# Patient Record
Sex: Male | Born: 1950 | Race: Black or African American | Hispanic: No | Marital: Married | Smoking: Former smoker
Health system: Southern US, Community
[De-identification: ages and names within clinical notes are randomized; demographics above are authoritative.]

## PROBLEM LIST (undated history)

## (undated) DIAGNOSIS — I10 Essential (primary) hypertension: Secondary | ICD-10-CM

## (undated) DIAGNOSIS — E119 Type 2 diabetes mellitus without complications: Secondary | ICD-10-CM

## (undated) DIAGNOSIS — I2510 Atherosclerotic heart disease of native coronary artery without angina pectoris: Secondary | ICD-10-CM

## (undated) DIAGNOSIS — E7800 Pure hypercholesterolemia, unspecified: Secondary | ICD-10-CM

## (undated) DIAGNOSIS — I219 Acute myocardial infarction, unspecified: Secondary | ICD-10-CM

## (undated) DIAGNOSIS — N289 Disorder of kidney and ureter, unspecified: Secondary | ICD-10-CM

## (undated) HISTORY — DX: Acute myocardial infarction, unspecified: I21.9

## (undated) HISTORY — PX: CORONARY ANGIOPLASTY WITH STENT PLACEMENT: SHX49

---

## 2003-08-01 HISTORY — PX: CORONARY ANGIOPLASTY WITH STENT PLACEMENT: SHX49

## 2017-04-21 ENCOUNTER — Emergency Department (HOSPITAL_COMMUNITY): Payer: Medicare Other

## 2017-04-21 ENCOUNTER — Other Ambulatory Visit: Payer: Self-pay

## 2017-04-21 ENCOUNTER — Emergency Department (HOSPITAL_COMMUNITY)
Admission: EM | Admit: 2017-04-21 | Discharge: 2017-04-21 | Disposition: A | Discharge status: Home / Self Care | Payer: Medicare Other | Attending: Emergency Medicine | Admitting: Emergency Medicine

## 2017-04-21 ENCOUNTER — Encounter (HOSPITAL_COMMUNITY): Payer: Self-pay | Admitting: Emergency Medicine

## 2017-04-21 DIAGNOSIS — R51 Headache: Secondary | ICD-10-CM | POA: Insufficient documentation

## 2017-04-21 DIAGNOSIS — R109 Unspecified abdominal pain: Secondary | ICD-10-CM | POA: Diagnosis not present

## 2017-04-21 DIAGNOSIS — E119 Type 2 diabetes mellitus without complications: Secondary | ICD-10-CM | POA: Diagnosis not present

## 2017-04-21 DIAGNOSIS — M549 Dorsalgia, unspecified: Secondary | ICD-10-CM | POA: Diagnosis present

## 2017-04-21 DIAGNOSIS — Z79899 Other long term (current) drug therapy: Secondary | ICD-10-CM | POA: Insufficient documentation

## 2017-04-21 DIAGNOSIS — I10 Essential (primary) hypertension: Secondary | ICD-10-CM | POA: Insufficient documentation

## 2017-04-21 DIAGNOSIS — Z7984 Long term (current) use of oral hypoglycemic drugs: Secondary | ICD-10-CM | POA: Diagnosis not present

## 2017-04-21 DIAGNOSIS — M545 Low back pain: Secondary | ICD-10-CM | POA: Insufficient documentation

## 2017-04-21 HISTORY — DX: Pure hypercholesterolemia, unspecified: E78.00

## 2017-04-21 HISTORY — DX: Essential (primary) hypertension: I10

## 2017-04-21 HISTORY — DX: Type 2 diabetes mellitus without complications: E11.9

## 2017-04-21 LAB — CBC
HCT: 45 % (ref 39.0–52.0)
Hemoglobin: 14.6 g/dL (ref 13.0–17.0)
MCH: 28.7 pg (ref 26.0–34.0)
MCHC: 32.4 g/dL (ref 30.0–36.0)
MCV: 88.6 fL (ref 78.0–100.0)
PLATELETS: 216 10*3/uL (ref 150–400)
RBC: 5.08 MIL/uL (ref 4.22–5.81)
RDW: 12.7 % (ref 11.5–15.5)
WBC: 13 10*3/uL — AB (ref 4.0–10.5)

## 2017-04-21 LAB — COMPREHENSIVE METABOLIC PANEL
ALT: 18 U/L (ref 17–63)
AST: 17 U/L (ref 15–41)
Albumin: 3.8 g/dL (ref 3.5–5.0)
Alkaline Phosphatase: 61 U/L (ref 38–126)
Anion gap: 13 (ref 5–15)
BUN: 17 mg/dL (ref 6–20)
CHLORIDE: 99 mmol/L — AB (ref 101–111)
CO2: 23 mmol/L (ref 22–32)
CREATININE: 0.94 mg/dL (ref 0.61–1.24)
Calcium: 9.6 mg/dL (ref 8.9–10.3)
Glucose, Bld: 388 mg/dL — ABNORMAL HIGH (ref 65–99)
Potassium: 4 mmol/L (ref 3.5–5.1)
Sodium: 135 mmol/L (ref 135–145)
Total Bilirubin: 0.9 mg/dL (ref 0.3–1.2)
Total Protein: 7.3 g/dL (ref 6.5–8.1)

## 2017-04-21 LAB — URINALYSIS, ROUTINE W REFLEX MICROSCOPIC
BILIRUBIN URINE: NEGATIVE
Bacteria, UA: NONE SEEN
KETONES UR: NEGATIVE mg/dL
LEUKOCYTES UA: NEGATIVE
Nitrite: NEGATIVE
PH: 5 (ref 5.0–8.0)
Protein, ur: 100 mg/dL — AB
RBC / HPF: NONE SEEN RBC/hpf (ref 0–5)
SQUAMOUS EPITHELIAL / LPF: NONE SEEN
Specific Gravity, Urine: 1.029 (ref 1.005–1.030)
WBC, UA: NONE SEEN WBC/hpf (ref 0–5)

## 2017-04-21 LAB — LIPASE, BLOOD: LIPASE: 40 U/L (ref 11–51)

## 2017-04-21 MED ORDER — AMOXICILLIN-POT CLAVULANATE 875-125 MG PO TABS: 1.0000 | ORAL_TABLET | Freq: Two times a day (BID) | ORAL | 0 refills | Status: DC

## 2017-04-21 MED ORDER — SODIUM CHLORIDE 0.9 % IV BOLUS (SEPSIS)
1000.0000 mL | Freq: Once | INTRAVENOUS | Status: AC
  Administered 2017-04-21: 1000 mL via INTRAVENOUS

## 2017-04-21 MED ORDER — ACETAMINOPHEN 325 MG PO TABS
650.0000 mg | ORAL_TABLET | Freq: Once | ORAL | Status: AC
  Administered 2017-04-21: 650 mg via ORAL
  Filled 2017-04-21: qty 2

## 2017-04-21 MED ORDER — HYDROCODONE-ACETAMINOPHEN 5-325 MG PO TABS: 1.0000 | ORAL_TABLET | Freq: Four times a day (QID) | ORAL | 0 refills | Status: DC | PRN

## 2017-04-21 MED ORDER — METRONIDAZOLE IN NACL 5-0.79 MG/ML-% IV SOLN
500.0000 mg | Freq: Once | INTRAVENOUS | Status: AC
  Administered 2017-04-21: 500 mg via INTRAVENOUS
  Filled 2017-04-21: qty 100

## 2017-04-21 MED ORDER — CIPROFLOXACIN IN D5W 400 MG/200ML IV SOLN
400.0000 mg | Freq: Once | INTRAVENOUS | Status: AC
  Administered 2017-04-21: 400 mg via INTRAVENOUS
  Filled 2017-04-21: qty 200

## 2017-04-21 MED ORDER — IOPAMIDOL (ISOVUE-300) INJECTION 61%
INTRAVENOUS | Status: AC
  Administered 2017-04-21: 100 mL
  Filled 2017-04-21: qty 100

## 2017-04-21 NOTE — ED Provider Notes (Signed)
[facility] Provider Note   CSN: [id number] Arrival date & time: 04/21/17  0754     History   Chief Complaint Chief Complaint  Patient presents with  . Back Pain  . Abdominal Pain  . Headache    HPI [name] is a 66 y.o. male.  The history is provided by the patient.  Back Pain   This is a new problem. The current episode started more than 1 week ago. The problem occurs constantly. The problem has been gradually worsening. The pain is associated with no known injury. The pain is mild. Associated symptoms include headaches and abdominal pain. He has tried nothing for the symptoms. The treatment provided no relief.  Abdominal Pain   Associated symptoms include headaches.  Headache      Past Medical History:  Diagnosis Date  . Diabetes mellitus without complication ([place])   . Hypercholesterolemia   . Hypertension     There are no active problems to display for this patient.   History reviewed. No pertinent surgical history.     Home Medications    Prior to Admission medications   Medication Sig Start Date End Date Taking? Authorizing Provider  glipiZIDE (GLUCOTROL XL) 5 MG 24 hr tablet Take 5 mg by mouth daily. 04/07/17  Yes [provider]  metFORMIN (GLUCOPHAGE) 1000 MG tablet Take 1,000 mg by mouth 2 (two) times daily. 02/17/17  Yes [provider]  omeprazole (PRILOSEC) 20 MG capsule Take 20 mg by mouth daily as needed (acid reflux).   Yes [provider]  quinapril (ACCUPRIL) 40 MG tablet Take 40 mg by mouth 2 (two) times daily. 04/07/17  Yes [provider]  rosuvastatin (CRESTOR) 20 MG tablet Take 20 mg by mouth daily. 04/07/17  Yes [provider]  amoxicillin-clavulanate (AUGMENTIN) 875-125 MG tablet Take 1 tablet by mouth 2 (two) times daily. One po bid x 7 days 04/21/17   Zakaiya Lares, Corene Cornea, MD  HYDROcodone-acetaminophen (NORCO/VICODIN) 5-325 MG tablet Take 1-2 tablets by  mouth every 6 (six) hours as needed for severe pain. 04/21/17   Lev Cervone, Corene Cornea, MD    Family History No family history on file.  Social History Social History   Tobacco Use  . Smoking status: Never Smoker  . Smokeless tobacco: Never Used  Substance Use Topics  . Alcohol use: No    Frequency: Never  . Drug use: No     Allergies   Patient has no allergy information on record.   Review of Systems Review of Systems  Gastrointestinal: Positive for abdominal pain.  Musculoskeletal: Positive for back pain.  Neurological: Positive for headaches.  All other systems reviewed and are negative.    Physical Exam Updated Vital Signs BP (!) 159/83   Pulse 87   Temp (!) 100.4 F (38 C) (Rectal)   Resp 16   Ht 5\' 4"  (1.626 m)   Wt 86.2 kg (190 lb)   SpO2 98%   BMI 32.61 kg/m   Physical Exam  Constitutional: He appears well-developed and well-nourished.  HENT:  Head: Normocephalic and atraumatic.  Eyes: EOM are normal. Pupils are equal, round, and reactive to light.  Neck: Normal range of motion.  Cardiovascular: Normal rate.  Pulmonary/Chest: Effort normal. No respiratory distress.  Abdominal: Normal appearance. He exhibits no distension. There is tenderness in the right lower quadrant, suprapubic area and left lower quadrant. There is CVA tenderness. There is no rigidity, no rebound and no guarding.  Musculoskeletal: Normal range of  motion.  Neurological: He is alert.  Skin: Skin is warm and dry.  Nursing note and vitals reviewed.    ED Treatments / Results  Labs (all labs ordered are listed, but only abnormal results are displayed) Labs Reviewed  COMPREHENSIVE METABOLIC PANEL - Abnormal; Notable for the following components:      Result Value   Chloride 99 (*)    Glucose, Bld 388 (*)    All other components within normal limits  CBC - Abnormal; Notable for the following components:   WBC 13.0 (*)    All other components within normal limits  URINALYSIS,  ROUTINE W REFLEX MICROSCOPIC - Abnormal; Notable for the following components:   Color, Urine STRAW (*)    Glucose, UA >=500 (*)    Hgb urine dipstick SMALL (*)    Protein, ur 100 (*)    All other components within normal limits  LIPASE, BLOOD    EKG  EKG Interpretation None       Radiology Ct Abdomen Pelvis W Contrast  Result Date: 04/21/2017 CLINICAL DATA:  Back pain, abdominal pain and headache EXAM: CT ABDOMEN AND PELVIS WITH CONTRAST TECHNIQUE: Multidetector CT imaging of the abdomen and pelvis was performed using the standard protocol following bolus administration of intravenous contrast. CONTRAST:  128mL ISOVUE-300 IOPAMIDOL (ISOVUE-300) INJECTION 61% COMPARISON:  None. FINDINGS: Lower chest: No pulmonary nodules or pleural effusion. No visible pericardial effusion. Hepatobiliary: Normal hepatic contours and density. No visible biliary dilatation. Normal gallbladder. Pancreas: Normal contours without ductal dilatation. No peripancreatic fluid collection. Spleen: Normal. Adrenals/Urinary Tract: --Adrenal glands: Normal. --Right kidney/ureter: No hydronephrosis or perinephric stranding. No nephrolithiasis. No obstructing ureteral stones. --Left kidney/ureter: No hydronephrosis or perinephric stranding. No nephrolithiasis. No obstructing ureteral stones. --Urinary bladder: Unremarkable. Stomach/Bowel: --Stomach/Duodenum: No hiatal hernia or other gastric abnormality. Normal duodenal course and caliber. --Small bowel: No dilatation or inflammation. --Colon: No focal abnormality. --Appendix: Normal. Vascular/Lymphatic: Atherosclerotic calcification is present within the non-aneurysmal abdominal aorta, without hemodynamically significant stenosis. No abdominal or pelvic lymphadenopathy. Reproductive: Normal prostate and seminal vesicles. Musculoskeletal. No bony spinal canal stenosis or focal osseous abnormality. Other: Small fat-containing right inguinal hernia. IMPRESSION: 1. No acute  abnormality of the abdomen or pelvis. 2.  Aortic Atherosclerosis (ICD10-I70.0). Electronically Signed   By: Ulyses Jarred M.D.   On: 04/21/2017 13:54    Procedures Procedures (including critical care time)  Medications Ordered in ED Medications  sodium chloride 0.9 % bolus 1,000 mL (0 mLs Intravenous Stopped 04/21/17 1223)  acetaminophen (TYLENOL) tablet 650 mg (650 mg Oral Given 04/21/17 1125)  ciprofloxacin (CIPRO) IVPB 400 mg (0 mg Intravenous Stopped 04/21/17 1225)  metroNIDAZOLE (FLAGYL) IVPB 500 mg (0 mg Intravenous Stopped 04/21/17 1224)  iopamidol (ISOVUE-300) 61 % injection (100 mLs  Contrast Given 04/21/17 1334)     Initial Impression / Assessment and Plan / ED Course  I have reviewed the triage vital signs and the nursing notes.  Pertinent labs & imaging results that were available during my care of the patient were reviewed by me and considered in my medical decision making (see chart for details).    66 year old male with 2-3 weeks of progressively worsening back pain abdominal pain.  Intermittent headaches which does not seem to be his main complaint here today.  He states he has right flank pain that radiates around to his abdomen.  Also notes that he has a protrusion in his abdomen when he walks or when he sits up.  On exam appears that he actually has  diastases recti no evidence of hernia however CT scan was done to evaluate since he also had a low-grade fever of 100.4.  Antibiotics given and will continue those at home as well if no no acute cause of his fever was identified.  He will need to follow-up with primary doctor.  He will also return here if any worsening symptoms or not improving.  Consider possible osteomyelitis, discitis, epidural abscess, or transverse myelitis but patient does not have any neurologic changes does not really have midline back pain however if his symptoms are improving he may need a MRI of his back to evaluate for these further but no indication  for that at this time.  Final Clinical Impressions(s) / ED Diagnoses   Final diagnoses:  Low back pain, unspecified back pain laterality, unspecified chronicity, with sciatica presence unspecified    ED Discharge Orders        Ordered    amoxicillin-clavulanate (AUGMENTIN) 875-125 MG tablet  2 times daily     04/21/17 1514    HYDROcodone-acetaminophen (NORCO/VICODIN) 5-325 MG tablet  Every 6 hours PRN     04/21/17 1515       Kaisen Ackers, Corene Cornea, MD 04/21/17 1652

## 2017-04-21 NOTE — ED Triage Notes (Signed)
Pt. Stated, ive had back pain, stomach pain like a knot in there and a headache for 2 weeks.

## 2017-04-21 NOTE — ED Notes (Signed)
Pt called out c/o "rash and itching" to RFA above his IV while receiving both antibiotics. RN went to assess and saw small bumps but no discoloration present. Antibiotics already completed at this point. Disconnected from pump and line flushed with saline. Denies any sob or throat itching, sats 99% on RA. Will notify MD.

## 2017-06-07 ENCOUNTER — Encounter: Payer: Self-pay | Admitting: Family Medicine

## 2017-06-07 ENCOUNTER — Ambulatory Visit (INDEPENDENT_AMBULATORY_CARE_PROVIDER_SITE_OTHER): Payer: Medicare Other | Admitting: Family Medicine

## 2017-06-07 ENCOUNTER — Other Ambulatory Visit: Payer: Self-pay

## 2017-06-07 VITALS — BP 158/90 | HR 100 | Temp 98.6°F | Ht 64.0 in | Wt 190.0 lb

## 2017-06-07 DIAGNOSIS — E7800 Pure hypercholesterolemia, unspecified: Secondary | ICD-10-CM | POA: Diagnosis not present

## 2017-06-07 DIAGNOSIS — Z955 Presence of coronary angioplasty implant and graft: Secondary | ICD-10-CM | POA: Diagnosis not present

## 2017-06-07 DIAGNOSIS — E119 Type 2 diabetes mellitus without complications: Secondary | ICD-10-CM | POA: Diagnosis not present

## 2017-06-07 DIAGNOSIS — I10 Essential (primary) hypertension: Secondary | ICD-10-CM | POA: Diagnosis not present

## 2017-06-07 NOTE — Progress Notes (Signed)
Subjective:    Patient ID: [name], male    DOB: 12-08-1950, 67 y.o.   MRN: [id number]   CC: new patient encounter  HPI:  HTN: Quinapril 40 mg BID- does not believe this is working as well because he has had higher pressures once switched to generic medications.  - no HA, change in vision  HLD: Rosuvastatin 20mg  daily Reports cholesterol as ok at last office visit but was high previously  Diabetes:  Last A1c unknown- does not think he has ever had one- says he has had diabetes for maybe 20 years, he is unsure  Taking medications: metformin 1000 mg BID, glipizide 5mg  daily On Aspirin, and on statin Last eye exam: recently- placed referral Last foot exam: will need to perform at upcoming visit ROS: denies fever, chills, dizziness, diaphoresis, LOC, polyuria, polydipsia Colonoscopy 6 months ago- normal, 10 years from now  Recent ER visit for Pancreatitis: - patient unable to initially remember name of illness diagnosed in ED. Recorded as low back pain and sciatica on note, but per patient he has had no back pain and it was stomach pain - he was given flagyl and augmentin and norco for pain and prilosec as well - He describes it as epigastric pain that radiated to his back - Improved now and has no discomfort, finished his medications to treat pancreatitis  Review of Systems  Constitutional: Negative for chills and fever.  HENT: Negative for congestion.   Eyes: Negative for blurred vision and double vision.  Respiratory: Negative for shortness of breath.   Cardiovascular: Negative for chest pain and leg swelling.  Gastrointestinal: Negative for abdominal pain, constipation, diarrhea and vomiting.  Genitourinary: Negative for dysuria.  Musculoskeletal: Negative for back pain.  Neurological: Negative for dizziness, weakness and headaches.  Psychiatric/Behavioral: Negative for depression.   NKDA.  Social Hx: Smoking status reviewed: quit smoking 15 years ago. Denies  alcohol, drug use. Retired Retail buyer. Recently moved from [place]. Walks when weather is good and tries to be active.  Medical Hx: Diabetes Type II, Hypertension, Hyperlipidemia, h/o MI with stent placement in 2004/2005  Surgical Hx: h/o MI with stent placement in 2004/2005  Family History  Problem Relation Age of Onset  . Heart disease Mother   . Diabetes Mother   . Hypertension Mother   . Alcoholism Father   . Heart disease Father   . Hypertension Sister   . Diabetes Brother   . Hypercholesterolemia Brother   . Osteoporosis Brother   . Hypertension Brother   . Kidney disease Brother    Objective:  BP (!) 158/90   Pulse 100   Temp 98.6 F (37 C) (Oral)   Ht 5\' 4"  (1.626 m)   Wt 190 lb (86.2 kg)   SpO2 96%   BMI 32.61 kg/m  Vitals and nursing note reviewed  General: NAD, pleasant Head: atraumatic Neck: supple, FROM Cardiac: RRR, normal heart sounds, no murmurs Respiratory: CTAB, normal effort Abdomen: soft, nontender, nondistended. Bowel sounds present Extremities: no edema or cyanosis. WWP. 2+ pulses dorsalis pedis MSK: normal gait, no low back tenderness and FROM of lumbar spine Skin: warm and dry, no rashes noted Neuro: alert and oriented, no focal deficits Psych: normal affect   Lipid Panel     Component Value Date/Time   CHOL 246 (H) 06/07/2017 1603   TRIG 124 06/07/2017 1603   HDL 77 06/07/2017 1603   CHOLHDL 3.2 06/07/2017 1603   LDLCALC 144 (H) 06/07/2017 1603   Hemoglobin A1c 14.3  Assessment & Plan:   H/O heart artery stent Patient previously followed closely by cardiology in [place]. Reports that he would like to follow one here. He reports he needs a test per his last visit but is unsure what this is.  Will refer to Cardiology and attempt to obtain previous records.  Pure hypercholesterolemia Patient to continue his current statin therapy. Lab results as follows:  Lipid Panel     Component Value Date/Time   CHOL 246 (H) 06/07/2017 1603   TRIG  124 06/07/2017 1603   HDL 77 06/07/2017 1603   CHOLHDL 3.2 06/07/2017 1603   LDLCALC 144 (H) 06/07/2017 1603   Attempted to call patient to schedule appointment ina  Week to discuss this.   Type 2 diabetes mellitus without complication, without long-term current use of insulin ([facility]) Obtained A1c which returned on 2/14 as 14.3. Attempted to call patient to see him next week to discuss medication adjustments. Patient does not recall ever having A1c although he has had diabetes for many years. Will also schedule an appointment with Dr. Valentina Lucks for further diabetes management.   Continue taking Metformin and glipizide for now. Will adjust at next appointment.   Essential hypertension, benign Patient with longstanding history of hypertension and reports his medication was recently changed to an off-brand medication. Elevated today to 158/90. Will follow closely and recheck this at next appointment.   Recent ED visit with Likely Pancreatitis Patient recently seen for what appears to be pancreatitis, although documentation does not state this, but per patient he went for abdominal pain and was given flagyl and augmentin with norco and prilosec. Documented he had sciatic, but patient denies any back pain or leg pain or hip pain. Reports his stomach pain has resolved and he is improved.   [place] Skylar Flynt, DO Family Medicine Resident PGY-1

## 2017-06-07 NOTE — Patient Instructions (Signed)
Thank you for coming to see me today. It was a pleasure meeting you! Today we talked about:   Your diabetes. I will collect lab work today to check to see how this is doing.   Your high blood pressure. We will re-check this at your next visit before changing medications.   Your cholesterol. We will check that today as well and I will call you with results.   Please follow-up with me in less than 1 month.  I have referred you to an eye doctor and a cardiologist.  If you have any questions or concerns, please do not hesitate to call the office at [phone].  Take Care,   [place] Dale Strausser, DO

## 2017-06-08 LAB — LIPID PANEL
Chol/HDL Ratio: 3.2 ratio (ref 0.0–5.0)
Cholesterol, Total: 246 mg/dL — ABNORMAL HIGH (ref 100–199)
HDL: 77 mg/dL (ref >39)
LDL CALC: 144 mg/dL — AB (ref 0–99)
Triglycerides: 124 mg/dL (ref 0–149)
VLDL CHOLESTEROL CAL: 25 mg/dL (ref 5–40)

## 2017-06-08 LAB — HEMOGLOBIN A1C
ESTIMATED AVERAGE GLUCOSE: 364 mg/dL
HEMOGLOBIN A1C: 14.3 % — AB (ref 4.8–5.6)

## 2017-06-09 ENCOUNTER — Telehealth: Payer: Self-pay | Admitting: Family Medicine

## 2017-06-09 ENCOUNTER — Encounter: Payer: Self-pay | Admitting: Family Medicine

## 2017-06-09 DIAGNOSIS — E119 Type 2 diabetes mellitus without complications: Secondary | ICD-10-CM | POA: Insufficient documentation

## 2017-06-09 DIAGNOSIS — E7800 Pure hypercholesterolemia, unspecified: Secondary | ICD-10-CM | POA: Insufficient documentation

## 2017-06-09 DIAGNOSIS — I10 Essential (primary) hypertension: Secondary | ICD-10-CM | POA: Insufficient documentation

## 2017-06-09 DIAGNOSIS — Z955 Presence of coronary angioplasty implant and graft: Secondary | ICD-10-CM | POA: Insufficient documentation

## 2017-06-09 NOTE — Assessment & Plan Note (Signed)
Patient to continue his current statin therapy. Lab results as follows:  Lipid Panel     Component Value Date/Time   CHOL 246 (H) 06/07/2017 1603   TRIG 124 06/07/2017 1603   HDL 77 06/07/2017 1603   CHOLHDL 3.2 06/07/2017 1603   LDLCALC 144 (H) 06/07/2017 1603   Attempted to call patient to schedule appointment ina  Week to discuss this.

## 2017-06-09 NOTE — Telephone Encounter (Signed)
Attempted to call patient and inform him of his A1c of 14.3 and high level of cholesterol. Left message on wife's voicemail (that was only phone number provided) informing him to schedule an appointment next week. We will need to further discuss his hypertension, high cholesterol, and diabetes and change his current medication therapy.

## 2017-06-09 NOTE — Assessment & Plan Note (Signed)
Patient previously followed closely by cardiology in [place]. Reports that he would like to follow one here. He reports he needs a test per his last visit but is unsure what this is.  Will refer to Cardiology and attempt to obtain previous records.

## 2017-06-09 NOTE — Assessment & Plan Note (Signed)
Patient with longstanding history of hypertension and reports his medication was recently changed to an off-brand medication. Elevated today to 158/90. Will follow closely and recheck this at next appointment.

## 2017-06-09 NOTE — Assessment & Plan Note (Signed)
Obtained A1c which returned on 2/14 as 14.3. Attempted to call patient to see him next week to discuss medication adjustments. Patient does not recall ever having A1c although he has had diabetes for many years. Will also schedule an appointment with Dr. Valentina Lucks for further diabetes management.   Continue taking Metformin and glipizide for now. Will adjust at next appointment.

## 2017-06-13 ENCOUNTER — Other Ambulatory Visit: Payer: Self-pay | Admitting: Family Medicine

## 2017-06-13 MED ORDER — QUINAPRIL HCL 40 MG PO TABS: 40.0000 mg | ORAL_TABLET | Freq: Two times a day (BID) | ORAL | 0 refills | Status: DC

## 2017-06-13 MED ORDER — METFORMIN HCL 1000 MG PO TABS: 1000.0000 mg | ORAL_TABLET | Freq: Two times a day (BID) | ORAL | 0 refills | Status: DC

## 2017-06-13 MED ORDER — GLIPIZIDE ER 5 MG PO TB24: 5.0000 mg | ORAL_TABLET | Freq: Every day | ORAL | 0 refills | Status: DC

## 2017-06-13 MED ORDER — ROSUVASTATIN CALCIUM 20 MG PO TABS: 20.0000 mg | ORAL_TABLET | Freq: Every day | ORAL | 0 refills | Status: DC

## 2017-06-13 NOTE — Telephone Encounter (Signed)
This patient was seen for his new patient appointment last Wednesday. He brought in his bag of medications and hasn't been able to find them since and needs his medications. He called to see if anyone turned them in at the office but we do not have them. He would like all his meds sent to the [facility] on [address] so he can start back on his meds

## 2017-06-20 ENCOUNTER — Encounter: Payer: Self-pay | Admitting: Family Medicine

## 2017-06-20 ENCOUNTER — Other Ambulatory Visit: Payer: Self-pay

## 2017-06-20 ENCOUNTER — Ambulatory Visit (INDEPENDENT_AMBULATORY_CARE_PROVIDER_SITE_OTHER): Payer: Medicare Other | Admitting: Family Medicine

## 2017-06-20 DIAGNOSIS — I10 Essential (primary) hypertension: Secondary | ICD-10-CM

## 2017-06-20 DIAGNOSIS — E119 Type 2 diabetes mellitus without complications: Secondary | ICD-10-CM

## 2017-06-20 MED ORDER — HYDROCHLOROTHIAZIDE 12.5 MG PO TABS: 12.5000 mg | ORAL_TABLET | Freq: Every day | ORAL | 3 refills | Status: DC

## 2017-06-20 MED ORDER — AMLODIPINE BESYLATE 5 MG PO TABS
5.0000 mg | ORAL_TABLET | Freq: Every day | ORAL | 3 refills | Status: DC
Start: 2017-06-20 — End: 2018-03-02

## 2017-06-20 MED ORDER — CANAGLIFLOZIN 100 MG PO TABS: 100.0000 mg | ORAL_TABLET | Freq: Every day | ORAL | 3 refills | Status: DC

## 2017-06-20 NOTE — Patient Instructions (Signed)
Thank you for coming to see me today. It was a pleasure! I'm glad you are enjoying [place]! Today we talked about:   Your diabetes. I have started you on a medication canagliflozin (invokana) please let me know if this is too expensive. Please start checking your blood sugars daily.   Your high blood pressure. I have started you on Norvasc 5 mg daily. Please return in 1 month to check your blood pressure again.   Please follow up with your cardiologist.  Please follow-up with me in 1 month or as needed.  If you have any questions or concerns, please do not hesitate to call the office at [phone].  Take Care,   [place] Hester Forget, DO  Diet Recommendations for Diabetes: Carbohydrate includes starch, sugar, and fiber.  Of these, only sugar and starch raise blood glucose.  (Fiber is found in fruits, vegetables [especially skin, seeds, and stalks] and whole grains.)   Starchy (carb) foods: Bread, rice, pasta, potatoes, corn, cereal, grits, crackers, bagels, muffins, all baked goods.  (Fruit, milk, and yogurt also have carbohydrate, but most of these foods will not spike your blood sugar as most starchy foods will.)  A few fruits do cause high blood sugars; use small portions of bananas (limit to 1/2 at a time), grapes, watermelon, oranges, and most tropical fruits.   Protein foods: Meat, fish, poultry, eggs, dairy foods, and beans such as pinto and kidney beans (beans also provide carbohydrate).   1. Eat at least REAL 3 meals and 1-2 snacks per day. Never go more than 4-5 hours while awake without eating. Eat breakfast within the first hour of getting up.   2. Limit starchy foods to TWO per meal and ONE per snack. ONE portion of a starchy  food is equal to the following:   - ONE slice of bread (or its equivalent, such as half of a hamburger bun).   - 1/2 cup of a "scoopable" starchy food such as potatoes or rice.   - 15 grams of Total Carbohydrate as shown on food label.  3. Include at  every meal: a protein food, a carb food, and vegetables and/or fruit.   - Obtain twice the volume of veg's as protein or carbohydrate foods for both lunch and dinner.   - Fresh or frozen veg's are best.   - Keep frozen veg's on hand for a quick vegetable serving.     Canagliflozin oral tablets What is this medicine? CANAGLIFLOZIN (KAN a gli FLOE zin) helps to treat type 2 diabetes. It helps to control blood sugar. Treatment is combined with diet and exercise. This medicine may be used for other purposes; ask your health care provider or pharmacist if you have questions. COMMON BRAND NAME(S): Invokana What should I tell my health care provider before I take this medicine? They need to know if you have any of these conditions: -artery disease -dehydration -diabetic ketoacidosis -diet low in salt -eating less due to illness, surgery, dieting, or any other reason -foot sores -having surgery -high cholesterol -high levels of potassium in the blood -history of amputation -history of pancreatitis or pancreas problems -history of yeast infection of the penis or vagina -if you often drink alcohol -infections in the bladder, kidneys, or urinary tract -kidney disease -liver disease -low blood pressure -nerve damage -on hemodialysis -problems urinating -type 1 diabetes -uncircumcised male -an unusual or allergic reaction to canagliflozin, other medicines, foods, dyes, or preservatives -pregnant or trying to get pregnant -breast-feeding How should I  use this medicine? Take this medicine by mouth with a glass of water. Follow the directions on the prescription label. Take it before the first meal of the day. Take your dose at the same time each day. Do not take more often than directed. Do not stop taking except on your doctor's advice. A special MedGuide will be given to you by the pharmacist with each prescription and refill. Be sure to read this information carefully each time. Talk to  your pediatrician regarding the use of this medicine in children. Special care may be needed. Overdosage: If you think you have taken too much of this medicine contact a poison control center or emergency room at once. NOTE: This medicine is only for you. Do not share this medicine with others. What if I miss a dose? If you miss a dose, take it as soon as you can. If it is almost time for your next dose, take only that dose. Do not take double or extra doses. What may interact with this medicine? Do not take this medicine with any of the following medications: -gatifloxacin This medicine may also interact with the following medications: -alcohol -certain medicines for blood pressure, heart disease -digoxin -diuretics -insulin -nateglinide -phenobarbital -phenytoin -repaglinide -rifampin -ritonavir -sulfonylureas like glimepiride, glipizide, glyburide This list may not describe all possible interactions. Give your health care provider a list of all the medicines, herbs, non-prescription drugs, or dietary supplements you use. Also tell them if you smoke, drink alcohol, or use illegal drugs. Some items may interact with your medicine. What should I watch for while using this medicine? Visit your doctor or health care professional for regular checks on your progress. This medicine can cause a serious condition in which there is too much acid in the blood. If you develop nausea, vomiting, stomach pain, unusual tiredness, or breathing problems, stop taking this medicine and call your doctor right away. If possible, use a ketone dipstick to check for ketones in your urine. A test called the HbA1C (A1C) will be monitored. This is a simple blood test. It measures your blood sugar control over the last 2 to 3 months. You will receive this test every 3 to 6 months. Learn how to check your blood sugar. Learn the symptoms of low and high blood sugar and how to manage them. Always carry a quick-source  of sugar with you in case you have symptoms of low blood sugar. Examples include hard sugar candy or glucose tablets. Make sure others know that you can choke if you eat or drink when you develop serious symptoms of low blood sugar, such as seizures or unconsciousness. They must get medical help at once. Tell your doctor or health care professional if you have high blood sugar. You might need to change the dose of your medicine. If you are sick or exercising more than usual, you might need to change the dose of your medicine. Do not skip meals. Ask your doctor or health care professional if you should avoid alcohol. Many nonprescription cough and cold products contain sugar or alcohol. These can affect blood sugar. Wear a medical ID bracelet or chain, and carry a card that describes your disease and details of your medicine and dosage times. What side effects may I notice from receiving this medicine? Side effects that you should report to your doctor or health care professional as soon as possible: -allergic reactions like skin rash, itching or hives, swelling of the face, lips, or tongue -breathing problems -chest  pain -dizziness -fast or irregular heartbeat -feeling faint or lightheaded, falls -muscle weakness -nausea, vomiting, unusual stomach upset or pain -new pain or tenderness, change in skin color, sores or ulcers, or infection in legs or feet -signs and symptoms of low blood sugar such as feeling anxious, confusion, dizziness, increased hunger, unusually weak or tired, sweating, shakiness, cold, irritable, headache, blurred vision, fast heartbeat, loss of consciousness -signs and symptoms of a urinary tract infection, such as fever, chills, a burning feeling when urinating, blood in the urine, back pain -trouble passing urine or change in the amount of urine, including an urgent need to urinate more often, in larger amounts, or at night -penile discharge, itching, or pain in men -unusual  tiredness -vaginal discharge, itching, or odor in women Side effects that usually do not require medical attention (report to your doctor or health care professional if they continue or are bothersome): -constipation -mild increase in urination -thirsty This list may not describe all possible side effects. Call your doctor for medical advice about side effects. You may report side effects to FDA at 1-800-FDA-1088. Where should I keep my medicine? Keep out of the reach of children. Store at room temperature between 20 and 25 degrees C (68 and 77 degrees F). Throw away any unused medicine after the expiration date. NOTE: This sheet is a summary. It may not cover all possible information. If you have questions about this medicine, talk to your doctor, pharmacist, or health care provider.  2018 Elsevier/Gold Standard (2015-09-08 14:17:20)

## 2017-06-20 NOTE — Progress Notes (Signed)
   Subjective:    Patient ID: [name], male    DOB: [birth year], 67 y.o.   MRN: [id number]   CC:  HPI:  Diabetes:  Last A1c 14.3 on 06/07/17 Taking medications: metformin 1000mg  BID, glipizide 5mg  dialy Not taking Aspirin, but on statin Last eye exam: patient has appointment scheduled ROS: denies fever, chills, dizziness, diaphoresis, LOC, polyuria, polydipsia Going to [place] tomorrow to get the rest of his things, including his glucose monitor  Hypertension: - Medications: quinapril 40mg  BID - Compliance: yes - Checking BP at home: no - Denies any SOB, CP, vision changes, LE edema, medication SEs, or symptoms of hypotension - Diet: tries to limit salt  - Exercise: walks daily and tries to stay active - to see cardiologist here in April  Smoking status reviewed  Review of Systems Per HPI, else denies recent illness, fever, headache, changes in vision, chest pain, shortness of breath, abdominal pain, N/V/D, weakness  Patient Active Problem List   Diagnosis Date Noted  . Type 2 diabetes mellitus without complication, without long-term current use of insulin ([place]) 06/09/2017  . Pure hypercholesterolemia 06/09/2017  . H/O heart artery stent 06/09/2017  . Essential hypertension, benign 06/09/2017    Objective:  BP (!) 156/82   Pulse 97   Temp 98.3 F (36.8 C) (Oral)   Wt 194 lb (88 kg)   SpO2 97%   BMI 33.30 kg/m  Vitals and nursing note reviewed  General: NAD, pleasant Cardiac: RRR, normal heart sounds, no murmurs Respiratory: CTAB, normal effort Abdomen: soft, nontender, nondistended. Bowel sounds present Extremities: no edema or cyanosis. WWP. Skin: warm and dry, no rashes noted Neuro: alert and oriented, no focal deficits Psych: normal affect   Assessment & Plan:    Type 2 diabetes mellitus without complication, without long-term current use of insulin (H[facility]) Patient's A1c of 14.8 at last visit. Will start patient on Jardiance. Previously on Trulicity but  patient developed pancreatitis. Given handout on some diabetic diet choices and counseled on healthier options. Patient to start checking his blood glucose at home and will begin to record and is to return in 1 month after starting Jardiance.  Essential hypertension, benign Patient on quinapril 40mg  BID at highest dose, will start patient on Norvasc 5mg  daily.   Previously on HCTZ but this was stopped when patient had pancreatitis as a possible cause. Doubt this medication was cause of pancreatitis due to him being on HCTZ for years prior to pancreatitis and only a couple of months of Trulicity, but will continue to hold HCTZ for this reason.   [place] Danaka Llera, DO Family Medicine Resident PGY-1

## 2017-06-21 MED ORDER — EMPAGLIFLOZIN 10 MG PO TABS: 10.0000 mg | ORAL_TABLET | Freq: Every day | ORAL | 3 refills | Status: DC

## 2017-06-22 NOTE — Assessment & Plan Note (Signed)
Patient's A1c of 14.8 at last visit. Will start patient on Jardiance. Previously on Trulicity but patient developed pancreatitis. Given handout on some diabetic diet choices and counseled on healthier options. Patient to start checking his blood glucose at home and will begin to record and is to return in 1 month after starting Jardiance.

## 2017-06-22 NOTE — Assessment & Plan Note (Signed)
Patient on quinapril 40mg  BID at highest dose, will start patient on Norvasc 5mg  daily.   Previously on HCTZ but this was stopped when patient had pancreatitis as a possible cause. Doubt this medication was cause of pancreatitis due to him being on HCTZ for years prior to pancreatitis and only a couple of months of Trulicity, but will continue to hold HCTZ for this reason.

## 2017-07-18 ENCOUNTER — Encounter: Payer: Self-pay | Admitting: Cardiovascular Disease

## 2017-07-20 ENCOUNTER — Other Ambulatory Visit: Payer: Self-pay

## 2017-07-20 MED ORDER — EMPAGLIFLOZIN 10 MG PO TABS
10.0000 mg | ORAL_TABLET | Freq: Every day | ORAL | 2 refills | Status: DC
Start: 2017-07-20 — End: 2018-03-02

## 2017-07-28 ENCOUNTER — Ambulatory Visit: Payer: Medicare Other | Admitting: Cardiovascular Disease

## 2017-07-31 ENCOUNTER — Other Ambulatory Visit: Payer: Self-pay

## 2017-08-01 MED ORDER — METFORMIN HCL 1000 MG PO TABS: 1000.0000 mg | ORAL_TABLET | Freq: Two times a day (BID) | ORAL | 0 refills | Status: DC

## 2017-08-01 NOTE — Progress Notes (Deleted)
   Subjective:    Patient ID: [name], male    DOB: 1950-09-04, 67 y.o.   MRN: [id number]   CC:  HPI:  Diabetes:  Last A1c *** on *** Taking medications: metformin *** On Aspirin, and on statin Last eye exam: recently *** Last foot exam: up to date ROS: denies fever, chills, dizziness, diaphoresis, LOC, polyuria, polydipsia  Hypertension: - Medications: *** - Compliance: *** - Checking BP at home: *** - Denies any SOB, CP, vision changes, LE edema, medication SEs, or symptoms of hypotension - Diet: *** - Exercise: ***  Smoking status reviewed  Review of Systems Per HPI, else denies recent illness, fever, headache, changes in vision, chest pain, shortness of breath, abdominal pain, N/V/D, weakness   Patient Active Problem List   Diagnosis Date Noted  . Type 2 diabetes mellitus without complication, without long-term current use of insulin ([place]) 06/09/2017  . Pure hypercholesterolemia 06/09/2017  . H/O heart artery stent 06/09/2017  . Essential hypertension, benign 06/09/2017     Objective:  There were no vitals taken for this visit. Vitals and nursing note reviewed  General: NAD, pleasant Cardiac: RRR, normal heart sounds, no murmurs Respiratory: CTAB, normal effort Abdomen: soft, nontender, nondistended. Bowel sounds present Extremities: no edema or cyanosis. WWP. Skin: warm and dry, no rashes noted Neuro: alert and oriented, no focal deficits Psych: normal affect   Assessment & Plan:    No problem-specific Assessment & Plan notes found for this encounter.    [place] Rich Paprocki, DO Family Medicine Resident PGY-1

## 2017-08-02 ENCOUNTER — Ambulatory Visit: Payer: Medicare Other | Admitting: Family Medicine

## 2017-08-17 ENCOUNTER — Ambulatory Visit: Payer: Medicare Other | Admitting: Family Medicine

## 2017-09-11 ENCOUNTER — Ambulatory Visit: Payer: Medicare Other | Admitting: Cardiovascular Disease

## 2017-09-13 ENCOUNTER — Ambulatory Visit: Payer: Medicare Other | Admitting: Cardiovascular Disease

## 2017-12-18 ENCOUNTER — Ambulatory Visit: Payer: Medicare Other | Admitting: Cardiovascular Disease

## 2018-02-23 ENCOUNTER — Emergency Department (HOSPITAL_COMMUNITY)
Admission: EM | Admit: 2018-02-23 | Discharge: 2018-02-23 | Disposition: A | Discharge status: Home / Self Care | Payer: Medicare Other | Attending: Emergency Medicine | Admitting: Emergency Medicine

## 2018-02-23 ENCOUNTER — Encounter (HOSPITAL_COMMUNITY): Payer: Self-pay | Admitting: Emergency Medicine

## 2018-02-23 DIAGNOSIS — I252 Old myocardial infarction: Secondary | ICD-10-CM | POA: Insufficient documentation

## 2018-02-23 DIAGNOSIS — Z87891 Personal history of nicotine dependence: Secondary | ICD-10-CM | POA: Diagnosis not present

## 2018-02-23 DIAGNOSIS — Z7984 Long term (current) use of oral hypoglycemic drugs: Secondary | ICD-10-CM | POA: Diagnosis not present

## 2018-02-23 DIAGNOSIS — I10 Essential (primary) hypertension: Secondary | ICD-10-CM

## 2018-02-23 DIAGNOSIS — Z79899 Other long term (current) drug therapy: Secondary | ICD-10-CM | POA: Insufficient documentation

## 2018-02-23 DIAGNOSIS — E1165 Type 2 diabetes mellitus with hyperglycemia: Secondary | ICD-10-CM | POA: Diagnosis not present

## 2018-02-23 DIAGNOSIS — R350 Frequency of micturition: Secondary | ICD-10-CM | POA: Insufficient documentation

## 2018-02-23 DIAGNOSIS — R519 Headache, unspecified: Secondary | ICD-10-CM

## 2018-02-23 DIAGNOSIS — R739 Hyperglycemia, unspecified: Secondary | ICD-10-CM

## 2018-02-23 DIAGNOSIS — R51 Headache: Secondary | ICD-10-CM | POA: Diagnosis not present

## 2018-02-23 DIAGNOSIS — Z955 Presence of coronary angioplasty implant and graft: Secondary | ICD-10-CM | POA: Diagnosis not present

## 2018-02-23 LAB — CBC
HCT: 43.3 % (ref 39.0–52.0)
Hemoglobin: 13.7 g/dL (ref 13.0–17.0)
MCH: 28.2 pg (ref 26.0–34.0)
MCHC: 31.6 g/dL (ref 30.0–36.0)
MCV: 89.3 fL (ref 80.0–100.0)
Platelets: 256 10*3/uL (ref 150–400)
RBC: 4.85 MIL/uL (ref 4.22–5.81)
RDW: 11.9 % (ref 11.5–15.5)
WBC: 8 10*3/uL (ref 4.0–10.5)
nRBC: 0 % (ref 0.0–0.2)

## 2018-02-23 LAB — URINALYSIS, ROUTINE W REFLEX MICROSCOPIC
BACTERIA UA: NONE SEEN
BILIRUBIN URINE: NEGATIVE
KETONES UR: NEGATIVE mg/dL
Leukocytes, UA: NEGATIVE
NITRITE: NEGATIVE
PH: 5 (ref 5.0–8.0)
Protein, ur: 100 mg/dL — AB
Specific Gravity, Urine: 1.026 (ref 1.005–1.030)

## 2018-02-23 LAB — BASIC METABOLIC PANEL
Anion gap: 7 (ref 5–15)
BUN: 17 mg/dL (ref 8–23)
CO2: 26 mmol/L (ref 22–32)
CREATININE: 0.96 mg/dL (ref 0.61–1.24)
Calcium: 9.6 mg/dL (ref 8.9–10.3)
Chloride: 103 mmol/L (ref 98–111)
Glucose, Bld: 363 mg/dL — ABNORMAL HIGH (ref 70–99)
Potassium: 4 mmol/L (ref 3.5–5.1)
SODIUM: 136 mmol/L (ref 135–145)

## 2018-02-23 LAB — CBG MONITORING, ED: Glucose-Capillary: 350 mg/dL — ABNORMAL HIGH (ref 70–99)

## 2018-02-23 MED ORDER — NAPROXEN 250 MG PO TABS
375.0000 mg | ORAL_TABLET | Freq: Once | ORAL | Status: AC
  Administered 2018-02-23: 375 mg via ORAL
  Filled 2018-02-23: qty 2

## 2018-02-23 MED ORDER — MELOXICAM 7.5 MG PO TABS
15.0000 mg | ORAL_TABLET | Freq: Once | ORAL | Status: DC
  Filled 2018-02-23: qty 2

## 2018-02-23 MED ORDER — MELOXICAM 15 MG PO TABS: 15.0000 mg | ORAL_TABLET | Freq: Every day | ORAL | 0 refills | Status: DC

## 2018-02-23 MED ORDER — VALACYCLOVIR HCL 1 G PO TABS: 1000.0000 mg | ORAL_TABLET | Freq: Three times a day (TID) | ORAL | 0 refills | Status: AC

## 2018-02-23 NOTE — ED Triage Notes (Signed)
Pt reports he felt "funny" so he checked his blood pressure to find it "was over 214" and when he checked his CBG it was "real high"  Pt A/O, ambulatory in triage. When asked if he has been med compliant he stated "off and on."

## 2018-02-23 NOTE — Discharge Instructions (Addendum)
Follow up with your doctor about your leg pain and throat swelling. Your lab work and blood pressure do not appear to be emergently elevated today.  It is very important that you take your medications as directed daily to control your blood pressure and your blood sugar.  If you are having side effects with your medication you need to call your primary care doctor and schedule an appointment to discuss medication changes. It is possible that the burning pain in your scalp is a warning sign that you are about to develop shingles.  If the burning pain continues or you develop a rash along her scalp face or near your eye it is important that you immediately begin taking the medication that I have prescribed called Valtrex.  You need to take this medication as prescribed for the entire course in order to prevent a long-term painful condition called postherpetic neuralgia.

## 2018-02-23 NOTE — ED Provider Notes (Signed)
[facility] EMERGENCY DEPARTMENT Provider Note   CSN: [id number] Arrival date & time: 02/23/18  0557     History   Chief Complaint Chief Complaint  Patient presents with  . Hypertension  . Hyperglycemia    HPI [name] is a 67 y.o. male who presents to the ER with a cc of "feeling funny."  The patient states that this morning he awoke burning pain on the R side of his scalp. He denies other neurologic complaints including HA, visual complaints, weakness, paresthesia, aphasia, ataxia. He states that he checked his blood pressure and it was over 200. He also took his blood sugar and it registered "High."  He has not been compliant with his medications. He has noticed frequent urination but denies other urinary sxs. He denies cp, sob, n/v/d/c, constitutional sxs.   HPI  Past Medical History:  Diagnosis Date  . Diabetes mellitus without complication ([place])   . Hypercholesterolemia   . Hypertension   . MI (myocardial infarction) [facility])     Patient Active Problem List   Diagnosis Date Noted  . Type 2 diabetes mellitus without complication, without long-term current use of insulin ([place]) 06/09/2017  . Pure hypercholesterolemia 06/09/2017  . H/O heart artery stent 06/09/2017  . Essential hypertension, benign 06/09/2017    Past Surgical History:  Procedure Laterality Date  . CORONARY ANGIOPLASTY WITH STENT PLACEMENT     2004/2005 per patient        Home Medications    Prior to Admission medications   Medication Sig Start Date End Date Taking? Authorizing Provider  glipiZIDE (GLUCOTROL XL) 5 MG 24 hr tablet Take 1 tablet (5 mg total) by mouth daily. 06/13/17  Yes Enid Derry, [place], DO  metFORMIN (GLUCOPHAGE) 1000 MG tablet Take 1 tablet (1,000 mg total) by mouth 2 (two) times daily. 08/01/17  Yes Enid Derry, [place], DO  quinapril (ACCUPRIL) 40 MG tablet Take 1 tablet (40 mg total) by mouth 2 (two) times daily. 06/13/17  Yes Enid Derry, [place], DO  rosuvastatin  (CRESTOR) 20 MG tablet Take 1 tablet (20 mg total) by mouth daily. 06/13/17  Yes Enid Derry, [place], DO  amLODipine (NORVASC) 5 MG tablet Take 1 tablet (5 mg total) by mouth daily. Patient not taking: Reported on 02/23/2018 06/20/17   Shirley, [place], DO  empagliflozin (JARDIANCE) 10 MG TABS tablet Take 10 mg by mouth daily. Patient not taking: Reported on 02/23/2018 07/20/17   Shirley, [place], DO    Family History Family History  Problem Relation Age of Onset  . Heart disease Mother   . Diabetes Mother   . Hypertension Mother   . Alcoholism Father   . Heart disease Father   . Hypertension Sister   . Diabetes Brother   . Hypercholesterolemia Brother   . Osteoporosis Brother   . Hypertension Brother   . Kidney disease Brother     Social History Social History   Tobacco Use  . Smoking status: Former Smoker    Last attempt to quit: 04/25/1993    Years since quitting: 24.8  . Smokeless tobacco: Never Used  Substance Use Topics  . Alcohol use: No    Frequency: Never  . Drug use: No     Allergies   Patient has no known allergies.   Review of Systems Review of Systems  Ten systems reviewed and are negative for acute change, except as noted in the HPI.   Physical Exam Updated Vital Signs BP (!) 177/96 (BP Location: Right Arm)   Pulse 78  Temp 98.2 F (36.8 C) (Oral)   Resp 18   Ht 5\' 4"  (1.626 m)   Wt 86.2 kg   SpO2 98%   BMI 32.61 kg/m   Physical Exam  Constitutional: He is oriented to person, place, and time. He appears well-developed and well-nourished. No distress.  HENT:  Head: Normocephalic and atraumatic.  Eyes: Conjunctivae are normal. No scleral icterus.  Neck: Normal range of motion. Neck supple.  Cardiovascular: Normal rate, regular rhythm and normal heart sounds.  Pulmonary/Chest: Effort normal and breath sounds normal. No respiratory distress.  Abdominal: Soft. There is no tenderness.  Musculoskeletal: He exhibits no edema.  Neurological: He is alert  and oriented to person, place, and time.  Skin: Skin is warm and dry. He is not diaphoretic.  Psychiatric: His behavior is normal.  Nursing note and vitals reviewed.    ED Treatments / Results  Labs (all labs ordered are listed, but only abnormal results are displayed) Labs Reviewed  BASIC METABOLIC PANEL - Abnormal; Notable for the following components:      Result Value   Glucose, Bld 363 (*)    All other components within normal limits  CBG MONITORING, ED - Abnormal; Notable for the following components:   Glucose-Capillary 350 (*)    All other components within normal limits  CBC  URINALYSIS, ROUTINE W REFLEX MICROSCOPIC    EKG None  Radiology No results found.  Procedures Procedures (including critical care time)  Medications Ordered in ED Medications - No data to display   Initial Impression / Assessment and Plan / ED Course  I have reviewed the triage vital signs and the nursing notes.  Pertinent labs & imaging results that were available during my care of the patient were reviewed by me and considered in my medical decision making (see chart for details).  Clinical Course as of Feb 24 1523  Fri Feb 23, 2018  0740 Glucose-Capillary(!): 350 [AH]    Clinical Course User Index [AH] Margarita Mail, PA-C    Patient with hyperglycemia without ketosis or anion gap, and elevated blood pressure reading.  He is noncompliant with his medications.  She does have a burning sensation over the scalp and I have concern for potential early eruption of shingles as it is in a dermatomal pattern.  I do not see any vesicles along the scalp.  Patient also has no other neurologic complaints so I do not believe this represents stroke.  Patient is advised to take his medications as directed and follow-up with his primary care physician as soon as possible.  He appears otherwise appropriate for discharge at this time  Final Clinical Impressions(s) / ED Diagnoses   Final diagnoses:    Hyperglycemia  Essential hypertension  Scalp pain    ED Discharge Orders    None       Margarita Mail, PA-C 02/23/18 1526    Quintella Reichert, MD 02/25/18 [id number]

## 2018-03-02 ENCOUNTER — Encounter: Payer: Self-pay | Admitting: Family Medicine

## 2018-03-02 ENCOUNTER — Ambulatory Visit (INDEPENDENT_AMBULATORY_CARE_PROVIDER_SITE_OTHER): Payer: Medicare Other | Admitting: Licensed Clinical Social Worker

## 2018-03-02 ENCOUNTER — Other Ambulatory Visit: Payer: Self-pay

## 2018-03-02 ENCOUNTER — Ambulatory Visit (INDEPENDENT_AMBULATORY_CARE_PROVIDER_SITE_OTHER): Payer: Medicare Other | Admitting: Family Medicine

## 2018-03-02 VITALS — BP 178/98 | HR 91 | Temp 98.1°F | Wt 195.0 lb

## 2018-03-02 DIAGNOSIS — I10 Essential (primary) hypertension: Secondary | ICD-10-CM

## 2018-03-02 DIAGNOSIS — Z23 Encounter for immunization: Secondary | ICD-10-CM

## 2018-03-02 DIAGNOSIS — R0989 Other specified symptoms and signs involving the circulatory and respiratory systems: Secondary | ICD-10-CM

## 2018-03-02 DIAGNOSIS — F439 Reaction to severe stress, unspecified: Secondary | ICD-10-CM

## 2018-03-02 DIAGNOSIS — E119 Type 2 diabetes mellitus without complications: Secondary | ICD-10-CM

## 2018-03-02 LAB — POCT GLYCOSYLATED HEMOGLOBIN (HGB A1C): HbA1c, POC (controlled diabetic range): 14.5 % — AB (ref 0.0–7.0)

## 2018-03-02 MED ORDER — PANTOPRAZOLE SODIUM 40 MG PO TBEC: 40.0000 mg | DELAYED_RELEASE_TABLET | Freq: Every day | ORAL | 3 refills | Status: DC

## 2018-03-02 MED ORDER — AMLODIPINE BESYLATE 5 MG PO TABS: 5.0000 mg | ORAL_TABLET | Freq: Every day | ORAL | 3 refills | Status: DC

## 2018-03-02 MED ORDER — METFORMIN HCL 1000 MG PO TABS: 1000.0000 mg | ORAL_TABLET | Freq: Two times a day (BID) | ORAL | 0 refills | Status: DC

## 2018-03-02 MED ORDER — CANAGLIFLOZIN 300 MG PO TABS: 300.0000 mg | ORAL_TABLET | Freq: Every day | ORAL | 3 refills | Status: DC

## 2018-03-02 NOTE — BH Specialist Note (Signed)
Integrated Behavioral Health Initial Visit  MRN: [id number] Name: [name]  Session Start time: 3:40  Session End time: 4:00 Total time: 20 minutes  [place] joint visit with [facility] intern. See [facility] intern notes for details of this visit.   I agree with her intervention and plan.    Casimer Lanius, [place]   [phone] 4:20 PM

## 2018-03-02 NOTE — BH Specialist Note (Signed)
Integrated Behavioral Health Initial Visit  MRN: [id number] Name: [name]  Session Start time: 3:40  Session End time: 4:00 Total time: 20 minutes  Type of Service: Crump [place] Completed.      Patient and/or legal guardian verbally consented to meet with [place] about presenting concerns.  SUBJECTIVE: [name] is a 67 y.o. male referred by Dr. Enid Derry for managing stressors.   Report of symptoms  ASSESSMENT: Mood: Depressed and Affect: Tearful at times.  Patient is currently experiencing symptoms of stress which are exacerbated by communication barriers with wife. Patient feels he is disconnected from his wife and that they are unable to spend time together due to her obesity and inability to travel. Patient reported that he did want assistance with this but feels that he would only do a return visit wth [name] if his wife was involved.Patient reported not wanting to do breathing techniques that he did not feel anxious and only felt "hurt". Patient was given resources for couples counseling and will check in with [name] in two weeks to assess his progress.   Patient may benefit from and is in agreement to receive further assessment and brief therapeutic interventions to assist with managing relational stressors within marriage.  GOALS: Patient will: 1. Reduce symptoms of: stress 2. Increase knowledge and/or ability of: coping skills  3. Demonstrate ability to communicate with wife  PLAN: 1. Follow up with behavioral health clinician in two weeks 2. Follow up on resources for couples counseling.  _______________________________________________________   LIFE CONTEXT: Life Changes: patient is retired and no longer working. Patient reports wanting to work part time again.  INTERVENTION:  Solution-Focused Strategies, Supportive Counseling and Link to [employer],  Referral to Counselor/Psychotherapist     Audry Riles, [place],  [place] [phone]

## 2018-03-02 NOTE — Patient Instructions (Signed)
Thank you for coming to see me today. It was a pleasure! Today we talked about:   Please start taking your Norvasc for your blood pressure. Please continue your metformin and glipizide for your diabetes. Please also start taking invokana for your diabetes daily. Please follow up with me in 1-2 weeks.  Please schedule and appointment with our pharmacist Dr. Valentina Lucks.   Please follow-up with me  in 1-2 weeks or sooner as needed.  If you have any questions or concerns, please do not hesitate to call the office at [phone].  Take Care,   Nicholas Demmi Sindt, DO

## 2018-03-02 NOTE — Progress Notes (Signed)
Subjective:    Patient ID: [name], male    DOB: [birth year], 67 y.o.   MRN: [id number]   CC: f/u ED for DM, HTN  HPI:  Diabetes:  Last A1c 14.3 on 06/07/2017 Taking medications: metformin 1000 mg BID, but stopped jardiance bc he believed it made him lightheaded, and is on glipizide 5mg  On statin Last eye exam: up to date Last foot exam: unable to perform today ROS: denies dizziness, diaphoresis, LOC, polyuria, polydipsia  Hypertension: - Medications: norvasc - Compliance: patient reports not taking it  - Checking BP at home: no, in ED it was elevated - Denies any SOB, CP, vision changes, LE edema, medication SEs, or symptoms of hypotension - Diet: eats varied diet - Exercise: likes to walk 2 hours per day  Lump in throat: Patient also reports some trouble swallowing at times.   During visit patient was getting AVS to return and became tearful and starting crying that he is unable to read past a first grade level which is why he does not know his medications and what he should be taking (we looked up pill colors and shapes online). Patient then explained that he and his wife used to go on trips and do things together but now she just sits on her phone and is unable to perform any activities with him due to her pain from her weight. He says he loves her and misses what they once had. He also says he misses being able to work and does not like being retired, he would like to work part time somewhere but cannot read the applications to fill them out. Offered patient [facility] consult and they spoke with patient. See their note.   Globus sensation: Patient reporting feeling like there is something stuck in his throat.  Patient reports that it is there most days of the week but not there every day of the week.  Patient reports that it feels like there might be some swelling on the right side of his throat.  Patient reports that sometimes pills get stuck in his throat and feels like this if there  all day even though he knows are not there.  Smoking status reviewed  ROS: 10 point ROS is otherwise negative, except as mentioned in HPI  Patient Active Problem List   Diagnosis Date Noted  . Globus sensation 03/12/2018  . Type 2 diabetes mellitus without complication, without long-term current use of insulin ([place]) 06/09/2017  . Pure hypercholesterolemia 06/09/2017  . H/O heart artery stent 06/09/2017  . Essential hypertension, benign 06/09/2017     Objective:  BP (!) 178/98   Pulse 91   Temp 98.1 F (36.7 C) (Oral)   Wt 195 lb (88.5 kg)   SpO2 98%   BMI 33.47 kg/m  Vitals and nursing note reviewed  General: NAD, pleasant Neck: No mass or cervical lymphadenopathy noted, no thyromegaly Cardiac: RRR, normal heart sounds, no murmurs Respiratory: CTAB, normal effort Extremities: no edema or cyanosis. WWP. Skin: warm and dry, no rashes noted Neuro: alert and oriented, no focal deficits Psych: normal affect  Assessment & Plan:    Type 2 diabetes mellitus without complication, without long-term current use of insulin (HCC) Uncontrolled.  Refill patient's metformin.  Patient to also start [place] in hopes that this will have better results than Jardiance with patient.  Do not know if patient is a great candidate for insulin at this time and he is very adamant that he does not want to start insulin.  Will schedule patient with follow-up with Dr. Everitt Amber for poorly controlled diabetes.  Essential hypertension, benign Received notification from pharmacy the patient has not picked up amlodipine.  Patient reports that initially he did not pick it up but has since started taking it.  Patient does report that he did not take his amlodipine this morning.  Blood pressure in office 178/98.  Patient encouraged to continue taking his medication and was also given refill.  Globus sensation Patient complaining that he feels that there is something stuck in his throat.  Could be related to  GERD, or poorly controlled diabetes with aspect of gastroparesis.  Will trial patient on PPI.  Patient to be seen back in office for follow-up soon.  Patient had extensive meeting with integrated care and given resources for couples counseling.  [place] Alden Feagan, DO Family Medicine Resident PGY-2

## 2018-03-12 ENCOUNTER — Other Ambulatory Visit: Payer: Self-pay

## 2018-03-12 ENCOUNTER — Ambulatory Visit (INDEPENDENT_AMBULATORY_CARE_PROVIDER_SITE_OTHER): Payer: Medicare Other | Admitting: Family Medicine

## 2018-03-12 ENCOUNTER — Encounter: Payer: Self-pay | Admitting: Family Medicine

## 2018-03-12 VITALS — BP 168/88 | HR 85 | Temp 98.4°F | Ht 64.0 in | Wt 199.6 lb

## 2018-03-12 DIAGNOSIS — E119 Type 2 diabetes mellitus without complications: Secondary | ICD-10-CM | POA: Diagnosis not present

## 2018-03-12 DIAGNOSIS — I10 Essential (primary) hypertension: Secondary | ICD-10-CM

## 2018-03-12 DIAGNOSIS — R0989 Other specified symptoms and signs involving the circulatory and respiratory systems: Secondary | ICD-10-CM | POA: Diagnosis not present

## 2018-03-12 MED ORDER — EMPAGLIFLOZIN 10 MG PO TABS: 10.0000 mg | ORAL_TABLET | Freq: Every day | ORAL | 3 refills | Status: DC

## 2018-03-12 MED ORDER — HYDROCHLOROTHIAZIDE 12.5 MG PO TABS: 12.5000 mg | ORAL_TABLET | Freq: Every day | ORAL | 3 refills | Status: DC

## 2018-03-12 MED ORDER — AMLODIPINE BESYLATE 10 MG PO TABS: 10.0000 mg | ORAL_TABLET | Freq: Every day | ORAL | 3 refills | Status: DC

## 2018-03-12 NOTE — Assessment & Plan Note (Signed)
Patient complaining that he feels that there is something stuck in his throat.  Could be related to GERD, or poorly controlled diabetes with aspect of gastroparesis.  Will trial patient on PPI.  Patient to be seen back in office for follow-up soon.

## 2018-03-12 NOTE — Assessment & Plan Note (Addendum)
BP 168/88 this a.m. in office.  Will continue quinapril, and increase Norvasc to 10 mg daily.  Had patient use teach back method and also wrote on medication list in order for his wife to be able to see the changes made.

## 2018-03-12 NOTE — Assessment & Plan Note (Signed)
Uncontrolled.  Refill patient's metformin.  Patient to also start [place] in hopes that this will have better results than Jardiance with patient.  Do not know if patient is a great candidate for insulin at this time and he is very adamant that he does not want to start insulin.  Will schedule patient with follow-up with Dr. Everitt Amber for poorly controlled diabetes.

## 2018-03-12 NOTE — Assessment & Plan Note (Addendum)
Patient reports improved CBGs at home with last of 229.  Patient reports that he has now been taking his metformin and glipizide.  Wife had written down some questions regarding his medications and I have answered these on his new medication list which includes that it is safe to take glipizide and metformin at the same time. Invokana was too expensive for patient however Vania Rea previously was affordable.  Discussed with patient that this might have not been the cause of his headaches and he is willing to try Jardiance again in order to try to avoid starting insulin. Patient also reports that he is going to be exercising more and doing better with his diet. Patient also encouraged to keep follow-up appointment with Dr. Valentina Lucks on 12/12.

## 2018-03-12 NOTE — Assessment & Plan Note (Signed)
Received notification from pharmacy the patient has not picked up amlodipine.  Patient reports that initially he did not pick it up but has since started taking it.  Patient does report that he did not take his amlodipine this morning.  Blood pressure in office 178/98.  Patient encouraged to continue taking his medication and was also given refill.

## 2018-03-12 NOTE — Patient Instructions (Addendum)
Thank you for coming to see me today. It was a pleasure! Today we talked about:   Please start taking your Jardiance for your diabetes.  Please increase amlodipine to 10 mg daily. 2, 5 mg tabs.   Please bring pictures from colonoscopy.   Please follow-up with me in 2 months or sooner as needed.  If you have any questions or concerns, please do not hesitate to call the office at [phone].  Take Care,   [place] Sameria Morss, DO

## 2018-03-12 NOTE — Progress Notes (Signed)
Subjective:    Patient ID: Chaitanya Amedee, male    DOB: 1950-12-24, 67 y.o.   MRN: [id number]   CC: f/u dm and htn  HPI: Diabetes:  Last A1c 14.5 on 03/02/18 Taking medications: metformin 1000mg  BID, glipizide 5 mg, unable to afford Invokana but is willing to try Jardiance again Previously on Aspirin but that was was stopped by previous provider, and on statin Last eye exam:  Referral placed last visit Last foot exam:  Will perform foot exam today ROS: denies dizziness, diaphoresis, LOC, polyuria, polydipsia Patient reports that CBGs at home have been better and his last one this morning was 229.  Hypertension: - Medications: Quinapril 40 mg twice daily, Norvasc 5 mg - Compliance: Patient reports that he has been compliant since his last visit - Checking BP at home: Patient has been checking his blood pressure at home and his last BP was 164/88 this morning - Denies any SOB, CP, vision changes, LE edema, medication SEs, or symptoms of hypotension - Diet: Patient reports that he recently went grocery shopping with his wife where they bought healthier options together - Exercise: Patient reports that he continues to walk daily and has now been able to go outside with his wife to the park every day  Social: Patient reports that he is doing much better at this visit after having spoken with his wife.  They have started doing activities outside together.  He also reports that his wife has been spending less time on the phone after he discussed with her that this was bothersome to him.  Patient and wife have also just found a church and he is excited that he may be able to find volunteer work through them.  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  He is not interested in resources at this time because he would like to discuss it further with his wife.   Smoking status reviewed  ROS: 10 point ROS is otherwise negative, except as mentioned in  HPI  Patient Active Problem List   Diagnosis Date Noted  . Globus sensation 03/12/2018  . Type 2 diabetes mellitus without complication, without long-term current use of insulin ([place]) 06/09/2017  . Pure hypercholesterolemia 06/09/2017  . H/O heart artery stent 06/09/2017  . Essential hypertension, benign 06/09/2017     Objective:  BP (!) 168/88   Pulse 85   Temp 98.4 F (36.9 C) (Oral)   Ht 5\' 4"  (1.626 m)   Wt 199 lb 9.6 oz (90.5 kg)   SpO2 97%   BMI 34.26 kg/m  Vitals and nursing note reviewed  General: NAD, pleasant Cardiac: RRR, normal heart sounds, no murmurs Respiratory: CTAB, normal effort Extremities: no edema or cyanosis. WWP. Skin: warm and dry, no rashes noted Neuro: alert and oriented, no focal deficits Psych: normal affect  Assessment & Plan:    Essential hypertension, benign BP 168/88 this a.m. in office.  Will continue quinapril, and increase Norvasc to 10 mg daily.  Had patient use teach back method and also wrote on medication list in order for his wife to be able to see the changes made.  Type 2 diabetes mellitus without complication, without long-term current use of insulin ([place]) Patient reports improved CBGs at home with last of 229.  Patient reports that he has now been taking his metformin and glipizide.  Wife had written down some questions regarding his medications and I have answered these on his new medication list  which includes that it is safe to take glipizide and metformin at the same time. Invokana was too expensive for patient however Vania Rea previously was affordable.  Discussed with patient that this might have not been the cause of his headaches and he is willing to try Jardiance again in order to try to avoid starting insulin. Patient also reports that he is going to be exercising more and doing better with his diet. Patient also encouraged to keep follow-up appointment with Dr. Valentina Lucks on 12/12.   [place] Lyncoln Maskell, DO Family Medicine  Resident PGY-2

## 2018-04-05 ENCOUNTER — Ambulatory Visit: Payer: Medicare Other | Admitting: Pharmacist

## 2018-04-11 ENCOUNTER — Other Ambulatory Visit: Payer: Self-pay | Admitting: Gastroenterology

## 2018-04-11 DIAGNOSIS — R0989 Other specified symptoms and signs involving the circulatory and respiratory systems: Secondary | ICD-10-CM

## 2018-04-19 ENCOUNTER — Ambulatory Visit
Admission: RE | Admit: 2018-04-19 | Discharge: 2018-04-19 | Disposition: A | Discharge status: Home / Self Care | Payer: Medicare Other | Source: Ambulatory Visit | Attending: Gastroenterology | Admitting: Gastroenterology

## 2018-04-19 DIAGNOSIS — R0989 Other specified symptoms and signs involving the circulatory and respiratory systems: Secondary | ICD-10-CM

## 2018-05-14 ENCOUNTER — Encounter: Payer: Self-pay | Admitting: Family Medicine

## 2018-05-14 ENCOUNTER — Ambulatory Visit (INDEPENDENT_AMBULATORY_CARE_PROVIDER_SITE_OTHER): Payer: Medicare HMO | Admitting: Family Medicine

## 2018-05-14 ENCOUNTER — Other Ambulatory Visit: Payer: Self-pay

## 2018-05-14 VITALS — BP 168/92 | HR 74 | Temp 97.9°F | Ht 65.0 in | Wt 195.0 lb

## 2018-05-14 DIAGNOSIS — Z955 Presence of coronary angioplasty implant and graft: Secondary | ICD-10-CM

## 2018-05-14 DIAGNOSIS — R09A2 Foreign body sensation, throat: Secondary | ICD-10-CM

## 2018-05-14 DIAGNOSIS — I10 Essential (primary) hypertension: Secondary | ICD-10-CM

## 2018-05-14 DIAGNOSIS — E119 Type 2 diabetes mellitus without complications: Secondary | ICD-10-CM | POA: Diagnosis not present

## 2018-05-14 DIAGNOSIS — Z23 Encounter for immunization: Secondary | ICD-10-CM

## 2018-05-14 DIAGNOSIS — R0989 Other specified symptoms and signs involving the circulatory and respiratory systems: Secondary | ICD-10-CM

## 2018-05-14 LAB — POCT GLYCOSYLATED HEMOGLOBIN (HGB A1C): HbA1c, POC (controlled diabetic range): 14.1 % — AB (ref 0.0–7.0)

## 2018-05-14 MED ORDER — GLIPIZIDE 10 MG PO TABS: 10.0000 mg | ORAL_TABLET | Freq: Every day | ORAL | 3 refills | Status: DC

## 2018-05-14 MED ORDER — LOSARTAN POTASSIUM 50 MG PO TABS: 50.0000 mg | ORAL_TABLET | Freq: Every day | ORAL | 3 refills | Status: DC

## 2018-05-14 MED ORDER — TETANUS-DIPHTH-ACELL PERTUSSIS 5-2.5-18.5 LF-MCG/0.5 IM SUSP: 0.5000 mL | Freq: Once | INTRAMUSCULAR | 0 refills | Status: AC

## 2018-05-14 MED ORDER — ROSUVASTATIN CALCIUM 20 MG PO TABS: 20.0000 mg | ORAL_TABLET | Freq: Every day | ORAL | 3 refills | Status: DC

## 2018-05-14 NOTE — Patient Instructions (Addendum)
Thank you for coming to see me today. It was a pleasure! Today we talked about:   Please follow up with Dr. Valentina Lucks about your high blood pressure and diabetes. Please bring ALL your medication that you take everyday.   I put in referral for the doctor to help with your throat.  Please start taking the losartan 50mg  daily, and if you were still still taking quinapril, please stop taking that.  I sent a refill of glipizide and increased the dose 10mg  a day.   Please follow-up with me in 1 month or sooner as needed.  If you have any questions or concerns, please do not hesitate to call the office at [phone].  Take Care,   Nicholas Eryck Negron, DO

## 2018-05-14 NOTE — Progress Notes (Signed)
Subjective:    Patient ID: [name], male    DOB: [birth year], 68 y.o.   MRN: [id number]   CC:  HPI:  Diabetes:  Last A1c 14.5 on 11/8/9 Taking medications: metformin 1000 twice daily, Jardiance 10 mg daily, glipizide 5 mg daily-unclear what type occasions patient is actually taking given that he does not bring them into the office On Aspirin, and on statin, patient is adherent with his Crestor as he states that he can feel a difference when he takes his cholesterol medication and it makes him feel better Last eye exam:  Referred to ophthalmology Last foot exam: up to date patient reports checking his feet at home ROS: denies dizziness, diaphoresis, LOC, polyuria, polydipsia  Hypertension: - Medications: Amlodipine 10 mg, patient states that he does not believe that he has been taking his quinapril - Compliance: Patient reports compliance with amlodipine however states that he is not taking his quinapril - Checking BP at home: no - Denies any SOB, CP, vision changes, LE edema, medication SEs, or symptoms of hypotension - Diet: Tries to eat a low-sodium diet - Exercise: He is active daily  Globus sensation Patient reports that he is still having some trouble swallowing but that he has follow-up with his gastroenterologist soon.  History of stent placement Patient has not followed up with cardiology yet but states that he will make an appointment in order to follow-up.  Smoking status reviewed  ROS: 10 point ROS is otherwise negative, except as mentioned in HPI  Patient Active Problem List   Diagnosis Date Noted  . Low-level of literacy 05/21/2018  . Globus sensation 03/12/2018  . Type 2 diabetes mellitus without complication, without long-term current use of insulin ([place]) 06/09/2017  . Pure hypercholesterolemia 06/09/2017  . H/O heart artery stent 06/09/2017  . Essential hypertension, benign 06/09/2017     Objective:  BP (!) 168/92   Pulse 74   Temp 97.9 F (36.6 C)  (Oral)   Ht 5\' 5"  (1.651 m)   Wt 195 lb (88.5 kg)   BMI 32.45 kg/m  Vitals and nursing note reviewed  General: NAD, pleasant Cardiac: RRR, normal heart sounds, no murmurs Respiratory: CTAB, normal effort Abdomen: soft, nontender, nondistended Extremities: no edema or cyanosis. WWP. Skin: warm and dry, no rashes noted Neuro: alert and oriented, no focal deficits Psych: normal affect  Assessment & Plan:  Patient states that he is unable to read and has not brought his medications to this visit.  Attempted to try to figure out which medications he is taking and after a long time discussing it is unclear.  Wife does read and helps manage most of his medications so we will write out everything on AVS for her reference.  H/O heart artery stent Patient has not been seen by cardiologist, due to missed appointments.  Patient is currently on Crestor 20 mg however given history of stent should also be on aspirin.  Patient is unsure if he is taking this and does not bring medications in the office.  Has appointment with our pharmacist who can discuss this further with him as he was encouraged to bring his medications at that time.  Essential hypertension, benign Patient reports that he has not been taking his quinapril.  Is adamant that he does not believe he has been on this for a few months.  Has only been taking amlodipine 10 mg.  Patient with a blood pressure of 168/92 here in office will start on losartan 50 mg at  this time and patient to follow-up with our pharmacist Dr. Valentina Lucks.  Globus sensation Patient has follow-up with gastroenterology.   Type 2 diabetes mellitus without complication, without long-term current use of insulin (HCC) A1c today 14.1.  Poorly controlled and patient did not bring in medications to the office in order to go over these as he is illiterate.  Patient states that he overall has a pretty healthy diet and when he explains what he is eating every day it seems as if he is  eating healthfully. -Continue metformin thousand 2 times a day -Continue Jardiance 10 mg daily.   However it is unclear if patient is adherent to his Jardiance -We will increase glipizide from 5 mg to 10 mg.  Patient has previously had issues affording medications and this is why we chose a sulfonylurea in addition to Jardiance. -Patient encouraged to follow-up with pharmacy in our clinic for better help with his diabetes control as he is candidate for insulin therapy however given literacy would need extensive counseling for insulin administration.  Deviously missed appointment with pharmacist because he did not feel comfortable seeing someone else.  Encouraged him to follow-up and bring all of his medications.  Health maintenance Given PCV-23 today in office.  Encouraged to have Tdap performed, prescription sent to pharmacy.  [place] Carrina Schoenberger, DO Family Medicine Resident PGY-2

## 2018-05-15 ENCOUNTER — Other Ambulatory Visit: Payer: Self-pay

## 2018-05-15 ENCOUNTER — Ambulatory Visit: Payer: Self-pay | Admitting: Pharmacist

## 2018-05-15 NOTE — Patient Outreach (Signed)
[place] [facility]) Care Management  05/15/2018  [name] Jan 23, 1951 [id number]   Referral Date: 05/14/2018 Referral Source: MD referral Referral Reason:  Diabetes and medication management   Outreach Attempt: no answer. HIPAA compliant voice message left.   Plan: RN CM will attempt patient again within 4 business days and send letter.    Nicholas Baseman, RN, MSN [facility] Care Management Care Management Coordinator Direct Line [phone] Toll Free: [phone]  Fax: [phone]

## 2018-05-16 ENCOUNTER — Other Ambulatory Visit: Payer: Self-pay

## 2018-05-16 ENCOUNTER — Encounter: Payer: Self-pay | Admitting: Internal Medicine

## 2018-05-16 ENCOUNTER — Other Ambulatory Visit: Payer: Self-pay | Admitting: Pharmacist

## 2018-05-16 NOTE — Patient Outreach (Addendum)
[place] [facility]) Care Management  [place]  05/16/2018  [name] 09-24-1950 [id number]   Reason for call: medication management/compliance packaging  Unsuccessful telephone call attempt #1 to patient.   HIPAA compliant voicemail left requesting a return call on patient's mobile number.  Of note, patient has an upcoming appt with Nicholas Lose, PharmD regarding his diabetes on 05/24/2018.  Will wait to arrange compliance packaging (if needed) until after appt as changes to medications may occur.  Plan:  I will make another outreach attempt to patient after his appointment with PharmD at [facility].   Regina Eck, PharmD, [place]  [phone]

## 2018-05-16 NOTE — Patient Outreach (Signed)
[place] [facility]) Care Management  05/16/2018  Bentley Fissel 19-Feb-1951 [id number]   Referral Date: 05/14/2018 Referral Source: MD referral Referral Reason:  Diabetes and medication management   Outreach Attempt: spoke with patient.  He is able to verify HIPAA. Discussed referral with patient.  He states he has been having a hard time with his diabetes.  He states he really needs someone to sit down and talk with him to help him get his diabetes straight.  Patient last A1c 14.1.  Patient reports that his sugars when he checks are running around 160.   Discussed with patient affects of uncontrolled blood sugars.  He verbalized understanding and has been told of consequences.    Patient admits to HTN, DM, previous stent placement, and Hyperlipidemia.  Patient reports that he takes his medications as prescribed but wants medication in pill packs.  Discussed with patient [facility] pharmacy services.  Patient states that he was told a pharmacist would call and is agreeable to pharmacy services assisting.    Patient does not have an advanced directives and wishes not to discuss at this time.  Discussed [facility] services and support.  Patient agreeable to community nurse and pharmacy at this time.     Plan: RN CM will refer to community nurse for education and support of diabetes.   RN CM will refer to pharmacy for medication management.     Jone Baseman, RN, MSN [facility] Care Management Care Management Coordinator Direct Line [phone] Toll Free: [phone]  Fax: [phone]

## 2018-05-17 ENCOUNTER — Ambulatory Visit: Payer: Self-pay

## 2018-05-21 ENCOUNTER — Ambulatory Visit: Payer: Self-pay | Admitting: Pharmacist

## 2018-05-21 DIAGNOSIS — Z550 Illiteracy and low-level literacy: Secondary | ICD-10-CM | POA: Insufficient documentation

## 2018-05-21 NOTE — Assessment & Plan Note (Signed)
A1c today 14.1.  Poorly controlled and patient did not bring in medications to the office in order to go over these as he is illiterate.  Patient states that he overall has a pretty healthy diet and when he explains what he is eating every day it seems as if he is eating healthfully. -Continue metformin thousand 2 times a day -Continue Jardiance 10 mg daily.   However it is unclear if patient is adherent to his Jardiance -We will increase glipizide from 5 mg to 10 mg.  Patient has previously had issues affording medications and this is why we chose a sulfonylurea in addition to Jardiance. -Patient encouraged to follow-up with pharmacy in our clinic for better help with his diabetes control as he is candidate for insulin therapy however given literacy would need extensive counseling for insulin administration.  Deviously missed appointment with pharmacist because he did not feel comfortable seeing someone else.  Encouraged him to follow-up and bring all of his medications.

## 2018-05-21 NOTE — Assessment & Plan Note (Signed)
Patient has follow-up with gastroenterology.

## 2018-05-21 NOTE — Assessment & Plan Note (Signed)
Patient has not been seen by cardiologist, due to missed appointments.  Patient is currently on Crestor 20 mg however given history of stent should also be on aspirin.  Patient is unsure if he is taking this and does not bring medications in the office.  Has appointment with our pharmacist who can discuss this further with him as he was encouraged to bring his medications at that time.

## 2018-05-21 NOTE — Assessment & Plan Note (Signed)
Patient reports that he has not been taking his quinapril.  Is adamant that he does not believe he has been on this for a few months.  Has only been taking amlodipine 10 mg.  Patient with a blood pressure of 168/92 here in office will start on losartan 50 mg at this time and patient to follow-up with our pharmacist Dr. Valentina Lucks.

## 2018-05-22 ENCOUNTER — Other Ambulatory Visit: Payer: Self-pay | Admitting: *Deleted

## 2018-05-22 NOTE — Patient Outreach (Signed)
[place] [facility]) Care Management  05/22/2018  Suleman Gunning [birth year] [id number]   Referral received from telephonic are manager requesting assistance with managing diabetes (last A1C = 14.1).  Per chart, he also has history of hypertension and hypercholesterolemia.  Call placed to member, identity verified.  This care manager introduced self and stated purpose of call.  [facility] care management services explained.  He denies any urgent concerns at this time, agrees to home visit next week.  State main concern is learning to manage his diabetes.  Advised to contact this care manager with questions.  Valente David, [place], MSN [place] [phone]

## 2018-05-24 ENCOUNTER — Ambulatory Visit (INDEPENDENT_AMBULATORY_CARE_PROVIDER_SITE_OTHER): Payer: Medicare HMO | Admitting: Pharmacist

## 2018-05-24 ENCOUNTER — Encounter: Payer: Self-pay | Admitting: Pharmacist

## 2018-05-24 DIAGNOSIS — I10 Essential (primary) hypertension: Secondary | ICD-10-CM | POA: Diagnosis not present

## 2018-05-24 DIAGNOSIS — E7800 Pure hypercholesterolemia, unspecified: Secondary | ICD-10-CM | POA: Diagnosis not present

## 2018-05-24 DIAGNOSIS — E119 Type 2 diabetes mellitus without complications: Secondary | ICD-10-CM

## 2018-05-24 MED ORDER — INSULIN GLARGINE 100 UNIT/ML SOLOSTAR PEN: 10.0000 [IU] | PEN_INJECTOR | Freq: Every day | SUBCUTANEOUS | 11 refills | Status: DC

## 2018-05-24 MED ORDER — ACCU-CHEK SOFTCLIX LANCETS MISC: 12 refills | Status: DC

## 2018-05-24 MED ORDER — INSULIN GLARGINE 100 UNIT/ML SOLOSTAR PEN: 10.0000 [IU] | PEN_INJECTOR | Freq: Every day | SUBCUTANEOUS | 0 refills | Status: DC

## 2018-05-24 MED ORDER — LOSARTAN POTASSIUM 100 MG PO TABS: 100.0000 mg | ORAL_TABLET | Freq: Every day | ORAL | 3 refills | Status: DC

## 2018-05-24 MED ORDER — BLOOD GLUCOSE METER KIT: PACK | 0 refills | Status: DC

## 2018-05-24 MED ORDER — ACCU-CHEK SOFTCLIX LANCET DEV KIT: 1.0000 | PACK | Freq: Once | 0 refills | Status: AC

## 2018-05-24 MED ORDER — ACCU-CHEK AVIVA DEVI: 0 refills | Status: AC

## 2018-05-24 MED ORDER — GLUCOSE BLOOD VI STRP: 1.0000 | ORAL_STRIP | Freq: Four times a day (QID) | 12 refills | Status: DC

## 2018-05-24 NOTE — Assessment & Plan Note (Signed)
Diabetes longstanding currently uncontrolled.Patient is adherent with medication. Control is suboptimal due to diet and lifestyle habits. -Started basal insulin Lantus (insulin glargine) 10 units once daily. Patient will continue to titrate 5 unit every day if fasting CBGs > 120mg /dl until fasting CBGs reach goal or next visit.  -Increased dose of SGLT2-I Jardiance (generic name empagliflozin) to 25mg  daily -Continued metformin 1000mg  PO BID (Reported dysphagia with large pills (metformin 1000mg ) possibly 500mg  XR taken 4 pills QAM may improve ease of swallowing - consider change at next visit) -Continued glipizide 10mg  PO daily -Instructed pt to check BG daily in the morning prior to eating or administering insulin and to record BG values  -Extensively discussed pathophysiology of DM, recommended lifestyle interventions, dietary effects on glycemic control -Counseled on s/sx of and management of hypoglycemia -Educated pt to limit carbohydrate intake and increase intake of vegetables low in carbs -Encouraged pt to continue exercising regularly -Next A1C anticipated 4/20 -THN scheduled to f/u with pt via phone tomorrow (1/31) -Will call pt on Monday 2/3 to assess progress on new regimen   ASCVD risk - secondary prevention in patient with DM. Last LDL is not controlled (05/2017). ASCVD risk score is >20%  - high intensity statin indicated. Aspirin is indicated.  -Continued aspirin 81 mg  -Continued rosuvastatin 20 mg.  Reinforced need for adherence.  -Plan to recheck lipid panel at next visit in February

## 2018-05-24 NOTE — Patient Instructions (Addendum)
Thank you for coming to visit [place] today!  Start taking Lantus. Inject 10 units into the skin on your stomach once a day in the morning Check your sugar in the morning before you eat anything. Write down these numbers. Add 5 units to your dose if your sugar in the morning is higher than 120 Keep adding 5 units to your dose every day until your sugar in the morning is around 120 Start taking a higher dose of your Jardiance. We increasing this to 25 mg daily. If you still have 10 mg tablets at home, you can take 2 of these. Then pick up the new 25mg  tablets from the pharmacy Please call us if you need help with your medicines  Keep taking all of your other medicines for your diabetes including metformin, and glipizide  Start taking a higher dose of your losartan for blood pressure. You should now take 100mg  once a day. You can take two of the tablets you have now until you run out Keep taking your amlodipine  Please visit the lab before you leave  Follow-up with pharmacy clinic in 2-3 weeks We will call you next week to see how you are doing with these changes.

## 2018-05-24 NOTE — Progress Notes (Signed)
Patient ID: [name], male   DOB: August 06, 1950, 68 y.o.   MRN: [id number] Reviewed: I agree with Dr. Graylin Shiver documentation and management.

## 2018-05-24 NOTE — Progress Notes (Addendum)
S:     Chief Complaint  Patient presents with  . Medication Management    Diabetes    Patient arrives in good spirit, ambulating without assistance.  Presents for diabetes evaluation, education, and management at the request of his PCP Dr. [place] Shirley. Patient was referred on 05/14/18.  Patient was last seen by Primary Care Provider on 05/14/18.   Patient reports Diabetes was diagnosed in around 2000.   Insurance coverage/medication affordability: [employer]  Patient reports adherence with medications.  Current diabetes medications include: metformin 1000mg  BID, Jardiance 10mg  daily, glipizide 10mg  daily Current hypertension medications include: amlodipine 10mg  daily, losartan 50mg  daily  Patient denies hypoglycemic events.  Patient reported dietary habits:  Pt reports eating carbs (bread, hamburgers, potato, pasta) and candy Pt does report eating a serving of vegetables daily and enjoys eating various vegetables   Patient-reported exercise habits: reports going to the gym to walk 5 miles per day, and working on different exercise machines   Patient reports nocturia:  5x per night   O:  Physical Exam Psychiatric:        Mood and Affect: Mood normal.        Behavior: Behavior normal.    Review of Systems  All other systems reviewed and are negative.   Lab Results  Component Value Date   HGBA1C 14.1 (A) 05/14/2018   Vitals:   05/24/18 1029  BP: (!) 142/100  Pulse: 98  SpO2: 96%    Lipid Panel     Component Value Date/Time   CHOL 246 (H) 06/07/2017 1603   TRIG 124 06/07/2017 1603   HDL 77 06/07/2017 1603   CHOLHDL 3.2 06/07/2017 1603   LDLCALC 144 (H) 06/07/2017 1603   Pt states he does not check his BG consistently Home fasting CBG: remains above 100 at home    Clinical ASCVD: Yes  (History of Cardiac Stent) The 10-year ASCVD risk score Mikey Bussing DC Jr., et al., 2013) is: 43.2%   Values used to calculate the score:     Age: 27 years     Sex:  Male     Is Non-Hispanic African American: Yes     Diabetic: Yes     Tobacco smoker: No     Systolic Blood Pressure: 324 mmHg     Is BP treated: Yes     HDL Cholesterol: 77 mg/dL     Total Cholesterol: 246 mg/dL    A/P: Diabetes longstanding currently uncontrolled.Patient is adherent with medication. Control is suboptimal due to diet and lifestyle habits. -Started basal insulin Lantus (insulin glargine) 10 units once daily. Patient will continue to titrate 5 unit every day if fasting CBGs > 120mg /dl until fasting CBGs reach goal or next visit.  -Increased dose of SGLT2-I Jardiance (generic name empagliflozin) to 25mg  daily -Continued metformin 1000mg  PO BID (Reported dysphagia with large pills (metformin 1000mg ) possibly 500mg  XR taken 4 pills QAM may improve ease of swallowing - consider change at next visit) -Continued glipizide 10mg  PO daily -Instructed pt to check BG daily in the morning prior to eating or administering insulin and to record BG values  -Extensively discussed pathophysiology of DM, recommended lifestyle interventions, dietary effects on glycemic control -Counseled on s/sx of and management of hypoglycemia -Educated pt to limit carbohydrate intake and increase intake of vegetables low in carbs -Encouraged pt to continue exercising regularly -Next A1C anticipated 4/20 -THN scheduled to f/u with pt via phone tomorrow (1/31) -Will call pt on Monday 2/3 to assess  progress on new regimen   ASCVD risk - secondary prevention in patient with DM. Last LDL is not controlled (05/2017). ASCVD risk score is >20%  - high intensity statin indicated. Aspirin is indicated.  -Continued aspirin 81 mg  -Continued rosuvastatin 20 mg.  Reinforced need for adherence.  -Plan to recheck lipid panel at next visit in February, consider addition of ezetimibe at that time.  Hypertension longstanding currently uncontrolled.  BP goal = <130/80 mmHg. Patient is adherent with medication. Control is  suboptimal due to lifestyle and diet. -Increased losartan to 100mg  daily -Continued amlodipine 10mg  daily -Check BMET today  Written patient instructions provided.  Total time in face to face counseling 35 minutes.   Follow up Pharmacist Clinic Visit in 2-3 weeks.   Patient seen with Emeline General, PharmD Candidate and Juanell Fairly, PharmD, PGY-1 resident and Catie Darnelle Maffucci, PharmD,  PGY2 Pharmacy Resident.    Lab results reviewed - Scr elevated to 1.29 (increased by ~ 30%  Since last assessment)  Plan for lab follow up in the next week.  Called patient who was willing to come in and have repeat lab test in 1 week (Friday 06/01/2018).  BMET future order entered.  Patient verbalized understanding of need to come in for repeat lab work.  Consider dose reduction or D/C of losartan at that time.

## 2018-05-24 NOTE — Assessment & Plan Note (Signed)
Diabetes longstanding currently uncontrolled.Patient is adherent with medication. Control is suboptimal due to diet and lifestyle habits. -Started basal insulin Lantus (insulin glargine) 10 units once daily. Patient will continue to titrate 5 unit every day if fasting CBGs > 120mg /dl until fasting CBGs reach goal or next visit.  -Increased dose of SGLT2-I Jardiance (generic name empagliflozin) to 25mg  daily -Continued metformin 1000mg  PO BID (Reported dysphagia with large pills (metformin 1000mg ) possibly 500mg  XR taken 4 pills QAM may improve ease of swallowing - consider change at next visit) -Continued glipizide 10mg  PO daily -Instructed pt to check BG daily in the morning prior to eating or administering insulin and to record BG values  -Extensively discussed pathophysiology of DM, recommended lifestyle interventions, dietary effects on glycemic control -Counseled on s/sx of and management of hypoglycemia -Educated pt to limit carbohydrate intake and increase intake of vegetables low in carbs -Encouraged pt to continue exercising regularly -Next A1C anticipated 4/20 -THN scheduled to f/u with pt via phone tomorrow (1/31) -Will call pt on Monday 2/3 to assess progress on new regimen

## 2018-05-25 ENCOUNTER — Other Ambulatory Visit: Payer: Self-pay | Admitting: Pharmacist

## 2018-05-25 ENCOUNTER — Ambulatory Visit: Payer: Self-pay | Admitting: Pharmacist

## 2018-05-25 LAB — BASIC METABOLIC PANEL
BUN / CREAT RATIO: 20 (ref 10–24)
BUN: 26 mg/dL (ref 8–27)
CO2: 20 mmol/L (ref 20–29)
CREATININE: 1.29 mg/dL — AB (ref 0.76–1.27)
Calcium: 10 mg/dL (ref 8.6–10.2)
Chloride: 101 mmol/L (ref 96–106)
GFR calc Af Amer: 66 mL/min/{1.73_m2} (ref >59)
GFR, EST NON AFRICAN AMERICAN: 57 mL/min/{1.73_m2} — AB (ref >59)
Glucose: 296 mg/dL — ABNORMAL HIGH (ref 65–99)
POTASSIUM: 5 mmol/L (ref 3.5–5.2)
SODIUM: 139 mmol/L (ref 134–144)

## 2018-05-25 MED ORDER — EMPAGLIFLOZIN 25 MG PO TABS: 25.0000 mg | ORAL_TABLET | Freq: Every day | ORAL | 5 refills | Status: DC

## 2018-05-25 NOTE — Patient Outreach (Signed)
[place] [facility]) Care Management  [place]   05/25/2018  [name] Jan 15, 1951 [id number]  Reason for referral: Medication Management  Referral source: [facility] RN Current insurance:[employer]  PMHx includes but not limited to:  DMT2, HLD, HTN  Outreach:  Successful telephone call with [name].  HIPAA identifiers verified.  Patient is in good spirits today and is on his way to the gym to walk.  He provided me with an update of his visit with [name], PharmD.  Changes to med rec & notes were reviewed.  New RX for Jardiance was called in today (increased to 25mg  daily).   Patient reports BG this AM was 150.  He said he was going to start adding 5 units of basal insulin in the AM if FBG >150.  He states he will start this tomorrow.   Objective: Lab Results  Component Value Date   CREATININE 1.29 (H) 05/24/2018   CREATININE 0.96 02/23/2018   CREATININE 0.94 04/21/2017    Lab Results  Component Value Date   HGBA1C 14.1 (A) 05/14/2018    Lipid Panel     Component Value Date/Time   CHOL 246 (H) 06/07/2017 1603   TRIG 124 06/07/2017 1603   HDL 77 06/07/2017 1603   CHOLHDL 3.2 06/07/2017 1603   LDLCALC 144 (H) 06/07/2017 1603    BP Readings from Last 3 Encounters:  05/24/18 (!) 142/100  05/14/18 (!) 168/92  03/12/18 (!) 168/88    No Known Allergies  Medications Reviewed Today    Reviewed by [name], RPH (Pharmacist) on 05/24/18 at 1027  Med List Status: <None>  Medication Order Taking? Sig Documenting Provider Last Dose Status Informant  amLODipine (NORVASC) 10 MG tablet [id number] Yes Take 1 tablet (10 mg total) by mouth daily. Shirley, [place], DO Taking Active   empagliflozin (JARDIANCE) 10 MG TABS tablet [id number] Yes Take 10 mg by mouth daily. Shirley, [place], DO Taking Active   glipiZIDE (GLUCOTROL) 10 MG tablet [id number] Yes Take 1 tablet (10 mg total) by mouth daily before breakfast. Shirley, [place], DO Taking Active    losartan (COZAAR) 50 MG tablet [id number] Yes Take 1 tablet (50 mg total) by mouth daily. Shirley, [place], DO Taking Active   metFORMIN (GLUCOPHAGE) 1000 MG tablet [id number] Yes Take 1 tablet (1,000 mg total) by mouth 2 (two) times daily. Shirley, [place], DO Taking Active   rosuvastatin (CRESTOR) 20 MG tablet [id number] Yes Take 1 tablet (20 mg total) by mouth daily. Shirley, [place], DO Taking Active           Assessment:  Drugs sorted by system:  Cardiovascular: amlodipine, losartan, rosuvastatin  Gastrointestinal: patoptrazole  Endocrine: empagliflozin, glipizide, Lantus, metformin  Medication Review Findings:  . Patient to recheck Scr due to elevated numbers.  He remains on ARB until that time. . DM medications reviewed with patient.  He verbalizes understanding.   Plan: -I will plan to have pillbox filled x2 weeks at [facility] patient home visit next week.  Will refer patient for compliance packs at [facility] once medication doses stable. -Patient to have f/u labs done next week d/t recent bump (~30%) in Scr.  [name], PharmD, [place]  [phone]

## 2018-05-25 NOTE — Addendum Note (Signed)
Addended by: Leavy Cella on: 05/25/2018 02:58 PM   Modules accepted: Orders

## 2018-05-25 NOTE — Addendum Note (Signed)
Addended by: Leavy Cella on: 05/25/2018 02:44 PM   Modules accepted: Orders

## 2018-05-29 ENCOUNTER — Other Ambulatory Visit: Payer: Self-pay | Admitting: Pharmacist

## 2018-05-29 ENCOUNTER — Encounter: Payer: Self-pay | Admitting: *Deleted

## 2018-05-29 ENCOUNTER — Ambulatory Visit: Payer: Self-pay | Admitting: Pharmacist

## 2018-05-29 ENCOUNTER — Other Ambulatory Visit: Payer: Self-pay | Admitting: *Deleted

## 2018-05-29 NOTE — Patient Outreach (Signed)
[place] [facility]) Care Management   05/29/2018  Haston Casebolt Mar 10, 1951 [id number]  [name] is an 68 y.o. male  Subjective:   Member alert and oriented x3, denies complaints of pain or discomfort.  Last office visit with primary MD was 05/14/2018, seen by office pharmacist on 05/24/2018.  Advised to follow up within the next 3 weeks.    Objective:   Review of Systems  Constitutional: Negative.   HENT: Negative.   Eyes: Negative.   Respiratory: Negative.   Cardiovascular: Negative.   Gastrointestinal: Negative.   Genitourinary: Negative.   Musculoskeletal: Negative.   Skin: Negative.   Neurological: Negative.   Endo/Heme/Allergies: Negative.   Psychiatric/Behavioral: Negative.     Physical Exam  Constitutional: He is oriented to person, place, and time. He appears well-developed and well-nourished.  Neck: Normal range of motion.  Cardiovascular: Normal rate, regular rhythm and normal heart sounds.  Respiratory: Effort normal and breath sounds normal.  GI: Soft. Bowel sounds are normal.  Musculoskeletal: Normal range of motion.  Neurological: He is alert and oriented to person, place, and time.  Skin: Skin is warm and dry.   BP 126/72 (BP Location: Right Arm, Patient Position: Sitting, Cuff Size: Normal)   Pulse 93   Resp 18   Ht 1.626 m ('5\' 4"'$ )   Wt 190 lb (86.2 kg)   SpO2 96%   BMI 32.61 kg/m   Encounter Medications:   Outpatient Encounter Medications as of 05/29/2018  Medication Sig Note  . ACCU-CHEK SOFTCLIX LANCETS lancets Use as instructed. Up to four times per day   . amLODipine (NORVASC) 10 MG tablet Take 1 tablet (10 mg total) by mouth daily.   . blood glucose meter kit and supplies Dispense based on patient and insurance preference. Use up to four times daily as directed. (FOR ICD-10 E10.9, E11.9).   Marland Kitchen Blood Glucose Monitoring Suppl (ACCU-CHEK AVIVA) device Use as instructed. Up to four times per day   . empagliflozin (JARDIANCE) 25 MG TABS tablet  Take 25 mg by mouth daily. 05/29/2018: Patient needs to pick up   . glipiZIDE (GLUCOTROL) 10 MG tablet Take 1 tablet (10 mg total) by mouth daily before breakfast.   . glucose blood (ACCU-CHEK AVIVA) test strip 1 each by Other route 4 (four) times daily. Use as instructed. Up to four times per day   . Insulin Glargine (LANTUS SOLOSTAR) 100 UNIT/ML Solostar Pen Inject 10 Units into the skin daily. Titrate as instructed. Max dose 30 units daily   . losartan (COZAAR) 100 MG tablet Take 1 tablet (100 mg total) by mouth daily.   . metFORMIN (GLUCOPHAGE) 1000 MG tablet Take 1 tablet (1,000 mg total) by mouth 2 (two) times daily.   . rosuvastatin (CRESTOR) 20 MG tablet Take 1 tablet (20 mg total) by mouth daily. 05/29/2018: Takes in evening   . [DISCONTINUED] pantoprazole (PROTONIX) 40 MG tablet Take 40 mg by mouth daily.    No facility-administered encounter medications on file as of 05/29/2018.     Functional Status:   In your present state of health, do you have any difficulty performing the following activities: 05/29/2018  Hearing? Y  Comment left ear HOH  Vision? N  Comment Reading glasses  Difficulty concentrating or making decisions? N  Walking or climbing stairs? N  Dressing or bathing? N  Doing errands, shopping? N  Preparing Food and eating ? N  Using the Toilet? N  In the past six months, have you accidently leaked urine? N  Do you have problems with loss of bowel control? N  Managing your Medications? N  Managing your Finances? N  Housekeeping or managing your Housekeeping? N  Some recent data might be hidden    Fall/Depression Screening:    Fall Risk  05/29/2018 05/14/2018 03/12/2018  Falls in the past year? 0 0 0   PHQ 2/9 Scores 05/29/2018 05/16/2018 05/14/2018 03/12/2018 03/02/2018 06/07/2017  PHQ - 2 Score 0 0 1 2 0 0    Assessment:    Met with member at scheduled time, accompanied by [place] (see notes for details and medication review).  Consent obtained.  Wife  also present during visit as member need help with reading and understanding, and she is also the primary cook in the home.  Member starting to check blood sugar and blood pressure daily.  He is only recording the systolic for his blood pressure, advised to record both systolic and diastolic for better trending and management of medications.  State he like the meter that he has for blood sugars, but does not like the lancets.  Discussed other options, will look into buying over the counter lancets and use the provided meter and strips.  Assessment and care plan complete.  Both wife and member deny any questions.  Provided with [facility] calendar and contact information. Advised to contact with concerns.  Plan:   Will follow up with member within the next month.  THN CM Care Plan Problem One     Most Recent Value  Care Plan Problem One  Knowledge defecit related to management of diabetes as evidenced by elevated A1C  Role Documenting the Problem One  Care Management [place] for Problem One  Active  THN Long Term Goal   Member's A1C will be decreased to </= 11 within the next 3 months  THN Long Term Goal Start Date  05/29/18  Interventions for Problem One Long Term Goal  Member and wife educated on meaning of A1C and importance of decreasing to target range.  Educated on signs/symptoms of hypo/hyperglycemia and treatment for both.  THN CM Short Term Goal #1   Member will check and record blood sugar daily over the next 4 weeks  THN CM Short Term Goal #1 Start Date  05/29/18  Interventions for Short Term Goal #1  Member and wife educated on importance of daily monitoring.  Educated on alternate/more comfortable options for lancets.  Provided with logs for daily monitoring.  THN CM Short Term Goal #2   Member will report adherence to diabetic diet over the next 4 weeks  THN CM Short Term Goal #2 Start Date  05/29/18  Interventions for Short Term Goal #2  Member and wife educated on proper  foods for diabetic diet.  Educated on reading labels, brochure provided regarding meal choices, carbohydrate counting and portion control     Valente David, Therapist, sports, MSN [place] [phone]

## 2018-05-29 NOTE — Patient Outreach (Addendum)
[facility]) Care Management  [place]  05/30/2018  [name] [birth year] [id number]  Reason for referral: Medication Management Current insurance:[employer]  PMHx includes but not limited to:  HTN, T2DM, HLD  Outreach:  Successful home visit with [name] and wife.  HIPAA identifiers verified.   Diabetes His FBG was 150 this AM.  He takes his Lantus in the AM after checking FBG.  He is now up to Lantus 20 units (max of 30 units).  Patient was able to explain his new titration scale.  His FBGs have ranged 150-160s.  He states his checking more frequently.  He denies S/Sx of hypoglycemia.  He is checking BG only in the AM.  Provided patient with BG log an encouraged him to bring log to future appointments.  Recent A1c was 14.1 on 05/14/2018.  Encouraged patient to pick up Jardiance at [facility].  Jardiance was increased to '25mg'$ .  Will attempt patient assistance for Jardiance (forms filled out during visit).  Patient verbalizes understanding.  Patient appears highly motivated at this time. Provided patient with "[employer] Diabetes Handout" that reviews foods and will help him with meal preparation and snacking.  Hypertension Patient states he is taking his BP readings around 10am and he takes his BP medications around 9am.  He reports readings of 130-140s, however he does not record the diastolic numbers.  Encouraged patient to record both & reiterated BP goal  <130/80 mmHg.  Patient reports now taking losartan '100mg'$  (increased from '50mg'$ ).  He is taking (2) of the '50mg'$  tablets until they run out to decrease cost.  Medication Management  All medications were present, except new Jardiance '25mg'$ .    Patient admits to not taking aspirin daily.  He has a full bottle of ASA '91mg'$  EC alongside his medications.  Encouraged patient to take aspirin daily based on his co-morbid conditions and cardiac history (added ASA to med list).  Condensed medications to AM/PM pill box that was filled with  patient input.  Patient is not an ideal candidate for pill packaging at this time due to medication titrations and upcoming labs.  Will revisit this idea in the future as therapy becomes more stable.    Objective: Lab Results  Component Value Date   CREATININE 1.29 (H) 05/24/2018   CREATININE 0.96 02/23/2018   CREATININE 0.94 04/21/2017    Lab Results  Component Value Date   HGBA1C 14.1 (A) 05/14/2018    Lipid Panel     Component Value Date/Time   CHOL 246 (H) 06/07/2017 1603   TRIG 124 06/07/2017 1603   HDL 77 06/07/2017 1603   CHOLHDL 3.2 06/07/2017 1603   LDLCALC 144 (H) 06/07/2017 1603    BP Readings from Last 3 Encounters:  05/29/18 126/72  05/24/18 (!) 142/100  05/14/18 (!) 168/92    No Known Allergies  Medications Reviewed Today    Reviewed by Lavera Guise, [facility] (Pharmacist) on 05/28/18 at [phone]  Med List Status: <None>  Medication Order Taking? Sig Documenting Provider Last Dose Status Informant  ACCU-CHEK SOFTCLIX LANCETS lancets [id number]  Use as instructed. Up to four times per day Zenia Resides, MD  Active   amLODipine (NORVASC) 10 MG tablet [id number] No Take 1 tablet (10 mg total) by mouth daily. Shirley, [place], DO Taking Active   blood glucose meter kit and supplies [id number]  Dispense based on patient and insurance preference. Use up to four times daily as directed. (FOR ICD-10 E10.9, E11.9). Zenia Resides, MD  Active   Blood Glucose Monitoring Suppl (ACCU-CHEK AVIVA) device [id number]  Use as instructed. Up to four times per day Zenia Resides, MD  Active   empagliflozin (JARDIANCE) 25 MG TABS tablet [id number]  Take 25 mg by mouth daily. Zenia Resides, MD  Active   glipiZIDE (GLUCOTROL) 10 MG tablet [id number] No Take 1 tablet (10 mg total) by mouth daily before breakfast. Shirley, [place], DO Taking Active   glucose blood (ACCU-CHEK AVIVA) test strip [id number]  1 each by Other route 4 (four) times daily. Use as instructed. Up to four times per  day Zenia Resides, MD  Active   Insulin Glargine (LANTUS SOLOSTAR) 100 UNIT/ML Solostar Pen [id number]  Inject 10 Units into the skin daily. Titrate as instructed. Max dose 30 units daily Zenia Resides, MD  Active         Discontinued 05/28/18 [id number] (Change in therapy)   losartan (COZAAR) 100 MG tablet [id number]  Take 1 tablet (100 mg total) by mouth daily. Zenia Resides, MD  Active   metFORMIN (GLUCOPHAGE) 1000 MG tablet [id number] No Take 1 tablet (1,000 mg total) by mouth 2 (two) times daily. Shirley, [place], DO Taking Active   pantoprazole (PROTONIX) 40 MG tablet [id number]  Take 40 mg by mouth daily. [provider]  Active   rosuvastatin (CRESTOR) 20 MG tablet [id number] No Take 1 tablet (20 mg total) by mouth daily. Shirley, [place], DO Taking Active          Medication Assistance Findings:   Patient Assistance Programs: 1) Jardiance made by [employer] o Income requirement met: '[x]'$  Yes '[]'$  No '[]'$  Unknown o Out-of-pocket prescription expenditure met:    '[]'$  Yes '[]'$  No  '[]'$  Unknown  '[x]'$  Not applicable - Patient has met application requirements to apply for this patient assistance program.      PLAN: -I will follow up patient s/p labs to reinforce medication changes if needed -I will follow up as needed for patient assistance   Regina Eck, PharmD, [place]  [phone]

## 2018-05-30 ENCOUNTER — Ambulatory Visit: Payer: 59 | Admitting: Pharmacist

## 2018-05-30 ENCOUNTER — Other Ambulatory Visit: Payer: Self-pay | Admitting: Pharmacy Technician

## 2018-05-30 NOTE — Patient Outreach (Signed)
[facility]) Care Management  05/30/2018  [name] 1950/11/26 [id number]                                                  Medication Assistance Referral  Referral From: [facility] RPh Jenne Pane  Medication/Company: Vania Rea / [employer] Patient application portion: N/A Provider application portion: Faxed  to Dr. Carolyne Littles brought completed patient portion of application to [facility] office.   Follow up:  Will submit completed application to [employer] once all documents have been received.  Maud Deed Chana Bode [place] Certified Pharmacy Technician [place] Management Direct Dial:[phone]

## 2018-06-01 ENCOUNTER — Other Ambulatory Visit: Payer: 59

## 2018-06-01 DIAGNOSIS — I10 Essential (primary) hypertension: Secondary | ICD-10-CM | POA: Diagnosis not present

## 2018-06-01 NOTE — Progress Notes (Signed)
LABS

## 2018-06-02 LAB — BASIC METABOLIC PANEL
BUN / CREAT RATIO: 21 (ref 10–24)
BUN: 25 mg/dL (ref 8–27)
CHLORIDE: 105 mmol/L (ref 96–106)
CO2: 18 mmol/L — AB (ref 20–29)
CREATININE: 1.2 mg/dL (ref 0.76–1.27)
Calcium: 9.8 mg/dL (ref 8.6–10.2)
GFR calc Af Amer: 72 mL/min/{1.73_m2} (ref >59)
GFR calc non Af Amer: 62 mL/min/{1.73_m2} (ref >59)
Glucose: 59 mg/dL — ABNORMAL LOW (ref 65–99)
POTASSIUM: 4.8 mmol/L (ref 3.5–5.2)
SODIUM: 140 mmol/L (ref 134–144)

## 2018-06-04 ENCOUNTER — Telehealth: Payer: Self-pay | Admitting: Pharmacist

## 2018-06-04 ENCOUNTER — Other Ambulatory Visit: Payer: Self-pay | Admitting: Pharmacist

## 2018-06-04 ENCOUNTER — Ambulatory Visit: Payer: Self-pay | Admitting: Pharmacist

## 2018-06-04 DIAGNOSIS — E119 Type 2 diabetes mellitus without complications: Secondary | ICD-10-CM

## 2018-06-04 MED ORDER — INSULIN GLARGINE 100 UNIT/ML SOLOSTAR PEN: 22.0000 [IU] | PEN_INJECTOR | Freq: Every day | SUBCUTANEOUS | Status: DC

## 2018-06-04 NOTE — Patient Outreach (Signed)
[place] [facility]) Care Management  06/04/2018  [name] 1951/04/15 [id number]  Successful call with [name].  He states he is doing well today.  He reports compliance with medication, however he has not picked up his Jardiance from the pharmacy.  He states the pillbox is working well for him.  Patient states he is up to 25 units of Lantus.  Patient reports the following FBG: 2/8 98 2/9 75 2/10 269  His BG from BMP taken on 06/02/18 was 59.  Patient denies s/sx of hypoglycemia and also counseled on what to do for BG<70.   PLAN: -Will follow up with PGY2 Pharmacy Resident about patient progress.

## 2018-06-04 NOTE — Telephone Encounter (Signed)
Called patient to discuss lab results.   Shared that his kidney function was acceptable and his blood sugar readings are concerning.  After discussion of his home readings we changed his Lantus to 22 units daily (from 25 units).     He will stay at 22 units until next visit in the next two weeks with either Dr. Martinique or myself.  He was asked to call and make an appointment.

## 2018-06-05 ENCOUNTER — Other Ambulatory Visit: Payer: Self-pay | Admitting: Pharmacy Technician

## 2018-06-05 ENCOUNTER — Ambulatory Visit: Payer: Medicare HMO | Admitting: Internal Medicine

## 2018-06-05 NOTE — Patient Outreach (Signed)
[place] [facility] Care Management  06/05/2018  [name] [birth year] [id number]   Received provider portion(s) of patient assistance application for Jardaince. Faxed completed application and required documents into B-I.  Will follow up with company in 7-10 business days to check status of application.  Maud Deed Chana Bode [place] Certified Pharmacy Technician [place] Management Direct [phone]

## 2018-06-11 ENCOUNTER — Other Ambulatory Visit: Payer: Self-pay | Admitting: Pharmacist

## 2018-06-11 NOTE — Patient Outreach (Signed)
[place] [facility]) Care Management  06/11/2018  [name] 03-05-1951 [id number]   Incoming call from [name] with HIPAA identifiers verified.  Patient states he is almost out of pen needles.  Message sent to Dr. Valentina Lucks for refill.  Patient states he is doing "real good" today.  He states his FBG was 129!  He is now taking Jardiance.  PAP applications pending for Jardiance with [employer].    PLAN: -Will update patient when pen needles are ready and PAP status later this week  Regina Eck, PharmD, [place]  [phone]

## 2018-06-13 ENCOUNTER — Ambulatory Visit: Payer: Self-pay | Admitting: Pharmacist

## 2018-06-13 ENCOUNTER — Other Ambulatory Visit: Payer: Self-pay | Admitting: Family Medicine

## 2018-06-13 DIAGNOSIS — R221 Localized swelling, mass and lump, neck: Secondary | ICD-10-CM | POA: Diagnosis not present

## 2018-06-13 DIAGNOSIS — K219 Gastro-esophageal reflux disease without esophagitis: Secondary | ICD-10-CM | POA: Diagnosis not present

## 2018-06-13 DIAGNOSIS — R0989 Other specified symptoms and signs involving the circulatory and respiratory systems: Secondary | ICD-10-CM | POA: Diagnosis not present

## 2018-06-13 MED ORDER — PEN NEEDLES 32G X 4 MM MISC: 1.0000 | Freq: Every day | 2 refills | Status: DC

## 2018-06-14 ENCOUNTER — Other Ambulatory Visit: Payer: Self-pay | Admitting: Pharmacy Technician

## 2018-06-14 NOTE — Patient Outreach (Signed)
[facility]) Care Management  06/14/2018  [name] 21-Aug-1950 [id number]    Follow up call placed to [employer] regarding patient assistance application(s) for [name] states that patient has been temporarily approved and a 3 month supply of medication should arrive at patients home in 7-10 business days. She also states that company requests LIS denial letter in order for them to extend approval to the end of the year.  Informed [facility] RPh Lottie Dawson of requirement.  Maud Deed Chana Bode [place] Certified Pharmacy Technician [place] Management Direct Dial:[phone]

## 2018-06-15 ENCOUNTER — Other Ambulatory Visit: Payer: Self-pay | Admitting: Pharmacist

## 2018-06-15 NOTE — Patient Outreach (Signed)
[name]) Care Management  06/15/2018  [name] 26-Nov-1950 [id number]   Successful outreach call to Mr. Humphres and his wife, [name] with HIPAA identifiers verified x2.  Patient states he is doing well today with FBG 130.  He continues to work out daily and is trying to eat a better diet.  He stated that he will pick up his pen needles today.  Extra help/LIS application was filled out with patient due to [employer] requirements.  Informed patient that his 84-month supply of Jardiance will be shipped to his home in 7-10 business days. Future shipments pending LIS application.  Patient to call [facility] Pharmacist when LIS letter is received.  Patient verbalized understanding.  PLAN: I will follow up with patient next month  Regina Eck, PharmD, [place]  [phone]

## 2018-07-10 ENCOUNTER — Other Ambulatory Visit: Payer: Self-pay | Admitting: *Deleted

## 2018-07-10 NOTE — Patient Outreach (Signed)
[place]) Care Management  07/10/2018  [name] 1950/12/10 [id number]   Call placed to member to follow up on diabetes management, no answer.  HIPAA compliant voice message left, will follow up within the next 3-4 business days.  Valente David, [place], MSN [place] [phone]

## 2018-07-16 ENCOUNTER — Other Ambulatory Visit: Payer: Self-pay | Admitting: *Deleted

## 2018-07-16 NOTE — Patient Outreach (Signed)
[place] [facility]) Care Management  07/16/2018  [name] 08/23/1950 [id number]   Call placed to member to follow up on diabetes management, no answer.  Second unsuccessful attempt.  HIPAA compliant voice message left, unsuccessful outreach letter sent.  Will follow up within the next 3-4 business days.  Valente David, [place], MSN [place] [phone]

## 2018-07-20 ENCOUNTER — Other Ambulatory Visit: Payer: Self-pay | Admitting: *Deleted

## 2018-07-20 ENCOUNTER — Ambulatory Visit: Payer: Self-pay | Admitting: Pharmacist

## 2018-07-20 NOTE — Patient Outreach (Signed)
[place] [facility]) Care Management  07/20/2018  [name] 1950/10/20 [id number]   Third attempt to follow up on diabetes management, no answer.  HIPAA compliant voice message left.  If no call back by 3/31 will close case due to inability to maintain contact.  Valente David, [place], MSN [place] [phone]

## 2018-07-23 ENCOUNTER — Ambulatory Visit: Payer: Self-pay | Admitting: Pharmacist

## 2018-07-25 ENCOUNTER — Other Ambulatory Visit: Payer: Self-pay | Admitting: *Deleted

## 2018-07-25 ENCOUNTER — Other Ambulatory Visit: Payer: Self-pay | Admitting: Pharmacist

## 2018-07-25 ENCOUNTER — Ambulatory Visit: Payer: Medicare HMO | Admitting: Pharmacist

## 2018-07-25 NOTE — Patient Outreach (Signed)
[place] [facility]) Care Management  07/25/2018  [name] 01/25/1951 [id number]   Call placed to member to follow up on diabetes management as voice message was received after last unsuccessful call.  No answer, HIPAA compliant voice message left.  Will close case at this time due to inability to maintain contact.  Will notify primary MD and pharmacy team of case closure.  Valente David, [place], MSN [place] [phone]

## 2018-07-25 NOTE — Patient Outreach (Signed)
[place] [facility]) Care Management  [place]  07/25/2018  [name] [birth year] [id number]   Reason for call: medication management  Outreach:  Unsuccessful telephone call attempt to patient.   HIPAA compliant voicemail left requesting a return call  Plan:  I will make another outreach attempt to patient within 3-4 business days   Regina Eck, PharmD, [place]  [phone]

## 2018-07-27 ENCOUNTER — Other Ambulatory Visit: Payer: Self-pay | Admitting: Pharmacist

## 2018-07-27 ENCOUNTER — Ambulatory Visit: Payer: Self-pay | Admitting: Pharmacist

## 2018-07-27 NOTE — Patient Outreach (Signed)
[place] [facility]) Care Management  [place]  07/27/2018  [name] 1951-02-16 [id number]   Reason for referral: Medication Assistance  Referral source: [facility] RN Current insurance: [place]  Reason for call: f/u LIS letter for continued patient assistance with Jardiance  Outreach:  Unsuccessful telephone call attempt #2 to patient.   HIPAA compliant voicemail left requesting a return call  Plan:  I will make another outreach attempt to patient within 3-4 business days   Regina Eck, PharmD, [place]  [phone]

## 2018-07-31 ENCOUNTER — Ambulatory Visit: Payer: Self-pay | Admitting: Pharmacist

## 2018-08-01 ENCOUNTER — Other Ambulatory Visit: Payer: Self-pay | Admitting: Pharmacist

## 2018-08-01 NOTE — Patient Outreach (Signed)
[facility]) Care Management  [place]  08/01/2018  Sosaia Pittinger October 31, 1950 [id number]   Reason for referral: Medication Assistance/MANAGEMENT  Current insurance: [employer]  Reason for call: LIS denial letter  Outreach:  Unsuccessful telephone call attempt to patient.   HIPAA compliant voicemail left requesting a return call  Plan:  -I will make another outreach attempt to patient within 3-4 business days.     Regina Eck, PharmD, [place]  [phone]

## 2018-08-06 ENCOUNTER — Ambulatory Visit: Payer: Self-pay | Admitting: Pharmacist

## 2018-08-06 ENCOUNTER — Other Ambulatory Visit: Payer: Self-pay

## 2018-08-06 ENCOUNTER — Other Ambulatory Visit: Payer: Self-pay | Admitting: Pharmacist

## 2018-08-06 NOTE — Patient Outreach (Signed)
[facility]) Care Management  [place]   08/06/2018  [name] 11-27-1950 [id number]  Successful outreach to [name] with HIPAA identifiers verified.  Patient states he has still not received LIS confirmation.  This letter is still needed for [employer] to send additional Jardiance to patient.  They have mailed out a 60-month supply.  Instructed patient to call SS office.  Office stated they had no record of pending application, however when we tried to re-do LIS application today-->website states "this SSN number has application pending".  We will await letter to be mailed to patient.  Instructed patient and wife to call me once LIS/extra help letter received.    Patient's FBGs have been running in the 190s.  He states he has not been eating like he should be.  Encouraged patient to continue exercising and enjoying a DM healthy diet.  Plan: . Will f/u with patient next month   Regina Eck, PharmD, [place]  [phone]

## 2018-09-03 ENCOUNTER — Ambulatory Visit: Payer: Self-pay | Admitting: Pharmacist

## 2018-09-03 ENCOUNTER — Other Ambulatory Visit: Payer: Self-pay | Admitting: Pharmacist

## 2018-09-03 NOTE — Patient Outreach (Signed)
[facility]) [place]  09/03/2018  Derek Laughter July 13, 1950 [id number]  Reason for referral/call: medication assistance/status of LIS/extra help  Successful outreach to Mr. & Mrs. Coolman with HIPAA identifiers verified.  Patient states he has still not received LIS confirmation.  This letter is still needed for [employer] to send additional Jardiance to patient.  Instructed patient to call SS office to check again([phone]) & also create a profile on [email or web address]  Instructed patient and wife to call me once LIS/extra help letter received.    Patient's FBGs have been running in the 180-190s.  He states he is trying to eat better.  He continues to walk daily.  Encouraged patient to continue exercising and enjoying a DM healthy diet.  PLAN: -I will follow up in 2 weeks.  Regina Eck, PharmD, [place]  [phone]

## 2018-09-19 ENCOUNTER — Other Ambulatory Visit: Payer: Self-pay | Admitting: Pharmacy Technician

## 2018-09-19 NOTE — Patient Outreach (Signed)
[place] [facility]) Care Management  09/19/2018  [name] April 28, 1950 [id number]   Received copy of patient LIS denial letter. Faxed into B-I for appeal.  Will follow up with B-I in 5-7 business days to check application status.  Maud Deed Chana Bode [place] Certified Pharmacy Technician [place] Management Direct Dial:[phone]

## 2018-09-20 ENCOUNTER — Other Ambulatory Visit: Payer: Self-pay | Admitting: Pharmacist

## 2018-09-20 ENCOUNTER — Ambulatory Visit: Payer: Self-pay | Admitting: Pharmacist

## 2018-09-21 ENCOUNTER — Other Ambulatory Visit: Payer: Self-pay | Admitting: Pharmacy Technician

## 2018-09-21 NOTE — Patient Outreach (Signed)
[place] [facility]) Care Management  09/21/2018  Zedrick Springsteen 1950-11-18 [id number]                          Medication Assistance Referral  Referral From: [facility] RPh Jenne Pane  Medication/Company: [name] / [employer] Patient application portion:  Mailed Provider application portion: Faxed  to Dr. Enid Derry   Follow up:  Will follow up with patient in 7-10 business days to confirm application(s) have been received.  Maud Deed [place] Cord Certified Pharmacy Technician [place] Management Direct [phone]  Follow up call placed to [employer], Horris Latino confirms that LIS denial has been received and patients approval has been extended to 04/25/19. Horris Latino put in a refill request and stated it would ship out on 6/2 and patient should received in 7-10 business days.  Unsuccessful call placed to patient to update, HIPAA compliant voicemail left.  Will route note to [place] to update.  Maud Deed Chana Bode [place] Certified Pharmacy Technician [place] Management Direct [phone]

## 2018-09-24 ENCOUNTER — Telehealth: Payer: Self-pay | Admitting: *Deleted

## 2018-09-24 ENCOUNTER — Other Ambulatory Visit: Payer: Self-pay | Admitting: Pharmacist

## 2018-09-24 ENCOUNTER — Telehealth: Payer: Self-pay | Admitting: Family Medicine

## 2018-09-24 DIAGNOSIS — E119 Type 2 diabetes mellitus without complications: Secondary | ICD-10-CM

## 2018-09-24 MED ORDER — INSULIN GLARGINE 100 UNIT/ML SOLOSTAR PEN: 22.0000 [IU] | PEN_INJECTOR | Freq: Every day | SUBCUTANEOUS | 0 refills | Status: DC

## 2018-09-24 NOTE — Patient Outreach (Signed)
[name]) Care Management [place]  09/24/2018  Jesstin Studstill 01-Mar-1951 [id number]  Reason for referral: medication management/diabetes  Incoming call from Ms. Haynesworth regarding insulin samples.  We are currently assisting patient with application to patient assistance program through [employer] health practitioner).  Left message with [facility] requesting samples for patient.  Copay at the pharmacy is $45 and patient is on fixed income.  Will continue to follow.  Regina Eck, PharmD, [place]  [phone]

## 2018-09-24 NOTE — Telephone Encounter (Signed)
Note from wifes MyChart:  Shirley, Martinique, DO  to Ramond Marrow      1:38 PM  Good afternoon Ms. Barradas,   We do have Lantus samples available in the office. They must remain refrigerated so he can call ahead on Monday and let them know that Jakeel Starliper will be coming in to pick up some samples and we will help arrange this for him. Hope that everything is going well.   Dr. Julio Sicks, [name]  to Shirley, [place], DO       2:33 PM  Hi Dr. Enid Derry. I'm writing on behalf of my husband Bralin. The pharmacist Almyra Free, who is working with Cecilie Lowers, suggested contacting you to see if you have any samples of the Lantus Solostar Pen inj. 72ml. She is working on getting this free for him, but it will take 3 to 4 weeks. If he picks it up at [facility], its a 45 dollar co pay. If you have any samples of this to provide him, until the free ones come thru,, would be a great help. He came pick them up Monday if you have any. Thank you, [name]

## 2018-09-24 NOTE — Telephone Encounter (Signed)
When pt calls please set up an appointment with Dr. Valentina Lucks. Please tell the patient to bring all his medications and meter to the appointment. This appointment is for medication management. jw

## 2018-09-24 NOTE — Telephone Encounter (Signed)
Will forward to admin pool.  Jazmin Hartsell,CMA

## 2018-09-24 NOTE — Telephone Encounter (Signed)
Set aside 2 samples for pt.  Wife informed. Christen Bame, CMA

## 2018-09-26 NOTE — Patient Outreach (Signed)
[place] [facility]) Care Management  [place]  09/26/2018  [name] March 14, 1951 [id number]   Reason for referral: Medication Management  Referral source: [facility] RN Current insurance: [employer]  Reason for call:  Medication management  Outreach:  Unsuccessful telephone call attempt to patient.   HIPAA compliant voicemail left requesting a return call  Plan:  -I will make another outreach attempt to patient within 3-4 business days.    Regina Eck, PharmD, [place]  [phone]

## 2018-09-28 NOTE — Telephone Encounter (Signed)
-----   Message from Lavera Guise, [facility] sent at 09/21/2018 11:47 AM EDT ----- Regarding: RE: patient updates Okay! I did find out that patient does not want to come in for OV because of COVID, but is agreeable to video or phone.   Thanks! Almyra Free ----- Message ----- From: Leavy Cella, [facility] Sent: 09/21/2018  10:11 AM EDT To: Lavera Guise, RPH Subject: RE: patient updates                            Almyra Free,   I will plan to see him again in office.  Thanks for letting me know. [name] is a good option. Let's plan to proceed on that.   ----- Message ----- From: Lavera Guise, [facility] Sent: 09/20/2018   2:30 PM EDT To: Leavy Cella, RPH Subject: patient updates                                Hello!  Patient has called requesting pill packaging.  Currently on insulin (lantus max 30 units QD), jardiance, glipizide for DM.  He is still reporting high 100s-200 for FBG (although not checking daily).  Last A1c 14.1 on 05/14/2018---so maybe trending down!  I wanted to see if patient needed follow up or adjustments to current regimen before packaging medications.  Also, we would like to switch him to [place] if possible since we can get it free!  Thank you! I told patient to be on standby! Almyra Free

## 2018-09-28 NOTE — Telephone Encounter (Signed)
Attempted phone call for DM assessment  - left message I would try again 6/8

## 2018-10-01 ENCOUNTER — Telehealth: Payer: Self-pay | Admitting: Pharmacist

## 2018-10-01 NOTE — Telephone Encounter (Signed)
-----   Message from Lavera Guise, [facility] sent at 09/21/2018 11:47 AM EDT ----- Regarding: RE: patient updates Okay! I did find out that patient does not want to come in for OV because of COVID, but is agreeable to video or phone.   Thanks! Almyra Free ----- Message ----- From: Leavy Cella, [facility] Sent: 09/21/2018  10:11 AM EDT To: Lavera Guise, RPH Subject: RE: patient updates                            Almyra Free,   I will plan to see him again in office.  Thanks for letting me know. [name] is a good option. Let's plan to proceed on that.   ----- Message ----- From: Lavera Guise, [facility] Sent: 09/20/2018   2:30 PM EDT To: Leavy Cella, RPH Subject: patient updates                                Hello!  Patient has called requesting pill packaging.  Currently on insulin (lantus max 30 units QD), jardiance, glipizide for DM.  He is still reporting high 100s-200 for FBG (although not checking daily).  Last A1c 14.1 on 05/14/2018---so maybe trending down!  I wanted to see if patient needed follow up or adjustments to current regimen before packaging medications.  Also, we would like to switch him to [place] if possible since we can get it free!  Thank you! I told patient to be on standby! Almyra Free

## 2018-10-01 NOTE — Telephone Encounter (Signed)
Attempted phone call to both numbers in patient chart.   Ideally, would like to determine blood sugar control outside of the office without face-to-face visit.   No answer Left message I would plan to return call again.

## 2018-10-03 NOTE — Telephone Encounter (Signed)
Attempted to contact patient for Diabetes Follow-Up  No answer - left message I would try to call again on 6/11

## 2018-10-04 ENCOUNTER — Other Ambulatory Visit: Payer: Self-pay | Admitting: Pharmacy Technician

## 2018-10-04 NOTE — Patient Outreach (Signed)
[place] [facility]) Care Management  10/04/2018  [name] 1950/10/27 [id number]   Received patient portion(s) of patient assistance application for [employer]. Re-faxed provider portion of application to Dr. Enid Derry for completion.  Will fax to [place] once document has been received.  Maud Deed Chana Bode [place] Certified Pharmacy Technician [place] Management Direct Dial:[phone]

## 2018-10-04 NOTE — Telephone Encounter (Signed)
Attempted to contact patient to discuss blood sugars, left the third HIPAA compliant voicemail for him to return our call at his convenience.   Will notify [facility] pharmacist Lottie Dawson that I have been unable to get in contact with patient

## 2018-10-08 ENCOUNTER — Other Ambulatory Visit: Payer: Self-pay | Admitting: Pharmacy Technician

## 2018-10-08 NOTE — Telephone Encounter (Signed)
-----   Message from Lavera Guise, [facility] sent at 10/04/2018  1:48 PM EDT ----- I usually text his wife and then they call me on speaker--[phone].  They just mailed back Basaglar application we can get free--messaged Dr. Martinique. ----- Message ----- From: Leavy Cella, [facility] Sent: 10/04/2018   1:27 PM EDT To: Lavera Guise, RPH  Almyra Free (this is Catie and Laurey Arrow),   Edwena Bunde been unable to get in contact with patient. If you speak with him, please ask him to call clinic and set up a virtual visit with Dr. Valentina Lucks.   Pete/Catie

## 2018-10-08 NOTE — Patient Outreach (Signed)
[place] [facility]) Care Management  10/08/2018  Bronsen Serano 01/10/1951 [id number]   Received provider portion(s) of patient assistance application for Basaglar. Faxed completed application and required documents into [employer].  Will follow up with company in 5-7 business days to check status of application.  Maud Deed Chana Bode [place] Certified Pharmacy Technician [place] Management Direct Dial:[phone]

## 2018-10-08 NOTE — Telephone Encounter (Signed)
Wife called back, Dr. Valentina Lucks not available.   Pt has appt on Thursday.  She states they can discuss blood sugars at that time or he can call her back.  Christen Bame, CMA

## 2018-10-08 NOTE — Telephone Encounter (Signed)
Attempted contact with "emergency contact" - left message requesting call back RE patient.   Plan to discuss blood sugar control and insulin dosing at that time.

## 2018-10-11 ENCOUNTER — Other Ambulatory Visit: Payer: Self-pay

## 2018-10-11 ENCOUNTER — Ambulatory Visit (INDEPENDENT_AMBULATORY_CARE_PROVIDER_SITE_OTHER): Payer: Medicare HMO | Admitting: Pharmacist

## 2018-10-11 VITALS — BP 161/90 | Wt 200.0 lb

## 2018-10-11 DIAGNOSIS — E119 Type 2 diabetes mellitus without complications: Secondary | ICD-10-CM | POA: Diagnosis not present

## 2018-10-11 DIAGNOSIS — E7800 Pure hypercholesterolemia, unspecified: Secondary | ICD-10-CM | POA: Diagnosis not present

## 2018-10-11 DIAGNOSIS — I10 Essential (primary) hypertension: Secondary | ICD-10-CM | POA: Diagnosis not present

## 2018-10-11 LAB — POCT GLYCOSYLATED HEMOGLOBIN (HGB A1C): HbA1c, POC (controlled diabetic range): 10.2 % — AB (ref 0.0–7.0)

## 2018-10-11 MED ORDER — CHLORTHALIDONE 25 MG PO TABS: 12.5000 mg | ORAL_TABLET | Freq: Every day | ORAL | 2 refills | Status: DC

## 2018-10-11 MED ORDER — METFORMIN HCL 500 MG PO TABS: 1000.0000 mg | ORAL_TABLET | Freq: Two times a day (BID) | ORAL | 3 refills | Status: DC

## 2018-10-11 MED ORDER — LANTUS SOLOSTAR 100 UNIT/ML ~~LOC~~ SOPN: 26.0000 [IU] | PEN_INJECTOR | Freq: Every day | SUBCUTANEOUS | 0 refills | Status: DC

## 2018-10-11 NOTE — Patient Instructions (Addendum)
It was great to see you today!  We are going to make a few changes:  1) INCREASE your insulin (Lantus/Basaglar) to 26 units daily. Takes this dose every day.  2) We are restarting metformin with 500 mg tablets. These should be smaller. We want to eventually take 2 tablets in the morning and 2 tablets in the evening. You can take 1 tablet twice daily for about a week to make sure your stomach doesn't get upset. Keep checking your blood sugars twice daily - first thing in the morning and then 2 hours after meals.   3) Start a new medication for blood sugar called chlorthalidone 12.5 mg. Take this in the morning. Keep checking your blood pressure once daily and bring those results back to [place]. Continue taking losartan 100 mg and amlodipine 10 mg. Move the amlodipine to the evening.   This is when you should take your medications:   Morning: - Jardiance 25 mg  - Chlorthalidone 12.5 mg  - Losartan 100 mg  - Metformin 1000 mg - 2 500 mg tablets  - Glipizide 10 mg    Evening:  - Aspirin 81 mg  - Rosuvastatin 20 mg  - Amlodipine 10 mg  - Metformin 1000 mg - 2 500 mg tablets   We'll talk to Lottie Dawson about setting you up with pill packs. She or Etter Sjogren will be in touch about getting the insulin for free from the manufacturer.

## 2018-10-11 NOTE — Progress Notes (Signed)
S:     Chief Complaint  Patient presents with  . Medication Management    Diabetes    Patient arrives in pleasant spirits, ambulating without assistance.  Presents for diabetes evaluation, education, and management at the request of PCP Martinique Shirley, DO after last visit on 05/14/2018. Last seen in pharmacy clinic on 05/24/2018, though has had a handful of phone visits.   Today, patient notes feeling much better with the improvement in his blood sugars. He has been checking BG and BP daily and recording in a notebook that he brings today, along with his meter and medications.    He states that he takes 15 units of Lantus when his BG were "low" and 20 units when they were "high".  Insurance coverage/medication affordability: [employer] - Working on [employer], [employer] through patient assistance   Patient denies adherence with medications- notes that he occasionally misses doses.  Current diabetes medications include: Jardiance 25 mg (empagliflozin) QPM, glipizide 10 mg QAM, Lantus 15 or 20 units QAM - Stopped metformin 1000 mg tablets d/t tablet size and not being able to swallow Current hypertension medications include: losartan 100 mg QAM, amlodipine 10 mg QAM Current hyperlipidemia medications include: rosuvastatin 20 mg QPM  Patient denies hypoglycemic events.  Patient reported dietary habits: Eats 1-2 meals/day Breakfast: Occasionally; toast, cereal, small serving of orange juice occasionally; egg; coffee + 1 serving creamer  Lunch: Very infrequently Dinner:6-7 pm; chicken or fish + vegetable; little  Snacks: 11pm evening; 1 cookie  Drinks: Water; tea    Patient-reported exercise habits: once and a while walks at the school for ~2-3 hours a few days a week; has been mowing lawns lately; misses going to the gym (would walk ~ 7 miles per day)    Patient reports nocturia.  Patient denies neuropathy. Patient denies visual changes. Patient reports self foot exams.    O:  Physical  Exam Constitutional:      Appearance: Normal appearance.  Neurological:     Mental Status: He is alert.  Psychiatric:        Mood and Affect: Mood normal.     Review of Systems  All other systems reviewed and are negative.    Lab Results  Component Value Date   HGBA1C 10.2 (A) 10/11/2018   Vitals:   10/11/18 1521  BP: (!) 161/90    Lipid Panel     Component Value Date/Time   CHOL 246 (H) 06/07/2017 1603   TRIG 124 06/07/2017 1603   HDL 77 06/07/2017 1603   CHOLHDL 3.2 06/07/2017 1603   LDLCALC 144 (H) 06/07/2017 1603   Home BG:  7 day average: 235 14 day average: 243  Home BP: 160-170/80-90s  Clinical ASCVD: No  The 10-year ASCVD risk score Mikey Bussing DC Jr., et al., 2013) is: 41.8%   Values used to calculate the score:     Age: 60 years     Sex: Male     Is Non-Hispanic African American: Yes     Diabetic: Yes     Tobacco smoker: No     Systolic Blood Pressure: 782 mmHg     Is BP treated: Yes     HDL Cholesterol: 77 mg/dL     Total Cholesterol: 246 mg/dL   A/P: Type 2 diabetes mellitus - uncontrolled Diabetes longstanding currently uncontrolled, though improved per A1c result today. Patient is not adherent with medication. States that he occasionally misses dose -Increased dose of basal insulin Basaglar (insulin glargine) from 15-20 units  to 26 units daily. Will collaborate with [facility] pharmacist Lottie Dawson to assess BG in 2 weeks. Instructed patient to check BG at home in the morning and at dinner time. Performed teach-back and patient was able to recall instructions.   -Restarted metformin 500 mg BID x 1 week, then 1000 mg BID afterwards if tolerated using metformin 500 mg tablets d/t smaller size. Discussed potential GI side effects with increasing dose of metformin.  -Reported nocturia and took Jardiance in the evening. Instructed patient to take Jardiance in the morning.  -Extensively discussed pathophysiology of DM, recommended lifestyle interventions,  dietary effects on glycemic control -Counseled on s/sx of and management of hypoglycemia.  ASCVD risk - primary in patient with DM.  ASCVD risk score is >20%  - high intensity statin indicated. Aspirin is indicated.  -Continued aspirin 81 mg  -Continued rosuvastatin 20 mg.   Hypertension - uncontrolled  Hypertension longstanding and currently uncontrolled. BP today 161/90. BP goal <130/80 mmHg. Patient is not adherent with medication. Patient admits to occasionally missing doses of medication. -Continued amlodipine 10 mg  -Continued losartan 100 mg  -Started chlorthalidone 12.5 mg qAM. Encouraged to take in the morning. Encouraged patient to move amlodipine to the evening for CV risk reduction.   Medication Management:  Provided patient with a list of which medications to take at which time of day  AM: Jardiance, Chlorthalidone, Losartan, Metformin, Glipizide PM: Aspirin, Metformin, Rosuvastatin - Labeled medication bottles with "AM or "PM" to help patient, as he admitted he has poor literacy and health literacy  - Will collaborate with [facility] pharmacist Lottie Dawson to pursue options for pill packaging to aid patient in adherence.  Written patient instructions provided.  Total time in face to face counseling 60 minutes.   Follow up Pharmacist/PCP in 1 month. Clinic Visit in 60 min.   Patient seen with Evangeline Gula PharmD Candidate and Catie Darnelle Maffucci, PharmD, PGY2 Pharmacy Resident  Lab Follow-up:  LDL is now 60 on rosuvastatin.  No change in tx.

## 2018-10-12 LAB — LIPID PANEL
Chol/HDL Ratio: 2.2 ratio (ref 0.0–5.0)
Cholesterol, Total: 152 mg/dL (ref 100–199)
HDL: 70 mg/dL (ref >39)
LDL Calculated: 60 mg/dL (ref 0–99)
Triglycerides: 111 mg/dL (ref 0–149)
VLDL Cholesterol Cal: 22 mg/dL (ref 5–40)

## 2018-10-12 LAB — BASIC METABOLIC PANEL
BUN/Creatinine Ratio: 18 (ref 10–24)
BUN: 21 mg/dL (ref 8–27)
CO2: 21 mmol/L (ref 20–29)
Calcium: 10.4 mg/dL — ABNORMAL HIGH (ref 8.6–10.2)
Chloride: 102 mmol/L (ref 96–106)
Creatinine, Ser: 1.2 mg/dL (ref 0.76–1.27)
GFR calc Af Amer: 71 mL/min/{1.73_m2} (ref >59)
GFR calc non Af Amer: 62 mL/min/{1.73_m2} (ref >59)
Glucose: 174 mg/dL — ABNORMAL HIGH (ref 65–99)
Potassium: 4.5 mmol/L (ref 3.5–5.2)
Sodium: 139 mmol/L (ref 134–144)

## 2018-10-12 NOTE — Assessment & Plan Note (Addendum)
ASCVD risk - primary in patient with DM.  ASCVD risk score is >20%  - high intensity statin indicated. Aspirin is indicated.  -Continued aspirin 81 mg  -Continued rosuvastatin 20 mg.   Lab Follow-up: LDL is now 60 on rosuvastatin.  No change in tx.

## 2018-10-12 NOTE — Assessment & Plan Note (Signed)
Type 2 diabetes mellitus - uncontrolled Diabetes longstanding currently uncontrolled, though improved per A1c result today. Patient is not adherent with medication. States that he occasionally misses dose -Increased dose of basal insulin Basaglar (insulin glargine) from 15-20 units to 26 units daily. Will collaborate with [facility] pharmacist Lottie Dawson to assess BG in 2 weeks. Instructed patient to check BG at home in the morning and at dinner time. Performed teach-back and patient was able to recall instructions.   -Restarted metformin 500 mg BID x 1 week, then 1000 mg BID afterwards if tolerated using metformin 500 mg tablets d/t smaller size. Discussed potential GI side effects with increasing dose of metformin.  -Reported nocturia and took Jardiance in the evening. Instructed patient to take Jardiance in the morning.  -Extensively discussed pathophysiology of DM, recommended lifestyle interventions, dietary effects on glycemic control -Counseled on s/sx of and management of hypoglycemia.  ASCVD risk - primary in patient with DM.  ASCVD risk score is >20%  - high intensity statin indicated. Aspirin is indicated.  -Continued aspirin 81 mg  -Continued rosuvastatin 20 mg.

## 2018-10-12 NOTE — Assessment & Plan Note (Signed)
Hypertension - uncontrolled  Hypertension longstanding and currently uncontrolled. BP today 161/90. BP goal <130/80 mmHg. Patient is not adherent with medication. Patient admits to occasionally missing doses of medication. -Continued amlodipine 10 mg  -Continued losartan 100 mg  -Started chlorthalidone 12.5 mg qAM. Encouraged to take in the morning. Encouraged patient to move amlodipine to the evening for CV risk reduction.

## 2018-10-15 ENCOUNTER — Other Ambulatory Visit: Payer: Self-pay | Admitting: Pharmacy Technician

## 2018-10-15 NOTE — Patient Outreach (Signed)
[place]) Care Management  10/15/2018  [name] 01/09/1951 [id number]    Follow up call placed to [facility] regarding patient assistance application(s) for [name] confirms patient has been approved as of 6/20 until 04/25/19. Faxed script portion of application into [facility] pharmacy.  Follow up:  Will follow up with [facility] in 3-5 business days to ship shipment status.  Maud Deed Chana Bode [place] Certified Pharmacy Technician [place] Management Direct Dial:[phone]

## 2018-10-17 NOTE — Progress Notes (Signed)
Patient ID: [name], male   DOB: [birth year], 68 y.o.   MRN: [id number] Reviewed: I agree with Dr. Graylin Shiver documentation and management.

## 2018-10-24 ENCOUNTER — Other Ambulatory Visit: Payer: Self-pay | Admitting: Pharmacist

## 2018-10-24 NOTE — Patient Outreach (Signed)
[facility]) Care Management  [place]  10/24/2018  [name] 12-22-1950 [id number]  Outreach:  Successful outreach to patient & wife to discuss medication assistance programs.  Telephone number provided for patient to arrange shipment for Basaglar (insulin glargine) through [employer] [phone]).  Patient verbalizes understanding.  Telephone number provided for Jardiance refills through [employer].    Patient encouraged to continue his progress towards lowering his A1c.  He has decreased his A1c from 14.2-->10.3.  His LLD has also decreased from 144-->60.  Encouraged patient to continue his hard work.   Plan:  -I will follow up with patient next month  Regina Eck, PharmD, [place]  [phone]

## 2018-10-25 ENCOUNTER — Ambulatory Visit: Payer: Self-pay | Admitting: Pharmacist

## 2018-11-06 ENCOUNTER — Other Ambulatory Visit: Payer: Self-pay | Admitting: Pharmacist

## 2018-11-06 NOTE — Patient Outreach (Signed)
[facility]) Care Management [place]  11/06/2018  [name] [birth year] [id number]  Reason for referral: medication management & assistance   Successful outreach to patient & wife to discuss medications.  HIPAA identifiers verified x2.  Patient states he is doing well today.  He reports his FBG<130.  He continues to deny hypoglycemic episodes.  Patient continues to be motivated to optimize glycemic control.  He is very proud of his lower A1c 14.2-->10.3.  His LDL has also decreased from 144-->60.  Encouraged patient to continue his hard work.  Telephone number provided for patient to arrange shipment for Basaglar (insulin glargine) through [employer] [phone]) at last visit.  Telephone number provided for Jardiance refills through [employer].  Patient & wife verbalizes understanding.   Regina Eck, PharmD, BCPS Clinical Pharmacist, [facility] [place]: [phone]

## 2018-11-08 ENCOUNTER — Ambulatory Visit: Payer: Medicare HMO | Admitting: Pharmacist

## 2018-11-12 ENCOUNTER — Ambulatory Visit: Payer: Self-pay | Admitting: Pharmacist

## 2018-11-13 ENCOUNTER — Telehealth: Payer: Self-pay | Admitting: *Deleted

## 2018-11-13 NOTE — Telephone Encounter (Signed)
-----   Message from Martinique Shirley, DO sent at 11/13/2018 10:27 AM EDT ----- Regarding: FW: Patient meds  ----- Message ----- From: [name], CMA Sent: 11/13/2018  10:10 AM EDT To: Martinique Shirley, DO Subject: Patient meds                                   This patient's basaglar arrived today, it's in the vaccine fridge if someone will contact him to come pick it up.   Thanks!

## 2018-11-13 NOTE — Telephone Encounter (Signed)
LM for patient that medication has arrived and that he can pick up any time.  Evellyn Tuff,CMA

## 2018-11-15 ENCOUNTER — Ambulatory Visit (INDEPENDENT_AMBULATORY_CARE_PROVIDER_SITE_OTHER): Payer: Medicare HMO | Admitting: Pharmacist

## 2018-11-15 ENCOUNTER — Other Ambulatory Visit: Payer: Self-pay

## 2018-11-15 ENCOUNTER — Encounter: Payer: Self-pay | Admitting: Pharmacist

## 2018-11-15 DIAGNOSIS — E119 Type 2 diabetes mellitus without complications: Secondary | ICD-10-CM | POA: Diagnosis not present

## 2018-11-15 MED ORDER — BASAGLAR KWIKPEN 100 UNIT/ML ~~LOC~~ SOPN: 30.0000 [IU] | PEN_INJECTOR | Freq: Every day | SUBCUTANEOUS | 11 refills | Status: DC

## 2018-11-15 NOTE — Assessment & Plan Note (Signed)
Diabetes longstanding and currently uncontrolled at A1c 10.7 and average BG 206. [name] Patient reports adherance with medications. -Increased dose of basal insulin Lantus (insulin glargine) from 26 units to 30 units every morning. -Continued SGLT2-I Jardiance (empagliflozin) at 25mg  daily. - Continue metformin 1000 BID and glipizide 10mg  daily. -Extensively discussed pathophysiology of DM, recommended lifestyle interventions, dietary effects on glycemic control -Counseled on s/sx of and management of hypoglycemia -Next A1C anticipated 12/2018.

## 2018-11-15 NOTE — Progress Notes (Signed)
S:     Chief Complaint  Patient presents with  . Medication Management    Diabetes    Patient arrives in good spirits, ambulating without assistance.  Presents for diabetes evaluation, education, and management Patient was referred and last contact via telephone by Primary Care Provider, Dr. Enid Derry in June.    Patient reports Diabetes was diagnosed in ~ 1995.   Family/Social History: Mother, Brother, Brother with amputation due to DM  Insurance coverage/medication affordability: [employer] Medicare  Patient reports adherence with medications.  Current diabetes medications include: metformin, Lantus, Jardiance (empagliflozin), and glipizide Current hypertension medications include: amlodipine, chlorthalidone, and losartan Current hyperlipidemia medications include: rosuvastatin  Patient denies hypoglycemic events.  Patient reported dietary habits: Eats 2 meals/day. Mainly carbohydrates and protein. Trying to increase vegetables.  Drinks:  Water, orange juice occasionally, and rarely beer (non-alcoholic)  Patient-reported exercise habits: Walks at track since his [employer] is closed.  He mowes the lawn 3-4 days per week.    Patient denies nocturia.  Patient denies neuropathy. Patient denies visual changes.  Eye Exam planned in near future.  Patient reports self foot exams.    O:  Physical Exam Vitals signs reviewed.  Constitutional:      Appearance: Normal appearance. He is normal weight.  Neurological:     Mental Status: He is alert.  Psychiatric:        Mood and Affect: Mood normal.        Behavior: Behavior normal.        Thought Content: Thought content normal.    Review of Systems  Eyes: Negative for blurred vision.  Genitourinary: Negative for urgency.  All other systems reviewed and are negative.   Lab Results  Component Value Date   HGBA1C 10.2 (A) 10/11/2018   Vitals:   11/15/18 1506  BP: (!) 142/82  Pulse: (!) 103  SpO2: 98%    Lipid Panel      Component Value Date/Time   CHOL 152 10/11/2018 1526   TRIG 111 10/11/2018 1526   HDL 70 10/11/2018 1526   CHOLHDL 2.2 10/11/2018 1526   LDLCALC 60 10/11/2018 1526    Home fasting CBG: 7 day average 206    Clinical ASCVD: Yes  The 10-year ASCVD risk score Mikey Bussing DC Jr., et al., 2013) is: 31.5%   Values used to calculate the score:     Age: 68 years     Sex: Male     Is Non-Hispanic African American: Yes     Diabetic: Yes     Tobacco smoker: No     Systolic Blood Pressure: 051 mmHg     Is BP treated: Yes     HDL Cholesterol: 70 mg/dL     Total Cholesterol: 152 mg/dL    A/P: Diabetes longstanding and currently uncontrolled at A1c 10.7 and average BG 206. Marland Kitchen Patient reports adherance with medications. -Increased dose of basal insulin Lantus (insulin glargine) from 26 units to 30 units every morning. -Continued SGLT2-I Jardiance (empagliflozin) at 25mg  daily. - Continue metformin 1000 BID and glipizide 10mg  daily. -Extensively discussed pathophysiology of DM, recommended lifestyle interventions, dietary effects on glycemic control -Counseled on s/sx of and management of hypoglycemia -Next A1C anticipated 12/2018.   ASCVD risk - secondary prevention in patient with DM. Last LDL is controlled. ASCVD risk score is >20%  - high intensity statin indicated. Aspirin is indicated.  -Continued aspirin 81 mg  -Continued rosuvastatin 20 mg.   Hypertension longstanding and currently controlled.  BP goal =  130/80 mmHg. Patient is adherent with medication.  Written patient instructions provided.  Total time in face to face counseling 30 minutes.   Follow up Pharmacist/PCP in 2 weeks. Patient seen with Acie Fredrickson, PharmD Candidate.

## 2018-11-15 NOTE — Patient Instructions (Signed)
Great to see you today.   Increase your insulin to 30 units once daily  Basaglar replaces your Lantus when finished.   Next visit with Dr. Martinique in 2-3 months.

## 2018-11-16 NOTE — Progress Notes (Signed)
Reviewed: Agree with Dr. Koval's documentation and management. 

## 2018-11-26 ENCOUNTER — Ambulatory Visit: Payer: Self-pay | Admitting: Pharmacist

## 2018-12-17 ENCOUNTER — Other Ambulatory Visit: Payer: Self-pay | Admitting: Pharmacist

## 2018-12-17 ENCOUNTER — Ambulatory Visit: Payer: Self-pay | Admitting: Pharmacist

## 2018-12-17 NOTE — Patient Outreach (Signed)
[place] [facility]) Care Management  [place]  12/17/2018  Keiser Latimer Jul 11, 1950 [id number]   Reason for referral: Medication Management/Diabetes  Reason for call: routine telephone call/Diabetes check  Outreach:  Unsuccessful telephone call attempt #1 to patient.   HIPAA compliant voicemail left requesting a return call  Plan:  -I will make another outreach attempt to patient within 5-7 business days.    Regina Eck, PharmD, [place]  [phone]

## 2018-12-21 ENCOUNTER — Other Ambulatory Visit: Payer: Self-pay | Admitting: Pharmacist

## 2018-12-21 ENCOUNTER — Ambulatory Visit: Payer: Self-pay | Admitting: Pharmacist

## 2018-12-21 NOTE — Patient Outreach (Signed)
[facility]) Care Management [place]  12/21/2018  Thorn Phouthavong 1950-09-20 [id number]  Reason for referral: medication managment  Successful outreach to patient with HIPAA identifiers verified x2.  Patient states he is doing well today.  He reports his FBG is in the "100s".  He states he is very proud of this.  He continues to deny hypoglycemic episodes.  Patient continues to be motivated to optimize glycemic control.  He is very proud of his lower A1c 14.2-->10.3. His LDL has also decreased from 144-->60. Patient denies needing refills from patient assistance companies.  He continues on metformin, Lantus, Jardiance (empagliflozin), and glipizide for his diabetes.  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  Encouraged patient to continue his hard work.  PLAN: -[place] to sign off as Dr. Valentina Lucks will continue care  Regina Eck, PharmD, BCPS Clinical Pharmacist, [facility] [place]: [phone]

## 2018-12-25 DIAGNOSIS — E119 Type 2 diabetes mellitus without complications: Secondary | ICD-10-CM | POA: Diagnosis not present

## 2018-12-25 DIAGNOSIS — H524 Presbyopia: Secondary | ICD-10-CM | POA: Diagnosis not present

## 2018-12-25 LAB — HM DIABETES EYE EXAM

## 2019-01-06 ENCOUNTER — Other Ambulatory Visit: Payer: Self-pay | Admitting: Family Medicine

## 2019-01-06 DIAGNOSIS — I10 Essential (primary) hypertension: Secondary | ICD-10-CM

## 2019-01-29 ENCOUNTER — Encounter: Payer: Self-pay | Admitting: Family Medicine

## 2019-02-05 ENCOUNTER — Ambulatory Visit (INDEPENDENT_AMBULATORY_CARE_PROVIDER_SITE_OTHER): Payer: Medicare HMO | Admitting: Family Medicine

## 2019-02-05 ENCOUNTER — Other Ambulatory Visit: Payer: Self-pay

## 2019-02-05 ENCOUNTER — Encounter: Payer: Self-pay | Admitting: Family Medicine

## 2019-02-05 VITALS — BP 122/62 | HR 93 | Ht 65.0 in | Wt 205.4 lb

## 2019-02-05 DIAGNOSIS — E119 Type 2 diabetes mellitus without complications: Secondary | ICD-10-CM

## 2019-02-05 DIAGNOSIS — I10 Essential (primary) hypertension: Secondary | ICD-10-CM

## 2019-02-05 DIAGNOSIS — Z23 Encounter for immunization: Secondary | ICD-10-CM

## 2019-02-05 DIAGNOSIS — Z955 Presence of coronary angioplasty implant and graft: Secondary | ICD-10-CM | POA: Diagnosis not present

## 2019-02-05 LAB — POCT GLYCOSYLATED HEMOGLOBIN (HGB A1C): HbA1c, POC (controlled diabetic range): 8.1 % — AB (ref 0.0–7.0)

## 2019-02-05 NOTE — Patient Instructions (Signed)
Thank you for coming to see me today. It was a pleasure! Today we talked about:   Congratulations on your diabetic control!  I am so happy that you are doing so well!  You do not worry about any blood sugars unless they go below 80.  Please follow-up with me in 3 months or sooner as needed.  If you have any questions or concerns, please do not hesitate to call the office at [phone].  Take Care,   Martinique Yvan Dority, DO

## 2019-02-05 NOTE — Progress Notes (Signed)
  Subjective:  Patient ID: Perri Franckowiak  DOB: May 13, 1950 MRN: [id number]  Sudarshan Delpino is a 68 y.o. male with a PMH of T2DM, HTN, CAD, low-level of literacy, HLD, here today for f/u diabetes.   HPI:  Diabetes: - Blood Sugar ranges-127-174 - Medications: Basal-glar 30 Units, metformin, jardiance, glipizide, Compliant: yes  - On Aspirin, and on statin - Last eye exam: recently  - Last foot exam: uptodate ROS: denies hypoglycemic sx, dizziness, diaphoresis, LOC, polyuria, polydipsia  Monitoring Labs and Parameters - Last A1C:  Lab Results  Component Value Date   HGBA1C 8.1 (A) 02/05/2019   Hypertension: - Medications: amlodipine, chlorthalidone, losartan - Compliance: yes - Denies any SOB, CP, vision changes, LE edema, medication SEs, or symptoms of hypotension - Diet: watching what he eats - Exercise: working in yards  ROS: As mentioned in HPI  Family hx: Cardiovascular disease, Diabetes   Social hx: Denies use of illicit drugs, alcohol use- working yardwork Smoking status reviewed  Patient Active Problem List   Diagnosis Date Noted  . Low-level of literacy 05/21/2018  . Globus sensation 03/12/2018  . Type 2 diabetes mellitus without complication, without long-term current use of insulin ([place]) 06/09/2017  . Pure hypercholesterolemia 06/09/2017  . H/O heart artery stent 06/09/2017  . Essential hypertension, benign 06/09/2017     Objective:  BP 122/62   Pulse 93   Ht 5\' 5"  (1.651 m)   Wt 205 lb 6 oz (93.2 kg)   SpO2 96%   BMI 34.18 kg/m   Vitals and nursing note reviewed  General: NAD, pleasant Pulm: normal effort Extremities: no edema or cyanosis. WWP. Skin: warm and dry, no rashes noted Neuro: alert and oriented, no focal deficits Psych: normal affect, normal thought content  Assessment & Plan:   Type 2 diabetes mellitus without complication, without long-term current use of insulin (H[facility]) A1c 8.1 today much improved last visit.  Patient to continue basal  insulin at 30 units every morning.  He will also continue Jardiance, metformin and glipizide.  No episodes of hypoglycemia.  Discussed changes in dietary habits such as not drinking orange juice when his CBG is only 120 and he is asymptomatic.  Educated that low blood sugars would be less than 80.  -Repeat A1c in 3 months  Essential hypertension, benign Patient well-controlled on amlodipine, chlorthalidone and losartan.  Continue current treatment.  H/O heart artery stent On aspirin and statin daily.   Martinique Shanikqua Zarzycki, DO Family Medicine Resident PGY-3

## 2019-02-06 NOTE — Assessment & Plan Note (Signed)
Patient well-controlled on amlodipine, chlorthalidone and losartan.  Continue current treatment.

## 2019-02-06 NOTE — Assessment & Plan Note (Signed)
On aspirin and statin daily.

## 2019-02-06 NOTE — Assessment & Plan Note (Signed)
A1c 8.1 today much improved last visit.  Patient to continue basal insulin at 30 units every morning.  He will also continue Jardiance, metformin and glipizide.  No episodes of hypoglycemia.  Discussed changes in dietary habits such as not drinking orange juice when his CBG is only 120 and he is asymptomatic.  Educated that low blood sugars would be less than 80.  -Repeat A1c in 3 months

## 2019-02-07 ENCOUNTER — Other Ambulatory Visit: Payer: Self-pay

## 2019-02-07 ENCOUNTER — Encounter (HOSPITAL_COMMUNITY): Payer: Self-pay | Admitting: Emergency Medicine

## 2019-02-07 ENCOUNTER — Emergency Department (HOSPITAL_COMMUNITY): Payer: Medicare HMO

## 2019-02-07 ENCOUNTER — Emergency Department (HOSPITAL_COMMUNITY)
Admission: EM | Admit: 2019-02-07 | Discharge: 2019-02-07 | Disposition: A | Discharge status: Home / Self Care | Payer: Medicare HMO | Attending: Emergency Medicine | Admitting: Emergency Medicine

## 2019-02-07 DIAGNOSIS — Z87891 Personal history of nicotine dependence: Secondary | ICD-10-CM | POA: Insufficient documentation

## 2019-02-07 DIAGNOSIS — Z794 Long term (current) use of insulin: Secondary | ICD-10-CM | POA: Insufficient documentation

## 2019-02-07 DIAGNOSIS — Y999 Unspecified external cause status: Secondary | ICD-10-CM | POA: Insufficient documentation

## 2019-02-07 DIAGNOSIS — Y929 Unspecified place or not applicable: Secondary | ICD-10-CM | POA: Diagnosis not present

## 2019-02-07 DIAGNOSIS — I10 Essential (primary) hypertension: Secondary | ICD-10-CM | POA: Insufficient documentation

## 2019-02-07 DIAGNOSIS — W11XXXA Fall on and from ladder, initial encounter: Secondary | ICD-10-CM | POA: Insufficient documentation

## 2019-02-07 DIAGNOSIS — Y93H9 Activity, other involving exterior property and land maintenance, building and construction: Secondary | ICD-10-CM | POA: Insufficient documentation

## 2019-02-07 DIAGNOSIS — Z7982 Long term (current) use of aspirin: Secondary | ICD-10-CM | POA: Insufficient documentation

## 2019-02-07 DIAGNOSIS — S8251XA Displaced fracture of medial malleolus of right tibia, initial encounter for closed fracture: Secondary | ICD-10-CM | POA: Diagnosis not present

## 2019-02-07 DIAGNOSIS — E119 Type 2 diabetes mellitus without complications: Secondary | ICD-10-CM | POA: Insufficient documentation

## 2019-02-07 DIAGNOSIS — S82891A Other fracture of right lower leg, initial encounter for closed fracture: Secondary | ICD-10-CM

## 2019-02-07 DIAGNOSIS — Z79899 Other long term (current) drug therapy: Secondary | ICD-10-CM | POA: Insufficient documentation

## 2019-02-07 DIAGNOSIS — S99911A Unspecified injury of right ankle, initial encounter: Secondary | ICD-10-CM | POA: Diagnosis present

## 2019-02-07 DIAGNOSIS — M25571 Pain in right ankle and joints of right foot: Secondary | ICD-10-CM | POA: Diagnosis not present

## 2019-02-07 MED ORDER — OXYCODONE-ACETAMINOPHEN 5-325 MG PO TABS
1.0000 | ORAL_TABLET | Freq: Once | ORAL | Status: AC
  Administered 2019-02-07: 1 via ORAL
  Filled 2019-02-07: qty 1

## 2019-02-07 MED ORDER — HYDROCODONE-ACETAMINOPHEN 5-325 MG PO TABS: 1.0000 | ORAL_TABLET | Freq: Four times a day (QID) | ORAL | 0 refills | Status: DC | PRN

## 2019-02-07 NOTE — Progress Notes (Signed)
Orthopedic Tech Progress Note Patient Details:  [name] Feb 10, 1951 [id number] Had to cut pants leg of patient. "It was he suggestion"  Ortho Devices Type of Ortho Device: Crutches, Short leg splint, Stirrup splint Ortho Device/Splint Location: LRE Ortho Device/Splint Interventions: Adjustment, Application, Ordered   Post Interventions Patient Tolerated: Ambulated well, Well Instructions Provided: Poper ambulation with device, Care of device, Adjustment of device   [name] 02/07/2019, 6:19 PM

## 2019-02-07 NOTE — Discharge Instructions (Signed)
You have been diagnosed with right ankle fracture.  Use crutches for ambulation.  Wear  your splint for protection.  Take pain medication as needed.  Call and followup closely with orthopedist for further care.

## 2019-02-07 NOTE — ED Provider Notes (Signed)
[facility] EMERGENCY DEPARTMENT Provider Note   CSN: [id number] Arrival date & time: 02/07/19  1520     History   Chief Complaint Chief Complaint  Patient presents with  . Ankle Pain    HPI [name] is a 68 y.o. male.     The history is provided by the patient. No language interpreter was used.  Ankle Pain Associated symptoms: no fever      68 year old male with history of diabetes, prior MI, hypertension, former smoker presenting for evaluation of right ankle injury.  Patient does report a few hours ago he was walking down the steps of a ladder after cleaning the gutters.  Down to the last rung he accidentally twisted his right ankle and had a controlled fall.  He denies hitting his head or loss of consciousness.  He does complain of pain about the ankle in which he described as a sharp, nonradiating pain rates at 7 out of 10.  He was able to ambulate afterward.  He denies any knee or hip pain.  He denies any specific treatment tried.  He denies any precipitating symptoms prior to the injury.  He denies any prior injury to the same ankle.  Past Medical History:  Diagnosis Date  . Diabetes mellitus without complication ([place])   . Hypercholesterolemia   . Hypertension   . MI (myocardial infarction) [facility])     Patient Active Problem List   Diagnosis Date Noted  . Low-level of literacy 05/21/2018  . Globus sensation 03/12/2018  . Type 2 diabetes mellitus without complication, without long-term current use of insulin ([place]) 06/09/2017  . Pure hypercholesterolemia 06/09/2017  . H/O heart artery stent 06/09/2017  . Essential hypertension, benign 06/09/2017    Past Surgical History:  Procedure Laterality Date  . CORONARY ANGIOPLASTY WITH STENT PLACEMENT     2004/2005 per patient        Home Medications    Prior to Admission medications   Medication Sig Start Date End Date Taking? Authorizing Provider  ACCU-CHEK SOFTCLIX LANCETS lancets Use as  instructed. Up to four times per day 05/24/18   Zenia Resides, MD  amLODipine (NORVASC) 10 MG tablet Take 1 tablet (10 mg total) by mouth daily. 03/12/18   Shirley, [place], DO  aspirin EC 81 MG tablet Take 81 mg by mouth daily.    [provider]  blood glucose meter kit and supplies Dispense based on patient and insurance preference. Use up to four times daily as directed. (FOR ICD-10 E10.9, E11.9). 05/24/18   Zenia Resides, MD  Blood Glucose Monitoring Suppl (ACCU-CHEK AVIVA) device Use as instructed. Up to four times per day 05/24/18 05/24/19  Zenia Resides, MD  chlorthalidone (HYGROTON) 25 MG tablet TAKE 1/2 TABLET(12.5 MG) BY MOUTH DAILY 01/07/19   Shirley, [place], DO  empagliflozin (JARDIANCE) 25 MG TABS tablet Take 25 mg by mouth daily. 05/25/18   Zenia Resides, MD  glipiZIDE (GLUCOTROL) 10 MG tablet Take 1 tablet (10 mg total) by mouth daily before breakfast. 05/14/18   Shirley, [place], DO  glucose blood (ACCU-CHEK AVIVA) test strip 1 each by Other route 4 (four) times daily. Use as instructed. Up to four times per day 05/24/18   Zenia Resides, MD  Insulin Glargine (BASAGLAR KWIKPEN) 100 UNIT/ML SOPN Inject 0.3 mLs (30 Units total) into the skin daily. 11/15/18   Zenia Resides, MD  Insulin Pen Needle (PEN NEEDLES) 32G X 4 MM MISC 1 applicator by Does not  apply route daily. 06/13/18   Shirley, [place], DO  losartan (COZAAR) 100 MG tablet Take 1 tablet (100 mg total) by mouth daily. 05/24/18   Zenia Resides, MD  metFORMIN (GLUCOPHAGE) 500 MG tablet Take 2 tablets (1,000 mg total) by mouth 2 (two) times daily with a meal. 10/11/18   Hensel, Jamal Collin, MD  rosuvastatin (CRESTOR) 20 MG tablet Take 1 tablet (20 mg total) by mouth daily. 05/14/18   Shirley, [place], DO    Family History Family History  Problem Relation Age of Onset  . Heart disease Mother   . Diabetes Mother   . Hypertension Mother   . Alcoholism Father   . Heart disease Father   . Hypertension Sister    . Diabetes Brother   . Hypercholesterolemia Brother   . Osteoporosis Brother   . Hypertension Brother   . Kidney disease Brother     Social History Social History   Tobacco Use  . Smoking status: Former Smoker    Packs/day: 0.50    Years: 10.00    Pack years: 5.00    Types: Cigarettes    Quit date: 04/25/1993    Years since quitting: 25.8  . Smokeless tobacco: Never Used  Substance Use Topics  . Alcohol use: No    Frequency: Never  . Drug use: No     Allergies   Patient has no known allergies.   Review of Systems Review of Systems  Constitutional: Negative for fever.  Musculoskeletal: Positive for joint swelling.  Skin: Negative for wound.  Neurological: Negative for numbness.     Physical Exam Updated Vital Signs BP (!) 160/77 (BP Location: Right Arm)   Pulse 100   Temp 98.2 F (36.8 C) (Oral)   Resp 16   SpO2 99%   Physical Exam Vitals signs and nursing note reviewed.  Constitutional:      General: He is not in acute distress.    Appearance: He is well-developed.  HENT:     Head: Atraumatic.  Eyes:     Conjunctiva/sclera: Conjunctivae normal.  Neck:     Musculoskeletal: Neck supple.  Musculoskeletal:        General: Tenderness (Right ankle.  Tenderness to both medial and lateral malleoli region with surrounding erythema but no deformity noted.  No pain at fifth metatarsal region.  Dorsalis pedis pulse palpable.) present.     Comments: Right knee and right hip nontender.  Skin:    Findings: No rash.  Neurological:     Mental Status: He is alert.      ED Treatments / Results  Labs (all labs ordered are listed, but only abnormal results are displayed) Labs Reviewed - No data to display  EKG None  Radiology Dg Ankle Complete Right  Result Date: 02/07/2019 CLINICAL DATA:  Right ankle pain EXAM: RIGHT ANKLE - COMPLETE 3+ VIEW COMPARISON:  None. FINDINGS: Subtle vertically oriented lucency at the anterior tibial plafond and seen on lateral  view, possibly nondisplaced fracture. Additional small minimally displaced avulsion fracture of the inferior tip of the medial malleolus. Well corticated ossific density inferior to the tip of the lateral malleolus suggests sequela of remote trauma. There is soft tissue swelling about the anterior and medial aspects of the ankle. Small bidirectional calcaneal enthesophytes. Mild degenerative changes within the midfoot. IMPRESSION: 1. Subtle vertically oriented lucency at the anterior tibial plafond suspicious for nondisplaced fracture. 2. Minimally displaced avulsion fracture of the inferior tip of the medial malleolus. 3. Soft tissue swelling about the  ankle. Electronically Signed   By: Davina Poke M.D.   On: 02/07/2019 16:16    Procedures Procedures (including critical care time)  Medications Ordered in ED Medications  oxyCODONE-acetaminophen (PERCOCET/ROXICET) 5-325 MG per tablet 1 tablet (1 tablet Oral Given 02/07/19 1804)     Initial Impression / Assessment and Plan / ED Course  I have reviewed the triage vital signs and the nursing notes.  Pertinent labs & imaging results that were available during my care of the patient were reviewed by me and considered in my medical decision making (see chart for details).        BP (!) 160/77 (BP Location: Right Arm)   Pulse 100   Temp 98.2 F (36.8 C) (Oral)   Resp 16   SpO2 99%    Final Clinical Impressions(s) / ED Diagnoses   Final diagnoses:  Ankle fracture, right, closed, initial encounter    ED Discharge Orders         Ordered    HYDROcodone-acetaminophen (NORCO/VICODIN) 5-325 MG tablet  Every 6 hours PRN     02/07/19 1810         5:20 PM Patient twisted his right ankle when stepping down from a ladder.  Does have pain about the right ankle with associated swelling.  X-ray shows subtle vertically oriented lucency at the anterior tibial plafond suspicious for nondisplaced fracture.  Minimally displaced avulsion fracture  of the inferior tip of the medial malleolus.  Pt placed in ankle splint, crutches provided.  outpt f/u with ortho for further care.  Pt is NVI.   SPLINT APPLICATION Date/Time: 3:40 PM Authorized by: Domenic Moras Consent: Verbal consent obtained. Risks and benefits: risks, benefits and alternatives were discussed Consent given by: patient Splint applied by: orthopedic technician Location details: right ankle.  Splint type: stirrup Supplies used: ortho-glass Post-procedure: The splinted body part was neurovascularly unchanged following the procedure. Patient tolerance: Patient tolerated the procedure well with no immediate complications.      Domenic Moras, PA-C 02/07/19 1814    Sherwood Gambler, MD 02/07/19 2140

## 2019-02-07 NOTE — ED Triage Notes (Signed)
Pt was stepping off the last step of the ladder when he twisted his right ankle pt has effusion on lateral ankle.

## 2019-02-13 DIAGNOSIS — S8254XA Nondisplaced fracture of medial malleolus of right tibia, initial encounter for closed fracture: Secondary | ICD-10-CM | POA: Diagnosis not present

## 2019-02-19 ENCOUNTER — Telehealth: Payer: Self-pay | Admitting: *Deleted

## 2019-02-19 NOTE — Telephone Encounter (Signed)
LM for patient that his medication is in and he can come by the office to pick it up.  It is in the refrigerator in the medication room when he does.   Jazmin Hartsell,CMA

## 2019-02-19 NOTE — Telephone Encounter (Signed)
-----   Message from [name], [name] sent at 02/19/2019 11:19 AM EDT ----- Regarding: patient meds This patient's Basaglar came in a few min ago if someone wants to let him know he can come pick it up.   [name]!!

## 2019-02-25 DIAGNOSIS — S8254XD Nondisplaced fracture of medial malleolus of right tibia, subsequent encounter for closed fracture with routine healing: Secondary | ICD-10-CM | POA: Diagnosis not present

## 2019-03-11 DIAGNOSIS — S8254XD Nondisplaced fracture of medial malleolus of right tibia, subsequent encounter for closed fracture with routine healing: Secondary | ICD-10-CM | POA: Diagnosis not present

## 2019-04-16 ENCOUNTER — Ambulatory Visit: Payer: Self-pay | Admitting: Pharmacist

## 2019-04-21 ENCOUNTER — Other Ambulatory Visit: Payer: Self-pay | Admitting: Family Medicine

## 2019-04-23 ENCOUNTER — Other Ambulatory Visit: Payer: Self-pay

## 2019-04-23 ENCOUNTER — Ambulatory Visit: Payer: Medicare HMO | Admitting: Pharmacist

## 2019-04-23 NOTE — Progress Notes (Addendum)
Chronic Care Management   Initial Visit Note  04/16/2019 Name: [name] MRN: [id number] DOB: [birth year]  Referred by: Shirley, [place], DO Reason for referral : Chronic Care Management and Diabetes   Anthonee Gelin is a 68 y.o. year old male who is a primary care patient of Shirley, [place], DO. The CCM team was consulted for assistance with chronic disease management and care coordination needs related to HTN, HLD and DMII  Review of patient status, including review of consultants reports, relevant laboratory and other test results, and collaboration with appropriate care team members and the patient's provider was performed as part of comprehensive patient evaluation and provision of chronic care management services.    I spoke with Mr. And Mrs. Aoun by telephone today  Medications: Outpatient Encounter Medications as of 04/16/2019  Medication Sig Note  . ACCU-CHEK SOFTCLIX LANCETS lancets Use as instructed. Up to four times per day   . amLODipine (NORVASC) 10 MG tablet Take 1 tablet (10 mg total) by mouth daily.   Marland Kitchen aspirin EC 81 MG tablet Take 81 mg by mouth daily.   . blood glucose meter kit and supplies Dispense based on patient and insurance preference. Use up to four times daily as directed. (FOR ICD-10 E10.9, E11.9).   Marland Kitchen Blood Glucose Monitoring Suppl (ACCU-CHEK AVIVA) device Use as instructed. Up to four times per day   . chlorthalidone (HYGROTON) 25 MG tablet TAKE 1/2 TABLET(12.5 MG) BY MOUTH DAILY   . empagliflozin (JARDIANCE) 25 MG TABS tablet Take 25 mg by mouth daily. 12/21/2018: Medication through patient assistance  . glipiZIDE (GLUCOTROL) 10 MG tablet Take 1 tablet (10 mg total) by mouth daily before breakfast. 10/11/2018: Taking at bedtime  . glucose blood (ACCU-CHEK AVIVA) test strip 1 each by Other route 4 (four) times daily. Use as instructed. Up to four times per day   . HYDROcodone-acetaminophen (NORCO/VICODIN) 5-325 MG tablet Take 1 tablet by mouth every 6 (six) hours  as needed for moderate pain or severe pain.   . Insulin Glargine (BASAGLAR KWIKPEN) 100 UNIT/ML SOPN Inject 0.3 mLs (30 Units total) into the skin daily.   . Insulin Pen Needle (PEN NEEDLES) 32G X 4 MM MISC 1 applicator by Does not apply route daily.   Marland Kitchen losartan (COZAAR) 100 MG tablet Take 1 tablet (100 mg total) by mouth daily.   . metFORMIN (GLUCOPHAGE) 500 MG tablet Take 2 tablets (1,000 mg total) by mouth 2 (two) times daily with a meal.   . rosuvastatin (CRESTOR) 20 MG tablet Take 1 tablet (20 mg total) by mouth daily. 05/29/2018: Takes in evening    No facility-administered encounter medications on file as of 04/16/2019.     Objective:   Goals Addressed            This Visit's Progress     Patient Stated   . I would like to optimize my diabetes management (pt-stated)       Current Barriers:  . Diabetes: M6QH; complicated by chronic medical conditions including HTN, HLD most recent A1c 8.1% on 10/13 . Current antihyperglycemic regimen: Basaglar 30 units, Jardiance 33m, glipizde, metformin,  o Will re-apply for patient assistance for Basaglar/Jardiance ([employer])--patient to bring in forms to PCP office o Patient tolerating well . Denies hypoglycemic symptoms, Denies hyperglycemic symptoms . Current meal patterns: o Breakfast: eggs, bacon, 1 piece of toast o Lunch: protein/salad/light carb o Supper: fried chicken/protein, greens/veggie, discuss light carb is ok o Snacks: veggies/protein o Drinks: coffee with light creamer (no  sugar), discussed avoiding juices/sugary drinks/sodas . Current exercise: walking (recent knee surgery, but recovering) . Current blood glucose readings: 127-180 . Cardiovascular risk reduction: o Current hypertensive regimen: amlodipine, chlorthalidone, losartan o Current hyperlipidemia regimen: rosuvastatin 74m daily o Current antiplatelet regimen: ASA 81  Pharmacist Clinical Goal(s):  .Marland KitchenOver the next 90 days, patient will work  with PharmD and primary care provider to address needs related to optimized medication management of diabetes  Interventions: . Comprehensive medication review performed, medication list updated in electronic medical record . Reviewed & discussed the following diabetes-related information with patient: o Reviewed ADA recommended "diabetes-friendly" diet  (reviewed healthy snack/food options) o Discussed insulin injection technique; Patient uses AccuCheck glucometer o Reviewed medication purpose/side effects-->patient denies adverse events  Patient Self Care Activities:  . Patient will check blood glucose daily , document, and provide at future appointments . Patient will focus on medication adherence  . Patient will take medications as prescribed . Patient will contact provider with any episodes of hypoglycemia . Patient will report any questions or concerns to provider   Initial goal documentation         Mr. BHurlbutwas given information about Chronic Care Management services today including:  1. CCM service includes personalized support from designated clinical staff supervised by his physician, including individualized plan of care and coordination with other care providers 2. 24/7 contact phone numbers for assistance for urgent and routine care needs. 3. Service will only be billed when office clinical staff spend 20 minutes or more in a month to coordinate care. 4. Only one practitioner may furnish and bill the service in a calendar month. 5. The patient may stop CCM services at any time (effective at the end of the month) by phone call to the office staff. 6. The patient will be responsible for cost sharing (co-pay) of up to 20% of the service fee (after annual deductible is met).  Patient agreed to services and verbal consent obtained.   Plan:   The care management team will reach out to the patient again over the next 30 days.   Provider Signature JRegina Eck  PharmD, BCPS Clinical Pharmacist, [facility] [phone]

## 2019-04-23 NOTE — Progress Notes (Addendum)
  Chronic Care Management   Outreach Note  04/23/2019 Name: [name] MRN: [id number] DOB: 1950-10-10  Referred by: Shirley, [place], DO Reason for referral : Chronic Care Management and Diabetes   An unsuccessful telephone outreach was attempted today. The patient was referred to the case management team by for assistance with care management and care coordination.   Follow Up Plan: A HIPPA compliant phone message was left for the patient providing contact information and requesting a return call.  The care management team will reach out to the patient again over the next 7-10 days.    SIGNATURE Regina Eck, PharmD, BCPS Clinical Pharmacist, [place]: [phone]

## 2019-04-29 ENCOUNTER — Other Ambulatory Visit: Payer: Self-pay | Admitting: Pharmacy Technician

## 2019-04-29 NOTE — Patient Outreach (Signed)
[facility]) Care Management  04/29/2019  Almedin Gerlt 1950/10/10 [id number]   Received patient portion(s) of patient assistance application(s) for Jardiance from [place].    Faxed provider portion of application to Dr. S. [place]  Will fax into company once all documents have been received.  Maud Deed Chana Bode [place] Certified Pharmacy Technician [place] Management Direct [phone]

## 2019-05-08 ENCOUNTER — Other Ambulatory Visit: Payer: Self-pay | Admitting: Pharmacy Technician

## 2019-05-08 NOTE — Patient Outreach (Signed)
[facility]) Care Management  05/08/2019  Domer Loffredo [birth year] [id number]   Received patient and provider portion(s) of patient assistance application(s) for Jardiance. Faxed completed application and required documents into [employer].  Will follow up with company(ies) in 7-10 business days to check status of application(s).  Maud Deed Chana Bode [place] Certified Pharmacy Technician [place] Management Direct Dial:[phone]

## 2019-05-14 ENCOUNTER — Other Ambulatory Visit: Payer: Self-pay | Admitting: Family Medicine

## 2019-05-14 ENCOUNTER — Ambulatory Visit: Payer: Medicare HMO | Admitting: Pharmacist

## 2019-05-14 ENCOUNTER — Other Ambulatory Visit: Payer: Self-pay | Admitting: Pharmacy Technician

## 2019-05-14 ENCOUNTER — Other Ambulatory Visit: Payer: Self-pay

## 2019-05-14 DIAGNOSIS — I10 Essential (primary) hypertension: Secondary | ICD-10-CM

## 2019-05-14 NOTE — Patient Outreach (Signed)
[facility]) Care Management  05/14/2019  [name] 03-04-1951 [id number]    Follow up call placed to [place] regarding patient assistance application(s) for [name] confirms patient has been approved as of 1/18 until 04/24/20. Medication to arrive at patient's home in 7-10 business days.  Follow up:  Will route note to [place] to inform  Maud Deed. Chana Bode [place] Certified Pharmacy Technician [place] Management Direct Dial:[phone]

## 2019-05-14 NOTE — Progress Notes (Signed)
  Chronic Care Management   Outreach Note  05/14/2019 Name: [name] MRN: [id number] DOB: 07/11/1950  Referred by: Shirley, [place], DO Reason for referral : Chronic Care Management-Diabetes/medication assistance   An unsuccessful telephone outreach was attempted today. The patient was referred to the case management team by for assistance with care management and care coordination.   Follow Up Plan: A HIPPA compliant phone message was left for the patient providing contact information and requesting a return call.  The care management team will reach out to the patient again over the next 7-10 days.    SIGNATURE Regina Eck, PharmD, BCPS Clinical Pharmacist, [place]: [phone]

## 2019-05-15 ENCOUNTER — Other Ambulatory Visit: Payer: Self-pay | Admitting: *Deleted

## 2019-05-15 MED ORDER — AMLODIPINE BESYLATE 10 MG PO TABS: 10.0000 mg | ORAL_TABLET | Freq: Every day | ORAL | 3 refills | Status: DC

## 2019-05-21 ENCOUNTER — Telehealth: Payer: Medicare HMO

## 2019-05-21 ENCOUNTER — Other Ambulatory Visit: Payer: Self-pay

## 2019-05-29 ENCOUNTER — Other Ambulatory Visit: Payer: Self-pay | Admitting: Pharmacy Technician

## 2019-05-29 NOTE — Patient Outreach (Signed)
[name] Care Management  05/29/2019  [name] Feb 20, 1951 [id number]   Received patient and provider portion(s) of patient assistance application(s) for Basaglar. Faxed completed application and required documents into [employer].  Will follow up with company(ies) in 7-10 business days to check status of application(s).  [name] Certified Pharmacy Technician [place] Management Direct Dial:[phone]

## 2019-05-30 ENCOUNTER — Ambulatory Visit: Payer: Self-pay | Admitting: Pharmacist

## 2019-05-30 DIAGNOSIS — E119 Type 2 diabetes mellitus without complications: Secondary | ICD-10-CM

## 2019-05-30 NOTE — Progress Notes (Signed)
Chronic Care Management   Visit Note  05/30/2019 Name: [name] MRN: [id number] DOB: 06-30-1950  Referred by: Shirley, [place], DO Reason for referral : Chronic Care Management   [name] is a 69 y.o. year old male who is a primary care patient of Shirley, [place], DO. The CCM team was consulted for assistance with chronic disease management and care coordination needs related to DMII  Review of patient status, including review of consultants reports, relevant laboratory and other test results, and collaboration with appropriate care team members and the patient's provider was performed as part of comprehensive patient evaluation and provision of chronic care management services.    Patient was contacted via telephone for today's visit regarding his diabetes and chronic care management   Medications: Outpatient Encounter Medications as of 05/30/2019  Medication Sig Note  . ACCU-CHEK SOFTCLIX LANCETS lancets Use as instructed. Up to four times per day   . amLODipine (NORVASC) 10 MG tablet Take 1 tablet (10 mg total) by mouth at bedtime.   Marland Kitchen aspirin EC 81 MG tablet Take 81 mg by mouth daily.   . blood glucose meter kit and supplies Dispense based on patient and insurance preference. Use up to four times daily as directed. (FOR ICD-10 E10.9, E11.9).   . chlorthalidone (HYGROTON) 25 MG tablet TAKE 1/2 TABLET(12.5 MG) BY MOUTH DAILY   . empagliflozin (JARDIANCE) 25 MG TABS tablet Take 25 mg by mouth daily. 12/21/2018: Medication through patient assistance  . glipiZIDE (GLUCOTROL) 10 MG tablet TAKE 1 TABLET BY MOUTH DAILY BEFORE BREAKFAST   . glucose blood (ACCU-CHEK AVIVA) test strip 1 each by Other route 4 (four) times daily. Use as instructed. Up to four times per day   . HYDROcodone-acetaminophen (NORCO/VICODIN) 5-325 MG tablet Take 1 tablet by mouth every 6 (six) hours as needed for moderate pain or severe pain.   . Insulin Glargine (BASAGLAR KWIKPEN) 100 UNIT/ML SOPN Inject 0.3 mLs (30  Units total) into the skin daily.   . Insulin Pen Needle (PEN NEEDLES) 32G X 4 MM MISC 1 applicator by Does not apply route daily.   Marland Kitchen losartan (COZAAR) 100 MG tablet Take 1 tablet (100 mg total) by mouth daily.   . metFORMIN (GLUCOPHAGE) 500 MG tablet Take 2 tablets (1,000 mg total) by mouth 2 (two) times daily with a meal.   . rosuvastatin (CRESTOR) 20 MG tablet Take 1 tablet (20 mg total) by mouth daily. 05/29/2018: Takes in evening    No facility-administered encounter medications on file as of 05/30/2019.     Objective:   Goals Addressed            This Visit's Progress     Patient Stated   . I would like to optimize my diabetes management (pt-stated)       Current Barriers:  . Diabetes: I3JA; complicated by chronic medical conditions including HTN, HLD most recent A1c 8.1% on 10/13 . Current antihyperglycemic regimen: Basaglar 30 units, Jardiance '25mg'$ , glipizde, metformin o Instructions given for patient assistance process o Patient tolerating well . Denies hypoglycemic symptoms, Denies hyperglycemic symptoms . Current meal patterns: o Breakfast: eggs, bacon, 1 piece of toast o Lunch: not often o Supper: fried chicken/protein, greens/veggie, discuss light carb is ok o Snacks: veggies/protein o Drinks: coffee with light creamer (no sugar), discussed avoiding juices/sugary drinks/sodas . Current exercise: walking (recent knee surgery, but recovering) . Current blood glucose readings: 108-127 . Cardiovascular risk reduction: ASCVD risk score is >20%  - high intensity statin/ASA as  below o Current hypertensive regimen: amlodipine, chlorthalidone, losartan (BP goal <130/80 mmHg.) o Current hyperlipidemia regimen: rosuvastatin '20mg'$  daily o Current antiplatelet regimen: ASA 81  Pharmacist Clinical Goal(s):  Marland Kitchen Over the next 90 days, patient will work with PharmD and primary care provider to address needs related to optimized medication management of  diabetes  Interventions: . Comprehensive medication review performed, medication list updated in electronic medical record . Reviewed & discussed the following diabetes-related information with patient: o Reviewed ADA recommended "diabetes-friendly" diet  (reviewed healthy snack/food options) o Discussed insulin injection technique; Patient uses AccuCheck glucometer o Reviewed medication purpose/side effects-->patient denies adverse events  Patient Self Care Activities:  . Patient will check blood glucose daily (or if symptomatic), document, and provide at future appointments . Patient will focus on medication adherence  . Patient will take medications as prescribed . Patient will contact provider with any episodes of hypoglycemia . Patient will report any questions or concerns to provider   Please see past updates related to this goal by clicking on the "Past Updates" button in the selected goal         Plan:   The care management team will reach out to the patient again over the next 60 days.   Provider Signature Regina Eck, PharmD, BCPS Clinical Pharmacist, [place]: [phone]

## 2019-06-03 ENCOUNTER — Other Ambulatory Visit: Payer: Self-pay | Admitting: *Deleted

## 2019-06-03 MED ORDER — ROSUVASTATIN CALCIUM 20 MG PO TABS: 20.0000 mg | ORAL_TABLET | Freq: Every day | ORAL | 3 refills | Status: DC

## 2019-06-07 ENCOUNTER — Other Ambulatory Visit: Payer: Self-pay | Admitting: Family Medicine

## 2019-06-07 DIAGNOSIS — I10 Essential (primary) hypertension: Secondary | ICD-10-CM

## 2019-06-08 ENCOUNTER — Other Ambulatory Visit: Payer: Self-pay | Admitting: Family Medicine

## 2019-06-08 DIAGNOSIS — E119 Type 2 diabetes mellitus without complications: Secondary | ICD-10-CM

## 2019-06-11 ENCOUNTER — Other Ambulatory Visit: Payer: Self-pay | Admitting: Pharmacy Technician

## 2019-06-11 ENCOUNTER — Ambulatory Visit: Payer: Self-pay | Admitting: Pharmacist

## 2019-06-11 DIAGNOSIS — E119 Type 2 diabetes mellitus without complications: Secondary | ICD-10-CM

## 2019-06-11 NOTE — Progress Notes (Signed)
Chronic Care Management    Visit Note  06/11/2019 Name: Zacharie Portner MRN: [id number] DOB: 03/12/1951  Referred by: Shirley, [place], DO Reason for referral : No chief complaint on file.   [name] is a 69 y.o. year old male who is a primary care patient of Shirley, [place], DO. The CCM team was consulted for assistance with chronic disease management and care coordination needs related to DMII  Review of patient status, including review of consultants reports, relevant laboratory and other test results, and collaboration with appropriate care team members and the patient's provider was performed as part of comprehensive patient evaluation and provision of chronic care management services.    SDOH (Social Determinants of Health) screening performed today: Financial Strain . See Care Plan for related entries.   Medications: Outpatient Encounter Medications as of 06/11/2019  Medication Sig Note  . Accu-Chek Softclix Lancets lancets USE AS DIRECTED UP TO FOUR TIMES DAILY   . losartan (COZAAR) 100 MG tablet TAKE 1 TABLET(100 MG) BY MOUTH DAILY   . amLODipine (NORVASC) 10 MG tablet Take 1 tablet (10 mg total) by mouth at bedtime.   Marland Kitchen aspirin EC 81 MG tablet Take 81 mg by mouth daily.   . blood glucose meter kit and supplies Dispense based on patient and insurance preference. Use up to four times daily as directed. (FOR ICD-10 E10.9, E11.9).   . chlorthalidone (HYGROTON) 25 MG tablet TAKE 1/2 TABLET(12.5 MG) BY MOUTH DAILY   . empagliflozin (JARDIANCE) 25 MG TABS tablet Take 25 mg by mouth daily. 12/21/2018: Medication through patient assistance  . glipiZIDE (GLUCOTROL) 10 MG tablet TAKE 1 TABLET BY MOUTH DAILY BEFORE BREAKFAST   . glucose blood (ACCU-CHEK AVIVA) test strip 1 each by Other route 4 (four) times daily. Use as instructed. Up to four times per day   . HYDROcodone-acetaminophen (NORCO/VICODIN) 5-325 MG tablet Take 1 tablet by mouth every 6 (six) hours as needed for moderate pain or severe  pain.   . Insulin Glargine (BASAGLAR KWIKPEN) 100 UNIT/ML SOPN Inject 0.3 mLs (30 Units total) into the skin daily.   . Insulin Pen Needle (PEN NEEDLES) 32G X 4 MM MISC 1 applicator by Does not apply route daily.   . metFORMIN (GLUCOPHAGE) 500 MG tablet Take 2 tablets (1,000 mg total) by mouth 2 (two) times daily with a meal.   . rosuvastatin (CRESTOR) 20 MG tablet Take 1 tablet (20 mg total) by mouth daily.   . [DISCONTINUED] ACCU-CHEK SOFTCLIX LANCETS lancets Use as instructed. Up to four times per day    No facility-administered encounter medications on file as of 06/11/2019.     Objective:   Goals Addressed            This Visit's Progress     Patient Stated   . I would like to optimize my diabetes management (pt-stated)       Current Barriers:  . Diabetes: U2VO; complicated by chronic medical conditions including HTN, HLD most recent A1c 8.1% on 10/13 . Current antihyperglycemic regimen: Basaglar 30 units, Jardiance '25mg'$ , glipizde, metformin o Instructions given for patient assistance process o Patient tolerating well o 06/11/19-Patient approved for Basaglar-pt to call [facility] [phone] to set up shipment . Denies hypoglycemic symptoms, Denies hyperglycemic symptoms . Current meal patterns: o Breakfast: eggs, bacon, 1 piece of toast o Lunch: not often o Supper: fried chicken/protein, greens/veggie, discuss light carb is ok o Snacks: veggies/protein o Drinks: coffee with light creamer (no sugar), discussed avoiding juices/sugary drinks/sodas .  Current exercise: walking (recent knee surgery, but recovering) . Current blood glucose readings: 108-127 . Cardiovascular risk reduction: ASCVD risk score is >20%  - high intensity statin/ASA as below o Current hypertensive regimen: amlodipine, chlorthalidone, losartan (BP goal <130/80 mmHg.) o Current hyperlipidemia regimen: rosuvastatin '20mg'$  daily o Current antiplatelet regimen: ASA 81  Pharmacist Clinical Goal(s):   Marland Kitchen Over the next 90 days, patient will work with PharmD and primary care provider to address needs related to optimized medication management of diabetes  Interventions: . Comprehensive medication review performed, medication list updated in electronic medical record . Reviewed & discussed the following diabetes-related information with patient: o Reviewed ADA recommended "diabetes-friendly" diet  (reviewed healthy snack/food options) o Discussed insulin injection technique; Patient uses AccuCheck glucometer o Reviewed medication purpose/side effects-->patient denies adverse events  Patient Self Care Activities:  . Patient will check blood glucose daily (or if symptomatic), document, and provide at future appointments . Patient will focus on medication adherence  . Patient will take medications as prescribed . Patient will contact provider with any episodes of hypoglycemia . Patient will report any questions or concerns to provider   Please see past updates related to this goal by clicking on the "Past Updates" button in the selected goal            Plan:   The care management team will reach out to the patient again over the next 60 days.   Provider Signature Regina Eck, PharmD, BCPS Clinical Pharmacist, [place]: [phone]

## 2019-06-11 NOTE — Patient Outreach (Signed)
[facility]) Care Management  06/11/2019  [name] 1951-01-18 [id number]    Follow up call placed to [name] regarding patient assistance application(s) for [name] , [name] confirms patient has been approved as of 2/16 until 04/24/20.   Follow up:  Informed [facility] Embedded RPh of approval, will remove myself from care team.  Maud Deed. Chana Bode [place] Certified Pharmacy Technician [place] Management Direct Dial:[phone]

## 2019-06-18 ENCOUNTER — Telehealth: Payer: Medicare HMO

## 2019-06-20 ENCOUNTER — Telehealth: Payer: Self-pay | Admitting: *Deleted

## 2019-06-20 NOTE — Telephone Encounter (Signed)
-----   Message from [name], [place] sent at 06/20/2019 10:54 AM EST ----- Regarding: meds This patient's meds came in today. I'm assuming they belong to this [name], there are two in the system and no DOB was provided.

## 2019-06-20 NOTE — Telephone Encounter (Signed)
LM for patient that his medications have arrived and they are in the vaccine refrigerator.  Angline Schweigert,CMA

## 2019-08-25 ENCOUNTER — Other Ambulatory Visit: Payer: Self-pay | Admitting: Family Medicine

## 2019-08-25 DIAGNOSIS — E119 Type 2 diabetes mellitus without complications: Secondary | ICD-10-CM

## 2019-08-26 ENCOUNTER — Other Ambulatory Visit: Payer: Self-pay | Admitting: Family Medicine

## 2019-08-26 DIAGNOSIS — E119 Type 2 diabetes mellitus without complications: Secondary | ICD-10-CM

## 2019-09-23 ENCOUNTER — Other Ambulatory Visit: Payer: Self-pay | Admitting: Family Medicine

## 2019-09-23 DIAGNOSIS — I10 Essential (primary) hypertension: Secondary | ICD-10-CM

## 2019-10-09 ENCOUNTER — Telehealth: Payer: Self-pay | Admitting: *Deleted

## 2019-10-09 NOTE — Telephone Encounter (Signed)
LM for patient that his samples are ready for pick up.  Janine Reller,CMA

## 2019-10-09 NOTE — Telephone Encounter (Signed)
-----   Message from [name], [place] sent at 10/09/2019 10:25 AM EDT ----- Regarding: Patient meds Received Basaglar for this patient this morning. If someone will contact him to come pick it up.   Thanks!

## 2019-11-07 ENCOUNTER — Other Ambulatory Visit: Payer: Self-pay | Admitting: Family Medicine

## 2019-11-07 DIAGNOSIS — E119 Type 2 diabetes mellitus without complications: Secondary | ICD-10-CM

## 2020-02-07 ENCOUNTER — Telehealth: Payer: Self-pay | Admitting: *Deleted

## 2020-02-07 NOTE — Telephone Encounter (Signed)
LM for patient letting him know that his medications were received and he can come by and pick them up.  Hibo Blasdell,CMA

## 2020-02-07 NOTE — Telephone Encounter (Signed)
-----   Message from [name], [place] sent at 02/07/2020 10:18 AM EDT ----- Regarding: Patient meds This patient's basaglar arrived this morning, please notify him to pick it up.  Thanks!

## 2020-02-14 ENCOUNTER — Other Ambulatory Visit: Payer: Self-pay

## 2020-02-19 MED ORDER — GLIPIZIDE 10 MG PO TABS: ORAL_TABLET | ORAL | 0 refills | Status: DC

## 2020-03-09 ENCOUNTER — Other Ambulatory Visit: Payer: Self-pay

## 2020-03-09 DIAGNOSIS — I10 Essential (primary) hypertension: Secondary | ICD-10-CM

## 2020-03-10 MED ORDER — CHLORTHALIDONE 25 MG PO TABS: ORAL_TABLET | ORAL | 2 refills | Status: DC

## 2020-03-23 ENCOUNTER — Other Ambulatory Visit: Payer: Self-pay | Admitting: Family Medicine

## 2020-04-08 ENCOUNTER — Telehealth: Payer: Self-pay | Admitting: Family Medicine

## 2020-04-08 NOTE — Telephone Encounter (Signed)
Medication Form form dropped off for at front desk for completion.  Verified that patient section of form has been completed.  Last DOS/WCC with PCP was 02/05/19.  Placed form in team folder to be completed by clinical staff.  Creig Hines

## 2020-04-09 NOTE — Telephone Encounter (Signed)
Clinical info completed on  Medication  form.  Place form in PCP's box for completion.  Salvatore Marvel, CMA

## 2020-04-12 ENCOUNTER — Emergency Department (HOSPITAL_COMMUNITY): Payer: Medicare HMO

## 2020-04-12 ENCOUNTER — Emergency Department (HOSPITAL_COMMUNITY)
Admission: EM | Admit: 2020-04-12 | Discharge: 2020-04-12 | Disposition: A | Discharge status: Home / Self Care | Payer: Medicare HMO | Attending: Emergency Medicine | Admitting: Emergency Medicine

## 2020-04-12 DIAGNOSIS — Z79899 Other long term (current) drug therapy: Secondary | ICD-10-CM | POA: Insufficient documentation

## 2020-04-12 DIAGNOSIS — E119 Type 2 diabetes mellitus without complications: Secondary | ICD-10-CM | POA: Insufficient documentation

## 2020-04-12 DIAGNOSIS — Z794 Long term (current) use of insulin: Secondary | ICD-10-CM | POA: Insufficient documentation

## 2020-04-12 DIAGNOSIS — R112 Nausea with vomiting, unspecified: Secondary | ICD-10-CM | POA: Diagnosis not present

## 2020-04-12 DIAGNOSIS — Z20822 Contact with and (suspected) exposure to covid-19: Secondary | ICD-10-CM | POA: Diagnosis not present

## 2020-04-12 DIAGNOSIS — Z7982 Long term (current) use of aspirin: Secondary | ICD-10-CM | POA: Diagnosis not present

## 2020-04-12 DIAGNOSIS — I252 Old myocardial infarction: Secondary | ICD-10-CM | POA: Insufficient documentation

## 2020-04-12 DIAGNOSIS — R1084 Generalized abdominal pain: Secondary | ICD-10-CM | POA: Diagnosis not present

## 2020-04-12 DIAGNOSIS — I10 Essential (primary) hypertension: Secondary | ICD-10-CM | POA: Diagnosis not present

## 2020-04-12 DIAGNOSIS — Z95828 Presence of other vascular implants and grafts: Secondary | ICD-10-CM | POA: Diagnosis not present

## 2020-04-12 DIAGNOSIS — Z87891 Personal history of nicotine dependence: Secondary | ICD-10-CM | POA: Diagnosis not present

## 2020-04-12 DIAGNOSIS — R1110 Vomiting, unspecified: Secondary | ICD-10-CM | POA: Diagnosis not present

## 2020-04-12 DIAGNOSIS — R109 Unspecified abdominal pain: Secondary | ICD-10-CM | POA: Diagnosis not present

## 2020-04-12 DIAGNOSIS — K5900 Constipation, unspecified: Secondary | ICD-10-CM | POA: Insufficient documentation

## 2020-04-12 LAB — LIPASE, BLOOD: Lipase: 43 U/L (ref 11–51)

## 2020-04-12 LAB — COMPREHENSIVE METABOLIC PANEL
ALT: 24 U/L (ref 0–44)
AST: 24 U/L (ref 15–41)
Albumin: 3.6 g/dL (ref 3.5–5.0)
Alkaline Phosphatase: 49 U/L (ref 38–126)
Anion gap: 12 (ref 5–15)
BUN: 15 mg/dL (ref 8–23)
CO2: 25 mmol/L (ref 22–32)
Calcium: 9.5 mg/dL (ref 8.9–10.3)
Chloride: 102 mmol/L (ref 98–111)
Creatinine, Ser: 1.06 mg/dL (ref 0.61–1.24)
GFR, Estimated: >60 mL/min (ref >60)
Glucose, Bld: 153 mg/dL — ABNORMAL HIGH (ref 70–99)
Potassium: 3.9 mmol/L (ref 3.5–5.1)
Sodium: 139 mmol/L (ref 135–145)
Total Bilirubin: 0.8 mg/dL (ref 0.3–1.2)
Total Protein: 7.8 g/dL (ref 6.5–8.1)

## 2020-04-12 LAB — URINALYSIS, ROUTINE W REFLEX MICROSCOPIC
Bacteria, UA: NONE SEEN
Bilirubin Urine: NEGATIVE
Glucose, UA: >=500 mg/dL — AB
Hgb urine dipstick: NEGATIVE
Ketones, ur: NEGATIVE mg/dL
Leukocytes,Ua: NEGATIVE
Nitrite: NEGATIVE
Protein, ur: 100 mg/dL — AB
Specific Gravity, Urine: 1.02 (ref 1.005–1.030)
pH: 5 (ref 5.0–8.0)

## 2020-04-12 LAB — CBC
HCT: 43.4 % (ref 39.0–52.0)
Hemoglobin: 13.2 g/dL (ref 13.0–17.0)
MCH: 28.5 pg (ref 26.0–34.0)
MCHC: 30.4 g/dL (ref 30.0–36.0)
MCV: 93.7 fL (ref 80.0–100.0)
Platelets: 318 10*3/uL (ref 150–400)
RBC: 4.63 MIL/uL (ref 4.22–5.81)
RDW: 13.1 % (ref 11.5–15.5)
WBC: 8.1 10*3/uL (ref 4.0–10.5)
nRBC: 0 % (ref 0.0–0.2)

## 2020-04-12 LAB — RESP PANEL BY RT-PCR (FLU A&B, COVID) ARPGX2
Influenza A by PCR: NEGATIVE
Influenza B by PCR: NEGATIVE
SARS Coronavirus 2 by RT PCR: NEGATIVE

## 2020-04-12 MED ORDER — MORPHINE SULFATE (PF) 4 MG/ML IV SOLN
4.0000 mg | Freq: Once | INTRAVENOUS | Status: AC
  Administered 2020-04-12: 4 mg via INTRAVENOUS
  Filled 2020-04-12: qty 1

## 2020-04-12 MED ORDER — ONDANSETRON HCL 4 MG/2ML IJ SOLN
4.0000 mg | Freq: Once | INTRAMUSCULAR | Status: AC
  Administered 2020-04-12: 4 mg via INTRAVENOUS
  Filled 2020-04-12: qty 2

## 2020-04-12 MED ORDER — IOHEXOL 300 MG/ML  SOLN
100.0000 mL | Freq: Once | INTRAMUSCULAR | Status: AC | PRN
  Administered 2020-04-12: 100 mL via INTRAVENOUS

## 2020-04-12 NOTE — ED Provider Notes (Signed)
[facility] EMERGENCY DEPARTMENT Provider Note   CSN: [id number] Arrival date & time: 04/12/20  6314     History Chief Complaint  Patient presents with  . Abdominal Pain  . Constipation    [name] is a 69 y.o. male with PMH/o DM, HTN, MI who presents for evaluation of abdominal pain x 4 days. He reports that he feels like "his stomach is out of whack."  He states that when he eats, he feels like his stomach has a lot of gas and will get all "twisted."  He states that he denies any nausea/vomiting.  He does not feel like the pain gets worsened after eating.  He states he has been constipated for the last few days.  He thinks his last bowel movement was about 3 days ago. He has still been passing gas. Patient states that he has not had any difficulty urinating, pain when he urinates.  He also is concerned that he might have Covid.  He states that about 3 weeks ago, he had about a 2-day period where he lost his taste and smell.  He states that he did not have any other symptoms and states that it resolved and that he has been fine since then.  He has not been vaccinated.  He denies any chest pain, difficulty breathing, cough, nasal congestion, rhinorrhea  The history is provided by the patient.       Past Medical History:  Diagnosis Date  . Diabetes mellitus without complication ([place])   . Hypercholesterolemia   . Hypertension   . MI (myocardial infarction) [facility])     Patient Active Problem List   Diagnosis Date Noted  . Low-level of literacy 05/21/2018  . Globus sensation 03/12/2018  . Type 2 diabetes mellitus without complication, without long-term current use of insulin ([place]) 06/09/2017  . Pure hypercholesterolemia 06/09/2017  . H/O heart artery stent 06/09/2017  . Essential hypertension, benign 06/09/2017    Past Surgical History:  Procedure Laterality Date  . CORONARY ANGIOPLASTY WITH STENT PLACEMENT     2004/2005 per patient       Family History   Problem Relation Age of Onset  . Heart disease Mother   . Diabetes Mother   . Hypertension Mother   . Alcoholism Father   . Heart disease Father   . Hypertension Sister   . Diabetes Brother   . Hypercholesterolemia Brother   . Osteoporosis Brother   . Hypertension Brother   . Kidney disease Brother     Social History   Tobacco Use  . Smoking status: Former Smoker    Packs/day: 0.50    Years: 10.00    Pack years: 5.00    Types: Cigarettes    Quit date: 04/25/1993    Years since quitting: 26.9  . Smokeless tobacco: Never Used  Substance Use Topics  . Alcohol use: No  . Drug use: No    Home Medications Prior to Admission medications   Medication Sig Start Date End Date Taking? Authorizing Provider  Accu-Chek Softclix Lancets lancets USE AS DIRECTED UP TO FOUR TIMES DAILY 06/10/19   Shirley, [place], DO  losartan (COZAAR) 100 MG tablet TAKE 1 TABLET(100 MG) BY MOUTH DAILY 06/07/19   Enid Derry, [place], DO  ACCU-CHEK AVIVA PLUS test strip USE UP TO FOUR TIMES DAILY AS DIRECTED 08/26/19   Shirley, [place], DO  amLODipine (NORVASC) 10 MG tablet Take 1 tablet (10 mg total) by mouth at bedtime. 05/15/19   Shirley, [place], DO  aspirin EC 81 MG tablet Take 81 mg by mouth daily.    [provider]  BD PEN NEEDLE NANO 2ND GEN 32G X 4 MM MISC USE AS DIRECTED DAILY 08/26/19   Shirley, [place], DO  blood glucose meter kit and supplies Dispense based on patient and insurance preference. Use up to four times daily as directed. (FOR ICD-10 E10.9, E11.9). 05/24/18   Zenia Resides, MD  chlorthalidone (HYGROTON) 25 MG tablet TAKE 1/2 TABLET(12.5 MG) BY MOUTH DAILY 03/10/20   Lilland, Alana, DO  empagliflozin (JARDIANCE) 25 MG TABS tablet Take 25 mg by mouth daily. 05/25/18   Zenia Resides, MD  glipiZIDE (GLUCOTROL) 10 MG tablet TAKE 1 TABLET BY MOUTH DAILY BEFORE BREAKFAST 03/23/20   Lilland, Alana, DO  HYDROcodone-acetaminophen (NORCO/VICODIN) 5-325 MG tablet Take 1 tablet by mouth every 6  (six) hours as needed for moderate pain or severe pain. 02/07/19   Domenic Moras, PA-C  Insulin Glargine (BASAGLAR KWIKPEN) 100 UNIT/ML SOPN Inject 0.3 mLs (30 Units total) into the skin daily. 11/15/18   Zenia Resides, MD  metFORMIN (GLUCOPHAGE) 500 MG tablet TAKE 2 TABLETS(1000 MG) BY MOUTH TWICE DAILY WITH A MEAL 11/07/19   Lilland, Alana, DO  rosuvastatin (CRESTOR) 20 MG tablet Take 1 tablet (20 mg total) by mouth daily. 06/03/19   Shirley, [place], DO  ACCU-CHEK SOFTCLIX LANCETS lancets Use as instructed. Up to four times per day 05/24/18   Zenia Resides, MD  chlorthalidone (HYGROTON) 25 MG tablet TAKE 1/2 TABLET(12.5 MG) BY MOUTH DAILY 05/15/19   Shirley, [place], DO    Allergies    Patient has no known allergies.  Review of Systems   Review of Systems  Constitutional: Negative for fever.  Respiratory: Negative for cough and shortness of breath.   Cardiovascular: Negative for chest pain.  Gastrointestinal: Positive for abdominal pain and constipation. Negative for nausea and vomiting.  Genitourinary: Negative for dysuria and hematuria.  Neurological: Negative for headaches.  All other systems reviewed and are negative.   Physical Exam Updated Vital Signs BP (!) 162/84   Pulse 79   Temp 98.3 F (36.8 C) (Oral)   Resp 18   SpO2 97%   Physical Exam Vitals and nursing note reviewed.  Constitutional:      Appearance: Normal appearance. He is well-developed and well-nourished.  HENT:     Head: Normocephalic and atraumatic.     Mouth/Throat:     Mouth: Oropharynx is clear and moist and mucous membranes are normal.  Eyes:     General: Lids are normal.     Extraocular Movements: EOM normal.     Conjunctiva/sclera: Conjunctivae normal.     Pupils: Pupils are equal, round, and reactive to light.  Cardiovascular:     Rate and Rhythm: Normal rate and regular rhythm.     Pulses: Normal pulses.     Heart sounds: Normal heart sounds. No murmur heard. No friction rub. No gallop.    Pulmonary:     Effort: Pulmonary effort is normal.     Breath sounds: Normal breath sounds.     Comments: Lungs clear to auscultation bilaterally.  Symmetric chest rise.  No wheezing, rales, rhonchi. Abdominal:     Palpations: Abdomen is soft. Abdomen is not rigid.     Tenderness: There is generalized abdominal tenderness. There is no guarding.     Comments: Abdomen soft, nondistended.  Generalized tenderness no focal point.  No rigidity, guarding.  No CVA tenderness bilaterally.  Musculoskeletal:  General: Normal range of motion.     Cervical back: Full passive range of motion without pain.  Skin:    General: Skin is warm and dry.     Capillary Refill: Capillary refill takes less than 2 seconds.  Neurological:     Mental Status: He is alert and oriented to person, place, and time.  Psychiatric:        Mood and Affect: Mood and affect normal.        Speech: Speech normal.     ED Results / Procedures / Treatments   Labs (all labs ordered are listed, but only abnormal results are displayed) Labs Reviewed  COMPREHENSIVE METABOLIC PANEL - Abnormal; Notable for the following components:      Result Value   Glucose, Bld 153 (*)    All other components within normal limits  URINALYSIS, ROUTINE W REFLEX MICROSCOPIC - Abnormal; Notable for the following components:   Glucose, UA >=500 (*)    Protein, ur 100 (*)    All other components within normal limits  RESP PANEL BY RT-PCR (FLU A&B, COVID) ARPGX2  LIPASE, BLOOD  CBC    EKG None  Radiology CT ABDOMEN PELVIS W CONTRAST  Result Date: 04/12/2020 CLINICAL DATA:  Abdominal pain, nausea and vomiting. EXAM: CT ABDOMEN AND PELVIS WITH CONTRAST TECHNIQUE: Multidetector CT imaging of the abdomen and pelvis was performed using the standard protocol following bolus administration of intravenous contrast. CONTRAST:  168m OMNIPAQUE IOHEXOL 300 MG/ML  SOLN COMPARISON:  CT abdomen pelvis 04/21/2017 FINDINGS: Lower chest: Normal heart  size. Sharply marginated patchy areas of consolidation are demonstrated within the lower lungs bilaterally. No pleural effusion or pneumothorax. Hepatobiliary: Liver is normal in size and contour. Gallbladder is unremarkable. No intrahepatic or extrahepatic biliary ductal dilatation. Pancreas: Unremarkable Spleen: Unremarkable Adrenals/Urinary Tract: Stable 13 mm left adrenal gland nodule most compatible with adenoma (image 28; series 3). Normal right adrenal gland. Kidneys enhance symmetrically with contrast. No hydronephrosis. Urinary bladder is unremarkable. Stomach/Bowel: No abnormal bowel wall thickening or evidence for bowel obstruction. Normal morphology of the stomach. No free fluid or free intraperitoneal air. Vascular/Lymphatic: Normal caliber abdominal aorta. Peripheral calcified atherosclerotic plaque. No retroperitoneal lymphadenopathy. Reproductive: Heterogeneous prostate. Other: Small fat containing right inguinal hernia. Within the upper abdominal fat scratch the within the upper abdominal pre aortic fat adjacent to the celiac axis there is a small amount of fat stranding which is nonspecific in etiology (image 28; series 3). Musculoskeletal: Lower thoracic and lumbar spine degenerative changes. No aggressive or acute appearing osseous lesions. IMPRESSION: 1. Sharply marginated patchy areas of consolidation within the lower lungs bilaterally which may represent multifocal infection, potentially secondary to COVID-19. 2. Small amount of nonspecific fat stranding within the upper abdomen adjacent to the celiac axis. This may be secondary to an infectious process. Given the appearance, recommend follow-up abdominal CT in 2-4 weeks to assess for interval change. Consider clinical and laboratory correlation. Electronically Signed   By: DLovey NewcomerM.D.   On: 04/12/2020 12:56    Procedures Procedures (including critical care time)  Medications Ordered in ED Medications  iohexol (OMNIPAQUE) 300  MG/ML solution 100 mL (100 mLs Intravenous Contrast Given 04/12/20 1207)  morphine 4 MG/ML injection 4 mg (4 mg Intravenous Given 04/12/20 1238)  ondansetron (ZOFRAN) injection 4 mg (4 mg Intravenous Given 04/12/20 1237)    ED Course  I have reviewed the triage vital signs and the nursing notes.  Pertinent labs & imaging results that were available during my  care of the patient were reviewed by me and considered in my medical decision making (see chart for details).    MDM Rules/Calculators/A&P                          69 year old male who presents for evaluation of generalized abdominal pain x5 days.  He reports some constipation as well.  He is also concerned about possible Covid because about 3 weeks ago, he had an episode where he could not taste or smell his food.  Currently denies any other symptoms.  No chest pain, difficulty breathing.  Initial arrival, he is afebrile, nontoxic-appearing.  Vital signs are stable.  On exam, he is some mild tenderness noted to the generalized abdomen no focal point.  No rigidity, guarding.  Concern for intra-abdominal infectious process versus constipation.  Low suspicion for small bowel obstruction given that he still passing gas.  Plan to check labs, CT scan.  We will also obtain COVID-19 test.  Covid and flu are negative.  CMP shows normal BUN and creatinine.  UA shows no evidence of infectious etiology.  He does have glucose noted in his urine.  Lipase is normal.  CBC shows no leukocytosis or anemia.  CT ab pelvis shows sharply marginated patchy areas of consolidation within the lower lobe lungs bilaterally which could represent COVID-19.  He also has a small amount of nonspecific fat stranding within the upper abdomen adjacent to the celiac axis.  This could be secondary to infectious process.  Recommend CT follow-up in 2 to 4 weeks to see if it has resolved.  I discussed with Dr. Rosana Hoes.  He said that the mesenteric arteries were patent and that there  was no other secondary finding of infectious etiology.  Reevaluation.  Patient resting comfortably in bed.  Repeat abdominal exam is improved.  Patient states he is ready to go home.  I did discuss with him regarding his CT findings and that he should follow-up with his primary care doctor.  I also discussed with patient that he could have had a COVID-19 infection given his findings on CT scan.  At this time, he has no other symptoms.  Stable for discharge. At this time, patient exhibits no emergent life-threatening condition that require further evaluation in ED. Patient had ample opportunity for questions and discussion. All patient's questions were answered with full understanding. Strict return precautions discussed. Patient expresses understanding and agreement to plan.   Portions of this note were generated with [employer]. Dictation errors may occur despite best attempts at proofreading.   Final Clinical Impression(s) / ED Diagnoses Final diagnoses:  Generalized abdominal pain    Rx / DC Orders ED Discharge Orders    None       Volanda Napoleon, PA-C 04/12/20 1546    Ezequiel Essex, MD 04/13/20 [phone]

## 2020-04-12 NOTE — ED Notes (Signed)
Ambulated to restroom  

## 2020-04-12 NOTE — ED Notes (Signed)
Discharge instructions discussed with pt. Pt verbalized understanding. Pt stable and ambulatory. No signature pad available. 

## 2020-04-12 NOTE — Discharge Instructions (Signed)
Your work-up today was reassuring.  Your Covid and flu test were negative.  As we discussed, your CT scan finding showed some nonspecific fat stranding which could be reactive from an infectious process.  There were no other signs of infection.  There is nothing to do about this now.  Please consult your primary care doctor or the [facility] to obtain a repeat CT in about 2 to 4 weeks to ensure that this has gotten better.  Additionally, discussed, there were some findings on your lungs on the CT scan that could have indicate a recent COVID-19 infection.  It is possible that when you had the other symptoms, you had COVID-19.  Return the emergency department for any abdominal pain, vomiting, fever, difficulty breathing or any other worsening concerning symptoms.

## 2020-04-12 NOTE — ED Notes (Signed)
Pt transported to CT ?

## 2020-04-12 NOTE — ED Triage Notes (Signed)
Pt endorses abd pain without n/v/d. Endorses mild cough, denies fever or chills. States that he lost taste and smell a few days ago but it has returned. Denies any recent sick contacts. LBM was a few weeks ago.

## 2020-04-14 ENCOUNTER — Telehealth: Payer: Self-pay

## 2020-04-14 NOTE — Telephone Encounter (Signed)
Faxed completed application to [employer] for patient assistance for [employer] today.

## 2020-04-21 NOTE — Telephone Encounter (Signed)
Packet was completed on 12/20 with assistance from pharmacy. Pharmacy then placed it to be faxed over to the appropriate number, confirmed with clinical pharmacist on 12/27. Marilyn Wing, DO

## 2020-04-27 ENCOUNTER — Other Ambulatory Visit: Payer: Self-pay | Admitting: Family Medicine

## 2020-05-01 ENCOUNTER — Telehealth: Payer: Self-pay | Admitting: Pharmacist

## 2020-05-01 NOTE — Telephone Encounter (Signed)
Received message that patient's wife tried to contact pharmacy team. Attempted to contact patient and left HIPAA compliant voicemail to call clinic back at earliest convenience.

## 2020-05-05 ENCOUNTER — Telehealth: Payer: Self-pay

## 2020-05-05 NOTE — Telephone Encounter (Signed)
Spoke with pts wife. She stated that husband knows that insulin is ready for pick. She said that he was not home at the time and that he has probably picked up the medication. Salvatore Marvel, CMA

## 2020-05-13 ENCOUNTER — Other Ambulatory Visit: Payer: Self-pay

## 2020-05-13 ENCOUNTER — Ambulatory Visit (INDEPENDENT_AMBULATORY_CARE_PROVIDER_SITE_OTHER): Payer: Medicare HMO | Admitting: Family Medicine

## 2020-05-13 ENCOUNTER — Encounter: Payer: Self-pay | Admitting: Family Medicine

## 2020-05-13 VITALS — BP 142/70 | HR 108 | Wt 198.0 lb

## 2020-05-13 DIAGNOSIS — E7800 Pure hypercholesterolemia, unspecified: Secondary | ICD-10-CM | POA: Diagnosis not present

## 2020-05-13 DIAGNOSIS — E119 Type 2 diabetes mellitus without complications: Secondary | ICD-10-CM | POA: Diagnosis not present

## 2020-05-13 DIAGNOSIS — Z23 Encounter for immunization: Secondary | ICD-10-CM | POA: Diagnosis not present

## 2020-05-13 LAB — POCT GLYCOSYLATED HEMOGLOBIN (HGB A1C): Hemoglobin A1C: 8.2 % — AB (ref 4.0–5.6)

## 2020-05-13 MED ORDER — BLOOD GLUCOSE METER KIT: PACK | 0 refills | Status: AC

## 2020-05-13 NOTE — Progress Notes (Signed)
    SUBJECTIVE:   CHIEF COMPLAINT / HPI:   T2DM Patient brought in his application form for financial assistance for his Jardiance. Patient reports that his fasting sugars at home have been running around anywhere from 140-190.  He states he checks his sugars around 11 AM.  He does report that he thinks his glucose meter is incorrect as he has to check it several times and gets different readings.  He reports that the first reading will sometimes be in the high 200s, and 1 minute later the next reading will be in the 100s.  Patient reports no symptoms of neuropathy.  He has not had an eye exam this year.   Current medications: Jardiance 25 mg daily, glipizide 10 mg daily, metformin 1000 mg daily, Basaglar 30 units daily. A1c today: 8.2   PERTINENT  PMH / PSH: Type 2 diabetes, hypercholesterolemia, history of heart artery stent  OBJECTIVE:   BP (!) 142/70   Pulse (!) 108   Wt 198 lb (89.8 kg)   SpO2 98%   BMI 32.95 kg/m   Gen: well-appearing, NAD CV: RRR, no m/r/g appreciated, no peripheral edema Pulm: CTAB, no wheezes/crackles GI: soft, non-tender, non-distended  ASSESSMENT/PLAN:   Type 2 diabetes mellitus without complication, without long-term current use of insulin (HCC) A1c today 8.2.  Current medications include Jardiance, glipizide, metformin, Basaglar. -Financial assistance form completed, to be faxed -Applying for Trulicity through the [employer] foundation -Continue current medications until Trulicity is approved and comes in, at that time we will switch glipizide for Trulicity. -Continue taking fasting blood sugars in the morning -Follow-up in 3 months for repeat A1c and monitoring if able to get Trulicity.   Healthcare maintenance -Patient received flu vaccine today -Patient received PNA vaccine today -Checking lipid panel today as patient is only on Rosuvastatin 20mg     [name], [place]

## 2020-05-13 NOTE — Patient Instructions (Signed)
It was so great to see you! Your A1c today was 8.2, our goal is around 7 so we have a few adjustments we can make to your meds to do this. We are going to fill out the form for your medications and fax that over for you. We are also adding in a prescription for Trulicity that will be through the [facility]. For now, continue to take the glipizide, once the Trulicity comes in we will replace the glipizide with this medication.

## 2020-05-13 NOTE — Assessment & Plan Note (Signed)
A1c today 8.2.  Current medications include Jardiance, glipizide, metformin, Basaglar. -Financial assistance form completed, to be faxed -Applying for Trulicity through the [employer] foundation -Continue current medications until Trulicity is approved and comes in, at that time we will switch glipizide for Trulicity. -Continue taking fasting blood sugars in the morning -Follow-up in 3 months for repeat A1c and monitoring if able to get Trulicity.

## 2020-05-14 LAB — LIPID PANEL
Chol/HDL Ratio: 3.1 ratio (ref 0.0–5.0)
Cholesterol, Total: 176 mg/dL (ref 100–199)
HDL: 56 mg/dL (ref >39)
LDL Chol Calc (NIH): 99 mg/dL (ref 0–99)
Triglycerides: 121 mg/dL (ref 0–149)
VLDL Cholesterol Cal: 21 mg/dL (ref 5–40)

## 2020-05-17 ENCOUNTER — Telehealth: Payer: Self-pay | Admitting: Family Medicine

## 2020-05-17 ENCOUNTER — Other Ambulatory Visit: Payer: Self-pay | Admitting: Family Medicine

## 2020-05-17 DIAGNOSIS — E7800 Pure hypercholesterolemia, unspecified: Secondary | ICD-10-CM

## 2020-05-17 MED ORDER — ROSUVASTATIN CALCIUM 40 MG PO TABS: 40.0000 mg | ORAL_TABLET | Freq: Every day | ORAL | 3 refills | Status: DC

## 2020-05-17 NOTE — Telephone Encounter (Signed)
Patient called with cholesterol panel results. LDL is 99, which is above the goal of 70 for diabetic patients. Will increase Rosuvastatin to 40mg  daily, patient notified and informed he can take 2 20mg  tablets until his prescription is out. Will send in a new prescription for the 40mg  as well.  Shaunita Seney, DO

## 2020-05-21 ENCOUNTER — Other Ambulatory Visit: Payer: Self-pay | Admitting: Family Medicine

## 2020-05-21 MED ORDER — ACCU-CHEK GUIDE ME W/DEVICE KIT: 1.0000 | PACK | Freq: Once | 0 refills | Status: AC

## 2020-05-21 MED ORDER — ACCU-CHEK GUIDE VI STRP: ORAL_STRIP | 12 refills | Status: DC

## 2020-05-21 NOTE — Progress Notes (Signed)
Sent both  Accu-chek Guide Me - Device/kit  Accu-chek Guide Strips QS for daily testing  To patient pharmacy

## 2020-05-27 NOTE — Progress Notes (Signed)
[name] is currently enrolled with [place] for patient assistance on medications basaglar and trulicity. These meds are shipped to the patients home, and will expire 03/2021

## 2020-06-02 ENCOUNTER — Other Ambulatory Visit: Payer: Self-pay

## 2020-06-02 DIAGNOSIS — I10 Essential (primary) hypertension: Secondary | ICD-10-CM

## 2020-06-02 MED ORDER — AMLODIPINE BESYLATE 10 MG PO TABS: 10.0000 mg | ORAL_TABLET | Freq: Every day | ORAL | 3 refills | Status: DC

## 2020-06-03 ENCOUNTER — Other Ambulatory Visit: Payer: Self-pay

## 2020-06-03 ENCOUNTER — Ambulatory Visit (INDEPENDENT_AMBULATORY_CARE_PROVIDER_SITE_OTHER): Payer: Medicare HMO | Admitting: Pharmacist

## 2020-06-03 VITALS — BP 140/78 | HR 86 | Wt 201.6 lb

## 2020-06-03 DIAGNOSIS — E119 Type 2 diabetes mellitus without complications: Secondary | ICD-10-CM

## 2020-06-03 NOTE — Progress Notes (Addendum)
S:     Chief Complaint  Patient presents with  . Diabetes    Patient arrives in good spirits.  Presents for diabetes evaluation, education, and management. Approved through [employer] P[employer] for Trulicity. Patient was referred and last seen by Primary Care Provider on 05/13/20.  Patient reports Diabetes was diagnosed about 15 years ago.  Insurance coverage/medication affordability: Medicare  Medication adherence reported with no issues. Current diabetes medications include: empagliflozin (Jardiance) 25mg , insulin glargine (Basaglar) 30 units once daily, metformin 500mg  2 tablets BID Current hypertension medications include: amlodipine 10mg , losartan 100mg , chlorthalidone 12mg  Current hyperlipidemia medications include: rosuvastatin 40mg   Patient denies hypoglycemic events.  Patient reported dietary habits: Eats 2-3 meals/day Breakfast: eggs, whole wheat toast Lunch: rarely; if he does eat usually 1/2 of pb&j or soup  Dinner: soup; hamburger; vegetables every night Snacks: applesauce once in awhile Drinks: mainly water, orange juice (rarely), pepsi (rarely)  Patient-reported exercise habits: before COVID went to gym every day but now walks around block daily with dog for 25-30 minutes   Patient denies nocturia (nighttime urination).  Patient denies neuropathy (nerve pain). Patient denies visual changes. Patient reports self foot exams.     O:  Physical Exam   ROS   Lab Results  Component Value Date   HGBA1C 8.2 (A) 05/13/2020   Vitals:   06/03/20 1408  BP: 140/78  Pulse: 86  SpO2: 97%    Lipid Panel     Component Value Date/Time   CHOL 176 05/13/2020 1522   TRIG 121 05/13/2020 1522   HDL 56 05/13/2020 1522   CHOLHDL 3.1 05/13/2020 1522   LDLCALC 99 05/13/2020 1522   Home fasting blood sugars: no readings to report as he does not check 2 hour post-breakfast: 140's  Clinical Atherosclerotic Cardiovascular Disease (ASCVD): Yes  The 10-year ASCVD risk  score Mikey Bussing DC Jr., et al., 2013) is: 34.8%   Values used to calculate the score:     Age: 70 years     Sex: Male     Is Non-Hispanic African American: Yes     Diabetic: Yes     Tobacco smoker: No     Systolic Blood Pressure: 101 mmHg     Is BP treated: Yes     HDL Cholesterol: 56 mg/dL     Total Cholesterol: 176 mg/dL    A/P: Diabetes longstanding currently uncontrolled per last A1C, however, self-reported post-prandial readings demonstrate improved control. Patient is able to verbalize appropriate hypoglycemia management plan. Medication adherence appears optimal. When beginning to counsel patient on Trulicity he expressed that he was previously on this medication and was taken off due to pancreatitis. Initiation of GLP-1 no longer appropriate. Will prioritize options for SGLT-2 as patient was denied Jardiance through [facility] 2022.  -Discussed with Dr. Oleh Genin and recommended for patient to discontinue glipizide -Continue all other diabetes medications -Extensively discussed pathophysiology of diabetes, recommended lifestyle interventions, dietary effects on blood sugar control -Recommended patient check blood glucose when fasting and then a few times a week after his largest meal -Counseled on s/sx of and management of hypoglycemia -Next A1C anticipated April 2022.   ASCVD risk - primary prevention in patient with diabetes. Last LDL is not controlled and rosuvastatin was increased at that time. ASCVD risk score is >20%  - high intensity statin indicated. Aspirin is indicated.  -Continued aspirin 81 mg  -Continued rosuvastatin 40 mg.   Hypertension longstanding currently uncontrolled.  Blood pressure goal <130/80 mmHg. Medication adherence appears optimal.  Blood pressure control is suboptimal due to further management of medications. -Recommend Dr. Oleh Genin to consider titration of chlorthalidone from 12.5mg  to 25mg  for further reduction in systolic readings.   Written patient  instructions provided.  Total time in face to face counseling 45 minutes.   Follow up [facility] via telephone in one week..  Patient seen with Jeanmarie Hubert, PharmD Candidate [phone]

## 2020-06-03 NOTE — Patient Instructions (Signed)
[name] it was a pleasure seeing you today.   Please do the following:  1. STOP glipizide as directed today during your appointment. If you have any questions or if you believe something has occurred because of this change, call me or your doctor to let one of [place] know.  2. Continue checking blood sugars at home in the morning. It's really important that you record these and bring these in to your next doctor's appointment.  3. Continue making the lifestyle changes we've discussed together during our visit. Diet and exercise play a significant role in improving your blood sugars.  4. Follow-up with me via phone call in one week.   Hypoglycemia or low blood sugar:   Low blood sugar can happen quickly and may become an emergency if not treated right away.   While this shouldn't happen often, it can be brought upon if you skip a meal or do not eat enough. Also, if your insulin or other diabetes medications are dosed too high, this can cause your blood sugar to go to low.   Warning signs of low blood sugar include: 1. Feeling shaky or dizzy 2. Feeling weak or tired  3. Excessive hunger 4. Feeling anxious or upset  5. Sweating even when you aren't exercising  What to do if I experience low blood sugar? Follow the Rule of 15 1. Check your blood sugar with your meter. If lower than 70, proceed to step 2.  2. Treat with 15 grams of fast acting carbs which is found in 3-4 glucose tablets. If none are available you can try hard candy, 1 tablespoon of sugar or honey,4 ounces of fruit juice, or 6 ounces of REGULAR soda.  3. Re-check your sugar in 15 minutes. If it is still below 70, do what you did in step 2 again. If your blood sugar has come back up, go ahead and eat a snack or small meal made up of complex carbs (ex. Whole grains) and protein at this time to avoid recurrence of low blood sugar.

## 2020-06-03 NOTE — Progress Notes (Addendum)
Received notification from [employer]) regarding patient assistance DENIAL for [employer]. Income exceeds programs limits.  KTCCE:[phone] FOR APPLICATION QUESTIONS

## 2020-06-05 ENCOUNTER — Telehealth: Payer: Self-pay | Admitting: Pharmacist

## 2020-06-05 DIAGNOSIS — E119 Type 2 diabetes mellitus without complications: Secondary | ICD-10-CM

## 2020-06-05 MED ORDER — EMPAGLIFLOZIN 25 MG PO TABS: 25.0000 mg | ORAL_TABLET | Freq: Every day | ORAL | 5 refills | Status: DC

## 2020-06-05 NOTE — Telephone Encounter (Signed)
Patient in need of new prescription for co-pay assistance.  New Rx sent to his pharmacy for Jardiance 25mg .

## 2020-06-05 NOTE — Telephone Encounter (Signed)
-----   Message from [place], [facility] sent at 06/05/2020  9:42 AM EST ----- No I do not see a new one only the old expired one. I will include Dr. Valentina Lucks to see if he can send.  Dr. Valentina Lucks - do you mind just refilling Mr. Locke Barrell. It is an old prescription as he was getting it through PAP but was denied for this year so Rosendo Gros was trying to figure out copay at pharmacy.   Thank you! ----- Message ----- From: Vista Deck, CPhT Sent: 06/05/2020   9:38 AM EST To: Hughes Better, RPH  Unless Im not looking at the med list & the order details correctly, I dont think so. But let me call the pharmacy and see if they have it. I'll keep you posted.  ----- Message ----- From: Hughes Better, [facility] Sent: 06/05/2020   9:17 AM EST To: Vista Deck, CPhT  Yes I did ask Dr. Oleh Genin to send it. Has it not been sent yet?  ----- Message ----- From: Vista Deck, CPhT Sent: 06/05/2020   9:05 AM EST To: Hughes Better, [place]  Do you know if they still wanted to send the Jardiance in for him? Just let me know, I'll call the pharmacy to see about the copay & we can move forward.  ----- Message ----- From: Hughes Better, [facility] Sent: 06/03/2020   3:40 PM EST To: Vista Deck, CPhT  Camille,  I am going to call patient in one week. In the meantime want to try to figure out SGLT 2 coverage. Will ask provider to send in rx for Jardiance appears to be Tier 3. Could you follow-up on Friday to see if it was sent in and call pharmacy for cost. If it wasn't called in I will ask Laurey Arrow to send it. If expensive then we can see if he will qualify for [place].  Thank you! Rachelle

## 2020-06-08 NOTE — Telephone Encounter (Signed)
Noted and agree. 

## 2020-06-17 DIAGNOSIS — Z7984 Long term (current) use of oral hypoglycemic drugs: Secondary | ICD-10-CM | POA: Diagnosis not present

## 2020-06-17 DIAGNOSIS — H524 Presbyopia: Secondary | ICD-10-CM | POA: Diagnosis not present

## 2020-06-17 DIAGNOSIS — E119 Type 2 diabetes mellitus without complications: Secondary | ICD-10-CM | POA: Diagnosis not present

## 2020-06-17 DIAGNOSIS — H25813 Combined forms of age-related cataract, bilateral: Secondary | ICD-10-CM | POA: Diagnosis not present

## 2020-06-17 DIAGNOSIS — H52203 Unspecified astigmatism, bilateral: Secondary | ICD-10-CM | POA: Diagnosis not present

## 2020-06-17 LAB — HM DIABETES EYE EXAM

## 2020-07-07 ENCOUNTER — Other Ambulatory Visit: Payer: Self-pay | Admitting: Family Medicine

## 2020-07-07 DIAGNOSIS — E119 Type 2 diabetes mellitus without complications: Secondary | ICD-10-CM

## 2020-07-07 MED ORDER — CANAGLIFLOZIN 100 MG PO TABS: 100.0000 mg | ORAL_TABLET | Freq: Every day | ORAL | 0 refills | Status: DC

## 2020-07-07 MED ORDER — DAPAGLIFLOZIN PROPANEDIOL 5 MG PO TABS: 5.0000 mg | ORAL_TABLET | Freq: Every day | ORAL | 0 refills | Status: DC

## 2020-07-07 NOTE — Progress Notes (Signed)
Sending in prescriptions for [place] per pharmacy request to determine what pricing the patient will be able to afford for his diabetic medications.  Nicholas Mach, DO

## 2020-07-07 NOTE — Telephone Encounter (Signed)
Thanks for sending along! [name] just called me actually! I hate to do this but could you send in a prescription for Invokana and Farxiga. I can't tell the prices of them until the pharmacy runs them but I want to see if either are slightly cheaper (both on formulary) compared to [place].

## 2020-07-14 ENCOUNTER — Ambulatory Visit: Payer: Medicare HMO

## 2020-07-14 ENCOUNTER — Other Ambulatory Visit: Payer: Self-pay | Admitting: Family Medicine

## 2020-07-14 DIAGNOSIS — E119 Type 2 diabetes mellitus without complications: Secondary | ICD-10-CM

## 2020-07-14 MED ORDER — EMPAGLIFLOZIN 25 MG PO TABS: 25.0000 mg | ORAL_TABLET | Freq: Every day | ORAL | 0 refills | Status: DC

## 2020-07-21 ENCOUNTER — Ambulatory Visit (INDEPENDENT_AMBULATORY_CARE_PROVIDER_SITE_OTHER): Payer: Medicare HMO

## 2020-07-21 VITALS — Ht 63.0 in | Wt 195.0 lb

## 2020-07-21 DIAGNOSIS — Z0000 Encounter for general adult medical examination without abnormal findings: Secondary | ICD-10-CM

## 2020-07-21 NOTE — Progress Notes (Addendum)
Subjective:   [name] is a 70 y.o. male who presents for Medicare Annual/Subsequent preventive examination.  The patient consented to a virtual visit. Patient consented to have virtual visit and was identified by name and date of birth. Method of visit: Telephone   Encounter participants: Patient: [name] - located at Home Nurse/Provider: Dorna Bloom - located at [facility] Others (if applicable): NA  Review of Systems: Defer to PCP  Cardiac Risk Factors include: advanced age (>16men, >36 women);diabetes mellitus;hypertension;male gender  Objective:    Vitals: Ht $RemoveBe'5\' 3"'BmvYTNYyE$  (1.6 m)   Wt 195 lb (88.5 kg)   BMI 34.54 kg/m   Body mass index is 34.54 kg/m.  Advanced Directives 07/21/2020 05/13/2020 02/07/2019 05/29/2018 05/16/2018 05/14/2018 03/12/2018  Does Patient Have a Medical Advance Directive? No No No No No No No  Would patient like information on creating a medical advance directive? No - Patient declined No - Patient declined No - Patient declined No - Patient declined No - Patient declined No - Patient declined No - Patient declined   Tobacco Social History   Tobacco Use  Smoking Status Former Smoker  . Packs/day: 0.50  . Years: 10.00  . Pack years: 5.00  . Types: Cigarettes  . Quit date: 04/25/1993  . Years since quitting: 27.2  Smokeless Tobacco Never Used     Counseling given: No plans to restart   Clinical Intake:  Pre-visit preparation completed: Yes  Pain Score: 0-No pain  How often do you need to have someone help you when you read instructions, pamphlets, or other written materials from your doctor or pharmacy?: 3 - Sometimes What is the last grade level you completed in school?: 10th grade  Interpreter Needed?: No  Past Medical History:  Diagnosis Date  . Diabetes mellitus without complication ([place])   . Hypercholesterolemia   . Hypertension   . MI (myocardial infarction) [facility])    Past Surgical History:  Procedure Laterality Date  . CORONARY  ANGIOPLASTY WITH STENT PLACEMENT     2004/2005 per patient   Family History  Problem Relation Age of Onset  . Heart disease Mother   . Diabetes Mother   . Hypertension Mother   . Alcoholism Father   . Heart disease Father   . Hypertension Sister   . Diabetes Brother   . Hypercholesterolemia Brother   . Osteoporosis Brother   . Hypertension Brother   . Kidney disease Brother    Social History   Socioeconomic History  . Marital status: Married    Spouse name: [name]  . Number of children: 4  . Years of education: 10  . Highest education level: 10th grade  Occupational History  . Occupation: retired  Tobacco Use  . Smoking status: Former Smoker    Packs/day: 0.50    Years: 10.00    Pack years: 5.00    Types: Cigarettes    Quit date: 04/25/1993    Years since quitting: 27.2  . Smokeless tobacco: Never Used  Substance and Sexual Activity  . Alcohol use: No  . Drug use: No  . Sexual activity: Yes    Birth control/protection: Post-menopausal  Other Topics Concern  . Not on file  Social History Narrative   Patient lives with his wife in [place].   Patent has 4 boys, one lives in [place].    Patient enjoys fishing, camping in the woods, working outside, anything outdoor related.   Social Determinants of Health   Financial Resource Strain: Low Risk   .  Difficulty of Paying Living Expenses: Not hard at all  Food Insecurity: No Food Insecurity  . Worried About Charity fundraiser in the Last Year: Never true  . Ran Out of Food in the Last Year: Never true  Transportation Needs: No Transportation Needs  . Lack of Transportation (Medical): No  . Lack of Transportation (Non-Medical): No  Physical Activity: Sufficiently Active  . Days of Exercise per Week: 3 days  . Minutes of Exercise per Session: 50 min  Stress: No Stress Concern Present  . Feeling of Stress : Not at all  Social Connections: Moderately Integrated  . Frequency of Communication with Friends and Family:  More than three times a week  . Frequency of Social Gatherings with Friends and Family: More than three times a week  . Attends Religious Services: More than 4 times per year  . Active Member of Clubs or Organizations: No  . Attends Archivist Meetings: Never  . Marital Status: Married   Outpatient Encounter Medications as of 07/21/2020  Medication Sig  . Accu-Chek Softclix Lancets lancets USE AS DIRECTED UP TO FOUR TIMES DAILY  . amLODipine (NORVASC) 10 MG tablet Take 1 tablet (10 mg total) by mouth at bedtime.  Marland Kitchen aspirin EC 81 MG tablet Take 81 mg by mouth daily.  . BD PEN NEEDLE NANO 2ND GEN 32G X 4 MM MISC USE AS DIRECTED DAILY  . blood glucose meter kit and supplies Dispense based on patient and insurance preference. Use up to four times daily as directed. (FOR ICD-10 E10.9, E11.9).  . chlorthalidone (HYGROTON) 25 MG tablet TAKE 1/2 TABLET(12.5 MG) BY MOUTH DAILY  . empagliflozin (JARDIANCE) 25 MG TABS tablet Take 1 tablet (25 mg total) by mouth daily.  Marland Kitchen glucose blood (ACCU-CHEK GUIDE) test strip Use as instructed  . Insulin Glargine (BASAGLAR KWIKPEN) 100 UNIT/ML SOPN Inject 0.3 mLs (30 Units total) into the skin daily.  Marland Kitchen losartan (COZAAR) 100 MG tablet TAKE 1 TABLET(100 MG) BY MOUTH DAILY  . metFORMIN (GLUCOPHAGE) 500 MG tablet TAKE 2 TABLETS(1000 MG) BY MOUTH TWICE DAILY WITH A MEAL  . rosuvastatin (CRESTOR) 40 MG tablet Take 1 tablet (40 mg total) by mouth daily.  . canagliflozin (INVOKANA) 100 MG TABS tablet Take 1 tablet (100 mg total) by mouth daily. (Patient not taking: Reported on 07/21/2020)  . dapagliflozin propanediol (FARXIGA) 5 MG TABS tablet Take 1 tablet (5 mg total) by mouth daily. (Patient not taking: Reported on 07/21/2020)  . glipiZIDE (GLUCOTROL) 10 MG tablet TAKE 1 TABLET BY MOUTH DAILY BEFORE BREAKFAST (Patient not taking: Reported on 07/21/2020)   No facility-administered encounter medications on file as of 07/21/2020.   Activities of Daily Living In  your present state of health, do you have any difficulty performing the following activities: 07/21/2020  Hearing? N  Vision? N  Difficulty concentrating or making decisions? N  Walking or climbing stairs? N  Dressing or bathing? N  Doing errands, shopping? N  Preparing Food and eating ? N  Using the Toilet? N  In the past six months, have you accidently leaked urine? N  Do you have problems with loss of bowel control? N  Managing your Medications? N  Managing your Finances? N  Housekeeping or managing your Housekeeping? N  Some recent data might be hidden   Patient Care Team: [name], DO as PCP - General (Family Medicine)   Assessment:   This is a routine wellness examination for Bud.  Exercise Activities and Dietary recommendations  Current Exercise Habits: Home exercise routine, Type of exercise: walking, Time (Minutes): 50, Frequency (Times/Week): 3, Weekly Exercise (Minutes/Week): 150, Exercise limited by: cardiac condition(s)  Goals    .  DIET - INCREASE WATER INTAKE    .  I would like to optimize my diabetes management (pt-stated)      Current Barriers:  . Diabetes: F6EP; complicated by chronic medical conditions including HTN, HLD most recent A1c 8.1% on 10/13 . Current antihyperglycemic regimen: Basaglar 30 units, Jardiance 25mg , glipizde, metformin o Instructions given for patient assistance process o Patient tolerating well o 06/11/19-Patient approved for Basaglar-pt to call [facility] [phone] to set up shipment . Denies hypoglycemic symptoms, Denies hyperglycemic symptoms . Current meal patterns: o Breakfast: eggs, bacon, 1 piece of toast o Lunch: not often o Supper: fried chicken/protein, greens/veggie, discuss light carb is ok o Snacks: veggies/protein o Drinks: coffee with light creamer (no sugar), discussed avoiding juices/sugary drinks/sodas . Current exercise: walking (recent knee surgery, but recovering) . Current blood glucose readings:  108-127 . Cardiovascular risk reduction: ASCVD risk score is >20%  - high intensity statin/ASA as below o Current hypertensive regimen: amlodipine, chlorthalidone, losartan (BP goal <130/80 mmHg.) o Current hyperlipidemia regimen: rosuvastatin 20mg  daily o Current antiplatelet regimen: ASA 81  Pharmacist Clinical Goal(s):  Marland Kitchen Over the next 90 days, patient will work with PharmD and primary care provider to address needs related to optimized medication management of diabetes  Interventions: . Comprehensive medication review performed, medication list updated in electronic medical record . Reviewed & discussed the following diabetes-related information with patient: o Reviewed ADA recommended "diabetes-friendly" diet  (reviewed healthy snack/food options) o Discussed insulin injection technique; Patient uses AccuCheck glucometer o Reviewed medication purpose/side effects-->patient denies adverse events  Patient Self Care Activities:  . Patient will check blood glucose daily (or if symptomatic), document, and provide at future appointments . Patient will focus on medication adherence  . Patient will take medications as prescribed . Patient will contact provider with any episodes of hypoglycemia . Patient will report any questions or concerns to provider   Please see past updates related to this goal by clicking on the "Past Updates" button in the selected goal        Fall Risk Fall Risk  07/21/2020 05/13/2020 02/05/2019 05/29/2018 05/14/2018  Falls in the past year? 0 0 0 0 0   Is the patient's home free of loose throw rugs in walkways, pet beds, electrical cords, etc?   yes      Grab bars in the bathroom? yes      Handrails on the stairs?   yes      Adequate lighting?   yes  Patient rating of health (0-10): 9  Depression Screen PHQ 2/9 Scores 07/21/2020 07/21/2020 05/13/2020 05/13/2020  PHQ - 2 Score 0 0 0 0  PHQ- 9 Score - - 0 0   Cognitive Function  6CIT Screen 07/21/2020  What  Year? 0 points  What month? 0 points  What time? 0 points  Count back from 20 2 points  Months in reverse 2 points  Repeat phrase 0 points  Total Score 4   Immunization History  Administered Date(s) Administered  . Fluad Quad(high Dose 65+) 02/05/2019  . Influenza,inj,Quad PF,6+ Mos 03/02/2018, 05/13/2020  . Pneumococcal Conjugate-13 05/13/2020  . Pneumococcal Polysaccharide-23 05/14/2018   Screening Tests Health Maintenance  Topic Date Due  . Hepatitis C Screening  Never done  . COVID-19 Vaccine (1) Never done  . TETANUS/TDAP  Never  done  . COLONOSCOPY (Pts 45-29yrs Insurance coverage will need to be confirmed)  Never done  . FOOT EXAM  03/13/2019  . OPHTHALMOLOGY EXAM  12/25/2019  . HEMOGLOBIN A1C  08/11/2020  . INFLUENZA VACCINE  Completed  . PNA vac Low Risk Adult  Completed  . HPV VACCINES  Aged Out   Cancer Screenings: Lung: Low Dose CT Chest recommended if Age 33-80 years, 30 pack-year currently smoking OR have quit w/in 15years. Patient does not qualify. Colorectal: Patient reports ~3-4 years ago in [place].  Additional Screenings: Hepatitis C Screening: Not done HIV Screening: Not done  Plan:  PCP apt 4/22. Consider Covid Vaccine- we have them here at [facility].  Fill out an advance directive. ROI for colonoscopy.   I have personally reviewed and noted the following in the patient's chart:   . Medical and social history . Use of alcohol, tobacco or illicit drugs  . Current medications and supplements . Functional ability and status . Nutritional status . Physical activity . Advanced directives . List of other physicians . Hospitalizations, surgeries, and ER visits in previous 12 months . Vitals . Screenings to include cognitive, depression, and falls . Referrals and appointments  In addition, I have reviewed and discussed with patient certain preventive protocols, quality metrics, and best practice recommendations. A written personalized care plan for  preventive services as well as general preventive health recommendations were provided to patient.  This visit was conducted virtually in the setting of the [place] pandemic.    Dorna Bloom, [place]  07/21/2020     I have reviewed this visit and agree with the documentation.  Alana Lilland, DO

## 2020-07-21 NOTE — Patient Instructions (Addendum)
You spoke to [name], [place] over the phone for your annual wellness visit.  We discussed goals: Goals    .  DIET - INCREASE WATER INTAKE    .  I would like to optimize my diabetes management (pt-stated)      Current Barriers:  . Diabetes: A6TK; complicated by chronic medical conditions including HTN, HLD most recent A1c 8.1% on 10/13 . Current antihyperglycemic regimen: Basaglar 30 units, Jardiance 25mg , glipizde, metformin o Instructions given for patient assistance process o Patient tolerating well o 06/11/19-Patient approved for Basaglar-pt to call [facility] [phone] to set up shipment . Denies hypoglycemic symptoms, Denies hyperglycemic symptoms . Current meal patterns: o Breakfast: eggs, bacon, 1 piece of toast o Lunch: not often o Supper: fried chicken/protein, greens/veggie, discuss light carb is ok o Snacks: veggies/protein o Drinks: coffee with light creamer (no sugar), discussed avoiding juices/sugary drinks/sodas . Current exercise: walking (recent knee surgery, but recovering) . Current blood glucose readings: 108-127 . Cardiovascular risk reduction: ASCVD risk score is >20%  - high intensity statin/ASA as below o Current hypertensive regimen: amlodipine, chlorthalidone, losartan (BP goal <130/80 mmHg.) o Current hyperlipidemia regimen: rosuvastatin 20mg  daily o Current antiplatelet regimen: ASA 81  Pharmacist Clinical Goal(s):  Marland Kitchen Over the next 90 days, patient will work with PharmD and primary care provider to address needs related to optimized medication management of diabetes  Interventions: . Comprehensive medication review performed, medication list updated in electronic medical record . Reviewed & discussed the following diabetes-related information with patient: o Reviewed ADA recommended "diabetes-friendly" diet  (reviewed healthy snack/food options) o Discussed insulin injection technique; Patient uses AccuCheck glucometer o Reviewed medication  purpose/side effects-->patient denies adverse events  Patient Self Care Activities:  . Patient will check blood glucose daily (or if symptomatic), document, and provide at future appointments . Patient will focus on medication adherence  . Patient will take medications as prescribed . Patient will contact provider with any episodes of hypoglycemia . Patient will report any questions or concerns to provider   Please see past updates related to this goal by clicking on the "Past Updates" button in the selected goal        We also discussed recommended health maintenance. As discussed, you are due for the following. These health maintenance topics can be addressed at next PCP apt.  Health Maintenance  Topic Date Due  . Hepatitis C Screening  Never done  . COVID-19 Vaccine (1) Never done  . TETANUS/TDAP  Never done  . COLONOSCOPY (Pts 45-54yrs Insurance coverage will need to be confirmed)  Never done  . FOOT EXAM  03/13/2019  . OPHTHALMOLOGY EXAM  12/25/2019  . HEMOGLOBIN A1C  08/11/2020  . INFLUENZA VACCINE  Completed  . PNA vac Low Risk Adult  Completed  . HPV VACCINES  Aged Out   PCP apt 4/22. Consider Covid Vaccine- we have them here at [facility].  Fill out an advance directive. ROI for colonoscopy.   Health Maintenance, Male Adopting a healthy lifestyle and getting preventive care are important in promoting health and wellness. Ask your health care provider about:  The right schedule for you to have regular tests and exams.  Things you can do on your own to prevent diseases and keep yourself healthy. What should I know about diet, weight, and exercise? Eat a healthy diet  Eat a diet that includes plenty of vegetables, fruits, low-fat dairy products, and lean protein.  Do not eat a lot of foods that are high  in solid fats, added sugars, or sodium.   Maintain a healthy weight Body mass index (BMI) is a measurement that can be used to identify possible weight problems. It  estimates body fat based on height and weight. Your health care provider can help determine your BMI and help you achieve or maintain a healthy weight. Get regular exercise Get regular exercise. This is one of the most important things you can do for your health. Most adults should:  Exercise for at least 150 minutes each week. The exercise should increase your heart rate and make you sweat (moderate-intensity exercise).  Do strengthening exercises at least twice a week. This is in addition to the moderate-intensity exercise.  Spend less time sitting. Even light physical activity can be beneficial. Watch cholesterol and blood lipids Have your blood tested for lipids and cholesterol at 70 years of age, then have this test every 5 years. You may need to have your cholesterol levels checked more often if:  Your lipid or cholesterol levels are high.  You are older than 70 years of age.  You are at high risk for heart disease. What should I know about cancer screening? Many types of cancers can be detected early and may often be prevented. Depending on your health history and family history, you may need to have cancer screening at various ages. This may include screening for:  Colorectal cancer.  Prostate cancer.  Skin cancer.  Lung cancer. What should I know about heart disease, diabetes, and high blood pressure? Blood pressure and heart disease  High blood pressure causes heart disease and increases the risk of stroke. This is more likely to develop in people who have high blood pressure readings, are of African descent, or are overweight.  Talk with your health care provider about your target blood pressure readings.  Have your blood pressure checked: ? Every 3-5 years if you are 49-41 years of age. ? Every year if you are 55 years old or older.  If you are between the ages of 10 and 45 and are a current or former smoker, ask your health care provider if you should have a  one-time screening for abdominal aortic aneurysm (AAA). Diabetes Have regular diabetes screenings. This checks your fasting blood sugar level. Have the screening done:  Once every three years after age 36 if you are at a normal weight and have a low risk for diabetes.  More often and at a younger age if you are overweight or have a high risk for diabetes. What should I know about preventing infection? Hepatitis B If you have a higher risk for hepatitis B, you should be screened for this virus. Talk with your health care provider to find out if you are at risk for hepatitis B infection. Hepatitis C Blood testing is recommended for:  Everyone born from [birth year] through 1965.  Anyone with known risk factors for hepatitis C. Sexually transmitted infections (STIs)  You should be screened each year for STIs, including gonorrhea and chlamydia, if: ? You are sexually active and are younger than 70 years of age. ? You are older than 70 years of age and your health care provider tells you that you are at risk for this type of infection. ? Your sexual activity has changed since you were last screened, and you are at increased risk for chlamydia or gonorrhea. Ask your health care provider if you are at risk.  Ask your health care provider about whether you are at high  risk for HIV. Your health care provider may recommend a prescription medicine to help prevent HIV infection. If you choose to take medicine to prevent HIV, you should first get tested for HIV. You should then be tested every 3 months for as long as you are taking the medicine. Follow these instructions at home: Lifestyle  Do not use any products that contain nicotine or tobacco, such as cigarettes, e-cigarettes, and chewing tobacco. If you need help quitting, ask your health care provider.  Do not use street drugs.  Do not share needles.  Ask your health care provider for help if you need support or information about quitting  drugs. Alcohol use  Do not drink alcohol if your health care provider tells you not to drink.  If you drink alcohol: ? Limit how much you have to 0-2 drinks a day. ? Be aware of how much alcohol is in your drink. In the [place] one drink equals one 12 oz bottle of beer (355 mL), one 5 oz glass of wine (148 mL), or one 1 oz glass of hard liquor (44 mL). General instructions  Schedule regular health, dental, and eye exams.  Stay current with your vaccines.  Tell your health care provider if: ? You often feel depressed. ? You have ever been abused or do not feel safe at home. Summary  Adopting a healthy lifestyle and getting preventive care are important in promoting health and wellness.  Follow your health care provider's instructions about healthy diet, exercising, and getting tested or screened for diseases.  Follow your health care provider's instructions on monitoring your cholesterol and blood pressure. This information is not intended to replace advice given to you by your health care provider. Make sure you discuss any questions you have with your health care provider. Document Revised: 04/04/2018 Document Reviewed: 04/04/2018 Elsevier Patient Education  2021 [place].  Our clinic's number is [phone]. Please call with questions or concerns about what we discussed today.

## 2020-07-27 ENCOUNTER — Other Ambulatory Visit: Payer: Self-pay

## 2020-07-27 DIAGNOSIS — E119 Type 2 diabetes mellitus without complications: Secondary | ICD-10-CM

## 2020-08-04 DIAGNOSIS — H52209 Unspecified astigmatism, unspecified eye: Secondary | ICD-10-CM | POA: Diagnosis not present

## 2020-08-04 DIAGNOSIS — H524 Presbyopia: Secondary | ICD-10-CM | POA: Diagnosis not present

## 2020-08-04 DIAGNOSIS — H5213 Myopia, bilateral: Secondary | ICD-10-CM | POA: Diagnosis not present

## 2020-08-11 ENCOUNTER — Other Ambulatory Visit: Payer: Self-pay

## 2020-08-11 MED ORDER — BD PEN NEEDLE NANO 2ND GEN 32G X 4 MM MISC: 2 refills | Status: DC

## 2020-08-14 ENCOUNTER — Other Ambulatory Visit: Payer: Self-pay

## 2020-08-14 ENCOUNTER — Ambulatory Visit (INDEPENDENT_AMBULATORY_CARE_PROVIDER_SITE_OTHER): Payer: Medicare HMO | Admitting: Family Medicine

## 2020-08-14 ENCOUNTER — Encounter: Payer: Self-pay | Admitting: Family Medicine

## 2020-08-14 VITALS — BP 143/97 | HR 112 | Ht 63.0 in | Wt 192.1 lb

## 2020-08-14 DIAGNOSIS — R350 Frequency of micturition: Secondary | ICD-10-CM

## 2020-08-14 DIAGNOSIS — E119 Type 2 diabetes mellitus without complications: Secondary | ICD-10-CM | POA: Diagnosis not present

## 2020-08-14 LAB — POCT URINALYSIS DIP (MANUAL ENTRY)
Bilirubin, UA: NEGATIVE
Blood, UA: NEGATIVE
Glucose, UA: >=1000 mg/dL — AB
Leukocytes, UA: NEGATIVE
Nitrite, UA: NEGATIVE
Protein Ur, POC: 100 mg/dL — AB
Spec Grav, UA: 1.015 (ref 1.010–1.025)
Urobilinogen, UA: 0.2 E.U./dL
pH, UA: 5 (ref 5.0–8.0)

## 2020-08-14 LAB — POCT GLYCOSYLATED HEMOGLOBIN (HGB A1C): HbA1c, POC (controlled diabetic range): 10.7 % — AB (ref 0.0–7.0)

## 2020-08-14 LAB — POCT UA - MICROSCOPIC ONLY

## 2020-08-14 NOTE — Progress Notes (Addendum)
SUBJECTIVE:   CHIEF COMPLAINT / HPI:   Dizziness Patient reports that for the last week he has been feeling increased dizziness, especially if he is turning his head a certain way.  He reports that if he tilts his head backwards or turns it to the left or lays on the left side that he will have vertigo with the room spinning, he also states this happens when he gets up quick.  He reports he has not had any recent sickness, no associated nausea/vomiting, no blurry vision, no headache, no tinnitus.  Does check his blood pressure during the day but did not check it during any of his dizzy episodes.  He does report that these dizzy episodes can make him feel like he is staggering when walking. Patient originally stated it was just dizziness but then at the end of the visit that when he was in the lab he was able to illicit the feeling of the "room spinning" when he tilts his head back and to the left a little bit.   Urinary incontinence Patient reports that he has had an increase amount of frequency and urgency which is caused him to have an episode of urinary incontinence.  He reports he has been having this issue for the last 2 weeks, no reported back injury.  Does report an increased amount of water intake at night since he has been on the glyburide and was transitioned to [place].  T2DM Patient reports compliance with his insulin, metformin, Jardiance.  Reports he has had an increased amount of urinary frequency and increased thirst since starting the Jardiance.  Reports her blood sugars at home have been between the 140s to 150s, no lows below 130   PERTINENT  PMH / PSH: HTN, T2DM, hypercholesterolemia  OBJECTIVE:   BP (!) 143/97   Pulse (!) 112   Ht 5\' 3"  (1.6 m)   Wt 192 lb 2 oz (87.1 kg)   BMI 34.03 kg/m   Gen: well-appearing, NAD CV: RRR, no m/r/g appreciated, no peripheral edema Pulm: CTAB, no wheezes/crackles Orthostatic vitals completed during visit as below:  - Lying:  157/81, HR 112  - Sitting 139/89, HR 112  - Standing 141/96, HR 117 Neuro: No focal deficits identified, patient was able to walk down the hallway without obviously abnormal gait, would benefit from more in-depth exam  ASSESSMENT/PLAN:  Dizziness Orthostatic vitals do not meet criteria for orthostatic hypotension.  Given patient's report that dizziness/vertigo is worse when tilting head in certain direction there is consideration that it could be BPPV versus possible percentage of vertebral or carotid stenosis (though this would be less likely). Considered cerebellar cause but patient's symptoms were episodic and he was only able to elicit the symptoms when turning his head a specific way; he had no associated nausea or vomiting with these episodes and otherwise reported that he had no other symptoms than dizziness. When patient was initially questioned, he stated that it was just dizziness, but when he returned from lab he stated that when he tilted his head back and to the left he was "able to make the room spin". Patient was given strict ED precautions if this is worsening or if it becomes associated with nausea/vomiting.  Patient struck to follow-up with the clinic with pharmacy to determine if there are any medications that could be contributing or if this could be related to the [place].  Patient also instructed to follow-up within no more than 2 weeks with me in the  clinic for further evaluation of the dizziness. -BMP collected -Orthostatic vitals were completed during this visit -Patient given ED precautions and clinic follow-up instructions -Further in-depth neuro exam at next visit if still reporting dizziness   Increased urination Patient with increased urination with recent switch in diabetic medications to [place].  Jardiance with known increased frequency of urination and glucosuria, consideration that was sugars elevated substantially patient may have significant amount of urination  and possible dehydration.  We will obtain a BMP to ensure that there is no anion gap or euglycemic DKA as well as no current kidney injury.  UA was obtained with glucose urea of >1000.  - concern for euglycemic DKA will get BMP to check AG - If worsening symptoms then needs to return  - Consider decreasing Jardiance to 10mg  if BMP normal   T2DM HbA1c increased to 10.7 (previously 8.23 months ago).  Patient had medication changes to determine what would be more financially possible for patient.  Patient will likely either need to change or increase medications, with recent increased urination may need to decrease Jardiace but increase insulin.  Insulin does not have any morbidity/mortality benefit but patient's sugars are significantly elevated. -Patient instructed to follow-up with pharmacy team within the next several days -Consider increasing insulin -Consider decreasing Jardiance    ADDENDUM: Anion gap metabolic acidosis, c/f euglycemic DKA  Patient BMP resulted with Cl 142 (144 when corrected for glucose), Cl 101, CO2 16, high anion gap of 25, and glucose of 256. Patient is also reporting dizziness and is currently on Jardiance. Jardiance does have risk of euglycemic DKA given he is on Jardiance and there are trace ketones present in the urine. Have attempted to call the patient several times with strong recommendation to go to the ED for further work-up to include getting fluids for AKI and to adjust diabetic medications. Patient will need to discontinue Jardiance and we will change his diabetic medications, likely to increase insulin and determine what other medication to use. Patient had pancreatitis with Trul[place] and now possible side effect from [place] and would not recommend these 2 classes of medications.  - Will continue to attempt contact with patient - Voicemail left for patient instructing him to go to the ED.   AKI,  Creatinine was also elevated to 1.69, with prior Cr 1.06 in  03/2020. Possibly in the setting of dehydration given increased urination per patient. Patient will need fluids to return to baseline kidney function and will likely need further evaluation per lab results above.  - as above, will continue to attempt calling patient       [name], [place]

## 2020-08-14 NOTE — Patient Instructions (Addendum)
It is important to bring all of your medications with you to your next visit.  I want you to schedule an appointment with pharmacy for next week in order to make sure that all her medications are okay and if we need to make any changes.  I am getting some lab work today to see and make sure your diabetes is doing okay.  Your A1c did bump up to 10.7, I do anticipate that we need to change medications whether that is increasing your insulin or adding back on another medication.  I will call you with the lab results, especially if they are concerning and we need to act on them quickly.  The other possibility for causes of your dizziness could be related to some of your blood vessels with the way that your head is turning but we will need to make sure that your diabetes and other issues are doing okay.  If your dizziness is getting worse, you are feeling like your heart is beating really fast or you feel like you are about to pass out I do want you to go to the ED immediately.  If you start to notice that you are having nausea and vomiting I would like you to get evaluated then as well.

## 2020-08-15 ENCOUNTER — Emergency Department (HOSPITAL_COMMUNITY)
Admission: EM | Admit: 2020-08-15 | Discharge: 2020-08-15 | Disposition: A | Discharge status: Home / Self Care | Payer: Medicare HMO | Attending: Emergency Medicine | Admitting: Emergency Medicine

## 2020-08-15 ENCOUNTER — Encounter (HOSPITAL_COMMUNITY): Payer: Self-pay | Admitting: Emergency Medicine

## 2020-08-15 DIAGNOSIS — Z87891 Personal history of nicotine dependence: Secondary | ICD-10-CM | POA: Insufficient documentation

## 2020-08-15 DIAGNOSIS — X58XXXA Exposure to other specified factors, initial encounter: Secondary | ICD-10-CM | POA: Insufficient documentation

## 2020-08-15 DIAGNOSIS — I10 Essential (primary) hypertension: Secondary | ICD-10-CM | POA: Diagnosis not present

## 2020-08-15 DIAGNOSIS — T887XXA Unspecified adverse effect of drug or medicament, initial encounter: Secondary | ICD-10-CM | POA: Insufficient documentation

## 2020-08-15 DIAGNOSIS — Z7982 Long term (current) use of aspirin: Secondary | ICD-10-CM | POA: Insufficient documentation

## 2020-08-15 DIAGNOSIS — Z7984 Long term (current) use of oral hypoglycemic drugs: Secondary | ICD-10-CM | POA: Insufficient documentation

## 2020-08-15 DIAGNOSIS — R42 Dizziness and giddiness: Secondary | ICD-10-CM

## 2020-08-15 DIAGNOSIS — T50905A Adverse effect of unspecified drugs, medicaments and biological substances, initial encounter: Secondary | ICD-10-CM

## 2020-08-15 DIAGNOSIS — Z79899 Other long term (current) drug therapy: Secondary | ICD-10-CM | POA: Insufficient documentation

## 2020-08-15 DIAGNOSIS — E119 Type 2 diabetes mellitus without complications: Secondary | ICD-10-CM | POA: Insufficient documentation

## 2020-08-15 DIAGNOSIS — Z794 Long term (current) use of insulin: Secondary | ICD-10-CM | POA: Diagnosis not present

## 2020-08-15 LAB — CBC
HCT: 47.6 % (ref 39.0–52.0)
Hemoglobin: 14.4 g/dL (ref 13.0–17.0)
MCH: 27.7 pg (ref 26.0–34.0)
MCHC: 30.3 g/dL (ref 30.0–36.0)
MCV: 91.5 fL (ref 80.0–100.0)
Platelets: 281 10*3/uL (ref 150–400)
RBC: 5.2 MIL/uL (ref 4.22–5.81)
RDW: 12.6 % (ref 11.5–15.5)
WBC: 9.1 10*3/uL (ref 4.0–10.5)
nRBC: 0 % (ref 0.0–0.2)

## 2020-08-15 LAB — BASIC METABOLIC PANEL
Anion gap: 13 (ref 5–15)
BUN/Creatinine Ratio: 19 (ref 10–24)
BUN: 32 mg/dL — ABNORMAL HIGH (ref 8–27)
BUN: 34 mg/dL — ABNORMAL HIGH (ref 8–23)
CO2: 16 mmol/L — ABNORMAL LOW (ref 20–29)
CO2: 23 mmol/L (ref 22–32)
Calcium: 10.6 mg/dL — ABNORMAL HIGH (ref 8.6–10.2)
Calcium: 9.7 mg/dL (ref 8.9–10.3)
Chloride: 101 mmol/L (ref 96–106)
Chloride: 104 mmol/L (ref 98–111)
Creatinine, Ser: 1.69 mg/dL — ABNORMAL HIGH (ref 0.76–1.27)
Creatinine, Ser: 1.44 mg/dL — ABNORMAL HIGH (ref 0.61–1.24)
GFR, Estimated: 53 mL/min — ABNORMAL LOW (ref >60)
Glucose, Bld: 256 mg/dL — ABNORMAL HIGH (ref 70–99)
Glucose: 256 mg/dL — ABNORMAL HIGH (ref 65–99)
Potassium: 4.4 mmol/L (ref 3.5–5.2)
Potassium: 4 mmol/L (ref 3.5–5.1)
Sodium: 142 mmol/L (ref 134–144)
Sodium: 140 mmol/L (ref 135–145)
eGFR: 43 mL/min/{1.73_m2} — ABNORMAL LOW (ref >59)

## 2020-08-15 LAB — I-STAT VENOUS BLOOD GAS, ED
Acid-Base Excess: 0 mmol/L (ref 0.0–2.0)
Bicarbonate: 24.7 mmol/L (ref 20.0–28.0)
Calcium, Ion: 1.13 mmol/L — ABNORMAL LOW (ref 1.15–1.40)
HCT: 44 % (ref 39.0–52.0)
Hemoglobin: 15 g/dL (ref 13.0–17.0)
O2 Saturation: 93 %
Potassium: 4 mmol/L (ref 3.5–5.1)
Sodium: 141 mmol/L (ref 135–145)
TCO2: 26 mmol/L (ref 22–32)
pCO2, Ven: 40.1 mmHg — ABNORMAL LOW (ref 44.0–60.0)
pH, Ven: 7.398 (ref 7.250–7.430)
pO2, Ven: 68 mmHg — ABNORMAL HIGH (ref 32.0–45.0)

## 2020-08-15 LAB — URINALYSIS, ROUTINE W REFLEX MICROSCOPIC
Bacteria, UA: NONE SEEN
Bilirubin Urine: NEGATIVE
Glucose, UA: >=500 mg/dL — AB
Hgb urine dipstick: NEGATIVE
Ketones, ur: NEGATIVE mg/dL
Leukocytes,Ua: NEGATIVE
Nitrite: NEGATIVE
Protein, ur: 30 mg/dL — AB
Specific Gravity, Urine: 1.026 (ref 1.005–1.030)
pH: 5 (ref 5.0–8.0)

## 2020-08-15 LAB — CBG MONITORING, ED: Glucose-Capillary: 236 mg/dL — ABNORMAL HIGH (ref 70–99)

## 2020-08-15 MED ORDER — SODIUM CHLORIDE 0.9 % IV SOLN
1000.0000 mL | INTRAVENOUS | Status: DC
  Administered 2020-08-15: 1000 mL via INTRAVENOUS

## 2020-08-15 MED ORDER — MECLIZINE HCL 25 MG PO TABS
12.5000 mg | ORAL_TABLET | Freq: Once | ORAL | Status: AC
  Administered 2020-08-15: 12.5 mg via ORAL
  Filled 2020-08-15: qty 1

## 2020-08-15 MED ORDER — SODIUM CHLORIDE 0.9 % IV BOLUS (SEPSIS)
1000.0000 mL | Freq: Once | INTRAVENOUS | Status: AC
  Administered 2020-08-15: 1000 mL via INTRAVENOUS

## 2020-08-15 NOTE — ED Provider Notes (Signed)
[facility] EMERGENCY DEPARTMENT Provider Note   CSN: [id number] Arrival date & time: 08/15/20  1104     History Chief Complaint  Patient presents with  . abnormal labs  . Dizziness    [name] is a 70 y.o. male.  HPI   Patient presented to the ED for evaluation of abnormal laboratory tests at his doctor's office yesterday.  Patient states he was at his doctor's office yesterday for routine follow-up.  He had been having some issues with dizziness but otherwise was feeling fine.  Patient states the dizziness would occur when he turns his head in certain positions.  He has not been having trouble with his balance coordination speech numbness or weakness.  Patient states he has noticed some increased urination.  Otherwise he feels well.  He has been eating and drinking normally.  No vomiting or diarrhea.  Patient had laboratory test at the doctor's office that showed his BUN and creatinine were elevated at 32 and 1.69.  He also had a bicarbonate decreased at 16 with an elevated anion gap.  Patient was instructed to come to the ED for further evaluation  Past Medical History:  Diagnosis Date  . Diabetes mellitus without complication ([place])   . Hypercholesterolemia   . Hypertension   . MI (myocardial infarction) [facility])     Patient Active Problem List   Diagnosis Date Noted  . Low-level of literacy 05/21/2018  . Globus sensation 03/12/2018  . Type 2 diabetes mellitus without complication, without long-term current use of insulin ([facility]) 06/09/2017  . Pure hypercholesterolemia 06/09/2017  . H/O heart artery stent 06/09/2017  . Essential hypertension, benign 06/09/2017    Past Surgical History:  Procedure Laterality Date  . CORONARY ANGIOPLASTY WITH STENT PLACEMENT     2004/2005 per patient       Family History  Problem Relation Age of Onset  . Heart disease Mother   . Diabetes Mother   . Hypertension Mother   . Alcoholism Father   . Heart disease Father    . Hypertension Sister   . Diabetes Brother   . Hypercholesterolemia Brother   . Osteoporosis Brother   . Hypertension Brother   . Kidney disease Brother     Social History   Tobacco Use  . Smoking status: Former Smoker    Packs/day: 0.50    Years: 10.00    Pack years: 5.00    Types: Cigarettes    Quit date: 04/25/1993    Years since quitting: 27.3  . Smokeless tobacco: Never Used  Substance Use Topics  . Alcohol use: No  . Drug use: No    Home Medications Prior to Admission medications   Medication Sig Start Date End Date Taking? Authorizing Provider  Accu-Chek Softclix Lancets lancets USE AS DIRECTED UP TO FOUR TIMES DAILY 06/10/19   Shirley, [place], DO  amLODipine (NORVASC) 10 MG tablet Take 1 tablet (10 mg total) by mouth at bedtime. 06/02/20   Lilland, Alana, DO  aspirin EC 81 MG tablet Take 81 mg by mouth daily.    [provider]  blood glucose meter kit and supplies Dispense based on patient and insurance preference. Use up to four times daily as directed. (FOR ICD-10 E10.9, E11.9). 05/13/20   Lilland, Alana, DO  chlorthalidone (HYGROTON) 25 MG tablet TAKE 1/2 TABLET(12.5 MG) BY MOUTH DAILY 03/10/20   Lilland, Alana, DO  glipiZIDE (GLUCOTROL) 10 MG tablet TAKE 1 TABLET BY MOUTH DAILY BEFORE BREAKFAST Patient not taking: No sig  reported 03/23/20   Lilland, Alana, DO  glucose blood (ACCU-CHEK GUIDE) test strip Use as instructed 05/21/20   Zenia Resides, MD  Insulin Glargine (BASAGLAR KWIKPEN) 100 UNIT/ML SOPN Inject 0.3 mLs (30 Units total) into the skin daily. 11/15/18   Zenia Resides, MD  Insulin Pen Needle (BD PEN NEEDLE NANO 2ND GEN) 32G X 4 MM MISC USE AS DIRECTED DAILY 08/11/20   Lilland, Alana, DO  losartan (COZAAR) 100 MG tablet TAKE 1 TABLET(100 MG) BY MOUTH DAILY 06/07/19   Shirley, [place], DO  metFORMIN (GLUCOPHAGE) 500 MG tablet TAKE 2 TABLETS(1000 MG) BY MOUTH TWICE DAILY WITH A MEAL 11/07/19   Lilland, Alana, DO  rosuvastatin (CRESTOR) 40 MG tablet  Take 1 tablet (40 mg total) by mouth daily. 05/17/20   Lilland, Lorrin Goodell, DO    Allergies    Patient has no known allergies.  Review of Systems   Review of Systems  All other systems reviewed and are negative.   Physical Exam Updated Vital Signs BP 133/79   Pulse 83   Temp 98.6 F (37 C) (Oral)   Resp 16   SpO2 99%   Physical Exam Vitals and nursing note reviewed.  Constitutional:      General: He is not in acute distress.    Appearance: He is well-developed.  HENT:     Head: Normocephalic and atraumatic.     Right Ear: External ear normal.     Left Ear: External ear normal.  Eyes:     General: No scleral icterus.       Right eye: No discharge.        Left eye: No discharge.     Conjunctiva/sclera: Conjunctivae normal.  Neck:     Trachea: No tracheal deviation.  Cardiovascular:     Rate and Rhythm: Normal rate and regular rhythm.  Pulmonary:     Effort: Pulmonary effort is normal. No respiratory distress.     Breath sounds: Normal breath sounds. No stridor. No wheezing or rales.  Abdominal:     General: Bowel sounds are normal. There is no distension.     Palpations: Abdomen is soft.     Tenderness: There is no abdominal tenderness. There is no guarding or rebound.  Musculoskeletal:        General: No tenderness.     Cervical back: Neck supple.  Skin:    General: Skin is warm and dry.     Findings: No rash.  Neurological:     Mental Status: He is alert and oriented to person, place, and time.     Cranial Nerves: No cranial nerve deficit (No facial droop, extraocular movements intact, tongue midline ).     Sensory: No sensory deficit.     Motor: No abnormal muscle tone or seizure activity.     Coordination: Coordination normal.     Comments: No pronator drift bilateral upper extrem, able to hold both legs off bed for 5 seconds, sensation intact in all extremities, no visual field cuts, no left or right sided neglect, normal finger-nose exam bilaterally, no  nystagmus noted      ED Results / Procedures / Treatments   Labs (all labs ordered are listed, but only abnormal results are displayed) Labs Reviewed  BASIC METABOLIC PANEL - Abnormal; Notable for the following components:      Result Value   Glucose, Bld 256 (*)    BUN 34 (*)    Creatinine, Ser 1.44 (*)    GFR, Estimated 53 (*)  All other components within normal limits  URINALYSIS, ROUTINE W REFLEX MICROSCOPIC - Abnormal; Notable for the following components:   Glucose, UA >=500 (*)    Protein, ur 30 (*)    All other components within normal limits  CBG MONITORING, ED - Abnormal; Notable for the following components:   Glucose-Capillary 236 (*)    All other components within normal limits  I-STAT VENOUS BLOOD GAS, ED - Abnormal; Notable for the following components:   pCO2, Ven 40.1 (*)    pO2, Ven 68.0 (*)    Calcium, Ion 1.13 (*)    All other components within normal limits  CBC    EKG EKG Interpretation  Date/Time:  Saturday August 15 2020 11:20:52 EDT Ventricular Rate:  94 PR Interval:  160 QRS Duration: 130 QT Interval:  374 QTC Calculation: 467 R Axis:   -10 Text Interpretation: Normal sinus rhythm Non-specific intra-ventricular conduction block Minimal voltage criteria for LVH, may be normal variant ( Cornell product ) No significant change since last tracing Confirmed by Dorie Rank [phone]) on 08/15/2020 11:55:31 AM   Radiology No results found.  Procedures Procedures   Medications Ordered in ED Medications  sodium chloride 0.9 % bolus 1,000 mL (1,000 mLs Intravenous New Bag/Given 08/15/20 1230)    Followed by  0.9 %  sodium chloride infusion (has no administration in time range)  meclizine (ANTIVERT) tablet 12.5 mg (has no administration in time range)    ED Course  I have reviewed the triage vital signs and the nursing notes.  Pertinent labs & imaging results that were available during my care of the patient were reviewed by me and considered in  my medical decision making (see chart for details).  Clinical Course as of 08/15/20 1305  Sat Aug 15, 2020  1229 Labs show normal bicarb.  Anion gap is normal [JK]  1229 CBC is normal [JK]  1303 Discussed with family medicine service.   will have patient follow-up in the office this week to review his medications [JK]    Clinical Course User Index [JK] Dorie Rank, MD   MDM Rules/Calculators/A&P                          Patient presented to the ED for evaluation of abnormal laboratory test performed at his doctor's office yesterday.  Patient's blood tests are much better today.  He does not have any evidence of persistent anion gap metabolic acidosis.  Patient does still have a slightly elevated creatinine compared to previous values although it is better than yesterday.  Patient was given a dose of IV fluids.  He is feeling well and ready for discharge.  Patient still has been having some this intermittent dizziness that does sound like peripheral vertigo.  He was given a dose of meclizine.  He is not have any findings to suggest stroke. Final Clinical Impression(s) / ED Diagnoses Final diagnoses:  Medication adverse effect, initial encounter  Vertigo    Rx / DC Orders ED Discharge Orders    None       Dorie Rank, MD 08/15/20 [phone]

## 2020-08-15 NOTE — Discharge Instructions (Addendum)
Your blood test today looked much better.  Stop taking your Jardiance diabetes medication.  Follow-up with your primary care doctor this week to discuss your diabetes medication regimen.

## 2020-08-15 NOTE — ED Triage Notes (Signed)
Pt seen by PCP yesterday and received call that he had abnormal labs (see note from PCP office)- elevated anion gap and AKI.  Reports dizziness since Wednesday.  Denies pain.

## 2020-08-19 ENCOUNTER — Other Ambulatory Visit: Payer: Self-pay

## 2020-08-19 ENCOUNTER — Ambulatory Visit (INDEPENDENT_AMBULATORY_CARE_PROVIDER_SITE_OTHER): Payer: Medicare HMO | Admitting: Family Medicine

## 2020-08-19 VITALS — BP 130/72 | HR 91 | Wt 194.6 lb

## 2020-08-19 DIAGNOSIS — E119 Type 2 diabetes mellitus without complications: Secondary | ICD-10-CM

## 2020-08-19 NOTE — Progress Notes (Signed)
    SUBJECTIVE:   CHIEF COMPLAINT / HPI: ED follow up   [name] is a 70 year old gentleman presenting for ED follow-up and continued intermittent dizziness.  ED follow-up  T2DM: He was seen in the ED on 4/23 due to abnormal labs and recent office visit.  He recently was on Jardiance and had concern for euglycemic DKA with elevated anion gap/decreased bicarb.  ED lab repeat were within normal limits, no anion gap acidosis present.  Sent home in stable condition.  He is now taking metformin twice daily and Basaglar for his diabetes, Jardiance discontinued.  Has follow-up with Dr. Valentina Francis tomorrow to discuss his diabetic regimen.  CBG 140 this morning after eating a small breakfast.  States he feels great today, other than dizziness discussed below.  Denies any nausea, vomiting, abdominal pain, fatigue.  Dizziness: Intermittent with head position changes only, will feel like the room is spinning for under a minute.  Present for the past 1.5 weeks.  Orthostatic vitals WNL during recent office visit.  He was prescribed some meclizine in the ED, felt it was peripheral in nature.  Denies any associated nausea, vomiting, blurred vision, headache, hearing changes, tinnitus, extremity weakness, numbness, speech changes, or facial droop.   PERTINENT  PMH / PSH: Type 2 diabetes, hypertension, hyperlipidemia  OBJECTIVE:   BP 130/72   Pulse 91   Wt 194 lb 9.6 oz (88.3 kg)   SpO2 96%   BMI 34.47 kg/m   General: Alert, NAD HEENT: NCAT, MMM Lungs: No increased WOB  Msk: Moves all extremities spontaneously  Ext: Warm, dry, 2+ distal pulses Neuro: Alert and oriented.  CN II-XII intact.  EOMI, PERRLA.  5/5 upper and lower extremity strength bilaterally.  Sensation to light touch intact.  Dix-Hallpike maneuver elicits sensation of vertigo when head placed to the left and horizontal nystagmus.  Performed Epley maneuver with resolution of vertigo.  ASSESSMENT/PLAN:   T2DM  ED Follow up  Poorly  controlled, A1c 10.7 on 4/22.  Reassuring BMP during ED evaluation, no concern for euglycemic DKA at this time.  Already discontinued Jardiance.  Will recheck BMP today to ensure stability and assess creatinine decline.  Continue metformin BID and Basaglar as is, follow-up with Dr. Valentina Francis tomorrow 4/28.  BPPV Presentation most consistent with peripheral vertigo, especially with elicited dizziness/nystagmus upon Dix-Hallpike maneuver and resolved following Epley's.  Otherwise neurologically intact without concern for CVA.  Provided information for Epley maneuver if needed to be repeated at home.  ED precautions discussed.   Essential hypertension Reasonable control.  Continue Norvasc 10 mg, losartan 100 mg, and chlorthalidone 12.5 mg daily as is.   Follow-up with Dr. Valentina Francis tomorrow, then with PCP for improved glycemic control.  [name], [place]

## 2020-08-19 NOTE — Patient Instructions (Addendum)
If the dizziness continues at home: Keep trying the "Epley Maneuver" on youtube for the "left side" --this will hopefully help stop the dizziness.   We will check your blood work again today. Keep your diabetes medications as is.

## 2020-08-20 ENCOUNTER — Ambulatory Visit (INDEPENDENT_AMBULATORY_CARE_PROVIDER_SITE_OTHER): Payer: Medicare HMO | Admitting: Pharmacist

## 2020-08-20 ENCOUNTER — Encounter: Payer: Self-pay | Admitting: Family Medicine

## 2020-08-20 ENCOUNTER — Other Ambulatory Visit: Payer: Self-pay

## 2020-08-20 ENCOUNTER — Encounter: Payer: Self-pay | Admitting: Pharmacist

## 2020-08-20 DIAGNOSIS — E119 Type 2 diabetes mellitus without complications: Secondary | ICD-10-CM | POA: Diagnosis not present

## 2020-08-20 DIAGNOSIS — I10 Essential (primary) hypertension: Secondary | ICD-10-CM | POA: Diagnosis not present

## 2020-08-20 LAB — BASIC METABOLIC PANEL
BUN/Creatinine Ratio: 23 (ref 10–24)
BUN: 31 mg/dL — ABNORMAL HIGH (ref 8–27)
CO2: 21 mmol/L (ref 20–29)
Calcium: 10.2 mg/dL (ref 8.6–10.2)
Chloride: 103 mmol/L (ref 96–106)
Creatinine, Ser: 1.34 mg/dL — ABNORMAL HIGH (ref 0.76–1.27)
Glucose: 254 mg/dL — ABNORMAL HIGH (ref 65–99)
Potassium: 4.5 mmol/L (ref 3.5–5.2)
Sodium: 143 mmol/L (ref 134–144)
eGFR: 57 mL/min/{1.73_m2} — ABNORMAL LOW (ref >59)

## 2020-08-20 MED ORDER — FIASP FLEXTOUCH 100 UNIT/ML ~~LOC~~ SOPN: 10.0000 [IU] | PEN_INJECTOR | Freq: Every day | SUBCUTANEOUS | 0 refills | Status: DC

## 2020-08-20 NOTE — Progress Notes (Signed)
S:     Chief Complaint  Patient presents with  . Medication Management    Diabetes follow up.     Patient arrives in good spirits without assistance.  Presents for diabetes evaluation, education, and management. Patient indicates he checks his blood sugars daily around 11 AM. Yesterday, his blood sugar was 142, but it usually is in the 200s. Denies any 300s or low blood sugar readings. Patient indicates that he has been taking all of his medications, but he only takes 1 tablet twice daily of metformin, not 2 as prescribed. Patient recognizes that he has gained weight since COVID started as he has not been able to go the gym daily like he used to. He does indicate he is interested in losing weight and increasing his physical activity, though he does not state a goal weight. Patient does indicate he has frequent nocturia, often up to 3-4 times per night.   Patient was referred and last seen by Primary Care Provider on 08/14/20.    Patient reports Diabetes was diagnosed about 15 years ago.   Insurance coverage/medication affordability: Medicare  Medication adherence reported to be ok.   Current diabetes medications include: empagliflozin (Jardiance) 25mg , insulin glargine (Basaglar) 30 units once daily, metformin 500mg  1 tablet BID  Current hypertension medications include: amlodipine 10mg , losartan 100mg , chlorthalidone 25mg   Current hyperlipidemia medications include: rosuvastatin 40mg   Patient denies hypoglycemic events.  Patient reported dietary habits: Eats 3 meals/day Breakfast: Half a cup of coffee, dunkin' donut creamer (2 tbsp) Lunch: Tuna sandwich or similar Dinner: Vegetable, potato, pork chop (baked)  Drinks: Water   Patient-reported exercise habits: Walks 4 days a week with dog (1-1.5 miles), has not gone back to the gym since [place]. 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 Plan is to go back to the gym as soon as he feels more comfortable regarding  COVID risk.    Patient endorses nocturia (nighttime urination). (Sometimes 3-4 times per night) Patient denies neuropathy (nerve pain). Patient denies visual changes. Patient reports self foot exams.     O:  Physical Exam Constitutional:      Appearance: He is obese.  Neurological:     Mental Status: He is alert.  Psychiatric:        Mood and Affect: Mood normal.        Behavior: Behavior normal.        Judgment: Judgment normal.    Review of Systems  Genitourinary: Positive for frequency.     Lab Results  Component Value Date   HGBA1C 10.7 (A) 08/14/2020   There were no vitals filed for this visit.  Lipid Panel     Component Value Date/Time   CHOL 176 05/13/2020 1522   TRIG 121 05/13/2020 1522   HDL 56 05/13/2020 1522   CHOLHDL 3.1 05/13/2020 1522   LDLCALC 99 05/13/2020 1522    Home fasting blood sugars: SMBG 142 yesterday morning, usually in the 200s. Denies any in the 300s and the lowest around 130. Patient takes blood sugar around 11 AM every day, but did indicate he often has coffee with creamer early in the morning.    Clinical Atherosclerotic Cardiovascular Disease (ASCVD): Yes  The 10-year ASCVD risk score Mikey Bussing DC Jr., et al., 2013) is: 31%   Values used to calculate the score:     Age: 70 years     Sex: Male     Is Non-Hispanic African American: Yes  Diabetic: Yes     Tobacco smoker: No     Systolic Blood Pressure: 025 mmHg     Is BP treated: Yes     HDL Cholesterol: 56 mg/dL     Total Cholesterol: 176 mg/dL    A/P: Diabetes longstanding currently uncontrolled with symptoms. Patient is able to verbalize appropriate hypoglycemia management plan. Medication adherence appears ok with room for improvement. Control is suboptimal due to medication noncompliance and lifestyle factors. Of note, patient indicated that he did try Trulicity in the past, which caused pancreatitis. Patient denies any side effects with metformin or Jardiance.  - Continue  insulin glargine (Basaglar) 30 units daily  - Start insulin aspart (Fiasp) 10 units daily with your largest meal (most often in the evening per patient) - Start taking metformin 2 tablets twice daily as prescribed - Monitor fasting blood glucose as often as possible as well as pre meal blood glucose at least 2-3 times per week if possible -Extensively discussed pathophysiology of diabetes, recommended lifestyle interventions, dietary effects on blood sugar control -Counseled on s/sx of and management of hypoglycemia - Follow up with Dr. Valentina Lucks on May 19th at 09:00 AM -Next A1C anticipated May 2022.   ASCVD risk - primary prevention in patient with diabetes. Last LDL is not controlled and rosuvastatin was increased at that time. ASCVD risk score is >20%  - high intensity statin indicated. Aspirin is indicated.  -Continued aspirin 81 mg  -Continued rosuvastatin 40 mg.  Repeat direct LDL at next lab assessment.   Hypertension longstanding currently uncontrolled.  Blood pressure goal <130/80 mmHg. Medication adherence appears optimal.  Blood pressure control is suboptimal due to further management of medications. Chlorthalidone was increased to 25 MG at last appointment with PCP.  - Continue current regimen  - Continue to monitor BP, can consider the addition of aldosterone modulator (eplerenone)  if further BP lowering is indicated  Written patient instructions provided.  Total time in face to face counseling 30 minutes.   Follow up Pharmacist Clinic Visit on May 19th, 2022.   Patient seen with Dr. Janeann Forehand, PharmD, Dr. Cephus Slater, PharmD, and Inis Sizer, PharmD Student

## 2020-08-20 NOTE — Assessment & Plan Note (Signed)
Diabetes longstanding currently uncontrolled with symptoms. Patient is able to verbalize appropriate hypoglycemia management plan. Medication adherence appears ok with room for improvement. Control is suboptimal due to medication noncompliance and lifestyle factors. Of note, patient indicated that he did try Trulicity in the past, which caused pancreatitis. Patient denies any side effects with metformin or Jardiance.  - Continue insulin glargine (Basaglar) 30 units daily  - Start insulin aspart (Fiasp) 10 units daily with your largest meal (most often in the evening per patient) - Start taking metformin 2 tablets twice daily as prescribed - Monitor fasting blood glucose as often as possible as well as pre meal blood glucose at least 2-3 times per week if possible -Extensively discussed pathophysiology of diabetes, recommended lifestyle interventions, dietary effects on blood sugar control -Counseled on s/sx of and management of hypoglycemia - Follow up with Dr. Valentina Lucks on May 19th at 09:00 AM -

## 2020-08-20 NOTE — Patient Instructions (Addendum)
It was a pleasure seeing you today. Please continue to take all of your medications. Today, we discussed how to take metformin. Please start taking your metformin 2 tablets twice daily (4 tablets total per day). Today we also want you to start taking a short acting insulin aspart, also called Fiasp. Please inject 10 units right before your largest meal of the day. We would also like you to start testing your blood sugar more often if possible. Please check a fasting blood sugar prior to eating any food at least 3-4 days per week. We would also like you to check your blood sugar after your largest meals a few times per week. Please follow up with Dr. Valentina Lucks in 3 week on May 19th at 09:00 AM.

## 2020-08-20 NOTE — Assessment & Plan Note (Signed)
Hypertension longstanding currently uncontrolled.  Blood pressure goal <130/80 mmHg. Medication adherence appears optimal.  Blood pressure control is suboptimal due to further management of medications. Chlorthalidone was increased to 25 MG at last appointment with PCP.  - Continue current regimen  - Continue to monitor BP, can consider the addition of aldosterone modulator (eplerenone)  if further BP lowering is indicated

## 2020-08-24 NOTE — Progress Notes (Signed)
Reviewed: I agree with Dr. Koval's documentation and management. 

## 2020-08-29 ENCOUNTER — Other Ambulatory Visit: Payer: Self-pay | Admitting: Family Medicine

## 2020-08-29 DIAGNOSIS — I10 Essential (primary) hypertension: Secondary | ICD-10-CM

## 2020-09-03 ENCOUNTER — Telehealth: Payer: Self-pay

## 2020-09-03 MED ORDER — BD PEN NEEDLE NANO 2ND GEN 32G X 4 MM MISC: 1.0000 | Freq: Two times a day (BID) | 2 refills | Status: DC | PRN

## 2020-09-03 NOTE — Telephone Encounter (Signed)
Patient's wife calls nurse line requesting to speak to Dr. Valentina Lucks regarding questions with insulin samples.   Will forward to Dr. Valentina Lucks.   Talbot Grumbling, RN

## 2020-09-03 NOTE — Telephone Encounter (Signed)
Noted and agree. 

## 2020-09-03 NOTE — Telephone Encounter (Signed)
Patient has run out of needle tips.    I agreed to refill supply - sending prescription to his pharmacy.   Patient will pick up and start using today.

## 2020-09-10 ENCOUNTER — Encounter: Payer: Self-pay | Admitting: Pharmacist

## 2020-09-10 ENCOUNTER — Other Ambulatory Visit: Payer: Self-pay

## 2020-09-10 ENCOUNTER — Ambulatory Visit (INDEPENDENT_AMBULATORY_CARE_PROVIDER_SITE_OTHER): Payer: Medicare HMO | Admitting: Pharmacist

## 2020-09-10 VITALS — BP 144/78 | HR 89 | Ht 66.0 in | Wt 195.8 lb

## 2020-09-10 DIAGNOSIS — E7800 Pure hypercholesterolemia, unspecified: Secondary | ICD-10-CM | POA: Diagnosis not present

## 2020-09-10 DIAGNOSIS — E119 Type 2 diabetes mellitus without complications: Secondary | ICD-10-CM | POA: Diagnosis not present

## 2020-09-10 DIAGNOSIS — Z955 Presence of coronary angioplasty implant and graft: Secondary | ICD-10-CM | POA: Diagnosis not present

## 2020-09-10 DIAGNOSIS — I10 Essential (primary) hypertension: Secondary | ICD-10-CM | POA: Diagnosis not present

## 2020-09-10 MED ORDER — CHLORTHALIDONE 25 MG PO TABS: 25.0000 mg | ORAL_TABLET | Freq: Every day | ORAL | 1 refills | Status: DC

## 2020-09-10 MED ORDER — FIASP FLEXTOUCH 100 UNIT/ML ~~LOC~~ SOPN: 10.0000 [IU] | PEN_INJECTOR | Freq: Every day | SUBCUTANEOUS | 1 refills | Status: DC

## 2020-09-10 NOTE — Patient Instructions (Signed)
[name] it was a pleasure seeing you today.   Please do the following:  1. Increase CHLORTHALIDONE TO 25MG  as directed today during your appointment. If you have any questions or if you believe something has occurred because of this change, call me or your doctor to let one of [place] know.  2. Continue checking blood sugars at home. It's really important that you record these and bring these in to your next doctor's appointment.  3. Continue making the lifestyle changes we've discussed together during our visit. Diet and exercise play a significant role in improving your blood sugars.  4. We will follow up with a call with your LDL 5. Follow-up with PCP for dizziness   Hypoglycemia or low blood sugar:   Low blood sugar can happen quickly and may become an emergency if not treated right away.   While this shouldn't happen often, it can be brought upon if you skip a meal or do not eat enough. Also, if your insulin or other diabetes medications are dosed too high, this can cause your blood sugar to go to low.   Warning signs of low blood sugar include: 1. Feeling shaky or dizzy 2. Feeling weak or tired  3. Excessive hunger 4. Feeling anxious or upset  5. Sweating even when you aren't exercising  What to do if I experience low blood sugar? Follow the Rule of 15 1. Check your blood sugar with your meter. If lower than 70, proceed to step 2.  2. Treat with 15 grams of fast acting carbs which is found in 3-4 glucose tablets. If none are available you can try hard candy, 1 tablespoon of sugar or honey,4 ounces of fruit juice, or 6 ounces of REGULAR soda.  3. Re-check your sugar in 15 minutes. If it is still below 70, do what you did in step 2 again. If your blood sugar has come back up, go ahead and eat a snack or small meal made up of complex carbs (ex. Whole grains) and protein at this time to avoid recurrence of low blood sugar.

## 2020-09-10 NOTE — Progress Notes (Signed)
Subjective:    Patient ID: [name], male    DOB: 1951/02/13, 70 y.o.   MRN: [id number]  HPI Patient is a 70 y.o. male who presents for diabetes management. He is in good spirits and presents without assistance. Patient was referred and last seen by Primary Care Provider Dr. Oleh Genin on 08/14/20.  Patient reports taking all of his medications as prescribed, he did run out of pen needles for two days for his rapid acting insulin. He checks his blood pressure daily and it runs SBPs in the 140s. He endorses similar dizziness to previous appointments, despite discontinuing Jardiance. He endorses it occurs when he is laying down, and feels like vertigo. Nocturia has improved to 1-2 times per night.  Insurance coverage/medication affordability: No issues  Medication adherence reported excellent Current diabetes medications include: metformin 1000mg  twice daily, insulin glargine 30 units daily, insulin aspart 10 units once daily with largest meal Current hypertension medications include: losartan 100mg  daily Current hyperlipidemia medications include: rosuvastatin 40mg   Patient denies hypoglycemic events.   Patient reports nocturia (nighttime urination).  Patient denies neuropathy (nerve pain). Patient denies visual changes.  Home fasting blood sugars: 136-150 2 hour post-meal/random blood sugars: 106-180 at night after dinner  Objective:   Labs:   Lab Results  Component Value Date   HGBA1C 10.7 (A) 08/14/2020   HGBA1C 8.2 (A) 05/13/2020   HGBA1C 8.1 (A) 02/05/2019    Vitals:   09/10/20 0903  BP: (!) 144/78  Pulse: 89  SpO2: 98%    No results found for: MICRALBCREAT  Lipid Panel     Component Value Date/Time   CHOL 176 05/13/2020 1522   TRIG 121 05/13/2020 1522   HDL 56 05/13/2020 1522   CHOLHDL 3.1 05/13/2020 1522   [place] 99 05/13/2020 1522    Clinical Atherosclerotic Cardiovascular Disease (ASCVD): Yes  The 10-year ASCVD risk score Mikey Bussing DC Jr., et al., 2013)  is: 37.3%   Values used to calculate the score:     Age: 89 years     Sex: Male     Is Non-Hispanic African American: Yes     Diabetic: Yes     Tobacco smoker: No     Systolic Blood Pressure: 818 mmHg     Is BP treated: Yes     HDL Cholesterol: 56 mg/dL     Total Cholesterol: 176 mg/dL   PHQ-9 Score: did not complete  Assessment/Plan:   Diabetes longstanding currently controlled. Patient is able to verbalize appropriate hypoglycemia management plan. Medication adherence appears excellent, only missed insulin doses when he ran out of pen needles x2 days.  -Continued basal insulin glargine (insulin basaglar). -Continued rapid insulin aspart (insulin fiasp) to 10 units every night before supper.  -Continued metformin 1000mg  twice daily -Extensively discussed pathophysiology of diabetes, recommended lifestyle interventions, dietary effects on blood sugar control -Counseled on s/sx of and management of hypoglycemia -Next A1C anticipated with the next time labs are drawn.   ASCVD risk - secondary prevention in patient with diabetes. Last LDL is not controlled. ASCVD risk score is >20%  - high intensity statin indicated. Aspirin is indicated.  -Continued aspirin 81 mg  -Continued rosuvastatin 40 mg.  -repeat direct LDL today  Hypertension longstanding currently uncontrolled.  Blood pressure goal = <130/80 mmHg. Medication adherence excellent.  Blood pressure control is suboptimal due to needing further medication optimization. . -Increase chlorthalidone to 25mg  daily -Continue losartan 100mg  daily  Written patient instructions provided.  Total time in face to  face counseling 30 minutes.   Follow up PCP Clinic Visit for dizziness.   Patient seen with Marlowe Alt PharmD Candidate, and Norina Buzzard, PharmD - PGY-1 Resident, Angelina Pih, PharmD - PGY-2 Ambulatory Care Resident.

## 2020-09-11 LAB — LDL CHOLESTEROL, DIRECT: LDL Direct: 56 mg/dL (ref 0–99)

## 2020-09-14 ENCOUNTER — Ambulatory Visit: Payer: Medicare HMO | Admitting: Family Medicine

## 2020-09-16 ENCOUNTER — Encounter: Payer: Self-pay | Admitting: Family Medicine

## 2020-09-16 ENCOUNTER — Other Ambulatory Visit: Payer: Self-pay

## 2020-09-16 ENCOUNTER — Ambulatory Visit (INDEPENDENT_AMBULATORY_CARE_PROVIDER_SITE_OTHER): Payer: Medicare HMO | Admitting: Family Medicine

## 2020-09-16 VITALS — BP 156/82 | HR 89 | Ht 66.0 in | Wt 197.6 lb

## 2020-09-16 DIAGNOSIS — R198 Other specified symptoms and signs involving the digestive system and abdomen: Secondary | ICD-10-CM

## 2020-09-16 DIAGNOSIS — R0989 Other specified symptoms and signs involving the circulatory and respiratory systems: Secondary | ICD-10-CM

## 2020-09-16 DIAGNOSIS — H8110 Benign paroxysmal vertigo, unspecified ear: Secondary | ICD-10-CM

## 2020-09-16 DIAGNOSIS — E119 Type 2 diabetes mellitus without complications: Secondary | ICD-10-CM | POA: Diagnosis not present

## 2020-09-16 NOTE — Assessment & Plan Note (Signed)
Vertigo still persistent despite home Epley maneuvers.  Medications for treatment are on the beers list and not recommended in patients age group.  Next step is vestibular rehab. - Referral to physical therapy for vestibular rehab

## 2020-09-16 NOTE — Patient Instructions (Signed)
I am placing referrals to physical therapy for vestibular therapy to help with your dizziness.  Medications that are available to help can be dangerous in your age group so we will prefer not to use those medications.  I am also sending in a referral to gastroenterology to continue the work-up you are having for the difficulty with swallowing.      Benign Positional Vertigo Vertigo is the feeling that you or your surroundings are moving when they are not. Benign positional vertigo is the most common form of vertigo. This is usually a harmless condition (benign). This condition is positional. This means that symptoms are triggered by certain movements and positions. This condition can be dangerous if it occurs while you are doing something that could cause harm to you or others. This includes activities such as driving or operating machinery. What are the causes? The inner ear has fluid-filled canals that help your brain sense movement and balance. When the fluid moves, the brain receives messages about your body's position. With benign positional vertigo, crystals in the inner ear break free and disturb the inner ear area. This causes your brain to receive confusing messages about your body's position. What increases the risk? You are more likely to develop this condition if:  You are a woman.  You are 60 years of age or older.  You have recently had a head injury.  You have an inner ear disease. What are the signs or symptoms? Symptoms of this condition usually happen when you move your head or your eyes in different directions. Symptoms may start suddenly, and usually last for less than a minute. They include:  Loss of balance and falling.  Feeling like you are spinning or moving.  Feeling like your surroundings are spinning or moving.  Nausea and vomiting.  Blurred vision.  Dizziness.  Involuntary eye movement (nystagmus). Symptoms can be mild and cause only minor problems,  or they can be severe and interfere with daily life. Episodes of benign positional vertigo may return (recur) over time. Symptoms may improve over time. How is this diagnosed? This condition may be diagnosed based on:  Your medical history.  Physical exam of the head, neck, and ears.  Positional tests to check for or stimulate vertigo. You may be asked to turn your head and change positions, such as going from sitting to lying down. A health care provider will watch for symptoms of vertigo. You may be referred to a health care provider who specializes in ear, nose, and throat problems (ENT, or otolaryngologist) or a provider who specializes in disorders of the nervous system (neurologist). How is this treated? This condition may be treated in a session in which your health care provider moves your head in specific positions to help the displaced crystals in your inner ear move. Treatment for this condition may take several sessions. Surgery may be needed in severe cases, but this is rare. In some cases, benign positional vertigo may resolve on its own in 2-4 weeks.   Follow these instructions at home: Safety  Move slowly. Avoid sudden body or head movements or certain positions, as told by your health care provider.  Avoid driving until your health care provider says it is safe for you to do so.  Avoid operating heavy machinery until your health care provider says it is safe for you to do so.  Avoid doing any tasks that would be dangerous to you or others if vertigo occurs.  If you have trouble  walking or keeping your balance, try using a cane for stability. If you feel dizzy or unstable, sit down right away.  Return to your normal activities as told by your health care provider. Ask your health care provider what activities are safe for you. General instructions  Take over-the-counter and prescription medicines only as told by your health care provider.  Drink enough fluid to keep  your urine pale yellow.  Keep all follow-up visits as told by your health care provider. This is important. Contact a health care provider if:  You have a fever.  Your condition gets worse or you develop new symptoms.  Your family or friends notice any behavioral changes.  You have nausea or vomiting that gets worse.  You have numbness or a prickling and tingling sensation. Get help right away if you:  Have difficulty speaking or moving.  Are always dizzy.  Faint.  Develop severe headaches.  Have weakness in your legs or arms.  Have changes in your hearing or vision.  Develop a stiff neck.  Develop sensitivity to light. Summary  Vertigo is the feeling that you or your surroundings are moving when they are not. Benign positional vertigo is the most common form of vertigo.  This condition is caused by crystals in the inner ear that become displaced. This causes a disturbance in an area of the inner ear that helps your brain sense movement and balance.  Symptoms include loss of balance and falling, feeling that you or your surroundings are moving, nausea and vomiting, and blurred vision.  This condition can be diagnosed based on symptoms, a physical exam, and positional tests.  Follow safety instructions as told by your health care provider. You will also be told when to contact your health care provider in case of problems. This information is not intended to replace advice given to you by your health care provider. Make sure you discuss any questions you have with your health care provider. Document Revised: 03/05/2019 Document Reviewed: 09/20/2017 Elsevier Patient Education  2021 [employer].    How to Perform the Epley Maneuver The Epley maneuver is an exercise that relieves symptoms of vertigo. Vertigo is the feeling that you or your surroundings are moving when they are not. When you feel vertigo, you may feel like the room is spinning and may have trouble  walking. The Epley maneuver is used for a type of vertigo caused by a calcium deposit in a part of the inner ear. The maneuver involves changing head positions to help the deposit move out of the area. You can do this maneuver at home whenever you have symptoms of vertigo. You can repeat it in 24 hours if your vertigo has not gone away. Even though the Epley maneuver may relieve your vertigo for a few weeks, it is possible that your symptoms will return. This maneuver relieves vertigo, but it does not relieve dizziness. What are the risks? If it is done correctly, the Epley maneuver is considered safe. Sometimes it can lead to dizziness or nausea that goes away after a short time. If you develop other symptoms--such as changes in vision, weakness, or numbness--stop doing the maneuver and call your health care provider. Supplies needed: A bed or table. A pillow. How to do the Epley maneuver Sit on the edge of a bed or table with your back straight and your legs extended or hanging over the edge of the bed or table. Turn your head halfway toward the affected ear or side  as told by your health care provider. Lie backward quickly with your head turned until you are lying flat on your back. You may want to position a pillow under your shoulders. Hold this position for at least 30 seconds. If you feel dizzy or have symptoms of vertigo, continue to hold the position until the symptoms stop. Turn your head to the opposite direction until your unaffected ear is facing the floor. Hold this position for at least 30 seconds. If you feel dizzy or have symptoms of vertigo, continue to hold the position until the symptoms stop. Turn your whole body to the same side as your head so that you are positioned on your side. Your head will now be nearly facedown. Hold for at least 30 seconds. If you feel dizzy or have symptoms of vertigo, continue to hold the position until the symptoms stop. Sit back up. You can repeat  the maneuver in 24 hours if your vertigo does not go away.      Follow these instructions at home: For 24 hours after doing the Epley maneuver: Keep your head in an upright position. When lying down to sleep or rest, keep your head raised (elevated) with two or more pillows. Avoid excessive neck movements. Activity Do not drive or use machinery if you feel dizzy. After doing the Epley maneuver, return to your normal activities as told by your health care provider. Ask your health care provider what activities are safe for you. General instructions Drink enough fluid to keep your urine pale yellow. Do not drink alcohol. Take over-the-counter and prescription medicines only as told by your health care provider. Keep all follow-up visits as told by your health care provider. This is important. Preventing vertigo symptoms Ask your health care provider if there is anything you should do at home to prevent vertigo. He or she may recommend that you: Keep your head elevated with two or more pillows while you sleep. Do not sleep on the side of your affected ear. Get up slowly from bed. Avoid sudden movements during the day. Avoid extreme head positions or movement, such as looking up or bending over. Contact a health care provider if: Your vertigo gets worse. You have other symptoms, including: Nausea. Vomiting. Headache. Get help right away if you: Have vision changes. Have a headache or neck pain that is severe or getting worse. Cannot stop vomiting. Have new numbness or weakness in any part of your body. Summary Vertigo is the feeling that you or your surroundings are moving when they are not. The Epley maneuver is an exercise that relieves symptoms of vertigo. If the Epley maneuver is done correctly, it is considered safe and relieves vertigo quickly. This information is not intended to replace advice given to you by your health care provider. Make sure you discuss any questions you  have with your health care provider. Document Revised: 02/06/2019 Document Reviewed: 02/06/2019 Elsevier Patient Education  2021 [employer].

## 2020-09-16 NOTE — Assessment & Plan Note (Signed)
Next A1c due at the end of July.  Blood sugar this morning was 103, patient without symptoms of hypoglycemia. - Continue current regimen

## 2020-09-16 NOTE — Assessment & Plan Note (Signed)
Patient with persistent globus sensation on the right side. Barium swallow study performed 04/19/2018 with significant GERD, delay in passing about GE junction and mild tertiary contractions.  Patient was unable to follow-up with GI for further as global pandemic occurred during that time period. - Referral to GI placed

## 2020-09-16 NOTE — Progress Notes (Signed)
    SUBJECTIVE:   CHIEF COMPLAINT / HPI:   BPPV  Dizziness Patient reports that his dizziness has still been persistent for the last 3 to 4 weeks.  He reports that after having the Epley maneuver done in the clinic with Dr. Higinio Plan he felt some improvement for several hours, he states that the dizziness came back and he has been attempting to do the maneuver at home but the symptoms have persisted.  He is worried about his balance and some concern about falling as he has felt unstable several times.  Dizziness is worse with movements of looking up and turning to the side quickly  T2DM Patient reports that his blood sugar this morning was 103, he states that he was not having sensations of hypoglycemia and no dizziness.  Globus sensation Patient reports that he has had a history of having his "esophagus stretched" when he was age 70 for a similar sensation in the similar area.  Patient reports that he feels like his pills and food sometimes get caught in his throat on the right side.  It has been very worried about the possibilities and he would like further evaluation.  He states that in 2019 he had initiated evaluation and had an esophageal test but was unable to follow through with further follow-up as COVID hit and everything shut down.  Healthcare maintenance Patient reports he had a colonoscopy completed near his retirement 3 years ago in [place].  PERTINENT  PMH / PSH: Reviewed  OBJECTIVE:   BP (!) 156/82   Pulse 89   Ht 5\' 6"  (1.676 m)   Wt 197 lb 9.6 oz (89.6 kg)   SpO2 100%   BMI 31.89 kg/m   General: Well-appearing, well-nourished, NAD HEENT: NCAT, no cervical lymphadenopathy, no palpable masses of anterior neck Respiratory: Breathing comfortably on room air, no increased work of breathing, able to speak in full sentences Neuro: Alert and oriented, CN II through XII intact, EOMI.  Dix-Hallpike maneuver elicited sensation of vertigo, did not elicit nystagmus during this  visit.  ASSESSMENT/PLAN:   Benign paroxysmal positional vertigo Vertigo still persistent despite home Epley maneuvers.  Medications for treatment are on the beers list and not recommended in patients age group.  Next step is vestibular rehab. - Referral to physical therapy for vestibular rehab  Type 2 diabetes mellitus without complication, without long-term current use of insulin ([place]) Next A1c due at the end of July.  Blood sugar this morning was 103, patient without symptoms of hypoglycemia. - Continue current regimen  Globus sensation Patient with persistent globus sensation on the right side. Barium swallow study performed 04/19/2018 with significant GERD, delay in passing about GE junction and mild tertiary contractions.  Patient was unable to follow-up with GI for further as global pandemic occurred during that time period. - Referral to GI placed     Penney Domanski, [place]

## 2020-10-16 ENCOUNTER — Ambulatory Visit: Payer: Medicare HMO | Attending: Family Medicine

## 2020-10-16 ENCOUNTER — Other Ambulatory Visit: Payer: Self-pay

## 2020-10-16 DIAGNOSIS — H8111 Benign paroxysmal vertigo, right ear: Secondary | ICD-10-CM | POA: Insufficient documentation

## 2020-10-16 DIAGNOSIS — R42 Dizziness and giddiness: Secondary | ICD-10-CM | POA: Diagnosis not present

## 2020-10-16 NOTE — Therapy (Signed)
[facility] [address] [place], [place], [address] Phone: [phone]   Fax:  [phone]  Physical Therapy Evaluation  Patient Details  Name: [name] MRN: [id number] Date of Birth: 01-25-1951 Referring Provider (PT): [name], DO (Resident), Kinnie Feil, MD   Encounter Date: 10/16/2020   PT End of Session - 10/16/20 1525     Visit Number 1    Number of Visits 5    Date for PT Re-Evaluation 11/19/20    Authorization Type [employer] (10th Visit PN)    Authorization Time Period Awaiting Authorization    Progress Note Due on Visit 10    PT Start Time 1448    PT Stop Time 1530    PT Time Calculation (min) 42 min    Activity Tolerance Patient tolerated treatment well    Behavior During Therapy WFL for tasks assessed/performed             Past Medical History:  Diagnosis Date   Diabetes mellitus without complication ([place])    Hypercholesterolemia    Hypertension    MI (myocardial infarction) [facility])     Past Surgical History:  Procedure Laterality Date   CORONARY ANGIOPLASTY WITH STENT PLACEMENT     2004/2005 per patient    There were no vitals filed for this visit.    Subjective Assessment - 10/16/20 1456     Subjective Dizziness started approx 3-4 weeks ago. Patient reports that he woke up one morning and felt the room was spinning. Patient reports that lasts less than a minute. It increases when laying down at night and getting up in the morning. Denies nauseous, vision or hearing changes. Dizziness continues to occur on a daily basis. Feels fine throughout the day, primary occurs with bed mobility. Fast head movements stirs it up at times. Reports he feels stable on his feet, just cautious at first upon standing.    Pertinent History DM, MI, HTN    Limitations House hold activities;Walking    Patient Stated Goals Resolve the Dizziness    Currently in Pain? No/denies                 [facility] PT Assessment - 10/16/20 0001       Assessment   Medical Diagnosis BPPV/Dizziness    Referring Provider (PT) [name], DO (Resident), Kinnie Feil, MD    Onset Date/Surgical Date 09/16/20    Prior Therapy None      Precautions   Precautions None      Balance Screen   Has the patient fallen in the past 6 months No    Has the patient had a decrease in activity level because of a fear of falling?  No    Is the patient reluctant to leave their home because of a fear of falling?  No      Home Environment   Living Environment Private residence    Living Arrangements Spouse/significant other    Available Help at Discharge Family    Type of [place]    Additional Comments denies difficulty getting in/out of home.      Prior Function   Level of Independence Independent    Vocation Retired      Charity fundraiser Status Within Functional Limits for tasks assessed      Observation/Other Assessments   Focus on Therapeutic Outcomes (FOTO)  DPS: 55.4, DFS: 65      Sensation   Light Touch Appears  Intact      ROM / Strength   AROM / PROM / Strength Strength      Strength   Overall Strength Within functional limits for tasks performed      Transfers   Transfers Sit to Stand;Stand to Sit    Sit to Stand 7: Independent    Stand to Sit 7: Independent      Ambulation/Gait   Ambulation/Gait Yes    Ambulation/Gait Assistance 7: Independent    Assistive device None    Gait Pattern Within Functional Limits    Ambulation Surface Level;Indoor                    Vestibular Assessment - 10/16/20 0001       Symptom Behavior   Subjective history of current problem see subjective    Type of Dizziness  Spinning;Vertigo    Frequency of Dizziness daily    Duration of Dizziness seconds to minute    Symptom Nature Positional;Motion provoked    Aggravating Factors Lying supine;Turning head quickly;Rolling to left    Relieving Factors Slow  movements;Rest    Progression of Symptoms No change since onset      Oculomotor Exam   Oculomotor Alignment Normal    Ocular ROM WNL    Spontaneous Absent    Gaze-induced  Absent    Smooth Pursuits Intact    Saccades Intact      Oculomotor Exam-Fixation Suppressed    Left Head Impulse Positive    Right Head Impulse Negative      Vestibulo-Ocular Reflex   VOR 1 Head Only (x 1 viewing) Normal    VOR Cancellation Normal      Positional Testing   Dix-Hallpike Dix-Hallpike Right;Dix-Hallpike Left    Horizontal Canal Testing Horizontal Canal Right;Horizontal Canal Left      Dix-Hallpike Right   Dix-Hallpike Right Duration 10 seconds    Dix-Hallpike Right Symptoms No nystagmus      Dix-Hallpike Left   Dix-Hallpike Left Duration 0    Dix-Hallpike Left Symptoms No nystagmus      Horizontal Canal Right   Horizontal Canal Right Duration 0    Horizontal Canal Right Symptoms Normal      Horizontal Canal Left   Horizontal Canal Left Duration 0    Horizontal Canal Left Symptoms Normal                Objective measurements completed on examination: See above findings.        Vestibular Treatment/Exercise - 10/16/20 0001       Vestibular Treatment/Exercise   Vestibular Treatment Provided Canalith Repositioning    Canalith Repositioning Epley Manuever Right       EPLEY MANUEVER RIGHT   Number of Reps  2    Overall Response Improved Symptoms    Response Details  reduced duration and intensity of nystagmus on reassesment                   PT Education - 10/16/20 1523     Education Details Educated on POC/Evaluation Findings; BPPV    Person(s) Educated Patient    Methods Explanation    Comprehension Verbalized understanding              PT Short Term Goals - 10/16/20 1535       PT SHORT TERM GOAL #1   Title = LTGs               PT Long Term Goals -  10/16/20 1541       PT LONG TERM GOAL #1   Title Patient will be independent with  vestibular HEP (All LTGs Due:    Baseline no HEP established    Time 4    Period Weeks    Status New    Target Date 11/13/20      PT LONG TERM GOAL #2   Title Patient will improve DFS >/= 67, and DPS >/= 60    Baseline DFS: 65, DPS: 55.4    Time 4    Period Weeks    Status New      PT LONG TERM GOAL #3   Title Patient will demonstrate (-) positional testing to indicate resolution of BPPV and allow for improved tolerance for bed mobility    Baseline (+) R Dix - Hallpike    Time 4    Period Weeks    Status New                    Plan - 10/16/20 1538     Clinical Impression Statement Patient is a 70 y.o. male referred to Neuro OPPT for Dizziness/BPPV. Patient's PMH significant for the following: DM, MI, HTN. Patient presents with the following impairments: dizziness, positive HIT on left, and right rotary upbeat nystagmus of short duration with R Dix - Hallpike. Completed CRM x 2 reps with improvements noted. Paitent will benefit from skilled PT services to address impairments and improved tolerance for functional activities such as bed mobility.    Personal Factors and Comorbidities Comorbidity 3+    Comorbidities DM, MI, HTN    Examination-Activity Limitations Bed Mobility    Stability/Clinical Decision Making Stable/Uncomplicated    Clinical Decision Making Low    Rehab Potential Excellent    PT Frequency 1x / week    PT Duration 4 weeks    PT Treatment/Interventions ADLs/Self Care Home Management;Canalith Repostioning;Moist Heat;Cryotherapy;Gait training;Stair training;Functional mobility training;Therapeutic activities;Therapeutic exercise;Balance training;Neuromuscular re-education;Patient/family education;Manual techniques;Dry needling;Vestibular;Passive range of motion    PT Next Visit Plan Reassess R Posterior Canal BPPV and treat as indicated. Assess DVA. Initiate VOR x 1    Consulted and Agree with Plan of Care Patient             Patient will benefit  from skilled therapeutic intervention in order to improve the following deficits and impairments:  Dizziness, Decreased activity tolerance  Visit Diagnosis: Dizziness and giddiness  BPPV (benign paroxysmal positional vertigo), right     Problem List Patient Active Problem List   Diagnosis Date Noted   Benign paroxysmal positional vertigo 09/16/2020   Low-level of literacy 05/21/2018   Globus sensation 03/12/2018   Type 2 diabetes mellitus without complication, without long-term current use of insulin ([place]) 06/09/2017   Pure hypercholesterolemia 06/09/2017   H/O heart artery stent 06/09/2017   Essential hypertension, benign 06/09/2017    Jones Bales, PT, DPT 10/16/2020, 3:47 PM  [facility] [address] [place], [place], [address] Phone: [phone]   Fax:  [phone]  Name: Clois Treanor MRN: [id number] Date of Birth: 1950/06/24

## 2020-10-23 ENCOUNTER — Telehealth: Payer: Self-pay

## 2020-10-23 NOTE — Telephone Encounter (Signed)
Left voice message regarding medication (basaglar) being delivered to office and ready for pickup.

## 2020-10-29 ENCOUNTER — Other Ambulatory Visit: Payer: Self-pay

## 2020-10-29 ENCOUNTER — Ambulatory Visit: Payer: Medicare HMO | Attending: Family Medicine | Admitting: Physical Therapy

## 2020-10-29 DIAGNOSIS — R42 Dizziness and giddiness: Secondary | ICD-10-CM | POA: Insufficient documentation

## 2020-10-29 DIAGNOSIS — H8111 Benign paroxysmal vertigo, right ear: Secondary | ICD-10-CM | POA: Diagnosis not present

## 2020-10-29 NOTE — Patient Instructions (Signed)
Self Treatment for Right Posterior / Anterior Canalithiasis    Sitting on bed: 1. Turn head 45 right. (a) Lie back slowly, shoulders on pillow, head on bed. (b) Hold _20___ seconds. 2. Keeping head on bed, turn head 90 left. Hold __20__ seconds. 3. Roll to left, head on 45 angle down toward bed. Hold __20__ seconds. 4. Sit up on left side of bed. Repeat ___3_ times per session. Do _2___ sessions per day.  Copyright  VHI. All rights reserved.

## 2020-10-30 NOTE — Therapy (Signed)
[facility] [address] [place], [place], [address] Phone: [phone]   Fax:  [phone]  Physical Therapy Treatment  Patient Details  Name: [name] MRN: [id number] Date of Birth: 1950-08-02 Referring Provider (PT): [name], DO (Resident), Kinnie Feil, MD   Encounter Date: 10/29/2020   PT End of Session - 10/30/20 0904     Visit Number 2    Number of Visits 5    Date for PT Re-Evaluation 11/19/20    Authorization Type [employer] (10th Visit PN)    Authorization Time Period Awaiting Authorization    Progress Note Due on Visit 10    PT Start Time 1532    PT Stop Time 1610    PT Time Calculation (min) 38 min    Activity Tolerance Patient tolerated treatment well    Behavior During Therapy WFL for tasks assessed/performed             Past Medical History:  Diagnosis Date   Diabetes mellitus without complication ([place])    Hypercholesterolemia    Hypertension    MI (myocardial infarction) [facility])     Past Surgical History:  Procedure Laterality Date   CORONARY ANGIOPLASTY WITH STENT PLACEMENT     2004/2005 per patient    There were no vitals filed for this visit.   Subjective Assessment - 10/29/20 1534     Subjective Pt states he still feels the dizziness when he lies down on his right side - spinning sensation when he lies down on Rt side and lightheadedness when he sits up    Pertinent History DM, MI, HTN    Limitations House hold activities;Walking    Patient Stated Goals Resolve the Dizziness    Currently in Pain? No/denies                                Vestibular Treatment/Exercise - 10/30/20 0001       Vestibular Treatment/Exercise   Vestibular Treatment Provided Canalith Repositioning    Canalith Repositioning Epley Manuever Right       EPLEY MANUEVER RIGHT   Number of Reps  3    Overall Response Improved Symptoms    Response Details  no  nystagmus and no c/o vertigo on 3rd rep            Neuro Re-ed:  (+) Rt Dix-Hallpike test with Rt rotary upbeating nystagmus and c/o dizziness in test position - indicative of Rt BPPV  SVA - line 8;  DVA - line 6 (WNL's); pt had no c/o incr. Dizziness upon completion of test       PT Education - 10/30/20 0903     Education Details Educated pt on BPPV etiology with article from [place] given; also instructed in Epley for self treatment prn    Person(s) Educated Patient    Methods Explanation;Demonstration;Handout    Comprehension Verbalized understanding;Returned demonstration              PT Short Term Goals - 10/16/20 1535       PT SHORT TERM GOAL #1   Title = LTGs               PT Long Term Goals - 10/30/20 0909       PT LONG TERM GOAL #1   Title Patient will be independent with vestibular HEP (All LTGs Due:    Baseline no HEP established  Time 4    Period Weeks    Status New      PT LONG TERM GOAL #2   Title Patient will improve DFS >/= 67, and DPS >/= 60    Baseline DFS: 65, DPS: 55.4    Time 4    Period Weeks    Status New      PT LONG TERM GOAL #3   Title Patient will demonstrate (-) positional testing to indicate resolution of BPPV and allow for improved tolerance for bed mobility    Baseline (+) R Dix - Hallpike    Time 4    Period Weeks    Status New                   Plan - 10/30/20 0905     Clinical Impression Statement Pt has (+) Rt Dix-Hallpike test with Rt rotary upbeating nystagmus indicative of Rt posterior canalithiasis; Epley was performed for 3 reps and no nystagmus and no c/o vertigo were noted/reported in any position on 3rd rep, indicative of resolution.  Will continue to assess and treat prn.    Personal Factors and Comorbidities Comorbidity 3+    Comorbidities DM, MI, HTN    Examination-Activity Limitations Bed Mobility    Stability/Clinical Decision Making Stable/Uncomplicated    Rehab Potential Excellent     PT Frequency 1x / week    PT Duration 4 weeks    PT Treatment/Interventions ADLs/Self Care Home Management;Canalith Repostioning;Moist Heat;Cryotherapy;Gait training;Stair training;Functional mobility training;Therapeutic activities;Therapeutic exercise;Balance training;Neuromuscular re-education;Patient/family education;Manual techniques;Dry needling;Vestibular;Passive range of motion    PT Next Visit Plan Reassess R Posterior Canal BPPV and treat as indicated. Assess DVA. Initiate VOR x 1    Consulted and Agree with Plan of Care Patient             Patient will benefit from skilled therapeutic intervention in order to improve the following deficits and impairments:  Dizziness, Decreased activity tolerance  Visit Diagnosis: BPPV (benign paroxysmal positional vertigo), right  Dizziness and giddiness     Problem List Patient Active Problem List   Diagnosis Date Noted   Benign paroxysmal positional vertigo 09/16/2020   Low-level of literacy 05/21/2018   Globus sensation 03/12/2018   Type 2 diabetes mellitus without complication, without long-term current use of insulin ([place]) 06/09/2017   Pure hypercholesterolemia 06/09/2017   H/O heart artery stent 06/09/2017   Essential hypertension, benign 06/09/2017    Alda Lea, PT 10/30/2020, 9:10 AM  [facility] [address] [place], [place], [address] Phone: [phone]   Fax:  [phone]  Name: [name] MRN: [id number] Date of Birth: 1950/08/16

## 2020-11-05 ENCOUNTER — Ambulatory Visit: Payer: Medicare HMO

## 2020-11-05 ENCOUNTER — Other Ambulatory Visit: Payer: Self-pay

## 2020-11-05 DIAGNOSIS — R42 Dizziness and giddiness: Secondary | ICD-10-CM

## 2020-11-05 DIAGNOSIS — H8111 Benign paroxysmal vertigo, right ear: Secondary | ICD-10-CM | POA: Diagnosis not present

## 2020-11-05 NOTE — Patient Instructions (Signed)
Gaze Stabilization: Sitting    Keeping eyes on target on wall \\_10N  feet away, tilt head down 15-30 and move head side to side for 60 seconds. Repeat while moving head up and down for 60 seconds. Do 2 sessions per day  Copyright  VHI. All rights reserved.

## 2020-11-05 NOTE — Therapy (Signed)
[facility] [address] [place], [place], [address] Phone: [phone]   Fax:  [phone]  Physical Therapy Treatment  Patient Details  Name: [name] MRN: [id number] Date of Birth: 15-Dec-1950 Referring Provider (PT): [name], DO (Resident), Kinnie Feil, MD   Encounter Date: 11/05/2020   PT End of Session - 11/05/20 1535     Visit Number 3    Number of Visits 5    Date for PT Re-Evaluation 11/19/20    Authorization Type [employer] (10th Visit PN)    Authorization Time Period Awaiting Authorization    Progress Note Due on Visit 10    PT Start Time 1531    PT Stop Time 1554    PT Time Calculation (min) 23 min    Activity Tolerance Patient tolerated treatment well    Behavior During Therapy WFL for tasks assessed/performed             Past Medical History:  Diagnosis Date   Diabetes mellitus without complication ([place])    Hypercholesterolemia    Hypertension    MI (myocardial infarction) [facility])     Past Surgical History:  Procedure Laterality Date   CORONARY ANGIOPLASTY WITH STENT PLACEMENT     2004/2005 per patient    There were no vitals filed for this visit.   Subjective Assessment - 11/05/20 1534     Subjective Patient reports no dizziness with laying back. Reports he still has intermittent lightheadedness. No other new changes.    Pertinent History DM, MI, HTN    Limitations House hold activities;Walking    Patient Stated Goals Resolve the Dizziness    Currently in Pain? No/denies                 Vestibular Assessment - 11/05/20 0001       Positional Testing   Dix-Hallpike Dix-Hallpike Right    Horizontal Canal Testing Horizontal Canal Right;Horizontal Canal Left      Dix-Hallpike Right   Dix-Hallpike Right Duration 0    Dix-Hallpike Right Symptoms No nystagmus      Dix-Hallpike Left   Dix-Hallpike Left Duration 0    Dix-Hallpike Left Symptoms No nystagmus       Horizontal Canal Right   Horizontal Canal Right Duration 0    Horizontal Canal Right Symptoms Normal      Horizontal Canal Left   Horizontal Canal Left Duration 0    Horizontal Canal Left Symptoms Normal              Vestibular Treatment/Exercise - 11/05/20 0001       Vestibular Treatment/Exercise   Vestibular Treatment Provided Gaze    Gaze Exercises X1 Viewing Horizontal;X1 Viewing Vertical      X1 Viewing Horizontal   Foot Position seated    Reps 2    Comments x 30 seconds; x 60 seconds, no dizziness      X1 Viewing Vertical   Foot Position seated    Reps 2    Comments x 30 seconds; x 60 seconds, no dizziness            Gaze Stabilization: Sitting    Keeping eyes on target on wall 3-4 feet away, tilt head down 15-30 and move head side to side for 60 seconds. Repeat while moving head up and down for 60 seconds. Do 2 sessions per day  Copyright  VHI. All rights reserved.       PT Education - 11/05/20 1557  Education Details VOR x 1    Person(s) Educated Patient    Methods Explanation;Demonstration;Handout    Comprehension Verbalized understanding;Returned demonstration              PT Short Term Goals - 10/16/20 1535       PT SHORT TERM GOAL #1   Title = LTGs               PT Long Term Goals - 10/30/20 0909       PT LONG TERM GOAL #1   Title Patient will be independent with vestibular HEP (All LTGs Due:    Baseline no HEP established    Time 4    Period Weeks    Status New      PT LONG TERM GOAL #2   Title Patient will improve DFS >/= 67, and DPS >/= 60    Baseline DFS: 65, DPS: 55.4    Time 4    Period Weeks    Status New      PT LONG TERM GOAL #3   Title Patient will demonstrate (-) positional testing to indicate resolution of BPPV and allow for improved tolerance for bed mobility    Baseline (+) R Dix - Hallpike    Time 4    Period Weeks    Status New                   Plan - 11/05/20 1556      Clinical Impression Statement Completed reassesment of BPPV, with patient demonstrating negative positional testing today for all canals bilaterally. Continued review of R eply for self managment of symptoms. PT also initiated patient on VOR x 1 for 60 seconds with patient tolerating well. No symptoms. Patient would like to keep last schedule appt to ensure no reoccurence of symptoms.    Personal Factors and Comorbidities Comorbidity 3+    Comorbidities DM, MI, HTN    Examination-Activity Limitations Bed Mobility    Stability/Clinical Decision Making Stable/Uncomplicated    Rehab Potential Excellent    PT Frequency 1x / week    PT Duration 4 weeks    PT Treatment/Interventions ADLs/Self Care Home Management;Canalith Repostioning;Moist Heat;Cryotherapy;Gait training;Stair training;Functional mobility training;Therapeutic activities;Therapeutic exercise;Balance training;Neuromuscular re-education;Patient/family education;Manual techniques;Dry needling;Vestibular;Passive range of motion    PT Next Visit Plan Reassess R Posterior Canal BPPV. Review VOR + Progress. D/C    Consulted and Agree with Plan of Care Patient             Patient will benefit from skilled therapeutic intervention in order to improve the following deficits and impairments:  Dizziness, Decreased activity tolerance  Visit Diagnosis: BPPV (benign paroxysmal positional vertigo), right  Dizziness and giddiness     Problem List Patient Active Problem List   Diagnosis Date Noted   Benign paroxysmal positional vertigo 09/16/2020   Low-level of literacy 05/21/2018   Globus sensation 03/12/2018   Type 2 diabetes mellitus without complication, without long-term current use of insulin ([place]) 06/09/2017   Pure hypercholesterolemia 06/09/2017   H/O heart artery stent 06/09/2017   Essential hypertension, benign 06/09/2017    Jones Bales, PT, DPT 11/05/2020, 3:58 PM  [facility] [address] [place], [place], [address] Phone: [phone]   Fax:  [phone]  Name: [name] MRN: [id number] Date of Birth: 1951-01-25

## 2020-11-12 ENCOUNTER — Encounter: Payer: Medicare HMO | Admitting: Physical Therapy

## 2020-11-13 ENCOUNTER — Ambulatory Visit: Payer: Medicare HMO | Admitting: Pharmacist

## 2020-11-19 ENCOUNTER — Ambulatory Visit: Payer: Medicare HMO

## 2021-01-19 NOTE — Telephone Encounter (Signed)
USED LAST 2 BOXES AS SAMPLES. CALLED MULTIPLE TIMES & LEFT VM'S REGARDING PICKUP. HAVENT HEARD BACK. MEDICATION NO LONGER ON AUTO REFILL, PT WILL NEED TO CALL ME OR [facility]

## 2021-01-21 ENCOUNTER — Telehealth: Payer: Self-pay | Admitting: *Deleted

## 2021-01-21 DIAGNOSIS — I10 Essential (primary) hypertension: Secondary | ICD-10-CM

## 2021-01-21 MED ORDER — AMLODIPINE BESYLATE 10 MG PO TABS: 10.0000 mg | ORAL_TABLET | Freq: Every day | ORAL | 0 refills | Status: DC

## 2021-01-21 NOTE — Telephone Encounter (Signed)
Wife called to speak with someone on pharmacy team regarding medication questions.  Will forward to Dr. Valentina Lucks and Dr. Georgina Peer.  Thanks [employer]

## 2021-01-21 NOTE — Addendum Note (Signed)
Addended by: Hughes Better on: 01/21/2021 04:44 PM   Modules accepted: Orders

## 2021-01-21 NOTE — Telephone Encounter (Signed)
Returned call to patient's wife. She was asking if patient was supposed to take losartan alongside amlodipine. Reviewed patient's medication list with wife. Patient's wife stated he needed more amlodipine. Refill sent to pharmacy.

## 2021-03-29 ENCOUNTER — Telehealth: Payer: Self-pay | Admitting: Pharmacist

## 2021-03-29 DIAGNOSIS — I10 Essential (primary) hypertension: Secondary | ICD-10-CM

## 2021-03-29 NOTE — Telephone Encounter (Signed)
Patient's wife called wanting to confirm which medications patient should be taking. Reviewed medication list and wife verified patient has all medications except losartan. Patient told her he has been taking Losartan 100mg  but wife unable to confirm as pharmacy has not dispensed to him since February with a 90 day supply. Patient stated he has in fact been taking consistently but wife verbalized she is not certain. Will route to PCP to determine whether or not she would like to send in refill as patient reported he just ran out. Scheduled appointment with PCP for soonest available, December 20th, as patient has not been seen since May and did not schedule follow-up.

## 2021-03-30 MED ORDER — LOSARTAN POTASSIUM 100 MG PO TABS: ORAL_TABLET | ORAL | 0 refills | Status: DC

## 2021-03-30 NOTE — Telephone Encounter (Signed)
Called patient's wife and she had her husband show her the bottle of Losartan 100mg  that still has pills in it. They also searched the pills in the bottle and it does appear to be Losartan. Blood pressures have had good control with systolics in the 284X.  I will refill the medication at this time.  Discussed with wife to closely monitor his blood pressures and symptoms such as dizziness or unsteadiness on his feet over the next several days/weeks.  We have a follow-up appointment on 12/20 and we will keep this at this time.  We will consider repeating kidney function labs as well.   Dorathea Faerber, DO

## 2021-03-30 NOTE — Addendum Note (Signed)
Addended by: Francene Castle on: 03/30/2021 04:34 PM   Modules accepted: Orders

## 2021-04-13 ENCOUNTER — Ambulatory Visit: Payer: Medicare HMO | Admitting: Family Medicine

## 2021-04-19 ENCOUNTER — Other Ambulatory Visit: Payer: Self-pay | Admitting: Pharmacist

## 2021-04-19 DIAGNOSIS — I10 Essential (primary) hypertension: Secondary | ICD-10-CM

## 2021-05-27 ENCOUNTER — Telehealth: Payer: Self-pay

## 2021-05-27 NOTE — Telephone Encounter (Signed)
Patients wife calls nurse line requesting to speak with pharmacy in regards to medication assistance. [name] has some questions in regards to the renewal process.   Will forward to [place].

## 2021-05-28 NOTE — Telephone Encounter (Signed)
Left message requesting call back  - [phone]

## 2021-05-31 NOTE — Telephone Encounter (Signed)
Spoke with pt's wife about [employer], she will be bringing in paperwork to office.

## 2021-06-10 ENCOUNTER — Other Ambulatory Visit: Payer: Self-pay | Admitting: Family Medicine

## 2021-06-10 DIAGNOSIS — E119 Type 2 diabetes mellitus without complications: Secondary | ICD-10-CM

## 2021-07-05 ENCOUNTER — Other Ambulatory Visit: Payer: Self-pay | Admitting: Family Medicine

## 2021-07-05 DIAGNOSIS — I10 Essential (primary) hypertension: Secondary | ICD-10-CM

## 2021-07-14 NOTE — Progress Notes (Signed)
Received notification from [place] regarding approval for [facility]. Patient assistance approved from 06/29/21 to 04/24/22. ? ?Medication will ship from [place]. Pharmacy added to pt's chart for refills. ? [facility]: [phone] ?[facility]: [phone] ? ?

## 2021-07-15 ENCOUNTER — Other Ambulatory Visit: Payer: Self-pay | Admitting: Family Medicine

## 2021-07-15 DIAGNOSIS — E7800 Pure hypercholesterolemia, unspecified: Secondary | ICD-10-CM

## 2021-07-20 ENCOUNTER — Telehealth: Payer: Self-pay

## 2021-07-20 NOTE — Telephone Encounter (Signed)
Left message with pt's spouse about medication pickup. [facility] shipment (basaglar) is ready & labeled in med room fridge. ?

## 2021-07-21 NOTE — Telephone Encounter (Signed)
Medication given to patient

## 2021-08-20 ENCOUNTER — Other Ambulatory Visit: Payer: Self-pay | Admitting: Family Medicine

## 2021-08-20 DIAGNOSIS — I10 Essential (primary) hypertension: Secondary | ICD-10-CM

## 2021-08-23 ENCOUNTER — Encounter: Payer: Self-pay | Admitting: Family Medicine

## 2021-08-23 ENCOUNTER — Ambulatory Visit (INDEPENDENT_AMBULATORY_CARE_PROVIDER_SITE_OTHER): Payer: Medicare HMO | Admitting: Family Medicine

## 2021-08-23 VITALS — BP 160/87 | HR 87 | Ht 66.0 in | Wt 205.0 lb

## 2021-08-23 DIAGNOSIS — Z1159 Encounter for screening for other viral diseases: Secondary | ICD-10-CM

## 2021-08-23 DIAGNOSIS — E7800 Pure hypercholesterolemia, unspecified: Secondary | ICD-10-CM | POA: Diagnosis not present

## 2021-08-23 DIAGNOSIS — I10 Essential (primary) hypertension: Secondary | ICD-10-CM | POA: Diagnosis not present

## 2021-08-23 DIAGNOSIS — Z1211 Encounter for screening for malignant neoplasm of colon: Secondary | ICD-10-CM | POA: Diagnosis not present

## 2021-08-23 DIAGNOSIS — E119 Type 2 diabetes mellitus without complications: Secondary | ICD-10-CM

## 2021-08-23 LAB — POCT GLYCOSYLATED HEMOGLOBIN (HGB A1C): HbA1c, POC (controlled diabetic range): 9.8 % — AB (ref 0.0–7.0)

## 2021-08-23 MED ORDER — CARVEDILOL 6.25 MG PO TABS: 6.2500 mg | ORAL_TABLET | Freq: Two times a day (BID) | ORAL | 0 refills | Status: DC

## 2021-08-23 MED ORDER — EMPAGLIFLOZIN 10 MG PO TABS: 10.0000 mg | ORAL_TABLET | Freq: Every day | ORAL | 0 refills | Status: DC

## 2021-08-23 NOTE — Progress Notes (Signed)
98%

## 2021-08-23 NOTE — Assessment & Plan Note (Signed)
BP initially significantly elevated to 186/93 > 160/87.  Home measurements with systolic ranges of 200V-794C.  Patient reports compliance with his regimen.  He has never seen a nephrologist.  Consideration to start spironolactone, but will hold off for now as we are starting Jardiance and patient has had issues with increased urination previously so I do not want to complicate the picture by adding spironolactone at this time ?- Continue losartan 100 mg daily ?- Continue chlorthalidone 25 mg daily ?- Continue amlodipine 10 mg daily ?- Start Coreg 6.125 mg twice daily ?- Consideration to start spironolactone if remains uncontrolled. ?- BMP today ?- Urine microalbumin today ?- Referral to nephrology placed ?

## 2021-08-23 NOTE — Assessment & Plan Note (Addendum)
Current A1c 9.8.  Current medications include Basaglar 10 units daily, Fiasp 10 units before supper, metformin 1000 mg twice daily.  Patient is unable to tolerate Trulicity due to a past history of pancreatitis.  Previously trialed Jardiance but patient was having too frequent of urination/nocturia.  Patient is agreeable to trying Jardiance again and closely monitoring his blood sugars. ?- Check fasting CBGs daily ?- Check urine microalbumin ?- Continue metformin 1000 mg twice daily ?- Continue rosuvastatin 40 mg daily ?- Continue Basaglar 30 units daily ?- Continue Fiasp 10 units before supper ?- Start Jardiance 10 mg daily, if patient is unable to tolerate this due to side effects and we will increase the Basaglar to 40 units. ?- Bring records from ophthalmology exam ?- Follow-up in 3 months for A1c ?

## 2021-08-23 NOTE — Assessment & Plan Note (Signed)
Checking lipid panel today. ?- Continue rosuvastatin 40 mg daily ?

## 2021-08-23 NOTE — Progress Notes (Signed)
? ? ?  SUBJECTIVE:  ? ?CHIEF COMPLAINT / HPI:  ? ?Diabetes ?Patient reports that he is checking his blood sugars at home and the average fasting has been in the 200s.  He reports compliance with his medications of metformin 1000 mg twice daily, Basaglar 30 units daily, Fiasp 10 units before supper. ?Patient is due for his ophthalmology exam, they are working to set up the appointment ? ?Hypertension ?Patient reports his blood pressures at home range from the 170Y-174B systolic.  He is compliant with his chlorthalidone 25 mg, losartan 100 mg, amlodipine 10 mg. ? ?Hyperlipidemia ?Patient is also compliant with his rosuvastatin 40 mg daily. ? ?Colonoscopy ?Patient reports that he had a colonoscopy about 5 years ago before he moved down from [place].  He notes that they removed some polyps but is unclear of the exact timeline for follow-up.  He is unsure if it is 10 years or sooner. ? ?PERTINENT  PMH / PSH: Reviewed ? ?OBJECTIVE:  ? ?BP (!) 160/87   Pulse 87   Ht '5\' 6"'$  (1.676 m)   Wt 205 lb (93 kg)   SpO2 100%   BMI 33.09 kg/m?   ?Gen: well-appearing, NAD ?CV: RRR, no m/r/g appreciated, no peripheral edema ?Pulm: CTAB, no wheezes/crackles ?GI: soft, non-tender, non-distended, ventral hernia that is easily reduced ? ?ASSESSMENT/PLAN:  ? ?Type 2 diabetes mellitus without complication, without long-term current use of insulin ([place]) ?Current A1c 9.8.  Current medications include Basaglar 10 units daily, Fiasp 10 units before supper, metformin 1000 mg twice daily.  Patient is unable to tolerate Trulicity due to a past history of pancreatitis.  Previously trialed Jardiance but patient was having too frequent of urination/nocturia.  Patient is agreeable to trying Jardiance again and closely monitoring his blood sugars. ?- Check fasting CBGs daily ?- Check urine microalbumin ?- Continue metformin 1000 mg twice daily ?- Continue rosuvastatin 40 mg daily ?- Continue Basaglar 30 units daily ?- Continue Fiasp 10 units before  supper ?- Start Jardiance 10 mg daily, if patient is unable to tolerate this due to side effects and we will increase the Basaglar to 40 units. ?- Bring records from ophthalmology exam ?- Follow-up in 3 months for A1c ? ?Pure hypercholesterolemia ?Checking lipid panel today. ?- Continue rosuvastatin 40 mg daily ? ?Essential hypertension, benign ?BP initially significantly elevated to 186/93 > 160/87.  Home measurements with systolic ranges of 449Q-759F.  Patient reports compliance with his regimen.  He has never seen a nephrologist.  Consideration to start spironolactone, but will hold off for now as we are starting Jardiance and patient has had issues with increased urination previously so I do not want to complicate the picture by adding spironolactone at this time ?- Continue losartan 100 mg daily ?- Continue chlorthalidone 25 mg daily ?- Continue amlodipine 10 mg daily ?- Start Coreg 6.125 mg twice daily ?- Consideration to start spironolactone if remains uncontrolled. ?- BMP today ?- Urine microalbumin today ?- Referral to nephrology placed ?  ?Healthcare maintenance ?- Patient can obtain Tdap and Shingrix vaccination at pharmacy ?- Ophthalmology exam to be completed in the next month ?- Colonoscopy referral placed ?- Hepatitis C screening ordered ? ?Nicholas Ingwersen, DO ?[place]  ?

## 2021-08-23 NOTE — Patient Instructions (Signed)
It was so great seeing you today! Today we discussed the following: ? ?-Your A1c today was 9.8.  We are going to retry the medication Jardiance, please let me know pretty quickly if this medication is too expensive and let see what we can work out for that.  This medication does have a side effect of increasing the amount of urinations so take it in the morning and let see how you tolerate it ? ?-Your blood pressure is more elevated than I would like, we are going to start a medication called carvedilol (Coreg) that you will take twice a day.  I am also referring you to a nephrologist to make sure your kidney function is doing okay and to help with your blood pressure control. ? ?-I placed a referral to GI for a colonoscopy. ? ?-When you are ready to get your Tdap vaccine (tetanus) you can get this done at your local pharmacy. ? ?-Make sure when you have your eye exam done that the records are either sent to [place] or you bring them into [place]. ? ? ?Please make sure to bring any medications you take to your appointments. If you have any questions or concerns please call the office at [phone].  ? ?

## 2021-08-24 ENCOUNTER — Other Ambulatory Visit (HOSPITAL_COMMUNITY): Payer: Self-pay

## 2021-08-24 ENCOUNTER — Encounter: Payer: Self-pay | Admitting: Family Medicine

## 2021-08-24 LAB — LIPID PANEL
Chol/HDL Ratio: 3.2 ratio (ref 0.0–5.0)
Cholesterol, Total: 243 mg/dL — ABNORMAL HIGH (ref 100–199)
HDL: 77 mg/dL (ref >39)
LDL Chol Calc (NIH): 141 mg/dL — ABNORMAL HIGH (ref 0–99)
Triglycerides: 143 mg/dL (ref 0–149)
VLDL Cholesterol Cal: 25 mg/dL (ref 5–40)

## 2021-08-24 LAB — HCV INTERPRETATION

## 2021-08-24 LAB — BASIC METABOLIC PANEL
BUN/Creatinine Ratio: 16 (ref 10–24)
BUN: 20 mg/dL (ref 8–27)
CO2: 24 mmol/L (ref 20–29)
Calcium: 9.5 mg/dL (ref 8.6–10.2)
Chloride: 105 mmol/L (ref 96–106)
Creatinine, Ser: 1.23 mg/dL (ref 0.76–1.27)
Glucose: 115 mg/dL — ABNORMAL HIGH (ref 70–99)
Potassium: 4.3 mmol/L (ref 3.5–5.2)
Sodium: 144 mmol/L (ref 134–144)
eGFR: 63 mL/min/{1.73_m2} (ref >59)

## 2021-08-24 LAB — MICROALBUMIN / CREATININE URINE RATIO
Creatinine, Urine: 136.4 mg/dL
Microalb/Creat Ratio: 2862 mg/g creat — ABNORMAL HIGH (ref 0–29)
Microalbumin, Urine: 3903.9 ug/mL

## 2021-08-24 LAB — HCV AB W REFLEX TO QUANT PCR: HCV Ab: NONREACTIVE

## 2021-08-27 ENCOUNTER — Encounter: Payer: Self-pay | Admitting: Family Medicine

## 2021-09-10 ENCOUNTER — Other Ambulatory Visit: Payer: Self-pay

## 2021-09-10 MED ORDER — BD PEN NEEDLE NANO 2ND GEN 32G X 4 MM MISC: 1.0000 | Freq: Two times a day (BID) | 2 refills | Status: DC | PRN

## 2021-09-28 ENCOUNTER — Encounter: Payer: Self-pay | Admitting: *Deleted

## 2021-10-07 DIAGNOSIS — H5203 Hypermetropia, bilateral: Secondary | ICD-10-CM | POA: Diagnosis not present

## 2021-10-07 DIAGNOSIS — H524 Presbyopia: Secondary | ICD-10-CM | POA: Diagnosis not present

## 2021-10-07 DIAGNOSIS — H52209 Unspecified astigmatism, unspecified eye: Secondary | ICD-10-CM | POA: Diagnosis not present

## 2021-10-07 DIAGNOSIS — H5211 Myopia, right eye: Secondary | ICD-10-CM | POA: Diagnosis not present

## 2021-10-15 ENCOUNTER — Other Ambulatory Visit: Payer: Self-pay | Admitting: Family Medicine

## 2021-10-15 DIAGNOSIS — E119 Type 2 diabetes mellitus without complications: Secondary | ICD-10-CM

## 2021-10-19 ENCOUNTER — Telehealth: Payer: Self-pay

## 2021-10-19 NOTE — Telephone Encounter (Signed)
Left message informing patient his medication is ready for pickup.   1 box of basaglar is ready and labeled in med room fridge

## 2022-01-13 ENCOUNTER — Telehealth: Payer: Self-pay

## 2022-01-13 NOTE — Telephone Encounter (Signed)
Informed pt his medication is ready for pickup. Pt will come by as soon as possible to pickup.  3 boxes of basaglar pens are labeled and ready in med room fridge.

## 2022-01-17 ENCOUNTER — Other Ambulatory Visit (HOSPITAL_COMMUNITY): Payer: Self-pay

## 2022-01-17 NOTE — Telephone Encounter (Signed)
Patient presents to clinic to pick up medication. 1 box of Basaglar in med fridge. [employer], verified that this is the correct quantity.   Provided to patient as instructed by Rosendo Gros.   Talbot Grumbling, RN

## 2022-01-22 ENCOUNTER — Encounter (HOSPITAL_COMMUNITY): Payer: Self-pay | Admitting: Student

## 2022-01-22 ENCOUNTER — Emergency Department (HOSPITAL_COMMUNITY): Payer: Medicare HMO

## 2022-01-22 ENCOUNTER — Emergency Department (HOSPITAL_BASED_OUTPATIENT_CLINIC_OR_DEPARTMENT_OTHER): Payer: Medicare HMO

## 2022-01-22 ENCOUNTER — Observation Stay (HOSPITAL_COMMUNITY)
Admission: EM | Admit: 2022-01-22 | Discharge: 2022-01-24 | Disposition: A | Discharge status: Home / Self Care | Payer: Medicare HMO | Attending: Family Medicine | Admitting: Family Medicine

## 2022-01-22 ENCOUNTER — Other Ambulatory Visit: Payer: Self-pay

## 2022-01-22 DIAGNOSIS — K4090 Unilateral inguinal hernia, without obstruction or gangrene, not specified as recurrent: Secondary | ICD-10-CM | POA: Diagnosis not present

## 2022-01-22 DIAGNOSIS — R0789 Other chest pain: Secondary | ICD-10-CM | POA: Diagnosis not present

## 2022-01-22 DIAGNOSIS — I358 Other nonrheumatic aortic valve disorders: Secondary | ICD-10-CM | POA: Diagnosis not present

## 2022-01-22 DIAGNOSIS — Z955 Presence of coronary angioplasty implant and graft: Secondary | ICD-10-CM | POA: Diagnosis not present

## 2022-01-22 DIAGNOSIS — M546 Pain in thoracic spine: Secondary | ICD-10-CM

## 2022-01-22 DIAGNOSIS — I2089 Other forms of angina pectoris: Secondary | ICD-10-CM | POA: Diagnosis not present

## 2022-01-22 DIAGNOSIS — Z7982 Long term (current) use of aspirin: Secondary | ICD-10-CM | POA: Diagnosis not present

## 2022-01-22 DIAGNOSIS — I10 Essential (primary) hypertension: Secondary | ICD-10-CM | POA: Insufficient documentation

## 2022-01-22 DIAGNOSIS — N179 Acute kidney failure, unspecified: Secondary | ICD-10-CM

## 2022-01-22 DIAGNOSIS — Z79899 Other long term (current) drug therapy: Secondary | ICD-10-CM | POA: Diagnosis not present

## 2022-01-22 DIAGNOSIS — I771 Stricture of artery: Secondary | ICD-10-CM | POA: Diagnosis not present

## 2022-01-22 DIAGNOSIS — E7800 Pure hypercholesterolemia, unspecified: Secondary | ICD-10-CM

## 2022-01-22 DIAGNOSIS — E785 Hyperlipidemia, unspecified: Secondary | ICD-10-CM

## 2022-01-22 DIAGNOSIS — R609 Edema, unspecified: Secondary | ICD-10-CM | POA: Diagnosis not present

## 2022-01-22 DIAGNOSIS — I429 Cardiomyopathy, unspecified: Secondary | ICD-10-CM | POA: Diagnosis not present

## 2022-01-22 DIAGNOSIS — I25118 Atherosclerotic heart disease of native coronary artery with other forms of angina pectoris: Secondary | ICD-10-CM | POA: Diagnosis not present

## 2022-01-22 DIAGNOSIS — R7989 Other specified abnormal findings of blood chemistry: Secondary | ICD-10-CM

## 2022-01-22 DIAGNOSIS — R079 Chest pain, unspecified: Secondary | ICD-10-CM | POA: Diagnosis not present

## 2022-01-22 DIAGNOSIS — I2510 Atherosclerotic heart disease of native coronary artery without angina pectoris: Secondary | ICD-10-CM | POA: Diagnosis not present

## 2022-01-22 DIAGNOSIS — E119 Type 2 diabetes mellitus without complications: Secondary | ICD-10-CM

## 2022-01-22 DIAGNOSIS — E876 Hypokalemia: Secondary | ICD-10-CM

## 2022-01-22 DIAGNOSIS — I5022 Chronic systolic (congestive) heart failure: Secondary | ICD-10-CM

## 2022-01-22 DIAGNOSIS — M7989 Other specified soft tissue disorders: Secondary | ICD-10-CM

## 2022-01-22 DIAGNOSIS — R778 Other specified abnormalities of plasma proteins: Secondary | ICD-10-CM | POA: Diagnosis not present

## 2022-01-22 DIAGNOSIS — D3500 Benign neoplasm of unspecified adrenal gland: Secondary | ICD-10-CM | POA: Diagnosis not present

## 2022-01-22 DIAGNOSIS — J9811 Atelectasis: Secondary | ICD-10-CM | POA: Diagnosis not present

## 2022-01-22 DIAGNOSIS — Z87891 Personal history of nicotine dependence: Secondary | ICD-10-CM | POA: Diagnosis not present

## 2022-01-22 DIAGNOSIS — M79609 Pain in unspecified limb: Secondary | ICD-10-CM

## 2022-01-22 DIAGNOSIS — R0602 Shortness of breath: Secondary | ICD-10-CM | POA: Diagnosis not present

## 2022-01-22 LAB — CBG MONITORING, ED: Glucose-Capillary: 188 mg/dL — ABNORMAL HIGH (ref 70–99)

## 2022-01-22 LAB — CBC WITH DIFFERENTIAL/PLATELET
Abs Immature Granulocytes: 0.03 10*3/uL (ref 0.00–0.07)
Basophils Absolute: 0.1 10*3/uL (ref 0.0–0.1)
Basophils Relative: 1 %
Eosinophils Absolute: 0.2 10*3/uL (ref 0.0–0.5)
Eosinophils Relative: 2 %
HCT: 44 % (ref 39.0–52.0)
Hemoglobin: 14.2 g/dL (ref 13.0–17.0)
Immature Granulocytes: 0 %
Lymphocytes Relative: 24 %
Lymphs Abs: 2.3 10*3/uL (ref 0.7–4.0)
MCH: 28.9 pg (ref 26.0–34.0)
MCHC: 32.3 g/dL (ref 30.0–36.0)
MCV: 89.6 fL (ref 80.0–100.0)
Monocytes Absolute: 0.6 10*3/uL (ref 0.1–1.0)
Monocytes Relative: 6 %
Neutro Abs: 6.4 10*3/uL (ref 1.7–7.7)
Neutrophils Relative %: 67 %
Platelets: 294 10*3/uL (ref 150–400)
RBC: 4.91 MIL/uL (ref 4.22–5.81)
RDW: 12.5 % (ref 11.5–15.5)
WBC: 9.5 10*3/uL (ref 4.0–10.5)
nRBC: 0 % (ref 0.0–0.2)

## 2022-01-22 LAB — COMPREHENSIVE METABOLIC PANEL
ALT: 20 U/L (ref 0–44)
AST: 26 U/L (ref 15–41)
Albumin: 3.5 g/dL (ref 3.5–5.0)
Alkaline Phosphatase: 53 U/L (ref 38–126)
Anion gap: 9 (ref 5–15)
BUN: 18 mg/dL (ref 8–23)
CO2: 25 mmol/L (ref 22–32)
Calcium: 9.1 mg/dL (ref 8.9–10.3)
Chloride: 105 mmol/L (ref 98–111)
Creatinine, Ser: 1.29 mg/dL — ABNORMAL HIGH (ref 0.61–1.24)
GFR, Estimated: 59 mL/min — ABNORMAL LOW (ref >60)
Glucose, Bld: 362 mg/dL — ABNORMAL HIGH (ref 70–99)
Potassium: 4.6 mmol/L (ref 3.5–5.1)
Sodium: 139 mmol/L (ref 135–145)
Total Bilirubin: 0.3 mg/dL (ref 0.3–1.2)
Total Protein: 6.9 g/dL (ref 6.5–8.1)

## 2022-01-22 LAB — TROPONIN I (HIGH SENSITIVITY)
Troponin I (High Sensitivity): 114 ng/L (ref <18)
Troponin I (High Sensitivity): 121 ng/L (ref <18)

## 2022-01-22 LAB — D-DIMER, QUANTITATIVE: D-Dimer, Quant: 0.66 ug/mL-FEU — ABNORMAL HIGH (ref 0.00–0.50)

## 2022-01-22 LAB — MAGNESIUM: Magnesium: 2.4 mg/dL (ref 1.7–2.4)

## 2022-01-22 LAB — BRAIN NATRIURETIC PEPTIDE: B Natriuretic Peptide: 91.7 pg/mL (ref 0.0–100.0)

## 2022-01-22 MED ORDER — ENOXAPARIN SODIUM 40 MG/0.4ML IJ SOSY
40.0000 mg | PREFILLED_SYRINGE | INTRAMUSCULAR | Status: DC
  Administered 2022-01-23 – 2022-01-24 (×2): 40 mg via SUBCUTANEOUS
  Filled 2022-01-22 (×2): qty 0.4

## 2022-01-22 MED ORDER — ASPIRIN 81 MG PO CHEW
324.0000 mg | CHEWABLE_TABLET | Freq: Once | ORAL | Status: AC
  Administered 2022-01-22: 324 mg via ORAL
  Filled 2022-01-22: qty 4

## 2022-01-22 MED ORDER — ASPIRIN 81 MG PO TBEC: 81.0000 mg | DELAYED_RELEASE_TABLET | Freq: Every day | ORAL | Status: DC

## 2022-01-22 MED ORDER — IOHEXOL 350 MG/ML SOLN
100.0000 mL | Freq: Once | INTRAVENOUS | Status: AC | PRN
  Administered 2022-01-22: 100 mL via INTRAVENOUS

## 2022-01-22 MED ORDER — CARVEDILOL 3.125 MG PO TABS
6.2500 mg | ORAL_TABLET | Freq: Two times a day (BID) | ORAL | Status: DC
  Administered 2022-01-23: 6.25 mg via ORAL
  Filled 2022-01-22: qty 2

## 2022-01-22 MED ORDER — INSULIN GLARGINE-YFGN 100 UNIT/ML ~~LOC~~ SOLN
10.0000 [IU] | Freq: Every day | SUBCUTANEOUS | Status: DC
  Administered 2022-01-22 – 2022-01-24 (×3): 10 [IU] via SUBCUTANEOUS
  Filled 2022-01-22 (×3): qty 0.1

## 2022-01-22 MED ORDER — LOSARTAN POTASSIUM 50 MG PO TABS
100.0000 mg | ORAL_TABLET | Freq: Every day | ORAL | Status: DC
  Administered 2022-01-22 – 2022-01-23 (×2): 100 mg via ORAL
  Filled 2022-01-22 (×2): qty 2

## 2022-01-22 MED ORDER — FENTANYL CITRATE PF 50 MCG/ML IJ SOSY
50.0000 ug | PREFILLED_SYRINGE | Freq: Once | INTRAMUSCULAR | Status: AC
  Administered 2022-01-22: 50 ug via INTRAVENOUS
  Filled 2022-01-22: qty 1

## 2022-01-22 MED ORDER — EMPAGLIFLOZIN 10 MG PO TABS
10.0000 mg | ORAL_TABLET | Freq: Every day | ORAL | Status: DC
  Administered 2022-01-22 – 2022-01-24 (×3): 10 mg via ORAL
  Filled 2022-01-22 (×3): qty 1

## 2022-01-22 MED ORDER — ROSUVASTATIN CALCIUM 20 MG PO TABS
40.0000 mg | ORAL_TABLET | Freq: Every day | ORAL | Status: DC
  Administered 2022-01-22 – 2022-01-24 (×3): 40 mg via ORAL
  Filled 2022-01-22 (×4): qty 2

## 2022-01-22 MED ORDER — CHLORTHALIDONE 25 MG PO TABS
25.0000 mg | ORAL_TABLET | Freq: Every day | ORAL | Status: DC
  Administered 2022-01-23: 25 mg via ORAL
  Filled 2022-01-22 (×3): qty 1

## 2022-01-22 MED ORDER — INSULIN ASPART 100 UNIT/ML IJ SOLN
0.0000 [IU] | Freq: Three times a day (TID) | INTRAMUSCULAR | Status: DC
  Administered 2022-01-23: 5 [IU] via SUBCUTANEOUS
  Administered 2022-01-23: 8 [IU] via SUBCUTANEOUS
  Administered 2022-01-23: 3 [IU] via SUBCUTANEOUS
  Administered 2022-01-24: 11 [IU] via SUBCUTANEOUS
  Administered 2022-01-24: 3 [IU] via SUBCUTANEOUS

## 2022-01-22 MED ORDER — AMLODIPINE BESYLATE 10 MG PO TABS
10.0000 mg | ORAL_TABLET | Freq: Every day | ORAL | Status: DC
  Administered 2022-01-22 – 2022-01-24 (×3): 10 mg via ORAL
  Filled 2022-01-22: qty 2
  Filled 2022-01-22: qty 1
  Filled 2022-01-22: qty 2

## 2022-01-22 NOTE — ED Provider Triage Note (Signed)
Emergency Medicine Provider Triage Evaluation Note  Uziah Sorter , a 71 y.o. male  was evaluated in triage.  Pt complains of chest pain for the past 2 to 3 weeks.  This is intermittent.  Initially he thought he pulled a muscle.  Has been taking Tylenol without relief.  It is reproducible with certain range of motion.  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  He is unsure if he has any stents placed.  Also endorses exertional dyspnea.  Has trace pitting edema bilateral lower extremities.  Denies orthopnea or PND.  Review of Systems  Positive: As above Negative: As above  Physical Exam  BP (!) 204/99 (BP Location: Right Arm)   Pulse 83   Temp 98.1 F (36.7 C) (Oral)   Resp 17   SpO2 96%  Gen:   Awake, no distress   Resp:  Normal effort  MSK:   Moves extremities without difficulty  Other:    Medical Decision Making  Medically screening exam initiated at 3:49 PM.  Appropriate orders placed.  Tyric Rodeheaver was informed that the remainder of the evaluation will be completed by another provider, this initial triage assessment does not replace that evaluation, and the importance of remaining in the ED until their evaluation is complete.     Evlyn Courier, PA-C 01/22/22 1550

## 2022-01-22 NOTE — H&P (Addendum)
Hospital Admission History and Physical Service Pager: [phone]  Patient name: [name] Medical record number: [id number] Date of Birth: 05/26/1950 Age: 71 y.o. Gender: male  Primary Care Provider: [name], DO Consultants: Cardiology Code Status: Full Preferred Emergency Contact:  Contact Information     Name Relation Home Work [place], [place] Spouse   [phone]        Chief Complaint: Back pain  Assessment and Plan: [name] is a 71 y.o. male presenting with left upper back pain of 3 weeks with concern for atypical angina. Differential for this patient's presentation of this includes ACS, aortic dissection, MSK back pain, pancreatitis, GERD, fracture. 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  Generally compliant with home medications for blood pressure control and aspirin for prior stent placement.  Imaging thus far unremarkable with the exception of cardiomegaly on CXR.  Labs generally within normal limits with exception of hyperglycemia.  Suspect his presenting symptom of left upper back pain is related to mowing lawns and musculoskeletal in nature.  Overall well-appearing, will admit as requested by cardiology and pending recommendations for them.  Hypertensive in ranges of 150s-200s/80s-100s although has not taken medications today. If not requiring further cardiac work-up, likely discharge within the next 1 to 2 days with outpatient follow-up. -Admit to [place], attending Dr. Thompson Grayer, med telemetry -Cardiology consulted by ED provider, appreciate recommendations -Continuous cardiac monitoring x24 hours -Vital signs per unit, with pulse ox -Continue home hypertensive medications: Amlodipine 10 mg daily, Coreg 6.25 mg twice daily, chlorthalidone 25 mg daily, losartan 100 mg daily -Aspirin 81 mg to start  tomorrow night, s/p ASA '324mg'$  during ED admission -A.m. BMP, lipid panel  Type 2 diabetes mellitus without complication, without long-term current use of insulin ([place]) Presents hyperglycemic to 362.  Previous A1c on 08/23/2021 was 9.8.  Home medications include metformin 1000 mg twice daily, Jardiance 10 mg daily, glargine 30 units daily, aspart 10 units before dinner.  Compliance at home is different with metformin 500 mg daily and glargine 10 units daily. Likely will need adjusted insulin regimen prior to going home -A1c in a.m. -Semglee 10 units, moderate SSI -CBG monitoring 4 times daily, before meals and at bedtime   Chronic conditions HLD - continue crestor  FEN/GI: N.p.o. VTE Prophylaxis: Lovenox subcu to start tomorrow a.m.  Disposition: Med telemetry  History of Present Illness:  [name] is a 72 y.o. male presenting with left upper back pain. States he has been having left upper back pain since 3 weeks ago. Thought he had a pulled muscle. Tylenol and heat pack not beneficial. Pain went up to his neck. Pain is always present, comes around his chest. Describes it as stabbing pain, progressed from 5/10>9/10. He came to the ED because he got scared that it's been going on for so long. Denies fever, nausea/vomiting/diarrhea, chest pain. Endorses SOB, and it is difficult to take a breath in because that is when he gets pain. States the pain is constant. Medications during ED visit helped.  He mows lawns and states he has been getting out of breath easier while doing that. He's able to get his breath back after 5-10 min.  Denies any recent traumas or injuries.  In the ED, presented with left back pain ongoing for 3 weeks which wraps around to left  chest and radiates up into back of his head that is worsened with deep breaths and certain movements.  Received aspirin 324 mg, fentanyl 50 mcg.  Cardiology consulted by ED provider.  Upon discussion with admitting doctor, cardiology to see but was  unsure if this required admission for cardiac etiology.  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   Pertinent Past Surgical History: Proximal LAD stent (08/01/2003) Remainder reviewed in history tab.  Pertinent Social History: Tobacco use: Former - quit 40-50 years ago Alcohol use: none Other Substance use: never Lives with his wife  Pertinent Family History: Father: died of heart attack at age of 29 Mother: died of heart attack in late 50s  Remainder reviewed in history tab.   Important Outpatient Medications: HTN: Losartan 100 mg daily, chlorthalidone 25 mg daily, Coreg 6.25 mg twice daily, amlodipine 10 mg daily T2DM: Metformin 1000 mg twice daily, Jardiance 10 mg daily, glargine 30 units daily, aspart 10u before dinner; **takes metformin '500mg'$  daily, glargine 10u daily HLD: Crestor 40 mg daily History of MI: Aspirin 81 mg daily Remainder reviewed in medication history.   Objective: BP (!) 190/109   Pulse 77   Temp 98.1 F (36.7 C) (Oral)   Resp (!) 24   SpO2 98%  Exam: General: Awake, alert and appropriately responsive in NAD HEENT: NCAT. EOMI, PERRL.   Neck: Supple, normal ROM Chest: CTAB, normal WOB. Good air movement bilaterally.   Heart: RRR, normal S1, S2. No murmur appreciated, no carotid bruit, 2+ radial pulse Abdomen: Soft, non-tender, non-distended. Normoactive bowel sounds.  Reducible ventral hernia Extremities: Extremities WWP. Moves all extremities equally.  +1 lower extremity pitting edema MSK: Normal bulk and tone, chest nontender to palpation, left upper paraspinal hypertonicity along region of T3-T6, no pinpoint spinal tenderness Neuro: Appropriately responsive to stimuli. No gross deficits appreciated.  Skin: No rashes or lesions appreciated.   Labs:  CBC BMET  Recent Labs  Lab 01/22/22 1556  WBC 9.5  HGB 14.2  HCT 44.0  PLT 294   Recent  Labs  Lab 01/22/22 1556  NA 139  K 4.6  CL 105  CO2 25  BUN 18  CREATININE 1.29*  GLUCOSE 362*  CALCIUM 9.1    Troponin: 114> 121 BNP: 91.7 WNL Magnesium: 2.4 WNL D-dimer 0.66 elevated  EKG: NSR, leftward axis, normal PR interval, ST elevation in V1 but not meeting criteria of elevation >0.33m in older male diagnostic for STEMI, no signs of QT prolongation  Imaging Studies Performed: CTA chest/abdomen/pelvis: No evidence of acute aortic injury, no suspicious etiology for abdominal pain Vascular ultrasound LE: No sign of DVT bilaterally CXR: Impression from Radiologist: No active cardiopulmonary disease My Interpretation: enlarged heart, no sign of PTX, pleural effusions  DWells Guiles DO 01/22/2022, 10:10 PM PGY-2, [place] pager: [phone] text pages welcome Secure chat group [facility]

## 2022-01-22 NOTE — Assessment & Plan Note (Addendum)
Presented with atypical angina with flat troponins but concerning history of dyspnea on exertion.  Suspect there is a component of MSK pain, but patient does have a history of CAD s/p PCI in 2005.  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  Imaging has otherwise been unremarkable.  Has been hypertensive during this admission but had not taken home meds.  Lipid panel overall unremarkable, TSH WNL.  -Cardiology following, appreciate recommendations -Continuous cardiac monitoring x24 hours -Vital signs per unit, with pulse ox -Home ASA and Crestor

## 2022-01-22 NOTE — ED Provider Notes (Signed)
[facility] EMERGENCY DEPARTMENT Provider Note   CSN: [id number] Arrival date & time: 01/22/22  1350     History  Chief Complaint  Patient presents with   Chest Pain   Shortness of Breath    [name] is a 71 y.o. male.  HPI 71 year old male presents with a chief complaint of left back pain.  It has been ongoing for about 3 weeks.  Wraps around to his left chest but also radiates up into the back of his head.  It is worse with deep breaths but also worse with certain movements and laying on his left side.  No leg swelling per patient though in triage it was noted that his left leg seems bigger than his right.  He had a prior history of a heart attack but states it did not feel like this.  Home Medications Prior to Admission medications   Medication Sig Start Date End Date Taking? Authorizing Provider  Accu-Chek Softclix Lancets lancets USE AS DIRECTED UP TO FOUR TIMES DAILY 06/10/19  Yes Enid Derry, [place], DO  amLODipine (NORVASC) 10 MG tablet TAKE 1 TABLET(10 MG) BY MOUTH AT BEDTIME 04/28/21  Yes Lilland, Alana, DO  aspirin EC 81 MG tablet Take 81 mg by mouth daily.   Yes [provider]  blood glucose meter kit and supplies Dispense based on patient and insurance preference. Use up to four times daily as directed. (FOR ICD-10 E10.9, E11.9). 05/13/20  Yes Lilland, Alana, DO  carvedilol (COREG) 6.25 MG tablet Take 1 tablet (6.25 mg total) by mouth 2 (two) times daily with a meal. 08/23/21  Yes Lilland, Alana, DO  chlorthalidone (HYGROTON) 25 MG tablet TAKE 1 TABLET(25 MG) BY MOUTH DAILY 08/20/21  Yes Lilland, Alana, DO  empagliflozin (JARDIANCE) 10 MG TABS tablet Take 1 tablet (10 mg total) by mouth daily. 08/23/21  Yes Lilland, Alana, DO  FIASP FLEXTOUCH 100 UNIT/ML FlexTouch Pen INJECT 10 UNITS INTO THE SKIN DAILY BEFORE SUPPER 10/15/21  Yes Lilland, Alana, DO  glucose blood (ACCU-CHEK GUIDE) test strip Use as instructed 05/21/20  Yes Hensel, Jamal Collin, MD  Insulin  Glargine (BASAGLAR KWIKPEN) 100 UNIT/ML SOPN Inject 0.3 mLs (30 Units total) into the skin daily. Patient taking differently: Inject 10 Units into the skin daily. 11/15/18  Yes Hensel, Jamal Collin, MD  Insulin Pen Needle (BD PEN NEEDLE NANO 2ND GEN) 32G X 4 MM MISC Inject 1 Container into the skin 3 times/day as needed-between meals & bedtime. USE AS DIRECTED DAILY 09/10/21  Yes Lilland, Alana, DO  losartan (COZAAR) 100 MG tablet TAKE 1 TABLET(100 MG) BY MOUTH DAILY 07/05/21  Yes Lilland, Alana, DO  metFORMIN (GLUCOPHAGE) 500 MG tablet TAKE 2 TABLETS(1000 MG) BY MOUTH TWICE DAILY WITH A MEAL Patient taking differently: Take 500 mg by mouth at bedtime. 06/10/21  Yes Lilland, Alana, DO  rosuvastatin (CRESTOR) 40 MG tablet TAKE 1 TABLET(40 MG) BY MOUTH DAILY 07/16/21  Yes Lilland, Alana, DO      Allergies    Trulicity [dulaglutide]    Review of Systems   Review of Systems  Cardiovascular:  Positive for chest pain. Negative for leg swelling.  Gastrointestinal:  Negative for abdominal pain.  Musculoskeletal:  Positive for back pain and neck pain.  Neurological:  Positive for headaches.    Physical Exam Updated Vital Signs BP (!) 190/109   Pulse 77   Temp 98.1 F (36.7 C) (Oral)   Resp (!) 24   SpO2 98%  Physical Exam Vitals and nursing  note reviewed.  Constitutional:      Appearance: He is well-developed.  HENT:     Head: Normocephalic and atraumatic.  Cardiovascular:     Rate and Rhythm: Normal rate and regular rhythm.     Heart sounds: Normal heart sounds.  Pulmonary:     Effort: Pulmonary effort is normal.     Breath sounds: Normal breath sounds.  Chest:     Chest wall: Tenderness (mild, left breast) present.  Abdominal:     Palpations: Abdomen is soft.     Tenderness: There is no abdominal tenderness.  Musculoskeletal:       Back:     Comments: His left calf is perhaps slightly larger than his right but it is subtle if present.  Skin:    General: Skin is warm and dry.   Neurological:     Mental Status: He is alert.     ED Results / Procedures / Treatments   Labs (all labs ordered are listed, but only abnormal results are displayed) Labs Reviewed  COMPREHENSIVE METABOLIC PANEL - Abnormal; Notable for the following components:      Result Value   Glucose, Bld 362 (*)    Creatinine, Ser 1.29 (*)    GFR, Estimated 59 (*)    All other components within normal limits  D-DIMER, QUANTITATIVE - Abnormal; Notable for the following components:   D-Dimer, Quant 0.66 (*)    All other components within normal limits  CBG MONITORING, ED - Abnormal; Notable for the following components:   Glucose-Capillary 188 (*)    All other components within normal limits  TROPONIN I (HIGH SENSITIVITY) - Abnormal; Notable for the following components:   Troponin I (High Sensitivity) 114 (*)    All other components within normal limits  TROPONIN I (HIGH SENSITIVITY) - Abnormal; Notable for the following components:   Troponin I (High Sensitivity) 121 (*)    All other components within normal limits  CBC WITH DIFFERENTIAL/PLATELET  MAGNESIUM  BRAIN NATRIURETIC PEPTIDE  BASIC METABOLIC PANEL  HEMOGLOBIN A1C  LIPID PANEL    EKG EKG Interpretation  Date/Time:  Saturday January 22 2022 15:40:36 EDT Ventricular Rate:  83 PR Interval:  164 QRS Duration: 130 QT Interval:  384 QTC Calculation: 451 R Axis:   -29 Text Interpretation: Normal sinus rhythm Non-specific intra-ventricular conduction block Minimal voltage criteria for LVH, may be normal variant ( Cornell product ) Cannot rule out Anterior infarct , age undetermined T wave abnormality, consider lateral ischemia Abnormal ECG When compared with ECG of 15-Aug-2020 11:20, lateral st changes more pronounced Confirmed by Dorie Rank [phone]) on 01/22/2022 3:44:40 PM  Radiology CT Angio Chest/Abd/Pel for Dissection W and/or Wo Contrast  Result Date: 01/22/2022 CLINICAL DATA:  Acute aortic syndrome (AAS) suspected EXAM:  CT ANGIOGRAPHY CHEST, ABDOMEN AND PELVIS TECHNIQUE: Non-contrast CT of the chest was initially obtained. Multidetector CT imaging through the chest, abdomen and pelvis was performed using the standard protocol during bolus administration of intravenous contrast. Multiplanar reconstructed images and MIPs were obtained and reviewed to evaluate the vascular anatomy. RADIATION DOSE REDUCTION: This exam was performed according to the departmental dose-optimization program which includes automated exposure control, adjustment of the mA and/or kV according to patient size and/or use of iterative reconstruction technique. CONTRAST:  154m OMNIPAQUE IOHEXOL 350 MG/ML SOLN COMPARISON:  April 12, 2020. FINDINGS: CTA CHEST FINDINGS Cardiovascular: Preferential opacification of the thoracic aorta. No evidence of thoracic aortic aneurysm, intramural hematoma or dissection. Normal heart size. No pericardial effusion. Three-vessel  coronary artery atherosclerotic calcifications. Aortic valve calcifications. Mediastinum/Nodes: No axillary or mediastinal adenopathy. Visualized thyroid is unremarkable. Lungs/Pleura: No pleural effusion or pneumothorax. No suspicious pulmonary masses or nodules. Scattered atelectasis. Musculoskeletal: No chest wall abnormality. No acute or significant osseous findings. Review of the MIP images confirms the above findings. CTA ABDOMEN AND PELVIS FINDINGS VASCULAR Aorta: Mild atherosclerotic calcifications throughout the abdominal aorta. No aneurysm. No evidence of acute dissection. Celiac: Patent without evidence of aneurysm, dissection, vasculitis or significant stenosis. SMA: Patent without evidence of aneurysm, dissection, vasculitis or significant stenosis. Renals: Both renal arteries are patent without evidence of aneurysm, dissection, vasculitis, fibromuscular dysplasia or significant stenosis. IMA: Patent without evidence of aneurysm, dissection, vasculitis or significant stenosis. Inflow: Soft  and calcified plaque of the LEFT common iliac results in mild stenosis. 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 There is near complete stenosis of the RIGHT internal iliac artery due saccular dilation of a combination of soft and calcified plaque. Bilateral external iliacs and downstream are patent without stenosis. Veins: No obvious venous abnormality within the limitations of this arterial phase study. Review of the MIP images confirms the above findings. NON-VASCULAR Hepatobiliary: Unremarkable appearance of the liver and gallbladder for phase of contrast. No extrahepatic biliary ductal dilation. Pancreas: Unremarkable appearance of the pancreas for phase of contrast. No peripancreatic fat stranding. Spleen: Unremarkable for phase of contrast. Adrenals/Urinary Tract: Stable appearance of a 12 mm benign LEFT adrenal adenoma (for which no dedicated imaging follow-up is recommended). RIGHT adrenal gland is unremarkable. Kidneys enhance symmetrically. No hydronephrosis. No obstructing nephrolithiasis. Bladder is unremarkable. Stomach/Bowel: No evidence of bowel obstruction. Moderate colonic stool burden predominately within the RIGHT hemicolon. Appendix is normal. Stomach is decompressed. Lymphatic: No suspicious lymphadenopathy. Reproductive: Prostate is present. Other: Fat containing RIGHT inguinal hernia. Musculoskeletal: No acute osseous abnormality. Review of the MIP images confirms the above findings. IMPRESSION: 1. No evidence of acute aortic injury in the chest, abdomen or pelvis. 2. No suspicious CT etiology for abdominal pain is identified. Aortic Atherosclerosis (ICD10-I70.0). Electronically Signed   By: Valentino Saxon M.D.   On: 01/22/2022 19:29   VAS [place] LOWER EXTREMITY VENOUS (DVT) (ONLY [facility] & [facility])  Result Date: 01/22/2022  Lower Venous DVT Study Patient Name:  PHI AVANS  Date of Exam:   01/22/2022 Medical Rec #: [id number]   Accession #:    [id number] Date  of Birth: May 18, 1950    Patient Gender: M Patient Age:   27 years Exam Location:  [facility] Procedure:      VAS [place] LOWER EXTREMITY VENOUS (DVT) Referring Phys: Nicki Reaper [name] --------------------------------------------------------------------------------  Indications: Swelling, and Left shoulder pain.  Limitations: Positioning and clothing. Comparison Study: No prior study Performing Technologist: Sharion Dove RVS  Examination Guidelines: A complete evaluation includes B-mode imaging, spectral Doppler, color Doppler, and power Doppler as needed of all accessible portions of each vessel. Bilateral testing is considered an integral part of a complete examination. Limited examinations for reoccurring indications may be performed as noted. The reflux portion of the exam is performed with the patient in reverse Trendelenburg.  +-----+---------------+---------+-----------+----------+--------------+ RIGHTCompressibilityPhasicitySpontaneityPropertiesThrombus Aging +-----+---------------+---------+-----------+----------+--------------+ CFV  Full           Yes      Yes                                 +-----+---------------+---------+-----------+----------+--------------+   +---------+---------------+---------+-----------+----------+--------------+ LEFT  CompressibilityPhasicitySpontaneityPropertiesThrombus Aging +---------+---------------+---------+-----------+----------+--------------+ CFV      Full           Yes      Yes                                 +---------+---------------+---------+-----------+----------+--------------+ SFJ      Full                                                        +---------+---------------+---------+-----------+----------+--------------+ FV Prox  Full                                                        +---------+---------------+---------+-----------+----------+--------------+ FV Mid   Full                                                         +---------+---------------+---------+-----------+----------+--------------+ FV DistalFull                                                        +---------+---------------+---------+-----------+----------+--------------+ PFV      Full                                                        +---------+---------------+---------+-----------+----------+--------------+ POP      Full           Yes      Yes                                 +---------+---------------+---------+-----------+----------+--------------+ PTV      Full                                                        +---------+---------------+---------+-----------+----------+--------------+ PERO     Full                                                        +---------+---------------+---------+-----------+----------+--------------+     Summary: RIGHT: - No evidence of common femoral vein obstruction.  LEFT: - No evidence of deep vein thrombosis in the lower extremity. No indirect evidence of obstruction proximal to the inguinal ligament. - No cystic structure found in the popliteal fossa.  *See table(s) above for measurements  and observations.    Preliminary    DG Chest 2 View  Result Date: 01/22/2022 CLINICAL DATA:  Left-sided chest pain with shortness of breath, intermittently for 2 weeks. EXAM: CHEST - 2 VIEW COMPARISON:  None Available. FINDINGS: Cardiac silhouette is normal in size and configuration. Normal mediastinal and hilar contours. Mild linear scarring or atelectasis in the right middle lobe and lateral left upper lobe. Lungs otherwise clear. No pleural effusion or pneumothorax. Skeletal structures are intact. IMPRESSION: No active cardiopulmonary disease. Electronically Signed   By: Lajean Manes M.D.   On: 01/22/2022 16:34    Procedures Procedures    Medications Ordered in ED Medications  aspirin EC tablet 81 mg (has no administration in time range)  amLODipine (NORVASC) tablet 10  mg (10 mg Oral Given 01/22/22 2326)  chlorthalidone (HYGROTON) tablet 25 mg (has no administration in time range)  losartan (COZAAR) tablet 100 mg (100 mg Oral Given 01/22/22 2326)  rosuvastatin (CRESTOR) tablet 40 mg (40 mg Oral Given 01/22/22 2326)  empagliflozin (JARDIANCE) tablet 10 mg (10 mg Oral Given 01/22/22 2327)  insulin glargine-yfgn (SEMGLEE) injection 10 Units (has no administration in time range)  carvedilol (COREG) tablet 6.25 mg (has no administration in time range)  enoxaparin (LOVENOX) injection 40 mg (has no administration in time range)  insulin aspart (novoLOG) injection 0-15 Units (has no administration in time range)  aspirin chewable tablet 324 mg (324 mg Oral Given 01/22/22 1737)  fentaNYL (SUBLIMAZE) injection 50 mcg (50 mcg Intravenous Given 01/22/22 1931)  iohexol (OMNIPAQUE) 350 MG/ML injection 100 mL (100 mLs Intravenous Contrast Given 01/22/22 1901)    ED Course/ Medical Decision Making/ A&P                           Medical Decision Making Amount and/or Complexity of Data Reviewed Labs: ordered.    Details: Troponins elevated but flat. Ddimer mildly elevated but age adjusted negative. Hyperglycemic Radiology: ordered and independent interpretation performed.    Details: No aortic dissection.  Chest x-ray unremarkable ECG/medicine tests: independent interpretation performed.    Details: Nonspecific findings but no ST elevation MI  Risk OTC drugs. Prescription drug management. Decision regarding hospitalization.   Patient's back pain seems musculoskeletal. However with his HTN, radiation to head and chest and neck, and elevated troponin, a CTA was obtained to rule out dissection. This is negative. 2nd trop similar to the first. No obvious CHF. No DVT in leg.  Unclear what is causing his troponin elevation unless is just secondary to the hypertension.  However with no significant prior work-up for cardiac disease I think he will need echo and monitoring.  I have  consulted cardiology, Dr. Alfred Levins, who advises to hold off on heparin. He will see tonight. However he asks for a medical admission. Discussed with family practice who will admit.        Final Clinical Impression(s) / ED Diagnoses Final diagnoses:  Acute left-sided thoracic back pain  Elevated troponin    Rx / DC Orders ED Discharge Orders     None         Sherwood Gambler, MD 01/22/22 [phone]

## 2022-01-22 NOTE — Assessment & Plan Note (Addendum)
Hyperglycemic on admission to 362.  Previous A1c 9.8 in 08/2021.  A1c during this admission is 8.9 and his sugars have been relatively stable in the upper 100s to mid 200s.  On metformin 1000 mg twice daily, Jardiance 10 mg daily, glargine 30 units daily, aspart 10 units before dinner.  Compliance at home is different with metformin 500 mg daily and glargine 10 units daily. Likely will need adjusted insulin regimen prior to going home -Semglee 10 units, moderate SSI -CBG monitoring 4 times daily, before meals and at bedtime

## 2022-01-22 NOTE — ED Triage Notes (Signed)
Pt here for L side cp w/ sob that has been intermittent for 2 weeks. Pt reports it started as pain in his shoulder, thought he had pulled a muscle, but pain is now L side of chest w/ radiation to L rib cage, L arm and into L side of neck. Pt denies N/V. Pain is worse w/ inspiration

## 2022-01-22 NOTE — ED Notes (Signed)
Pt no answer for triage

## 2022-01-22 NOTE — Hospital Course (Signed)
[name] is a 71 y.o.male with a history of CAD s/p PCI to pLAD in 2005, HTN, T2DM, HLD who was admitted to the [facility] at [facility] for atypical chest pain. His hospital course is detailed below:  Atypical Chest Pain, hx CAD s/p PCI in 2005 Likely MSK related, noncardiac pain.  Initially presented with atypical chest and back pain with flat troponins but concerning history of dyspnea on exertion.  CXR and CTA chest negative.  Cardiology followed, no cardiac testing indicated inpatient.  Lipid panel completed and showed decreased LDL (104) compared to previous (141).  Goal LDL <70. continued on home ASA and Crestor. His pain improved with lidocaine patch.  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  Cardiology followed and adjusted medication regimen.  Final regimen at discharge is as follows:  Coreg 12.5 mg twice daily Jardiance 10 mg once daily Amlodipine 10 mg once daily Rosuvastatin 40 mg nightly Aspirin 81 mg daily  Of note, pt was switched from Losartan to [facility] but this was held due to AKI. Chlorthalidone and Aldactone were also held. After BMP recheck, may consider restarting if kidney function normalizes.   HTN Patient was hypertensive during his stay.  No PRNs were needed.  See above regarding hypertensive cardiomyopathy.  Medication regimen at discharge as above.  HLD Lipid panel completed and showed decreased LDL (104) compared to previous (141).  Goal LDL <70. continued on home ASA and Crestor.  Continue to follow patient.  T2DM Continued on home regimen.  A1c 8.9.  Blood sugars are relatively stable in the upper 100s to mid 200s during his stay with a couple of readings in the 300s.   PCP Follow-up Recommendations: Please ensure f/u with cardiology 1 week after discharge Please recheck BMP on 10/4. Pt had an AKI towards the end of his stay and his  Entresto, Aldactone, and chlorthalidone were held, can restart once renal function normalizes Ensure adherence to medication regimen F/u T2DM management, pt's A1c was 8.9 and blood glucose was relatively stable in the 100-200s. Had some hyperglycemia into the 300s. No changes were made to his home regimen on discharge Goal LDL is <70, continued on home Crestor during hospital stay Patient started on daily MiraLAX during his stay for reported chronic constipation, may follow-up outpatient

## 2022-01-22 NOTE — Progress Notes (Signed)
VASCULAR LAB    Left lower extremity venous duplex has been performed.  See CV proc for preliminary results.  Messaged negative results to Dr. Regenia Skeeter via secure chat. Zarya Lasseigne, RVT 01/22/2022, 6:39 PM

## 2022-01-23 ENCOUNTER — Observation Stay (HOSPITAL_BASED_OUTPATIENT_CLINIC_OR_DEPARTMENT_OTHER): Payer: Medicare HMO

## 2022-01-23 DIAGNOSIS — R0600 Dyspnea, unspecified: Secondary | ICD-10-CM | POA: Diagnosis not present

## 2022-01-23 DIAGNOSIS — R7989 Other specified abnormal findings of blood chemistry: Secondary | ICD-10-CM | POA: Diagnosis not present

## 2022-01-23 DIAGNOSIS — I10 Essential (primary) hypertension: Secondary | ICD-10-CM

## 2022-01-23 DIAGNOSIS — I2089 Other forms of angina pectoris: Secondary | ICD-10-CM | POA: Diagnosis not present

## 2022-01-23 DIAGNOSIS — R079 Chest pain, unspecified: Secondary | ICD-10-CM | POA: Diagnosis not present

## 2022-01-23 DIAGNOSIS — R0609 Other forms of dyspnea: Secondary | ICD-10-CM | POA: Diagnosis not present

## 2022-01-23 DIAGNOSIS — E119 Type 2 diabetes mellitus without complications: Secondary | ICD-10-CM

## 2022-01-23 DIAGNOSIS — I5041 Acute combined systolic (congestive) and diastolic (congestive) heart failure: Secondary | ICD-10-CM

## 2022-01-23 LAB — ECHOCARDIOGRAM COMPLETE
Area-P 1/2: 3.99 cm2
Calc EF: 43.3 %
S' Lateral: 3.1 cm
Single Plane A2C EF: 30.4 %
Single Plane A4C EF: 50.3 %

## 2022-01-23 LAB — BASIC METABOLIC PANEL
Anion gap: 8 (ref 5–15)
BUN: 15 mg/dL (ref 8–23)
CO2: 25 mmol/L (ref 22–32)
Calcium: 8.9 mg/dL (ref 8.9–10.3)
Chloride: 105 mmol/L (ref 98–111)
Creatinine, Ser: 1.18 mg/dL (ref 0.61–1.24)
GFR, Estimated: >60 mL/min (ref >60)
Glucose, Bld: 291 mg/dL — ABNORMAL HIGH (ref 70–99)
Potassium: 3.6 mmol/L (ref 3.5–5.1)
Sodium: 138 mmol/L (ref 135–145)

## 2022-01-23 LAB — GLUCOSE, CAPILLARY
Glucose-Capillary: 265 mg/dL — ABNORMAL HIGH (ref 70–99)
Glucose-Capillary: 159 mg/dL — ABNORMAL HIGH (ref 70–99)

## 2022-01-23 LAB — LIPID PANEL
Cholesterol: 207 mg/dL — ABNORMAL HIGH (ref 0–200)
HDL: 66 mg/dL (ref >40)
LDL Cholesterol: 104 mg/dL — ABNORMAL HIGH (ref 0–99)
Total CHOL/HDL Ratio: 3.1 RATIO
Triglycerides: 186 mg/dL — ABNORMAL HIGH (ref <150)
VLDL: 37 mg/dL (ref 0–40)

## 2022-01-23 LAB — CBG MONITORING, ED
Glucose-Capillary: 232 mg/dL — ABNORMAL HIGH (ref 70–99)
Glucose-Capillary: 194 mg/dL — ABNORMAL HIGH (ref 70–99)

## 2022-01-23 LAB — HEMOGLOBIN A1C
Hgb A1c MFr Bld: 8.9 % — ABNORMAL HIGH (ref 4.8–5.6)
Mean Plasma Glucose: 208.73 mg/dL

## 2022-01-23 LAB — TSH: TSH: 2.436 u[IU]/mL (ref 0.350–4.500)

## 2022-01-23 MED ORDER — ACETAMINOPHEN 500 MG PO TABS
1000.0000 mg | ORAL_TABLET | Freq: Once | ORAL | Status: AC
  Administered 2022-01-23: 1000 mg via ORAL
  Filled 2022-01-23: qty 2

## 2022-01-23 MED ORDER — FUROSEMIDE 10 MG/ML IJ SOLN
40.0000 mg | Freq: Once | INTRAMUSCULAR | Status: AC
  Administered 2022-01-23: 40 mg via INTRAVENOUS
  Filled 2022-01-23: qty 4

## 2022-01-23 MED ORDER — SPIRONOLACTONE 25 MG PO TABS
25.0000 mg | ORAL_TABLET | Freq: Every day | ORAL | Status: DC
  Administered 2022-01-23: 25 mg via ORAL
  Filled 2022-01-23: qty 1

## 2022-01-23 MED ORDER — SACUBITRIL-VALSARTAN 49-51 MG PO TABS
1.0000 | ORAL_TABLET | Freq: Two times a day (BID) | ORAL | Status: DC
  Administered 2022-01-23: 1 via ORAL
  Filled 2022-01-23: qty 1

## 2022-01-23 MED ORDER — LIDOCAINE 5 % EX PTCH
1.0000 | MEDICATED_PATCH | CUTANEOUS | Status: DC
  Administered 2022-01-23 – 2022-01-24 (×2): 1 via TRANSDERMAL
  Filled 2022-01-23 (×2): qty 1

## 2022-01-23 MED ORDER — CARVEDILOL 12.5 MG PO TABS
12.5000 mg | ORAL_TABLET | Freq: Two times a day (BID) | ORAL | Status: DC
  Administered 2022-01-23 – 2022-01-24 (×2): 12.5 mg via ORAL
  Filled 2022-01-23 (×2): qty 1

## 2022-01-23 MED ORDER — ASPIRIN 81 MG PO TBEC
81.0000 mg | DELAYED_RELEASE_TABLET | Freq: Every day | ORAL | Status: DC
  Administered 2022-01-23: 81 mg via ORAL
  Filled 2022-01-23: qty 1

## 2022-01-23 NOTE — ED Notes (Signed)
ED TO INPATIENT HANDOFF REPORT   S Name/Age/Gender [name] 71 y.o. male Room/Bed: 039C/039C  Code Status   Code Status: Full Code  Home/SNF/Other Home Patient oriented to: self, place, time, and situation Is this baseline? 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 Pt reports it started as pain in his shoulder, thought he had pulled a muscle, but pain is now L side of chest w/ radiation to L rib cage, L arm and into L side of neck. Pt denies N/V. Pain is worse w/ inspiration   Allergies Allergies  Allergen Reactions   Trulicity [Dulaglutide] Other (See Comments)    Patient indicated this caused pancreatitis.     Level of Care/Admitting Diagnosis ED Disposition     ED Disposition  Admit   Condition  --   Comment  Hospital Area: [facility] [[id number]]  Level of Care: Telemetry Medical [104]  May place patient in observation at [facility] or [place] if equivalent level of care is available:: No  Covid Evaluation: Asymptomatic - no recent exposure (last 10 days) testing not required  Diagnosis: Atypical angina [319240]  Admitting Physician: Wells Guiles [[id number]]  Attending Physician: Lenoria Chime [[id number]]          B Medical/Surgery History Past Medical History:  Diagnosis Date   Diabetes mellitus without complication ([place])    Hypercholesterolemia    Hypertension    MI (myocardial infarction) ([place])    Past Surgical History:  Procedure Laterality Date   CORONARY ANGIOPLASTY WITH STENT PLACEMENT  08/01/2003   Proximal LAD     A IV Location/Drains/Wounds Patient Lines/Drains/Airways Status     Active Line/Drains/Airways     Name Placement date Placement time Site Days   Peripheral IV 01/22/22 20 G Right Antecubital 01/22/22  1728  Antecubital  1            Intake/Output Last 24 hours No intake  or output data in the 24 hours ending 01/23/22 1238  Labs/Imaging Results for orders placed or performed during the hospital encounter of 01/22/22 (from the past 48 hour(s))  CBC with Differential     Status: None   Collection Time: 01/22/22  3:56 PM  Result Value Ref Range   WBC 9.5 4.0 - 10.5 K/uL   RBC 4.91 4.22 - 5.81 MIL/uL   Hemoglobin 14.2 13.0 - 17.0 g/dL   HCT 44.0 39.0 - 52.0 %   MCV 89.6 80.0 - 100.0 fL   MCH 28.9 26.0 - 34.0 pg   MCHC 32.3 30.0 - 36.0 g/dL   RDW 12.5 11.5 - 15.5 %   Platelets 294 150 - 400 K/uL   nRBC 0.0 0.0 - 0.2 %   Neutrophils Relative % 67 %   Neutro Abs 6.4 1.7 - 7.7 K/uL   Lymphocytes Relative 24 %   Lymphs Abs 2.3 0.7 - 4.0 K/uL   Monocytes Relative 6 %   Monocytes Absolute 0.6 0.1 - 1.0 K/uL   Eosinophils Relative 2 %   Eosinophils Absolute 0.2 0.0 - 0.5 K/uL   Basophils Relative 1 %   Basophils Absolute 0.1 0.0 - 0.1 K/uL   Immature Granulocytes 0 %   Abs Immature Granulocytes 0.03 0.00 - 0.07 K/uL    Comment: Performed at [facility], [address] [place], [place] [address]  Comprehensive metabolic panel  Status: Abnormal   Collection Time: 01/22/22  3:56 PM  Result Value Ref Range   Sodium 139 135 - 145 mmol/L   Potassium 4.6 3.5 - 5.1 mmol/L   Chloride 105 98 - 111 mmol/L   CO2 25 22 - 32 mmol/L   Glucose, Bld 362 (H) 70 - 99 mg/dL    Comment: Glucose reference range applies only to samples taken after fasting for at least 8 hours.   BUN 18 8 - 23 mg/dL   Creatinine, Ser 1.29 (H) 0.61 - 1.24 mg/dL   Calcium 9.1 8.9 - 10.3 mg/dL   Total Protein 6.9 6.5 - 8.1 g/dL   Albumin 3.5 3.5 - 5.0 g/dL   AST 26 15 - 41 U/L   ALT 20 0 - 44 U/L   Alkaline Phosphatase 53 38 - 126 U/L   Total Bilirubin 0.3 0.3 - 1.2 mg/dL   GFR, Estimated 59 (L) >60 mL/min    Comment: (NOTE) Calculated using the CKD-EPI Creatinine Equation (2021)    Anion gap 9 5 - 15    Comment: Performed at [facility] [address],  [place], [place] [address]  Magnesium     Status: None   Collection Time: 01/22/22  3:56 PM  Result Value Ref Range   Magnesium 2.4 1.7 - 2.4 mg/dL    Comment: Performed at [facility], [place] [address] [place], [place] [address]  Troponin I (High Sensitivity)     Status: Abnormal   Collection Time: 01/22/22  3:56 PM  Result Value Ref Range   Troponin I (High Sensitivity) 114 (HH) <18 ng/L    Comment: CRITICAL RESULT CALLED TO, READ BACK BY AND VERIFIED WITH M. COCHRAN RN 01/22/22 '@1650'$  BY J. WHITE (NOTE) Elevated high sensitivity troponin I (hsTnI) values and significant  changes across serial measurements may suggest ACS but many other  chronic and acute conditions are known to elevate hsTnI results.  Refer to the "Links" section for chest pain algorithms and additional  guidance. Performed at [facility], [place] [address], [place], [place] [address]   Brain natriuretic peptide     Status: None   Collection Time: 01/22/22  3:56 PM  Result Value Ref Range   B Natriuretic Peptide 91.7 0.0 - 100.0 pg/mL    Comment: Performed at [facility] [address] [place], [place] [address]  Troponin I (High Sensitivity)     Status: Abnormal   Collection Time: 01/22/22  5:24 PM  Result Value Ref Range   Troponin I (High Sensitivity) 121 (HH) <18 ng/L    Comment: CRITICAL VALUE NOTED. VALUE IS CONSISTENT WITH PREVIOUSLY REPORTED/CALLED VALUE (NOTE) Elevated high sensitivity troponin I (hsTnI) values and significant  changes across serial measurements may suggest ACS but many other  chronic and acute conditions are known to elevate hsTnI results.  Refer to the "Links" section for chest pain algorithms and additional  guidance. Performed at [facility], [place] [address], [place], [place] [address]   D-dimer, quantitative     Status: Abnormal   Collection Time: 01/22/22  5:24 PM  Result Value Ref Range   D-Dimer, Quant 0.66 (H) 0.00 - 0.50 ug/mL-FEU    Comment:  (NOTE) At the manufacturer cut-off value of 0.5 g/mL FEU, this assay has a negative predictive value of 95-100%.This assay is intended for use in conjunction with a clinical pretest probability (PTP) assessment model to exclude pulmonary embolism (PE) and deep venous thrombosis (  DVT) in outpatients suspected of PE or DVT. Results should be correlated with clinical presentation. Performed at [facility], [place] [address], [place], [place] [address]   CBG monitoring, ED     Status: Abnormal   Collection Time: 01/22/22 11:18 PM  Result Value Ref Range   Glucose-Capillary 188 (H) 70 - 99 mg/dL    Comment: Glucose reference range applies only to samples taken after fasting for at least 8 hours.  Basic metabolic panel     Status: Abnormal   Collection Time: 01/23/22 12:55 AM  Result Value Ref Range   Sodium 138 135 - 145 mmol/L   Potassium 3.6 3.5 - 5.1 mmol/L   Chloride 105 98 - 111 mmol/L   CO2 25 22 - 32 mmol/L   Glucose, Bld 291 (H) 70 - 99 mg/dL    Comment: Glucose reference range applies only to samples taken after fasting for at least 8 hours.   BUN 15 8 - 23 mg/dL   Creatinine, Ser 1.18 0.61 - 1.24 mg/dL   Calcium 8.9 8.9 - 10.3 mg/dL   GFR, Estimated >60 >60 mL/min    Comment: (NOTE) Calculated using the CKD-EPI Creatinine Equation (2021)    Anion gap 8 5 - 15    Comment: Performed at [facility] [address] [place], [place] [address]  Hemoglobin A1c     Status: Abnormal   Collection Time: 01/23/22  4:30 AM  Result Value Ref Range   Hgb A1c MFr Bld 8.9 (H) 4.8 - 5.6 %    Comment: (NOTE) Pre diabetes:          5.7%-6.4%  Diabetes:              >6.4%  Glycemic control for   <7.0% adults with diabetes    Mean Plasma Glucose 208.73 mg/dL    Comment: Performed at [facility] [address], [place], [place] [address]  Lipid panel     Status: Abnormal   Collection Time: 01/23/22  4:30 AM  Result Value Ref Range   Cholesterol 207 (H) 0 - 200  mg/dL   Triglycerides 186 (H) <150 mg/dL   HDL 66 >40 mg/dL   Total CHOL/HDL Ratio 3.1 RATIO   VLDL 37 0 - 40 mg/dL   LDL Cholesterol 104 (H) 0 - 99 mg/dL    Comment:        Total Cholesterol/HDL:CHD Risk Coronary Heart Disease Risk Table                     Men   Women  1/2 Average Risk   3.4   3.3  Average Risk       5.0   4.4  2 X Average Risk   9.6   7.1  3 X Average Risk  23.4   11.0        Use the calculated Patient Ratio above and the CHD Risk Table to determine the patient's CHD Risk.        ATP III CLASSIFICATION (LDL):  <100     mg/dL   Optimal  100-129  mg/dL   Near or Above                    Optimal  130-159  mg/dL   Borderline  160-189  mg/dL   High  >190     mg/dL   Very High Performed at [facility] [address],  [place], [place] [address]   TSH     Status: None   Collection Time: 01/23/22  4:30 AM  Result Value Ref Range   TSH 2.436 0.350 - 4.500 uIU/mL    Comment: Performed by a 3rd Generation assay with a functional sensitivity of <=0.01 uIU/mL. Performed at [facility], [place] [address], [place], [place] [address]   CBG monitoring, ED     Status: Abnormal   Collection Time: 01/23/22  6:46 AM  Result Value Ref Range   Glucose-Capillary 232 (H) 70 - 99 mg/dL    Comment: Glucose reference range applies only to samples taken after fasting for at least 8 hours.   Comment 1 Notify RN    Comment 2 Document in Chart   CBG monitoring, ED     Status: Abnormal   Collection Time: 01/23/22 12:06 PM  Result Value Ref Range   Glucose-Capillary 194 (H) 70 - 99 mg/dL    Comment: Glucose reference range applies only to samples taken after fasting for at least 8 hours.   Comment 1 Notify RN    Comment 2 Document in Chart    ECHOCARDIOGRAM COMPLETE  Result Date: 01/23/2022    ECHOCARDIOGRAM REPORT   Patient Name:   [name] Date of Exam: 01/23/2022 Medical Rec #:  [id number]  Height:       66.0 in Accession #:    [id number] Weight:       205.0 lb  Date of Birth:  [birth year]   BSA:          2.021 m Patient Age:    9 years   BP:           164/87 mmHg Patient Gender: M          HR:           81 bpm. Exam Location:  Inpatient Procedure: 2D Echo, Cardiac Doppler and Color Doppler Indications:     Dyspnea R06.00  History:         Patient has no prior history of Echocardiogram examinations.                  Angina; Risk Factors:Hypertension and Diabetes.  Sonographer:     Ronny Flurry Referring Phys:  [id number] MARGARET E PRAY Diagnosing Phys: Lyman Bishop MD IMPRESSIONS  1. Left ventricular ejection fraction, by estimation, is 45 to 50%. Left ventricular ejection fraction by PLAX is 46 %. The left ventricle has mildly decreased function. The left ventricle demonstrates global hypokinesis. There is mild left ventricular hypertrophy. Left ventricular diastolic parameters are consistent with Grade I diastolic dysfunction (impaired relaxation). Elevated left ventricular end-diastolic pressure. The E/e' is 51.  2. Right ventricular systolic function is mildly reduced. The right ventricular size is normal.  3. The mitral valve is abnormal. Trivial mitral valve regurgitation.  4. The aortic valve is tricuspid. Aortic valve regurgitation is not visualized. Aortic valve sclerosis is present, with no evidence of aortic valve stenosis.  5. The inferior vena cava is normal in size with greater than 50% respiratory variability, suggesting right atrial pressure of 3 mmHg. Comparison(s): No prior Echocardiogram. FINDINGS  Left Ventricle: Left ventricular ejection fraction, by estimation, is 45 to 50%. Left ventricular ejection fraction by PLAX is 46 %. The left ventricle has mildly decreased function. The left ventricle demonstrates global hypokinesis. The left ventricular internal cavity size was normal in size. There is mild left ventricular hypertrophy. Left ventricular diastolic parameters are consistent with Grade I diastolic dysfunction (  impaired relaxation). Elevated  left ventricular end-diastolic pressure. The E/e' is 16. Right Ventricle: The right ventricular size is normal. No increase in right ventricular wall thickness. Right ventricular systolic function is mildly reduced. Left Atrium: Left atrial size was normal in size. Right Atrium: Right atrial size was normal in size. Pericardium: There is no evidence of pericardial effusion. Mitral Valve: The mitral valve is abnormal. Mild mitral annular calcification. Trivial mitral valve regurgitation. Tricuspid Valve: The tricuspid valve is grossly normal. Tricuspid valve regurgitation is trivial. Aortic Valve: The aortic valve is tricuspid. Aortic valve regurgitation is not visualized. Aortic valve sclerosis is present, with no evidence of aortic valve stenosis. Pulmonic Valve: The pulmonic valve was normal in structure. Pulmonic valve regurgitation is not visualized. Aorta: The aortic root and ascending aorta are structurally normal, with no evidence of dilitation. Venous: The inferior vena cava is normal in size with greater than 50% respiratory variability, suggesting right atrial pressure of 3 mmHg. IAS/Shunts: The interatrial septum appears to be lipomatous. No atrial level shunt detected by color flow Doppler.  LEFT VENTRICLE PLAX 2D LV EF:         Left            Diastology                ventricular     LV e' medial:    3.81 cm/s                ejection        LV E/e' medial:  17.3                fraction by     LV e' lateral:   4.35 cm/s                PLAX is 46      LV E/e' lateral: 15.2                %. LVIDd:         4.00 cm LVIDs:         3.10 cm LV PW:         1.20 cm LV IVS:        1.00 cm LVOT diam:     1.80 cm LV SV:         39 LV SV Index:   20 LVOT Area:     2.54 cm  LV Volumes (MOD) LV vol d, MOD    76.3 ml A2C: LV vol d, MOD    113.2 ml A4C: LV vol s, MOD    53.1 ml A2C: LV vol s, MOD    56.3 ml A4C: LV SV MOD A2C:   23.2 ml LV SV MOD A4C:   113.2 ml LV SV MOD BP:    41.8 ml RIGHT VENTRICLE RV S prime:      8.27 cm/s TAPSE (M-mode): 1.8 cm LEFT ATRIUM             Index        RIGHT ATRIUM          Index LA diam:        3.10 cm 1.53 cm/m   RA Area:     5.88 cm LA Vol (A2C):   25.6 ml 12.67 ml/m  RA Volume:   8.27 ml  4.09 ml/m LA Vol (A4C):   29.5 ml 14.60 ml/m LA Biplane Vol: 27.5 ml 13.61 ml/m  AORTIC VALVE LVOT Vmax:   87.00  cm/s LVOT Vmean:  56.100 cm/s LVOT VTI:    0.155 m  AORTA Ao Root diam: 2.80 cm Ao Asc diam:  2.60 cm MITRAL VALVE MV Area (PHT): 3.99 cm     SHUNTS MV Decel Time: 190 msec     Systemic VTI:  0.16 m MV E velocity: 66.00 cm/s   Systemic Diam: 1.80 cm MV A velocity: 104.00 cm/s MV E/A ratio:  0.63 Lyman Bishop MD Electronically signed by Lyman Bishop MD Signature Date/Time: 01/23/2022/10:25:46 AM    Final (Updated)    VAS [place] LOWER EXTREMITY VENOUS (DVT) (ONLY [facility] & [facility])  Result Date: 01/23/2022  Lower Venous DVT Study Patient Name:  JADYN BARGE  Date of Exam:   01/22/2022 Medical Rec #: [id number]   Accession #:    [id number] Date of Birth: 1950/09/20    Patient Gender: M Patient Age:   50 years Exam Location:  [facility] Procedure:      VAS [place] LOWER EXTREMITY VENOUS (DVT) Referring Phys: Nicki Reaper [place] --------------------------------------------------------------------------------  Indications: Swelling, and Left shoulder pain.  Limitations: Positioning and clothing. Comparison Study: No prior study Performing Technologist: Sharion Dove RVS  Examination Guidelines: A complete evaluation includes B-mode imaging, spectral Doppler, color Doppler, and power Doppler as needed of all accessible portions of each vessel. Bilateral testing is considered an integral part of a complete examination. Limited examinations for reoccurring indications may be performed as noted. The reflux portion of the exam is performed with the patient in reverse Trendelenburg.  +-----+---------------+---------+-----------+----------+--------------+  RIGHTCompressibilityPhasicitySpontaneityPropertiesThrombus Aging +-----+---------------+---------+-----------+----------+--------------+ CFV  Full           Yes      Yes                                 +-----+---------------+---------+-----------+----------+--------------+   +---------+---------------+---------+-----------+----------+--------------+ LEFT     CompressibilityPhasicitySpontaneityPropertiesThrombus Aging +---------+---------------+---------+-----------+----------+--------------+ CFV      Full           Yes      Yes                                 +---------+---------------+---------+-----------+----------+--------------+ SFJ      Full                                                        +---------+---------------+---------+-----------+----------+--------------+ FV Prox  Full                                                        +---------+---------------+---------+-----------+----------+--------------+ FV Mid   Full                                                        +---------+---------------+---------+-----------+----------+--------------+ FV DistalFull                                                        +---------+---------------+---------+-----------+----------+--------------+  PFV      Full                                                        +---------+---------------+---------+-----------+----------+--------------+ POP      Full           Yes      Yes                                 +---------+---------------+---------+-----------+----------+--------------+ PTV      Full                                                        +---------+---------------+---------+-----------+----------+--------------+ PERO     Full                                                        +---------+---------------+---------+-----------+----------+--------------+    Summary: RIGHT: - No evidence of common femoral vein  obstruction.  LEFT: - No evidence of deep vein thrombosis in the lower extremity. No indirect evidence of obstruction proximal to the inguinal ligament. - No cystic structure found in the popliteal fossa.  *See table(s) above for measurements and observations. Electronically signed by Deitra Mayo MD on 01/23/2022 at 6:48:12 AM.    Final    CT Angio Chest/Abd/Pel for Dissection W and/or Wo Contrast  Result Date: 01/22/2022 CLINICAL DATA:  Acute aortic syndrome (AAS) suspected EXAM: CT ANGIOGRAPHY CHEST, ABDOMEN AND PELVIS TECHNIQUE: Non-contrast CT of the chest was initially obtained. Multidetector CT imaging through the chest, abdomen and pelvis was performed using the standard protocol during bolus administration of intravenous contrast. Multiplanar reconstructed images and MIPs were obtained and reviewed to evaluate the vascular anatomy. RADIATION DOSE REDUCTION: This exam was performed according to the departmental dose-optimization program which includes automated exposure control, adjustment of the mA and/or kV according to patient size and/or use of iterative reconstruction technique. CONTRAST:  166m OMNIPAQUE IOHEXOL 350 MG/ML SOLN COMPARISON:  April 12, 2020. FINDINGS: CTA CHEST FINDINGS Cardiovascular: Preferential opacification of the thoracic aorta. No evidence of thoracic aortic aneurysm, intramural hematoma or dissection. Normal heart size. No pericardial effusion. Three-vessel coronary artery atherosclerotic calcifications. Aortic valve calcifications. Mediastinum/Nodes: No axillary or mediastinal adenopathy. Visualized thyroid is unremarkable. Lungs/Pleura: No pleural effusion or pneumothorax. No suspicious pulmonary masses or nodules. Scattered atelectasis. Musculoskeletal: No chest wall abnormality. No acute or significant osseous findings. Review of the MIP images confirms the above findings. CTA ABDOMEN AND PELVIS FINDINGS VASCULAR Aorta: Mild atherosclerotic calcifications  throughout the abdominal aorta. No aneurysm. No evidence of acute dissection. Celiac: Patent without evidence of aneurysm, dissection, vasculitis or significant stenosis. SMA: Patent without evidence of aneurysm, dissection, vasculitis or significant stenosis. Renals: Both renal arteries are patent without evidence of aneurysm, dissection, vasculitis, fibromuscular dysplasia or significant stenosis. IMA: Patent without evidence of aneurysm, dissection, vasculitis or significant stenosis. Inflow: Soft and calcified plaque of the LEFT common iliac results in mild  stenosis. 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 There is near complete stenosis of the RIGHT internal iliac artery due saccular dilation of a combination of soft and calcified plaque. Bilateral external iliacs and downstream are patent without stenosis. Veins: No obvious venous abnormality within the limitations of this arterial phase study. Review of the MIP images confirms the above findings. NON-VASCULAR Hepatobiliary: Unremarkable appearance of the liver and gallbladder for phase of contrast. No extrahepatic biliary ductal dilation. Pancreas: Unremarkable appearance of the pancreas for phase of contrast. No peripancreatic fat stranding. Spleen: Unremarkable for phase of contrast. Adrenals/Urinary Tract: Stable appearance of a 12 mm benign LEFT adrenal adenoma (for which no dedicated imaging follow-up is recommended). RIGHT adrenal gland is unremarkable. Kidneys enhance symmetrically. No hydronephrosis. No obstructing nephrolithiasis. Bladder is unremarkable. Stomach/Bowel: No evidence of bowel obstruction. Moderate colonic stool burden predominately within the RIGHT hemicolon. Appendix is normal. Stomach is decompressed. Lymphatic: No suspicious lymphadenopathy. Reproductive: Prostate is present. Other: Fat containing RIGHT inguinal hernia. Musculoskeletal: No acute osseous abnormality. Review of the MIP images  confirms the above findings. IMPRESSION: 1. No evidence of acute aortic injury in the chest, abdomen or pelvis. 2. No suspicious CT etiology for abdominal pain is identified. Aortic Atherosclerosis (ICD10-I70.0). Electronically Signed   By: Valentino Saxon M.D.   On: 01/22/2022 19:29   DG Chest 2 View  Result Date: 01/22/2022 CLINICAL DATA:  Left-sided chest pain with shortness of breath, intermittently for 2 weeks. EXAM: CHEST - 2 VIEW COMPARISON:  None Available. FINDINGS: Cardiac silhouette is normal in size and configuration. Normal mediastinal and hilar contours. Mild linear scarring or atelectasis in the right middle lobe and lateral left upper lobe. Lungs otherwise clear. No pleural effusion or pneumothorax. Skeletal structures are intact. IMPRESSION: No active cardiopulmonary disease. Electronically Signed   By: Lajean Manes M.D.   On: 01/22/2022 16:34    Pending Labs Unresulted Labs (From admission, onward)     Start     Ordered   01/24/22 7106  Basic metabolic panel  Daily at 5am,   R      01/23/22 1042   01/24/22 0500  Magnesium  Tomorrow morning,   R        01/23/22 1123            Vitals/Pain Today's Vitals   01/23/22 0930 01/23/22 0935 01/23/22 1000 01/23/22 1156  BP: (!) 168/97  (!) 167/93   Pulse: 78  78   Resp: 18  17   Temp:  98.2 F (36.8 C)    TempSrc:  Oral    SpO2: 99%  99%   PainSc:    5     Isolation Precautions No active isolations  Medications Medications  amLODipine (NORVASC) tablet 10 mg (10 mg Oral Given 01/23/22 0928)  chlorthalidone (HYGROTON) tablet 25 mg (25 mg Oral Given 01/23/22 0929)  rosuvastatin (CRESTOR) tablet 40 mg (40 mg Oral Given 01/23/22 0928)  empagliflozin (JARDIANCE) tablet 10 mg (10 mg Oral Given 01/23/22 0929)  insulin glargine-yfgn (SEMGLEE) injection 10 Units (10 Units Subcutaneous Given 01/23/22 0933)  enoxaparin (LOVENOX) injection 40 mg (40 mg Subcutaneous Given 01/23/22 0931)  insulin aspart (novoLOG) injection 0-15  Units (3 Units Subcutaneous Given 01/23/22 1211)  aspirin EC tablet 81 mg (has no administration in time range)  lidocaine (LIDODERM) 5 % 1 patch (1 patch Transdermal Patch Applied 01/23/22 0708)  carvedilol (COREG) tablet 12.5 mg (has no administration in time range)  spironolactone (ALDACTONE) tablet  25 mg (25 mg Oral Given 01/23/22 1152)  sacubitril-valsartan (ENTRESTO) 49-51 mg per tablet (has no administration in time range)  aspirin chewable tablet 324 mg (324 mg Oral Given 01/22/22 1737)  fentaNYL (SUBLIMAZE) injection 50 mcg (50 mcg Intravenous Given 01/22/22 1931)  iohexol (OMNIPAQUE) 350 MG/ML injection 100 mL (100 mLs Intravenous Contrast Given 01/22/22 1901)  acetaminophen (TYLENOL) tablet 1,000 mg (1,000 mg Oral Given 01/23/22 0706)  furosemide (LASIX) injection 40 mg (40 mg Intravenous Given 01/23/22 1151)    Mobility walks with device Low fall risk   Focused Assessments Cardiac Assessment Handoff:    No results found for: "CKTOTAL", "CKMB", "CKMBINDEX", "TROPONINI" Lab Results  Component Value Date   DDIMER 0.66 (H) 01/22/2022   Does the Patient currently have chest pain? No    R Recommendations: See Admitting Provider Note

## 2022-01-23 NOTE — Progress Notes (Addendum)
   Briefly seen at bedside-  agree with fellow consult note this morning. Chest pain atypical flat elevated troponin in the setting of elevated blood pressure and CKD. BNP low at 91. Echo personally reviewed shows mildly depressed LVEF to 45-50% with elevated LV filling pressure (he is also quite hypertensive). Suspect hypertensive cardiomyopathy. Increase coreg to 12.5 mg BID starting tonight. Will change losartan to Entresto 49/51 mg BID starting tonight. Continue Jardiance. Add aldactone 25 mg daily. Will give a lasix 40 mg IV x 1 dose as well.  Pixie Casino, MD, [facility], [place] Director of the Advanced Lipid Disorders &  Cardiovascular Risk Reduction Clinic Diplomate of the American Board of Clinical Lipidology Attending Cardiologist  Direct Dial: [phone]  Fax: [phone]  Website:  [email or web address]

## 2022-01-23 NOTE — Care Management Obs Status (Signed)
[place] NOTIFICATION   Patient Details  Name: [name] MRN: [id number] Date of Birth: Sep 20, 1950   Medicare Observation Status Notification Given:  Yes    Carles Collet, RN 01/23/2022, 3:18 PM

## 2022-01-23 NOTE — Consult Note (Signed)
Cardiology Consultation   Patient ID: [name] MRN: [id number]; DOB: 08-03-1950  Admit date: 01/22/2022 Date of Consult: 01/23/2022  PCP:  [name] Providers Cardiologist:  None        Patient Profile:   [name] is a 71 y.o. male with a hx of CAD s/p PCI to pLAD in 2005 ([place]), HTN, T2DM and HLD who is being seen 01/23/2022 for the evaluation of chest pain at the request of Dr. Regenia Skeeter  History of Present Illness:   [name] is a nice gentleman and recently retired. He reports left upper back pain approximately 3 weeks ago probably after he had a pulled muscle. The pain radiates to his neck and front chest associated with some headache. The pain is sharp, constant and worsened with deep inspiration and palpation and raising arms above his head.   He also states he started having shortness of breath while mowing lawns approximately 2 weeks ago. He confirms with me that his back pain/chest pain/neck pain did not get worse with mowing lawns.   He presented to our ED today due to concerning for back pain. He clearly states this is different from his chest pain prior to his stent placement in 2005. Denies fever, chills, dizziness, syncope, lightheadedness, cough, heart palpitations, orthopnea, PND, early satiety, dysuria, diarrhea, pedal edema or any bleeding events. Currently his pain is much improved after fentanyl.  Denies hx of smoking, alcohol abuse or illicit drug use.  On admission, K 4.6, Cr 1.29 (at his baseline), BNP (-), troponin 114->121 ECG'@30'$ -Sep-2023 15:40:36 sinus rhythm, anterior infarct, TWI lead V6 (slightly worse compared to ECG'@04'$ /25/2022) CXR No active cardiopulmonary disease. CTA negative for PE  Past Medical History:  Diagnosis Date   Diabetes mellitus without complication (HCC)    Hypercholesterolemia    Hypertension    MI (myocardial infarction) [facility])     Past Surgical History:  Procedure Laterality Date    CORONARY ANGIOPLASTY WITH STENT PLACEMENT  08/01/2003   Proximal LAD       Inpatient Medications: Scheduled Meds:  amLODipine  10 mg Oral Daily   aspirin EC  81 mg Oral Daily   carvedilol  6.25 mg Oral BID WC   chlorthalidone  25 mg Oral Daily   empagliflozin  10 mg Oral Daily   enoxaparin (LOVENOX) injection  40 mg Subcutaneous Q24H   insulin aspart  0-15 Units Subcutaneous TID WC   insulin glargine-yfgn  10 Units Subcutaneous Daily   losartan  100 mg Oral Daily   rosuvastatin  40 mg Oral Daily   Continuous Infusions:  PRN Meds:   Allergies:    Allergies  Allergen Reactions   Trulicity [Dulaglutide] Other (See Comments)    Patient indicated this caused pancreatitis.     Social History:   Social History   Socioeconomic History   Marital status: Married    Spouse name: 17/11/2003   Number of children: 4   Years of education: 10   Highest education level: 10th grade  Occupational History   Occupation: retired  Tobacco Use   Smoking status: Former    Packs/day: 0.50    Years: 10.00    Total pack years: 5.00    Types: Cigarettes    Quit date: 04/25/1993    Years since quitting: 28.7   Smokeless tobacco: Never  Substance and Sexual Activity   Alcohol use: No   Drug use: No   Sexual activity: Yes    Birth  control/protection: Post-menopausal  Other Topics Concern   Not on file  Social History Narrative   Patient lives with his wife in [place].   Patent has 4 boys, one lives in [place].    Patient enjoys fishing, camping in the woods, working outside, anything outdoor related.   Social Determinants of Health   Financial Resource Strain: Low Risk  (07/21/2020)   Overall Financial Resource Strain (CARDIA)    Difficulty of Paying Living Expenses: Not hard at all  Food Insecurity: No Food Insecurity (07/21/2020)   Hunger Vital Sign    Worried About Running Out of Food in the Last Year: Never true    Ran Out of Food in the Last Year: Never true  Transportation  Needs: No Transportation Needs (07/21/2020)   PRAPARE - Hydrologist (Medical): No    Lack of Transportation (Non-Medical): No  Physical Activity: Sufficiently Active (07/21/2020)   Exercise Vital Sign    Days of Exercise per Week: 3 days    Minutes of Exercise per Session: 50 min  Stress: No Stress Concern Present (07/21/2020)   [place]    Feeling of Stress : Not at all  Social Connections: Moderately Integrated (07/21/2020)   Social Connection and Isolation Panel [NHANES]    Frequency of Communication with Friends and Family: More than three times a week    Frequency of Social Gatherings with Friends and Family: More than three times a week    Attends Religious Services: More than 4 times per year    Active Member of [employer] or Organizations: No    Attends Archivist Meetings: Never    Marital Status: Married  Human resources officer Violence: Not At Risk (07/21/2020)   Humiliation, Afraid, Rape, and Kick questionnaire    Fear of Current or Ex-Partner: No    Emotionally Abused: No    Physically Abused: No    Sexually Abused: No    Family History:    Family History  Problem Relation Age of Onset   Heart disease Mother    Diabetes Mother    Hypertension Mother    Alcoholism Father    Heart disease Father    Hypertension Sister    Diabetes Brother    Hypercholesterolemia Brother    Osteoporosis Brother    Hypertension Brother    Kidney disease Brother      ROS:  Please see the history of present illness.   All other ROS reviewed and negative.     Physical Exam/Data:   Vitals:   01/22/22 2015 01/22/22 2030 01/23/22 0003 01/23/22 0003  BP: (!) 169/140 (!) 190/109 (!) 180/91   Pulse: 79 77 82   Resp: 19 (!) 24 14   Temp:    98.2 F (36.8 C)  TempSrc:    Oral  SpO2: 99% 98% 100%    No intake or output data in the 24 hours ending 01/23/22 0016    08/23/2021    1:52 PM  09/16/2020    2:13 PM 09/10/2020    9:03 AM  Last 3 Weights  Weight (lbs) 205 lb 197 lb 9.6 oz 195 lb 12.8 oz  Weight (kg) 92.987 kg 89.631 kg 88.814 kg     There is no height or weight on file to calculate BMI.  General:  Well nourished, well developed, in no acute distress HEENT: normal Neck: no JVD Vascular: No carotid bruits; Distal pulses 2+ bilaterally Cardiac:  normal S1,  S2; RRR; no murmur  Lungs:  clear to auscultation bilaterally, no wheezing, rhonchi or rales  Abd: soft, nontender, no hepatomegaly  Ext: no edema Musculoskeletal:  No deformities, BUE and BLE strength normal and equal. Tenderness of left upper back  Skin: warm and dry  Neuro:  CNs 2-12 intact, no focal abnormalities noted Psych:  Normal affect   EKG:  The EKG was personally reviewed and demonstrates:   Telemetry:  Telemetry was personally reviewed and demonstrates:     Laboratory Data:  High Sensitivity Troponin:   Recent Labs  Lab 01/22/22 1556 01/22/22 1724  TROPONINIHS 114* 121*     Chemistry Recent Labs  Lab 01/22/22 1556  NA 139  K 4.6  CL 105  CO2 25  GLUCOSE 362*  BUN 18  CREATININE 1.29*  CALCIUM 9.1  MG 2.4  GFRNONAA 59*  ANIONGAP 9    Recent Labs  Lab 01/22/22 1556  PROT 6.9  ALBUMIN 3.5  AST 26  ALT 20  ALKPHOS 53  BILITOT 0.3   Lipids No results for input(s): "CHOL", "TRIG", "HDL", "LABVLDL", "LDLCALC", "CHOLHDL" in the last 168 hours.  Hematology Recent Labs  Lab 01/22/22 1556  WBC 9.5  RBC 4.91  HGB 14.2  HCT 44.0  MCV 89.6  MCH 28.9  MCHC 32.3  RDW 12.5  PLT 294   Thyroid No results for input(s): "TSH", "FREET4" in the last 168 hours.  BNP Recent Labs  Lab 01/22/22 1556  BNP 91.7    DDimer  Recent Labs  Lab 01/22/22 1724  DDIMER 0.66*     Radiology/Studies:  CT Angio Chest/Abd/Pel for Dissection W and/or Wo Contrast  Result Date: 01/22/2022 CLINICAL DATA:  Acute aortic syndrome (AAS) suspected EXAM: CT ANGIOGRAPHY CHEST, ABDOMEN AND  PELVIS TECHNIQUE: Non-contrast CT of the chest was initially obtained. Multidetector CT imaging through the chest, abdomen and pelvis was performed using the standard protocol during bolus administration of intravenous contrast. Multiplanar reconstructed images and MIPs were obtained and reviewed to evaluate the vascular anatomy. RADIATION DOSE REDUCTION: This exam was performed according to the departmental dose-optimization program which includes automated exposure control, adjustment of the mA and/or kV according to patient size and/or use of iterative reconstruction technique. CONTRAST:  112m OMNIPAQUE IOHEXOL 350 MG/ML SOLN COMPARISON:  April 12, 2020. FINDINGS: CTA CHEST FINDINGS Cardiovascular: Preferential opacification of the thoracic aorta. No evidence of thoracic aortic aneurysm, intramural hematoma or dissection. Normal heart size. No pericardial effusion. Three-vessel coronary artery atherosclerotic calcifications. Aortic valve calcifications. Mediastinum/Nodes: No axillary or mediastinal adenopathy. Visualized thyroid is unremarkable. Lungs/Pleura: No pleural effusion or pneumothorax. No suspicious pulmonary masses or nodules. Scattered atelectasis. Musculoskeletal: No chest wall abnormality. No acute or significant osseous findings. Review of the MIP images confirms the above findings. CTA ABDOMEN AND PELVIS FINDINGS VASCULAR Aorta: Mild atherosclerotic calcifications throughout the abdominal aorta. No aneurysm. No evidence of acute dissection. Celiac: Patent without evidence of aneurysm, dissection, vasculitis or significant stenosis. SMA: Patent without evidence of aneurysm, dissection, vasculitis or significant stenosis. Renals: Both renal arteries are patent without evidence of aneurysm, dissection, vasculitis, fibromuscular dysplasia or significant stenosis. IMA: Patent without evidence of aneurysm, dissection, vasculitis or significant stenosis. Inflow: Soft and calcified plaque of the LEFT  common iliac results in mild stenosis. 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 There is near complete stenosis of the RIGHT internal iliac artery due saccular dilation of a combination of soft and calcified plaque. Bilateral external iliacs and downstream  are patent without stenosis. Veins: No obvious venous abnormality within the limitations of this arterial phase study. Review of the MIP images confirms the above findings. NON-VASCULAR Hepatobiliary: Unremarkable appearance of the liver and gallbladder for phase of contrast. No extrahepatic biliary ductal dilation. Pancreas: Unremarkable appearance of the pancreas for phase of contrast. No peripancreatic fat stranding. Spleen: Unremarkable for phase of contrast. Adrenals/Urinary Tract: Stable appearance of a 12 mm benign LEFT adrenal adenoma (for which no dedicated imaging follow-up is recommended). RIGHT adrenal gland is unremarkable. Kidneys enhance symmetrically. No hydronephrosis. No obstructing nephrolithiasis. Bladder is unremarkable. Stomach/Bowel: No evidence of bowel obstruction. Moderate colonic stool burden predominately within the RIGHT hemicolon. Appendix is normal. Stomach is decompressed. Lymphatic: No suspicious lymphadenopathy. Reproductive: Prostate is present. Other: Fat containing RIGHT inguinal hernia. Musculoskeletal: No acute osseous abnormality. Review of the MIP images confirms the above findings. IMPRESSION: 1. No evidence of acute aortic injury in the chest, abdomen or pelvis. 2. No suspicious CT etiology for abdominal pain is identified. Aortic Atherosclerosis (ICD10-I70.0). Electronically Signed   By: Valentino Saxon M.D.   On: 01/22/2022 19:29   VAS [place] LOWER EXTREMITY VENOUS (DVT) (ONLY [facility] & [facility])  Result Date: 01/22/2022  Lower Venous DVT Study Patient Name:  CARA AGUINO  Date of Exam:   01/22/2022 Medical Rec #: [id number]   Accession #:    [id number] Date of Birth: 09-19-1950    Patient  Gender: M Patient Age:   86 years Exam Location:  [facility] Procedure:      VAS [place] LOWER EXTREMITY VENOUS (DVT) Referring Phys: Nicki Reaper [place] --------------------------------------------------------------------------------  Indications: Swelling, and Left shoulder pain.  Limitations: Positioning and clothing. Comparison Study: No prior study Performing Technologist: Sharion Dove RVS  Examination Guidelines: A complete evaluation includes B-mode imaging, spectral Doppler, color Doppler, and power Doppler as needed of all accessible portions of each vessel. Bilateral testing is considered an integral part of a complete examination. Limited examinations for reoccurring indications may be performed as noted. The reflux portion of the exam is performed with the patient in reverse Trendelenburg.  +-----+---------------+---------+-----------+----------+--------------+ RIGHTCompressibilityPhasicitySpontaneityPropertiesThrombus Aging +-----+---------------+---------+-----------+----------+--------------+ CFV  Full           Yes      Yes                                 +-----+---------------+---------+-----------+----------+--------------+   +---------+---------------+---------+-----------+----------+--------------+ LEFT     CompressibilityPhasicitySpontaneityPropertiesThrombus Aging +---------+---------------+---------+-----------+----------+--------------+ CFV      Full           Yes      Yes                                 +---------+---------------+---------+-----------+----------+--------------+ SFJ      Full                                                        +---------+---------------+---------+-----------+----------+--------------+ FV Prox  Full                                                        +---------+---------------+---------+-----------+----------+--------------+  FV Mid   Full                                                         +---------+---------------+---------+-----------+----------+--------------+ FV DistalFull                                                        +---------+---------------+---------+-----------+----------+--------------+ PFV      Full                                                        +---------+---------------+---------+-----------+----------+--------------+ POP      Full           Yes      Yes                                 +---------+---------------+---------+-----------+----------+--------------+ PTV      Full                                                        +---------+---------------+---------+-----------+----------+--------------+ PERO     Full                                                        +---------+---------------+---------+-----------+----------+--------------+     Summary: RIGHT: - No evidence of common femoral vein obstruction.  LEFT: - No evidence of deep vein thrombosis in the lower extremity. No indirect evidence of obstruction proximal to the inguinal ligament. - No cystic structure found in the popliteal fossa.  *See table(s) above for measurements and observations.    Preliminary    DG Chest 2 View  Result Date: 01/22/2022 CLINICAL DATA:  Left-sided chest pain with shortness of breath, intermittently for 2 weeks. EXAM: CHEST - 2 VIEW COMPARISON:  None Available. FINDINGS: Cardiac silhouette is normal in size and configuration. Normal mediastinal and hilar contours. Mild linear scarring or atelectasis in the right middle lobe and lateral left upper lobe. Lungs otherwise clear. No pleural effusion or pneumothorax. Skeletal structures are intact. IMPRESSION: No active cardiopulmonary disease. Electronically Signed   By: Lajean Manes M.D.   On: 01/22/2022 16:34     Assessment and Plan:   #Chest pain/back pain -tenderness on palpation, probably non-cardiac -continue Telemetry -continue pain management per primary team  #DOE #CAD s/p  PCI to pLAD in 2015 -continue home med ASA, crestor coreg and losartan -check A1C, TSH and lipid panel -acquire a TTE, we will review and provide further recommendations as needed. -please set up a cardiology outpatient follow-up as patient would like to establish care with our group.   #Hypertension -continue home  Novasc, chlorthalidone and losartan  #Elevated troponins -in the setting of CKD and uncontrolled hypertension on admission -optimize his BP -continue trend troponin until peaks -acquire a TTE  Risk Assessment/Risk Scores:     For questions or updates, please contact [place] Please consult [email or web address] for contact info under    Signed, Laurice Record, MD  01/23/2022 12:16 AM

## 2022-01-23 NOTE — Progress Notes (Signed)
Daily Progress Note Intern Pager: [phone]  Patient name: [name] Medical record number: [id number] Date of birth: 11/20/1950 Age: 71 y.o. Gender: male  Primary Care Provider: [name], DO Consultants: Cardiology Code Status: Full  Pt Overview and Major Events to Date:  9/30 - admitted  Assessment and Plan:  [name] is a 71 y.o. male p/w left sided upper back pain x3wks with concern for atypical angina. Ddx includes ACS, aortic dissection, MSK pain, pancreatitis, GERD, fracture. Pt has a history of CAD s/p PCI to pLAD in 2005, HTN, T2DM, HLD.   * Atypical angina Presented with atypical angina with flat troponins but concerning history of dyspnea on exertion.  Suspect there is a component of MSK pain, but patient does have a history of CAD s/p PCI in 2005.  Cardiology following, recommended echo which showed evidence of possible hypertensive cardiomyopathy.  Imaging has otherwise been unremarkable.  Has been hypertensive during this admission but had not taken home meds.  BMP unremarkable aside from hyperglycemia.  Lipid panel overall unremarkable, TSH WNL.  -Cardiology consulted, appreciate recommendations --Coreg increased to 12.5, losartan switched to [facility], started Aldactone --Continue home Jardiance --IV Lasix x1 -Continuous cardiac monitoring x24 hours -Vital signs per unit, with pulse ox -Continue home hypertensive medications: Amlodipine 10 mg daily, chlorthalidone 25 mg daily, rosuvastatin 40 mg daily -Aspirin 81 mg  Type 2 diabetes mellitus without complication, without long-term current use of insulin ([name]) Hyperglycemic on admission to 362.  Previous A1c 9.8 in 08/2021.  A1c during this admission is 8.9 and his sugars have been relatively stable in the upper 100s to mid 200s.  On metformin 1000 mg twice daily, Jardiance 10 mg daily, glargine 30 units daily, aspart 10 units before dinner.  Compliance at home is different with metformin 500 mg daily and  glargine 10 units daily. Likely will need adjusted insulin regimen prior to going home -Semglee 10 units, moderate SSI -CBG monitoring 4 times daily, before meals and at bedtime       FEN/GI: Heart healthy diet PPx: Lovenox Dispo:Home pending clinical improvement . Barriers include cardiology eval.   Subjective:  Pt evaluated this afternoon after rounds. No concerns, states his pain has improved and is about 3-4/10.  Lidocaine patch has been helping.  Understands plan of care.  Objective: Temp:  [98 F (36.7 C)-98.4 F (36.9 C)] 98 F (36.7 C) (10/01 1326) Pulse Rate:  [73-94] 93 (10/01 1326) Resp:  [13-34] 18 (10/01 1326) BP: (122-204)/(60-140) 167/98 (10/01 1326) SpO2:  [96 %-100 %] 100 % (10/01 1326) Weight:  [86.7 kg] 86.7 kg (10/01 1326) Physical Exam: General: NAD, sitting in chair, eating lunch Cardiovascular: RRR no MRG Respiratory: CTAB normal WOB on RA Abdomen: Soft, NT/ND  Laboratory: Most recent CBC Lab Results  Component Value Date   WBC 9.5 01/22/2022   HGB 14.2 01/22/2022   HCT 44.0 01/22/2022   MCV 89.6 01/22/2022   PLT 294 01/22/2022   Most recent BMP    Latest Ref Rng & Units 01/23/2022   12:55 AM  BMP  Glucose 70 - 99 mg/dL 291   BUN 8 - 23 mg/dL 15   Creatinine 0.61 - 1.24 mg/dL 1.18   Sodium 135 - 145 mmol/L 138   Potassium 3.5 - 5.1 mmol/L 3.6   Chloride 98 - 111 mmol/L 105   CO2 22 - 32 mmol/L 25   Calcium 8.9 - 10.3 mg/dL 8.9     Lipid Panel  Component Value Date/Time   CHOL 207 (H) 01/23/2022 0430   CHOL 243 (H) 08/23/2021 1458   TRIG 186 (H) 01/23/2022 0430   HDL 66 01/23/2022 0430   HDL 77 08/23/2021 1458   CHOLHDL 3.1 01/23/2022 0430   VLDL 37 01/23/2022 0430   LDLCALC 104 (H) 01/23/2022 0430   LDLCALC 141 (H) 08/23/2021 1458   LDLDIRECT 56 09/10/2020 0947   LABVLDL 25 08/23/2021 1458     Echo: IMPRESSIONS     1. Left ventricular ejection fraction, by estimation, is 45 to 50%. Left  ventricular ejection  fraction by PLAX is 46 %. The left ventricle has  mildly decreased function. The left ventricle demonstrates global  hypokinesis. There is mild left ventricular  hypertrophy. Left ventricular diastolic parameters are consistent with  Grade I diastolic dysfunction (impaired relaxation). Elevated left  ventricular end-diastolic pressure. The E/e' is 47.   2. Right ventricular systolic function is mildly reduced. The right  ventricular size is normal.   3. The mitral valve is abnormal. Trivial mitral valve regurgitation.   4. The aortic valve is tricuspid. Aortic valve regurgitation is not  visualized. Aortic valve sclerosis is present, with no evidence of aortic  valve stenosis.   5. The inferior vena cava is normal in size with greater than 50%  respiratory variability, suggesting right atrial pressure of 3 mmHg.    Nicholas Albino, MD 01/23/2022, 3:35 PM  PGY-1, [place] Intern pager: [phone], text pages welcome Secure chat group [place]

## 2022-01-23 NOTE — ED Notes (Signed)
ECHO at bedside.

## 2022-01-24 ENCOUNTER — Other Ambulatory Visit: Payer: Self-pay | Admitting: Home Health

## 2022-01-24 ENCOUNTER — Other Ambulatory Visit (HOSPITAL_COMMUNITY): Payer: Self-pay

## 2022-01-24 ENCOUNTER — Telehealth (HOSPITAL_COMMUNITY): Payer: Self-pay

## 2022-01-24 ENCOUNTER — Encounter (HOSPITAL_COMMUNITY): Payer: Self-pay | Admitting: Student

## 2022-01-24 ENCOUNTER — Telehealth (HOSPITAL_COMMUNITY): Payer: Self-pay | Admitting: Pharmacy Technician

## 2022-01-24 DIAGNOSIS — I10 Essential (primary) hypertension: Secondary | ICD-10-CM | POA: Diagnosis not present

## 2022-01-24 DIAGNOSIS — N179 Acute kidney failure, unspecified: Secondary | ICD-10-CM | POA: Diagnosis not present

## 2022-01-24 DIAGNOSIS — Z955 Presence of coronary angioplasty implant and graft: Secondary | ICD-10-CM

## 2022-01-24 DIAGNOSIS — I2510 Atherosclerotic heart disease of native coronary artery without angina pectoris: Secondary | ICD-10-CM

## 2022-01-24 DIAGNOSIS — R0789 Other chest pain: Secondary | ICD-10-CM

## 2022-01-24 DIAGNOSIS — E785 Hyperlipidemia, unspecified: Secondary | ICD-10-CM

## 2022-01-24 DIAGNOSIS — Z5189 Encounter for other specified aftercare: Secondary | ICD-10-CM

## 2022-01-24 DIAGNOSIS — E7800 Pure hypercholesterolemia, unspecified: Secondary | ICD-10-CM

## 2022-01-24 DIAGNOSIS — I5022 Chronic systolic (congestive) heart failure: Secondary | ICD-10-CM

## 2022-01-24 DIAGNOSIS — E876 Hypokalemia: Secondary | ICD-10-CM

## 2022-01-24 DIAGNOSIS — I2089 Other forms of angina pectoris: Secondary | ICD-10-CM | POA: Diagnosis not present

## 2022-01-24 LAB — GLUCOSE, CAPILLARY
Glucose-Capillary: 153 mg/dL — ABNORMAL HIGH (ref 70–99)
Glucose-Capillary: 316 mg/dL — ABNORMAL HIGH (ref 70–99)

## 2022-01-24 LAB — BASIC METABOLIC PANEL
Anion gap: 10 (ref 5–15)
BUN: 20 mg/dL (ref 8–23)
CO2: 27 mmol/L (ref 22–32)
Calcium: 9.4 mg/dL (ref 8.9–10.3)
Chloride: 102 mmol/L (ref 98–111)
Creatinine, Ser: 1.64 mg/dL — ABNORMAL HIGH (ref 0.61–1.24)
GFR, Estimated: 44 mL/min — ABNORMAL LOW (ref >60)
Glucose, Bld: 102 mg/dL — ABNORMAL HIGH (ref 70–99)
Potassium: 3.4 mmol/L — ABNORMAL LOW (ref 3.5–5.1)
Sodium: 139 mmol/L (ref 135–145)

## 2022-01-24 LAB — MAGNESIUM: Magnesium: 2.2 mg/dL (ref 1.7–2.4)

## 2022-01-24 MED ORDER — POLYETHYLENE GLYCOL 3350 17 GM/SCOOP PO POWD
17.0000 g | Freq: Every day | ORAL | 0 refills | Status: DC
  Filled 2022-01-24: qty 238, 14d supply, fill #0

## 2022-01-24 MED ORDER — LIDOCAINE 5 % EX PTCH
1.0000 | MEDICATED_PATCH | CUTANEOUS | 0 refills | Status: DC
  Filled 2022-01-24: qty 30, 30d supply, fill #0

## 2022-01-24 MED ORDER — POLYETHYLENE GLYCOL 3350 17 G PO PACK
17.0000 g | PACK | Freq: Every day | ORAL | Status: DC
  Administered 2022-01-24: 17 g via ORAL
  Filled 2022-01-24: qty 1

## 2022-01-24 MED ORDER — CARVEDILOL 12.5 MG PO TABS
12.5000 mg | ORAL_TABLET | Freq: Two times a day (BID) | ORAL | 0 refills | Status: DC
  Filled 2022-01-24: qty 60, 30d supply, fill #0

## 2022-01-24 MED ORDER — POTASSIUM CHLORIDE CRYS ER 20 MEQ PO TBCR
40.0000 meq | EXTENDED_RELEASE_TABLET | Freq: Once | ORAL | Status: AC
  Administered 2022-01-24: 40 meq via ORAL
  Filled 2022-01-24: qty 2

## 2022-01-24 NOTE — Discharge Instructions (Addendum)
Dear [name],  Thank you for letting us participate in your care. You were hospitalized for chest and back pain and diagnosed with Cardiomyopathy. You were treated with several medications to help lower your blood pressure and any strain on your heart.   POST-HOSPITAL & CARE INSTRUCTIONS Please review your medication list and take note of any changes. Please attend your follow up appointments for further medication management You should get your blood checked on 10/4 (see below) Go to your follow up appointments (listed below)   DOCTOR'S APPOINTMENT   Future Appointments  Date Time Provider [place]  01/31/2022 10:05 AM Deberah Pelton, NP [facility] None  02/02/2022 10:30 AM [name], DO [facility] [place]    Follow-up Information     Lilland, Alana, DO. Go on 02/02/2022.   Specialty: Family Medicine Why: '@10'$ :30am Contact information: [place] [address] [phone]         Deberah Pelton, NP Follow up on 01/31/2022.   Specialty: Cardiology Why: at 1005am with your cardiology Contact information: [address] [place] [place] [address] [phone]         [facility] [address] [facility] Follow up on 01/26/2022.   Specialty: Cardiology Why: please arrive between 8-4 for your blood work BMP Contact information: [place] [id number] [place] [phone]                Take care and be well!  [facility]  [place], [place] [address] [phone]

## 2022-01-24 NOTE — TOC Benefit Eligibility Note (Signed)
Patient Teacher, English as a foreign language completed.    The patient is currently admitted and upon discharge could be taking Entresto 24-26 mg.  The current 30 day co-pay is $45.00.   The patient is insured through [place], [place] Patient Advocate Specialist [place] Patient Advocate Team Direct Number: [phone]  Fax: [phone]

## 2022-01-24 NOTE — Assessment & Plan Note (Signed)
K 3.4 this morning. -Repleted with 40 mEq KCl

## 2022-01-24 NOTE — Discharge Summary (Addendum)
[facility] Discharge Summary  Patient name: [name] Medical record number: [id number] Date of birth: 09-Nov-1950 Age: 71 y.o. Gender: male Date of Admission: 01/22/2022  Date of Discharge: 01/24/22 Admitting Physician: Wells Guiles, DO  Primary Care Provider: [name], DO Consultants: Cardiology  Indication for Hospitalization: Atypical chest pain with significant cardiac history  Discharge Diagnoses/Problem List:  Principal Problem for Admission: Atypical chest pain Other Problems addressed during stay:  Principal Problem:   Atypical angina Active Problems:   Type 2 diabetes mellitus without complication, without long-term current use of insulin ([place])   AKI (acute kidney injury) ([place])   Heart failure with mildly reduced ejection fraction [facility])   Hypokalemia    Brief Hospital Course:  Brazen Domangue is a 71 y.o.male with a history of CAD s/p PCI to pLAD in 2005, HTN, T2DM, HLD who was admitted to the [facility] Medicine Teaching Service at [facility] for atypical chest pain. His hospital course is detailed below:  Atypical Chest Pain, hx CAD s/p PCI in 2005 Likely MSK related, noncardiac pain.  Initially presented with atypical chest and back pain with flat troponins but concerning history of dyspnea on exertion.  CXR and CTA chest negative.  Cardiology followed, no cardiac testing indicated inpatient.  Lipid panel completed and showed decreased LDL (104) compared to previous (141).  Goal LDL <70. continued on home ASA and Crestor. His pain improved with lidocaine patch.  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  Cardiology followed and adjusted medication regimen.  Final regimen at discharge is as follows:  Coreg 12.5 mg twice daily Jardiance 10 mg once daily Amlodipine 10 mg once daily Rosuvastatin 40 mg nightly Aspirin 81 mg daily  Of note, pt was  switched from Losartan to [facility] but this was held due to AKI. Chlorthalidone and Aldactone were also held. After BMP recheck, may consider restarting if kidney function normalizes.   HTN Patient was hypertensive during his stay.  No PRNs were needed.  See above regarding hypertensive cardiomyopathy.  Medication regimen at discharge as above.  HLD Lipid panel completed and showed decreased LDL (104) compared to previous (141).  Goal LDL <70. continued on home ASA and Crestor.  Continue to follow patient.  T2DM Continued on home regimen.  A1c 8.9.  Blood sugars are relatively stable in the upper 100s to mid 200s during his stay with a couple of readings in the 300s.   PCP Follow-up Recommendations: Please ensure f/u with cardiology 1 week after discharge Please recheck BMP on 10/4. Pt had an AKI towards the end of his stay and his Entresto, Aldactone, and chlorthalidone were held, can restart once renal function normalizes Ensure adherence to medication regimen F/u T2DM management, pt's A1c was 8.9 and blood glucose was relatively stable in the 100-200s. Had some hyperglycemia into the 300s. No changes were made to his home regimen on discharge Goal LDL is <70, continued on home Crestor during hospital stay Patient started on daily MiraLAX during his stay for reported chronic constipation, may follow-up outpatient   Disposition: Home  Discharge Condition: Stable, improved   Discharge Exam:  Vitals:   01/24/22 0425 01/24/22 1103  BP: (!) 145/84 (!) 146/91  Pulse: 82 83  Resp: 19 18  Temp: 98 F (36.7 C) 98 F (36.7 C)  SpO2: 99% 98%   General: NAD, sitting in chair, conversational Cardiovascular: RRR no MRG Respiratory: CTAB normal WOB  on RA Abdomen: Soft, NT/ND, normal BS Extremities: No pitting edema in lower extremities  Significant Procedures: N/A  Significant Labs and Imaging:  Recent Labs  Lab 01/22/22 1556  WBC 9.5  HGB 14.2  HCT 44.0  PLT 294   Recent  Labs  Lab 01/22/22 1556 01/23/22 0055 01/24/22 0106  NA 139 138 139  K 4.6 3.6 3.4*  CL 105 105 102  CO2 $Re'25 25 27  'fbX$ GLUCOSE 362* 291* 102*  BUN $Re'18 15 20  'efz$ CREATININE 1.29* 1.18 1.64*  CALCIUM 9.1 8.9 9.4  MG 2.4  --  2.2  ALKPHOS 53  --   --   AST 26  --   --   ALT 20  --   --   ALBUMIN 3.5  --   --     Pertinent Imaging:  ECHO IMPRESSIONS     1. Left ventricular ejection fraction, by estimation, is 45 to 50%. Left  ventricular ejection fraction by PLAX is 46 %. The left ventricle has  mildly decreased function. The left ventricle demonstrates global  hypokinesis. There is mild left ventricular  hypertrophy. Left ventricular diastolic parameters are consistent with  Grade I diastolic dysfunction (impaired relaxation). Elevated left  ventricular end-diastolic pressure. The E/e' is 73.   2. Right ventricular systolic function is mildly reduced. The right  ventricular size is normal.   3. The mitral valve is abnormal. Trivial mitral valve regurgitation.   4. The aortic valve is tricuspid. Aortic valve regurgitation is not  visualized. Aortic valve sclerosis is present, with no evidence of aortic  valve stenosis.   5. The inferior vena cava is normal in size with greater than 50%  respiratory variability, suggesting right atrial pressure of 3 mmHg.    CTA CHEST  IMPRESSION: 1. No evidence of acute aortic injury in the chest, abdomen or pelvis. 2. No suspicious CT etiology for abdominal pain is identified.   Aortic Atherosclerosis (ICD10-I70.0).  DVT STUDY Summary:  RIGHT:  - No evidence of common femoral vein obstruction.     LEFT:  - No evidence of deep vein thrombosis in the lower extremity. No indirect  evidence of obstruction proximal to the inguinal ligament.  - No cystic structure found in the popliteal fossa.      Results/Tests Pending at Time of Discharge: N/A  Discharge Medications:  Allergies as of 01/24/2022       Reactions   Trulicity  [dulaglutide] Other (See Comments)   Patient indicated this caused pancreatitis.         Medication List     STOP taking these medications    chlorthalidone 25 MG tablet Commonly known as: HYGROTON   losartan 100 MG tablet Commonly known as: COZAAR       TAKE these medications    Accu-Chek Guide test strip Generic drug: glucose blood Use as instructed   Accu-Chek Softclix Lancets lancets USE AS DIRECTED UP TO FOUR TIMES DAILY   amLODipine 10 MG tablet Commonly known as: NORVASC TAKE 1 TABLET(10 MG) BY MOUTH AT BEDTIME What changed: See the new instructions.   aspirin EC 81 MG tablet Take 81 mg by mouth daily.   Basaglar KwikPen 100 UNIT/ML Inject 0.3 mLs (30 Units total) into the skin daily. What changed: how much to take   BD Pen Needle Nano 2nd Gen 32G X 4 MM Misc Generic drug: Insulin Pen Needle Inject 1 Container into the skin 3 times/day as needed-between meals & bedtime. USE AS DIRECTED DAILY  blood glucose meter kit and supplies Dispense based on patient and insurance preference. Use up to four times daily as directed. (FOR ICD-10 E10.9, E11.9).   carvedilol 12.5 MG tablet Commonly known as: COREG Take 1 tablet (12.5 mg total) by mouth 2 (two) times daily with a meal. What changed:  medication strength how much to take   empagliflozin 10 MG Tabs tablet Commonly known as: JARDIANCE Take 1 tablet (10 mg total) by mouth daily.   Fiasp FlexTouch 100 UNIT/ML FlexTouch Pen Generic drug: insulin aspart INJECT 10 UNITS INTO THE SKIN DAILY BEFORE SUPPER   lidocaine 5 % Commonly known as: LIDODERM Place 1 patch onto the skin daily. Remove & Discard patch within 12 hours or as directed by MD Start taking on: January 25, 2022   metFORMIN 500 MG tablet Commonly known as: GLUCOPHAGE TAKE 2 TABLETS(1000 MG) BY MOUTH TWICE DAILY WITH A MEAL What changed: See the new instructions.   polyethylene glycol 17 g packet Commonly known as: MIRALAX /  GLYCOLAX Take 17 g by mouth daily. Start taking on: January 25, 2022   rosuvastatin 40 MG tablet Commonly known as: CRESTOR TAKE 1 TABLET(40 MG) BY MOUTH DAILY What changed: See the new instructions.        Discharge Instructions: Please refer to Patient Instructions section of EMR for full details.  Patient was counseled important signs and symptoms that should prompt return to medical care, changes in medications, dietary instructions, activity restrictions, and follow up appointments.   Follow-Up Appointments:  Follow-up Information     Lilland, Alana, DO. Go on 02/02/2022.   Specialty: Family Medicine Why: $RemoveBefor'@10'TDbxOCXDwUbd$ :30am Contact information: [place] [address] [phone]         Deberah Pelton, NP Follow up on 01/31/2022.   Specialty: Cardiology Why: at 1005am with your cardiology Contact information: [address] [place] [address] [phone]         [facility] [place] Follow up on 01/26/2022.   Specialty: Cardiology Why: please arrive between 8-4 for your blood work BMP Contact information: [place] [phone] [place]. Go in 8 day(s).   Specialty: Cardiology Why: Hospital follow up PLEASE bring a current medication list to appointments FREE valet parking, Entrance C, off [employer] information: [address] [id number] Bertrand Hays, [place], MD 01/24/2022, 1:06 PM PGY-1, [place]  I was personally present and performed or re-performed the history, physical exam and medical decision making activities of this service and have verified that the service and findings are accurately documented in the resident's note.  Zola Button, MD                   01/24/2022, 1:10 PM

## 2022-01-24 NOTE — TOC Progression Note (Signed)
Transition of Care [facility]) - Progression Note    Patient Details  Name: [name] MRN: [id number] Date of Birth: [birth year]  Transition of Care [facility]) CM/SW Contact  Zenon Mayo, RN Phone Number: 01/24/2022, 1:27 PM  Clinical Narrative:    Patient is for dc today, has no needs.        Expected Discharge Plan and Services           Expected Discharge Date: 01/24/22                                     Social Determinants of Health (SDOH) Interventions Food Insecurity Interventions: Intervention Not Indicated Housing Interventions: Intervention Not Indicated Transportation Interventions: Intervention Not Indicated Utilities Interventions: Intervention Not Indicated Alcohol Usage Interventions: Intervention Not Indicated (Score <7) Financial Strain Interventions: Intervention Not Indicated  Readmission Risk Interventions     No data to display

## 2022-01-24 NOTE — Consult Note (Signed)
   [facility] CM Inpatient Consult   01/24/2022  [name] 07/03/1950 [id number]   [facility] [ACO] Patient: [name]   Primary Care Provider: [name], DO with [facility], is an  provider with access to a Care Management team and program and is listed for the [facility] follow up needs    Patient screened for needs for post hospital transition for readmission prevention needs.  Review of patient's medical record reveals patient is could benefit from diabetes follow up care coordination.  Noted A1C 8.9.  Met with the patient at the bedside and explained RN Care Coordination in the community setting.  Patient verbalized understanding and states to call his wife's phone, [name] at [phone] and gives approval to speak with her.   Plan:  Referral request made for post hospital care coordination.   For questions contact:    Natividad Brood, RN BSN [facility] Liaison  [phone] business mobile phone Toll free office [phone]  Fax number: [phone] [email or web address]

## 2022-01-24 NOTE — Progress Notes (Signed)
Heart Failure Nurse Navigator Progress Note  PCP: [name], DO PCP-Cardiologist: [facility] Ad[facility] Diagnosis: Acute left-sided thoracic back pain, elevated troponin.  Admitted from: Home  Presentation:   [name] presented with shortness of breath, chest pain, back pain x 3 weeks.  BP 190/109, HR 77,   Navigator asked to interview and make a hospital follow up appointment HF TOC. Patient was educated on the sign and symptoms of heart failure, daily weights, when to call his doctor or go to the ED. Diet/ fluid restrictions, taking all medications as prescribed and attending all medical appointments. 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 A hospital follow up HF TOC appointment was made for 02/01/22 @ 3pm at the request of Margie Billet, NP.   ECHO/ LVEF: 45-50% New   Clinical Course:  Past Medical History:  Diagnosis Date   Diabetes mellitus without complication ([place])    Hypercholesterolemia    Hypertension    MI (myocardial infarction) ([place])      Social History   Socioeconomic History   Marital status: Married    Spouse name: [name]   Number of children: 4   Years of education: 10   Highest education level: 10th grade  Occupational History   Occupation: retired  Tobacco Use   Smoking status: Former    Packs/day: 0.50    Years: 10.00    Total pack years: 5.00    Types: Cigarettes    Quit date: 04/25/1993    Years since quitting: 28.7   Smokeless tobacco: Never  Vaping Use   Vaping Use: Never used  Substance and Sexual Activity   Alcohol use: No   Drug use: No   Sexual activity: Yes    Birth control/protection: Post-menopausal  Other Topics Concern   Not on file  Social History Narrative   Patient lives with his wife in [place].   Patent has 4 boys, one lives in [place].    Patient enjoys fishing, camping in the woods,  working outside, anything outdoor related.   Social Determinants of Health   Financial Resource Strain: Low Risk  (01/24/2022)   Overall Financial Resource Strain (CARDIA)    Difficulty of Paying Living Expenses: Not hard at all  Food Insecurity: No Food Insecurity (01/24/2022)   Hunger Vital Sign    Worried About Running Out of Food in the Last Year: Never true    Ran Out of Food in the Last Year: Never true  Transportation Needs: No Transportation Needs (01/24/2022)   PRAPARE - Hydrologist (Medical): No    Lack of Transportation (Non-Medical): No  Physical Activity: Sufficiently Active (07/21/2020)   Exercise Vital Sign    Days of Exercise per Week: 3 days    Minutes of Exercise per Session: 50 min  Stress: No Stress Concern Present (07/21/2020)   [place]    Feeling of Stress : Not at all  Social Connections: Moderately Integrated (07/21/2020)   Social Connection and Isolation Panel [NHANES]    Frequency of Communication with Friends and Family: More than three times a week    Frequency of Social Gatherings with Friends and Family: More than three times a week    Attends Religious Services: More than 4 times per year    Active Member of Clubs or Organizations: No  Attends Archivist Meetings: Never    Marital Status: Married   Teacher, early years/pre and Provision:  Detailed education and instructions provided on heart failure disease management including the following:  Signs and symptoms of Heart Failure When to call the physician Importance of daily weights Low sodium diet Fluid restriction Medication management Anticipated future follow-up appointments  Patient education given on each of the above topics.  Patient acknowledges understanding via teach back method and acceptance of all instructions.  Education Materials:  "Living Better With Heart Failure" Booklet, HF  zone tool, & Daily Weight Tracker Tool.  Patient has scale at home: Yes Patient has pill box at home: NA    High Risk Criteria for Readmission and/or Poor Patient Outcomes: Heart failure hospital admissions (last 6 months):  0 No Show rate: 17 % Difficult social situation: No Demonstrates medication adherence: Yes Primary Language: English Literacy level: Reading, writing, and comprehension  Barriers of Care:   Diet/ fluid restrictions Daily weights  Considerations/Referrals:   Referral made to Heart Failure Pharmacist Stewardship: No Referral made to Heart Failure CSW/NCM TOC: No Referral made to Heart & Vascular TOC clinic: Yes, 02/01/22 @ 3 pm per request of Margie Billet, NP  Items for Follow-up on DC/TOC: Diet/ fluid restrictions Daily weights   Earnestine Leys, BSN, RN Heart Failure Transport planner Only

## 2022-01-24 NOTE — Assessment & Plan Note (Signed)
Echo with EF 45-50% and evidence of possible hypertensive cardiomyopathy. -Cardiology following, appreciate recs -Coreg increased to 12.5, losartan switched to Entresto, start Aldactone (held due to AKI) -Continue home Jardiance -S/p IV Lasix x1 -Cardiac monitoring

## 2022-01-24 NOTE — Progress Notes (Signed)
BMP ordered for Wednesday 01/26/22 at [facility] office, per Dr Dellia Cloud request.

## 2022-01-24 NOTE — Telephone Encounter (Signed)
Received a fax regarding Prior Authorization from [facility] for Lidocaine 5% patches.   Authorization has been DENIED   Key #   [id number]

## 2022-01-24 NOTE — Inpatient Diabetes Management (Signed)
Inpatient Diabetes Program Recommendations  AACE/ADA: New Consensus Statement on Inpatient Glycemic Control (2015)  Target Ranges:  Prepandial:   less than 140 mg/dL      Peak postprandial:   less than 180 mg/dL (1-2 hours)      Critically ill patients:  140 - 180 mg/dL    Latest Reference Range & Units 01/23/22 06:46 01/23/22 12:06 01/23/22 16:28 01/23/22 21:21  Glucose-Capillary 70 - 99 mg/dL 232 (H)  5 units Novolog  10 units Semglee '@0933'$   194 (H)  3 units Novolog  265 (H)  8 units Novolog  159 (H)  (H): Data is abnormally high  Latest Reference Range & Units 01/24/22 05:56 01/24/22 11:02  Glucose-Capillary 70 - 99 mg/dL 153 (H)  3 units Novolog  10 units Semglee '@0831'$   316 (H)  11 units Novolog   (H): Data is abnormally high    Home DM Meds: Basaglar 10 units Daily     Fiasp 10 units QDinner     Jardiance 10 mg Daily    Metformin 500 mg QHS    Current Orders: Semglee 10 units Daily  Novolog 0-15 units TID  Jardiance 10 mg Daily    MD- Note CBG 316 at Lunch today  Please consider starting Novolog Meal Coverage:  Novolog 4 units TID with meals  HOLD if pt NPO HOLD it pt eats <50% meals    --Will follow patient during hospitalization--  Wyn Quaker RN, MSN, [place] Diabetes Coordinator Inpatient Glycemic Control Team Team Pager: [phone] (8a-5p)

## 2022-01-24 NOTE — Progress Notes (Signed)
Discharge teaching complete. Meds, diet, activity, follow up appointments reviewed and all questions answered. Copy of instructions given to patient. TOC to bring medications.

## 2022-01-24 NOTE — Assessment & Plan Note (Addendum)
Cr 1.64 this morning from 1.18, in the setting of recent imaging with contrast, IV Lasix x1, started Aldactone yesterday. Holding off on fluids due to recent finding of mildly reduced EF. -Held Aldactone, entresto, and chlorthalidone -F/U cardiology recommendations as above

## 2022-01-24 NOTE — Progress Notes (Addendum)
Progress Note  Patient Name: [name] Date of Encounter: 01/24/2022  Primary Cardiologist: None  Subjective   No acute events overnight. Back pain resolved.  Inpatient Medications    Scheduled Meds:  amLODipine  10 mg Oral Daily   aspirin EC  81 mg Oral Daily   carvedilol  12.5 mg Oral BID WC   empagliflozin  10 mg Oral Daily   enoxaparin (LOVENOX) injection  40 mg Subcutaneous Q24H   insulin aspart  0-15 Units Subcutaneous TID WC   insulin glargine-yfgn  10 Units Subcutaneous Daily   lidocaine  1 patch Transdermal Q24H   polyethylene glycol  17 g Oral Daily   rosuvastatin  40 mg Oral Daily   Continuous Infusions:  PRN Meds:    Vital Signs    Vitals:   01/23/22 1326 01/23/22 1928 01/23/22 2350 01/24/22 0425  BP: (!) 167/98 (!) 137/95 (!) 101/41 (!) 145/84  Pulse: 93 83 75 82  Resp: 18   19  Temp: 98 F (36.7 C) 98 F (36.7 C) 98 F (36.7 C) 98 F (36.7 C)  TempSrc: Oral   Oral  SpO2: 100% 99% 95% 99%  Weight: 86.7 kg   88 kg    Intake/Output Summary (Last 24 hours) at 01/24/2022 1037 Last data filed at 01/24/2022 0830 Gross per 24 hour  Intake 460 ml  Output 300 ml  Net 160 ml   Filed Weights   01/23/22 1326 01/24/22 0425  Weight: 86.7 kg 88 kg    Telemetry     Personally reviewed, NSR  ECG    NSR  Physical Exam   GEN: No acute distress.   Neck: No JVD. Cardiac: RRR, no murmur, rub, or gallop.  Respiratory: Nonlabored. Clear to auscultation bilaterally. GI: Soft, nontender, bowel sounds present. MS: No edema; No deformity. Neuro:  Nonfocal. Psych: Alert and oriented x 3. Normal affect.  Labs    Chemistry Recent Labs  Lab 01/22/22 1556 01/23/22 0055 01/24/22 0106  NA 139 138 139  K 4.6 3.6 3.4*  CL 105 105 102  CO2 '25 25 27  '$ GLUCOSE 362* 291* 102*  BUN '18 15 20  '$ CREATININE 1.29* 1.18 1.64*  CALCIUM 9.1 8.9 9.4  PROT 6.9  --   --   ALBUMIN 3.5  --   --   AST 26  --   --   ALT 20  --   --   ALKPHOS 53  --   --    BILITOT 0.3  --   --   GFRNONAA 59* >60 44*  ANIONGAP '9 8 10     '$ Hematology Recent Labs  Lab 01/22/22 1556  WBC 9.5  RBC 4.91  HGB 14.2  HCT 44.0  MCV 89.6  MCH 28.9  MCHC 32.3  RDW 12.5  PLT 294    Cardiac Enzymes Recent Labs  Lab 01/22/22 1556 01/22/22 1724  TROPONINIHS 114* 121*    BNP Recent Labs  Lab 01/22/22 1556  BNP 91.7     DDimer Recent Labs  Lab 01/22/22 1724  DDIMER 0.66*    Cardiac Studies   Echo on 01/23/2022 Left ventricular ejection fraction, by estimation, is 45 to 50%. Left ventricular ejection fraction by PLAX is 46 %. The left ventricle has mildly decreased function. The left ventricle demonstrates global hypokinesis. There is mild left ventricular hypertrophy. Left ventricular diastolic parameters are consistent with Grade I diastolic dysfunction (impaired relaxation). Elevated left ventricular end-diastolic pressure. The E/e' is 16. Right ventricular systolic  function is mildly reduced. The right ventricular size is normal. The mitral valve is abnormal. Trivial mitral valve regurgitation. The aortic valve is tricuspid. Aortic valve regurgitation is not visualized. Aortic valve sclerosis is present, with no evidence of aortic valve stenosis. The inferior vena cava is normal in size with greater than 50% respiratory variability, suggesting right atrial pressure of 3 mmHg.   Assessment & Plan   Patient is a 71 y/o M known to have CAD s/p PCI in 2005 ([place]), HTN, DM2, HLD presented to the ER with chest pain. He was admitted to the medicine team for chest pain and cardiology was consulted for the same. Chest pain not similar to index pain.   #Non-cardiac chest pain, RESOLVED PLAN - Pain management per primary team - No indication for cardiac non-invasive testing   # Warm and dry / New onset of cardiomyopathy, LVEF 45% with no device likely secondary to uncontrolled HTN / NYHA II / ACC AHA Stage B, currently  compensated PLAN - Continue Coreg 12.'5mg'$  BID - Hold Entresto due to AKI. Outpatient BMP on 01/26/2022 - Continue Jardiance '10mg'$  once daily - Outpatient cardiology clinic appointment scheduled with Dr Debara Pickett in one week   #HTN, poorly controlled PLAN - Continue Coreg 12.'5mg'$  BID and Amlodipine '10mg'$  once daily - Hold Entresto and diuretic due to AKI. Restart Losartan as outpatient if renal function normalizes   #Hyperlipidemia, not at goal PLAN - Continue Rosuvastatin '40mg'$  QHS. LDL decreased from 141 in 08/2021 to 104 in 01/2022. Goal LDL < 70 mg/dL. Outpatient management of HLD.   #AKI likely secondary to contrast/drugs PLAN - Hold nephrotoxic agents - Encourage PO hydration   #CAD s/p PCI in 2005 PLAN - Continue cardioprudent medications, aspirin '81mg'$  once daily and Rosuvastatin '40mg'$  QHS. - Continue Coreg 12.'5mg'$  BID and hold Entresto in light of AKI   [facility] will sign off.   Medication Recommendations: Coreg 12.'5mg'$  BID, Amlodipine '10mg'$  once daily, Jardiance '10mg'$  once daily, aspirin '81mg'$  once daily, Rosuvastatin '40mg'$  QHS. Other recommendations (labs, testing, etc):  BMP on 01/26/2022. If renal function normalizes, resume Entresto outpatient Follow up as an outpatient:  One week with cardiology, Dr Debara Pickett    Signed, Vishnu Priya Mallipeddi  01/24/2022, 12:36Pm

## 2022-01-24 NOTE — TOC Progression Note (Signed)
Transition of Care [facility]) - Progression Note    Patient Details  Name: [name] MRN: [id number] Date of Birth: [birth year]  Transition of Care [facility] CM/SW Contact  Zenon Mayo, RN Phone Number: 01/24/2022, 9:02 AM  Clinical Narrative:    from home with wife, atypical chest pain, DM, CKD. TOC following.        Expected Discharge Plan and Services                                                 Social Determinants of Health (SDOH) Interventions    Readmission Risk Interventions     No data to display

## 2022-01-24 NOTE — Telephone Encounter (Signed)
Pharmacy Patient Advocate Encounter  Insurance verification completed.    The patient is insured through [facility] Part D   The patient is currently admitted and ran test claims for the following: Entresto.  Copays and coinsurance results were relayed to Inpatient clinical team.

## 2022-01-25 ENCOUNTER — Telehealth: Payer: Self-pay

## 2022-01-25 ENCOUNTER — Telehealth: Payer: Self-pay | Admitting: *Deleted

## 2022-01-25 NOTE — Chronic Care Management (AMB) (Signed)
  Care Coordination   Note   01/25/2022 Name: [name] MRN: [id number] DOB: 08-13-1950  [name] is a 71 y.o. year old male who sees Lilland, Alana, DO for primary care. I reached out to [name] by phone today to offer care coordination services.  [name] was given information about Care Coordination services today including:   The Care Coordination services include support from the care team which includes your Nurse Coordinator, Clinical Social Worker, or Pharmacist.  The Care Coordination team is here to help remove barriers to the health concerns and goals most important to you. Care Coordination services are voluntary, and the patient may decline or stop services at any time by request to their care team member.   Care Coordination Consent Status: Patient agreed to services and verbal consent obtained.   Follow up plan:  Telephone appointment with care coordination team member scheduled for:  02/03/22  Encounter Outcome:  Pt. Scheduled  [place]  Direct Dial: [phone]

## 2022-01-25 NOTE — Progress Notes (Unsigned)
Cardiology Clinic Note   Patient Name: [name] Date of Encounter: 01/31/2022  Primary Care Provider:  [name], DO Primary Cardiologist:  Pixie Casino, MD  Patient Profile    [name] presents to the clinic today for follow-up evaluation essential hypertension atypical angina and CHF.  Past Medical History    Past Medical History:  Diagnosis Date   Diabetes mellitus without complication ([place])    Hypercholesterolemia    Hypertension    MI (myocardial infarction) [facility])    Past Surgical History:  Procedure Laterality Date   CORONARY ANGIOPLASTY WITH STENT PLACEMENT  08/01/2003   Proximal LAD    Allergies  Allergies  Allergen Reactions   Trulicity [Dulaglutide] Other (See Comments)    Patient indicated this caused pancreatitis.     History of Present Illness    [name] is a PMH of HTN, atypical angina, CHF type 2 diabetes, AKI, HLD, and low-level literacy.  He had PCI to his LAD in 2005.  He was admitted to the hospital on 10 1 and discharged on 01/24/2022.  He presented with chest discomfort.  High-sensitivity troponins were flat and he did report history of dyspnea on exertion.  His chest x-ray and CTA were negative.  Cardiology was consulted.  No further testing was recommended.  His lipid panel showed an LDL of 104 compared to previously being 141.  His LDL goal is less than 70.  He was continued on his home aspirin and simvastatin.  His pain improved with lidocaine patch.  His pain was felt to be related to MSK.  His echocardiogram showed a newly reduced EF of 45-50%.  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  He was discharged on carvedilol, amlodipine, Jardiance, rosuvastatin and aspirin.  He was noted to be hypertensive during his hospitalization however no as needed antihypertensive medications were needed.  His home type 2 diabetes with medication was continued.  His A1c was noted to be 8.9.  He presents to the clinic today for  follow-up evaluation and states he continues to have left shoulder pain that wraps around through his axillary region.  He notices pain increases with movement.  We reviewed his recent emergency department visit.  He expressed understanding.  I reassured him that his pain is not related to his heart and was related to muscle pain/discomfort.  His blood pressure is slightly elevated initially today at 142/98 and on recheck is 138/80.  He reports that his blood pressure at home is routinely in the 588T systolic.  I will order physical therapy for his left upper extremity eval and treat, increase his carvedilol to 25 mg twice daily repeat echocardiogram in 3 months and plan follow-up for 4  months.   Today he  denies  shortness of breath, lower extremity edema, fatigue, palpitations, melena, hematuria, hemoptysis, diaphoresis, weakness, presyncope, and syncope.     Home Medications    Prior to Admission medications   Medication Sig Start Date End Date Taking? Authorizing Provider  Accu-Chek Softclix Lancets lancets USE AS DIRECTED UP TO FOUR TIMES DAILY 06/10/19   Shirley, [place], DO  amLODipine (NORVASC) 10 MG tablet TAKE 1 TABLET(10 MG) BY MOUTH AT BEDTIME Patient taking differently: Take 10 mg by mouth at bedtime. 04/28/21   Lilland, Alana, DO  aspirin EC 81 MG tablet Take 81 mg by mouth daily.    [provider]  blood glucose meter kit and supplies Dispense based on patient and insurance preference. Use up  to four times daily as directed. (FOR ICD-10 E10.9, E11.9). 05/13/20   Lilland, Alana, DO  carvedilol (COREG) 12.5 MG tablet Take 1 tablet (12.5 mg total) by mouth 2 (two) times daily with a meal. 01/24/22   [name], MD  empagliflozin (JARDIANCE) 10 MG TABS tablet Take 1 tablet (10 mg total) by mouth daily. 08/23/21   Lilland, Alana, DO  FIASP FLEXTOUCH 100 UNIT/ML FlexTouch Pen INJECT 10 UNITS INTO THE SKIN DAILY BEFORE SUPPER 10/15/21   Lilland, Alana, DO  glucose blood (ACCU-CHEK  GUIDE) test strip Use as instructed 05/21/20   Zenia Resides, MD  Insulin Glargine (BASAGLAR KWIKPEN) 100 UNIT/ML SOPN Inject 0.3 mLs (30 Units total) into the skin daily. Patient taking differently: Inject 10 Units into the skin daily. 11/15/18   Zenia Resides, MD  Insulin Pen Needle (BD PEN NEEDLE NANO 2ND GEN) 32G X 4 MM MISC Inject 1 Container into the skin 3 times/day as needed-between meals & bedtime. USE AS DIRECTED DAILY 09/10/21   Lilland, Alana, DO  lidocaine (LIDODERM) 5 % Place 1 patch onto the skin daily. Remove & Discard patch within 12 hours or as directed by MD 01/25/22   [name], MD  metFORMIN (GLUCOPHAGE) 500 MG tablet TAKE 2 TABLETS(1000 MG) BY MOUTH TWICE DAILY WITH A MEAL Patient taking differently: Take 500 mg by mouth at bedtime. 06/10/21   Lilland, Alana, DO  polyethylene glycol powder (GLYCOLAX/MIRALAX) 17 GM/SCOOP powder Take 1 capful (17 g) with water by mouth daily. 01/25/22   [name], MD  rosuvastatin (CRESTOR) 40 MG tablet TAKE 1 TABLET(40 MG) BY MOUTH DAILY Patient taking differently: Take 40 mg by mouth daily. 07/16/21   [name], DO    Family History    Family History  Problem Relation Age of Onset   Heart disease Mother    Diabetes Mother    Hypertension Mother    Alcoholism Father    Heart disease Father    Hypertension Sister    Diabetes Brother    Hypercholesterolemia Brother    Osteoporosis Brother    Hypertension Brother    Kidney disease Brother    He indicated that the status of his mother is unknown. He indicated that the status of his father is unknown. He indicated that the status of his sister is unknown. He indicated that the status of his brother is unknown.  Social History    Social History   Socioeconomic History   Marital status: Married    Spouse name: [name]   Number of children: 4   Years of education: 10   Highest education level: 10th grade  Occupational History   Occupation: retired  Tobacco Use    Smoking status: Former    Packs/day: 0.50    Years: 10.00    Total pack years: 5.00    Types: Cigarettes    Quit date: 04/25/1993    Years since quitting: 28.7   Smokeless tobacco: Never  Vaping Use   Vaping Use: Never used  Substance and Sexual Activity   Alcohol use: No   Drug use: No   Sexual activity: Yes    Birth control/protection: Post-menopausal  Other Topics Concern   Not on file  Social History Narrative   Patient lives with his wife in [place].   Patent has 4 boys, one lives in [place].    Patient enjoys fishing, camping in the woods, working outside, anything outdoor related.   Social Determinants of Health   Financial Resource Strain:  Low Risk  (01/24/2022)   Overall Financial Resource Strain (CARDIA)    Difficulty of Paying Living Expenses: Not hard at all  Food Insecurity: No Food Insecurity (01/24/2022)   Hunger Vital Sign    Worried About Running Out of Food in the Last Year: Never true    Ran Out of Food in the Last Year: Never true  Transportation Needs: No Transportation Needs (01/24/2022)   PRAPARE - Hydrologist (Medical): No    Lack of Transportation (Non-Medical): No  Physical Activity: Sufficiently Active (07/21/2020)   Exercise Vital Sign    Days of Exercise per Week: 3 days    Minutes of Exercise per Session: 50 min  Stress: No Stress Concern Present (07/21/2020)   [place]    Feeling of Stress : Not at all  Social Connections: Moderately Integrated (07/21/2020)   Social Connection and Isolation Panel [NHANES]    Frequency of Communication with Friends and Family: More than three times a week    Frequency of Social Gatherings with Friends and Family: More than three times a week    Attends Religious Services: More than 4 times per year    Active Member of [employer] or Organizations: No    Attends Archivist Meetings: Never    Marital Status:  Married  Human resources officer Violence: Not At Risk (07/21/2020)   Humiliation, Afraid, Rape, and Kick questionnaire    Fear of Current or Ex-Partner: No    Emotionally Abused: No    Physically Abused: No    Sexually Abused: No     Review of Systems    General:  No chills, fever, night sweats or weight changes.  Cardiovascular:  No chest pain, dyspnea on exertion, edema, orthopnea, palpitations, paroxysmal nocturnal dyspnea. Dermatological: No rash, lesions/masses Respiratory: No cough, dyspnea Urologic: No hematuria, dysuria Abdominal:   No nausea, vomiting, diarrhea, bright red blood per rectum, melena, or hematemesis Neurologic:  No visual changes, wkns, changes in mental status. All other systems reviewed and are otherwise negative except as noted above.  Physical Exam    VS:  BP 138/80   Pulse 97   Wt 199 lb (90.3 kg)   SpO2 98%   BMI 32.12 kg/m  , BMI Body mass index is 32.12 kg/m. GEN: Well nourished, well developed, in no acute distress. HEENT: normal. Neck: Supple, no JVD, carotid bruits, or masses. Cardiac: RRR, no murmurs, rubs, or gallops. No clubbing, cyanosis, edema.  Radials/DP/PT 2+ and equal bilaterally.  Respiratory:  Respirations regular and unlabored, clear to auscultation bilaterally. GI: Soft, nontender, nondistended, BS + x 4. MS: no deformity or atrophy.  Left scapular and axillary pain with decreased ROM Skin: warm and dry, no rash. Neuro:  Strength and sensation are intact. Psych: Normal affect.  Accessory Clinical Findings    Recent Labs: 01/22/2022: ALT 20; B Natriuretic Peptide 91.7; Hemoglobin 14.2; Platelets 294 01/23/2022: TSH 2.436 01/24/2022: Magnesium 2.2 01/26/2022: BUN 56; Creatinine, Ser 2.39; Potassium 4.7; Sodium 138   Recent Lipid Panel    Component Value Date/Time   CHOL 207 (H) 01/23/2022 0430   CHOL 243 (H) 08/23/2021 1458   TRIG 186 (H) 01/23/2022 0430   HDL 66 01/23/2022 0430   HDL 77 08/23/2021 1458   CHOLHDL 3.1 01/23/2022  0430   VLDL 37 01/23/2022 0430   LDLCALC 104 (H) 01/23/2022 0430   LDLCALC 141 (H) 08/23/2021 1458   LDLDIRECT 56 09/10/2020 0947  ECG personally reviewed by me today-none today.  Echocardiogram 01/23/2022  IMPRESSIONS     1. Left ventricular ejection fraction, by estimation, is 45 to 50%. Left  ventricular ejection fraction by PLAX is 46 %. The left ventricle has  mildly decreased function. The left ventricle demonstrates global  hypokinesis. There is mild left ventricular  hypertrophy. Left ventricular diastolic parameters are consistent with  Grade I diastolic dysfunction (impaired relaxation). Elevated left  ventricular end-diastolic pressure. The E/e' is 28.   2. Right ventricular systolic function is mildly reduced. The right  ventricular size is normal.   3. The mitral valve is abnormal. Trivial mitral valve regurgitation.   4. The aortic valve is tricuspid. Aortic valve regurgitation is not  visualized. Aortic valve sclerosis is present, with no evidence of aortic  valve stenosis.   5. The inferior vena cava is normal in size with greater than 50%  respiratory variability, suggesting right atrial pressure of 3 mmHg.   Comparison(s): No prior Echocardiogram.  Assessment & Plan   1.  New onset cardiomyopathy-no increased DOE or activity intolerance.  Felt to be related to hypertension. Increase carvedilol to 25 mg twice daily Continue Jardiance, amlodipine, rosuvastatin, aspirin Currently low-sodium high-fiber diet Increase physical activity as tolerated Plan for repeat echocardiogram in 3 months.  Atypical chest pain-no chest pain today.  Denies any further episodes of chest discomfort since being discharged from the hospital.  Underwent cardiac catheterization with PCI to his proximal LAD in 2005.  Recent admission showed low stable flat high-sensitivity troponins and was felt to be MSK in nature. Continue to monitor.  Coronary artery disease-remains  somewhat physically active.  Denies exertional chest discomfort.  Previous PCI in 2005 to his proximal LAD. Continue current medical therapy  Hyperlipidemia-01/23/2022: Cholesterol 207; HDL 66; LDL Cholesterol 104; Triglycerides 186; VLDL 37 Continue aspirin, rosuvastatin Start ezetimibe Heart healthy low-sodium high-fiber diet.   Increase physical activity as tolerated  Essential hypertension-BP today 138/80 Continue amlodipine, carvedilol Heart healthy low-sodium diet-salty 6 given Increase physical activity as tolerated Maintain log   Disposition: Follow-up with me in 4 months.   Jossie Ng. Cleaver NP-C     01/31/2022, 10:18 AM [place] [address] Office [phone] Fax [phone]  Notice: This dictation was prepared with Dragon dictation along with smaller phrase technology. Any transcriptional errors that result from this process are unintentional and may not be corrected upon review.  I spent 14 minutes examining this patient, reviewing medications, and using patient centered shared decision making involving her cardiac care.  Prior to her visit I spent greater than 20 minutes reviewing her past medical history,  medications, and prior cardiac tests.

## 2022-01-25 NOTE — Patient Outreach (Signed)
  Care Coordination [facility] Note Transition Care Management Unsuccessful Follow-up Telephone Call  Date of discharge and from where:  [name] 01/22/22-01/24/22  Attempts:  1st Attempt  Reason for unsuccessful TCM follow-up call:  Unable to leave message  Johnney Killian, RN, BSN, CCM Care Management Coordinator [facility] Phone: [phone]: [phone]

## 2022-01-26 ENCOUNTER — Other Ambulatory Visit: Payer: Self-pay

## 2022-01-26 ENCOUNTER — Other Ambulatory Visit: Payer: Self-pay | Admitting: Family Medicine

## 2022-01-26 DIAGNOSIS — I10 Essential (primary) hypertension: Secondary | ICD-10-CM

## 2022-01-26 DIAGNOSIS — Z5189 Encounter for other specified aftercare: Secondary | ICD-10-CM

## 2022-01-27 LAB — BASIC METABOLIC PANEL
BUN/Creatinine Ratio: 23 (ref 10–24)
BUN: 56 mg/dL — ABNORMAL HIGH (ref 8–27)
CO2: 20 mmol/L (ref 20–29)
Calcium: 9.8 mg/dL (ref 8.6–10.2)
Chloride: 98 mmol/L (ref 96–106)
Creatinine, Ser: 2.39 mg/dL — ABNORMAL HIGH (ref 0.76–1.27)
Glucose: 246 mg/dL — ABNORMAL HIGH (ref 70–99)
Potassium: 4.7 mmol/L (ref 3.5–5.2)
Sodium: 138 mmol/L (ref 134–144)
eGFR: 28 mL/min/{1.73_m2} — ABNORMAL LOW (ref >59)

## 2022-01-28 ENCOUNTER — Other Ambulatory Visit: Payer: Self-pay | Admitting: *Deleted

## 2022-01-31 ENCOUNTER — Ambulatory Visit: Payer: Medicare HMO | Attending: General Practice | Admitting: General Practice

## 2022-01-31 ENCOUNTER — Encounter: Payer: Self-pay | Admitting: General Practice

## 2022-01-31 VITALS — BP 138/80 | HR 97 | Wt 199.0 lb

## 2022-01-31 DIAGNOSIS — I2510 Atherosclerotic heart disease of native coronary artery without angina pectoris: Secondary | ICD-10-CM | POA: Diagnosis not present

## 2022-01-31 DIAGNOSIS — M898X1 Other specified disorders of bone, shoulder: Secondary | ICD-10-CM

## 2022-01-31 DIAGNOSIS — I2089 Other forms of angina pectoris: Secondary | ICD-10-CM

## 2022-01-31 DIAGNOSIS — I428 Other cardiomyopathies: Secondary | ICD-10-CM | POA: Diagnosis not present

## 2022-01-31 DIAGNOSIS — E782 Mixed hyperlipidemia: Secondary | ICD-10-CM | POA: Diagnosis not present

## 2022-01-31 DIAGNOSIS — I10 Essential (primary) hypertension: Secondary | ICD-10-CM | POA: Diagnosis not present

## 2022-01-31 MED ORDER — CARVEDILOL 12.5 MG PO TABS: 12.5000 mg | ORAL_TABLET | Freq: Two times a day (BID) | ORAL | 4 refills | Status: DC

## 2022-01-31 NOTE — Patient Instructions (Signed)
Medication Instructions:  INCREASE CARVEDILOL '25MG'$  TWICE DAILY *If you need a refill on your cardiac medications before your next appointment, please call your pharmacy*  Lab Work: NONE If you have any lab test that is abnormal or we need to change your treatment, we will call you to review the results.  Testing/Procedures: Echocardiogram (in 3 months)- Your physician has requested that you have an echocardiogram. Echocardiography is a painless test that uses sound waves to create images of your heart. It provides your doctor with information about the size and shape of your heart and how well your heart's chambers and valves are working. This procedure takes approximately one hour. There are no restrictions for this procedure.   Follow-Up: At [facility], you and your health needs are our priority.  As part of our continuing mission to provide you with exceptional heart care, we have created designated Provider Care Teams.  These Care Teams include your primary Cardiologist (physician) and Advanced Practice Providers (APPs -  Physician Assistants and Nurse Practitioners) who all work together to provide you with the care you need, when you need it.  Other Instructions REFER TO PHYSICAL THERAPY-LEFT SCAPULA PAIN  PLEASE REST, MAY USE ICE/HEAT TO LEFT SCAPULA. MAY ALSO USE VOLTAREN GEL (THIS IS OVER-THE-COUNTER)  PLEASE READ AND FOLLOW ATTACHED  SALTY 6   Your next appointment:   3-4 month(s)  The format for your next appointment:   In Person  Provider:   Pixie Casino, MD  or Coletta Memos, FNP        Important Information About Sugar

## 2022-02-01 ENCOUNTER — Ambulatory Visit (HOSPITAL_COMMUNITY)
Admit: 2022-02-01 | Discharge: 2022-02-01 | Disposition: A | Discharge status: Home / Self Care | Payer: Medicare HMO | Attending: Physician Assistant | Admitting: Physician Assistant

## 2022-02-01 ENCOUNTER — Encounter (HOSPITAL_COMMUNITY): Payer: Self-pay

## 2022-02-01 ENCOUNTER — Telehealth (HOSPITAL_COMMUNITY): Payer: Self-pay

## 2022-02-01 ENCOUNTER — Telehealth: Payer: Self-pay | Admitting: *Deleted

## 2022-02-01 VITALS — BP 160/100 | HR 104 | Wt 198.4 lb

## 2022-02-01 DIAGNOSIS — I130 Hypertensive heart and chronic kidney disease with heart failure and stage 1 through stage 4 chronic kidney disease, or unspecified chronic kidney disease: Secondary | ICD-10-CM | POA: Diagnosis not present

## 2022-02-01 DIAGNOSIS — Z79899 Other long term (current) drug therapy: Secondary | ICD-10-CM | POA: Diagnosis not present

## 2022-02-01 DIAGNOSIS — Z7984 Long term (current) use of oral hypoglycemic drugs: Secondary | ICD-10-CM | POA: Diagnosis not present

## 2022-02-01 DIAGNOSIS — I2510 Atherosclerotic heart disease of native coronary artery without angina pectoris: Secondary | ICD-10-CM | POA: Insufficient documentation

## 2022-02-01 DIAGNOSIS — Z7982 Long term (current) use of aspirin: Secondary | ICD-10-CM | POA: Diagnosis not present

## 2022-02-01 DIAGNOSIS — Z955 Presence of coronary angioplasty implant and graft: Secondary | ICD-10-CM | POA: Diagnosis not present

## 2022-02-01 DIAGNOSIS — E7800 Pure hypercholesterolemia, unspecified: Secondary | ICD-10-CM | POA: Diagnosis not present

## 2022-02-01 DIAGNOSIS — R000 Tachycardia, unspecified: Secondary | ICD-10-CM | POA: Insufficient documentation

## 2022-02-01 DIAGNOSIS — I252 Old myocardial infarction: Secondary | ICD-10-CM | POA: Diagnosis not present

## 2022-02-01 DIAGNOSIS — I5022 Chronic systolic (congestive) heart failure: Secondary | ICD-10-CM

## 2022-02-01 DIAGNOSIS — Z794 Long term (current) use of insulin: Secondary | ICD-10-CM | POA: Diagnosis not present

## 2022-02-01 DIAGNOSIS — I5020 Unspecified systolic (congestive) heart failure: Secondary | ICD-10-CM | POA: Insufficient documentation

## 2022-02-01 DIAGNOSIS — I454 Nonspecific intraventricular block: Secondary | ICD-10-CM | POA: Insufficient documentation

## 2022-02-01 DIAGNOSIS — I429 Cardiomyopathy, unspecified: Secondary | ICD-10-CM | POA: Diagnosis not present

## 2022-02-01 DIAGNOSIS — N1831 Chronic kidney disease, stage 3a: Secondary | ICD-10-CM | POA: Insufficient documentation

## 2022-02-01 DIAGNOSIS — I10 Essential (primary) hypertension: Secondary | ICD-10-CM | POA: Diagnosis not present

## 2022-02-01 DIAGNOSIS — E1122 Type 2 diabetes mellitus with diabetic chronic kidney disease: Secondary | ICD-10-CM | POA: Diagnosis not present

## 2022-02-01 DIAGNOSIS — N179 Acute kidney failure, unspecified: Secondary | ICD-10-CM | POA: Diagnosis not present

## 2022-02-01 LAB — BASIC METABOLIC PANEL
Anion gap: 11 (ref 5–15)
BUN: 22 mg/dL (ref 8–23)
CO2: 23 mmol/L (ref 22–32)
Calcium: 9.3 mg/dL (ref 8.9–10.3)
Chloride: 105 mmol/L (ref 98–111)
Creatinine, Ser: 1.55 mg/dL — ABNORMAL HIGH (ref 0.61–1.24)
GFR, Estimated: 48 mL/min — ABNORMAL LOW (ref >60)
Glucose, Bld: 302 mg/dL — ABNORMAL HIGH (ref 70–99)
Potassium: 4.3 mmol/L (ref 3.5–5.1)
Sodium: 139 mmol/L (ref 135–145)

## 2022-02-01 NOTE — Telephone Encounter (Signed)
Left message for patient to call back  Needs repeat BMET (see result note) --OV 10/9-labs not completed

## 2022-02-01 NOTE — Telephone Encounter (Signed)
Call attempted to confirm HV TOC appt today at 3pm. HIPPA appropriate VM left with callback number.   Jezreel Sisk, MSN, RN Heart Failure Nurse Navigator  

## 2022-02-01 NOTE — Progress Notes (Addendum)
HEART & VASCULAR TRANSITION OF CARE CONSULT NOTE     Referring Physician: Dr. Thompson Grayer Primary Care: [facility] Primary Cardiologist: Dr. Debara Pickett  HPI: Referred to clinic by Dr. Thompson Grayer with [facility] Medicine teaching service for heart failure consultation. [name] is a 71 y.o. male with history of CAD s/p MI and PCI/stent to LAD in '05, DM II, HLD, HTN, obesity. Had not previously followed with Cardiology on a regular basis.  Presented to ED 01/22/22 with complaints of left upper back pain. Symptoms different than prior angina and were exacerbated by palpation and raising arms. HS troponin 114>121. BNP 91. CTA C/A/P negative for dissection. Cardiology consulted. CP felt to be likely noncardiac. Elevated troponin thought to be d/t uncontrolled HTN. Echo: EF 45-50%, RV mildly reduced. Cardiomyopathy most likely hypertensive in etiology. Coreg increased, losartan switched to entresto, and aldactone added. Given 40 mg lasix IV X 1. Subsequently developed AKI, diuretic, entresto and spiro stopped.  Scr up further to 2.4 on 10/04. Cardiology discontinued Jardiance.  He saw Cardiology for f/u yesterday. Coreg increased. Will f/u with echo in 3 months.  Here today for hospital follow-up. Still having some left upper back and shoulder pain that he believes is d/t musculoskeletal injury. Denies any CP suggestive of angina. Has been mowing his lawn with a push mower and walking outdoors with no significant dyspnea. Denies orthopnea, PND or LE edema.   Home BP tends to average around 761 systolic. BP quite elevated today but hasn't taken his medications. Admits to using salt shaker at times, wife has been helping him reduce sodium intake. Does not drink alcohol or use tobacco.  Cardiac Testing    Review of Systems: [y] = yes, _0  = no   General: Weight gain _1 ; Weight loss _2 ; Anorexia _3 ; Fatigue _4 ; Fever _5 ; Chills _6 ; Weakness _7   Cardiac: Chest pain/pressure _8 ; Resting SOB _9 ;  Exertional SOB _10 ; Orthopnea _11 ; Pedal Edema _12 ; Palpitations _13 ; Syncope _14 ; Presyncope _15 ; Paroxysmal nocturnal dyspnea_16   Pulmonary: Cough _17 ; Wheezing_18 ; Hemoptysis_19 ; Sputum _20 ; Snoring _21   GI: Vomiting_22 ; Dysphagia_23 ; Melena_24 ; Hematochezia _25 ; Heartburn_26 ; Abdominal pain _27 ; Constipation _28 ; Diarrhea _29 ; BRBPR _30   GU: Hematuria_31 ; Dysuria _32 ; Nocturia_33   Vascular: Pain in legs with walking _34 ; Pain in feet with lying flat _35 ; Non-healing sores _36 ; Stroke _37 ; TIA _38 ; Slurred speech _39 ;  Neuro: Headaches_40 ; Vertigo_41 ; Seizures_42 ; Paresthesias_43 ;Blurred vision _44 ; Diplopia _45 ; Vision changes _46   Ortho/Skin: Arthritis _47 ; Joint pain _48 ; Muscle pain _49 ; Joint swelling _50 ; Back Pain _51 ; Rash _52   Psych: Depression_53 ; Anxiety_54   Heme: Bleeding problems _55 ; Clotting disorders _56 ; Anemia _57   Endocrine: Diabetes [Y]; Thyroid dysfunction_58    Past Medical History:  Diagnosis Date   Diabetes mellitus without complication (HCC)    Hypercholesterolemia    Hypertension    MI (myocardial infarction) ([place])     Current Outpatient Medications  Medication Sig Dispense Refill   Accu-Chek Softclix Lancets lancets USE AS DIRECTED UP TO FOUR TIMES DAILY 100 each 12   amLODipine (NORVASC) 10 MG tablet TAKE 1 TABLET(10 MG) BY MOUTH AT BEDTIME 90 tablet 0   aspirin EC 81 MG tablet Take 81 mg by mouth  daily.     blood glucose meter kit and supplies Dispense based on patient and insurance preference. Use up to four times daily as directed. (FOR ICD-10 E10.9, E11.9). 1 each 0   carvedilol (COREG) 12.5 MG tablet Take 1 tablet (12.5 mg total) by mouth 2 (two) times daily with a meal. (Patient taking differently: Take 25 mg by mouth 2 (two) times daily with a meal.) 60 tablet 4   FIASP FLEXTOUCH 100 UNIT/ML FlexTouch Pen INJECT 10 UNITS INTO THE SKIN DAILY BEFORE SUPPER 15 mL 1   glucose blood (ACCU-CHEK GUIDE) test strip Use as instructed 100 each 12   Insulin  Glargine (BASAGLAR KWIKPEN) 100 UNIT/ML SOPN Inject 0.3 mLs (30 Units total) into the skin daily. (Patient taking differently: Inject 10 Units into the skin daily.) 5 pen 11   Insulin Pen Needle (BD PEN NEEDLE NANO 2ND GEN) 32G X 4 MM MISC Inject 1 Container into the skin 3 times/day as needed-between meals & bedtime. USE AS DIRECTED DAILY 100 each 2   lidocaine (LIDODERM) 5 % Place 1 patch onto the skin daily. Remove & Discard patch within 12 hours or as directed by MD 30 patch 0   metFORMIN (GLUCOPHAGE) 500 MG tablet TAKE 2 TABLETS(1000 MG) BY MOUTH TWICE DAILY WITH A MEAL (Patient taking differently: Take 1,000 mg by mouth 2 (two) times daily with a meal.) 360 tablet 3   polyethylene glycol powder (GLYCOLAX/MIRALAX) 17 GM/SCOOP powder Take 1 capful (17 g) with water by mouth daily. 238 g 0   rosuvastatin (CRESTOR) 40 MG tablet TAKE 1 TABLET(40 MG) BY MOUTH DAILY (Patient taking differently: Take 40 mg by mouth daily.) 90 tablet 3   No current facility-administered medications for this encounter.    Allergies  Allergen Reactions   Trulicity [Dulaglutide] Other (See Comments)    Patient indicated this caused pancreatitis.       Social History   Socioeconomic History   Marital status: Married    Spouse name: [name]   Number of children: 4   Years of education: 10   Highest education level: 10th grade  Occupational History   Occupation: retired  Tobacco Use   Smoking status: Former    Packs/day: 0.50    Years: 10.00    Total pack years: 5.00    Types: Cigarettes    Quit date: 04/25/1993    Years since quitting: 28.7   Smokeless tobacco: Never  Vaping Use   Vaping Use: Never used  Substance and Sexual Activity   Alcohol use: No   Drug use: No   Sexual activity: Yes  Other Topics Concern   Not on file  Social History Narrative   Patient lives with his wife in [place].   Patent has 4 boys, one lives in [place].    Patient enjoys fishing, camping in the woods, working  outside, anything outdoor related.   Social Determinants of Health   Financial Resource Strain: Low Risk  (01/24/2022)   Overall Financial Resource Strain (CARDIA)    Difficulty of Paying Living Expenses: Not hard at all  Food Insecurity: No Food Insecurity (01/24/2022)   Hunger Vital Sign    Worried About Running Out of Food in the Last Year: Never true    Ran Out of Food in the Last Year: Never true  Transportation Needs: No Transportation Needs (01/24/2022)   PRAPARE - Hydrologist (Medical): No    Lack of Transportation (Non-Medical): No  Physical Activity: Sufficiently Active (  07/21/2020)   Exercise Vital Sign    Days of Exercise per Week: 3 days    Minutes of Exercise per Session: 50 min  Stress: No Stress Concern Present (07/21/2020)   Jay    Feeling of Stress : Not at all  Social Connections: Moderately Integrated (07/21/2020)   Social Connection and Isolation Panel [NHANES]    Frequency of Communication with Friends and Family: More than three times a week    Frequency of Social Gatherings with Friends and Family: More than three times a week    Attends Religious Services: More than 4 times per year    Active Member of [employer] or Organizations: No    Attends Archivist Meetings: Never    Marital Status: Married  Human resources officer Violence: Not At Risk (07/21/2020)   Humiliation, Afraid, Rape, and Kick questionnaire    Fear of Current or Ex-Partner: No    Emotionally Abused: No    Physically Abused: No    Sexually Abused: No      Family History  Problem Relation Age of Onset   Heart disease Mother    Diabetes Mother    Hypertension Mother    Alcoholism Father    Heart disease Father    Hypertension Sister    Diabetes Brother    Hypercholesterolemia Brother    Osteoporosis Brother    Hypertension Brother    Kidney disease Brother     Vitals:   02/01/22  1507  BP: (!) 160/100  Pulse: (!) 104  SpO2: 98%  Weight: 90 kg (198 lb 6.4 oz)    PHYSICAL EXAM: General:  Well appearing. Ambulated into clinic. HEENT: normal Neck: supple. no JVD. Carotids 2+ bilat; no bruits.  Cor: PMI nondisplaced. Regular rate & rhythm, tachy. No rubs, gallops or murmurs. Lungs: clear Abdomen: soft, nontender, nondistended.  Extremities: no cyanosis, clubbing, rash, edema Neuro: alert & oriented x 3, cranial nerves grossly intact. moves all 4 extremities w/o difficulty. Affect pleasant.  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 BP not controlled during recent admit. Had mildly elevated troponin with flat trend. Upper back/shoulder pain likely musculoskeletal, no ischemic CP. -Echo, 01/23/22: EF 45-50%, mild LVH, RV mildly reduced -NYHA II. Volume looks good. Not requiring diuretic. -Continue Coreg 25 BID (increased by Cardiology yesterday) -Lisabeth Register and Arlyce Harman recently stopped d/t AKI. Had also received contrast recently for CT. -Check BMET today. Add back GDMT as renal function allows -Consider sleep study -Reinforced importance of low-sodium diet -Has repeat echo scheduled in 3 months  2. CAD: -Hx MI in 2005. S/p PCI/stent to LAD -On aspirin and atorvastatin 40.  -Management of lipids per Cardiology  3. HTN:  -BP not at goal. Significantly elevated today but has not taken his medications -Continue coreg and amlodipine. -Can stop amlodipine in future if needed to allow room to titrate GDMT for CHF -Encouraged regular exercise. -Consider sleep study as above  4. DM II: -Not controlled. Recent A1c 8.9%. -On insulin and metformin -Off Farxiga as above  5. AKI on CKD IIIa: -Baseline Scr around 1.2-1.4, recently up to 2.4 -Labs today -AKI likely d/t contrast +/- multiple new meds   NYHA II GDMT  Diuretic-N/a BB-Coreg 25 BID Ace/ARB/ARNI-No, AKI MRA-No,  AKI SGLT2i-No, AKI    Referred to HFSW (PCP, Medications, Transportation, ETOH Abuse, Drug Abuse, Insurance, Financial ): Yes or No Refer to Pharmacy: No Refer  to Home Health: No Refer to Advanced Heart Failure Clinic: No Refer to General Cardiology: No, already established and has f/u  Follow up: 4 weeks in [facility] clinic for medication titration, then as needed

## 2022-02-01 NOTE — Patient Instructions (Signed)
There has been no changes to your medications.  Labs done today, your results will be available in MyChart, we will contact you for abnormal readings.  Thank you for allowing [place] to provider your heart failure care after your recent hospitalization. Please follow-up in 4 weeks.  If you have any questions, issues, or concerns before your next appointment please call our office at [phone], opt. 2 and leave a message for the triage nurse.

## 2022-02-02 ENCOUNTER — Ambulatory Visit (INDEPENDENT_AMBULATORY_CARE_PROVIDER_SITE_OTHER): Payer: Medicare HMO | Admitting: Family Medicine

## 2022-02-02 ENCOUNTER — Encounter: Payer: Self-pay | Admitting: Family Medicine

## 2022-02-02 VITALS — BP 147/78 | HR 83 | Ht 65.0 in | Wt 202.4 lb

## 2022-02-02 DIAGNOSIS — E119 Type 2 diabetes mellitus without complications: Secondary | ICD-10-CM

## 2022-02-02 DIAGNOSIS — M546 Pain in thoracic spine: Secondary | ICD-10-CM | POA: Diagnosis not present

## 2022-02-02 DIAGNOSIS — I10 Essential (primary) hypertension: Secondary | ICD-10-CM

## 2022-02-02 MED ORDER — BACLOFEN 10 MG PO TABS: 10.0000 mg | ORAL_TABLET | Freq: Every evening | ORAL | 0 refills | Status: DC | PRN

## 2022-02-02 MED ORDER — EMPAGLIFLOZIN 10 MG PO TABS: 10.0000 mg | ORAL_TABLET | Freq: Every day | ORAL | 3 refills | Status: DC

## 2022-02-02 MED ORDER — FIASP FLEXTOUCH 100 UNIT/ML ~~LOC~~ SOPN: 10.0000 [IU] | PEN_INJECTOR | Freq: Every day | SUBCUTANEOUS | 1 refills | Status: DC

## 2022-02-02 NOTE — Patient Instructions (Addendum)
Today we discussed the following:  Diabetes: - I am restarting her Jardiance 10 mg daily - I want you to check your blood sugars in the morning, if your fasting blood sugar is > 150, I want you to increase your Basaglar (the one you take during the day) by 1 unit. - I have refilled your insulin that you take with your supper  Back pain: - I think this is related to your muscle/rib not quite being in place - I am sending in for baclofen that you can take at night as needed. - The prescription is for 10 mg of baclofen, I want you to cut this in half and only use 5 mg - I am referring you to a sports medicine physician who I think can help with adjusting your back  Kidney injury - Since I am adding back one of your medications today, I want to see you in no later than 2 weeks to repeat your labs

## 2022-02-02 NOTE — Progress Notes (Signed)
    SUBJECTIVE:   CHIEF COMPLAINT / HPI:   Hospital follow-up Recommendation Please ensure f/u with cardiology 1 week after discharge Please recheck BMP on 10/4. Pt had an AKI towards the end of his stay and his Entresto, Aldactone, and chlorthalidone were held, can restart once renal function normalizes Ensure adherence to medication regimen F/u T2DM management, pt's A1c was 8.9 and blood glucose was relatively stable in the 100-200s. Had some hyperglycemia into the 300s. No changes were made to his home regimen on discharge Goal LDL is <70, continued on home Crestor during hospital stay Patient started on daily MiraLAX during his stay for reported chronic constipation, may follow-up outpatient  Shoulder pain - Keeps him awake at night - Thought it was a muscle but has not improved with Tylenol, ice, or heat - 3 weeks duration and worsened during the hospital - Worsened with certain movements - Notes he was recently working on his car and wonders if that started the pain  HTN - stable, follows cardiology - Home measurement was in the 130s and was 138 at last cardiology visit  T2DM - checking intermittently, this morning it was 203 - Not sure if he would be agreeable to CGM or not, seems hesitant  PERTINENT  PMH / PSH: Reviewed  OBJECTIVE:   BP (!) 147/78   Pulse 83   Ht '5\' 5"'$  (1.651 m)   Wt 202 lb 6.4 oz (91.8 kg)   SpO2 100%   BMI 33.68 kg/m   General: NAD, well-appearing, well-nourished Respiratory: No respiratory distress, breathing comfortably, able to speak in full sentences Skin: warm and dry, no rashes noted on exposed skin Psych: Appropriate affect and mood MSK: TTP over the Left sided T3-T5 and ribs.   ASSESSMENT/PLAN:   Type 2 diabetes mellitus without complication, without long-term current use of insulin (HCC) - restart jardiance '10mg'$  daily  - Increase basal insulin by 1Unit everyday with fasting cbg>150 - Discussed hypoglycemic symptoms and to decrease  insulin if experiencing any - Recheck BMP in 1-2 weeks - Continue CGM discussion at next visit   Essential hypertension, benign BP slightly above goal at 147/78. Titrating some medications with cardiology. Will restart Jardiance today and hope that offers some improvement as well - 1 week follow-up   Subacute left thoracic pain Pain with attempts at gentle manipulation of the ribs and T spine. Feel patient would benefit from osteopathic sports medicine evaluation as well as muscle relaxer to help with sleep.  - Baclofen '5mg'$  QHS PRN  - Referral to sports medicine placed.   [name], [place]

## 2022-02-03 ENCOUNTER — Ambulatory Visit: Payer: Self-pay

## 2022-02-03 NOTE — Assessment & Plan Note (Signed)
BP slightly above goal at 147/78. Titrating some medications with cardiology. Will restart Jardiance today and hope that offers some improvement as well - 1 week follow-up

## 2022-02-03 NOTE — Patient Instructions (Signed)
Visit Information  Thank you for taking time to visit with me today. Please don't hesitate to contact me if I can be of assistance to you.   Following are the goals we discussed today:   Goals Addressed             This Visit's Progress    I want to manage my chronic conditions       Care Coordination Interventions: Provided education to patient about basic DM disease process Reviewed medications with patient and discussed importance of medication adherence Counseled on importance of regular laboratory monitoring as prescribed Discussed plans with patient for ongoing care management follow up and provided patient with direct contact information for care management team Provided patient with written educational materials related to hypo and hyperglycemia and importance of correct treatment Advised patient, providing education and rationale, to check cbg and record, calling the office for findings outside established parameters Review of patient status, including review of consultants reports, relevant laboratory and other test results, and medications completed Active listening / Reflection utilized  Emotional Support Provided Problem [place] strategies reviewed Today, I had a conversation with Mr. and Mrs. Yeske regarding Mr. Routh shoulder pain, which he rated at 8 out of 10. He is currently using baclofen 5 mg and Voltern gel to manage the pain. Additionally, Mr. Harton has an appointment scheduled with a physical therapist on October 18th, 2023, at 3:00 PM.  During our conversation, we also discussed Mr. Trost A1c and its significance, along with testing, food choices, exercise, and the protocol to follow in case of low blood sugar. I am planning to send Mr. Gardella the Diabetes booklet and Carbs in Drinks paper before our subsequent discussion, and this will allow [place] to go through them together and address any questions or concerns that arise.         Our next appointment  is by telephone on 10/26 at 115 pm  Please call the care guide team at [phone] if you need to cancel or reschedule your appointment.   If you are experiencing a Mental Health or [place] or need someone to talk to, please call 1-800-273-TALK (toll free, 24 hour hotline)  Patient verbalizes understanding of instructions and care plan provided today and agrees to view in [place]. Active MyChart status and patient understanding of how to access instructions and care plan via MyChart confirmed with patient.     Lazaro Arms RN, BSN, [facility]   Phone: [phone]

## 2022-02-03 NOTE — Assessment & Plan Note (Addendum)
-   restart jardiance '10mg'$  daily  - Increase basal insulin by 1Unit everyday with fasting cbg>150 - Discussed hypoglycemic symptoms and to decrease insulin if experiencing any - Recheck BMP in 1-2 weeks - Continue CGM discussion at next visit

## 2022-02-03 NOTE — Patient Outreach (Signed)
  Care Coordination   Initial Visit Note   02/03/2022 Name: [name] MRN: [id number] DOB: 09-26-1950  [name] is a 71 y.o. year old male who sees Nicholas, Alana, DO for primary care. I spoke with  [name] by phone today.  What matters to the patients health and wellness today?  I need to lower my A1c    Goals Addressed             This Visit's Progress    I want to manage my chronic conditions       Care Coordination Interventions: Provided education to patient about basic DM disease process Reviewed medications with patient and discussed importance of medication adherence Counseled on importance of regular laboratory monitoring as prescribed Discussed plans with patient for ongoing care management follow up and provided patient with direct contact information for care management team Provided patient with written educational materials related to hypo and hyperglycemia and importance of correct treatment Advised patient, providing education and rationale, to check cbg and record, calling the office for findings outside established parameters Review of patient status, including review of consultants reports, relevant laboratory and other test results, and medications completed Active listening / Reflection utilized  Emotional Support Provided Problem [place] strategies reviewed Today, I had a conversation with Mr. and Mrs. Francis regarding [name] shoulder pain, which he rated at 8 out of 10. He is currently using baclofen 5 mg and Voltern gel to manage the pain. Additionally, [name] has an appointment scheduled with a physical therapist on October 18th, 2023, at 3:00 PM.  During our conversation, we also discussed [name] A1c and its significance, along with testing, food choices, exercise, and the protocol to follow in case of low blood sugar. I am planning to send [name] the Diabetes booklet and Carbs in Drinks paper before our subsequent discussion, and  this will allow [place] to go through them together and address any questions or concerns that arise.         SDOH assessments and interventions completed:  Yes  SDOH Interventions Today    Flowsheet Row Most Recent Value  SDOH Interventions   Food Insecurity Interventions Intervention Not Indicated  Transportation Interventions Intervention Not Indicated        Care Coordination Interventions Activated:  Yes  Care Coordination Interventions:  Yes, provided   Follow up plan: Follow up call scheduled for 02/17/22 115 pm    Encounter Outcome:  Pt. Visit Completed   Lazaro Arms RN, BSN, [facility]   Phone: [phone]

## 2022-02-07 NOTE — Therapy (Addendum)
OUTPATIENT PHYSICAL THERAPY SHOULDER EVALUATION / Discharge   Patient Name: [name] MRN: [id number] DOB:Apr 08, 1951, 71 y.o., male Today's Date: 02/09/2022   PT End of Session - 02/09/22 1449     Visit Number 1    Number of Visits 13    Date for PT Re-Evaluation 04/06/22    Authorization Type humana medicare    Authorization Time Period TBD    Progress Note Due on Visit 10    PT Start Time 1458    PT Stop Time 1545    PT Time Calculation (min) 47 min    Activity Tolerance No increased pain    Behavior During Therapy WFL for tasks assessed/performed             Past Medical History:  Diagnosis Date   Diabetes mellitus without complication ([place])    Hypercholesterolemia    Hypertension    MI (myocardial infarction) ([place])    Past Surgical History:  Procedure Laterality Date   CORONARY ANGIOPLASTY WITH STENT PLACEMENT  08/01/2003   Proximal LAD   Patient Active Problem List   Diagnosis Date Noted   AKI (acute kidney injury) ([place]) 01/24/2022   Heart failure with mildly reduced ejection fraction ([place]) 01/24/2022   Hypokalemia 01/24/2022   Non-cardiac chest pain    Coronary artery disease involving native coronary artery of native heart without angina pectoris    Hyperlipidemia    Atypical angina 01/22/2022   Benign paroxysmal positional vertigo 09/16/2020   Low-level of literacy 05/21/2018   Globus sensation 03/12/2018   Type 2 diabetes mellitus without complication, without long-term current use of insulin ([place]) 06/09/2017   Pure hypercholesterolemia 06/09/2017   History of coronary artery stent placement 06/09/2017   Essential hypertension, benign 06/09/2017    PCP: [name], DO  REFERRING PROVIDER: Deberah Pelton, NP  REFERRING DIAG: [phone] (ICD-10-CM) - Pain of left scapula  THERAPY DIAG:  Left shoulder pain, unspecified chronicity  Stiffness of left shoulder, not elsewhere classified  Muscle weakness (generalized)  Abnormal  posture  Rationale for Evaluation and Treatment Rehabilitation  ONSET DATE: 3-4 weeks ago  SUBJECTIVE:                                                                                                                                                                                      SUBJECTIVE STATEMENT: Pt reports symptoms began a couple days after time spent working on a car. Pt notes that symptoms tend to do okay as long as he is moving, but tend to worsen as day goes on and bothers him most at night. Pt states symptoms have improved since initial onset but remain irritable. Pt  states pain often wakes him when he lies L side, but also has difficulty finding comfort with repositioning. Pt denies any headaches, visual changes, rashes/skin changes, weight changes (weighs himself daily), night sweats, or overt changes in appetite. Pt denies any overt limitations in ability to perform daily activities but does have increased pain. States he was initially worked up for cardiac source which was reportedly negative, has not had any [facility] imaging of shoulder.   PERTINENT HISTORY: HFmEF LVEF 45-50%, CAD s/p PCI/stent, HTN, DM2, CKD3, hx of R ankle fractures  PAIN:  Are you having pain: yes, 5-6/10 Location: L scapular into mid ribs (C shaped pattern) How would you describe your pain? pain Best in past week: 5/10 Worst in past week: 10/10 Aggravating factors: worse at night, bending, prolonged sitting, picking up items off the ground, sleeping Easing factors: rest, positioning, self massage, being active  PRECAUTIONS: Fall, cardiac hx  WEIGHT BEARING RESTRICTIONS No  FALLS:  Has patient fallen in last 6 months? 1 fall while stepping off curb, denies any other falls  LIVING ENVIRONMENT: Lives with: lives with their spouse Lives in: House/apartment 1 level Stairs: 3 STE Has following equipment at home: None  OCCUPATION: Retired - enjoys fishing, walking, and exercising (states he stopped  working out around [employer] but wants to start back)  PLOF: [place] wants less pain and be able to move shoulder more  OBJECTIVE:   DIAGNOSTIC FINDINGS:  CTA chest/abd/pelvis 01/22/22: IMPRESSION: 1. No evidence of acute aortic injury in the chest, abdomen or pelvis. 2. No suspicious CT etiology for abdominal pain is identified.  CXR 01/22/22: FINDINGS: Cardiac silhouette is normal in size and configuration. Normal mediastinal and hilar contours.   Mild linear scarring or atelectasis in the right middle lobe and lateral left upper lobe. Lungs otherwise clear.   No pleural effusion or pneumothorax.   Skeletal structures are intact.  PATIENT SURVEYS:  FOTO deferred on this date due to pt literacy and time constraints, plan to assess at next session  COGNITION:  Overall cognitive status: Within functional limits for tasks assessed     SENSATION/NEURO: Light touch intact all extremities, questionable reduction in light touch along L UT compared to R Unremarkable dysdiadochokinesia  Reduced accuracy LUE finger<>chin compared to R, no overt ataxia or dysmetria so suspect related more so to weakness/mobility, will monitor as appropriate  POSTURE: Forward head, rounded shoulders, increased kyphosis  CERVICAL ROM:   Active  A/PROM  eval  Flexion 100%  Extension 75% painless   Right lateral flexion   Left lateral flexion   Right rotation WNL painless  Left rotation WNL painless   (Blank rows = not tested)  Comments: no issues  UPPER EXTREMITY ROM:  Active ROM Right eval Left eval  Shoulder flexion 161 142 p! Concordant   Shoulder abduction 156 105p! concordant  Shoulder internal rotation    Shoulder external rotation    Elbow flexion WNL WNL  Elbow extension WNL WNL  Wrist flexion    Wrist extension     (Blank rows = not tested) Comments:mild stiffness reported with R abduction, nonpainful  UPPER EXTREMITY MMT:  MMT Right eval Left eval   Shoulder flexion 4+ 4 painless  Shoulder extension    Shoulder abduction 4+ 4+  Shoulder extension    Shoulder internal rotation 5 5  Shoulder external rotation 5 4 mild pain  Elbow flexion    Elbow extension    Grip strength     (Blank rows =  not tested)    PALPATION:  TTP L US/LS/rhomboid, deltoid, and RC musculature. Most tender at L infraspinatus/teres, supraspinatus, LS, and lat. Improvement with brief STM although pt reports subsequent soreness   TODAY'S TREATMENT:  [facility] Adult PT Treatment:                               DATE: 02/09/22 Therapeutic Exercise: Scap retraction x10, heavy cues for reduced compensations, cues for HEP performance   PATIENT EDUCATION: Education details: Pt education on PT impairments, prognosis, and POC. Informed consent. Rationale for interventions, safe/appropriate HEP performance.  Person educated: Patient Education method: Explanation, Demonstration, Tactile cues, Verbal cues, and Handouts Education comprehension: verbalized understanding, returned demonstration, verbal cues required, tactile cues required, and needs further education    HOME EXERCISE PROGRAM: Access Code: [email or web address] URL: [email or web address] Date: 02/09/2022 Prepared by: Enis Slipper  Exercises - Seated Scapular Retraction  - 1 x daily - 7 x weekly - 3 sets - 10 reps  ASSESSMENT:  CLINICAL IMPRESSION: Pt is a 71 year old gentleman who arrives to PT evaluation on this date for L scapular pain with UE referral to elbow and L axillary region. Pt denies any overt inability to perform daily activities but states he does have increased pain and has had to modify activities. Pt states he was initially worked up for potential cardiac source of pain which was reportedly unremarkable. Today concordant symptoms are able to be provoked by [facility] mobility/strength testing, and pt is quite TTP throughout L periscapular region, most notably in L infraspinatus/teres, latissimus  dorsi, and LS/rhomboid. Symptoms initially improve with brief STM but reports mild irritability after. Pt able to perform HEP with significant cues to reduce compensations - pt reports initial discomfort with movement but reports relief afterwards. Recommend skilled PT to address aforementioned deficits to improve functional independence/tolerance as symptoms appear to be musculoskeletal in nature and pt does not endorse any overt red flags upon examination/questioning; however, given pt emphasis on nocturnal irritability, would have low threshold for communication with referring provider if pt does not respond appropriately to PT. Pt states he follows up with MD tomorrow - encouraged to discuss symptoms further with provider, pt verbalizes understanding and is agreeable.  Pt departs today's session in no acute distress, all voiced questions/concerns addressed appropriately from PT perspective.     OBJECTIVE IMPAIRMENTS decreased activity tolerance, decreased coordination, decreased endurance, decreased mobility, decreased ROM, decreased strength, impaired UE functional use, postural dysfunction, and pain.   ACTIVITY LIMITATIONS carrying, lifting, bending, sitting, sleeping, and reach over head  PARTICIPATION LIMITATIONS: community activity and yard work  PERSONAL FACTORS Age and 1-2 comorbidities: HTN, cardiac history  are also affecting patient's functional outcome.   REHAB POTENTIAL: Fair given comorbidities and symptom irritability  CLINICAL DECISION MAKING: Evolving/moderate complexity  EVALUATION COMPLEXITY: Moderate   GOALS: Goals reviewed with patient? No  SHORT TERM GOALS: Target date: 03/02/2022   Pt will demonstrate appropriate understanding and performance of initially prescribed HEP in order to facilitate improved independence with management of symptoms.  Baseline: HEP provided on eval Goal status: INITIAL   2. Pt will improve greater than MCID on FOTO in order to demonstrate  improved perception of function due to symptoms.  Baseline: deferred on eval due to time constraints/pt literacy  Goal status: INITIAL   LONG TERM GOALS: Target date: 03/23/2022    Pt will improve to predicted score on FOTO in order to demonstrate improved perception  of function due to symptoms. Baseline: deferre don eval due to time constraints, pt literacy Goal status: INITIAL  2.  Pt will demonstrate at least 140 degrees of active shoulder elevation in order to demonstrate improved tolerance daily activities such as upper body dressing and reaching into cabinets.  Baseline: see ROM chart above Goal status: INITIAL  3.  Pt will demonstrate at least 4+/5 shoulder flexion MMT for improved symmetry of UE strength and improved tolerance to functional movements.  Baseline: see MMT chart above Goal status: INITIAL  4. Pt will report/demonstrate ability to lift up to 10# from floor with less than 2 point increase in pain on NPS in order to indicative improved tolerance/independence with daily tasks.   Baseline: NT due to symptom irritability, pt reports up to 10/10 pain on NPS with bending/lifting  Goal status: INITIAL    PLAN: PT FREQUENCY: 2x/week  PT DURATION: 6 weeks  PLANNED INTERVENTIONS: Therapeutic exercises, Therapeutic activity, Neuromuscular re-education, Balance training, Gait training, Patient/Family education, Self Care, Joint mobilization, Dry Needling, Spinal mobilization, Manual therapy, and Re-evaluation  PLAN FOR NEXT SESSION: Progress ROM/strengthening exercises as able/appropriate, review HEP.    Leeroy Cha PT, DPT 02/09/2022 5:49 PM    Referring diagnosis? M89.8X1 (ICD-10-CM) - Pain of left scapula Treatment diagnosis? (if different than referring diagnosis) Left shoulder pain, unspecified chronicity  Stiffness of left shoulder, not elsewhere classified  Muscle weakness (generalized)  Abnormal posture What was this (referring dx) caused by? '[]'$   Surgery '[]'$  Fall '[]'$  Ongoing issue '[]'$  Arthritis '[x]'$  Other: preceded by increased work on car  Laterality: '[]'$  Rt '[x]'$  Lt '[]'$  Both  Check all possible CPT codes:  *CHOOSE 10 OR LESS*    '[x]'$  97110 (Therapeutic Exercise)  '[]'$  92507 (SLP Treatment)  '[x]'$  97112 (Neuro Re-ed)   '[]'$  92526 (Swallowing Treatment)   '[x]'$  73710 (Gait Training)   '[]'$  [id number] (Cognitive Training, 1st 15 minutes) '[x]'$  97140 (Manual Therapy)   '[]'$  97130 (Cognitive Training, each add'l 15 minutes)  '[x]'$  62694 (Re-evaluation)                              '[]'$  Other, List CPT Code ____________  '[x]'$  85462 (Therapeutic Activities)     '[x]'$  97535 (Self Care)   '[x]'$  All codes above (97110 - 97535)  '[]'$  97012 (Mechanical Traction)  '[]'$  97014 (E-stim Unattended)  '[]'$  97032 (E-stim manual)  '[]'$  97033 (Ionto)  '[]'$  97035 (Ultrasound) '[]'$  97750 (Physical Performance Training) '[]'$  [id number] (Aquatic Therapy) '[]'$  97016 (Vasopneumatic Device) '[]'$  [id number] (Paraffin) '[]'$  97034 (Contrast Bath) '[]'$  97597 (Wound Care 1st 20 sq cm) '[]'$  97598 (Wound Care each add'l 20 sq cm) '[]'$  70350 (Orthotic Fabrication, Fitting, Training Initial) '[]'$  [id number] (Prosthetic Management and Training Initial) '[]'$  [id number] (Orthotic or Prosthetic Training/ Modification Subsequent)         Pt discharged from physical therapy due to not returning following his evaluation.  Kristoffer Leamon PT, DPT, LAT, ATC  02/28/22  2:56 PM

## 2022-02-09 ENCOUNTER — Ambulatory Visit: Payer: Medicare HMO | Attending: General Practice | Admitting: Physical Therapy

## 2022-02-09 ENCOUNTER — Encounter: Payer: Self-pay | Admitting: Physical Therapy

## 2022-02-09 ENCOUNTER — Other Ambulatory Visit: Payer: Self-pay

## 2022-02-09 DIAGNOSIS — R293 Abnormal posture: Secondary | ICD-10-CM | POA: Insufficient documentation

## 2022-02-09 DIAGNOSIS — M898X1 Other specified disorders of bone, shoulder: Secondary | ICD-10-CM | POA: Insufficient documentation

## 2022-02-09 DIAGNOSIS — M6281 Muscle weakness (generalized): Secondary | ICD-10-CM | POA: Diagnosis not present

## 2022-02-09 DIAGNOSIS — M25612 Stiffness of left shoulder, not elsewhere classified: Secondary | ICD-10-CM | POA: Insufficient documentation

## 2022-02-09 DIAGNOSIS — M25512 Pain in left shoulder: Secondary | ICD-10-CM | POA: Diagnosis not present

## 2022-02-17 ENCOUNTER — Encounter: Payer: Self-pay | Admitting: Family Medicine

## 2022-02-17 ENCOUNTER — Ambulatory Visit (INDEPENDENT_AMBULATORY_CARE_PROVIDER_SITE_OTHER): Payer: Medicare HMO | Admitting: Family Medicine

## 2022-02-17 ENCOUNTER — Ambulatory Visit: Payer: Self-pay

## 2022-02-17 VITALS — BP 136/80 | HR 105 | Ht 65.0 in | Wt 198.0 lb

## 2022-02-17 DIAGNOSIS — E119 Type 2 diabetes mellitus without complications: Secondary | ICD-10-CM | POA: Diagnosis not present

## 2022-02-17 DIAGNOSIS — I10 Essential (primary) hypertension: Secondary | ICD-10-CM

## 2022-02-17 NOTE — Progress Notes (Signed)
    SUBJECTIVE:   CHIEF COMPLAINT / HPI:   HTN - Meds include Coreg '25mg'$  BID, amlodipine '10mg'$  daily - doesn't check BP at home often  T2DM - Meds include metformin '1000mg'$  BID, Rosuvastatin '40mg'$  daily, Insulin 10 Units  - Glargine 30 Units, Fiasp 10 Units with supper.  - Sugar elevated this morning since  - Recently started back Jardiance '10mg'$  daily - Needs eye exam  Shoulder  - Starting PT next week - Not having pain today - Pain seems to be more consistently at night or when sitting for long periods  PERTINENT  PMH / PSH: Reviewed  OBJECTIVE:   BP 136/80   Pulse (!) 105   Ht '5\' 5"'$  (1.651 m)   Wt 198 lb (89.8 kg)   SpO2 95%   BMI 32.95 kg/m   General: NAD, well-appearing, well-nourished Respiratory: No respiratory distress, breathing comfortably, able to speak in full sentences Skin: warm and dry, no rashes noted on exposed skin Psych: Appropriate affect and mood Left shoulder: non-TTP, full active ROM  ASSESSMENT/PLAN:   Essential hypertension, benign BP 136/80, slightly above goal. Is working with cardiology.  - Continue coreg, amlodipine, and jardiance.  - BMP collected is reassuringly stable  Type 2 diabetes mellitus without complication, without long-term current use of insulin (HCC) CBG this morning was elevated. Patient still taking 30 Units of Glargine, doesn't seem to be uptitrating so may need to do another route of treatment - Continue Jardiance, metformin, rosuvastatin - Continue CGM discussion - Patient interested in possibly restarting Trulicity or an equivalent to decrease the amount of other medications he requires, will need to discuss with pharmacy and will touch base with patient on Monday given history of possibly medication induced pancreatitis.    Shoulder Pain Improved today on exam with full ROM. Problem occurs at night and when sitting for long periods. May need to consider a new mattress if continued problems. Is starting with PT on  Monday.   [name], [place]

## 2022-02-17 NOTE — Patient Outreach (Signed)
  Care Coordination   Follow Up Visit Note   02/17/2022 Name: [name] MRN: [id number] DOB: November 02, 1950  [name] is a 71 y.o. year old male who sees Lilland, Alana, DO for primary care. I spoke with  [name] by phone today.  What matters to the patients health and wellness today?  I feel good today.    Goals Addressed             This Visit's Progress    I want to manage my chronic conditions       Care Coordination Interventions: Provided education to patient about basic DM disease process Reviewed medications with patient and discussed importance of medication adherence Counseled on importance of regular laboratory monitoring as prescribed Discussed plans with patient for ongoing care management follow up and provided patient with direct contact information for care management team Provided patient with written educational materials related to hypo and hyperglycemia and importance of correct treatment Advised patient, providing education and rationale, to check cbg and record, calling the office for findings outside established parameters Review of patient status, including review of consultants reports, relevant laboratory and other test results, and medications completed Active listening / Reflection utilized  Emotional Support Provided Problem [place] strategies reviewed [name] is doing well, except for the shoulder pain that bothers him at night. He has an appointment with Dr. Oleh Genin today at 2:50 PM to discuss this issue. He has been monitoring his weight daily, and there are no signs of swelling, chest pain, or shortness of breath. I gave him one of my CHF booklets to read during his visit with the doctor, and he also received the information I sent him about diabetes. His blood sugar level was elevated this morning (190) as he had eaten some cookies the night before. He is monitoring his blood pressure, and he remembers the systolic readings, which were 138  and 140. I reminded him to write them down, but also praised him for the positive changes he has made.        SDOH assessments and interventions completed:  No     Care Coordination Interventions Activated:  Yes  Care Coordination Interventions:  Yes, provided   Follow up plan: Follow up call scheduled for 11/29 1 pm    Encounter Outcome:  Pt. Visit Completed   Lazaro Arms RN, BSN, [facility]   Phone: [phone]

## 2022-02-17 NOTE — Patient Instructions (Addendum)
Your blood pressure looks better today. We are going to check your kidney function today. I am going to check with the pharmacist about the Trulicity medication and get back with you.    If your blood sugars are still >150 consistently, let me know and we may need to adjust your medications.

## 2022-02-17 NOTE — Patient Instructions (Signed)
Visit Information  Thank you for taking time to visit with me today. Please don't hesitate to contact me if I can be of assistance to you.   Following are the goals we discussed today:   Goals Addressed             This Visit's Progress    I want to manage my chronic conditions       Care Coordination Interventions: Provided education to patient about basic DM disease process Reviewed medications with patient and discussed importance of medication adherence Counseled on importance of regular laboratory monitoring as prescribed Discussed plans with patient for ongoing care management follow up and provided patient with direct contact information for care management team Provided patient with written educational materials related to hypo and hyperglycemia and importance of correct treatment Advised patient, providing education and rationale, to check cbg and record, calling the office for findings outside established parameters Review of patient status, including review of consultants reports, relevant laboratory and other test results, and medications completed Active listening / Reflection utilized  Emotional Support Provided Problem [place] strategies reviewed Mr. Jumper is doing well, except for the shoulder pain that bothers him at night. He has an appointment with Dr. Oleh Genin today at 2:50 PM to discuss this issue. He has been monitoring his weight daily, and there are no signs of swelling, chest pain, or shortness of breath. I gave him one of my CHF booklets to read during his visit with the doctor, and he also received the information I sent him about diabetes. His blood sugar level was elevated this morning (190) as he had eaten some cookies the night before. He is monitoring his blood pressure, and he remembers the systolic readings, which were 138 and 140. I reminded him to write them down, but also praised him for the positive changes he has made.        Our next  appointment is by telephone on 11/29 at 1 pm  Please call the care guide team at [phone] if you need to cancel or reschedule your appointment.   If you are experiencing a Mental Health or [place] or need someone to talk to, please call 1-800-273-TALK (toll free, 24 hour hotline)  Patient verbalizes understanding of instructions and care plan provided today and agrees to view in [place]. Active MyChart status and patient understanding of how to access instructions and care plan via MyChart confirmed with patient.     Lazaro Arms RN, BSN, [facility]   Phone: [phone]

## 2022-02-18 LAB — BASIC METABOLIC PANEL
BUN/Creatinine Ratio: 19 (ref 10–24)
BUN: 29 mg/dL — ABNORMAL HIGH (ref 8–27)
CO2: 22 mmol/L (ref 20–29)
Calcium: 9.8 mg/dL (ref 8.6–10.2)
Chloride: 104 mmol/L (ref 96–106)
Creatinine, Ser: 1.54 mg/dL — ABNORMAL HIGH (ref 0.76–1.27)
Glucose: 231 mg/dL — ABNORMAL HIGH (ref 70–99)
Potassium: 4.5 mmol/L (ref 3.5–5.2)
Sodium: 143 mmol/L (ref 134–144)
eGFR: 48 mL/min/{1.73_m2} — ABNORMAL LOW (ref >59)

## 2022-02-18 NOTE — Assessment & Plan Note (Signed)
CBG this morning was elevated. Patient still taking 30 Units of Glargine, doesn't seem to be uptitrating so may need to do another route of treatment - Continue Jardiance, metformin, rosuvastatin - Continue CGM discussion - Patient interested in possibly restarting Trulicity or an equivalent to decrease the amount of other medications he requires, will need to discuss with pharmacy and will touch base with patient on Monday given history of possibly medication induced pancreatitis.

## 2022-02-18 NOTE — Assessment & Plan Note (Signed)
BP 136/80, slightly above goal. Is working with cardiology.  - Continue coreg, amlodipine, and jardiance.  - BMP collected is reassuringly stable

## 2022-02-21 ENCOUNTER — Telehealth: Payer: Self-pay | Admitting: Physical Therapy

## 2022-02-21 ENCOUNTER — Ambulatory Visit: Payer: Medicare HMO | Admitting: Physical Therapy

## 2022-02-21 NOTE — Telephone Encounter (Signed)
LVM regarding missed appointment today, hope he is doing okay. I Noted when his next scheduled appointment is and if he cannot make it to please call and we can work to cancel/ reschedule that appt for him.    Rudolph Dobler PT, DPT, LAT, ATC  02/21/22  2:40 PM

## 2022-02-21 NOTE — Therapy (Incomplete)
OUTPATIENT PHYSICAL THERAPY TREATMENT NOTE   Patient Name: [name] MRN: [id number] DOB:01/07/1951, 71 y.o., male Today's Date: 02/21/2022  PCP: [name], DO REFERRING PROVIDER: Deberah Pelton, NP  END OF SESSION:    Past Medical History:  Diagnosis Date   Diabetes mellitus without complication ([place])    Hypercholesterolemia    Hypertension    MI (myocardial infarction) [facility])    Past Surgical History:  Procedure Laterality Date   CORONARY ANGIOPLASTY WITH STENT PLACEMENT  08/01/2003   Proximal LAD   Patient Active Problem List   Diagnosis Date Noted   Heart failure with mildly reduced ejection fraction ([place]) 01/24/2022   Hypokalemia 01/24/2022   Non-cardiac chest pain    Coronary artery disease involving native coronary artery of native heart without angina pectoris    Hyperlipidemia    Atypical angina 01/22/2022   Benign paroxysmal positional vertigo 09/16/2020   Low-level of literacy 05/21/2018   Globus sensation 03/12/2018   Type 2 diabetes mellitus without complication, without long-term current use of insulin ([place]) 06/09/2017   Pure hypercholesterolemia 06/09/2017   History of coronary artery stent placement 06/09/2017   Essential hypertension, benign 06/09/2017    REFERRING DIAG: M89.8X1 (ICD-10-CM) - Pain of left scapula  THERAPY DIAG:  No diagnosis found.  Rationale for Evaluation and Treatment Rehabilitation  PERTINENT HISTORY: HFmEF LVEF 45-50%, CAD s/p PCI/stent, HTN, DM2, CKD3, hx of R ankle fractures  PRECAUTIONS: Fall, cardiac hx  SUBJECTIVE:                                                                                                                                                                                      SUBJECTIVE STATEMENT:  ***   PAIN:  Are you having pain? Yes: NPRS scale: ***/10 Pain location: *** Pain description: *** Aggravating factors: *** Relieving factors: ***   OBJECTIVE: (objective measures completed  at initial evaluation unless otherwise dated)   DIAGNOSTIC FINDINGS:  CTA chest/abd/pelvis 01/22/22: IMPRESSION: 1. No evidence of acute aortic injury in the chest, abdomen or pelvis. 2. No suspicious CT etiology for abdominal pain is identified.   CXR 01/22/22: FINDINGS: Cardiac silhouette is normal in size and configuration. Normal mediastinal and hilar contours.   Mild linear scarring or atelectasis in the right middle lobe and lateral left upper lobe. Lungs otherwise clear.   No pleural effusion or pneumothorax.   Skeletal structures are intact.   PATIENT SURVEYS:  FOTO deferred on this date due to pt literacy and time constraints, plan to assess at next session   COGNITION:           Overall cognitive status: Within functional limits for tasks assessed  SENSATION/NEURO: Light touch intact all extremities, questionable reduction in light touch along L UT compared to R Unremarkable dysdiadochokinesia  Reduced accuracy LUE finger<>chin compared to R, no overt ataxia or dysmetria so suspect related more so to weakness/mobility, will monitor as appropriate   POSTURE: Forward head, rounded shoulders, increased kyphosis   CERVICAL ROM:    Active  A/PROM  eval  Flexion 100%  Extension 75% painless   Right lateral flexion    Left lateral flexion    Right rotation WNL painless  Left rotation WNL painless   (Blank rows = not tested)   Comments: no issues   UPPER EXTREMITY ROM:   Active ROM Right eval Left eval  Shoulder flexion 161 142 p! Concordant   Shoulder abduction 156 105p! concordant  Shoulder internal rotation      Shoulder external rotation      Elbow flexion WNL WNL  Elbow extension WNL WNL  Wrist flexion      Wrist extension       (Blank rows = not tested) Comments:mild stiffness reported with R abduction, nonpainful   UPPER EXTREMITY MMT:   MMT Right eval Left eval  Shoulder flexion 4+ 4 painless  Shoulder  extension      Shoulder abduction 4+ 4+  Shoulder extension      Shoulder internal rotation 5 5  Shoulder external rotation 5 4 mild pain  Elbow flexion      Elbow extension      Grip strength       (Blank rows = not tested)      PALPATION:  TTP L US/LS/rhomboid, deltoid, and RC musculature. Most tender at L infraspinatus/teres, supraspinatus, LS, and lat. Improvement with brief STM although pt reports subsequent soreness             TODAY'S TREATMENT:  [facility] Adult PT Treatment:                                                DATE: 02/21/2022 Therapeutic Exercise: *** Manual Therapy: *** Neuromuscular re-ed: *** Therapeutic Activity: *** Modalities: *** Self Care: ***   [facility] Adult PT Treatment:                               DATE: 02/09/22 Therapeutic Exercise: Scap retraction x10, heavy cues for reduced compensations, cues for HEP performance     PATIENT EDUCATION: Education details: Pt education on PT impairments, prognosis, and POC. Informed consent. Rationale for interventions, safe/appropriate HEP performance.  Person educated: Patient Education method: Explanation, Demonstration, Tactile cues, Verbal cues, and Handouts Education comprehension: verbalized understanding, returned demonstration, verbal cues required, tactile cues required, and needs further education      HOME EXERCISE PROGRAM: Access Code: [email or web address] URL: [email or web address] Date: 02/09/2022 Prepared by: Enis Slipper   Exercises - Seated Scapular Retraction  - 1 x daily - 7 x weekly - 3 sets - 10 reps   ASSESSMENT:   CLINICAL IMPRESSION: ***     OBJECTIVE IMPAIRMENTS decreased activity tolerance, decreased coordination, decreased endurance, decreased mobility, decreased ROM, decreased strength, impaired UE functional use, postural dysfunction, and pain.    ACTIVITY LIMITATIONS carrying, lifting, bending, sitting, sleeping, and reach over head   PARTICIPATION LIMITATIONS:  community activity and yard work   PERSONAL FACTORS Age and 1-2 comorbidities: HTN, cardiac  history  are also affecting patient's functional outcome.    REHAB POTENTIAL: Fair given comorbidities and symptom irritability   CLINICAL DECISION MAKING: Evolving/moderate complexity   EVALUATION COMPLEXITY: Moderate     GOALS: Goals reviewed with patient? No   SHORT TERM GOALS: Target date: 03/02/2022    Pt will demonstrate appropriate understanding and performance of initially prescribed HEP in order to facilitate improved independence with management of symptoms.  Baseline: HEP provided on eval Goal status: INITIAL    2. Pt will improve greater than MCID on FOTO in order to demonstrate improved perception of function due to symptoms.            Baseline: deferred on eval due to time constraints/pt literacy            Goal status: INITIAL    LONG TERM GOALS: Target date: 03/23/2022     Pt will improve to predicted score on FOTO in order to demonstrate improved perception of function due to symptoms. Baseline: deferre don eval due to time constraints, pt literacy Goal status: INITIAL   2.  Pt will demonstrate at least 140 degrees of active shoulder elevation in order to demonstrate improved tolerance daily activities such as upper body dressing and reaching into cabinets.  Baseline: see ROM chart above Goal status: INITIAL   3.  Pt will demonstrate at least 4+/5 shoulder flexion MMT for improved symmetry of UE strength and improved tolerance to functional movements.  Baseline: see MMT chart above Goal status: INITIAL   4. Pt will report/demonstrate ability to lift up to 10# from floor with less than 2 point increase in pain on NPS in order to indicative improved tolerance/independence with daily tasks.             Baseline: NT due to symptom irritability, pt reports up to 10/10 pain on NPS with bending/lifting            Goal status: INITIAL      PLAN: PT FREQUENCY: 2x/week   PT  DURATION: 6 weeks   PLANNED INTERVENTIONS: Therapeutic exercises, Therapeutic activity, Neuromuscular re-education, Balance training, Gait training, Patient/Family education, Self Care, Joint mobilization, Dry Needling, Spinal mobilization, Manual therapy, and Re-evaluation   PLAN FOR NEXT SESSION: Progress ROM/strengthening exercises as able/appropriate, review HEP    Greg Cratty PT, DPT, LAT, ATC  02/21/22  9:06 AM

## 2022-02-23 ENCOUNTER — Ambulatory Visit: Payer: Medicare HMO | Attending: General Practice | Admitting: Physical Therapy

## 2022-02-23 ENCOUNTER — Telehealth: Payer: Self-pay | Admitting: Physical Therapy

## 2022-02-23 NOTE — Telephone Encounter (Signed)
Called pt re: today's appointment - did not answer, left voicemail with call back number, confirmed next appointment, advised on attendance policy

## 2022-02-23 NOTE — Therapy (Incomplete)
OUTPATIENT PHYSICAL THERAPY TREATMENT NOTE   Patient Name: [name] MRN: [id number] DOB:1950/12/25, 71 y.o., male Today's Date: 02/23/2022  PCP: [name], DO   REFERRING PROVIDER: Deberah Pelton, NP  END OF SESSION:    Past Medical History:  Diagnosis Date   Diabetes mellitus without complication ([place])    Hypercholesterolemia    Hypertension    MI (myocardial infarction) [facility])    Past Surgical History:  Procedure Laterality Date   CORONARY ANGIOPLASTY WITH STENT PLACEMENT  08/01/2003   Proximal LAD   Patient Active Problem List   Diagnosis Date Noted   Heart failure with mildly reduced ejection fraction ([place]) 01/24/2022   Hypokalemia 01/24/2022   Non-cardiac chest pain    Coronary artery disease involving native coronary artery of native heart without angina pectoris    Hyperlipidemia    Atypical angina 01/22/2022   Benign paroxysmal positional vertigo 09/16/2020   Low-level of literacy 05/21/2018   Globus sensation 03/12/2018   Type 2 diabetes mellitus without complication, without long-term current use of insulin ([place]) 06/09/2017   Pure hypercholesterolemia 06/09/2017   History of coronary artery stent placement 06/09/2017   Essential hypertension, benign 06/09/2017    REFERRING DIAG: M89.8X1 (ICD-10-CM) - Pain of left scapula  THERAPY DIAG:  No diagnosis found.  Rationale for Evaluation and Treatment Rehabilitation  PERTINENT HISTORY: HFmEF LVEF 45-50%, CAD s/p PCI/stent, HTN, DM2, CKD3, hx of R ankle fractures  PRECAUTIONS: Fall, cardiac hx  SUBJECTIVE:                                                                                                                                                                                      SUBJECTIVE STATEMENT:  ***   PAIN:  Are you having pain: yes, 5-6/10 Location: L scapular into mid ribs (C shaped pattern) How would you describe your pain? pain Best in past week: 5/10 Worst in past week:  10/10 Aggravating factors: worse at night, bending, prolonged sitting, picking up items off the ground, sleeping Easing factors: rest, positioning, self massage, being active   OBJECTIVE: (objective measures completed at initial evaluation unless otherwise dated)   DIAGNOSTIC FINDINGS:  CTA chest/abd/pelvis 01/22/22: IMPRESSION: 1. No evidence of acute aortic injury in the chest, abdomen or pelvis. 2. No suspicious CT etiology for abdominal pain is identified.   CXR 01/22/22: FINDINGS: Cardiac silhouette is normal in size and configuration. Normal mediastinal and hilar contours.   Mild linear scarring or atelectasis in the right middle lobe and lateral left upper lobe. Lungs otherwise clear.   No pleural effusion or pneumothorax.   Skeletal structures are intact.   PATIENT SURVEYS:  FOTO deferred on  this date due to pt literacy and time constraints, plan to assess at next session   COGNITION:           Overall cognitive status: Within functional limits for tasks assessed                                  SENSATION/NEURO: Light touch intact all extremities, questionable reduction in light touch along L UT compared to R Unremarkable dysdiadochokinesia  Reduced accuracy LUE finger<>chin compared to R, no overt ataxia or dysmetria so suspect related more so to weakness/mobility, will monitor as appropriate   POSTURE: Forward head, rounded shoulders, increased kyphosis   CERVICAL ROM:    Active  A/PROM  eval  Flexion 100%  Extension 75% painless   Right lateral flexion    Left lateral flexion    Right rotation WNL painless  Left rotation WNL painless   (Blank rows = not tested)   Comments: no issues   UPPER EXTREMITY ROM:   Active ROM Right eval Left eval  Shoulder flexion 161 142 p! Concordant   Shoulder abduction 156 105p! concordant  Shoulder internal rotation      Shoulder external rotation      Elbow flexion WNL WNL  Elbow extension WNL WNL  Wrist  flexion      Wrist extension       (Blank rows = not tested) Comments:mild stiffness reported with R abduction, nonpainful   UPPER EXTREMITY MMT:   MMT Right eval Left eval  Shoulder flexion 4+ 4 painless  Shoulder extension      Shoulder abduction 4+ 4+  Shoulder extension      Shoulder internal rotation 5 5  Shoulder external rotation 5 4 mild pain  Elbow flexion      Elbow extension      Grip strength       (Blank rows = not tested)      PALPATION:  TTP L US/LS/rhomboid, deltoid, and RC musculature. Most tender at L infraspinatus/teres, supraspinatus, LS, and lat. Improvement with brief STM although pt reports subsequent soreness             TODAY'S TREATMENT:  [place] Adult PT Treatment:                                                DATE: 02/23/22 Therapeutic Exercise: *** Manual Therapy: *** Neuromuscular re-ed: *** Therapeutic Activity: *** Modalities: *** Self Care: ***   [facility] Adult PT Treatment:                               DATE: 02/09/22 Therapeutic Exercise: Scap retraction x10, heavy cues for reduced compensations, cues for HEP performance     PATIENT EDUCATION: Education details: Pt education on PT impairments, prognosis, and POC. Informed consent. Rationale for interventions, safe/appropriate HEP performance.  Person educated: Patient Education method: Explanation, Demonstration, Tactile cues, Verbal cues, and Handouts Education comprehension: verbalized understanding, returned demonstration, verbal cues required, tactile cues required, and needs further education      HOME EXERCISE PROGRAM: Access Code: [email or web address] URL: [email or web address] Date: 02/09/2022 Prepared by: Enis Slipper   Exercises - Seated Scapular Retraction  - 1 x daily - 7 x weekly - 3 sets - 10  reps   ASSESSMENT:   CLINICAL IMPRESSION: ***  *** Pt is a 71 year old gentleman who arrives to PT evaluation on this date for L scapular pain with UE referral to  elbow and L axillary region. Pt denies any overt inability to perform daily activities but states he does have increased pain and has had to modify activities. Pt states he was initially worked up for potential cardiac source of pain which was reportedly unremarkable. Today concordant symptoms are able to be provoked by [facility] mobility/strength testing, and pt is quite TTP throughout L periscapular region, most notably in L infraspinatus/teres, latissimus dorsi, and LS/rhomboid. Symptoms initially improve with brief STM but reports mild irritability after. Pt able to perform HEP with significant cues to reduce compensations - pt reports initial discomfort with movement but reports relief afterwards. Recommend skilled PT to address aforementioned deficits to improve functional independence/tolerance as symptoms appear to be musculoskeletal in nature and pt does not endorse any overt red flags upon examination/questioning; however, given pt emphasis on nocturnal irritability, would have low threshold for communication with referring provider if pt does not respond appropriately to PT. Pt states he follows up with MD tomorrow - encouraged to discuss symptoms further with provider, pt verbalizes understanding and is agreeable.  Pt departs today's session in no acute distress, all voiced questions/concerns addressed appropriately from PT perspective.       OBJECTIVE IMPAIRMENTS decreased activity tolerance, decreased coordination, decreased endurance, decreased mobility, decreased ROM, decreased strength, impaired UE functional use, postural dysfunction, and pain.    ACTIVITY LIMITATIONS carrying, lifting, bending, sitting, sleeping, and reach over head   PARTICIPATION LIMITATIONS: community activity and yard work   PERSONAL FACTORS Age and 1-2 comorbidities: HTN, cardiac history  are also affecting patient's functional outcome.    REHAB POTENTIAL: Fair given comorbidities and symptom irritability   CLINICAL  DECISION MAKING: Evolving/moderate complexity   EVALUATION COMPLEXITY: Moderate     GOALS: Goals reviewed with patient? No   SHORT TERM GOALS: Target date: 03/02/2022    Pt will demonstrate appropriate understanding and performance of initially prescribed HEP in order to facilitate improved independence with management of symptoms.  Baseline: HEP provided on eval Goal status: INITIAL    2. Pt will improve greater than MCID on FOTO in order to demonstrate improved perception of function due to symptoms.            Baseline: deferred on eval due to time constraints/pt literacy            Goal status: INITIAL    LONG TERM GOALS: Target date: 03/23/2022     Pt will improve to predicted score on FOTO in order to demonstrate improved perception of function due to symptoms. Baseline: deferre don eval due to time constraints, pt literacy Goal status: INITIAL   2.  Pt will demonstrate at least 140 degrees of active shoulder elevation in order to demonstrate improved tolerance daily activities such as upper body dressing and reaching into cabinets.  Baseline: see ROM chart above Goal status: INITIAL   3.  Pt will demonstrate at least 4+/5 shoulder flexion MMT for improved symmetry of UE strength and improved tolerance to functional movements.  Baseline: see MMT chart above Goal status: INITIAL   4. Pt will report/demonstrate ability to lift up to 10# from floor with less than 2 point increase in pain on NPS in order to indicative improved tolerance/independence with daily tasks.  Baseline: NT due to symptom irritability, pt reports up to 10/10 pain on NPS with bending/lifting            Goal status: INITIAL      PLAN: PT FREQUENCY: 2x/week   PT DURATION: 6 weeks   PLANNED INTERVENTIONS: Therapeutic exercises, Therapeutic activity, Neuromuscular re-education, Balance training, Gait training, Patient/Family education, Self Care, Joint mobilization, Dry Needling, Spinal  mobilization, Manual therapy, and Re-evaluation   PLAN FOR NEXT SESSION: Progress ROM/strengthening exercises as able/appropriate, review HEP.     Leeroy Cha PT, DPT 02/23/2022 8:30 AM

## 2022-02-25 ENCOUNTER — Encounter: Payer: Self-pay | Admitting: Family Medicine

## 2022-02-26 ENCOUNTER — Other Ambulatory Visit: Payer: Self-pay | Admitting: Family Medicine

## 2022-02-26 MED ORDER — CYCLOBENZAPRINE HCL 5 MG PO TABS: 5.0000 mg | ORAL_TABLET | Freq: Every day | ORAL | 0 refills | Status: DC

## 2022-02-28 ENCOUNTER — Encounter: Payer: Self-pay | Admitting: Family Medicine

## 2022-02-28 ENCOUNTER — Telehealth: Payer: Self-pay | Admitting: Physical Therapy

## 2022-02-28 ENCOUNTER — Ambulatory Visit: Payer: Medicare HMO | Admitting: Physical Therapy

## 2022-02-28 NOTE — Therapy (Incomplete)
OUTPATIENT PHYSICAL THERAPY TREATMENT NOTE   Patient Name: [name] MRN: [id number] DOB:[birth year], 71 y.o., male Today's Date: 02/28/2022  PCP: [name], DO REFERRING PROVIDER: Deberah Pelton, NP  END OF SESSION:    Past Medical History:  Diagnosis Date   Diabetes mellitus without complication ([place])    Hypercholesterolemia    Hypertension    MI (myocardial infarction) [facility])    Past Surgical History:  Procedure Laterality Date   CORONARY ANGIOPLASTY WITH STENT PLACEMENT  08/01/2003   Proximal LAD   Patient Active Problem List   Diagnosis Date Noted   Heart failure with mildly reduced ejection fraction ([place]) 01/24/2022   Hypokalemia 01/24/2022   Non-cardiac chest pain    Coronary artery disease involving native coronary artery of native heart without angina pectoris    Hyperlipidemia    Atypical angina 01/22/2022   Benign paroxysmal positional vertigo 09/16/2020   Low-level of literacy 05/21/2018   Globus sensation 03/12/2018   Type 2 diabetes mellitus without complication, without long-term current use of insulin ([place]) 06/09/2017   Pure hypercholesterolemia 06/09/2017   History of coronary artery stent placement 06/09/2017   Essential hypertension, benign 06/09/2017    REFERRING DIAG: M89.8X1 (ICD-10-CM) - Pain of left scapula  THERAPY DIAG:  No diagnosis found.  Rationale for Evaluation and Treatment Rehabilitation  PERTINENT HISTORY: HFmEF LVEF 45-50%, CAD s/p PCI/stent, HTN, DM2, CKD3, hx of R ankle fractures  PRECAUTIONS: Fall, cardiac hx  SUBJECTIVE:                                                                                                                                                                                      SUBJECTIVE STATEMENT:  ***   PAIN:  Are you having pain? Yes: NPRS scale: ***/10 Pain location: *** Pain description: *** Aggravating factors: *** Relieving factors: ***   OBJECTIVE: (objective measures completed  at initial evaluation unless otherwise dated)   DIAGNOSTIC FINDINGS:  CTA chest/abd/pelvis 01/22/22: IMPRESSION: 1. No evidence of acute aortic injury in the chest, abdomen or pelvis. 2. No suspicious CT etiology for abdominal pain is identified.   CXR 01/22/22: FINDINGS: Cardiac silhouette is normal in size and configuration. Normal mediastinal and hilar contours.   Mild linear scarring or atelectasis in the right middle lobe and lateral left upper lobe. Lungs otherwise clear.   No pleural effusion or pneumothorax.   Skeletal structures are intact.   PATIENT SURVEYS:  FOTO deferred on this date due to pt literacy and time constraints, plan to assess at next session   COGNITION:           Overall cognitive status: Within functional limits for tasks assessed  SENSATION/NEURO: Light touch intact all extremities, questionable reduction in light touch along L UT compared to R Unremarkable dysdiadochokinesia  Reduced accuracy LUE finger<>chin compared to R, no overt ataxia or dysmetria so suspect related more so to weakness/mobility, will monitor as appropriate   POSTURE: Forward head, rounded shoulders, increased kyphosis   CERVICAL ROM:    Active  A/PROM  eval  Flexion 100%  Extension 75% painless   Right lateral flexion    Left lateral flexion    Right rotation WNL painless  Left rotation WNL painless   (Blank rows = not tested)   Comments: no issues   UPPER EXTREMITY ROM:   Active ROM Right eval Left eval  Shoulder flexion 161 142 p! Concordant   Shoulder abduction 156 105p! concordant  Shoulder internal rotation      Shoulder external rotation      Elbow flexion WNL WNL  Elbow extension WNL WNL  Wrist flexion      Wrist extension       (Blank rows = not tested) Comments:mild stiffness reported with R abduction, nonpainful   UPPER EXTREMITY MMT:   MMT Right eval Left eval  Shoulder flexion 4+ 4 painless  Shoulder  extension      Shoulder abduction 4+ 4+  Shoulder extension      Shoulder internal rotation 5 5  Shoulder external rotation 5 4 mild pain  Elbow flexion      Elbow extension      Grip strength       (Blank rows = not tested)      PALPATION:  TTP L US/LS/rhomboid, deltoid, and RC musculature. Most tender at L infraspinatus/teres, supraspinatus, LS, and lat. Improvement with brief STM although pt reports subsequent soreness             TODAY'S TREATMENT:  [facility] Adult PT Treatment:                                                DATE: 02/28/2022 Therapeutic Exercise: *** Manual Therapy: *** Neuromuscular re-ed: *** Therapeutic Activity: *** Modalities: *** Self Care: ***   [facility] Adult PT Treatment:                               DATE: 02/09/22 Therapeutic Exercise: Scap retraction x10, heavy cues for reduced compensations, cues for HEP performance     PATIENT EDUCATION: Education details: Pt education on PT impairments, prognosis, and POC. Informed consent. Rationale for interventions, safe/appropriate HEP performance.  Person educated: Patient Education method: Explanation, Demonstration, Tactile cues, Verbal cues, and Handouts Education comprehension: verbalized understanding, returned demonstration, verbal cues required, tactile cues required, and needs further education      HOME EXERCISE PROGRAM: Access Code: [email or web address] URL: [email or web address] Date: 02/09/2022 Prepared by: Enis Slipper   Exercises - Seated Scapular Retraction  - 1 x daily - 7 x weekly - 3 sets - 10 reps   ASSESSMENT:   CLINICAL IMPRESSION: ***     OBJECTIVE IMPAIRMENTS decreased activity tolerance, decreased coordination, decreased endurance, decreased mobility, decreased ROM, decreased strength, impaired UE functional use, postural dysfunction, and pain.    ACTIVITY LIMITATIONS carrying, lifting, bending, sitting, sleeping, and reach over head   PARTICIPATION LIMITATIONS:  community activity and yard work   PERSONAL FACTORS Age and 1-2 comorbidities: HTN, cardiac  history  are also affecting patient's functional outcome.    REHAB POTENTIAL: Fair given comorbidities and symptom irritability   CLINICAL DECISION MAKING: Evolving/moderate complexity   EVALUATION COMPLEXITY: Moderate     GOALS: Goals reviewed with patient? No   SHORT TERM GOALS: Target date: 03/02/2022    Pt will demonstrate appropriate understanding and performance of initially prescribed HEP in order to facilitate improved independence with management of symptoms.  Baseline: HEP provided on eval Goal status: INITIAL    2. Pt will improve greater than MCID on FOTO in order to demonstrate improved perception of function due to symptoms.            Baseline: deferred on eval due to time constraints/pt literacy            Goal status: INITIAL    LONG TERM GOALS: Target date: 03/23/2022     Pt will improve to predicted score on FOTO in order to demonstrate improved perception of function due to symptoms. Baseline: deferre don eval due to time constraints, pt literacy Goal status: INITIAL   2.  Pt will demonstrate at least 140 degrees of active shoulder elevation in order to demonstrate improved tolerance daily activities such as upper body dressing and reaching into cabinets.  Baseline: see ROM chart above Goal status: INITIAL   3.  Pt will demonstrate at least 4+/5 shoulder flexion MMT for improved symmetry of UE strength and improved tolerance to functional movements.  Baseline: see MMT chart above Goal status: INITIAL   4. Pt will report/demonstrate ability to lift up to 10# from floor with less than 2 point increase in pain on NPS in order to indicative improved tolerance/independence with daily tasks.             Baseline: NT due to symptom irritability, pt reports up to 10/10 pain on NPS with bending/lifting            Goal status: INITIAL      PLAN: PT FREQUENCY: 2x/week   PT  DURATION: 6 weeks   PLANNED INTERVENTIONS: Therapeutic exercises, Therapeutic activity, Neuromuscular re-education, Balance training, Gait training, Patient/Family education, Self Care, Joint mobilization, Dry Needling, Spinal mobilization, Manual therapy, and Re-evaluation   PLAN FOR NEXT SESSION: Progress ROM/strengthening exercises as able/appropriate, review HEP    Gery Sabedra PT, DPT, LAT, ATC  02/28/22  8:06 AM

## 2022-02-28 NOTE — Telephone Encounter (Signed)
LVM regarding 3rd missed appointment today. I referenced the attendance policy that 3 missed appointments is grounds for discharge from physical therapy. If he feels he requires physical therapy he will need a new referral. If he has any questions he is welcome to call the clinic and I left the clinic number.  Tawania Daponte PT, DPT, LAT, ATC  02/28/22  3:03 PM

## 2022-03-01 ENCOUNTER — Ambulatory Visit (HOSPITAL_COMMUNITY): Payer: Medicare HMO

## 2022-03-01 ENCOUNTER — Telehealth (HOSPITAL_COMMUNITY): Payer: Self-pay | Admitting: *Deleted

## 2022-03-01 NOTE — Telephone Encounter (Signed)
Call attempted to confirm HV TOC appt 0n 03/01/22 at 3 pm. HIPPA appropriate VM left with callback number.    Earnestine Leys, BSN, Clinical cytogeneticist Only

## 2022-03-02 ENCOUNTER — Ambulatory Visit: Payer: Medicare HMO | Admitting: Physical Therapy

## 2022-03-07 ENCOUNTER — Ambulatory Visit: Payer: Medicare HMO | Admitting: Physical Therapy

## 2022-03-09 ENCOUNTER — Encounter: Payer: Medicare HMO | Admitting: Physical Therapy

## 2022-03-14 ENCOUNTER — Encounter: Payer: Medicare HMO | Admitting: Physical Therapy

## 2022-03-15 DIAGNOSIS — N182 Chronic kidney disease, stage 2 (mild): Secondary | ICD-10-CM | POA: Diagnosis not present

## 2022-03-15 DIAGNOSIS — I129 Hypertensive chronic kidney disease with stage 1 through stage 4 chronic kidney disease, or unspecified chronic kidney disease: Secondary | ICD-10-CM | POA: Diagnosis not present

## 2022-03-15 DIAGNOSIS — N1831 Chronic kidney disease, stage 3a: Secondary | ICD-10-CM | POA: Diagnosis not present

## 2022-03-15 DIAGNOSIS — E1122 Type 2 diabetes mellitus with diabetic chronic kidney disease: Secondary | ICD-10-CM | POA: Diagnosis not present

## 2022-03-15 DIAGNOSIS — E785 Hyperlipidemia, unspecified: Secondary | ICD-10-CM | POA: Diagnosis not present

## 2022-03-16 ENCOUNTER — Ambulatory Visit: Payer: Medicare HMO | Admitting: Physical Therapy

## 2022-03-22 ENCOUNTER — Other Ambulatory Visit: Payer: Self-pay | Admitting: Nephrology

## 2022-03-22 DIAGNOSIS — N1831 Chronic kidney disease, stage 3a: Secondary | ICD-10-CM

## 2022-03-23 ENCOUNTER — Ambulatory Visit: Payer: Self-pay

## 2022-03-24 NOTE — Patient Outreach (Signed)
  Care Coordination   Follow Up Visit Note   03/23/2022 Name: [name] MRN: [id number] DOB: 03-Aug-1950  [name] is a 72 y.o. year old male who sees Lilland, Alana, DO for primary care. I spoke with  [name] by phone today.  What matters to the patients health and wellness today?  I spoke with [name], and she informed me that [name] is doing well. His blood sugar levels have been stable, but she couldn't provide me with the exact numbers as he was not at home. However, she did mention that he went to the nephrologist for a test and the results came back normal, which made him quite happy. She also stated that she will have him call me to discuss more information.     Goals Addressed             This Visit's Progress    I want to manage my chronic conditions       Care Coordination Interventions: Provided education to patient about basic DM disease process Reviewed medications with patient and discussed importance of medication adherence Counseled on importance of regular laboratory monitoring as prescribed Discussed plans with patient for ongoing care management follow up and provided patient with direct contact information for care management team Provided patient with written educational materials related to hypo and hyperglycemia and importance of correct treatment Advised patient, providing education and rationale, to check cbg and record, calling the office for findings outside established parameters Review of patient status, including review of consultants reports, relevant laboratory and other test results, and medications completed Active listening / Reflection utilized  Emotional Support Provided Problem [place] strategies reviewed        SDOH assessments and interventions completed:  No     Care Coordination Interventions:  Yes, provided   Follow up plan:  Will wait  3-4 days for follow up call by the patient.  If not call I will reach out to  the patient.    Encounter Outcome:  Pt. Visit Completed   Lazaro Arms RN, BSN, [place] [facility]   Phone: [phone]

## 2022-03-24 NOTE — Patient Instructions (Signed)
Visit Information  Thank you for taking time to visit with me today. Please don't hesitate to contact me if I can be of assistance to you.   Following are the goals we discussed today:   Goals Addressed             This Visit's Progress    I want to manage my chronic conditions       Care Coordination Interventions: Provided education to patient about basic DM disease process Reviewed medications with patient and discussed importance of medication adherence Counseled on importance of regular laboratory monitoring as prescribed Discussed plans with patient for ongoing care management follow up and provided patient with direct contact information for care management team Provided patient with written educational materials related to hypo and hyperglycemia and importance of correct treatment Advised patient, providing education and rationale, to check cbg and record, calling the office for findings outside established parameters Review of patient status, including review of consultants reports, relevant laboratory and other test results, and medications completed Active listening / Reflection utilized  Emotional Support Provided Problem [place] strategies reviewed        Our next appointment is  If no return call RNCM will follow up with the patient  on.  on 12/5 at 1015 am  Please call the care guide team at [phone] if you need to cancel or reschedule your appointment.   If you are experiencing a Mental Health or [place] or need someone to talk to, please call 1-800-273-TALK (toll free, 24 hour hotline)  Patient verbalizes understanding of instructions and care plan provided today and agrees to view in [place]. Active MyChart status and patient understanding of how to access instructions and care plan via MyChart confirmed with patient.     Lazaro Arms RN, BSN, [place] [facility]   Phone: [phone]

## 2022-03-25 IMAGING — CT CT ABD-PELV W/ CM
2 of 5 series · 16 of 46 positions shown, 18 images · IV contrast (omnipaque)
Comparison: CT abdomen pelvis 04/21/2017

CLINICAL DATA: Abdominal pain, nausea and vomiting.

EXAM:
CT ABDOMEN AND PELVIS WITH CONTRAST
TECHNIQUE: Multidetector CT imaging of the abdomen and pelvis was performed
using the standard protocol following bolus administration of
intravenous contrast.
CONTRAST:  100mL OMNIPAQUE IOHEXOL 300 MG/ML  SOLN

[Series 3: abdomen 5.0 · axial · 0.98mm/px · z∈[+831,+1266]mm · 13 of 103 slices shown, 15 images]
[im 8/103  soft-tissue]
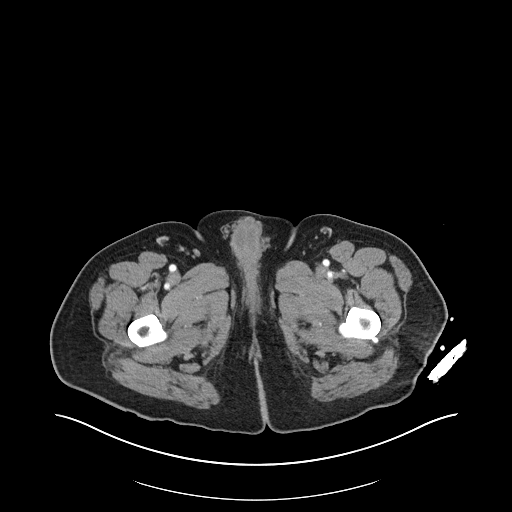
[im 8/103  bone]
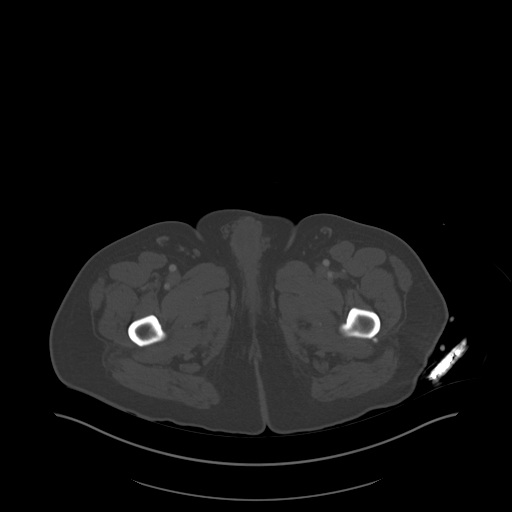
[im 15/103  soft-tissue]
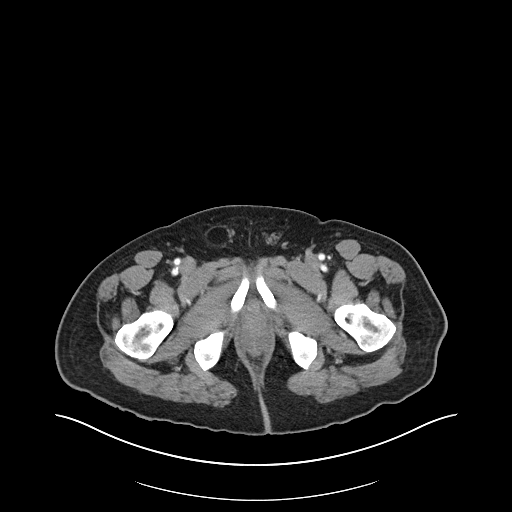
[im 22/103  soft-tissue]
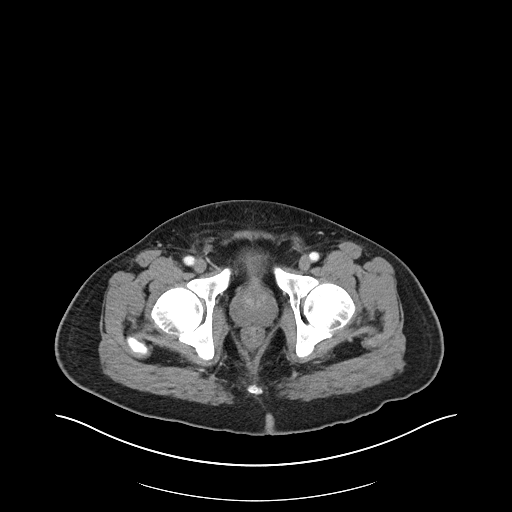
[im 30/103  soft-tissue]
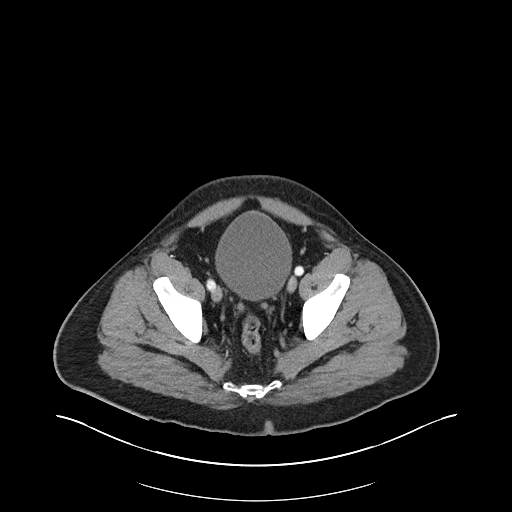
[im 37/103  soft-tissue]
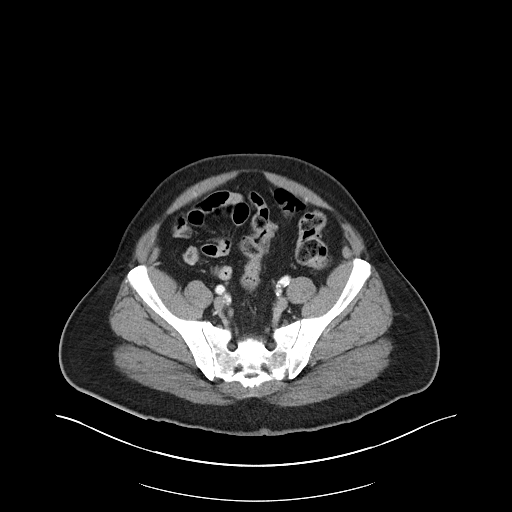
[im 44/103  soft-tissue]
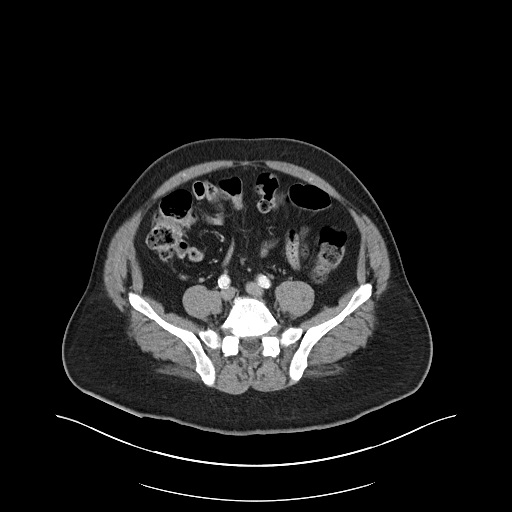
[im 52/103  soft-tissue]
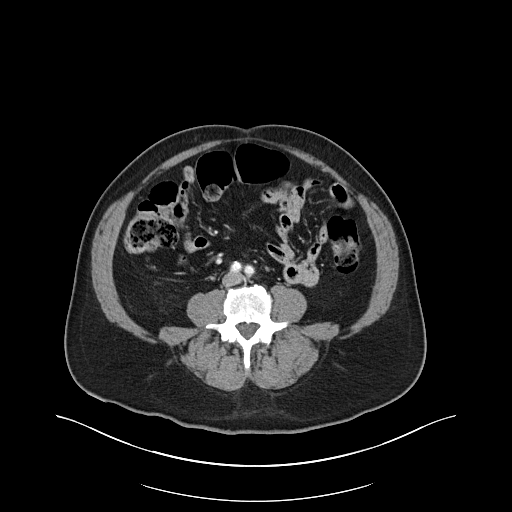
[im 59/103  soft-tissue]
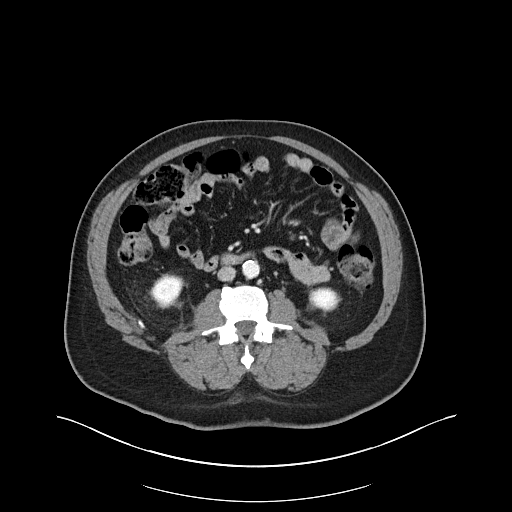
[im 66/103  soft-tissue]
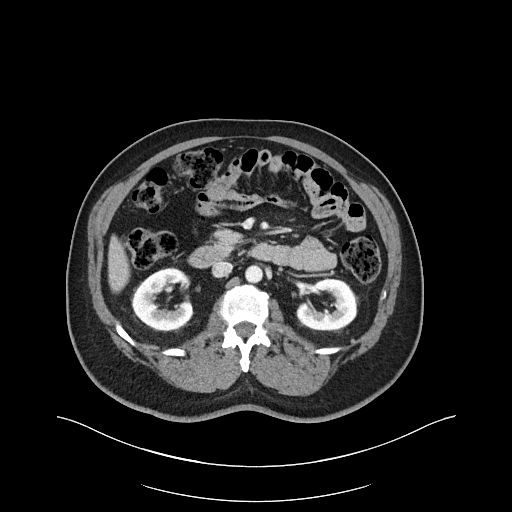
[im 66/103  bone]
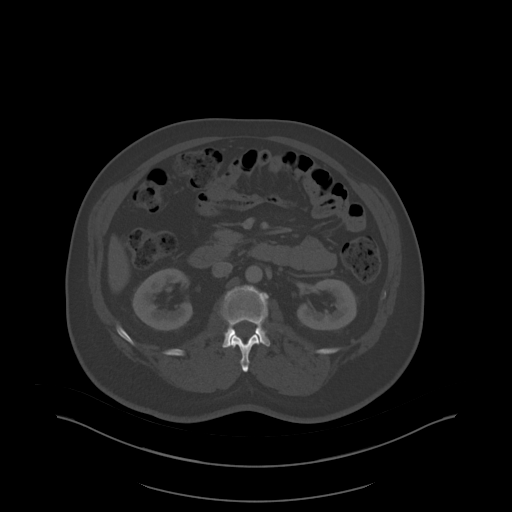
[im 73/103  soft-tissue]
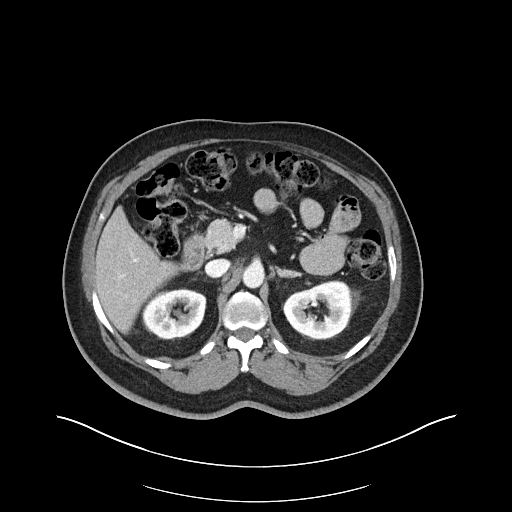
[im 81/103  soft-tissue]
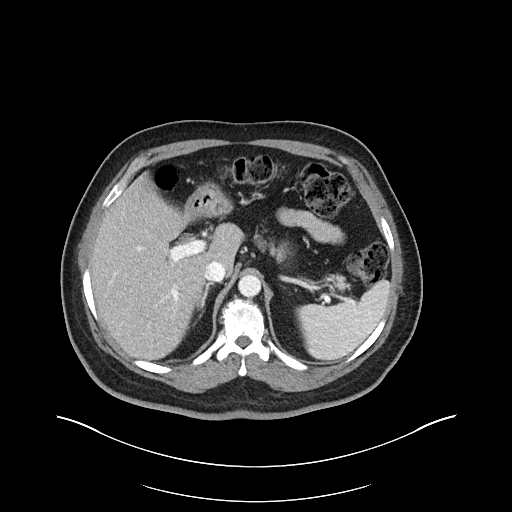
[im 88/103  soft-tissue]
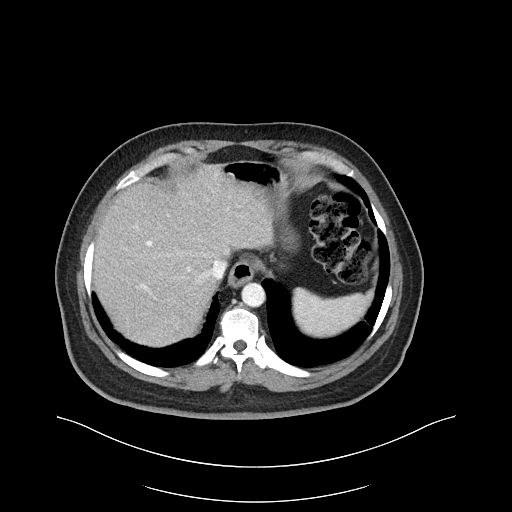
[im 95/103  soft-tissue]
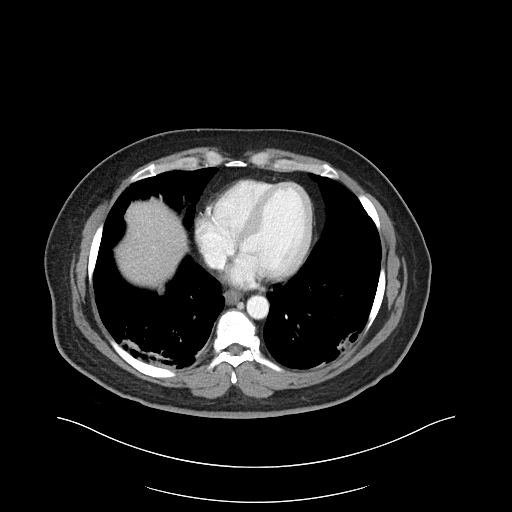

[Series 6: abdomen 3.0 mpr cor · coronal · 0.86mm/px · 3 of 122 slices shown]
[im 41/122  soft-tissue]
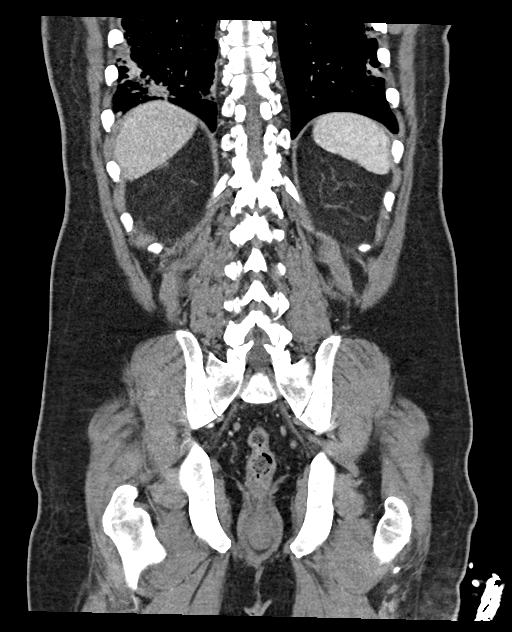
[im 54/122  soft-tissue]
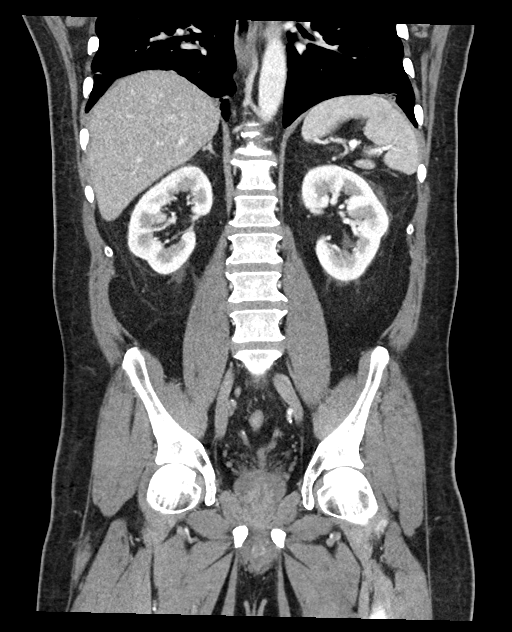
[im 68/122  soft-tissue]
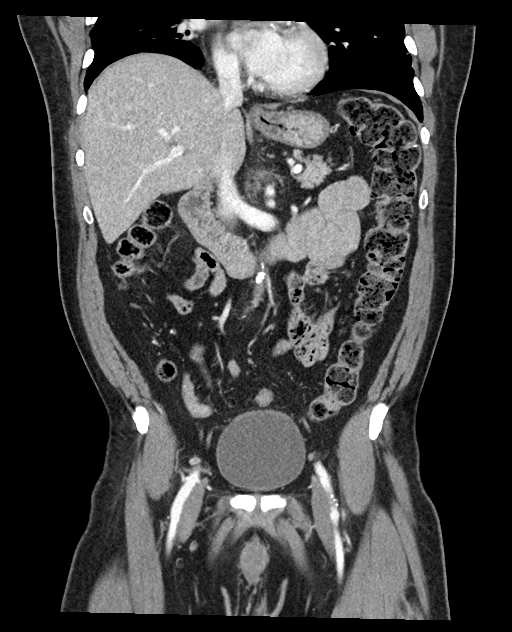

[16 of 46 positions shown; findings below may reference images not displayed]

FINDINGS: Lower chest: Normal heart size. Sharply marginated patchy areas of
consolidation are demonstrated within the lower lungs bilaterally.
No pleural effusion or pneumothorax.

Hepatobiliary: Liver is normal in size and contour. Gallbladder is
unremarkable. No intrahepatic or extrahepatic biliary ductal
dilatation.

Pancreas: Unremarkable

Spleen: Unremarkable

Adrenals/Urinary Tract: Stable 13 mm left adrenal gland nodule most
compatible with adenoma (image 28; series 3). Normal right adrenal
gland. Kidneys enhance symmetrically with contrast. No
hydronephrosis. Urinary bladder is unremarkable.

Stomach/Bowel: No abnormal bowel wall thickening or evidence for
bowel obstruction. Normal morphology of the stomach. No free fluid
or free intraperitoneal air.

Vascular/Lymphatic: Normal caliber abdominal aorta. Peripheral
calcified atherosclerotic plaque. No retroperitoneal
lymphadenopathy.

Reproductive: Heterogeneous prostate.

Other: Small fat containing right inguinal hernia. Within the upper
abdominal fat scratch the within the upper abdominal pre aortic fat
adjacent to the celiac axis there is a small amount of fat stranding
which is nonspecific in etiology (image 28; series 3).

Musculoskeletal: Lower thoracic and lumbar spine degenerative
changes. No aggressive or acute appearing osseous lesions.
IMPRESSION: 1. Sharply marginated patchy areas of consolidation within the lower
lungs bilaterally which may represent multifocal infection,
potentially secondary to 1H2V5-DB.
2. Small amount of nonspecific fat stranding within the upper
abdomen adjacent to the celiac axis. This may be secondary to an
infectious process. Given the appearance, recommend follow-up
abdominal CT in 2-4 weeks to assess for interval change. Consider
clinical and laboratory correlation.

## 2022-03-29 ENCOUNTER — Telehealth: Payer: Self-pay

## 2022-03-29 NOTE — Patient Outreach (Signed)
  Care Coordination   03/29/2022 Name: [name] MRN: [id number] DOB: Nov 03, 1950   Care Coordination Outreach Attempts:  An unsuccessful telephone outreach was attempted today to offer the patient information about available care coordination services as a benefit of their health plan.   Follow Up Plan:  Additional outreach attempts will be made to offer the patient care coordination information and services.   Encounter Outcome:  No Answer   Care Coordination Interventions:  No, not indicated    Lazaro Arms RN, BSN, [place] [facility]   Phone: [phone]

## 2022-04-01 ENCOUNTER — Telehealth: Payer: Self-pay | Admitting: *Deleted

## 2022-04-01 NOTE — Progress Notes (Signed)
  Care Coordination Note  04/01/2022 Name: [name] MRN: [id number] DOB: 01/08/1951  [name] is a 71 y.o. year old male who is a primary care patient of Lilland, Alana, DO and is actively engaged with the care management team. I reached out to [name] by phone today to assist with re-scheduling a follow up visit with the RN Case Manager  Follow up plan: Unsuccessful telephone outreach attempt made. A HIPAA compliant phone message was left for the patient providing contact information and requesting a return call.  We have been unable to make contact with the patient for follow up. The care management team is available to follow up with the patient after provider conversation with the patient regarding recommendation for care management engagement and subsequent re-referral to the care management team.   [place]  Direct Dial: [phone]

## 2022-04-21 ENCOUNTER — Ambulatory Visit
Admission: RE | Admit: 2022-04-21 | Discharge: 2022-04-21 | Disposition: A | Discharge status: Home / Self Care | Payer: Medicare HMO | Source: Ambulatory Visit | Attending: Nephrology | Admitting: Nephrology

## 2022-04-21 DIAGNOSIS — N189 Chronic kidney disease, unspecified: Secondary | ICD-10-CM | POA: Diagnosis not present

## 2022-04-21 DIAGNOSIS — N1831 Chronic kidney disease, stage 3a: Secondary | ICD-10-CM

## 2022-04-28 ENCOUNTER — Other Ambulatory Visit: Payer: Self-pay | Admitting: Family Medicine

## 2022-04-28 DIAGNOSIS — I10 Essential (primary) hypertension: Secondary | ICD-10-CM

## 2022-05-02 ENCOUNTER — Telehealth (HOSPITAL_COMMUNITY): Payer: Self-pay | Admitting: General Practice

## 2022-05-02 NOTE — Telephone Encounter (Signed)
Patient cancelled echocardiogram scheduled for 05/03/22, and does not want to reschedule. Order will be removed from the echo WQ. If patient calls back to reschedule we will reinstate the order. Thank you.

## 2022-05-03 ENCOUNTER — Other Ambulatory Visit (HOSPITAL_COMMUNITY): Payer: Medicare HMO

## 2022-05-04 ENCOUNTER — Telehealth: Payer: Self-pay

## 2022-05-04 NOTE — Telephone Encounter (Signed)
Informed wife that basaglar shipped here to the office from [facility].  1 box of basaglar labeled and ready in med room fridge. Pen needles also labeled on counter

## 2022-05-05 ENCOUNTER — Telehealth: Payer: Self-pay

## 2022-05-05 ENCOUNTER — Encounter: Payer: Self-pay | Admitting: Family Medicine

## 2022-05-05 NOTE — Telephone Encounter (Signed)
Received notification from [place] regarding RE-ENROLLMENT approval for [facility]. Patient assistance approved from 04/25/22 to 04/25/23.  Medication ships to office for pickup.  Phone: [phone]

## 2022-05-19 ENCOUNTER — Ambulatory Visit: Payer: Medicare HMO | Admitting: Internal Medicine

## 2022-05-23 ENCOUNTER — Telehealth: Payer: Self-pay | Admitting: Family Medicine

## 2022-05-23 NOTE — Telephone Encounter (Signed)
Left message for patient to call back and schedule Medicare Annual Wellness Visit (AWV).   Please offer to do virtually or by telephone.  Left office number and my jabber [phone].  Last AWV:07/21/2020   Please schedule at anytime with Nurse Health Advisor.

## 2022-07-12 ENCOUNTER — Telehealth: Payer: Self-pay

## 2022-07-12 NOTE — Telephone Encounter (Signed)
Left message informing patient his [facility] shipment is ready for pickup.  2 boxes of basaglar kwikpens are ready and labeled in med room

## 2022-07-15 NOTE — Telephone Encounter (Signed)
Patient presents to clinic for med pick up.   Provided with shipment per note from [place].   Talbot Grumbling, RN

## 2022-08-01 ENCOUNTER — Other Ambulatory Visit: Payer: Self-pay | Admitting: Family Medicine

## 2022-08-01 DIAGNOSIS — I10 Essential (primary) hypertension: Secondary | ICD-10-CM

## 2022-08-01 DIAGNOSIS — E119 Type 2 diabetes mellitus without complications: Secondary | ICD-10-CM

## 2022-08-23 ENCOUNTER — Telehealth: Payer: Self-pay | Admitting: Family Medicine

## 2022-08-23 NOTE — Telephone Encounter (Signed)
Contacted [name] to schedule their annual wellness visit. Appointment made for 08/29/2022.  Thank you,  [facility] Support [facility] Direct dial  [phone]

## 2022-08-29 ENCOUNTER — Ambulatory Visit (INDEPENDENT_AMBULATORY_CARE_PROVIDER_SITE_OTHER): Payer: Medicare HMO

## 2022-08-29 VITALS — Ht 65.0 in | Wt 198.0 lb

## 2022-08-29 DIAGNOSIS — Z0000 Encounter for general adult medical examination without abnormal findings: Secondary | ICD-10-CM | POA: Diagnosis not present

## 2022-08-29 NOTE — Patient Instructions (Signed)
[name] , Thank you for taking time to come for your Medicare Wellness Visit. I appreciate your ongoing commitment to your health goals. Please review the following plan we discussed and let me know if I can assist you in the future.   These are the goals we discussed:  Goals      DIET - INCREASE WATER INTAKE     Remain active and independent        This is a list of the screening recommended for you and due dates:  Health Maintenance  Topic Date Due   COVID-19 Vaccine (1) Never done   Colon Cancer Screening  Never done   Zoster (Shingles) Vaccine (1 of 2) Never done   Complete foot exam   03/13/2019   Eye exam for diabetics  06/17/2021   Hemoglobin A1C  04/25/2022   Yearly kidney health urinalysis for diabetes  08/24/2022   Flu Shot  11/24/2022   Yearly kidney function blood test for diabetes  02/18/2023   Medicare Annual Wellness Visit  08/29/2023   DTaP/Tdap/Td vaccine (2 - Td or Tdap) 05/14/2028   Pneumonia Vaccine  Completed   Hepatitis C Screening: USPSTF Recommendation to screen - Ages 74-79 yo.  Completed   HPV Vaccine  Aged Out    Advanced directives: Forms are available if you choose in the future to pursue completion.  This is recommended in order to make sure that your health wishes are honored in the event that you are unable to verbalize them to the provider.    Conditions/risks identified: Aim for 30 minutes of exercise or brisk walking, 6-8 glasses of water, and 5 servings of fruits and vegetables each day.  Next appointment: Follow up in one year for your annual wellness visit.   Preventive Care 23 Years and Older, Male  Preventive care refers to lifestyle choices and visits with your health care provider that can promote health and wellness. What does preventive care include? A yearly physical exam. This is also called an annual well check. Dental exams once or twice a year. Routine eye exams. Ask your health care provider how often you should have your  eyes checked. Personal lifestyle choices, including: Daily care of your teeth and gums. Regular physical activity. Eating a healthy diet. Avoiding tobacco and drug use. Limiting alcohol use. Practicing safe sex. Taking low doses of aspirin every day. Taking vitamin and mineral supplements as recommended by your health care provider. What happens during an annual well check? The services and screenings done by your health care provider during your annual well check will depend on your age, overall health, lifestyle risk factors, and family history of disease. Counseling  Your health care provider may ask you questions about your: Alcohol use. Tobacco use. Drug use. Emotional well-being. Home and relationship well-being. Sexual activity. Eating habits. History of falls. Memory and ability to understand (cognition). Work and work Astronomer. Screening  You may have the following tests or measurements: Height, weight, and BMI. Blood pressure. Lipid and cholesterol levels. These may be checked every 5 years, or more frequently if you are over 47 years old. Skin check. Lung cancer screening. You may have this screening every year starting at age 23 if you have a 30-pack-year history of smoking and currently smoke or have quit within the past 15 years. Fecal occult blood test (FOBT) of the stool. You may have this test every year starting at age 57. Flexible sigmoidoscopy or colonoscopy. You may have a sigmoidoscopy every  5 years or a colonoscopy every 10 years starting at age 36. Prostate cancer screening. Recommendations will vary depending on your family history and other risks. Hepatitis C blood test. Hepatitis B blood test. Sexually transmitted disease (STD) testing. Diabetes screening. This is done by checking your blood sugar (glucose) after you have not eaten for a while (fasting). You may have this done every 1-3 years. Abdominal aortic aneurysm (AAA) screening. You may need  this if you are a current or former smoker. Osteoporosis. You may be screened starting at age 96 if you are at high risk. Talk with your health care provider about your test results, treatment options, and if necessary, the need for more tests. Vaccines  Your health care provider may recommend certain vaccines, such as: Influenza vaccine. This is recommended every year. Tetanus, diphtheria, and acellular pertussis (Tdap, Td) vaccine. You may need a Td booster every 10 years. Zoster vaccine. You may need this after age 76. Pneumococcal 13-valent conjugate (PCV13) vaccine. One dose is recommended after age 53. Pneumococcal polysaccharide (PPSV23) vaccine. One dose is recommended after age 49. Talk to your health care provider about which screenings and vaccines you need and how often you need them. This information is not intended to replace advice given to you by your health care provider. Make sure you discuss any questions you have with your health care provider. Document Released: 05/08/2015 Document Revised: 12/30/2015 Document Reviewed: 02/10/2015 Elsevier Interactive Patient Education  2017 [employer].  Fall Prevention in the Home Falls can cause injuries. They can happen to people of all ages. There are many things you can do to make your home safe and to help prevent falls. What can I do on the outside of my home? Regularly fix the edges of walkways and driveways and fix any cracks. Remove anything that might make you trip as you walk through a door, such as a raised step or threshold. Trim any bushes or trees on the path to your home. Use bright outdoor lighting. Clear any walking paths of anything that might make someone trip, such as rocks or tools. Regularly check to see if handrails are loose or broken. Make sure that both sides of any steps have handrails. Any raised decks and porches should have guardrails on the edges. Have any leaves, snow, or ice cleared regularly. Use  sand or salt on walking paths during winter. Clean up any spills in your garage right away. This includes oil or grease spills. What can I do in the bathroom? Use night lights. Install grab bars by the toilet and in the tub and shower. Do not use towel bars as grab bars. Use non-skid mats or decals in the tub or shower. If you need to sit down in the shower, use a plastic, non-slip stool. Keep the floor dry. Clean up any water that spills on the floor as soon as it happens. Remove soap buildup in the tub or shower regularly. Attach bath mats securely with double-sided non-slip rug tape. Do not have throw rugs and other things on the floor that can make you trip. What can I do in the bedroom? Use night lights. Make sure that you have a light by your bed that is easy to reach. Do not use any sheets or blankets that are too big for your bed. They should not hang down onto the floor. Have a firm chair that has side arms. You can use this for support while you get dressed. Do not have throw  rugs and other things on the floor that can make you trip. What can I do in the kitchen? Clean up any spills right away. Avoid walking on wet floors. Keep items that you use a lot in easy-to-reach places. If you need to reach something above you, use a strong step stool that has a grab bar. Keep electrical cords out of the way. Do not use floor polish or wax that makes floors slippery. If you must use wax, use non-skid floor wax. Do not have throw rugs and other things on the floor that can make you trip. What can I do with my stairs? Do not leave any items on the stairs. Make sure that there are handrails on both sides of the stairs and use them. Fix handrails that are broken or loose. Make sure that handrails are as long as the stairways. Check any carpeting to make sure that it is firmly attached to the stairs. Fix any carpet that is loose or worn. Avoid having throw rugs at the top or bottom of the  stairs. If you do have throw rugs, attach them to the floor with carpet tape. Make sure that you have a light switch at the top of the stairs and the bottom of the stairs. If you do not have them, ask someone to add them for you. What else can I do to help prevent falls? Wear shoes that: Do not have high heels. Have rubber bottoms. Are comfortable and fit you well. Are closed at the toe. Do not wear sandals. If you use a stepladder: Make sure that it is fully opened. Do not climb a closed stepladder. Make sure that both sides of the stepladder are locked into place. Ask someone to hold it for you, if possible. Clearly mark and make sure that you can see: Any grab bars or handrails. First and last steps. Where the edge of each step is. Use tools that help you move around (mobility aids) if they are needed. These include: Canes. Walkers. Scooters. Crutches. Turn on the lights when you go into a dark area. Replace any light bulbs as soon as they burn out. Set up your furniture so you have a clear path. Avoid moving your furniture around. If any of your floors are uneven, fix them. If there are any pets around you, be aware of where they are. Review your medicines with your doctor. Some medicines can make you feel dizzy. This can increase your chance of falling. Ask your doctor what other things that you can do to help prevent falls. This information is not intended to replace advice given to you by your health care provider. Make sure you discuss any questions you have with your health care provider. Document Released: 02/05/2009 Document Revised: 09/17/2015 Document Reviewed: 05/16/2014 Elsevier Interactive Patient Education  2017 [employer].

## 2022-08-29 NOTE — Progress Notes (Signed)
Subjective:   [name] is a 72 y.o. male who presents for Medicare Annual/Subsequent preventive examination.  I connected with  [name] on 08/29/22 by a audio enabled telemedicine application and verified that I am speaking with the correct person using two identifiers.  Patient Location: Home  Provider Location: Home Office  I discussed the limitations of evaluation and management by telemedicine. The patient expressed understanding and agreed to proceed.  Review of Systems     Cardiac Risk Factors include: advanced age (>48men, >59 women);diabetes mellitus;dyslipidemia;hypertension;male gender     Objective:    Today's Vitals   08/29/22 1517  Weight: 198 lb (89.8 kg)  Height: 5\' 5"  (1.651 m)   Body mass index is 32.95 kg/m.     08/29/2022    4:14 PM 02/17/2022    2:51 PM 02/09/2022    3:00 PM 02/02/2022   10:28 AM 01/23/2022    1:46 PM 08/23/2021    1:54 PM 10/16/2020    2:56 PM  Advanced Directives  Does Patient Have a Medical Advance Directive? No No Yes No No No Yes  Does patient want to make changes to medical advance directive?   No - Patient declined    No - Patient declined  Would patient like information on creating a medical advance directive? No - Patient declined No - Patient declined  No - Patient declined No - Patient declined No - Patient declined     Current Medications (verified) Outpatient Encounter Medications as of 08/29/2022  Medication Sig   Accu-Chek Softclix Lancets lancets USE AS DIRECTED UP TO FOUR TIMES DAILY   aspirin EC 81 MG tablet Take 81 mg by mouth daily.   baclofen (LIORESAL) 10 MG tablet Take 1 tablet (10 mg total) by mouth at bedtime as needed for muscle spasms.   blood glucose meter kit and supplies Dispense based on patient and insurance preference. Use up to four times daily as directed. (FOR ICD-10 E10.9, E11.9).   carvedilol (COREG) 12.5 MG tablet Take 1 tablet (12.5 mg total) by mouth 2 (two) times daily with a meal. (Patient  taking differently: Take 25 mg by mouth 2 (two) times daily with a meal.)   cyclobenzaprine (FLEXERIL) 5 MG tablet Take 1 tablet (5 mg total) by mouth at bedtime.   empagliflozin (JARDIANCE) 10 MG TABS tablet Take 1 tablet (10 mg total) by mouth daily.   glucose blood (ACCU-CHEK GUIDE) test strip Use as instructed   insulin aspart (FIASP FLEXTOUCH) 100 UNIT/ML FlexTouch Pen Inject 10 Units into the skin daily before supper.   Insulin Glargine (BASAGLAR KWIKPEN) 100 UNIT/ML SOPN Inject 0.3 mLs (30 Units total) into the skin daily.   Insulin Pen Needle (BD PEN NEEDLE NANO 2ND GEN) 32G X 4 MM MISC Inject 1 Container into the skin 3 times/day as needed-between meals & bedtime. USE AS DIRECTED DAILY   lidocaine (LIDODERM) 5 % Place 1 patch onto the skin daily. Remove & Discard patch within 12 hours or as directed by MD   polyethylene glycol powder (GLYCOLAX/MIRALAX) 17 GM/SCOOP powder Take 1 capful (17 g) with water by mouth daily.   rosuvastatin (CRESTOR) 40 MG tablet TAKE 1 TABLET(40 MG) BY MOUTH DAILY (Patient taking differently: Take 40 mg by mouth daily.)   amLODipine (NORVASC) 10 MG tablet Take 1 tablet (10 mg total) by mouth daily for 15 days. You are OVERDUE for an appointment. Please schedule an appointment for additional refills. (Patient not taking: Reported on 08/29/2022)   metFORMIN (  GLUCOPHAGE) 500 MG tablet Take 2 tablets (1,000 mg total) by mouth 2 (two) times daily with a meal for 15 days. You are OVERDUE for an appointment. Please schedule an appointment for additional refills.   No facility-administered encounter medications on file as of 08/29/2022.    Allergies (verified) Trulicity [dulaglutide]   History: Past Medical History:  Diagnosis Date   Diabetes mellitus without complication (HCC)    Hypercholesterolemia    Hypertension    MI (myocardial infarction) (HCC)    Past Surgical History:  Procedure Laterality Date   CORONARY ANGIOPLASTY WITH STENT PLACEMENT  08/01/2003    Proximal LAD   Family History  Problem Relation Age of Onset   Heart disease Mother    Diabetes Mother    Hypertension Mother    Alcoholism Father    Heart disease Father    Hypertension Sister    Diabetes Brother    Hypercholesterolemia Brother    Osteoporosis Brother    Hypertension Brother    Kidney disease Brother    Social History   Socioeconomic History   Marital status: Married    Spouse name: Elnita Maxwell   Number of children: 4   Years of education: 10   Highest education level: 10th grade  Occupational History   Occupation: retired  Tobacco Use   Smoking status: Former    Packs/day: 0.50    Years: 10.00    Additional pack years: 0.00    Total pack years: 5.00    Types: Cigarettes    Quit date: 04/25/1993    Years since quitting: 29.3   Smokeless tobacco: Never  Vaping Use   Vaping Use: Never used  Substance and Sexual Activity   Alcohol use: No   Drug use: No   Sexual activity: Yes  Other Topics Concern   Not on file  Social History Narrative   Patient lives with his wife in [place].   Patent has 4 boys, one lives in [place].    Patient enjoys fishing, camping in the woods, working outside, anything outdoor related.   Social Determinants of Health   Financial Resource Strain: Low Risk  (08/29/2022)   Overall Financial Resource Strain (CARDIA)    Difficulty of Paying Living Expenses: Not hard at all  Food Insecurity: No Food Insecurity (08/29/2022)   Hunger Vital Sign    Worried About Running Out of Food in the Last Year: Never true    Ran Out of Food in the Last Year: Never true  Transportation Needs: No Transportation Needs (08/29/2022)   PRAPARE - Administrator, Civil Service (Medical): No    Lack of Transportation (Non-Medical): No  Physical Activity: Insufficiently Active (08/29/2022)   Exercise Vital Sign    Days of Exercise per Week: 3 days    Minutes of Exercise per Session: 30 min  Stress: No Stress Concern Present (08/29/2022)    [employer] of Occupational Health - Occupational Stress Questionnaire    Feeling of Stress : Not at all  Social Connections: Moderately Integrated (08/29/2022)   Social Connection and Isolation Panel [NHANES]    Frequency of Communication with Friends and Family: More than three times a week    Frequency of Social Gatherings with Friends and Family: More than three times a week    Attends Religious Services: 1 to 4 times per year    Active Member of [employer] or Organizations: No    Attends Banker Meetings: Never    Marital Status: Married  Tobacco Counseling Counseling given: Not Answered   Clinical Intake:  Pre-visit preparation completed: Yes  Pain : No/denies pain  Diabetes: Yes CBG done?: No Did pt. bring in CBG monitor from home?: No  How often do you need to have someone help you when you read instructions, pamphlets, or other written materials from your doctor or pharmacy?: 4 - Often  Diabetic?Yes   Nutrition Risk Assessment:  Has the patient had any N/V/D within the last 2 months?  No  Does the patient have any non-healing wounds?  No  Has the patient had any unintentional weight loss or weight gain?  No   Diabetes:  Is the patient diabetic?  Yes  If diabetic, was a CBG obtained today?  No  Did the patient bring in their glucometer from home?  No  How often do you monitor your CBG's? As needed .   Financial Strains and Diabetes Management:  Are you having any financial strains with the device, your supplies or your medication? No . Receives assistance with Basaglar  Does the patient want to be seen by Chronic Care Management for management of their diabetes?  No  Would the patient like to be referred to a Nutritionist or for Diabetic Management?  No   Diabetic Exams:  Diabetic Eye Exam: Overdue for diabetic eye exam. Pt has been advised about the importance in completing this exam. Patient advised to call and schedule an eye exam. Diabetic  Foot Exam: Overdue, Pt has been advised about the importance in completing this exam. Pt is scheduled for diabetic foot exam on at next office visit .   Interpreter Needed?: No  Information entered by :: Kandis Fantasia LPN   Activities of Daily Living    08/29/2022    4:14 PM 01/23/2022    1:46 PM  In your present state of health, do you have any difficulty performing the following activities:  Hearing? 0 0  Vision? 0 0  Difficulty concentrating or making decisions? 0 0  Walking or climbing stairs? 0 0  Dressing or bathing? 0 0  Doing errands, shopping? 0 0  Preparing Food and eating ? N   Using the Toilet? N   In the past six months, have you accidently leaked urine? N   Do you have problems with loss of bowel control? N   Managing your Medications? N   Managing your Finances? N   Housekeeping or managing your Housekeeping? N     Patient Care Team: Evelena Leyden, DO as PCP - General (Family Medicine) Rennis Golden Lisette Abu, MD as PCP - Cardiology (Cardiology) Juanell Fairly, RN as [facility] Care Management  Indicate any recent Medical Services you may have received from other than [facility] providers in the past year (date may be approximate).     Assessment:   This is a routine wellness examination for [place].  Hearing/Vision screen Hearing Screening - Comments:: Denies hearing difficulties   Vision Screening - Comments:: Wears rx glasses- will schedule routine eye exam soon     Dietary issues and exercise activities discussed: Current Exercise Habits: Home exercise routine, Type of exercise: walking;stretching, Time (Minutes): 30, Frequency (Times/Week): 3, Weekly Exercise (Minutes/Week): 90, Intensity: Mild   Goals Addressed               This Visit's Progress     COMPLETED: I want to manage my chronic conditions        Care Coordination Interventions: Provided education to patient about basic DM disease  process Reviewed medications with patient and  discussed importance of medication adherence Counseled on importance of regular laboratory monitoring as prescribed Discussed plans with patient for ongoing care management follow up and provided patient with direct contact information for care management team Provided patient with written educational materials related to hypo and hyperglycemia and importance of correct treatment Advised patient, providing education and rationale, to check cbg and record, calling the office for findings outside established parameters Review of patient status, including review of consultants reports, relevant laboratory and other test results, and medications completed Active listening / Reflection utilized  Emotional Support Provided Problem Solving /Task Center strategies reviewed      COMPLETED: I would like to optimize my diabetes management (pt-stated)        Current Barriers:  Diabetes: T2DM; complicated by chronic medical conditions including HTN, HLD most recent A1c 8.1% on 10/13 Current antihyperglycemic regimen: Basaglar 30 units, Jardiance 25mg , glipizde, metformin Instructions given for patient assistance process Patient tolerating well 06/11/19-Patient approved for Basaglar-pt to call [facility] [phone] to set up shipment Denies hypoglycemic symptoms, Denies hyperglycemic symptoms Current meal patterns: Breakfast: eggs, bacon, 1 piece of toast Lunch: not often Supper: fried chicken/protein, greens/veggie, discuss light carb is ok Snacks: veggies/protein Drinks: coffee with light creamer (no sugar), discussed avoiding juices/sugary drinks/sodas Current exercise: walking (recent knee surgery, but recovering) Current blood glucose readings: 108-127 Cardiovascular risk reduction: ASCVD risk score is >20%  - high intensity statin/ASA as below Current hypertensive regimen: amlodipine, chlorthalidone, losartan (BP goal <130/80 mmHg.) Current hyperlipidemia regimen: rosuvastatin 20mg   daily Current antiplatelet regimen: ASA 81  Pharmacist Clinical Goal(s):  Over the next 90 days, patient will work with PharmD and primary care provider to address needs related to optimized medication management of diabetes  Interventions: Comprehensive medication review performed, medication list updated in electronic medical record Reviewed & discussed the following diabetes-related information with patient: Reviewed ADA recommended "diabetes-friendly" diet  (reviewed healthy snack/food options) Discussed insulin injection technique; Patient uses AccuCheck glucometer Reviewed medication purpose/side effects-->patient denies adverse events  Patient Self Care Activities:  Patient will check blood glucose daily (or if symptomatic), document, and provide at future appointments Patient will focus on medication adherence  Patient will take medications as prescribed Patient will contact provider with any episodes of hypoglycemia Patient will report any questions or concerns to provider   Please see past updates related to this goal by clicking on the "Past Updates" button in the selected goal        Remain active and independent        Depression Screen    08/29/2022    4:12 PM 02/17/2022    2:51 PM 02/02/2022   10:29 AM 08/23/2021    1:54 PM 09/16/2020    2:14 PM 08/19/2020   10:37 AM 08/14/2020    3:15 PM  PHQ 2/9 Scores  PHQ - 2 Score 0 3  3 3  0 0  PHQ- 9 Score  10  4 4  0 0  Exception Documentation   Patient refusal        Fall Risk    08/29/2022    4:11 PM 02/03/2022    2:13 PM 02/02/2022   10:29 AM 08/23/2021    1:54 PM 09/16/2020    2:14 PM  Fall Risk   Falls in the past year? 0  1 0 0  Comment  Stepped off a curb and fell and landed on his stomach     Number falls in past yr: 0  0 0 0 0  Injury with Fall? 0 0 0 0 0  Risk for fall due to : No Fall Risks      Follow up Falls prevention discussed;Education provided;Falls evaluation completed        FALL RISK PREVENTION  PERTAINING TO THE HOME:  Any stairs in or around the home? No  If so, are there any without handrails? No  Home free of loose throw rugs in walkways, pet beds, electrical cords, etc? Yes  Adequate lighting in your home to reduce risk of falls? Yes   ASSISTIVE DEVICES UTILIZED TO PREVENT FALLS:  Life alert? No  Use of a cane, walker or w/c? No  Grab bars in the bathroom? Yes  Shower chair or bench in shower? No  Elevated toilet seat or a handicapped toilet? Yes   TIMED UP AND GO:  Was the test performed? No . Telephonic visit   Cognitive Function:        08/29/2022    4:15 PM 07/21/2020    1:52 PM  6CIT Screen  What Year? 0 points 0 points  What month? 0 points 0 points  What time? 0 points 0 points  Count back from 20 0 points 2 points  Months in reverse 0 points 2 points  Repeat phrase 0 points 0 points  Total Score 0 points 4 points    Immunizations Immunization History  Administered Date(s) Administered   Fluad Quad(high Dose 65+) 02/05/2019   Influenza,inj,Quad PF,6+ Mos 03/02/2018, 05/13/2020   Pneumococcal Conjugate-13 05/13/2020   Pneumococcal Polysaccharide-23 05/14/2018   Tdap 05/14/2018    TDAP status: Up to date  Flu Vaccine status: Up to date  Pneumococcal vaccine status: Up to date  Covid-19 vaccine status: Information provided on how to obtain vaccines.   Qualifies for Shingles Vaccine? Yes   Zostavax completed No   Shingrix Completed?: No.    Education has been provided regarding the importance of this vaccine. Patient has been advised to call insurance company to determine out of pocket expense if they have not yet received this vaccine. Advised may also receive vaccine at local pharmacy or Health Dept. Verbalized acceptance and understanding.  Screening Tests Health Maintenance  Topic Date Due   COVID-19 Vaccine (1) Never done   COLONOSCOPY (Pts 45-24yrs Insurance coverage will need to be confirmed)  Never done   Zoster Vaccines- Shingrix  (1 of 2) Never done   FOOT EXAM  03/13/2019   OPHTHALMOLOGY EXAM  06/17/2021   HEMOGLOBIN A1C  04/25/2022   Diabetic kidney evaluation - Urine ACR  08/24/2022   INFLUENZA VACCINE  11/24/2022   Diabetic kidney evaluation - eGFR measurement  02/18/2023   Medicare Annual Wellness (AWV)  08/29/2023   DTaP/Tdap/Td (2 - Td or Tdap) 05/14/2028   Pneumonia Vaccine 89+ Years old  Completed   Hepatitis C Screening  Completed   HPV VACCINES  Aged Out    Health Maintenance  Health Maintenance Due  Topic Date Due   COVID-19 Vaccine (1) Never done   COLONOSCOPY (Pts 45-80yrs Insurance coverage will need to be confirmed)  Never done   Zoster Vaccines- Shingrix (1 of 2) Never done   FOOT EXAM  03/13/2019   OPHTHALMOLOGY EXAM  06/17/2021   HEMOGLOBIN A1C  04/25/2022   Diabetic kidney evaluation - Urine ACR  08/24/2022    Colorectal cancer screening:  Patient sates that he had done in [place] approx 5-6 years ago   Lung Cancer Screening: (Low Dose CT Chest  recommended if Age 33-80 years, 30 pack-year currently smoking OR have quit w/in 15years.) does not qualify.   Lung Cancer Screening Referral: n/a  Additional Screening:  Hepatitis C Screening: does qualify; Completed 08/23/21  Vision Screening: Recommended annual ophthalmology exams for early detection of glaucoma and other disorders of the eye. Is the patient up to date with their annual eye exam?  No  Who is the provider or what is the name of the office in which the patient attends annual eye exams? none If pt is not established with a provider, would they like to be referred to a provider to establish care? No .   Dental Screening: Recommended annual dental exams for proper oral hygiene  Community Resource Referral / Chronic Care Management: CRR required this visit?  No   CCM required this visit?  No      Plan:     I have personally reviewed and noted the following in the patient's chart:   Medical and social history Use  of alcohol, tobacco or illicit drugs  Current medications and supplements including opioid prescriptions. Patient is not currently taking opioid prescriptions. Functional ability and status Nutritional status Physical activity Advanced directives List of other physicians Hospitalizations, surgeries, and ER visits in previous 12 months Vitals Screenings to include cognitive, depression, and falls Referrals and appointments  In addition, I have reviewed and discussed with patient certain preventive protocols, quality metrics, and best practice recommendations. A written personalized care plan for preventive services as well as general preventive health recommendations were provided to patient.     Durwin Nora, [place]   04/30/1094   Due to this being a virtual visit, the after visit summary with patients personalized plan was offered to patient via mail or my-chart.  Patient would like to access on my-chart  Nurse Notes: No concerns

## 2022-08-31 ENCOUNTER — Encounter: Payer: Self-pay | Admitting: Family Medicine

## 2022-08-31 ENCOUNTER — Other Ambulatory Visit: Payer: Self-pay | Admitting: Family Medicine

## 2022-08-31 ENCOUNTER — Ambulatory Visit (INDEPENDENT_AMBULATORY_CARE_PROVIDER_SITE_OTHER): Payer: Medicare HMO | Admitting: Family Medicine

## 2022-08-31 VITALS — BP 194/90 | HR 65 | Ht 65.0 in | Wt 205.0 lb

## 2022-08-31 DIAGNOSIS — E119 Type 2 diabetes mellitus without complications: Secondary | ICD-10-CM

## 2022-08-31 DIAGNOSIS — I10 Essential (primary) hypertension: Secondary | ICD-10-CM | POA: Diagnosis not present

## 2022-08-31 DIAGNOSIS — Z1211 Encounter for screening for malignant neoplasm of colon: Secondary | ICD-10-CM

## 2022-08-31 DIAGNOSIS — Z794 Long term (current) use of insulin: Secondary | ICD-10-CM

## 2022-08-31 LAB — POCT GLYCOSYLATED HEMOGLOBIN (HGB A1C): HbA1c, POC (controlled diabetic range): 13.1 % — AB (ref 0.0–7.0)

## 2022-08-31 MED ORDER — BASAGLAR KWIKPEN 100 UNIT/ML ~~LOC~~ SOPN: 35.0000 [IU] | PEN_INJECTOR | Freq: Every day | SUBCUTANEOUS | Status: DC

## 2022-08-31 MED ORDER — LOSARTAN POTASSIUM 50 MG PO TABS: 50.0000 mg | ORAL_TABLET | Freq: Every day | ORAL | 0 refills | Status: DC

## 2022-08-31 MED ORDER — INSULIN ASPART (W/NIACINAMIDE) 100 UNIT/ML ~~LOC~~ SOPN: PEN_INJECTOR | SUBCUTANEOUS | 1 refills | Status: DC

## 2022-08-31 NOTE — Assessment & Plan Note (Signed)
BP significantly elevated to the 190s/90s today.  Was previously working with cardiology to titrate medications, but has not seen cardiology since October 2023.  Patient currently on amlodipine 10 mg daily, Coreg 12.5 mg twice daily, and Jardiance 10 mg daily.  Previous medications to spironolactone, chlorthalidone, losartan have been discontinued after hospitalization with AKI and 2023. - Continue amlodipine, Coreg and Jardiance, - Start losartan 50 mg daily - BMP today and in 1 week with Dr. Raymondo Band - Discussed return and ER precautions for hypertensive symptoms

## 2022-08-31 NOTE — Progress Notes (Signed)
    SUBJECTIVE:   CHIEF COMPLAINT / HPI:   T2DM - Checking BG at home: 160s-170s, rarely checks - Medications: Jardiance 10 mg, aspart 10 units with supper, glargine 30 units daily, metformin 1000 mg twice daily, rosuvastatin 40 mg - Compliance: Good compliance Lab Results  Component Value Date   LABMICR 3,903.9 08/23/2021  - Denies hypoglycemic symptoms.  Is having hyperglycemic symptoms of polydipsia  HTN: - Medications: amlodipine 10mg  daily, coreg 12.5mg  BID, jardiance 10mg  daily - Compliance: Good compliance - Checking BP at home: No - Denies any SOB, CP, vision changes, LE edema, medication SEs, or symptoms of hypotension  Abdominal Issues - Urinating more, has had accidents with not being able to get there fast enough - No medication changes - Has been taking laxative because he feels like the hernia is stopping the poops  PERTINENT  PMH / PSH: Reviewed  OBJECTIVE:   BP (!) 194/90   Pulse 65   Ht 5\' 5"  (1.651 m)   Wt 205 lb (93 kg)   SpO2 100%   BMI 34.11 kg/m   General: NAD, well-appearing, well-nourished Respiratory: No respiratory distress, breathing comfortably, able to speak in full sentences Skin: warm and dry, no rashes noted on exposed skin Psych: Appropriate affect and mood  ASSESSMENT/PLAN:   Type 2 diabetes mellitus without complication, with long-term current use of insulin (HCC) A1c today 13.1, does not check sugars regularly at home.  Patient having polydipsia and stool issues that could be related to current sugar/hyperglycemic state. - Increase Lantus to 35 units daily - Add additional 5 units of aspart with lunch - Check fasting blood sugar and sugars 2 hours after meals - Urine microalbumin and BMP today - Follow-up with Dr. Raymondo Band on 5/14 - Discussed hypoglycemic symptoms  Essential hypertension, benign BP significantly elevated to the 190s/90s today.  Was previously working with cardiology to titrate medications, but has not seen  cardiology since October 2023.  Patient currently on amlodipine 10 mg daily, Coreg 12.5 mg twice daily, and Jardiance 10 mg daily.  Previous medications to spironolactone, chlorthalidone, losartan have been discontinued after hospitalization with AKI and 2023. - Continue amlodipine, Coreg and Jardiance, - Start losartan 50 mg daily - BMP today and in 1 week with Dr. Raymondo Band - Discussed return and ER precautions for hypertensive symptoms   Healthcare maintenance - Referral for colonoscopy patient provided with numbers for cardiology office and nephrology office to touch base to get appointments soon  Evelena Leyden, DO [facility]

## 2022-08-31 NOTE — Patient Instructions (Addendum)
Diabetes - A1c today was high at 13.1 - Increase your Lantus to 35 Units  - Add a dose of 5 Units of Fiasp (insulin aspart) to your lunch meal - Check your blood sugars in the morning and 2 hours after meals - Keep a log of your sugars - Follow-up in clinic with Dr. Raymondo Band in 1 week  Blood pressure - I am checking your kidneys today - We are adding back Losartan today - Check your blood pressures at home and keep a log if able - We will recheck your blood pressure and labs next week - If having headaches, vision changes, or other concerning symptoms go to the ER    Call [facility] and make an appointment: Address: [address], [place], [place] [address] Phone: [phone]   Call your cardiologist for an appointment  [phone]

## 2022-08-31 NOTE — Assessment & Plan Note (Signed)
A1c today 13.1, does not check sugars regularly at home.  Patient having polydipsia and stool issues that could be related to current sugar/hyperglycemic state. - Increase Lantus to 35 units daily - Add additional 5 units of aspart with lunch - Check fasting blood sugar and sugars 2 hours after meals - Urine microalbumin and BMP today - Follow-up with Dr. Raymondo Band on 5/14 - Discussed hypoglycemic symptoms

## 2022-09-01 ENCOUNTER — Other Ambulatory Visit: Payer: Self-pay | Admitting: Family Medicine

## 2022-09-01 ENCOUNTER — Telehealth: Payer: Self-pay

## 2022-09-01 DIAGNOSIS — E119 Type 2 diabetes mellitus without complications: Secondary | ICD-10-CM

## 2022-09-01 MED ORDER — FIASP FLEXTOUCH 100 UNIT/ML ~~LOC~~ SOPN: PEN_INJECTOR | SUBCUTANEOUS | 1 refills | Status: DC

## 2022-09-01 NOTE — Telephone Encounter (Signed)
Spoke with wife.   She reports confusion on AVS. She reports it states Lantus was increased to 35 units. She reports he is not on Lantus.   Advised most likely Basaglar is being increased to 35 units.   Will check with PCP.

## 2022-09-01 NOTE — Telephone Encounter (Signed)
Wife requests to due to insurance we send in one pen of fiasp instead of two.   She reports one is for dinner (10 units) and one for lunch (5 units.) She reports the pharmacy suggested calling in a new prescription with the directions being 15 units a day. The patient will inject as told.   Will forward to PCP.

## 2022-09-01 NOTE — Telephone Encounter (Signed)
Discussed with wife.   Basaglar 35 units.

## 2022-09-02 LAB — MICROALBUMIN / CREATININE URINE RATIO
Creatinine, Urine: 75 mg/dL
Microalb/Creat Ratio: 4099 mg/g creat — ABNORMAL HIGH (ref 0–29)
Microalbumin, Urine: 3073.9 ug/mL

## 2022-09-02 LAB — BASIC METABOLIC PANEL
BUN/Creatinine Ratio: 17 (ref 10–24)
BUN: 24 mg/dL (ref 8–27)
CO2: 24 mmol/L (ref 20–29)
Calcium: 9 mg/dL (ref 8.6–10.2)
Chloride: 101 mmol/L (ref 96–106)
Creatinine, Ser: 1.44 mg/dL — ABNORMAL HIGH (ref 0.76–1.27)
Glucose: 379 mg/dL — ABNORMAL HIGH (ref 70–99)
Potassium: 4.1 mmol/L (ref 3.5–5.2)
Sodium: 139 mmol/L (ref 134–144)
eGFR: 52 mL/min/{1.73_m2} — ABNORMAL LOW (ref >59)

## 2022-09-06 ENCOUNTER — Ambulatory Visit (INDEPENDENT_AMBULATORY_CARE_PROVIDER_SITE_OTHER): Payer: Medicare HMO | Admitting: Pharmacist

## 2022-09-06 ENCOUNTER — Encounter: Payer: Self-pay | Admitting: Pharmacist

## 2022-09-06 VITALS — BP 186/97 | HR 82 | Ht 66.0 in | Wt 208.0 lb

## 2022-09-06 DIAGNOSIS — E119 Type 2 diabetes mellitus without complications: Secondary | ICD-10-CM | POA: Diagnosis not present

## 2022-09-06 DIAGNOSIS — I10 Essential (primary) hypertension: Secondary | ICD-10-CM | POA: Diagnosis not present

## 2022-09-06 DIAGNOSIS — Z794 Long term (current) use of insulin: Secondary | ICD-10-CM

## 2022-09-06 MED ORDER — FIASP FLEXTOUCH 100 UNIT/ML ~~LOC~~ SOPN: 15.0000 [IU] | PEN_INJECTOR | Freq: Two times a day (BID) | SUBCUTANEOUS | 1 refills | Status: DC

## 2022-09-06 NOTE — Assessment & Plan Note (Signed)
Hypertension longstanding currently uncontrolled. Systolic blood pressure goal of <130 mmHg. Medication adherence variable. Blood pressure control is suboptimal due to high salt diet, not taking blood pressure medications today. -Continued amlodipine 10 mg daily, carvedilol 12.5 mg BID, losartan 50 mg daily,. -Clarified twice daily dosing of carvedilol with patient. -Educated patient on role of dietary salt and hypertension, including benefits of controlled blood pressure on kidney and cardiovascular health. - Asked to reduce intake of "pork rinds" to zero if possible.

## 2022-09-06 NOTE — Assessment & Plan Note (Signed)
Diabetes longstanding currently uncontrolled. Medication adherence appears fair.  -Continued basal insulin Basaglar (insulin glargine) at 35 units daily in the morning.  -Combined rapid insulin Fiasp (insulin aspart) doses of 5 units at bedtime and 10 units with dinner to 15 units with dinner -Added rapid insulin Fiasp (insulin aspart) 15 units with lunch.  -Continued SGLT2-I Jardiance (empagliflozin) 10 mg daily. -Continued metformin 1000 mg BID. Clarified twice daily dosing with patient. -Patient educated on purpose, proper use, and potential adverse effects of medications.  -Extensively discussed pathophysiology of diabetes, recommended lifestyle interventions, dietary effects on blood sugar control.  -Counseled on s/sx of and management of hypoglycemia.  -Next A1c anticipated July 2024.  -Sent patient home with information about downloading FreeStyle Libre 3 app with plan to initiate CGM in clinic next week.

## 2022-09-06 NOTE — Progress Notes (Signed)
S:     Chief Complaint  Patient presents with   Medication Management    Dm/htn follow-up   72 y.o. male who presents for diabetes evaluation, education, and management.  PMH is significant for HTN, T2DM. Patient was referred and last seen by Primary Care Provider, Dr. Clayborne Artist, on 08/31/2022.  At last visit, losartan 50 mg was restarted.   Today, patient arrives in  good spirits and presents without any assistance. Patient called wife to review current medications.  Current diabetes medications include: Basaglar (insulin glargine) 35 units in the morning (11 AM). Fiasp (insulin aspart), patient reports taking 10 units with dinner and 5 units at bedtime. Empagliflozin 10 mg, metformin 1000 BID (taking once daily). Current hypertension medications include: amlodipine 10 mg, carvedilol 12.5 mg BID (taking once daily), losartan 50 mg daily.  Notably, patient had not taken BP meds prior to visit today.  Current hyperlipidemia medications include: rosuvastatin 40 mg.  Patient reports variable adherence with medications. Reports missing lunchtime insulin and sometimes supper insulin. Appears to be taking all medications once daily.  Do you feel that your medications are working for you? yes Have you been experiencing any side effects to the medications prescribed? no Do you have any problems obtaining medications due to transportation or finances? no Insurance coverage: [employer]  Patient denies hypoglycemic events.  Reported 2 hour post-meal/random blood sugars: 230s - Patient checks around 11 am every day. Did not check today.  Patient reports nocturia (nighttime urination). x2weeks Patient denies neuropathy (nerve pain). Patient reports visual changes. Patient reports self foot exams.   Patient reported dietary habits: Eats 2-3 meals/day Snacks: pork rinds (about a half bag) Drinks: coffee, orange juice (~1 cup)  Within the past 12 months, did you worry whether your food would run  out before you got money to buy more? no Within the past 12 months, did the food you bought run out, and you didn't have money to get more? no  O:   Review of Systems  All other systems reviewed and are negative.   Physical Exam Constitutional:      Appearance: Normal appearance.  Neurological:     Mental Status: He is alert.  Psychiatric:        Mood and Affect: Mood normal.        Behavior: Behavior normal.        Thought Content: Thought content normal.      Lab Results  Component Value Date   HGBA1C 13.1 (A) 08/31/2022   Vitals:   09/06/22 1433 09/06/22 1438  BP: (!) 180/97 (!) 186/97  Pulse: 85 82  SpO2: 100%     Lipid Panel     Component Value Date/Time   CHOL 207 (H) 01/23/2022 0430   CHOL 243 (H) 08/23/2021 1458   TRIG 186 (H) 01/23/2022 0430   HDL 66 01/23/2022 0430   HDL 77 08/23/2021 1458   CHOLHDL 3.1 01/23/2022 0430   VLDL 37 01/23/2022 0430   LDLCALC 104 (H) 01/23/2022 0430   LDLCALC 141 (H) 08/23/2021 1458   LDLDIRECT 56 09/10/2020 0947    Clinical Atherosclerotic Cardiovascular Disease (ASCVD): Yes  The 10-year ASCVD risk score (Arnett DK, et al., 2019) is: 75.7%   Values used to calculate the score:     Age: 83 years     Sex: Male     Is Non-Hispanic African American: Yes     Diabetic: Yes     Tobacco smoker: Yes  Systolic Blood Pressure: 186 mmHg     Is BP treated: Yes     HDL Cholesterol: 66 mg/dL     Total Cholesterol: 207 mg/dL    A/P: Diabetes longstanding currently uncontrolled. Medication adherence appears fair.  -Continued basal insulin Basaglar (insulin glargine) at 35 units daily in the morning.  -Combined rapid insulin Fiasp (insulin aspart) doses of 5 units at bedtime and 10 units with dinner to 15 units with dinner -Added rapid insulin Fiasp (insulin aspart) 15 units with lunch.  -Continued SGLT2-I Jardiance (empagliflozin) 10 mg daily. -Continued metformin 1000 mg BID. Clarified twice daily dosing with  patient. -Patient educated on purpose, proper use, and potential adverse effects of medications.  -Extensively discussed pathophysiology of diabetes, recommended lifestyle interventions, dietary effects on blood sugar control.  -Counseled on s/sx of and management of hypoglycemia.  -Next A1c anticipated July 2024.  -Sent patient home with information about downloading FreeStyle Libre 3 app with plan to initiate CGM in clinic next week.   ASCVD risk - secondary prevention in patient with diabetes. Last LDL is 104 not at goal of <16 mg/dL. ASCVD risk factors include diabetes, hypertension. high intensity statin indicated.  -Continued rosuvastatin 40 mg daily. - Consider addition of ezetimibe vs referral to lipid clinic   Hypertension longstanding currently uncontrolled. Systolic blood pressure goal of <130 mmHg. Medication adherence variable. Blood pressure control is suboptimal due to high salt diet, not taking blood pressure medications today. -Continued amlodipine 10 mg daily, carvedilol 12.5 mg BID, losartan 50 mg daily,. -Clarified twice daily dosing of carvedilol with patient. -Educated patient on role of dietary salt and hypertension, including benefits of controlled blood pressure on kidney and cardiovascular health. - Asked to reduce intake of "pork rinds" to zero if possible.   Written patient instructions provided. Patient verbalized understanding of treatment plan.  Total time in face to face counseling 41 minutes.    Follow-up:  Pharmacist 09/16/22 at 10:30 AM Patient seen with [name] PharmD Candidate and [name], PharmD Candidate.

## 2022-09-06 NOTE — Patient Instructions (Addendum)
It was nice to see you today!  Your goal blood sugar is 80-130 before eating and less than 180 after eating.  For your next visit, bring all of your medications and your glucometer.  Download the smartphone app "Freestyle Libre 3" and we will set up a Continuous Glucose Monitor at your next visit.  Medication Changes: Continue Basaglar (long-acting insulin) 35 units daily in the morning  Begin taking Fiasp (short-acting insulin) 15 units with LUNCH and 15 units with DINNER.  Begin taking carvedilol 12.5 mg TWICE a day. Begin taking metformin 500 mg TWO TABLETS TWICE a day.  Decrease pork rinds and other salty foods to decrease your blood pressure.  Your microalbumin/creatinine ratio was elevated on your lab work. This is related to kidney function. Factors that can improve this include managing blood sugar and blood pressure. Follow-up with your kidney doctor next week.  Monitor blood sugars at home and keep a log (glucometer or piece of paper) to bring with you to your next visit.  Keep up the good work with diet and exercise. Aim for a diet full of vegetables, fruit and lean meats (chicken, [place], fish). Try to limit salt intake by eating fresh or frozen vegetables (instead of canned), rinse canned vegetables prior to cooking and do not add any additional salt to meals.

## 2022-09-07 NOTE — Progress Notes (Signed)
Reviewed and agree with Dr Koval's plan.   

## 2022-09-13 NOTE — Progress Notes (Unsigned)
Cardiology Clinic Note   Patient Name: [name] Date of Encounter: 09/15/2022  Primary Care Provider:  Evelena Leyden, DO Primary Cardiologist:  Chrystie Nose, MD  Patient Profile    [name] 72 year old male presents to the clinic today for follow-up evaluation essential hypertension atypical angina and CHF.  Past Medical History    Past Medical History:  Diagnosis Date   Diabetes mellitus without complication (HCC)    Hypercholesterolemia    Hypertension    MI (myocardial infarction) [facility])    Past Surgical History:  Procedure Laterality Date   CORONARY ANGIOPLASTY WITH STENT PLACEMENT  08/01/2003   Proximal LAD    Allergies  Allergies  Allergen Reactions   Trulicity [Dulaglutide] Other (See Comments)    Patient indicated this caused pancreatitis.     History of Present Illness    [name] is a PMH of HTN, atypical angina, CHF type 2 diabetes, AKI, HLD, and low-level literacy.  He had PCI to his LAD in 2005.  He was admitted to the hospital on 10 1 and discharged on 01/24/2022.  He presented with chest discomfort.  High-sensitivity troponins were flat and he did report history of dyspnea on exertion.  His chest x-ray and CTA were negative.  Cardiology was consulted.  No further testing was recommended.  His lipid panel showed an LDL of 104 compared to previously being 141.  His LDL goal is less than 70.  He was continued on his home aspirin and simvastatin.  His pain improved with lidocaine patch.  His pain was felt to be related to MSK.  His echocardiogram showed a newly reduced EF of 45-50%.  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  He was discharged on carvedilol, amlodipine, Jardiance, rosuvastatin and aspirin.  He was noted to be hypertensive during his hospitalization however no as needed antihypertensive medications were needed.  His home type 2 diabetes with medication was continued.  His A1c was noted to be 8.9.  He presented to the  clinic 10/23 for follow-up evaluation and stated he continued to have left shoulder pain that wraped around through his axillary region.  He noticeed pain increased with movement.  We reviewed his emergency department visit.  He expressed understanding.  I reassured him that his pain was not related to his heart and was related to muscle pain/discomfort.  His blood pressure was slightly elevated initially  at 142/98 and on recheck is 138/80.  He reported that his blood pressure at home was routinely in the 140s systolic.  I ordered physical therapy for his left upper extremity eval and treat, increased his carvedilol to 25 mg twice daily, repeated echocardiogram in 3 months and planned follow-up for 4  months.   He presents to the clinic today for follow-up evaluation and states he has been having urinary urgency.  He denies burning urination and malodorous urine.  He misunderstood his carvedilol instructions and has only been taking 12 and half milligrams twice daily.  His blood pressure initially today is 162/100 and on recheck is 138/82.  His biggest complaint today has to do with urinary symptoms.  I will change his carvedilol prescription, plan echocardiogram in 2 months, and follow-up in 3 to 4 months.  I will order UA for urinary symptoms.  Today he  denies  shortness of breath, lower extremity edema, fatigue, palpitations, melena, hematuria, hemoptysis, diaphoresis, weakness, presyncope, and syncope.     Home Medications    Prior to Admission medications  Medication Sig Start Date End Date Taking? Authorizing Provider  Accu-Chek Softclix Lancets lancets USE AS DIRECTED UP TO FOUR TIMES DAILY 06/10/19   Shirley, [place], DO  amLODipine (NORVASC) 10 MG tablet TAKE 1 TABLET(10 MG) BY MOUTH AT BEDTIME Patient taking differently: Take 10 mg by mouth at bedtime. 04/28/21   Lilland, Alana, DO  aspirin EC 81 MG tablet Take 81 mg by mouth daily.    [provider]  blood glucose meter kit and  supplies Dispense based on patient and insurance preference. Use up to four times daily as directed. (FOR ICD-10 E10.9, E11.9). 05/13/20   Lilland, Alana, DO  carvedilol (COREG) 12.5 MG tablet Take 1 tablet (12.5 mg total) by mouth 2 (two) times daily with a meal. 01/24/22   Vonna Drafts, MD  empagliflozin (JARDIANCE) 10 MG TABS tablet Take 1 tablet (10 mg total) by mouth daily. 08/23/21   Lilland, Alana, DO  FIASP FLEXTOUCH 100 UNIT/ML FlexTouch Pen INJECT 10 UNITS INTO THE SKIN DAILY BEFORE SUPPER 10/15/21   Lilland, Alana, DO  glucose blood (ACCU-CHEK GUIDE) test strip Use as instructed 05/21/20   Moses Manners, MD  Insulin Glargine (BASAGLAR KWIKPEN) 100 UNIT/ML SOPN Inject 0.3 mLs (30 Units total) into the skin daily. Patient taking differently: Inject 10 Units into the skin daily. 11/15/18   Moses Manners, MD  Insulin Pen Needle (BD PEN NEEDLE NANO 2ND GEN) 32G X 4 MM MISC Inject 1 Container into the skin 3 times/day as needed-between meals & bedtime. USE AS DIRECTED DAILY 09/10/21   Lilland, Alana, DO  lidocaine (LIDODERM) 5 % Place 1 patch onto the skin daily. Remove & Discard patch within 12 hours or as directed by MD 01/25/22   Vonna Drafts, MD  metFORMIN (GLUCOPHAGE) 500 MG tablet TAKE 2 TABLETS(1000 MG) BY MOUTH TWICE DAILY WITH A MEAL Patient taking differently: Take 500 mg by mouth at bedtime. 06/10/21   Lilland, Alana, DO  polyethylene glycol powder (GLYCOLAX/MIRALAX) 17 GM/SCOOP powder Take 1 capful (17 g) with water by mouth daily. 01/25/22   Vonna Drafts, MD  rosuvastatin (CRESTOR) 40 MG tablet TAKE 1 TABLET(40 MG) BY MOUTH DAILY Patient taking differently: Take 40 mg by mouth daily. 07/16/21   Evelena Leyden, DO    Family History    Family History  Problem Relation Age of Onset   Heart disease Mother    Diabetes Mother    Hypertension Mother    Alcoholism Father    Heart disease Father    Hypertension Sister    Diabetes Brother    Hypercholesterolemia Brother     Osteoporosis Brother    Hypertension Brother    Kidney disease Brother    He indicated that the status of his mother is unknown. He indicated that the status of his father is unknown. He indicated that the status of his sister is unknown. He indicated that the status of his brother is unknown.  Social History    Social History   Socioeconomic History   Marital status: Married    Spouse name: Elnita Maxwell   Number of children: 4   Years of education: 10   Highest education level: 10th grade  Occupational History   Occupation: retired  Tobacco Use   Smoking status: Former    Packs/day: 0.50    Years: 10.00    Additional pack years: 0.00    Total pack years: 5.00    Types: Cigarettes    Quit date: 04/25/1993    Years since quitting:  29.4   Smokeless tobacco: Never  Vaping Use   Vaping Use: Never used  Substance and Sexual Activity   Alcohol use: No   Drug use: No   Sexual activity: Yes  Other Topics Concern   Not on file  Social History Narrative   Patient lives with his wife in [place].   Patent has 4 boys, one lives in [place].    Patient enjoys fishing, camping in the woods, working outside, anything outdoor related.   Social Determinants of Health   Financial Resource Strain: Low Risk  (08/29/2022)   Overall Financial Resource Strain (CARDIA)    Difficulty of Paying Living Expenses: Not hard at all  Food Insecurity: No Food Insecurity (08/29/2022)   Hunger Vital Sign    Worried About Running Out of Food in the Last Year: Never true    Ran Out of Food in the Last Year: Never true  Transportation Needs: No Transportation Needs (08/29/2022)   PRAPARE - Administrator, Civil Service (Medical): No    Lack of Transportation (Non-Medical): No  Physical Activity: Insufficiently Active (08/29/2022)   Exercise Vital Sign    Days of Exercise per Week: 3 days    Minutes of Exercise per Session: 30 min  Stress: No Stress Concern Present (08/29/2022)   [employer] of  Occupational Health - Occupational Stress Questionnaire    Feeling of Stress : Not at all  Social Connections: Moderately Integrated (08/29/2022)   Social Connection and Isolation Panel [NHANES]    Frequency of Communication with Friends and Family: More than three times a week    Frequency of Social Gatherings with Friends and Family: More than three times a week    Attends Religious Services: 1 to 4 times per year    Active Member of [employer] or Organizations: No    Attends Banker Meetings: Never    Marital Status: Married  Catering manager Violence: Not At Risk (08/29/2022)   Humiliation, Afraid, Rape, and Kick questionnaire    Fear of Current or Ex-Partner: No    Emotionally Abused: No    Physically Abused: No    Sexually Abused: No     Review of Systems    General:  No chills, fever, night sweats or weight changes.  Cardiovascular:  No chest pain, dyspnea on exertion, edema, orthopnea, palpitations, paroxysmal nocturnal dyspnea. Dermatological: No rash, lesions/masses Respiratory: No cough, dyspnea Urologic: No hematuria, dysuria Abdominal:   No nausea, vomiting, diarrhea, bright red blood per rectum, melena, or hematemesis Neurologic:  No visual changes, wkns, changes in mental status. All other systems reviewed and are otherwise negative except as noted above.  Physical Exam    VS:  BP 138/82   Pulse 94   Ht 5\' 4"  (1.626 m)   Wt 201 lb 6.4 oz (91.4 kg)   SpO2 96%   BMI 34.57 kg/m  , BMI Body mass index is 34.57 kg/m. GEN: Well nourished, well developed, in no acute distress. HEENT: normal. Neck: Supple, no JVD, carotid bruits, or masses. Cardiac: RRR, no murmurs, rubs, or gallops. No clubbing, cyanosis, edema.  Radials/DP/PT 2+ and equal bilaterally.  Respiratory:  Respirations regular and unlabored, clear to auscultation bilaterally. GI: Soft, nontender, nondistended, BS + x 4. MS: no deformity or atrophy.  Skin: warm and dry, no rash. Neuro:  Strength  and sensation are intact. Psych: Normal affect.  Accessory Clinical Findings    Recent Labs: 01/22/2022: ALT 20; B Natriuretic Peptide 91.7; Hemoglobin 14.2; Platelets  294 01/23/2022: TSH 2.436 01/24/2022: Magnesium 2.2 08/31/2022: BUN 24; Creatinine, Ser 1.44; Potassium 4.1; Sodium 139   Recent Lipid Panel    Component Value Date/Time   CHOL 207 (H) 01/23/2022 0430   CHOL 243 (H) 08/23/2021 1458   TRIG 186 (H) 01/23/2022 0430   HDL 66 01/23/2022 0430   HDL 77 08/23/2021 1458   CHOLHDL 3.1 01/23/2022 0430   VLDL 37 01/23/2022 0430   LDLCALC 104 (H) 01/23/2022 0430   LDLCALC 141 (H) 08/23/2021 1458   LDLDIRECT 56 09/10/2020 0947         ECG personally reviewed by me today-none today.  Echocardiogram 01/23/2022  IMPRESSIONS     1. Left ventricular ejection fraction, by estimation, is 45 to 50%. Left  ventricular ejection fraction by PLAX is 46 %. The left ventricle has  mildly decreased function. The left ventricle demonstrates global  hypokinesis. There is mild left ventricular  hypertrophy. Left ventricular diastolic parameters are consistent with  Grade I diastolic dysfunction (impaired relaxation). Elevated left  ventricular end-diastolic pressure. The E/e' is 16.   2. Right ventricular systolic function is mildly reduced. The right  ventricular size is normal.   3. The mitral valve is abnormal. Trivial mitral valve regurgitation.   4. The aortic valve is tricuspid. Aortic valve regurgitation is not  visualized. Aortic valve sclerosis is present, with no evidence of aortic  valve stenosis.   5. The inferior vena cava is normal in size with greater than 50%  respiratory variability, suggesting right atrial pressure of 3 mmHg.   Comparison(s): No prior Echocardiogram.  Assessment & Plan   1.  Cardiomyopathy-has returned to normal daily activities.  Cardiomyopathy felt to be related to hypertension.  Misunderstood carvedilol prescription.  He was only taking 12 and  half milligrams twice daily Increase carvedilol to 25 mg twice daily Continue Jardiance, amlodipine, rosuvastatin, aspirin Currently low-sodium high-fiber diet Increase physical activity as tolerated Repeat echocardiogram in 1-2 months  Atypical chest pain-denies recent chest pain.    Underwent cardiac catheterization with PCI to his proximal LAD in 2005.  Recent admission showed low stable flat high-sensitivity troponins and was felt to be MSK in nature. No plans for ischemic evaluation  Coronary artery disease-denies exertional chest pain.  Has resumed normal daily activities.  Previous PCI in 2005 to his proximal LAD. Continue current medical regimen  Hyperlipidemia-01/23/2022: Cholesterol 207; HDL 66; LDL Cholesterol 104; Triglycerides 186; VLDL 37.  Continue aspirin, rosuvastatin Continue ezetimibe Heart healthy low-sodium high-fiber diet.   Increase physical activity as tolerated Repeat fasting lipids and LFTs  Essential hypertension-BP today 138/82 Continue amlodipine,  Increase carvedilol 25 mg BID Maintain blood pressure log  Urinary urgency-reports that for the past several weeks he has had increased urgency.  He denies burning sensation and malodorous urine.  Has follow-up appointment with PCP tomorrow.  I will defer antibiotic prescription to PCP. Order UA    Disposition: Follow-up with Dr. Rennis Golden or me in 3-4 months.   Thomasene Ripple. Tiegan Terpstra NP-C     09/15/2022, 2:37 PM [facility] [address] Office [phone] Fax [phone]  Notice: This dictation was prepared with Dragon dictation along with smaller phrase technology. Any transcriptional errors that result from this process are unintentional and may not be corrected upon review.  I spent 14 minutes examining this patient, reviewing medications, and using patient centered shared decision making involving her cardiac care.  Prior to her visit I spent greater than 20 minutes  reviewing her past medical history,  medications, and prior cardiac tests.

## 2022-09-15 ENCOUNTER — Encounter: Payer: Self-pay | Admitting: General Practice

## 2022-09-15 ENCOUNTER — Ambulatory Visit: Payer: Medicare HMO | Attending: General Practice | Admitting: General Practice

## 2022-09-15 VITALS — BP 138/82 | HR 94 | Ht 64.0 in | Wt 201.4 lb

## 2022-09-15 DIAGNOSIS — N390 Urinary tract infection, site not specified: Secondary | ICD-10-CM

## 2022-09-15 DIAGNOSIS — I429 Cardiomyopathy, unspecified: Secondary | ICD-10-CM | POA: Diagnosis not present

## 2022-09-15 DIAGNOSIS — I25118 Atherosclerotic heart disease of native coronary artery with other forms of angina pectoris: Secondary | ICD-10-CM | POA: Diagnosis not present

## 2022-09-15 DIAGNOSIS — E782 Mixed hyperlipidemia: Secondary | ICD-10-CM

## 2022-09-15 DIAGNOSIS — I2089 Other forms of angina pectoris: Secondary | ICD-10-CM

## 2022-09-15 DIAGNOSIS — I2510 Atherosclerotic heart disease of native coronary artery without angina pectoris: Secondary | ICD-10-CM

## 2022-09-15 DIAGNOSIS — I10 Essential (primary) hypertension: Secondary | ICD-10-CM

## 2022-09-15 MED ORDER — CARVEDILOL 25 MG PO TABS: 25.0000 mg | ORAL_TABLET | Freq: Two times a day (BID) | ORAL | 3 refills | Status: DC

## 2022-09-15 NOTE — Patient Instructions (Addendum)
Medication Instructions:  Carvedilol 25 mg twice daily  *If you need a refill on your cardiac medications before your next appointment, please call your pharmacy*   Lab Work: UA today.  Testing/Procedures: Your physician has requested that you have an echocardiogram 2 months. Echocardiography is a painless test that uses sound waves to create images of your heart. It provides your doctor with information about the size and shape of your heart and how well your heart's chambers and valves are working. This procedure takes approximately one hour. There are no restrictions for this procedure. Please do NOT wear cologne, perfume, aftershave, or lotions (deodorant is allowed). Please arrive 15 minutes prior to your appointment time.    Follow-Up: At [facility], you and your health needs are our priority.  As part of our continuing mission to provide you with exceptional heart care, we have created designated Provider Care Teams.  These Care Teams include your primary Cardiologist (physician) and Advanced Practice Providers (APPs -  Physician Assistants and Nurse Practitioners) who all work together to provide you with the care you need, when you need it.  We recommend signing up for the patient portal called "MyChart".  Sign up information is provided on this After Visit Summary.  MyChart is used to connect with patients for Virtual Visits (Telemedicine).  Patients are able to view lab/test results, encounter notes, upcoming appointments, etc.  Non-urgent messages can be sent to your provider as well.   To learn more about what you can do with MyChart, go to [email or web address].    Your next appointment:   3-4 month(s)  Provider:   Chrystie Nose, MD  or Edd Fabian, FNP        Other Instructions  Wear briefs as discussed.   Kegel Exercises  Kegel exercises can help strengthen your pelvic floor muscles. The pelvic floor is a group of muscles that support your rectum,  small intestine, and bladder. In females, pelvic floor muscles also help support the uterus. These muscles help you control the flow of urine and stool (feces). Kegel exercises are painless and simple. They do not require any equipment. Your provider may suggest Kegel exercises to: Improve bladder and bowel control. Improve sexual response. Improve weak pelvic floor muscles after surgery to remove the uterus (hysterectomy) or after pregnancy, in females. Improve weak pelvic floor muscles after prostate gland removal or surgery, in males. Kegel exercises involve squeezing your pelvic floor muscles. These are the same muscles you squeeze when you try to stop the flow of urine or keep from passing gas. The exercises can be done while sitting, standing, or lying down, but it is best to vary your position. Ask your health care provider which exercises are safe for you. Do exercises exactly as told by your health care provider and adjust them as directed. Do not begin these exercises until told by your health care provider. Exercises How to do Kegel exercises: Squeeze your pelvic floor muscles tight. You should feel a tight lift in your rectal area. If you are a male, you should also feel a tightness in your vaginal area. Keep your stomach, buttocks, and legs relaxed. Hold the muscles tight for up to 10 seconds. Breathe normally. Relax your muscles for up to 10 seconds. Repeat as told by your health care provider. Repeat this exercise daily as told by your health care provider. Continue to do this exercise for at least 4-6 weeks, or for as long as told by your health care  provider. You may be referred to a physical therapist who can help you learn more about how to do Kegel exercises. Depending on your condition, your health care provider may recommend: Varying how long you squeeze your muscles. Doing several sets of exercises every day. Doing exercises for several weeks. Making Kegel exercises a  part of your regular exercise routine. This information is not intended to replace advice given to you by your health care provider. Make sure you discuss any questions you have with your health care provider. Document Revised: 08/20/2020 Document Reviewed: 08/20/2020 Elsevier Patient Education  2024 [employer].

## 2022-09-16 ENCOUNTER — Encounter: Payer: Self-pay | Admitting: Family Medicine

## 2022-09-16 ENCOUNTER — Ambulatory Visit (INDEPENDENT_AMBULATORY_CARE_PROVIDER_SITE_OTHER): Payer: Medicare HMO | Admitting: Pharmacist

## 2022-09-16 ENCOUNTER — Encounter: Payer: Self-pay | Admitting: Pharmacist

## 2022-09-16 VITALS — BP 163/88 | HR 91 | Ht 65.0 in | Wt 201.0 lb

## 2022-09-16 DIAGNOSIS — I10 Essential (primary) hypertension: Secondary | ICD-10-CM | POA: Diagnosis not present

## 2022-09-16 DIAGNOSIS — E119 Type 2 diabetes mellitus without complications: Secondary | ICD-10-CM | POA: Diagnosis not present

## 2022-09-16 DIAGNOSIS — Z794 Long term (current) use of insulin: Secondary | ICD-10-CM

## 2022-09-16 LAB — URINALYSIS, ROUTINE W REFLEX MICROSCOPIC
Bilirubin, UA: NEGATIVE
Leukocytes,UA: NEGATIVE
Nitrite, UA: NEGATIVE
Specific Gravity, UA: >=1.03 — AB (ref 1.005–1.030)
Urobilinogen, Ur: 0.2 mg/dL (ref 0.2–1.0)
pH, UA: 5.5 (ref 5.0–7.5)

## 2022-09-16 LAB — MICROSCOPIC EXAMINATION
Bacteria, UA: NONE SEEN
Casts: NONE SEEN /lpf
Epithelial Cells (non renal): NONE SEEN /hpf (ref 0–10)
WBC, UA: NONE SEEN /hpf (ref 0–5)

## 2022-09-16 MED ORDER — LOSARTAN POTASSIUM 100 MG PO TABS: 100.0000 mg | ORAL_TABLET | Freq: Every day | ORAL | 3 refills | Status: DC

## 2022-09-16 MED ORDER — AMLODIPINE BESYLATE 10 MG PO TABS: 10.0000 mg | ORAL_TABLET | Freq: Every day | ORAL | 11 refills | Status: DC

## 2022-09-16 MED ORDER — FIASP FLEXTOUCH 100 UNIT/ML ~~LOC~~ SOPN: 15.0000 [IU] | PEN_INJECTOR | Freq: Two times a day (BID) | SUBCUTANEOUS | 1 refills | Status: DC

## 2022-09-16 MED ORDER — METFORMIN HCL 500 MG PO TABS: 1000.0000 mg | ORAL_TABLET | Freq: Two times a day (BID) | ORAL | 11 refills | Status: DC

## 2022-09-16 NOTE — Patient Instructions (Addendum)
It was nice to see you today!  Your goal blood sugar is 80-130 before eating and less than 180 after eating.  Your goal blood PRESSURE is <130/80.   Medication Changes: Continue all other medications.  Begin taking 15 units of Fiasp (insulin aspart) at lunch supper.  Continue taking 35 units of Basaglar (insulin glarine) daily.  Begin taking losartan 100 mg (2 of the 50 mg tablets) daily.  Set up Continuous Glucose Monitor (Libre 3) app. Share data with the clinic using the instructions below.  Monitor blood sugars at home and keep a log (glucometer or piece of paper) to bring with you to your next visit.  Keep up the good work with diet and exercise. Aim for a diet full of vegetables, fruit and lean meats (chicken, [place], fish). Try to limit salt intake by eating fresh or frozen vegetables (instead of canned), rinse canned vegetables prior to cooking and do not add any additional salt to meals.   Sensor Application If using the App, you can tap Help in the Main Menu to access an in-app tutorial on applying a Sensor. See below for instructions on how to download the app. Apply Sensors only on the back of your upper arm. If placed in other areas, the Sensor may not function properly and could give you inaccurate readings. Avoid areas with scars, moles, stretch marks, or lumps.   Select an area of skin that generally stays flat during your normal daily activities (no bending or folding). Choose a site that is at least 1 inch (2.5 cm) away from any injection sites. To prevent discomfort or skin irritation, you should select a different site other than the one most recently used. Wash application site using a plain soap, dry, and then clean with an alcohol wipe. This will help remove any oily residue that may prevent the sensor from sticking properly. Allow site to air dry before proceeding. Note: The area MUST be clean and dry, or the Sensor may not stay on for the full wear duration  specified by your Sensor insert. 4. Unscrew the cap from the Sensor Applicator and set the cap aside.  5. Place the Sensor Applicator over the prepared site and push down firmly to apply the Sensor to your body. 6. Gently pull the Sensor Applicator away from your body. The Sensor should now be attached to your skin. 7. Make sure the Sensor is secure after application. Put the cap back on the Sensor Applicator. Discard the used Engineer, agricultural according to local regulations.  What If My Sensor Falls Off or What If My Sensor Isn't Working? Call [employer] Customer Care Team at [phone] Available 7 days a week from 8AM-8PM EST, excluding holidays If yo have multiple sensors fall off prior to 14 days of use, contact [facility] at [phone]   The App Download the FreeStyle [place] 3 App in your phone's app store   Load the app and select get started now Create an account  Tap scan new sensor Follow the prompts on the screen. If your sensor does not sync, try moving your phone slowly around the sensor. Phone cases may affect scanning. This will be the only time you have to scan the sensor until you apply a new sensor in 14 days.  There will be a 60 minute start up period until the app will display your glucose reading   How To Share Your Readings With [place] Once in the app, go to settings -> connected  apps -> LibreView -> Enter Practice ID -> [id number]

## 2022-09-16 NOTE — Progress Notes (Signed)
S:     Chief Complaint  Patient presents with   Medication Management    DM/hTN follow up   72 y.o. male who presents for diabetes evaluation, education, and management.  PMH is significant for hypertension, diabetes, .  Patient was referred and last seen by Primary Care Provider, Dr. Clayborne Artist, on 08/31/2022. Patient was last seen by pharmacy on 09/06/2022. At last visit, increased insulin aspart to 15 units with lunch and supper - however patient has not increased dose yet.  Sent home with instructions to download [place] 3 app with wife.  Today, patient arrives in good spirits and presents without any assistance. Patient brought all medications for review.  Home BP readings: (wrist cuff) This morning: SBP: 142 In-office: 158/102, 152/97  Current diabetes medications include: Fiasp insulin aspart - taking 10 units at lunch, 5 units at dinner. Basaglar insulin glargine 35 units daily. Jardiance (empagliflozin) 10 mg daily. Metformin 1000 mg BID.   Current hypertension medications include: lorsartan 50 mg daily, amlodipine 10 mg daily, carvedilol 25 mg twice daily. Current hyperlipidemia medications include: rosuvastatin 40 mg daily.  Patient reports adherence to taking all medications as prescribed, aside from mealtime insulin aspart.   Do you feel that your medications are working for you? yes Have you been experiencing any side effects to the medications prescribed? no Do you have any problems obtaining medications due to transportation or finances? no Insurance coverage: [employer]  Patient denies hypoglycemic events.  Patient reports nocturia (nighttime urination).  Patient denies neuropathy (nerve pain). Patient denies visual changes. Patient denies self foot exams.   Patient reported dietary habits: Eats 2 meals/day Cut out pork rinds to reduce salt intake!  Within the past 12 months, did you worry whether your food would run out before you got money to buy more? no Within  the past 12 months, did the food you bought run out, and you didn't have money to get more? no  Patient-reported exercise habits: Works in the yard most days.   O:   Review of Systems  Genitourinary:  Positive for frequency.  All other systems reviewed and are negative.   Physical Exam Constitutional:      Appearance: Normal appearance.  Pulmonary:     Effort: Pulmonary effort is normal.  Neurological:     Mental Status: He is alert.  Psychiatric:        Mood and Affect: Mood normal.        Behavior: Behavior normal.        Thought Content: Thought content normal.        Judgment: Judgment normal.     Lab Results  Component Value Date   HGBA1C 13.1 (A) 08/31/2022   Vitals:   09/16/22 1032 09/16/22 1034  BP: (!) 160/89 (!) 163/88  Pulse: 91   SpO2: 98%     Lipid Panel     Component Value Date/Time   CHOL 207 (H) 01/23/2022 0430   CHOL 243 (H) 08/23/2021 1458   TRIG 186 (H) 01/23/2022 0430   HDL 66 01/23/2022 0430   HDL 77 08/23/2021 1458   CHOLHDL 3.1 01/23/2022 0430   VLDL 37 01/23/2022 0430   LDLCALC 104 (H) 01/23/2022 0430   LDLCALC 141 (H) 08/23/2021 1458   LDLDIRECT 56 09/10/2020 0947    Clinical Atherosclerotic Cardiovascular Disease (ASCVD): No  The 10-year ASCVD risk score (Arnett DK, et al., 2019) is: 66.7%   Values used to calculate the score:     Age: 34  years     Sex: Male     Is Non-Hispanic African American: Yes     Diabetic: Yes     Tobacco smoker: Yes     Systolic Blood Pressure: 163 mmHg     Is BP treated: Yes     HDL Cholesterol: 66 mg/dL     Total Cholesterol: 207 mg/dL    A/P: Diabetes longstanding currently uncontrolled with A1c 13.1%. Patient is able to verbalize appropriate hypoglycemia management plan. Medication adherence appears fair. Control is suboptimal due to adherence to medication adjustments. -Continued basal insulin Basaglar (insulin glargine) at 35 units daily in the morning.   -Continued rapid insulin Fiasp  (insulin aspart) at 15 units with lunch and supper. Clarified dosing instructions since patient had not implemented previous plan. Patient verbalized understanding of new plan.  -[employer] 3 sensor during visit today. Sent patient home with instructions to set up account, share data with clinic. -Continued SGLT2-I Jardiance (empagliflozin) 10 mg.  -Continued metformin 1000 mg BID. Clarified dosing instructions.  -Patient educated on purpose, proper use, and potential adverse effects of medications.  -Extensively discussed pathophysiology of diabetes, recommended lifestyle interventions, dietary effects on blood sugar control.  -Counseled on s/sx of and management of hypoglycemia.  -Next A1c anticipated August 2024.   ASCVD risk - secondary prevention in patient with diabetes. Last LDL is 104 not at goal of <40 mg/dL. ASCVD risk factors include diabetes, hypertension. high intensity statin indicated.  -Continued rosuvastatin 40 mg daily. -Consider addition of ezetimibe vs referral to lipid clinic   Hypertension longstanding currently uncontrolled. Blood pressure goal of <130/80 mmHg. Medication adherence improved. Blood pressure control is suboptimal due to requiring additional medication therapy. -Increased dose of losartan from 50 mg to 100 mg daily. Instructed to take two of current supply 50mg  tablets until gone then start new prescription.   Written patient instructions provided. Patient verbalized understanding of treatment plan.  Total time in face to face counseling 24 minutes.    Follow-up:  Pharmacist 10/11/2022. Patient seen with Haze Boyden PharmD Candidate.

## 2022-09-16 NOTE — Assessment & Plan Note (Signed)
Diabetes longstanding currently uncontrolled with A1c 13.1%. Patient is able to verbalize appropriate hypoglycemia management plan. Medication adherence appears fair. Control is suboptimal due to adherence to medication adjustments. -Continued basal insulin Basaglar (insulin glargine) at 35 units daily in the morning.   -Continued rapid insulin Fiasp (insulin aspart) at 15 units with lunch and supper. Clarified dosing instructions since patient had not implemented previous plan. Patient verbalized understanding of new plan.  -[employer] 3 sensor during visit today. Sent patient home with instructions to set up account, share data with clinic. -Continued SGLT2-I Jardiance (empagliflozin) 10 mg.  -Continued metformin 1000 mg BID. Clarified dosing instructions.  -Patient educated on purpose, proper use, and potential adverse effects of medications.  -Extensively discussed pathophysiology of diabetes, recommended lifestyle interventions, dietary effects on blood sugar control.  -Counseled on s/sx of and management of hypoglycemia.

## 2022-09-16 NOTE — Assessment & Plan Note (Signed)
Hypertension longstanding currently uncontrolled. Blood pressure goal of <130/80 mmHg. Medication adherence improved. Blood pressure control is suboptimal due to requiring additional medication therapy. -Increased dose of losartan from 50 mg to 100 mg daily. Instructed to take two of current supply 50mg  tablets until gone then start new prescription.

## 2022-09-19 NOTE — Progress Notes (Signed)
Reviewed and agree with Dr Koval's plan.   

## 2022-09-22 DIAGNOSIS — E785 Hyperlipidemia, unspecified: Secondary | ICD-10-CM | POA: Diagnosis not present

## 2022-09-22 DIAGNOSIS — N1831 Chronic kidney disease, stage 3a: Secondary | ICD-10-CM | POA: Diagnosis not present

## 2022-09-22 DIAGNOSIS — I129 Hypertensive chronic kidney disease with stage 1 through stage 4 chronic kidney disease, or unspecified chronic kidney disease: Secondary | ICD-10-CM | POA: Diagnosis not present

## 2022-09-22 DIAGNOSIS — E1122 Type 2 diabetes mellitus with diabetic chronic kidney disease: Secondary | ICD-10-CM | POA: Diagnosis not present

## 2022-09-23 LAB — LAB REPORT - SCANNED
Albumin, Urine POC: 2107.7
Creatinine, POC: 60.1 mg/dL
Microalb Creat Ratio: 3507

## 2022-09-27 ENCOUNTER — Encounter: Payer: Self-pay | Admitting: Family Medicine

## 2022-09-29 ENCOUNTER — Telehealth: Payer: Self-pay | Admitting: Pharmacist

## 2022-09-29 MED ORDER — FREESTYLE LIBRE 3 SENSOR MISC: 2.0000 | 11 refills | Status: DC | PRN

## 2022-09-29 NOTE — Telephone Encounter (Signed)
Attempted to call to create a plan for CGM sensor replacement.   Asked to return call with Voice Mail  Provided direct line [phone]

## 2022-09-29 NOTE — Telephone Encounter (Signed)
Patient in need of sensors.   Contacted patient and requested call back.    Patient arrived and was seen for sensor placement assistance.   Patient correctly placed sensor for use - starting tomorrow - on his right arm.   His plan is to use the sensor on his left arm until tomorrow when it expires. THEN he will use the menu on his Libre 3 APP - to start new sensor.  He will swipe his phone on the new sensor and once connection establishes, he was reminded he will see a vitamin C warning and then need to wait 60 minutes until his glucose readings begin to display.   He verbalized understanding of treatment plan.  No changes to his medication regimen.   Discussed need to observe /reflect on carbohydrate intake during periods when glucose readings are elevated.  We discussed potential to adjust insulin at those meals in the future.  Plan to discuss more at next visit.   Additional sensor provided for future use.   New prescription for [employer] sent to his pharmacy.

## 2022-09-29 NOTE — Addendum Note (Signed)
Addended by: Kathrin Ruddy on: 09/29/2022 05:55 PM   Modules accepted: Orders

## 2022-09-30 NOTE — Telephone Encounter (Signed)
Reviewed and agree with Dr Koval's plan.   

## 2022-10-11 ENCOUNTER — Ambulatory Visit (INDEPENDENT_AMBULATORY_CARE_PROVIDER_SITE_OTHER): Payer: Medicare HMO | Admitting: Pharmacist

## 2022-10-11 VITALS — BP 171/93 | HR 97 | Ht 65.5 in | Wt 208.6 lb

## 2022-10-11 DIAGNOSIS — E782 Mixed hyperlipidemia: Secondary | ICD-10-CM | POA: Diagnosis not present

## 2022-10-11 DIAGNOSIS — E119 Type 2 diabetes mellitus without complications: Secondary | ICD-10-CM

## 2022-10-11 DIAGNOSIS — I10 Essential (primary) hypertension: Secondary | ICD-10-CM

## 2022-10-11 MED ORDER — FIASP FLEXTOUCH 100 UNIT/ML ~~LOC~~ SOPN: PEN_INJECTOR | SUBCUTANEOUS | 3 refills | Status: DC

## 2022-10-11 MED ORDER — VALSARTAN-HYDROCHLOROTHIAZIDE 320-12.5 MG PO TABS: 1.0000 | ORAL_TABLET | Freq: Every day | ORAL | 3 refills | Status: DC

## 2022-10-11 NOTE — Patient Instructions (Signed)
It was nice to see you today!  Your goal blood sugar is 80-130 before eating and less than 180 after eating.  Medication Changes: Continue Basaglar 35 units daily in the morning, Jardiance 10 mg daily, and metformin 1000 mg (2 tablets) twice daily  Adjust Fiasp to 15 units in the morning and 10 units in the evening  Discontinue losartan  Start valsartan/hydrochlorothiazide 320/12.5 mg daily  Continue amlodipine 10 mg daily and carvedilol 25 mg twice daily   Monitor blood sugars at home and keep a log (glucometer or piece of paper) to bring with you to your next visit.  Keep up the good work with diet and exercise. Aim for a diet full of vegetables, fruit and lean meats (chicken, [place], fish). Try to limit salt intake by eating fresh or frozen vegetables (instead of canned), rinse canned vegetables prior to cooking and do not add any additional salt to meals.

## 2022-10-11 NOTE — Progress Notes (Signed)
S:     Chief Complaint  Patient presents with   Medication Management    Diabetes and Hypertension   72 y.o. male who presents for diabetes evaluation, education, and management.  PMH is significant for hypertension, diabetes, .  Patient was referred and last seen by Primary Care Provider, Dr. Clayborne Artist, on 08/31/2022. Patient was last seen by pharmacy on 09/06/2022. At last visit, increased insulin aspart to 15 units with lunch and supper - however patient has not increased dose yet.  Sent home with instructions to download [place] 3 app with wife.  Today, patient arrives in good spirits and presents without any assistance. Patient brought all medications for review.  Current diabetes medications include: Fiasp insulin aspart (taking 15 units at breakfast, 5 units at dinner) Basaglar insulin glargine 35 units daily. Jardiance (empagliflozin) 10 mg daily. Metformin 1000 mg BID.   Current hypertension medications include: losartan 100 mg daily, amlodipine 10 mg daily, carvedilol 25 mg twice daily. Current hyperlipidemia medications include: rosuvastatin 40 mg daily.  He reports medication adherence, however he had some difficulty confirming this. He forgets to take his Basaglar insulin 1-2 times a week. He reports adherence to his short-acting insulin.   Insurance coverage: [employer]  Reported home BP readings: 140-150 SBP  Patient denies hypoglycemic events.  Patient reports nocturia (nighttime urination).  Patient denies neuropathy (nerve pain). Patient reports visual changes. Patient reports self foot exams.   Patient reported dietary habits: Eats 2 meals/day - Stopped eating pork rinds - Eating vegetables and baked chicken daily   Patient-reported exercise habits: Works in the yard most days.   O:   Review of Systems  Genitourinary:  Positive for frequency.  All other systems reviewed and are negative.   Physical Exam Vitals reviewed.  Pulmonary:     Effort:  Pulmonary effort is normal.     Breath sounds: Normal breath sounds.  Neurological:     Mental Status: He is alert.  Psychiatric:        Mood and Affect: Mood normal.        Behavior: Behavior normal.        Thought Content: Thought content normal.        Judgment: Judgment normal.    10/11/22 CGM Download:  % Time CGM is active: 97% Average Glucose: 204 mg/dL Glucose Management Indicator: 8.2%  Glucose Variability: 25% (goal <36%) Time in Goal:  - Time in range 70-180: 31% - Time above range: 69% - Time below range: 0% Observed patterns:   Lab Results  Component Value Date   HGBA1C 13.1 (A) 08/31/2022   Vitals:   10/11/22 1433 10/11/22 1436  BP: (!) 158/101 (!) 171/93  Pulse: 97   SpO2: 100%     Lipid Panel     Component Value Date/Time   CHOL 207 (H) 01/23/2022 0430   CHOL 243 (H) 08/23/2021 1458   TRIG 186 (H) 01/23/2022 0430   HDL 66 01/23/2022 0430   HDL 77 08/23/2021 1458   CHOLHDL 3.1 01/23/2022 0430   VLDL 37 01/23/2022 0430   LDLCALC 104 (H) 01/23/2022 0430   LDLCALC 141 (H) 08/23/2021 1458   LDLDIRECT 56 09/10/2020 0947    Clinical Atherosclerotic Cardiovascular Disease (ASCVD): Yes  The 10-year ASCVD risk score (Arnett DK, et al., 2019) is: 70%   Values used to calculate the score:     Age: 69 years     Sex: Male     Is Non-Hispanic African American: Yes  Diabetic: Yes     Tobacco smoker: Yes     Systolic Blood Pressure: 171 mmHg     Is BP treated: Yes     HDL Cholesterol: 66 mg/dL     Total Cholesterol: 207 mg/dL    A/P: Diabetes longstanding currently uncontrolled but based on current LibreView report. Patient is able to verbalize appropriate hypoglycemia management plan. Medication adherence appears suboptimal as he is unable to confirm medication regimen. Control is suboptimal due to possible medication nonadherence and dietary indiscretions. -Continued basal insulin Basaglar (insulin glargine) 35 units daily -Adjusted dose of rapid  insulin rapid Fiasp to 15 units before breakfast and 10 units before supper -Continued SGLT2-I Jardiance (empagliflozin) 10 mg. Counseled on sick day rules. -Continued metformin 1000 mg BID.  -Extensively discussed pathophysiology of diabetes, recommended lifestyle interventions, dietary effects on blood sugar control.  -Counseled on s/sx of and management of hypoglycemia.  -Next A1c anticipated 11/2022.   ASCVD risk - secondary prevention in patient with diabetes. Last LDL is 104 not at goal of <16 mg/dL. ASCVD risk factors include diabetes, hypertension. high intensity statin indicated.  -Continued rosuvastatin 40 mg daily.  Hypertension longstanding currently uncontrolled. Blood pressure goal of <130/80 mmHg. Medication adherence inconsistent. Blood pressure control is suboptimal due to medication nonadherence. -Continued amlodipine 10 mg daily and carvedilol 25 mg BID - Discontinued losartan 100 mg daily - Initiated valsartan/hydrochlorothiazide 320/12.5 mg daily  BMET obtained - Lab results - electrolytes stable, Renal function slightly improved.   Written patient instructions provided. Patient verbalized understanding of treatment plan.  Total time in face to face counseling 27 minutes.    Follow-up:  Pharmacist on 11/17/22 Patient seen with Lily Peer, PharmD, PGY-1 resident.

## 2022-10-11 NOTE — Assessment & Plan Note (Signed)
Hypertension longstanding currently uncontrolled. Blood pressure goal of <130/80 mmHg. Medication adherence inconsistent. Blood pressure control is suboptimal due to medication nonadherence. -Continued amlodipine 10 mg daily and carvedilol 25 mg BID - Discontinued losartan 100 mg daily - Initiated valsartan/hydrochlorothiazide 320/12.5 mg daily

## 2022-10-11 NOTE — Assessment & Plan Note (Signed)
Diabetes longstanding currently uncontrolled but based on current LibreView report. Patient is able to verbalize appropriate hypoglycemia management plan. Medication adherence appears suboptimal as he is unable to confirm medication regimen. Control is suboptimal due to possible medication nonadherence and dietary indiscretions. -Continued basal insulin Basaglar (insulin glargine) 35 units daily -Adjusted dose of rapid insulin rapid Fiasp to 15 units before breakfast and 10 units before supper -Continued SGLT2-I Jardiance (empagliflozin) 10 mg. Counseled on sick day rules. -Continued metformin 1000 mg BID.  -Extensively discussed pathophysiology of diabetes, recommended lifestyle interventions, dietary effects on blood sugar control.  -Counseled on s/sx of and management of hypoglycemia.

## 2022-10-11 NOTE — Assessment & Plan Note (Signed)
ASCVD risk - secondary prevention in patient with diabetes. Last LDL is 104 not at goal of <16 mg/dL. ASCVD risk factors include diabetes, hypertension. high intensity statin indicated.  -Continued rosuvastatin 40 mg daily.

## 2022-10-12 LAB — BASIC METABOLIC PANEL
BUN/Creatinine Ratio: 14 (ref 10–24)
BUN: 18 mg/dL (ref 8–27)
CO2: 25 mmol/L (ref 20–29)
Calcium: 9.6 mg/dL (ref 8.6–10.2)
Chloride: 103 mmol/L (ref 96–106)
Creatinine, Ser: 1.3 mg/dL — ABNORMAL HIGH (ref 0.76–1.27)
Glucose: 193 mg/dL — ABNORMAL HIGH (ref 70–99)
Potassium: 4.5 mmol/L (ref 3.5–5.2)
Sodium: 142 mmol/L (ref 134–144)
eGFR: 58 mL/min/{1.73_m2} — ABNORMAL LOW (ref >59)

## 2022-10-12 NOTE — Progress Notes (Signed)
Reviewed and agree with Dr Koval's plan.   

## 2022-10-13 ENCOUNTER — Encounter: Payer: Self-pay | Admitting: Family Medicine

## 2022-11-15 DIAGNOSIS — E113312 Type 2 diabetes mellitus with moderate nonproliferative diabetic retinopathy with macular edema, left eye: Secondary | ICD-10-CM | POA: Diagnosis not present

## 2022-11-15 DIAGNOSIS — H25813 Combined forms of age-related cataract, bilateral: Secondary | ICD-10-CM | POA: Diagnosis not present

## 2022-11-15 DIAGNOSIS — E113291 Type 2 diabetes mellitus with mild nonproliferative diabetic retinopathy without macular edema, right eye: Secondary | ICD-10-CM | POA: Diagnosis not present

## 2022-11-17 ENCOUNTER — Ambulatory Visit: Payer: Medicare HMO | Admitting: Pharmacist

## 2022-11-19 ENCOUNTER — Emergency Department (HOSPITAL_COMMUNITY): Payer: Medicare HMO

## 2022-11-19 ENCOUNTER — Encounter (HOSPITAL_COMMUNITY): Payer: Self-pay | Admitting: Emergency Medicine

## 2022-11-19 ENCOUNTER — Other Ambulatory Visit: Payer: Self-pay

## 2022-11-19 ENCOUNTER — Emergency Department (HOSPITAL_COMMUNITY)
Admission: EM | Admit: 2022-11-19 | Discharge: 2022-11-19 | Disposition: A | Discharge status: Home / Self Care | Payer: Medicare HMO | Attending: Emergency Medicine | Admitting: Emergency Medicine

## 2022-11-19 DIAGNOSIS — Z7984 Long term (current) use of oral hypoglycemic drugs: Secondary | ICD-10-CM | POA: Diagnosis not present

## 2022-11-19 DIAGNOSIS — Z79899 Other long term (current) drug therapy: Secondary | ICD-10-CM | POA: Diagnosis not present

## 2022-11-19 DIAGNOSIS — R030 Elevated blood-pressure reading, without diagnosis of hypertension: Secondary | ICD-10-CM

## 2022-11-19 DIAGNOSIS — I10 Essential (primary) hypertension: Secondary | ICD-10-CM | POA: Diagnosis not present

## 2022-11-19 DIAGNOSIS — Z794 Long term (current) use of insulin: Secondary | ICD-10-CM | POA: Insufficient documentation

## 2022-11-19 DIAGNOSIS — I2510 Atherosclerotic heart disease of native coronary artery without angina pectoris: Secondary | ICD-10-CM | POA: Diagnosis not present

## 2022-11-19 DIAGNOSIS — M25561 Pain in right knee: Secondary | ICD-10-CM | POA: Diagnosis not present

## 2022-11-19 DIAGNOSIS — E119 Type 2 diabetes mellitus without complications: Secondary | ICD-10-CM | POA: Insufficient documentation

## 2022-11-19 DIAGNOSIS — W19XXXA Unspecified fall, initial encounter: Secondary | ICD-10-CM | POA: Insufficient documentation

## 2022-11-19 MED ORDER — ACETAMINOPHEN 500 MG PO TABS: 1000.0000 mg | ORAL_TABLET | Freq: Four times a day (QID) | ORAL | 0 refills | Status: AC | PRN

## 2022-11-19 MED ORDER — ACETAMINOPHEN 500 MG PO TABS
1000.0000 mg | ORAL_TABLET | Freq: Once | ORAL | Status: AC
  Administered 2022-11-19: 1000 mg via ORAL
  Filled 2022-11-19: qty 2

## 2022-11-19 MED ORDER — OXYCODONE HCL 5 MG PO TABS: 5.0000 mg | ORAL_TABLET | Freq: Four times a day (QID) | ORAL | 0 refills | Status: AC | PRN

## 2022-11-19 MED ORDER — OXYCODONE HCL 5 MG PO TABS
5.0000 mg | ORAL_TABLET | Freq: Once | ORAL | Status: AC
  Administered 2022-11-19: 5 mg via ORAL
  Filled 2022-11-19: qty 1

## 2022-11-19 NOTE — ED Provider Notes (Signed)
[place] EMERGENCY DEPARTMENT AT [facility] Provider Note   CSN: [id number] Arrival date & time: 11/19/22  4098     History  Chief Complaint  Patient presents with   Knee Pain    [name] is a 72 y.o. male with past medical history hypertension, hyperlipidemia, CAD, diabetes who presents to the ED complaining of right knee pain after accidentally falling on it 4 days ago due to a mechanical fall.  He states that pain is uncontrolled at home using Tylenol.  He is able to ambulate but does have increased difficulty doing so compared to his baseline.  Typically he ambulates unassisted without difficulty.  No history of prior knee injuries.  No chest pain, shortness of breath, fever, numbness or tingling, or other acute symptoms.      Home Medications Prior to Admission medications   Medication Sig Start Date End Date Taking? Authorizing Provider  acetaminophen (TYLENOL) 500 MG tablet Take 2 tablets (1,000 mg total) by mouth every 6 (six) hours as needed for up to 3 days for moderate pain. 11/19/22 11/22/22 Yes ,  L, PA-C  oxyCODONE (ROXICODONE) 5 MG immediate release tablet Take 1 tablet (5 mg total) by mouth every 6 (six) hours as needed for up to 3 days for severe pain or breakthrough pain. 11/19/22 11/22/22 Yes ,  L, PA-C  Accu-Chek Softclix Lancets lancets USE AS DIRECTED UP TO FOUR TIMES DAILY Patient not taking: Reported on 10/11/2022 06/10/19   Shirley, [place], DO  amLODipine (NORVASC) 10 MG tablet Take 1 tablet (10 mg total) by mouth daily for 15 days. 09/16/22   McDiarmid, Leighton Roach, MD  aspirin EC 81 MG tablet Take 81 mg by mouth daily.    [provider]  blood glucose meter kit and supplies Dispense based on patient and insurance preference. Use up to four times daily as directed. (FOR ICD-10 E10.9, E11.9). Patient not taking: Reported on 10/11/2022 05/13/20   Lilland, Alana, DO  carvedilol (COREG) 25 MG tablet Take 1 tablet (25 mg total) by  mouth 2 (two) times daily. 09/15/22 12/14/22  Ronney Asters, NP  Continuous Glucose Sensor (FREESTYLE LIBRE 3 SENSOR) MISC 2 Devices by Does not apply route as needed. Place 1 sensor on the skin every 14 days. Use to check glucose continuously 09/29/22   McDiarmid, Leighton Roach, MD  empagliflozin (JARDIANCE) 10 MG TABS tablet Take 1 tablet (10 mg total) by mouth daily. 02/02/22   Lilland, Alana, DO  glucose blood (ACCU-CHEK GUIDE) test strip Use as instructed Patient not taking: Reported on 10/11/2022 05/21/20   Moses Manners, MD  insulin aspart (FIASP FLEXTOUCH) 100 UNIT/ML FlexTouch Pen Inject 15 units into the skin before breakfast and 10 units before supper 10/11/22   McDiarmid, Leighton Roach, MD  Insulin Glargine (BASAGLAR KWIKPEN) 100 UNIT/ML Inject 35 Units into the skin daily. 08/31/22   Lilland, Alana, DO  Insulin Pen Needle (BD PEN NEEDLE NANO 2ND GEN) 32G X 4 MM MISC Inject 1 Container into the skin 3 times/day as needed-between meals & bedtime. USE AS DIRECTED DAILY 09/10/21   Lilland, Alana, DO  metFORMIN (GLUCOPHAGE) 500 MG tablet Take 2 tablets (1,000 mg total) by mouth 2 (two) times daily with a meal. 09/16/22   McDiarmid, Leighton Roach, MD  rosuvastatin (CRESTOR) 40 MG tablet TAKE 1 TABLET(40 MG) BY MOUTH DAILY Patient taking differently: Take 40 mg by mouth daily. 07/16/21   Lilland, Alana, DO  valsartan-hydrochlorothiazide (DIOVAN-HCT) 320-12.5 MG tablet Take 1 tablet  by mouth daily. 10/11/22   McDiarmid, Leighton Roach, MD      Allergies    Trulicity [dulaglutide]    Review of Systems   Review of Systems  All other systems reviewed and are negative.   Physical Exam Updated Vital Signs BP (!) 196/104 (BP Location: Right Arm)   Pulse 100   Temp 98.6 F (37 C) (Oral)   Resp 20   Ht 5\' 5"  (1.651 m)   Wt 108.9 kg   SpO2 100%   BMI 39.94 kg/m  Physical Exam Vitals and nursing note reviewed.  Constitutional:      General: He is not in acute distress.    Appearance: Normal appearance.  HENT:      Head: Normocephalic and atraumatic.     Mouth/Throat:     Mouth: Mucous membranes are moist.  Eyes:     Conjunctiva/sclera: Conjunctivae normal.  Cardiovascular:     Rate and Rhythm: Normal rate and regular rhythm.     Heart sounds: No murmur heard. Pulmonary:     Effort: Pulmonary effort is normal.     Breath sounds: Normal breath sounds.  Abdominal:     General: Abdomen is flat.     Palpations: Abdomen is soft.  Musculoskeletal:     Cervical back: Neck supple.     Comments: Tenderness to the mid patella on the right, joint is stable, no effusion or swelling, 2+ DP and PT pulses, normal sensation, knee flexion slightly limited secondary to pain but otherwise range of motion grossly intact, soft compartments, no calf tenderness, no overlying erythema, increased warmth, or wounds  Skin:    General: Skin is warm and dry.     Capillary Refill: Capillary refill takes less than 2 seconds.  Neurological:     Mental Status: He is alert. Mental status is at baseline.  Psychiatric:        Behavior: Behavior normal.     ED Results / Procedures / Treatments   Labs (all labs ordered are listed, but only abnormal results are displayed) Labs Reviewed - No data to display  EKG None  Radiology DG Knee Complete 4 Views Right  Result Date: 11/19/2022 CLINICAL DATA:  Right knee pain 4 days. Pain started after bending down. Pain with walking. EXAM: RIGHT KNEE - COMPLETE 4+ VIEW COMPARISON:  None Available. FINDINGS: No evidence of fracture, dislocation, or joint effusion. No evidence of arthropathy or other focal bone abnormality. Soft tissues are unremarkable. IMPRESSION: Negative. Electronically Signed   By: Marin Roberts M.D.   On: 11/19/2022 10:28    Procedures Procedures    Medications Ordered in ED Medications  oxyCODONE (Oxy IR/ROXICODONE) immediate release tablet 5 mg (has no administration in time range)  acetaminophen (TYLENOL) tablet 1,000 mg (has no administration in  time range)    ED Course/ Medical Decision Making/ A&P                             Medical Decision Making Amount and/or Complexity of Data Reviewed Radiology: ordered. Decision-making details documented in ED Course.  Risk OTC drugs. Prescription drug management.   Medical Decision Making:   [name] is a 72 y.o. male who presented to the ED today with knee pain detailed above.    Patient's presentation is complicated by their history of advanced age.  Complete initial physical exam performed, notably the patient was in no acute distress with stable knee joint.  Tenderness over  the mid patella.    Reviewed and confirmed nursing documentation for past medical history, family history, social history.    Initial Assessment:   With the patient's presentation, differential diagnosis includes but is not limited to sprain, strain, fracture, dislocation, compartment syndrome, septic joint, DVT, arthritic.  This is most consistent with an acute complicated illness  Initial Plan:  XR to assess for bony pathology Objective evaluation as below reviewed   Initial Study Results:   Radiology:  All images reviewed independently. Agree with radiology report at this time.   DG Knee Complete 4 Views Right  Result Date: 11/19/2022 CLINICAL DATA:  Right knee pain 4 days. Pain started after bending down. Pain with walking. EXAM: RIGHT KNEE - COMPLETE 4+ VIEW COMPARISON:  None Available. FINDINGS: No evidence of fracture, dislocation, or joint effusion. No evidence of arthropathy or other focal bone abnormality. Soft tissues are unremarkable. IMPRESSION: Negative. Electronically Signed   By: Marin Roberts M.D.   On: 11/19/2022 10:28     Final Assessment and Plan:   72 year old male presents to the ED with right knee pain post mechanical fall.  Knee joint is stable.  No overlying skin changes.  No calf tenderness.  No tenderness over the tib-fib, ankle, or foot.  He is able to ambulate but  does have increased pain with doing so.  Difficulty controlling pain at home with Tylenol.  X-ray without acute changes.  Does have a history of CKD as well.  As he is able to ambulate and otherwise appears well, do believe that he is stable for outpatient follow-up.  Given uncontrolled pain with Tylenol and the need to avoid NSAIDs secondary to CKD, will give a small amount of oxycodone as needed at home for breakthrough, severe pain.  Patient to use this as second line if Tylenol is not working.  PDMP reviewed and negative.  Provided with orthopedics follow-up for continued symptoms.  Strict ED return precautions given, all questions answered, and stable for discharge.   Clinical Impression:  1. Acute pain of right knee   2. Elevated blood pressure reading      Discharge           Final Clinical Impression(s) / ED Diagnoses Final diagnoses:  Acute pain of right knee  Elevated blood pressure reading    Rx / DC Orders ED Discharge Orders          Ordered    acetaminophen (TYLENOL) 500 MG tablet  Every 6 hours PRN        11/19/22 1054    oxyCODONE (ROXICODONE) 5 MG immediate release tablet  Every 6 hours PRN        11/19/22 1054              Tonette Lederer, PA-C 11/19/22 1100    Arby Barrette, MD 12/05/22 1744

## 2022-11-19 NOTE — Discharge Instructions (Addendum)
Thank you for letting us take care of you today.  We did not see any significant injuries on your knee x-ray today. Please care for it at home as discussed. You  may take 1000mg  Tylenol as needed every 6 hours for pain. Do not take more than 4000mg  in 24 hours. For continued, severe pain despite the Tylenol I prescribed a small amount of oxycodone to take as needed. If this makes you lightheaded or dizzy, you should stop this medication but otherwise may take it as needed for severe pain.  Your blood pressure today is also elevated. This may be due to pain or you may  need adjustments to your medications to  better control this. Take your blood pressure once daily and keep a log. Follow up with your PCP to discuss any needed changes to your medications if it remains high.   If you continue to have symptoms, follow up with orthopedics as we discussed. I provided our on-call orthopedic or you may go to one of your own choosing. For new or worsening symptoms, return to nearest ED for re-evaluation.

## 2022-11-19 NOTE — ED Triage Notes (Signed)
Pt reports 4 days of right knee pain since bending down and falling on knee. Pt reports painful to walk. Pt taking tylenol for the pain with little relief.

## 2022-11-19 NOTE — ED Notes (Signed)
Pt has 1+ swelling of right knee. Pt has 2+ right pedal pulse, cap refill less than 3 sec, warm to touch.

## 2022-11-22 ENCOUNTER — Ambulatory Visit: Payer: Medicare HMO | Admitting: Pharmacist

## 2022-11-23 ENCOUNTER — Telehealth: Payer: Self-pay

## 2022-11-23 NOTE — Telephone Encounter (Signed)
Patient wife reached out regarding patients Basaglar shipment (which delivers to office from [employer]).  Informed wife that patient had a previous order from 08/2022 that had never been picked up, along with a new order from today. She's aware and will get to the office to pick both orders up. (4 boxes today).

## 2022-11-24 ENCOUNTER — Telehealth: Payer: Self-pay

## 2022-11-24 NOTE — Telephone Encounter (Signed)
Transition Care Management Unsuccessful Follow-up Telephone Call  Date of discharge and from where:  11/19/2022 [facility]  Attempts:  1st Attempt  Reason for unsuccessful TCM follow-up call:  Left voice message  Makylee Sanborn [facility]  [facility] Population Health Community Resource Care Guide   ??[email or web address]  ?? [phone]   Website: [email or web address]  [email or web address]

## 2022-11-25 ENCOUNTER — Telehealth: Payer: Self-pay

## 2022-11-25 NOTE — Telephone Encounter (Signed)
Transition Care Management Unsuccessful Follow-up Telephone Call  Date of discharge and from where:  11/19/2022 [facility]  Attempts:  2nd Attempt  Reason for unsuccessful TCM follow-up call:  Left voice message   [facility]  [facility] Population Health Community Resource Care Guide   ??[email or web address]  ?? [phone]   Website: [email or web address]  [email or web address]

## 2022-12-02 ENCOUNTER — Telehealth: Payer: Self-pay | Admitting: Pharmacist

## 2022-12-02 NOTE — Telephone Encounter (Signed)
Attempted to contact patient for follow-up of insulin supply and glucose control.    Left HIPAA compliant voice mail requesting call back to direct phone: [phone] to make appointment to see me.   Total time with patient call and documentation of interaction: 4 minutes.

## 2022-12-05 NOTE — Telephone Encounter (Signed)
Wife returns call to nurse line.   Patient scheduled for tomorrow with [facility].

## 2022-12-06 ENCOUNTER — Ambulatory Visit (INDEPENDENT_AMBULATORY_CARE_PROVIDER_SITE_OTHER): Payer: Medicare HMO | Admitting: Pharmacist

## 2022-12-06 VITALS — BP 171/87 | HR 89 | Wt 200.4 lb

## 2022-12-06 DIAGNOSIS — Z794 Long term (current) use of insulin: Secondary | ICD-10-CM | POA: Diagnosis not present

## 2022-12-06 DIAGNOSIS — I10 Essential (primary) hypertension: Secondary | ICD-10-CM

## 2022-12-06 DIAGNOSIS — E119 Type 2 diabetes mellitus without complications: Secondary | ICD-10-CM | POA: Diagnosis not present

## 2022-12-06 MED ORDER — BASAGLAR KWIKPEN 100 UNIT/ML ~~LOC~~ SOPN: 40.0000 [IU] | PEN_INJECTOR | Freq: Every day | SUBCUTANEOUS | Status: DC

## 2022-12-06 MED ORDER — VALSARTAN-HYDROCHLOROTHIAZIDE 320-12.5 MG PO TABS: 1.0000 | ORAL_TABLET | Freq: Every day | ORAL | 3 refills | Status: DC

## 2022-12-06 MED ORDER — FIASP FLEXTOUCH 100 UNIT/ML ~~LOC~~ SOPN: 20.0000 [IU] | PEN_INJECTOR | Freq: Two times a day (BID) | SUBCUTANEOUS | 3 refills | Status: DC

## 2022-12-06 MED ORDER — CARVEDILOL 25 MG PO TABS: 25.0000 mg | ORAL_TABLET | Freq: Two times a day (BID) | ORAL | 3 refills | Status: DC

## 2022-12-06 NOTE — Assessment & Plan Note (Signed)
Diabetes longstanding and currently with poor control despite having access to basal bolus insulin dosing.  Patient denies any increase in stress or any symptoms of infection over the last several weeks. Patient is able to verbalize appropriate hypoglycemia management plan. Medication adherence appears problematic.  After asking patient to demonstrate dosing of insulin pens it was clear that patient was unable to communicat clearly the prescribed doses or correct timing.  -Increased dose of basal insulin Basaglar (insulin glargine) from 35 units daily to 40 units daily at 6:30 AM.  -Increased dose of rapid insulin Fiasp (insulin aspart) from 15 to 20 units prior to two large meals daily.   -Continued SGLT2-I  Jardiance (empagliflozin) 10 mg daily. Counseled on sick day rules. -Continued metformin 1000mg  BID.  -Patient educated on new insulin doses and repeated multiple times.  Following several rounds of teach-back patient was able to clearly recall and show on the demo pens the correct doses of basaglar (insulin glargine) and Fiasp (insulin aspart).

## 2022-12-06 NOTE — Progress Notes (Signed)
S:     Chief Complaint  Patient presents with   Medication Management    Diabetes   72 y.o. male who presents for diabetes evaluation, education, and management.  PMH is significant for hypertension, diabetes.  Patient was referred and last seen by Primary Care Provider, Dr. Clayborne Artist, on 08/31/2022  Last seen in pharmacist clinic 6/18   Patient arrived, happy to report he had recently celebrated his 50th wedding anniversary.  He also stated he did not think his [employer] was working - due to the glucose line being much higher than the target range.    Reported use of current diabetes medications include: Fiasp insulin aspart (taking 15 units at breakfast, 15 units at dinner) Basaglar insulin glargine 35 units daily. Jardiance (empagliflozin) 10 mg daily. Metformin 1000 mg BID.    Current hypertension medications include: amlodipine 10 mg daily, carvedilol 25 mg twice daily and valsartan/hydrochlorothiazide daily (however this has not been recently filled) Current hyperlipidemia medications include: rosuvastatin 40 mg daily (not recently filled). Patient admits non-adherence with medications, reports missing multiple dose of various medications each weeks.    Patient denies hypoglycemic events.  Patient reported dietary habits: Eats 2 meals/day  12:30 and 6:30 PM Drinks: Water  Patient-reported exercise habits: Variety of activities including mowing grass.   O:   Review of Systems  All other systems reviewed and are negative.   Physical Exam Pulmonary:     Effort: Pulmonary effort is normal.  Neurological:     Mental Status: He is alert.  Psychiatric:        Mood and Affect: Mood normal.   Struggled to clearly following dosing instruction for insulin.  Following multiple attempts, patient was able to demonstrate correct doses of both types of insulin with demo pens.   Libre3 CGM Download today 12/06/2022  % Time CGM is active: 47% Average Glucose: 357  mg/dL Glucose Management Indicator: 11.8  Glucose Variability: 11.9% (goal <36%) Time in Goal:  - Time in range 70-180: 0% - Time above range: 100% - Time below range: 0% Observed patterns:   Lab Results  Component Value Date   HGBA1C 13.1 (A) 08/31/2022   Vitals:   12/06/22 1444 12/06/22 1445  BP: (!) 176/92 (!) 171/87  Pulse: 89   SpO2: 100%     Lipid Panel     Component Value Date/Time   CHOL 207 (H) 01/23/2022 0430   CHOL 243 (H) 08/23/2021 1458   TRIG 186 (H) 01/23/2022 0430   HDL 66 01/23/2022 0430   HDL 77 08/23/2021 1458   CHOLHDL 3.1 01/23/2022 0430   VLDL 37 01/23/2022 0430   LDLCALC 104 (H) 01/23/2022 0430   LDLCALC 141 (H) 08/23/2021 1458   LDLDIRECT 56 09/10/2020 0947    Clinical Atherosclerotic Cardiovascular Disease (ASCVD): Yes  The 10-year ASCVD risk score (Arnett DK, et al., 2019) is: 50.1%   Values used to calculate the score:     Age: 78 years     Sex: Male     Is Non-Hispanic African American: Yes     Diabetic: Yes     Tobacco smoker: No     Systolic Blood Pressure: 171 mmHg     Is BP treated: Yes     HDL Cholesterol: 66 mg/dL     Total Cholesterol: 207 mg/dL   A/P: Diabetes longstanding and currently with poor control despite having access to basal bolus insulin dosing.  Patient denies any increase in stress or any symptoms of  infection over the last several weeks. Patient is able to verbalize appropriate hypoglycemia management plan. Medication adherence appears problematic.  After asking patient to demonstrate dosing of insulin pens it was clear that patient was unable to communicat clearly the prescribed doses or correct timing.  -Increased dose of basal insulin Basaglar (insulin glargine) from 35 units daily to 40 units daily at 6:30 AM.  -Increased dose of rapid insulin Fiasp (insulin aspart) from 15 to 20 units prior to two large meals daily.   -Continued SGLT2-I  Jardiance (empagliflozin) 10 mg daily. Counseled on sick day  rules. -Continued metformin 1000mg  BID.  -Patient educated on new insulin doses and repeated multiple times.  Following several rounds of teach-back patient was able to clearly recall and show on the demo pens the correct doses of basaglar (insulin glargine) and Fiasp (insulin aspart).   Hypertension longstanding and currently uncontrolled due to non-adherence with Valsartan/HCTZ. Blood pressure goal of <130/80 mmHg. Blood pressure control is suboptimal due to complex regimen, resistant hypertension and lack of attention to managing entire regimen. -Reordered Valsartan/hydrochlorothiazide AND continued all other blood pressure medications.   Written patient instructions provided. Patient verbalized understanding of treatment plan.  Total time in face to face counseling 51 minutes.    Follow-up:  Pharmacist 2 weeks PCP clinic visit in 1-2 months Patient seen with Andee Poles, PharmD Candidate.

## 2022-12-06 NOTE — Patient Instructions (Signed)
It was nice to see you today!  Your blood sugar is much higher than excpected on your [place] 3 meter.    Bring all medicines to your next visit.     Your goal blood sugar is 80-130 before eating and less than 180 after eating.  Medication Changes:  Increase FIASP - mealtime insulin to 20 units prior to meals twice daily  Increase Basaglar - to 40 units once daily  Take your Carvedilol twice daily  Take your valsartan/hydrochlorothiazide once daily  Continue all other medication the same.  Monitor blood sugars at home and keep a log (glucometer or piece of paper) to bring with you to your next visit.  Keep up the good work with diet and exercise. Aim for a diet full of vegetables, fruit and lean meats (chicken, [place], fish). Try to limit salt intake by eating fresh or frozen vegetables (instead of canned), rinse canned vegetables prior to cooking and do not add any additional salt to meals.

## 2022-12-06 NOTE — Assessment & Plan Note (Signed)
Hypertension longstanding and currently uncontrolled due to non-adherence with Valsartan/HCTZ. Blood pressure goal of <130/80 mmHg. Blood pressure control is suboptimal due to complex regimen, resistant hypertension and lack of attention to managing entire regimen. -Reordered Valsartan/hydrochlorothiazide AND continued all other blood pressure medications.

## 2022-12-07 NOTE — Progress Notes (Signed)
Reviewed and agree with Dr Koval's plan.   

## 2022-12-08 ENCOUNTER — Encounter (HOSPITAL_COMMUNITY): Payer: Self-pay | Admitting: General Practice

## 2022-12-08 ENCOUNTER — Ambulatory Visit (HOSPITAL_COMMUNITY): Payer: Medicare HMO | Attending: General Practice

## 2022-12-14 NOTE — Progress Notes (Deleted)
Cardiology Clinic Note   Patient Name: [name] Date of Encounter: 12/14/2022  Primary Care Provider:  Glendale Chard, DO Primary Cardiologist:  Chrystie Nose, MD  Patient Profile    [name] 72 year old male presents to the clinic today for follow-up evaluation essential hypertension atypical angina and CHF.  Past Medical History    Past Medical History:  Diagnosis Date   Diabetes mellitus without complication (HCC)    Hypercholesterolemia    Hypertension    MI (myocardial infarction) [facility])    Past Surgical History:  Procedure Laterality Date   CORONARY ANGIOPLASTY WITH STENT PLACEMENT  08/01/2003   Proximal LAD    Allergies  Allergies  Allergen Reactions   Trulicity [Dulaglutide] Other (See Comments)    Patient indicated this caused pancreatitis.     History of Present Illness    [name] is a PMH of HTN, atypical angina, CHF type 2 diabetes, AKI, HLD, and low-level literacy.  He had PCI to his LAD in 2005.  He was admitted to the hospital on 10 1 and discharged on 01/24/2022.  He presented with chest discomfort.  High-sensitivity troponins were flat and he did report history of dyspnea on exertion.  His chest x-ray and CTA were negative.  Cardiology was consulted.  No further testing was recommended.  His lipid panel showed an LDL of 104 compared to previously being 141.  His LDL goal is less than 70.  He was continued on his home aspirin and simvastatin.  His pain improved with lidocaine patch.  His pain was felt to be related to MSK.  His echocardiogram showed a newly reduced EF of 45-50%.  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  He was discharged on carvedilol, amlodipine, Jardiance, rosuvastatin and aspirin.  He was noted to be hypertensive during his hospitalization however no as needed antihypertensive medications were needed.  His home type 2 diabetes with medication was continued.  His A1c was noted to be 8.9.  He presented to the  clinic 10/23 for follow-up evaluation and stated he continued to have left shoulder pain that wraped around through his axillary region.  He noticeed pain increased with movement.  We reviewed his emergency department visit.  He expressed understanding.  I reassured him that his pain was not related to his heart and was related to muscle pain/discomfort.  His blood pressure was slightly elevated initially  at 142/98 and on recheck is 138/80.  He reported that his blood pressure at home was routinely in the 140s systolic.  I ordered physical therapy for his left upper extremity eval and treat, increased his carvedilol to 25 mg twice daily, repeated echocardiogram in 3 months and planned follow-up for 4  months.   He presents to the clinic today for follow-up evaluation and states he has been having urinary urgency.  He denies burning urination and malodorous urine.  He misunderstood his carvedilol instructions and has only been taking 12 and half milligrams twice daily.  His blood pressure initially today is 162/100 and on recheck is 138/82.  His biggest complaint today has to do with urinary symptoms.  I will change his carvedilol prescription, plan echocardiogram in 2 months, and follow-up in 3 to 4 months.  I will order UA for urinary symptoms.  Today he  denies  shortness of breath, lower extremity edema, fatigue, palpitations, melena, hematuria, hemoptysis, diaphoresis, weakness, presyncope, and syncope.     Home Medications    Prior to Admission medications  Medication Sig Start Date End Date Taking? Authorizing Provider  Accu-Chek Softclix Lancets lancets USE AS DIRECTED UP TO FOUR TIMES DAILY 06/10/19   Shirley, [place], DO  amLODipine (NORVASC) 10 MG tablet TAKE 1 TABLET(10 MG) BY MOUTH AT BEDTIME Patient taking differently: Take 10 mg by mouth at bedtime. 04/28/21   Lilland, Alana, DO  aspirin EC 81 MG tablet Take 81 mg by mouth daily.    [provider]  blood glucose meter kit and  supplies Dispense based on patient and insurance preference. Use up to four times daily as directed. (FOR ICD-10 E10.9, E11.9). 05/13/20   Lilland, Alana, DO  carvedilol (COREG) 12.5 MG tablet Take 1 tablet (12.5 mg total) by mouth 2 (two) times daily with a meal. 01/24/22   Vonna Drafts, MD  empagliflozin (JARDIANCE) 10 MG TABS tablet Take 1 tablet (10 mg total) by mouth daily. 08/23/21   Lilland, Alana, DO  FIASP FLEXTOUCH 100 UNIT/ML FlexTouch Pen INJECT 10 UNITS INTO THE SKIN DAILY BEFORE SUPPER 10/15/21   Lilland, Alana, DO  glucose blood (ACCU-CHEK GUIDE) test strip Use as instructed 05/21/20   Moses Manners, MD  Insulin Glargine (BASAGLAR KWIKPEN) 100 UNIT/ML SOPN Inject 0.3 mLs (30 Units total) into the skin daily. Patient taking differently: Inject 10 Units into the skin daily. 11/15/18   Moses Manners, MD  Insulin Pen Needle (BD PEN NEEDLE NANO 2ND GEN) 32G X 4 MM MISC Inject 1 Container into the skin 3 times/day as needed-between meals & bedtime. USE AS DIRECTED DAILY 09/10/21   Lilland, Alana, DO  lidocaine (LIDODERM) 5 % Place 1 patch onto the skin daily. Remove & Discard patch within 12 hours or as directed by MD 01/25/22   Vonna Drafts, MD  metFORMIN (GLUCOPHAGE) 500 MG tablet TAKE 2 TABLETS(1000 MG) BY MOUTH TWICE DAILY WITH A MEAL Patient taking differently: Take 500 mg by mouth at bedtime. 06/10/21   Lilland, Alana, DO  polyethylene glycol powder (GLYCOLAX/MIRALAX) 17 GM/SCOOP powder Take 1 capful (17 g) with water by mouth daily. 01/25/22   Vonna Drafts, MD  rosuvastatin (CRESTOR) 40 MG tablet TAKE 1 TABLET(40 MG) BY MOUTH DAILY Patient taking differently: Take 40 mg by mouth daily. 07/16/21   Evelena Leyden, DO    Family History    Family History  Problem Relation Age of Onset   Heart disease Mother    Diabetes Mother    Hypertension Mother    Alcoholism Father    Heart disease Father    Hypertension Sister    Diabetes Brother    Hypercholesterolemia Brother     Osteoporosis Brother    Hypertension Brother    Kidney disease Brother    He indicated that the status of his mother is unknown. He indicated that the status of his father is unknown. He indicated that the status of his sister is unknown. He indicated that the status of his brother is unknown.  Social History    Social History   Socioeconomic History   Marital status: Married    Spouse name: Elnita Maxwell   Number of children: 4   Years of education: 10   Highest education level: 10th grade  Occupational History   Occupation: retired  Tobacco Use   Smoking status: Former    Current packs/day: 0.00    Average packs/day: 0.5 packs/day for 10.0 years (5.0 ttl pk-yrs)    Types: Cigarettes    Start date: 04/26/1983    Quit date: 04/25/1993    Years since  quitting: 29.6   Smokeless tobacco: Never  Vaping Use   Vaping status: Never Used  Substance and Sexual Activity   Alcohol use: No   Drug use: No   Sexual activity: Yes  Other Topics Concern   Not on file  Social History Narrative   Patient lives with his wife in [place].   Patent has 4 boys, one lives in [place].    Patient enjoys fishing, camping in the woods, working outside, anything outdoor related.   Social Determinants of Health   Financial Resource Strain: Low Risk  (08/29/2022)   Overall Financial Resource Strain (CARDIA)    Difficulty of Paying Living Expenses: Not hard at all  Food Insecurity: No Food Insecurity (08/29/2022)   Hunger Vital Sign    Worried About Running Out of Food in the Last Year: Never true    Ran Out of Food in the Last Year: Never true  Transportation Needs: No Transportation Needs (08/29/2022)   PRAPARE - Administrator, Civil Service (Medical): No    Lack of Transportation (Non-Medical): No  Physical Activity: Insufficiently Active (08/29/2022)   Exercise Vital Sign    Days of Exercise per Week: 3 days    Minutes of Exercise per Session: 30 min  Stress: No Stress Concern Present  (08/29/2022)   [employer] of Occupational Health - Occupational Stress Questionnaire    Feeling of Stress : Not at all  Social Connections: Moderately Integrated (08/29/2022)   Social Connection and Isolation Panel [NHANES]    Frequency of Communication with Friends and Family: More than three times a week    Frequency of Social Gatherings with Friends and Family: More than three times a week    Attends Religious Services: 1 to 4 times per year    Active Member of [employer] or Organizations: No    Attends Banker Meetings: Never    Marital Status: Married  Catering manager Violence: Not At Risk (08/29/2022)   Humiliation, Afraid, Rape, and Kick questionnaire    Fear of Current or Ex-Partner: No    Emotionally Abused: No    Physically Abused: No    Sexually Abused: No     Review of Systems    General:  No chills, fever, night sweats or weight changes.  Cardiovascular:  No chest pain, dyspnea on exertion, edema, orthopnea, palpitations, paroxysmal nocturnal dyspnea. Dermatological: No rash, lesions/masses Respiratory: No cough, dyspnea Urologic: No hematuria, dysuria Abdominal:   No nausea, vomiting, diarrhea, bright red blood per rectum, melena, or hematemesis Neurologic:  No visual changes, wkns, changes in mental status. All other systems reviewed and are otherwise negative except as noted above.  Physical Exam    VS:  There were no vitals taken for this visit. , BMI There is no height or weight on file to calculate BMI. GEN: Well nourished, well developed, in no acute distress. HEENT: normal. Neck: Supple, no JVD, carotid bruits, or masses. Cardiac: RRR, no murmurs, rubs, or gallops. No clubbing, cyanosis, edema.  Radials/DP/PT 2+ and equal bilaterally.  Respiratory:  Respirations regular and unlabored, clear to auscultation bilaterally. GI: Soft, nontender, nondistended, BS + x 4. MS: no deformity or atrophy.  Skin: warm and dry, no rash. Neuro:  Strength and  sensation are intact. Psych: Normal affect.  Accessory Clinical Findings    Recent Labs: 01/22/2022: ALT 20; B Natriuretic Peptide 91.7; Hemoglobin 14.2; Platelets 294 01/23/2022: TSH 2.436 01/24/2022: Magnesium 2.2 10/11/2022: BUN 18; Creatinine, Ser 1.30; Potassium 4.5; Sodium 142  Recent Lipid Panel    Component Value Date/Time   CHOL 207 (H) 01/23/2022 0430   CHOL 243 (H) 08/23/2021 1458   TRIG 186 (H) 01/23/2022 0430   HDL 66 01/23/2022 0430   HDL 77 08/23/2021 1458   CHOLHDL 3.1 01/23/2022 0430   VLDL 37 01/23/2022 0430   LDLCALC 104 (H) 01/23/2022 0430   LDLCALC 141 (H) 08/23/2021 1458   LDLDIRECT 56 09/10/2020 0947    No BP recorded.  {Refresh Note OR Click here to enter BP  :1}***    ECG personally reviewed by me today-none today.  Echocardiogram 01/23/2022  IMPRESSIONS     1. Left ventricular ejection fraction, by estimation, is 45 to 50%. Left  ventricular ejection fraction by PLAX is 46 %. The left ventricle has  mildly decreased function. The left ventricle demonstrates global  hypokinesis. There is mild left ventricular  hypertrophy. Left ventricular diastolic parameters are consistent with  Grade I diastolic dysfunction (impaired relaxation). Elevated left  ventricular end-diastolic pressure. The E/e' is 16.   2. Right ventricular systolic function is mildly reduced. The right  ventricular size is normal.   3. The mitral valve is abnormal. Trivial mitral valve regurgitation.   4. The aortic valve is tricuspid. Aortic valve regurgitation is not  visualized. Aortic valve sclerosis is present, with no evidence of aortic  valve stenosis.   5. The inferior vena cava is normal in size with greater than 50%  respiratory variability, suggesting right atrial pressure of 3 mmHg.   Comparison(s): No prior Echocardiogram.  Assessment & Plan   1.  Cardiomyopathy-has returned to normal daily activities.  Cardiomyopathy felt to be related to hypertension.   Misunderstood carvedilol prescription.  He was only taking 12 and half milligrams twice daily Increase carvedilol to 25 mg twice daily Continue Jardiance, amlodipine, rosuvastatin, aspirin Currently low-sodium high-fiber diet Increase physical activity as tolerated Repeat echocardiogram in 1-2 months  Atypical chest pain-denies recent chest pain.    Underwent cardiac catheterization with PCI to his proximal LAD in 2005.  Recent admission showed low stable flat high-sensitivity troponins and was felt to be MSK in nature. No plans for ischemic evaluation  Coronary artery disease-denies exertional chest pain.  Has resumed normal daily activities.  Previous PCI in 2005 to his proximal LAD. Continue current medical regimen  Hyperlipidemia-01/23/2022: Cholesterol 207; HDL 66; LDL Cholesterol 104; Triglycerides 186; VLDL 37.  Continue aspirin, rosuvastatin Continue ezetimibe Heart healthy low-sodium high-fiber diet.   Increase physical activity as tolerated Repeat fasting lipids and LFTs  Essential hypertension-BP today 138/82 Continue amlodipine,  Increase carvedilol 25 mg BID Maintain blood pressure log  Urinary urgency-reports that for the past several weeks he has had increased urgency.  He denies burning sensation and malodorous urine.  Has follow-up appointment with PCP tomorrow.  I will defer antibiotic prescription to PCP. Order UA    Disposition: Follow-up with Dr. Rennis Golden or me in 3-4 months.   Thomasene Ripple. Wilhelm Ganaway NP-C     12/14/2022, 4:09 PM [facility] [address] Office [phone] Fax [phone]  Notice: This dictation was prepared with Dragon dictation along with smaller phrase technology. Any transcriptional errors that result from this process are unintentional and may not be corrected upon review.  I spent 14*** minutes examining this patient, reviewing medications, and using patient centered shared decision making involving  her cardiac care.  Prior to her visit I spent greater than 20 minutes reviewing her past medical history,  medications, and prior cardiac tests.

## 2022-12-16 ENCOUNTER — Ambulatory Visit: Payer: Medicare HMO | Admitting: General Practice

## 2022-12-19 ENCOUNTER — Ambulatory Visit: Payer: Medicare HMO | Admitting: Pharmacist

## 2022-12-19 ENCOUNTER — Telehealth: Payer: Self-pay | Admitting: Pharmacist

## 2022-12-19 NOTE — Telephone Encounter (Signed)
Attempted to contact patient for follow-up of missed appointment   Left HIPAA compliant voice mail requesting call back to direct phone: [phone] to rescedule.  Total time with patient call and documentation of interaction: 4 minutes.  Follow-up phone call planned: at Appointment.

## 2022-12-22 ENCOUNTER — Encounter: Payer: Self-pay | Admitting: Pharmacist

## 2022-12-22 ENCOUNTER — Ambulatory Visit (INDEPENDENT_AMBULATORY_CARE_PROVIDER_SITE_OTHER): Payer: Medicare HMO | Admitting: Pharmacist

## 2022-12-22 VITALS — BP 175/97 | HR 89 | Ht 65.0 in | Wt 208.6 lb

## 2022-12-22 DIAGNOSIS — E119 Type 2 diabetes mellitus without complications: Secondary | ICD-10-CM

## 2022-12-22 DIAGNOSIS — I10 Essential (primary) hypertension: Secondary | ICD-10-CM

## 2022-12-22 DIAGNOSIS — Z794 Long term (current) use of insulin: Secondary | ICD-10-CM | POA: Diagnosis not present

## 2022-12-22 MED ORDER — BASAGLAR KWIKPEN 100 UNIT/ML ~~LOC~~ SOPN: 45.0000 [IU] | PEN_INJECTOR | Freq: Every day | SUBCUTANEOUS | 2 refills | Status: DC

## 2022-12-22 MED ORDER — FIASP FLEXTOUCH 100 UNIT/ML ~~LOC~~ SOPN: 22.0000 [IU] | PEN_INJECTOR | Freq: Two times a day (BID) | SUBCUTANEOUS | 2 refills | Status: DC

## 2022-12-22 MED ORDER — METFORMIN HCL 500 MG PO TABS: 500.0000 mg | ORAL_TABLET | Freq: Two times a day (BID) | ORAL | Status: DC

## 2022-12-22 MED ORDER — EMPAGLIFLOZIN 25 MG PO TABS: 25.0000 mg | ORAL_TABLET | Freq: Every day | ORAL | 3 refills | Status: DC

## 2022-12-22 MED ORDER — FREESTYLE LIBRE 3 SENSOR MISC: 2.0000 | 11 refills | Status: DC | PRN

## 2022-12-22 NOTE — Assessment & Plan Note (Signed)
Hypertension longstanding currently uncontrolled. Patient did not take blood pressure medications this morning prior to visit. Blood pressure goal of <130 mmHg. Medication adherence appears to be fair. -Continued amlodipine 10 mg once daily. -Continued carvedilol 25mg  twice daily -Continued valsartan-hydrochlorothiazide 320-12.5mg  once daily. -Asked patient to check blood pressures at home and record readings to bring to follow up visit. Consider increasing valsartan-hydrochlorothiazide to 320-25mg  at follow-up visit if pressures remain elevated.

## 2022-12-22 NOTE — Assessment & Plan Note (Signed)
Diabetes longstanding currently uncontrolled but slightly improved. Patient is able to verbalize appropriate hypoglycemia management plan. Medication adherence appears fair. Control is suboptimal due to need for further medication management. -Increased dose of basal insulin Basaglar (insulin glargine) from 40 units to 45 units daily in the morning.  -Increased dose of rapid insulin Fiasp (insulin aspart) from 20 units twice daily to 22 units twice daily.  -Increased dose of SGLT2-I Jardiance (empagliflozin) from 10 mg to 20 mg once daily until 10mg  tablets are gone. Then, patient will start new prescription of Jardiance (empagliflozin) 25mg  once daily. Counseled on sick day rules. -Decreased dose of metformin from 1,000mg  twice daily to 500mg  twice daily due to patient's concern for metformin causing minor forgetfulness.  -Patient educated on purpose, proper use, and potential adverse effects.  -Extensively discussed pathophysiology of diabetes, recommended lifestyle interventions, dietary effects on blood sugar control.  -Counseled on s/sx of and management of hypoglycemia.  -Next A1c anticipated Oct-Nov 2024.

## 2022-12-22 NOTE — Progress Notes (Signed)
S:     Chief Complaint  Patient presents with   Medication Management    Diabetes follow up   72 y.o. male who presents for diabetes evaluation, education, and management. Patient arrives in good spirits and presents without any assistance.  Patient was referred and last seen by Primary Care Provider, Dr. Clayborne Artist, on 08/31/2022. Patient last seen in pharmacy clinic on 12/06/22.   PMH is significant for hypertension, diabetes.  At last visit, basal insulin Basaglar (insulin glargine) was increased from 35 units to 40 units daily, rapid acting insulin Fiasp (insulin aspart) was increased from 15 units to 20 units before 2 largest meals of the day.   Current diabetes medications include: Basaglar (insulin glargine) 40 units daily, Fiasp (insulin aspart) 20 units twice daily, Jardiance (empagliflozin) 10 mg daily, metformin 1,000mg  twice daily  Current hypertension medications include: amlodipine 10 mg once daily,  carvedilol 25mg  twice daily, valsartan-hydrochlorothiazide 320-12.5mg  once daily Current hyperlipidemia medications include: rosuvastatin 40mg  daily  Patient reports adherence to taking all medications as prescribed.   Do you feel that your medications are working for you? yes Have you been experiencing any side effects to the medications prescribed? yes Insurance coverage: [employer]  O:  Review of Systems  All other systems reviewed and are negative.  Physical Exam Constitutional:      Appearance: Normal appearance.  Pulmonary:     Effort: Pulmonary effort is normal.  Neurological:     Mental Status: He is alert.  Psychiatric:        Mood and Affect: Mood normal.        Behavior: Behavior normal.        Thought Content: Thought content normal.        Judgment: Judgment normal.     Libre3 CGM Download on 12/19/22 % Time CGM is active: 76% Average Glucose: 299 mg/dL Glucose Management Indicator: 10.5  Glucose Variability: 23.2% (goal <36%) Time in Goal:   - Time in range 70-180: 5% - Time above range: 95% - Time below range: 0% Observed patterns:   Lab Results  Component Value Date   HGBA1C 13.1 (A) 08/31/2022   Vitals:   12/22/22 0859 12/22/22 0902  BP: (!) 189/97 (!) 175/97  Pulse: 89   SpO2: 100%     Lipid Panel     Component Value Date/Time   CHOL 207 (H) 01/23/2022 0430   CHOL 243 (H) 08/23/2021 1458   TRIG 186 (H) 01/23/2022 0430   HDL 66 01/23/2022 0430   HDL 77 08/23/2021 1458   CHOLHDL 3.1 01/23/2022 0430   VLDL 37 01/23/2022 0430   LDLCALC 104 (H) 01/23/2022 0430   LDLCALC 141 (H) 08/23/2021 1458   LDLDIRECT 56 09/10/2020 0947    Clinical Atherosclerotic Cardiovascular Disease (ASCVD): Yes  The 10-year ASCVD risk score (Arnett DK, et al., 2019) is: 51.7%   Values used to calculate the score:     Age: 41 years     Sex: Male     Is Non-Hispanic African American: Yes     Diabetic: Yes     Tobacco smoker: No     Systolic Blood Pressure: 175 mmHg     Is BP treated: Yes     HDL Cholesterol: 66 mg/dL     Total Cholesterol: 207 mg/dL    A/P: Diabetes longstanding currently uncontrolled but slightly improved. Patient is able to verbalize appropriate hypoglycemia management plan. Medication adherence appears fair. Control is suboptimal due to need for further medication  management. -Increased dose of basal insulin Basaglar (insulin glargine) from 40 units to 45 units daily in the morning.  -Increased dose of rapid insulin Fiasp (insulin aspart) from 20 units twice daily to 22 units twice daily.  -Increased dose of SGLT2-I Jardiance (empagliflozin) from 10 mg to 20 mg once daily until 10mg  tablets are gone. Then, patient will start new prescription of Jardiance (empagliflozin) 25mg  once daily. Counseled on sick day rules. -Decreased dose of metformin from 1,000mg  twice daily to 500mg  twice daily due to patient's concern for metformin causing minor forgetfulness.  -Patient educated on purpose, proper use, and  potential adverse effects.  -Extensively discussed pathophysiology of diabetes, recommended lifestyle interventions, dietary effects on blood sugar control.  -Counseled on s/sx of and management of hypoglycemia.  -Next A1c anticipated Oct-Nov 2024.   Hypertension longstanding currently uncontrolled. Patient did not take blood pressure medications this morning prior to visit. Blood pressure goal of <130 mmHg. Medication adherence appears to be fair. -Continued amlodipine 10 mg once daily. -Continued carvedilol 25mg  twice daily -Continued valsartan-hydrochlorothiazide 320-12.5mg  once daily. -Asked patient to check blood pressures at home and record readings to bring to follow up visit. Consider increasing valsartan-hydrochlorothiazide to 320-25mg  at follow-up visit if pressures remain elevated.  Written patient instructions provided. Patient verbalized understanding of treatment plan.  Total time in face to face counseling 34 minutes.    Follow-up:  Pharmacist 01/12/23 PCP clinic visit in to be determined Patient seen with Rickey Primus, PharmD Candidate and Andee Poles, PharmD Candidate.

## 2022-12-22 NOTE — Patient Instructions (Addendum)
It was nice to see you today!  Your goal blood sugar is 80-130 before eating and less than 180 after eating.  Medication Changes: Increase Jardiance (empagliflozin) to 2 10mg  tablets once daily until your current supply is gone. - Then, pick up new prescription of Jardiance (empagliflozin) 25mg  tablets once daily  Increase Basaglar KwikPen (insulin glargine) to 45 units once daily in the mornings.  Increase Fiasp FlexTouch (insulin aspart) to 22 units twice daily with meals (lunch and dinner).  Decrease metformin to 500mg  (1 tablet) twice daily  Continue all other medication the same.  Monitor blood pressures at home and keep a log (piece of paper) to bring with you to your next visit. Also, please bring all of your medications with you to your follow up appointment on September 19th at 2:30pm.  Keep up the good work with diet and exercise. Aim for a diet full of vegetables, fruit and lean meats (chicken, [place], fish). Try to limit salt intake by eating fresh or frozen vegetables (instead of canned), rinse canned vegetables prior to cooking and do not add any additional salt to meals.

## 2022-12-23 NOTE — Progress Notes (Signed)
Reviewed and agree with Dr Koval's plan.   

## 2023-01-04 DIAGNOSIS — E113212 Type 2 diabetes mellitus with mild nonproliferative diabetic retinopathy with macular edema, left eye: Secondary | ICD-10-CM | POA: Diagnosis not present

## 2023-01-12 ENCOUNTER — Ambulatory Visit: Payer: Medicare HMO | Admitting: Pharmacist

## 2023-01-25 ENCOUNTER — Other Ambulatory Visit: Payer: Self-pay | Admitting: Family Medicine

## 2023-01-30 ENCOUNTER — Encounter: Payer: Self-pay | Admitting: Student

## 2023-01-30 NOTE — Telephone Encounter (Signed)
Wife returns call to nurse line. She states that they found insulin in the fridge and to disregard previous message.   Veronda Prude, RN

## 2023-02-13 ENCOUNTER — Telehealth: Payer: Self-pay | Admitting: Pharmacist

## 2023-02-13 NOTE — Telephone Encounter (Signed)
Patient contacted to schedule follow-up appointment after previous appointment on 01/12/2023 was cancelled. Mrs. [name] and [name] answered the phone. Appointment was rescheduled for 02/21/2023 at 3:30 pm.  Total time with patient call and documentation of interaction: 7 minutes

## 2023-02-21 ENCOUNTER — Encounter: Payer: Self-pay | Admitting: Pharmacist

## 2023-02-21 ENCOUNTER — Ambulatory Visit: Payer: Medicare HMO | Admitting: Pharmacist

## 2023-02-21 VITALS — BP 176/95 | HR 86 | Wt 206.2 lb

## 2023-02-21 DIAGNOSIS — E119 Type 2 diabetes mellitus without complications: Secondary | ICD-10-CM | POA: Diagnosis not present

## 2023-02-21 DIAGNOSIS — Z794 Long term (current) use of insulin: Secondary | ICD-10-CM | POA: Diagnosis not present

## 2023-02-21 NOTE — Progress Notes (Signed)
S:     Chief Complaint  Patient presents with   Medication Management    T2DM    72 y.o. male who presents for diabetes evaluation, education, and management. Patient arrives in good spirits and presents without any assistance.  Patient was referred and last seen by Primary Care Provider, Dr. Raymondo Band, on 12/22/2022.   PMH is significant for Hypertension, T2DM.  At last visit, basal insulin (basaglar), insulin aspart (Fiasp), and Jardiance were increased. Metformin dose was decreased. Consideration of valsartan-hydrochlorothiazide to 320-25 mg if pressures remained elevated.  Patient reports Diabetes was diagnosed in 2019.   Family/Social History: Mother (T2DM), Sister (T2DM), 99 yo Grandson died recently  Current diabetes medications include: basal insulin Basaglar (insulin glargine), rapid insulin Fiasp flextouch (insulin aspart), Jardiance (empagliflozin), metformin 500 mg BID.  Current hypertension medications include: Amlodipine 10 mg, Carvedilol 25 mg BID, Valsartan-hydrochlorothiazide 320-12.5mg  Current hyperlipidemia medications include: Rosuvastatin 40 mg, Aspirin 81 mg  Patient is a poor historian but reports adherence to taking all medications as prescribed.   Do you feel that your medications are working for you? yes Have you been experiencing any side effects to the medications prescribed? Yes Do you have any problems obtaining medications due to transportation or finances? no Insurance coverage: [facility] Medicare  Patient denies hypoglycemic events.  Reported home fasting blood sugars: 200's   Patient reports nocturia (nighttime urination).  Patient denies neuropathy (nerve pain). Patient denies visual changes. Patient reports self foot exams.   Patient reported dietary habits: Eats 2 meals/day Breakfast: eggs, toast, grits, oatmeal Lunch: sandwich, hamburgers, fried bologna sandwiches  Dinner:  Snacks: mainly consumes calories from snacking  Drinks: drinking  a lot of juices  Within the past 12 months, did you worry whether your food would run out before you got money to buy more? no Within the past 12 months, did the food you bought run out, and you didn't have money to get more? no  Patient-reported exercise habits: Meredeth Ide goes a half mile twice a week, and mows lawns   O:   ROS  Physical Exam Constitutional:      Appearance: Normal appearance.  Neurological:     Mental Status: He is alert.  Psychiatric:        Mood and Affect: Mood normal.        Behavior: Behavior normal.        Thought Content: Thought content normal.     7 day average blood glucose: 377 mg/dL  Libre3 CGM Download today 02/21/2023 % Time CGM is active: 83% Average Glucose: 377 mg/dL Glucose Management Indicator: 12.3%  Glucose Variability: 10.6% (goal <36%) Time in Goal:  - Time in range 70-180: 0% - Time above range: 100% - Time below range: 0% Observed patterns: Glucose does go down slightly with 11 am insulin dose   Lab Results  Component Value Date   HGBA1C 13.1 (A) 08/31/2022   Vitals:   02/21/23 1553 02/21/23 1556  BP: (!) 165/85 (!) 176/95  Pulse: 86   SpO2: 100%     Lipid Panel     Component Value Date/Time   CHOL 207 (H) 01/23/2022 0430   CHOL 243 (H) 08/23/2021 1458   TRIG 186 (H) 01/23/2022 0430   HDL 66 01/23/2022 0430   HDL 77 08/23/2021 1458   CHOLHDL 3.1 01/23/2022 0430   VLDL 37 01/23/2022 0430   LDLCALC 104 (H) 01/23/2022 0430   LDLCALC 141 (H) 08/23/2021 1458   LDLDIRECT 56 09/10/2020 0947  Clinical Atherosclerotic Cardiovascular Disease (ASCVD): Yes  The 10-year ASCVD risk score (Arnett DK, et al., 2019) is: 52%   Values used to calculate the score:     Age: 28 years     Sex: Male     Is Non-Hispanic African American: Yes     Diabetic: Yes     Tobacco smoker: No     Systolic Blood Pressure: 176 mmHg     Is BP treated: Yes     HDL Cholesterol: 66 mg/dL     Total Cholesterol: 207 mg/dL    A/P: Diabetes  longstanding currently uncontrolled. Patient is able to verbalize appropriate hypoglycemia management plan. Medication adherence appears poor, wife fills in weekly pill planner for him. Control is suboptimal due to poor medication adherence. -Continued basal insulin Basaglar (insulin glargine) 45 units in the morning.  -Restarted rapid insulin Fiasp flextouch (insulin aspart) 22 units two times daily before meals -Continued SGLT2-I Jardiance (empagliflozin) 25 mg daily.  -Continued metformin 500 mg BID.  -Patient educated on purpose, proper use, and potential adverse effects of insulins.  -Extensively discussed pathophysiology of diabetes, recommended lifestyle interventions, dietary effects on blood sugar control.  -Counseled on s/sx of and management of hypoglycemia.  -Next A1c anticipated <10.   ASCVD risk - primary prevention in patient with diabetes. Last LDL is 104 (01/24/2023) not at goal of <70  mg/dL. ASCVD risk factors and 10-year ASCVD risk score of 52%. high intensity statin indicated.  -Continued Rosuvastatin 40 mg.  -Continued Aspirin 81 mg  Hypertension longstanding currently uncontrolled. Blood pressure goal of <130/80 mmHg. Medication adherence poor as a result of being a poor historian. Blood pressure control is suboptimal due to BP of 176/95. -Continued Amlodipine 10 mg. -Restarted Carvedilol 25 mg BID -Continued Valsartan-hydrochlorothiazide 320-12.5mg   Written patient instructions provided. Patient verbalized understanding of treatment plan.  Total time in face to face counseling 27 minutes.    Follow-up:  Pharmacist Dr. Raymondo Band, PharmD on 03/07/2023   PCP clinic visit in PRN Patient seen with Caprice Beaver, PharmD Candidate and Shona Simpson, PharmD Candidate.

## 2023-02-21 NOTE — Patient Instructions (Addendum)
It was nice to see you today!  Your goal blood sugar is 80-130 before eating and less than 180 after eating.  Medication Changes: START insulin aspart (Fiasp - blue pen) 22 units two times a day before meals. CONTINUE insulin glargine (Basaglar - gray pen) 45 units once daily in the morning.  Carvedilol 25 mg (one pill) take two times daily Metformin 500 mg (one pill) take two times daily  Continue all other medication the same.   BRING ALL OF MEDICATIONS AND PILL BOX TO NEXT VISIT!!!  Keep up the good work with diet and exercise. Aim for a diet full of vegetables, fruit and lean meats (chicken, [place], fish). Try to limit salt intake by eating fresh or frozen vegetables (instead of canned), rinse canned vegetables prior to cooking and do not add any additional salt to meals.

## 2023-02-21 NOTE — Assessment & Plan Note (Signed)
Diabetes longstanding currently uncontrolled. Patient is able to verbalize appropriate hypoglycemia management plan. Medication adherence appears poor, wife fills in weekly pill planner for him. Control is suboptimal due to poor medication adherence. -Continued basal insulin Basaglar (insulin glargine) 45 units in the morning.  -Restarted rapid insulin Fiasp flextouch (insulin aspart) 22 units two times daily before meals -Continued SGLT2-I Jardiance (empagliflozin) 25 mg daily.  -Continued metformin 500 mg BID.  -Patient educated on purpose, proper use, and potential adverse effects of insulins.  -Extensively discussed pathophysiology of diabetes, recommended lifestyle interventions, dietary effects on blood sugar control.  -Counseled on s/sx of and management of hypoglycemia.  -Next A1c anticipated <10.

## 2023-02-22 NOTE — Progress Notes (Signed)
Reviewed and agree with Dr Koval's plan.   

## 2023-03-01 ENCOUNTER — Ambulatory Visit: Payer: Medicare HMO | Admitting: Student

## 2023-03-01 VITALS — BP 167/104 | HR 88 | Ht 65.0 in | Wt 210.8 lb

## 2023-03-01 DIAGNOSIS — E119 Type 2 diabetes mellitus without complications: Secondary | ICD-10-CM | POA: Diagnosis not present

## 2023-03-01 DIAGNOSIS — E782 Mixed hyperlipidemia: Secondary | ICD-10-CM | POA: Diagnosis not present

## 2023-03-01 DIAGNOSIS — Z794 Long term (current) use of insulin: Secondary | ICD-10-CM

## 2023-03-01 DIAGNOSIS — I10 Essential (primary) hypertension: Secondary | ICD-10-CM

## 2023-03-01 DIAGNOSIS — R350 Frequency of micturition: Secondary | ICD-10-CM | POA: Diagnosis not present

## 2023-03-01 LAB — POCT URINALYSIS DIP (MANUAL ENTRY)
Bilirubin, UA: NEGATIVE
Glucose, UA: 100 mg/dL — AB
Ketones, POC UA: NEGATIVE mg/dL
Leukocytes, UA: NEGATIVE
Nitrite, UA: NEGATIVE
Protein Ur, POC: >=300 mg/dL — AB
Spec Grav, UA: >=1.03 — AB (ref 1.010–1.025)
Urobilinogen, UA: 0.2 U/dL
pH, UA: 5.5 (ref 5.0–8.0)

## 2023-03-01 LAB — POCT GLYCOSYLATED HEMOGLOBIN (HGB A1C): HbA1c, POC (controlled diabetic range): 12.9 % — AB (ref 0.0–7.0)

## 2023-03-01 NOTE — Patient Instructions (Addendum)
Blood Pressure Record Sheet To take your blood pressure, you will need a blood pressure machine. You can buy a blood pressure machine (blood pressure monitor) at your clinic, drug store, or online. When choosing one, consider: An automatic monitor that has an arm cuff. A cuff that wraps snugly around your upper arm. You should be able to fit only one finger between your arm and the cuff. A device that stores blood pressure reading results. Do not choose a monitor that measures your blood pressure from your wrist or finger. Follow your health care provider's instructions for how to take your blood pressure. To use this form: Take your blood pressure medications every day These measurements should be taken when you have been at rest for at least 10-15 min Take at least 2 readings with each blood pressure check. This makes sure the results are correct. Wait 1-2 minutes between measurements. Write down the results in the spaces on this form. Keep in mind it should always be recorded systolic over diastolic. Both numbers are important.  Repeat this every day for 2-3 weeks, or as told by your health care provider.  Make a follow-up appointment with your health care provider to discuss the results.  Blood Pressure Log Date Medications taken? (Y/N) Blood Pressure Time of Day                                                                                                        Stop taking Jardiance/empagliflozin. Continue taking all of your other medications as prescribed.   Future Appointments  Date Time Provider Department Center  03/03/2023  1:30 PM ACCESS TO CARE POOL [facility]  03/14/2023  2:30 PM Kathrin Ruddy, RPH-CPP [facility]    Please arrive 15 minutes before your appointment to ensure smooth check in process.    Please call the clinic at [phone] if your symptoms worsen or you have any concerns.  Thank you for allowing me to participate in your  care, Dr. Glendale Chard [facility]

## 2023-03-01 NOTE — Progress Notes (Signed)
SUBJECTIVE:   CHIEF COMPLAINT / HPI:   [name] is a 72 y.o. male  presenting for checkup.  Hypertension: BP: (!) 167/104 today. Home medications include: Amlodipine 10 mg, Coreg 25 mg twice daily, valsartan HCTZ 320-12.5 mg daily. He endorses taking these medications as prescribed however he reports his blood pressures at home are elevated in the morning in the 200s systolic and after he takes his medications they improved to the 130s.  Most recent creatinine trend:  Lab Results  Component Value Date   CREATININE 1.30 (H) 10/11/2022   CREATININE 1.44 (H) 08/31/2022   CREATININE 1.54 (H) 02/17/2022   Patient has had a BMP in the past 1 year.  Increased urinary frequency: Has been present for 2 weeks.  He denies hematuria, pain, foul-smelling urine.  He reports the urine volume has been increased with every urination.  He reports taking his insulin as prescribed however last A1c was 13.16 months ago.  He denies fever, chills.  Type 2 Diabetes: Home medications include: Metformin 500 mg twice daily, glargine 45 units daily, aspart 22 units twice daily before meals. Does endorse compliance. Home glucose monitoring is performed regularly.   Most recent A1Cs:  Lab Results  Component Value Date   HGBA1C 12.9 (A) 03/01/2023   HGBA1C 13.1 (A) 08/31/2022   Last Microalbumin, LDL, Creatinine: Lab Results  Component Value Date   LDLCALC 104 (H) 01/23/2022   CREATININE 1.30 (H) 10/11/2022    Patient is up to date on diabetic eye. Patient is up to date on diabetic foot exam.  Moles: Present for many years causing cosmetic and some pain on his back.   Interested in having these removed.  PERTINENT  PMH / PSH: Reviewed and updated   OBJECTIVE:   BP (!) 167/104   Pulse 88   Ht 5\' 5"  (1.651 m)   Wt 210 lb 12.8 oz (95.6 kg)   SpO2 99%   BMI 35.08 kg/m   Well-appearing, no acute distress Cardio: Regular rate, regular rhythm, no murmurs on exam. Pulm: Clear, no wheezing, no  crackles. No increased work of breathing Abdominal: bowel sounds present, soft, non-tender, non-distended Extremities: no peripheral edema  Neuro: alert and oriented x3, speech normal in content, no facial asymmetry, strength intact and equal bilaterally in UE and LE, pupils equal and reactive to light.  Skin: 1 cm raised, dry scaly mole located on posterior trunk on the left side.  Raised 1-1/2 cm in diameter mole under right armpit.          08/31/2022   10:22 AM 08/29/2022    4:12 PM 02/17/2022    2:51 PM  PHQ9 SCORE ONLY  PHQ-9 Total Score 5 0 10      ASSESSMENT/PLAN:   Essential hypertension, benign Blood pressure severely elevated on presentation but improved with recheck.  Patient reports not taking his medications this morning suspect there is possible inconsistencies with how he is taking his prescribed medications.  Reiterated what medicines he should be taking and when.  Reassuring neurological exam patient is feeling well without headaches, visual disturbance.  Appointment for follow-up scheduled on 11/8 to recheck blood pressure and adjust medications.  He was most recently started on Cozaar 25 mg twice daily, suspect he may need to have a dose increase on this medication at next visit. - Check BMP today  Type 2 diabetes mellitus without complication, with long-term current use of insulin (HCC) A1c rechecked today and improved from 13.16 months ago at  12.9.  Suspect increased urinary frequency is related to elevated blood sugar.  Due to patient having A1c above 9 discontinue Jardiance at this visit.  Instructed patient to continue with follow-up appointment with Dr. Raymondo Band on 11/19. - Check BMP - Check lipid panel - Check UA, resulted normal without signs of urinary infection with moderate glucose   Moles: Pictures obtained in the chart.  Will forward to [facility] clinic for scheduling.  Glendale Chard, DO [facility]

## 2023-03-01 NOTE — Assessment & Plan Note (Signed)
Blood pressure severely elevated on presentation but improved with recheck.  Patient reports not taking his medications this morning suspect there is possible inconsistencies with how he is taking his prescribed medications.  Reiterated what medicines he should be taking and when.  Reassuring neurological exam patient is feeling well without headaches, visual disturbance.  Appointment for follow-up scheduled on 11/8 to recheck blood pressure and adjust medications.  He was most recently started on Cozaar 25 mg twice daily, suspect he may need to have a dose increase on this medication at next visit. - Check BMP today

## 2023-03-01 NOTE — Assessment & Plan Note (Addendum)
A1c rechecked today and improved from 13.16 months ago at 12.9.  Suspect increased urinary frequency is related to elevated blood sugar.  Due to patient having A1c above 9 discontinue Jardiance at this visit.  Instructed patient to continue with follow-up appointment with Dr. Raymondo Band on 11/19. - Check BMP - Check lipid panel - Check UA, resulted normal without signs of urinary infection with moderate glucose

## 2023-03-02 LAB — BASIC METABOLIC PANEL
BUN/Creatinine Ratio: 15 (ref 10–24)
BUN: 21 mg/dL (ref 8–27)
CO2: 23 mmol/L (ref 20–29)
Calcium: 9.5 mg/dL (ref 8.6–10.2)
Chloride: 101 mmol/L (ref 96–106)
Creatinine, Ser: 1.37 mg/dL — ABNORMAL HIGH (ref 0.76–1.27)
Glucose: 250 mg/dL — ABNORMAL HIGH (ref 70–99)
Potassium: 4.1 mmol/L (ref 3.5–5.2)
Sodium: 139 mmol/L (ref 134–144)
eGFR: 55 mL/min/{1.73_m2} — ABNORMAL LOW (ref >59)

## 2023-03-02 LAB — LIPID PANEL
Chol/HDL Ratio: 5 ratio (ref 0.0–5.0)
Cholesterol, Total: 365 mg/dL — ABNORMAL HIGH (ref 100–199)
HDL: 73 mg/dL (ref >39)
LDL Chol Calc (NIH): 256 mg/dL — ABNORMAL HIGH (ref 0–99)
Triglycerides: 184 mg/dL — ABNORMAL HIGH (ref 0–149)
VLDL Cholesterol Cal: 36 mg/dL (ref 5–40)

## 2023-03-03 ENCOUNTER — Ambulatory Visit (INDEPENDENT_AMBULATORY_CARE_PROVIDER_SITE_OTHER): Payer: Medicare HMO | Admitting: Student

## 2023-03-03 VITALS — BP 140/90 | HR 79 | Wt 211.6 lb

## 2023-03-03 DIAGNOSIS — L989 Disorder of the skin and subcutaneous tissue, unspecified: Secondary | ICD-10-CM

## 2023-03-03 DIAGNOSIS — I10 Essential (primary) hypertension: Secondary | ICD-10-CM | POA: Diagnosis not present

## 2023-03-03 NOTE — Patient Instructions (Addendum)
It was great to see you today! Thank you for choosing [facility] for your primary care.  Today we addressed: Please check blood pressure at home    Blood Pressure Record Sheet To take your blood pressure, you will need a blood pressure machine. You can buy a blood pressure machine (blood pressure monitor) at your clinic, drug store, or online. When choosing one, consider: An automatic monitor that has an arm cuff. A cuff that wraps snugly around your upper arm. You should be able to fit only one finger between your arm and the cuff. A device that stores blood pressure reading results. Do not choose a monitor that measures your blood pressure from your wrist or finger. Follow your health care provider's instructions for how to take your blood pressure. To use this form: Take your blood pressure medications every day These measurements should be taken when you have been at rest for at least 10-15 min Take at least 2 readings with each blood pressure check. This makes sure the results are correct. Wait 1-2 minutes between measurements. Write down the results in the spaces on this form. Keep in mind it should always be recorded systolic over diastolic. Both numbers are important.  Repeat this every day for 2-3 weeks, or as told by your health care provider.  Make a follow-up appointment with your health care provider to discuss the results.  Blood Pressure Log Date Medications taken? (Y/N) Blood Pressure Time of Day                                                                                                         If you haven't already, sign up for My Chart to have easy access to your labs results, and communication with your primary care physician. I recommend that you always bring your medications to each appointment as this makes it easy to ensure you are on the correct medications and helps [place] not miss refills when you need them. Call the clinic at  [phone] if your symptoms worsen or you have any concerns. Return in about 4 weeks (around 03/31/2023) for HTN. Please arrive 15 minutes before your appointment to ensure smooth check in process.  We appreciate your efforts in making this happen.  Thank you for allowing me to participate in your care, Alfredo Martinez, MD 03/03/2023, 2:16 PM PGY-3, [facility]

## 2023-03-03 NOTE — Assessment & Plan Note (Signed)
Elevated x 2.  Patient has yet to take his medications today so I am unable to obtain a valid blood pressure.  I did give him a blood pressure log to use until his next visit and instructed him to return in 1 month for recheck.  Up to date on BMP.

## 2023-03-03 NOTE — Progress Notes (Signed)
    SUBJECTIVE:   CHIEF COMPLAINT / HPI:   Hypertension: BP: (!) 140/90 today. Home medications include: Amlodipine 10 mg, carvedilol 25 mg, Diovan HCTZ 320-12.5 mg. He endorses taking these medications as prescribed. Does check blood pressure at home.  Patient reports that he has not yet taken his blood pressure medications today as he usually takes them around 2 PM.  He does report that he takes pressures at home intermittently but has not recently.  Most recent creatinine trend:  Lab Results  Component Value Date   CREATININE 1.37 (H) 03/01/2023   CREATININE 1.30 (H) 10/11/2022   CREATININE 1.44 (H) 08/31/2022   Patient has had a BMP in the past 1 year.  Patient denies any chest pain or shortness of breath today.  Patient initially presented for 'mole' removal.  See photos below       PERTINENT  PMH / PSH:  Atypical angina CAD Hypertension Heart failure with mildly reduced ejection fraction Type 2 diabetes History of coronary artery stent placement Hyperlipidemia Low level of literacy  OBJECTIVE:   BP (!) 140/90   Pulse 79   Wt 211 lb 9.6 oz (96 kg)   SpO2 98%   BMI 35.21 kg/m   General: Alert and oriented in no apparent distress Heart: Regular rate and rhythm with no murmurs appreciated Lungs: Normal WOB Skin: Warm and dry Hyperpigmented raised area on back over left scapula Hyperpigmented lesion soft, hanging and axillary region of the right Extremities: No lower extremity edema   ASSESSMENT/PLAN:   Assessment & Plan Essential hypertension, benign Elevated x 2.  Patient has yet to take his medications today so I am unable to obtain a valid blood pressure.  I did give him a blood pressure log to use until his next visit and instructed him to return in 1 month for recheck.  Up to date on BMP. Skin lesion Both appear to possibly be seborrheic keratoses.  Will have them schedule with Derm clinic, approved by Dr. Lum Babe and scheduled the appointment while  he was in office.  Unable to remove today due to time constraints.    Alfredo Martinez, MD [facility]

## 2023-03-06 ENCOUNTER — Encounter: Payer: Self-pay | Admitting: Family Medicine

## 2023-03-07 ENCOUNTER — Ambulatory Visit: Payer: Medicare HMO | Admitting: Pharmacist

## 2023-03-07 DIAGNOSIS — H2512 Age-related nuclear cataract, left eye: Secondary | ICD-10-CM | POA: Diagnosis not present

## 2023-03-07 DIAGNOSIS — H25811 Combined forms of age-related cataract, right eye: Secondary | ICD-10-CM | POA: Diagnosis not present

## 2023-03-09 ENCOUNTER — Ambulatory Visit: Payer: Medicare HMO

## 2023-03-09 ENCOUNTER — Ambulatory Visit: Payer: Medicare HMO | Admitting: Pharmacist

## 2023-03-14 ENCOUNTER — Encounter: Payer: Self-pay | Admitting: Pharmacist

## 2023-03-14 ENCOUNTER — Ambulatory Visit: Payer: Medicare HMO | Admitting: Pharmacist

## 2023-03-14 VITALS — BP 157/83 | HR 87 | Wt 209.4 lb

## 2023-03-14 DIAGNOSIS — Z794 Long term (current) use of insulin: Secondary | ICD-10-CM

## 2023-03-14 DIAGNOSIS — E7800 Pure hypercholesterolemia, unspecified: Secondary | ICD-10-CM

## 2023-03-14 DIAGNOSIS — E119 Type 2 diabetes mellitus without complications: Secondary | ICD-10-CM

## 2023-03-14 DIAGNOSIS — I10 Essential (primary) hypertension: Secondary | ICD-10-CM | POA: Diagnosis not present

## 2023-03-14 MED ORDER — ROSUVASTATIN CALCIUM 40 MG PO TABS: 40.0000 mg | ORAL_TABLET | Freq: Every day | ORAL | 3 refills | Status: DC

## 2023-03-14 MED ORDER — EPLERENONE 25 MG PO TABS: 25.0000 mg | ORAL_TABLET | Freq: Every day | ORAL | 11 refills | Status: DC

## 2023-03-14 MED ORDER — FIASP FLEXTOUCH 100 UNIT/ML ~~LOC~~ SOPN: 24.0000 [IU] | PEN_INJECTOR | Freq: Two times a day (BID) | SUBCUTANEOUS | Status: DC

## 2023-03-14 NOTE — Assessment & Plan Note (Signed)
Diabetes longstanding currently uncontrolled. Patient is able to verbalize appropriate hypoglycemia management plan. Medication adherence appears poor, patient confused as to what medications are for what disease and some confusion on how many times daily he takes his medications. Reports that he has a pill box that his wife fills. Control is suboptimal due to poor medication adherence/literacy resulting in suboptimal medication regimen. -Increased insulin Basaglar (glargine) from patient reported 40 units daily to 45 units once daily (as prescribed on 10/29) -Increased insulin Fiasp (aspart) from patient reported 20 units twice daily with meals to 24 units twice daily with meals -Continued metformin 500 mg BID  -Consider at next appointment increasing metformin to 1000 mg BID  -Encouraged checking blood sugar ~5x before next appointment, varying times, to see better variety of BG readings -Patient educated on purpose, proper use, and potential adverse effects.  -Extensively discussed pathophysiology of diabetes, recommended lifestyle interventions, dietary effects on blood sugar control.  -Counseled on s/sx of and management of hypoglycemia.   ASCVD risk - primary prevention in patient with diabetes. Last LDL is 256 not at goal of <16 mg/dL. ASCVD risk factors include T2DM, HTN, HLD, CAD. high intensity statin indicated. Recent LDL may have been impacted by patient non adherence, however, adherence to medication regimen is not enough to get patient to goal LDL range. Poor patient literacy warranted not adding additional LDL lowering medication at this appointment, as to not confuse patient.  -Continued rosuvastatin 40 mg once daily -Consider adding ezetimibe (Zetia) 10 mg once daily at future appointment once HTN/T2DM have improved control  Hypertension longstanding currently uncontrolled. Blood pressure goal of <130/80 mmHg. Medication adherence poor as a result of being a poor historian. Blood  pressure control is suboptimal due to poor medication adherence/literacy resulting in suboptimal medication regimen.  -Added eplerenone 25 mg once daily  -Continued amlodipine 10 mg once daily, valsartan-hydrochlorothiazide 320-12.5 mg, carvedilol 25 mg BID -Consider alpha-blocker at future appointment based on control/tolerability of current regimen -Encouraged patient to monitor headaches, and report any changes at next appointment -BMET at next appointment

## 2023-03-14 NOTE — Assessment & Plan Note (Signed)
Hypertension longstanding currently uncontrolled. Blood pressure goal of <130/80 mmHg. Medication adherence poor as a result of being a poor historian. Blood pressure control is suboptimal due to poor medication adherence/literacy resulting in suboptimal medication regimen.  -Added eplerenone 25 mg once daily  -Continued amlodipine 10 mg once daily, valsartan-hydrochlorothiazide 320-12.5 mg, carvedilol 25 mg BID -Consider alpha-blocker at future appointment based on control/tolerability of current regimen -Encouraged patient to monitor headaches, and report any changes at next appointment -BMET at next appointment

## 2023-03-14 NOTE — Progress Notes (Signed)
S:     Chief Complaint  Patient presents with   Medication Management    Diabetes - Resistant Hypertension   72 y.o. male who presents for diabetes evaluation, education, and management. Patient arrives in good spirits and presents without any assistance. Patient reports minor headache that persists daily.     Patient was last seen by Primary Care Provider, Dr. Jena Gauss, on 03/03/2023.   PMH is significant for T2DM, HTN, HLD, HFmrEF, CAD.  At last visit, patient had not taken his BP medications, since he takes them at night, elevated BP in office of 140/90. Visit on 11/06 with Dr. Samara Deist, discontinued empagliflozin (Jardiance) 25 mg once daily.   At last pharmacy visit on 10/29 continued basal insulin Basaglar (insulin glargine) 45 units in the morning, restarted rapid insulin Fiasp flextouch (insulin aspart) 22 units two times daily before meals, continued SGLT2-I Jardiance (empagliflozin) 25 mg daily, continued metformin 500 mg BID.   Patient reports Diabetes was diagnosed in 2019.   Family/Social History: Mother (T2DM), Sister (T2DM)  Current diabetes medications include: insulin Fiasp (aspart) 22 units BID - taking 20 units, insulin Basaglar (glargine) 45 units once daily - taking 40 units, metformin 500 mg BID Current hypertension medications include: amlodipine 10 mg once daily, valsartan-hydrochlorothiazide 320-12.5 mg, carvedilol 25 mg BID Current hyperlipidemia medications include: rosuvastatin 40 mg once daily  Patient is a poor historian, but generally reports adherence to medications. Misses 1-2 days of PO medications per week, but takes both insulins every day.  Do you feel that your medications are working for you? Yes  Have you been experiencing any side effects to the medications prescribed? No  Insurance coverage: [employer]  Patient denies hypoglycemic events.  Reported home blood sugars:  -285 mg/dL -409 mg/dL -811 mg/dL -914 mg/dL  Reported home  BPs: -Systolic: ~200s  Patient reports nocturia (nighttime urination). ~2x nightly  Patient reported dietary habits: Eats 2 meals/day Breakfast: eggs, toast, grits, oatmeal, sausage & cheese bagel Lunch: sandwich, hamburgers, fried bologna sandwiches  Dinner:  Snacks: mainly consumes calories from snacking  Drinks: drinking a lot of juices  Patient-reported exercise habits: Nothing since weather has gotten cooler, walks dog occasionally  O:   Review of Systems  Neurological:  Positive for headaches.  All other systems reviewed and are negative.   Physical Exam Vitals reviewed.  Constitutional:      Appearance: Normal appearance.  Pulmonary:     Effort: Pulmonary effort is normal.  Neurological:     Mental Status: He is alert.  Psychiatric:        Mood and Affect: Mood normal.        Behavior: Behavior normal.        Thought Content: Thought content normal.        Judgment: Judgment normal.     Lab Results  Component Value Date   HGBA1C 12.9 (A) 03/01/2023   Vitals:   03/14/23 1434 03/14/23 1435  BP: (!) 176/86 (!) 157/83  Pulse: 87   SpO2: 99%     Lipid Panel     Component Value Date/Time   CHOL 365 (H) 03/01/2023 1119   TRIG 184 (H) 03/01/2023 1119   HDL 73 03/01/2023 1119   CHOLHDL 5.0 03/01/2023 1119   CHOLHDL 3.1 01/23/2022 0430   VLDL 37 01/23/2022 0430   LDLCALC 256 (H) 03/01/2023 1119   LDLDIRECT 56 09/10/2020 0947    Clinical Atherosclerotic Cardiovascular Disease (ASCVD): Yes  The ASCVD Risk score (Arnett DK,  et al., 2019) failed to calculate for the following reasons:   The valid total cholesterol range is 130 to 320 mg/dL   A/P: Diabetes longstanding currently uncontrolled. Patient is able to verbalize appropriate hypoglycemia management plan. Medication adherence appears poor, patient confused as to what medications are for what disease and some confusion on how many times daily he takes his medications. Reports that he has a pill box that  his wife fills. Control is suboptimal due to poor medication adherence/literacy resulting in suboptimal medication regimen. -Increased insulin Basaglar (glargine) from patient reported 40 units daily to 45 units once daily (as prescribed on 10/29) -Increased insulin Fiasp (aspart) from patient reported 20 units twice daily with meals to 24 units twice daily with meals -Continued metformin 500 mg BID  -Consider at next appointment increasing metformin to 1000 mg BID  -Encouraged checking blood sugar ~5x before next appointment, varying times, to see better variety of BG readings -Patient educated on purpose, proper use, and potential adverse effects.  -Extensively discussed pathophysiology of diabetes, recommended lifestyle interventions, dietary effects on blood sugar control.  -Counseled on s/sx of and management of hypoglycemia.   ASCVD risk - primary prevention in patient with diabetes. Last LDL is 256 not at goal of <16 mg/dL. ASCVD risk factors include T2DM, HTN, HLD, CAD. high intensity statin indicated. Recent LDL may have been impacted by patient non adherence, however, adherence to medication regimen is not enough to get patient to goal LDL range. Poor patient literacy warranted not adding additional LDL lowering medication at this appointment, as to not confuse patient.  -Continued rosuvastatin 40 mg once daily -Consider adding ezetimibe (Zetia) 10 mg once daily at future appointment once HTN/T2DM have improved control  Hypertension longstanding currently uncontrolled. Blood pressure goal of <130/80 mmHg. Medication adherence poor as a result of being a poor historian. Blood pressure control is suboptimal due to poor medication adherence/literacy resulting in suboptimal medication regimen.  -Added eplerenone 25 mg once daily  -Continued amlodipine 10 mg once daily, valsartan-hydrochlorothiazide 320-12.5 mg, carvedilol 25 mg BID -Consider alpha-blocker at future appointment based on  control/tolerability of current regimen -Encouraged patient to monitor headaches, and report any changes at next appointment -BMET at next appointment  Recommended patient bring all medications and pill box to next appointment  Written patient instructions provided. Patient verbalized understanding of treatment plan.  Total time in face to face counseling 49 minutes.    Follow-up:  Pharmacist 03/21/2023 - BMET at next appointment PCP clinic visit in PRN Patient seen with Lendon Ka, PharmD Candidate and Shona Simpson, PharmD Candidate

## 2023-03-14 NOTE — Assessment & Plan Note (Signed)
ASCVD risk - primary prevention in patient with diabetes. Last LDL is 256 not at goal of <69 mg/dL. ASCVD risk factors include T2DM, HTN, HLD, CAD. high intensity statin indicated. Recent LDL may have been impacted by patient non adherence, however, adherence to medication regimen is not enough to get patient to goal LDL range. Poor patient literacy warranted not adding additional LDL lowering medication at this appointment, as to not confuse patient.  -Continued rosuvastatin 40 mg once daily -Consider adding ezetimibe (Zetia) 10 mg once daily at future appointment once HTN/T2DM have improved control

## 2023-03-14 NOTE — Patient Instructions (Addendum)
It was nice to see you today!  Your goal blood pressure is <130/80 mmHg.  Medication Changes: START eplerenone 25 mg (one tablet) one time daily  TAKE 45 units of insulin Basaglar (grey pen) one time daily TAKE 24 units of insulin Fiasp (blue pen) with each meal of the day (48 units total in the day  Take 1 tablet of metformin twice daily Take 1 tablet of carvedilol twice daily  BRING ALL MEDICATIONS AND 2 TIMES DAILY PILL BOX TO APPOINTMENT NEXT WEEK   Continue all other medication the same.   CHECK YOUR BLOOD SUGAR 5 TIMES OVER THE NEXT WEEK - CHECK AT DIFFERENT TIMES OF THE DAY - BEFORE AND AFTER MEALS   Monitor blood pressure at home daily and keep a log (on your phone or piece of paper) to bring with you to your next visit. Write down date, time, blood pressure and pulse.  Keep up the good work with diet and exercise. Aim for a diet full of vegetables, fruit and lean meats (chicken, [place], fish). Try to limit salt intake by eating fresh or frozen vegetables (instead of canned), rinse canned vegetables prior to cooking and do not add any additional salt to meals.

## 2023-03-14 NOTE — Assessment & Plan Note (Signed)
Diabetes longstanding currently uncontrolled. Patient is able to verbalize appropriate hypoglycemia management plan. Medication adherence appears poor, patient confused as to what medications are for what disease and some confusion on how many times daily he takes his medications. Reports that he has a pill box that his wife fills. Control is suboptimal due to poor medication adherence/literacy resulting in suboptimal medication regimen. -Increased insulin Basaglar (glargine) from patient reported 40 units daily to 45 units once daily (as prescribed on 10/29) -Increased insulin Fiasp (aspart) from patient reported 20 units twice daily with meals to 24 units twice daily with meals -Continued metformin 500 mg BID  -Consider at next appointment increasing metformin to 1000 mg BID  -Encouraged checking blood sugar ~5x before next appointment, varying times, to see better variety of BG readings

## 2023-03-17 NOTE — Progress Notes (Signed)
Reviewed and agree with Dr Koval's plan.   

## 2023-03-21 ENCOUNTER — Ambulatory Visit: Payer: Medicare HMO | Admitting: Pharmacist

## 2023-04-11 ENCOUNTER — Telehealth: Payer: Self-pay

## 2023-04-11 NOTE — Telephone Encounter (Signed)
Informed patients wife that 2 boxes of basaglar are ready for pickup here at the office. Looks like order is from October.   Let her know I will place a [facility] renewal application in the bag as well to return to office.

## 2023-04-17 NOTE — Telephone Encounter (Signed)
Patient presents to clinic to pick up medication.   Provided with medication shipment per note from [place].   Veronda Prude, RN

## 2023-05-02 ENCOUNTER — Telehealth: Payer: Self-pay

## 2023-05-02 DIAGNOSIS — E119 Type 2 diabetes mellitus without complications: Secondary | ICD-10-CM

## 2023-05-02 NOTE — Progress Notes (Deleted)
 Pharmacy Medication Assistance Program Note    05/09/2023  Patient ID: [name], male   DOB: Jul 27, 1950, 73 y.o.   MRN: [id number]     05/02/2023  [employer]reach [employer]  Manufacturer Medication [employer] Retail Buyer Drugs Basaglar   Dose of Basaglar  U100 KWIKPEN  Type of Radiographer, Therapeutic Assistance  Date Application Sent to Prescriber 05/02/2023  Name of Prescriber Damien Pinal  Date Application Submitted to Manufacturer 05/09/2023  Method Application Sent to Manufacturer Fax    Renewal submitted. Please send a new 90 day (with refills) RX of Basaglar  to [facility] for renewal.

## 2023-05-04 ENCOUNTER — Ambulatory Visit: Payer: Medicare HMO

## 2023-05-08 ENCOUNTER — Telehealth: Payer: Self-pay | Admitting: Pharmacist

## 2023-05-08 NOTE — Telephone Encounter (Signed)
 Attempted to contact patient for follow-up of glycemic control.    No voice mail set-up  Total time with patient call and documentation of interaction: 3 minutes.  Follow-up phone call planned: 1 week

## 2023-05-10 MED ORDER — BASAGLAR KWIKPEN 100 UNIT/ML ~~LOC~~ SOPN: 45.0000 [IU] | PEN_INJECTOR | Freq: Every day | SUBCUTANEOUS | 2 refills | Status: DC

## 2023-06-01 ENCOUNTER — Ambulatory Visit: Payer: Medicare HMO

## 2023-06-01 NOTE — Telephone Encounter (Signed)
 Faxed. 2nd attempt

## 2023-06-05 NOTE — Progress Notes (Signed)
 Pharmacy Medication Assistance Program Note    06/05/2023  Patient ID: Charlston Kluz, male   DOB: June 01, 1950, 73 y.o.   MRN: [id number]     05/02/2023  [employer]  Manufacturer Medication One Retail buyer Drugs Basaglar   Dose of Basaglar  U100 [employer]  Type of Sport and exercise psychologist  Date Application Sent to Prescriber 05/02/2023  Name of Prescriber Clem Currier  Date Application Submitted to Manufacturer 05/09/2023  Method Application Sent to Manufacturer Fax  Patient Assistance Determination Approved  Approval Start Date 06/01/2023  Approval End Date 04/24/2024

## 2023-06-06 ENCOUNTER — Ambulatory Visit: Payer: Medicare HMO | Admitting: Pharmacist

## 2023-06-12 DIAGNOSIS — H52223 Regular astigmatism, bilateral: Secondary | ICD-10-CM | POA: Diagnosis not present

## 2023-06-12 LAB — HM DIABETES EYE EXAM

## 2023-06-17 DIAGNOSIS — H52223 Regular astigmatism, bilateral: Secondary | ICD-10-CM | POA: Diagnosis not present

## 2023-06-17 DIAGNOSIS — H524 Presbyopia: Secondary | ICD-10-CM | POA: Diagnosis not present

## 2023-06-17 DIAGNOSIS — Z0100 Encounter for examination of eyes and vision without abnormal findings: Secondary | ICD-10-CM | POA: Diagnosis not present

## 2023-06-17 DIAGNOSIS — H5203 Hypermetropia, bilateral: Secondary | ICD-10-CM | POA: Diagnosis not present

## 2023-06-20 ENCOUNTER — Encounter: Payer: Self-pay | Admitting: Pharmacist

## 2023-06-20 ENCOUNTER — Ambulatory Visit (INDEPENDENT_AMBULATORY_CARE_PROVIDER_SITE_OTHER): Payer: Medicare HMO | Admitting: Pharmacist

## 2023-06-20 VITALS — BP 145/80 | HR 81 | Wt 213.0 lb

## 2023-06-20 DIAGNOSIS — E119 Type 2 diabetes mellitus without complications: Secondary | ICD-10-CM

## 2023-06-20 DIAGNOSIS — I10 Essential (primary) hypertension: Secondary | ICD-10-CM

## 2023-06-20 MED ORDER — EPLERENONE 25 MG PO TABS
25.0000 mg | ORAL_TABLET | Freq: Every day | ORAL | 11 refills | Status: DC
Start: 2023-06-20 — End: 2023-10-13

## 2023-06-20 MED ORDER — BASAGLAR KWIKPEN 100 UNIT/ML ~~LOC~~ SOPN
65.0000 [IU] | PEN_INJECTOR | Freq: Every day | SUBCUTANEOUS | 2 refills | Status: DC
Start: 2023-06-20 — End: 2023-06-22

## 2023-06-20 NOTE — Patient Instructions (Addendum)
 It was nice to see you today!  Your goal blood sugar is 80-130 before eating and less than 180 after eating.  Medication Changes: START eplerenone (Inspra) 25 mg daily  Increase insulin glargine (Basaglar KwikPen) from 45 units to 65 units daily.  Continue all other medication the same.   Please bring all of your medications to your next office visit.   Monitor blood sugars at home and keep a log (glucometer or piece of paper) to bring with you to your next visit.  Keep up the good work with diet and exercise. Aim for a diet full of vegetables, fruit and lean meats (chicken, [place], fish). Try to limit salt intake by eating fresh or frozen vegetables (instead of canned), rinse canned vegetables prior to cooking and do not add any additional salt to meals.

## 2023-06-20 NOTE — Progress Notes (Signed)
 S:     Chief Complaint  Patient presents with   Medication Management    DM & HTN management   73 y.o. male who presents for diabetes evaluation, education, and management. Patient arrives in good spirits and presents without any assistance.   Patient is a poor historian, but was able to call wife to confirm the medications he was taking.  Patient was referred and last seen by Primary Care Provider, Dr. Jena Gauss, on 03/03/23.   PMH is significant for T2DM, HTN, HLD, HFmrEF, CAD.  At last visit, increased insulin glargine (Basaglar) from 40 units to 45 units daily, increased insulin aspart (Fiasp) from 20 units to 24 units BID w/ meals, added aplerenone (Inspra) 25 mg daily.   Patient reports Diabetes was diagnosed in 2019.   Family/Social History: Mother (T2DM), sister (T2DM)  Current diabetes medications include: insulin aspart (Fiasp flextouch) 24 units BID AC (patient taking 45 units once daily prior to large evening meal),  insulin glargine (Basaglar KwikPen) 45 units daily at ~ 11 AM, metformin 500 mg BID Current hypertension medications include: amlodipine (Norvasc) 10 mg daily, carvedilol (Coreg) 25 mg BID,   Patient reports adherence to taking all medications as prescribed. Patient has not started taking eplerenone (Inspra) 25 mg daily.   Do you feel that your medications are working for you? yes Have you been experiencing any side effects to the medications prescribed? no Do you have any problems obtaining medications due to transportation or finances? no Insurance coverage: [facility] Medicare  Patient denies hypoglycemic events.   Patient reports nocturia (nighttime urination). 2-3x/night  Patient reports neuropathy (nerve pain). Both feet (tingling all day) Patient denies visual changes. Patient reports self foot exams.     Patient reported dietary habits: Eats 2 meals/day Breakfast: eggs, toast, or bowl of cereal Lunch: does not eat Dinner: biggest meal Drinks:  water, no-sugar packets crystal-lite  Patient-reported exercise habits: cuts grass (~5-6 yards)   O:   Review of Systems  Neurological:  Positive for tingling (both feet).  All other systems reviewed and are negative.   Physical Exam Constitutional:      Appearance: Normal appearance.  Pulmonary:     Effort: Pulmonary effort is normal.  Neurological:     Mental Status: He is alert.  Psychiatric:        Mood and Affect: Mood normal.        Behavior: Behavior normal.     Libre3 CGM Download today Mar 07 2023 - Mar 20 2023 % Time CGM is active: 73% Average Glucose: 321 mg/dL Glucose Management Indicator: 11.0  Glucose Variability: 21.9% (goal <36%) Time in Goal:  - Time in range 70-180: 6% - Time above range: 94% - Time below range: 0% Observed patterns:   Lab Results  Component Value Date   HGBA1C 12.9 (A) 03/01/2023   Vitals:   06/20/23 1620 06/20/23 1623  BP: (!) 154/88 (!) 145/80  Pulse: 81   SpO2: 100%     Lipid Panel     Component Value Date/Time   CHOL 365 (H) 03/01/2023 1119   TRIG 184 (H) 03/01/2023 1119   HDL 73 03/01/2023 1119   CHOLHDL 5.0 03/01/2023 1119   CHOLHDL 3.1 01/23/2022 0430   VLDL 37 01/23/2022 0430   LDLCALC 256 (H) 03/01/2023 1119   LDLDIRECT 56 09/10/2020 0947     A/P: Diabetes longstanding  currently uncontrolled. Patient is able to verbalize appropriate hypoglycemia management plan. Medication adherence appears good. Control is suboptimal due  to GMI above goal TIR at 6%.  High level insulin resistance suspected.  -Increased dose of basal insulinBasaglar (insulin glargine) from 45 units to 65 units daily in the morning.  -Continued rapid insulin Fiasp FlexTouch (insulin aspart) 24 units BID before meals.  -Continued metformin 500 mg BID.  -Patient educated on purpose, proper use, and potential adverse effects.  -Extensively discussed pathophysiology of diabetes, recommended lifestyle interventions, dietary effects on blood  sugar control.  -Counseled on s/sx of and management of hypoglycemia.  -Next A1c anticipated at next office visit.   ASCVD risk - primary prevention in patient with diabetes. Last LDL is not at goal of <70  mg/dL. ASCVD risk factors include uncontrolled DM.  -Continued rosuvastatin 40 mg.   Hypertension longstanding currently uncontrolled. Blood pressure goal of <130/80 mmHg. Medication adherence good. Blood pressure control is suboptimal due to BP above goal. -Continued amlodipine (Norvasc) 10 mg, carvedilol (Coreg) 25 mg BID, valsartan-hydrochlorothiazide 320-12.5 mg daily -Restarted eplerenone (Inspra) 25 mg daily   Written patient instructions provided. Patient verbalized understanding of treatment plan.  Total time in face to face counseling 31 minutes.    Follow-up:  Pharmacist 06/29/23 PCP clinic visit in TBD Patient seen with Pearletha Forge, PharmD Candidate.   Note - contacted pharmacy to clarify we are aware of interaction with ARB and are monitoring electrolytes.

## 2023-06-20 NOTE — Assessment & Plan Note (Signed)
 Diabetes longstanding  currently uncontrolled. Patient is able to verbalize appropriate hypoglycemia management plan. Medication adherence appears good. Control is suboptimal due to GMI above goal TIR at 6%.  High level insulin resistance suspected.  -Increased dose of basal insulinBasaglar (insulin glargine) from 45 units to 65 units daily in the morning.  -Continued rapid insulin Fiasp FlexTouch (insulin aspart) 24 units BID before meals.  -Continued metformin 500 mg BID.  -Patient educated on purpose, proper use, and potential adverse effects.  -Extensively discussed pathophysiology of diabetes, recommended lifestyle interventions, dietary effects on blood sugar control.  -Counseled on s/sx of and management of hypoglycemia.  -Next A1c anticipated at next office visit.

## 2023-06-20 NOTE — Assessment & Plan Note (Signed)
 Hypertension longstanding currently uncontrolled. Blood pressure goal of <130/80 mmHg. Medication adherence good. Blood pressure control is suboptimal due to BP above goal. -Continued amlodipine (Norvasc) 10 mg, carvedilol (Coreg) 25 mg BID, valsartan-hydrochlorothiazide 320-12.5 mg daily -Restarted eplerenone (Inspra) 25 mg daily

## 2023-06-21 ENCOUNTER — Telehealth: Payer: Self-pay

## 2023-06-21 NOTE — Telephone Encounter (Signed)
 Pharmacy Patient Advocate Encounter  Received notification from [facility] that Prior Authorization for Eplerenone  has been APPROVED from 06/21/23 to 04/23/24   PA #/Case ID/Reference #: [id number]

## 2023-06-21 NOTE — Telephone Encounter (Signed)
 Pharmacy Patient Advocate Encounter   Received notification from CoverMyMeds that prior authorization for Eplerenone  is required/requested.   Insurance verification completed.   The patient is insured through [employer] .   PA required; PA submitted to above mentioned insurance via CoverMyMeds Key/confirmation #/EOC [id number]. Status is pending

## 2023-06-22 ENCOUNTER — Telehealth: Payer: Self-pay

## 2023-06-22 DIAGNOSIS — E119 Type 2 diabetes mellitus without complications: Secondary | ICD-10-CM

## 2023-06-22 MED ORDER — BASAGLAR KWIKPEN 100 UNIT/ML ~~LOC~~ SOPN: 65.0000 [IU] | PEN_INJECTOR | Freq: Every day | SUBCUTANEOUS | 2 refills | Status: DC

## 2023-06-22 NOTE — Progress Notes (Signed)
 Reviewed and agree with Dr Macky Lower plan.

## 2023-06-22 NOTE — Telephone Encounter (Signed)
 Patients wife calls nurse line in regards to [facility] prescription.   She reports he gets his Hospital doctor from [employer] and not [employer].   Will cancel at [facility] and resend to [facility].

## 2023-06-29 ENCOUNTER — Ambulatory Visit: Payer: Medicare HMO | Admitting: Pharmacist

## 2023-07-11 ENCOUNTER — Telehealth: Payer: Self-pay

## 2023-07-13 NOTE — Telephone Encounter (Signed)
 Contacted patient's phone in attempt to reschedule failed attempt to meet with patient today when he came to the office.   He did pick-up medication.   Patient's spouse will plan to have him reschedule.

## 2023-07-13 NOTE — Telephone Encounter (Signed)
 Medication shipment picked up by patient.

## 2023-07-21 ENCOUNTER — Ambulatory Visit (INDEPENDENT_AMBULATORY_CARE_PROVIDER_SITE_OTHER): Admitting: Student

## 2023-07-21 VITALS — BP 165/70 | Temp 98.6°F | Wt 210.0 lb

## 2023-07-21 DIAGNOSIS — I10 Essential (primary) hypertension: Secondary | ICD-10-CM | POA: Diagnosis not present

## 2023-07-21 DIAGNOSIS — R208 Other disturbances of skin sensation: Secondary | ICD-10-CM | POA: Diagnosis not present

## 2023-07-21 NOTE — Progress Notes (Signed)
    SUBJECTIVE:   CHIEF COMPLAINT / HPI: Hot feet  Discussed the use of AI scribe software for clinical note transcription with the patient, who gave verbal consent to proceed.  History of Present Illness The patient, with a history of diabetes, presents with a week-long complaint of bilateral foot heat, particularly at night. The sensation is described as intense, akin to "walking on coal," but without associated pain, burning, tingling, numbness, or sweating. The symptom is relieved by removing the feet from under the covers. The patient denies any recent changes in medication, except for the initiation of eplerenone about a week ago. However, the patient reports that the symptom started a day or two before the initiation of this medication. The patient also reports a history of high blood pressure and is on many medications-did not take all this AM.   PERTINENT  PMH / PSH: T2DM, HTN, CAD s/p stent  OBJECTIVE:   BP (!) 165/70   Temp 98.6 F (37 C)   Wt 210 lb (95.3 kg)   SpO2 97%   BMI 34.95 kg/m   General: Well appearing, NAD, awake, alert, responsive to questions Head: Normocephalic atraumatic Respiratory:  chest rises symmetrically,  no increased work of breathing Extremities: Moves upper and lower extremities freely, bilateral feet with 2+ pulses, scaly dry skin, no ulcers, no tenderness to palpation  ASSESSMENT/PLAN:   Assessment & Plan Burning sensation of foot Bilateral foot discomfort with heat sensation would be unusual side effect of eplerenone. Differential includes peripheral neuropathy (high risk) or restless leg syndrome. - BMP - hemoglobin A1c. - ferritin and vitamin B12 levels. - thyroid function tests. - CBC - Advise application of Eucerin cream to feet. Essential hypertension, benign BP uncontrolled. - Advised follow-up in two weeks with medication bottles if blood pressure remains elevated   Levin Erp, MD [facility]

## 2023-07-21 NOTE — Assessment & Plan Note (Addendum)
 BP uncontrolled. - Advised follow-up in two weeks with medication bottles if blood pressure remains elevated

## 2023-07-21 NOTE — Patient Instructions (Signed)
 It was great to see you! Thank you for allowing me to participate in your care!   Our plans for today:  - We will get an A1c - We will get labs to check why you have this foot feeling - Rechecking your BP today, if still elevated you need a 2 week follow up  Take care and seek immediate care sooner if you develop any concerns.  Levin Erp, MD

## 2023-07-24 ENCOUNTER — Other Ambulatory Visit

## 2023-07-24 ENCOUNTER — Telehealth: Payer: Self-pay

## 2023-07-24 DIAGNOSIS — R208 Other disturbances of skin sensation: Secondary | ICD-10-CM

## 2023-07-24 LAB — POCT GLYCOSYLATED HEMOGLOBIN (HGB A1C): HbA1c POC (<> result, manual entry): >15 % (ref 4.0–5.6)

## 2023-07-24 NOTE — Telephone Encounter (Signed)
 Called patient per Dr. Rondel Baton request to schedule an appointment with Dr. Raymondo Band.  Was able to schedule patient [name], [name] at 4pm  Provided patient appointment information.  Drusilla Kanner, CMA

## 2023-07-24 NOTE — Telephone Encounter (Signed)
-----   Message from Glendale Chard sent at 07/24/2023 12:04 PM EDT ----- Hi team, Can you please schedule this patient to be seen with Dr. Raymondo Band as soon as he has availability.  Thanks,  Glendale Chard, DO [facility], PGY-2 07/24/23 12:05 PM ----- Message ----- From: Jennette Bill, CMA Sent: 07/24/2023  11:48 AM EDT To: Glendale Chard, DO

## 2023-07-25 ENCOUNTER — Telehealth: Payer: Self-pay | Admitting: Student

## 2023-07-25 ENCOUNTER — Ambulatory Visit (INDEPENDENT_AMBULATORY_CARE_PROVIDER_SITE_OTHER): Admitting: Pharmacist

## 2023-07-25 ENCOUNTER — Encounter: Payer: Self-pay | Admitting: Student

## 2023-07-25 ENCOUNTER — Encounter: Payer: Self-pay | Admitting: Pharmacist

## 2023-07-25 VITALS — BP 169/100 | HR 87 | Wt 209.8 lb

## 2023-07-25 DIAGNOSIS — E119 Type 2 diabetes mellitus without complications: Secondary | ICD-10-CM | POA: Diagnosis not present

## 2023-07-25 DIAGNOSIS — Z794 Long term (current) use of insulin: Secondary | ICD-10-CM

## 2023-07-25 DIAGNOSIS — I10 Essential (primary) hypertension: Secondary | ICD-10-CM | POA: Diagnosis not present

## 2023-07-25 LAB — BASIC METABOLIC PANEL WITH GFR
BUN/Creatinine Ratio: 13 (ref 10–24)
BUN: 22 mg/dL (ref 8–27)
CO2: 27 mmol/L (ref 20–29)
Calcium: 9.2 mg/dL (ref 8.6–10.2)
Chloride: 101 mmol/L (ref 96–106)
Creatinine, Ser: 1.68 mg/dL — ABNORMAL HIGH (ref 0.76–1.27)
Glucose: 308 mg/dL — ABNORMAL HIGH (ref 70–99)
Potassium: 4.4 mmol/L (ref 3.5–5.2)
Sodium: 140 mmol/L (ref 134–144)
eGFR: 43 mL/min/{1.73_m2} — ABNORMAL LOW (ref >59)

## 2023-07-25 LAB — CBC
Hematocrit: 39.6 % (ref 37.5–51.0)
Hemoglobin: 13 g/dL (ref 13.0–17.7)
MCH: 28.9 pg (ref 26.6–33.0)
MCHC: 32.8 g/dL (ref 31.5–35.7)
MCV: 88 fL (ref 79–97)
Platelets: 280 10*3/uL (ref 150–450)
RBC: 4.5 x10E6/uL (ref 4.14–5.80)
RDW: 11.6 % (ref 11.6–15.4)
WBC: 8.4 10*3/uL (ref 3.4–10.8)

## 2023-07-25 LAB — TSH RFX ON ABNORMAL TO FREE T4: TSH: 3.03 u[IU]/mL (ref 0.450–4.500)

## 2023-07-25 LAB — FERRITIN: Ferritin: 106 ng/mL (ref 30–400)

## 2023-07-25 LAB — VITAMIN B12: Vitamin B-12: 790 pg/mL (ref 232–1245)

## 2023-07-25 MED ORDER — BASAGLAR KWIKPEN 100 UNIT/ML ~~LOC~~ SOPN
65.0000 [IU] | PEN_INJECTOR | Freq: Every day | SUBCUTANEOUS | Status: DC
Start: 2023-07-25 — End: 2023-08-21

## 2023-07-25 MED ORDER — FIASP FLEXTOUCH 100 UNIT/ML ~~LOC~~ SOPN: 40.0000 [IU] | PEN_INJECTOR | Freq: Every day | SUBCUTANEOUS | Status: DC

## 2023-07-25 NOTE — Progress Notes (Signed)
 S:     Chief Complaint  Patient presents with   Medication Management    Diabetes - Medication Update - Review   73 y.o. male who presents for diabetes evaluation, education, and management. Patient arrives in good spirits and presents without any assistance.   Patient was referred and last seen by Primary Care Provider, Dr. Jena Gauss, on 03/03/2023.    PMH is significant for non-adherence, resistant hypertension, hyperlipidemia and diabetes.  At last visit, with pharmacist, insulin therapy was adjusted.     Family/Social History: wife assists with pill box fill BUT patient admits he is non-adherent with medications.   Current diabetes medications include: Basaglar (insulin glargine), Fiasp (insulin aspart) Current hypertension medications include: multiple therapies prescribed, patient admits non-adherence Current hyperlipidemia medications include: rosuvastatin  Patient denies adherence with medications, reports missing multiple medications multiple times per week, on average.  Patient reports perpetual hyperglycemic (orange) readings on his CGM.  CGM  glucose average > 300 over the last 90 days  Patient reports neuropathy (nerve pain) bilateral feet at night.    Patient-reported exercise habits: starting to cut grass for multiple yards in neighborhood.   O:   Review of Systems  Musculoskeletal:  Positive for joint pain.  Neurological:  Positive for sensory change.  All other systems reviewed and are negative.   Physical Exam Vitals reviewed.  Constitutional:      Appearance: Normal appearance.  Pulmonary:     Effort: Pulmonary effort is normal.  Neurological:     Mental Status: He is alert.  Psychiatric:        Mood and Affect: Mood normal.    Lab Results  Component Value Date   HGBA1C >15.0 07/24/2023   Vitals:   07/25/23 1620 07/25/23 1625  BP: (!) 177/91 (!) 169/100  Pulse: 87   SpO2: 99%     Lipid Panel     Component Value Date/Time   CHOL  365 (H) 03/01/2023 1119   TRIG 184 (H) 03/01/2023 1119   HDL 73 03/01/2023 1119   CHOLHDL 5.0 03/01/2023 1119   CHOLHDL 3.1 01/23/2022 0430   VLDL 37 01/23/2022 0430   LDLCALC 256 (H) 03/01/2023 1119   LDLDIRECT 56 09/10/2020 0947    Clinical Atherosclerotic Cardiovascular Disease (ASCVD): Yes  The ASCVD Risk score (Arnett DK, et al., 2019) failed to calculate for the following reasons:   The valid total cholesterol range is 130 to 320 mg/dL    A/P: Diabetes longstanding and currently remains poorly controlled most likely due to non-adherence with therapy.  Reinforced need for adherence with insulin therapy.  -Encouraged to take previously prescribed basal insulin Basaglar (insulin glargine) 65 units once daily.  -Adjusted rapid insulin Fiasp (insulin aspart) to 40 units prior to one large evening meal DAILY at 40 units.   -Continued metformin 500mg  BID  -Extensively discussed pathophysiology of diabetes, recommended lifestyle interventions, dietary effects on blood sugar control.   Hypertension longstanding and currently appears non-adherent with multiple blood pressure agents. Blood pressure goal of <130/80 mmHg. Medication adherence problematic.  -Suggested patient to be more adherent with previously prescribed medications therapies that he has available at home to improve blood pressure control.  Following discussion, patient verbalized understanding of goals and need to improve medication adherence.   Written patient instructions provided. Patient verbalized understanding of treatment plan.  Total time in face to face counseling 44 minutes.    Follow-up:  Pharmacist 2 weeks PCP clinic visit in TBD Patient seen with Darolyn Rua,  PharmD Candidate.

## 2023-07-25 NOTE — Patient Instructions (Signed)
 It was nice to see you today!  PLEASE bring all of your medications to your next visit.    Medication Changes: RE - START blood pressure pills as prescribed.  Take Basaglar once daily in the morning - 65 units  Take Fiasp once daily prior to evening meal - 40 units  Continue all other medication the same.    Keep up the good work with diet and exercise. Aim for a diet full of vegetables, fruit and lean meats (chicken, [place], fish). Try to limit salt intake by eating fresh or frozen vegetables (instead of canned), rinse canned vegetables prior to cooking and do not add any additional salt to meals.

## 2023-07-25 NOTE — Assessment & Plan Note (Signed)
 Diabetes longstanding and currently remains poorly controlled most likely due to non-adherence with therapy.  Reinforced need for adherence with insulin therapy.  -Encouraged to take previously prescribed basal insulin Basaglar (insulin glargine) 65 units once daily.  -Adjusted rapid insulin Fiasp (insulin aspart) to 40 units prior to one large evening meal DAILY at 40 units.   -Continued metformin 500mg  BID  -Extensively discussed pathophysiology of diabetes, recommended lifestyle interventions, dietary effects on blood sugar control.

## 2023-07-25 NOTE — Telephone Encounter (Signed)
 Called x 3 no answer. Left VM to check mychart about lab results. He is scheduled to see Dr. Raymondo Band today as well.

## 2023-07-25 NOTE — Assessment & Plan Note (Signed)
 Hypertension longstanding and currently appears non-adherent with multiple blood pressure agents. Blood pressure goal of <130/80 mmHg. Medication adherence problematic.  -Suggested patient to be more adherent with previously prescribed medications therapies that he has available at home to improve blood pressure control.  Following discussion, patient verbalized understanding of goals and need to improve medication adherence.

## 2023-07-27 NOTE — Progress Notes (Signed)
 Reviewed and agree with Dr Macky Lower plan.

## 2023-08-10 ENCOUNTER — Encounter: Payer: Self-pay | Admitting: Pharmacist

## 2023-08-10 ENCOUNTER — Ambulatory Visit (INDEPENDENT_AMBULATORY_CARE_PROVIDER_SITE_OTHER): Admitting: Pharmacist

## 2023-08-10 VITALS — BP 171/97 | HR 92 | Wt 217.2 lb

## 2023-08-10 DIAGNOSIS — I10 Essential (primary) hypertension: Secondary | ICD-10-CM

## 2023-08-10 DIAGNOSIS — E119 Type 2 diabetes mellitus without complications: Secondary | ICD-10-CM

## 2023-08-10 DIAGNOSIS — Z794 Long term (current) use of insulin: Secondary | ICD-10-CM

## 2023-08-10 DIAGNOSIS — E782 Mixed hyperlipidemia: Secondary | ICD-10-CM

## 2023-08-10 NOTE — Patient Instructions (Addendum)
 It was nice to see you today!  Your goal blood sugar is 80-130 before eating and less than 180 after eating.  Freestyle Libre 3 Login  Email: [email or web address]  Password: [email or web address]   Medication Changes:  Continue all medications the same.   Keep up the good work with diet and exercise. Aim for a diet full of vegetables, fruit and lean meats (chicken, [place], fish). Try to limit salt intake by eating fresh or frozen vegetables (instead of canned), rinse canned vegetables prior to cooking and do not add any additional salt to meals.

## 2023-08-10 NOTE — Progress Notes (Signed)
 S:     Chief Complaint  Patient presents with   Medication Management    Diabetes - Hypertension    73 y.o. male who presents for diabetes evaluation, education, and management. Patient arrives in good spirits and presents without any assistance.   Patient was referred and last seen by Primary Care Provider, Dr. Laroy Apple, on 07/21/2023. At last visit, patient was seen for burning sensation in feet and was shown to have uncontrolled blood pressure.   PMH is significant for T2DM, CAD, HTN, and HLD.   Patient reports Diabetes was diagnosed in 2019.   Current diabetes medications include: metformin Current hypertension medications include: valsartan-hydrochlorothiazide 320-12.5 mg, eplerenone 25 mg, carvedilol 25 mg BID, amlodipine 10 mg Current hyperlipidemia medications include: rosuvastatin  Patient denies adherence with medications, reports missing metformin, carvedilol and aspirin medications multiple times per week, on average. Spent majority of visit discussing and readjusting Libre 3 App and sensor connection including password reset.  - Forgets to take metformin & carvedilol in the mornings - Reports taking oral medications only at night  Do you feel that your medications are working for you? yes Have you been experiencing any side effects to the medications prescribed? Yes - patient described "burning chest sensation" a few days after beginning eplerenone. Reports that he has noticed this symptom mostly on days when he has exerted himself including bending over. Once he sits down, burning sensation goes away after ~5 minutes.  Denies any pain at the time of appointment today.  Given history, patient agreed to follow-up with PCP early next week.    Do you have any problems obtaining medications due to transportation or finances? no Insurance coverage: [employer]  Patient denies hypoglycemic events.  Reported 2 hour post-meal/random blood sugars: ~140s  Patient denies  nocturia (nighttime urination).  Patient denies neuropathy (nerve pain). Patient denies visual changes. Patient reports self foot exams.   Patient reported dietary habits: Eats 1 meals/day Breakfast: sometimes eggs Lunch:  Dinner: meatloaf, chicken, mac & cheese (small amount), vegetables Snacks: N/A Drinks: orange juice (small amount), water  Patient-reported exercise habits: Walking with mowing lawns   O:  Libre3 CGM unable to be downloaded today due to app connection issues.   Lab Results  Component Value Date   HGBA1C >15.0 07/24/2023   Vitals:   08/10/23 1704 08/10/23 1705  BP: (!) 177/96 (!) 171/97  Pulse: 92   SpO2: 98%     Lipid Panel     Component Value Date/Time   CHOL 365 (H) 03/01/2023 1119   TRIG 184 (H) 03/01/2023 1119   HDL 73 03/01/2023 1119   CHOLHDL 5.0 03/01/2023 1119   CHOLHDL 3.1 01/23/2022 0430   VLDL 37 01/23/2022 0430   LDLCALC 256 (H) 03/01/2023 1119   LDLDIRECT 56 09/10/2020 0947    Clinical Atherosclerotic Cardiovascular Disease (ASCVD): Yes  The ASCVD Risk score (Arnett DK, et al., 2019) failed to calculate for the following reasons:   The valid total cholesterol range is 130 to 320 mg/dL   Patient is participating in a Managed Medicaid Plan:  Yes   A/P: Diabetes longstanding currently uncontrolled with most recent A1c >15. However, patient reports that recently, has been seeing ~140s during the day when checking [name] app, usually checks once daily in the afternoon. Medication adherence appears to be improved since last appointment.  -Continued basal insulin Basaglar (insulin glargine) at 65 units daily in the morning.  -Continued rapid insulin Novolog (insulin aspart) at 40 units with  supper.  -Continued metformin 500 mg 2 tablets twice daily.  -Extensively discussed pathophysiology of diabetes, recommended lifestyle interventions, dietary effects on blood sugar control.  -Counseled on s/sx of and management of hypoglycemia.  -Next  A1c anticipated June 2025.   ASCVD risk - secondary prevention in patient with diabetes. Last LDL is 256 not at goal of <40 mg/dL. ASCVD risk factors include T2DM, stent placement, HTN, HLD and CAD. High intensity statin indicated.  -Continued rosuvastatin 40 mg.   Hypertension longstanding currently uncontrolled with office BP of 177/96 on valsartan-hydrochlorothiazide 320-12.5 mg, eplerenone 25 mg, carvedilol 25 mg BID, and amlodipine 10 mg. Blood pressure goal of <130/80 mmHg. Admits medication non-adherence. Blood pressure control is suboptimal due to adherence issues, patient admits he forgets to take medications and skips AM carvedilol routinely. -Continued valsartan-hydrochlorothiazide 320-12.5 mg, eplerenone 25 mg, carvedilol 25 mg BID, and amlodipine 10 mg. -Counseled on importance of adherence and provided TWICE daily pillbox to help with adherence. -Patient verbalized need to improve adherence and agreed to follow-up plan.  Written patient instructions provided. Patient verbalized understanding of treatment plan.  Total time in face to face counseling 58 minutes.    Follow-up:  Pharmacist on 08/29/2023 PCP clinic visit on 08/15/2023 Patient seen with Jeanine Millers, PharmD Candidate.

## 2023-08-10 NOTE — Assessment & Plan Note (Addendum)
 Diabetes longstanding currently uncontrolled with most recent A1c >15. However, patient reports that recently, has been seeing ~140s during the day when checking app, usually checks once daily in the afternoon. Medication adherence appears to be improved since last appointment.  -Continued basal insulin Basaglar (insulin glargine) at 65 units daily in the morning.  -Continued rapid insulin Novolog (insulin aspart) at 40 units with supper.  -Continued metformin 500 mg 2 tablets twice daily.  -Extensively discussed pathophysiology of diabetes, recommended lifestyle interventions, dietary effects on blood sugar control.  -Counseled on s/sx of and management of hypoglycemia.

## 2023-08-10 NOTE — Assessment & Plan Note (Signed)
 ASCVD risk - secondary prevention in patient with diabetes. Last LDL is 256 not at goal of <86 mg/dL. ASCVD risk factors include T2DM, stent placement, HTN, HLD and CAD. High intensity statin indicated.  -Continued rosuvastatin 40 mg.

## 2023-08-10 NOTE — Assessment & Plan Note (Signed)
 Hypertension longstanding currently uncontrolled with office BP of 177/96 on valsartan-hydrochlorothiazide 320-12.5 mg, eplerenone 25 mg, carvedilol 25 mg BID, and amlodipine 10 mg. Blood pressure goal of <130/80 mmHg. Medication adherence is nonadherent. Blood pressure control is suboptimal due to adherence issues, patient admits he forgets to take medications. -Continued valsartan-hydrochlorothiazide 320-12.5 mg, eplerenone 25 mg, carvedilol 25 mg BID, and amlodipine 10 mg. -Counseled on importance of adherence and provided pillbox to help with adherence.

## 2023-08-11 NOTE — Progress Notes (Signed)
 Reviewed and agree with Dr Macky Lower plan.

## 2023-08-15 ENCOUNTER — Encounter: Payer: Self-pay | Admitting: Student

## 2023-08-15 ENCOUNTER — Ambulatory Visit (INDEPENDENT_AMBULATORY_CARE_PROVIDER_SITE_OTHER): Admitting: Student

## 2023-08-15 VITALS — BP 184/102 | HR 89 | Ht 64.0 in | Wt 218.0 lb

## 2023-08-15 DIAGNOSIS — I2089 Other forms of angina pectoris: Secondary | ICD-10-CM | POA: Diagnosis not present

## 2023-08-15 MED ORDER — NITROGLYCERIN 0.3 MG SL SUBL: 0.3000 mg | SUBLINGUAL_TABLET | SUBLINGUAL | 12 refills | Status: DC | PRN

## 2023-08-15 NOTE — Patient Instructions (Signed)
 I am sending a referral to cardiology.  You need to be seen in 1 week.  Please go home and take your blood pressure medications.  We will need you to come back and be seen in 1 week for blood pressure recheck

## 2023-08-15 NOTE — Progress Notes (Signed)
    SUBJECTIVE:   CHIEF COMPLAINT / HPI:   [name] is a 73 y.o. male presenting for chest burning sensation.  This has been ongoing for 1 week.  It is worse when he gets up and moves around it resolves when he is at rest.  He has never had this sensation while he has been at rest.  He has a remote history of a heart attack 20 years ago.  He last saw cardiology 1 year ago.  He had an echocardiogram in 2024 which showed a mildly reduced EF to 45 to 50%.  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  He is on amlodipine  10 mg, carvedilol  25 mg, valsartan  hydrochlorothiazide  320-12.5 mg.  He has not taken this medication.  PERTINENT  PMH / PSH: reviewed and updated.  OBJECTIVE:   BP (!) 184/102   Pulse 89   Ht 5\' 4"  (1.626 m)   Wt 218 lb (98.9 kg)   SpO2 99%   BMI 37.42 kg/m   Well-appearing, no acute distress Cardio: Regular rate, regular rhythm, no murmurs on exam. Pulm: Clear, no wheezing, no crackles. No increased work of breathing Abdominal: bowel sounds present, soft, non-tender, non-distended Extremities: no peripheral edema  Neuro: alert and oriented x3, speech normal in content, no facial asymmetry, strength intact and equal bilaterally in UE and LE, pupils equal and reactive to light.  Psych:  Cognition and judgment appear intact. Alert, communicative  and cooperative with normal attention span and concentration. No apparent delusions, illusions, hallucinations    ASSESSMENT/PLAN:   Assessment & Plan Stable angina (HCC) History and presentation is very concerning.  Patient has a long documented history of noncompliance to medications.  Currently his chest pain appears to be stable and only exacerbated on exertion which is reassuring however this will need eventual cardiac management and workup.  I have sent an urgent referral to cardiology.  I suspect he will need cardiac catheterization as his diabetes been uncontrolled and he is not  compliant with statin and other medications.  I attempted to stress the importance of taking his medications and caring for his health.  I shared that if he does not become compliant with his medicines and continues to have chest pain that he will lead himself to have another heart attack or stroke.     Clem Currier, DO [facility]

## 2023-08-16 ENCOUNTER — Encounter: Payer: Self-pay | Admitting: Student

## 2023-08-16 DIAGNOSIS — I2089 Other forms of angina pectoris: Secondary | ICD-10-CM

## 2023-08-17 MED ORDER — NITROGLYCERIN 0.3 MG SL SUBL: 0.3000 mg | SUBLINGUAL_TABLET | SUBLINGUAL | 12 refills | Status: DC | PRN

## 2023-08-19 ENCOUNTER — Other Ambulatory Visit: Payer: Self-pay | Admitting: Family Medicine

## 2023-08-19 DIAGNOSIS — E119 Type 2 diabetes mellitus without complications: Secondary | ICD-10-CM

## 2023-08-21 ENCOUNTER — Ambulatory Visit

## 2023-08-21 VITALS — Ht 64.0 in | Wt 218.0 lb

## 2023-08-21 DIAGNOSIS — Z0000 Encounter for general adult medical examination without abnormal findings: Secondary | ICD-10-CM

## 2023-08-21 NOTE — Patient Instructions (Addendum)
 [name] , Thank you for taking time to come for your Medicare Wellness Visit. I appreciate your ongoing commitment to your health goals. Please review the following plan we discussed and let me know if I can assist you in the future.   Referrals/Orders/Follow-Ups/Clinician Recommendations: Yes, keep maintaining your health by keeping your appointments with Dr. Clem Currier and any specialists that you may see.  Call us  if you need anything.  Have a great year!!!!  This is a list of the screening recommended for you and due dates:  Health Maintenance  Topic Date Due   Zoster (Shingles) Vaccine (1 of 2) Never done   Complete foot exam   03/13/2019   Eye exam for diabetics  06/17/2021   COVID-19 Vaccine (1 - 2024-25 season) Never done   Colon Cancer Screening  08/29/2023*   Yearly kidney health urinalysis for diabetes  09/23/2023   Hemoglobin A1C  10/23/2023   Flu Shot  11/24/2023   Yearly kidney function blood test for diabetes  07/23/2024   Medicare Annual Wellness Visit  08/20/2024   DTaP/Tdap/Td vaccine (2 - Td or Tdap) 05/14/2028   Pneumonia Vaccine  Completed   Hepatitis C Screening  Completed   HPV Vaccine  Aged Out   Meningitis B Vaccine  Aged Out  *Topic was postponed. The date shown is not the original due date.    Advanced directives: (Provided) Advance directive discussed with you today. I have provided a copy for you to complete at home and have notarized. Once this is complete, please bring a copy in to our office so we can scan it into your chart.   Next Medicare Annual Wellness Visit scheduled for next year: Yes

## 2023-08-21 NOTE — Progress Notes (Signed)
 Because this visit was a virtual/telehealth visit,  certain criteria was not obtained, such a blood pressure, CBG if applicable, and timed get up and go. Any medications not marked as "taking" were not mentioned during the medication reconciliation part of the visit. Any vitals not documented were not able to be obtained due to this being a telehealth visit or patient was unable to self-report a recent blood pressure reading due to a lack of equipment at home via telehealth. Vitals that have been documented are verbally provided by the patient.   Subjective:   [name] is a 73 y.o. who presents for a Medicare Wellness preventive visit.  Visit Complete: Virtual I connected with  [name] on 08/21/23 by a audio enabled telemedicine application and verified that I am speaking with the correct person using two identifiers.  Patient Location: Home  Provider Location: Office/Clinic  I discussed the limitations of evaluation and management by telemedicine. The patient expressed understanding and agreed to proceed.  Vital Signs: Because this visit was a virtual/telehealth visit, some criteria may be missing or patient reported. Any vitals not documented were not able to be obtained and vitals that have been documented are patient reported.  VideoDeclined- This patient declined Librarian, academic. Therefore the visit was completed with audio only.  Persons Participating in Visit: Patient.  AWV Questionnaire: No: Patient Medicare AWV questionnaire was not completed prior to this visit.  Cardiac Risk Factors include: advanced age (>34men, >67 women);diabetes mellitus;dyslipidemia;family history of premature cardiovascular disease;hypertension;male gender;obesity (BMI >30kg/m2)     Objective:    Today's Vitals   08/21/23 1624  Weight: 218 lb (98.9 kg)  Height: 5\' 4"  (1.626 m)  PainSc: 0-No pain   Body mass index is 37.42 kg/m.     08/21/2023    4:26 PM  08/15/2023    3:50 PM 03/01/2023   10:10 AM 11/19/2022    9:43 AM 08/31/2022   10:12 AM 08/29/2022    4:14 PM 02/17/2022    2:51 PM  Advanced Directives  Does Patient Have a Medical Advance Directive? No No No No No No No  Would patient like information on creating a medical advance directive? Yes (MAU/Ambulatory/Procedural Areas - Information given) No - Patient declined  No - Patient declined No - Patient declined No - Patient declined No - Patient declined    Current Medications (verified) Outpatient Encounter Medications as of 08/21/2023  Medication Sig   Accu-Chek Softclix Lancets lancets USE AS DIRECTED UP TO FOUR TIMES DAILY   amLODipine  (NORVASC ) 10 MG tablet Take 1 tablet (10 mg total) by mouth daily for 15 days.   aspirin  EC 81 MG tablet Take 81 mg by mouth daily. (Patient not taking: Reported on 08/10/2023)   BD PEN NEEDLE NANO 2ND GEN 32G X 4 MM MISC USE 3 TIMES DAILY AS NEEDED BETWEEN MEALS AND BEDTIME   blood glucose meter kit and supplies Dispense based on patient and insurance preference. Use up to four times daily as directed. (FOR ICD-10 E10.9, E11.9).   carvedilol  (COREG ) 25 MG tablet Take 1 tablet (25 mg total) by mouth 2 (two) times daily.   Continuous Glucose Sensor (FREESTYLE LIBRE 3 SENSOR) MISC 2 Devices by Does not apply route as needed. Place 1 sensor on the skin every 14 days. Use to check glucose continuously   eplerenone  (INSPRA ) 25 MG tablet Take 1 tablet (25 mg total) by mouth daily.   glucose blood (ACCU-CHEK GUIDE) test strip Use as instructed  insulin  aspart (FIASP  FLEXTOUCH) 100 UNIT/ML FlexTouch Pen Inject 40 Units into the skin daily before supper.   Insulin  Glargine (BASAGLAR  KWIKPEN) 100 UNIT/ML DIAL AND INJECT 65 UNITS UNDER THE SKIN DAILY.   metFORMIN  (GLUCOPHAGE ) 500 MG tablet Take 1 tablet (500 mg total) by mouth 2 (two) times daily with a meal.   nitroGLYCERIN  (NITROSTAT ) 0.3 MG SL tablet Place 1 tablet (0.3 mg total) under the tongue every 5 (five)  minutes as needed for chest pain.   rosuvastatin  (CRESTOR ) 40 MG tablet Take 1 tablet (40 mg total) by mouth daily.   valsartan -hydrochlorothiazide  (DIOVAN -HCT) 320-12.5 MG tablet Take 1 tablet by mouth daily.   [DISCONTINUED] Insulin  Glargine (BASAGLAR  KWIKPEN) 100 UNIT/ML Inject 65 Units into the skin daily.   No facility-administered encounter medications on file as of 08/21/2023.    Allergies (verified) Trulicity [dulaglutide]   History: Past Medical History:  Diagnosis Date   Diabetes mellitus without complication (HCC)    Hypercholesterolemia    Hypertension    MI (myocardial infarction) (HCC)    Past Surgical History:  Procedure Laterality Date   CORONARY ANGIOPLASTY WITH STENT PLACEMENT  08/01/2003   Proximal LAD   Family History  Problem Relation Age of Onset   Heart disease Mother    Diabetes Mother    Hypertension Mother    Alcoholism Father    Heart disease Father    Hypertension Sister    Diabetes Brother    Hypercholesterolemia Brother    Osteoporosis Brother    Hypertension Brother    Kidney disease Brother    Social History   Socioeconomic History   Marital status: Married    Spouse name: Bartholomew Light   Number of children: 4   Years of education: 10   Highest education level: 10th grade  Occupational History   Occupation: retired  Tobacco Use   Smoking status: Former    Current packs/day: 0.00    Average packs/day: 0.5 packs/day for 10.0 years (5.0 ttl pk-yrs)    Types: Cigarettes    Start date: 04/26/1983    Quit date: 04/25/1993    Years since quitting: 30.3   Smokeless tobacco: Never  Vaping Use   Vaping status: Never Used  Substance and Sexual Activity   Alcohol use: No   Drug use: No   Sexual activity: Yes  Other Topics Concern   Not on file  Social History Narrative   Patient lives with his wife in [place].   Patent has 4 boys, one lives in [place] .    Patient enjoys fishing, camping in the woods, working outside, anything outdoor  related.   Social Drivers of Corporate investment banker Strain: Low Risk  (08/21/2023)   Overall Financial Resource Strain (CARDIA)    Difficulty of Paying Living Expenses: Not very hard  Food Insecurity: No Food Insecurity (08/21/2023)   Hunger Vital Sign    Worried About Running Out of Food in the Last Year: Never true    Ran Out of Food in the Last Year: Never true  Transportation Needs: No Transportation Needs (08/21/2023)   PRAPARE - Administrator, Civil Service (Medical): No    Lack of Transportation (Non-Medical): No  Physical Activity: Inactive (08/21/2023)   Exercise Vital Sign    Days of Exercise per Week: 0 days    Minutes of Exercise per Session: 0 min  Stress: No Stress Concern Present (08/21/2023)   [employer] of Occupational Health - Occupational Stress Questionnaire    Feeling of  Stress : Not at all  Social Connections: Socially Integrated (08/21/2023)   Social Connection and Isolation Panel [NHANES]    Frequency of Communication with Friends and Family: More than three times a week    Frequency of Social Gatherings with Friends and Family: More than three times a week    Attends Religious Services: More than 4 times per year    Active Member of [employer] or Organizations: Yes    Attends Banker Meetings: Never    Marital Status: Married    Tobacco Counseling Counseling given: Not Answered    Clinical Intake:  Pre-visit preparation completed: Yes  Pain : No/denies pain Pain Score: 0-No pain     BMI - recorded: 37.42 Nutritional Status: BMI > 30  Obese Nutritional Risks: None Diabetes: Yes CBG done?: No Did pt. bring in CBG monitor from home?: No  Lab Results  Component Value Date   HGBA1C >15.0 07/24/2023   HGBA1C 12.9 (A) 03/01/2023   HGBA1C 13.1 (A) 08/31/2022     How often do you need to have someone help you when you read instructions, pamphlets, or other written materials from your doctor or pharmacy?: 1 -  Never  Interpreter Needed?: No  Information entered by :: Inza Mikrut N. Meagon Duskin, LPN.   Activities of Daily Living     08/21/2023    4:29 PM 08/29/2022    4:14 PM  In your present state of health, do you have any difficulty performing the following activities:  Hearing? 0 0  Vision? 0 0  Difficulty concentrating or making decisions? 0 0  Walking or climbing stairs? 0 0  Dressing or bathing? 0 0  Doing errands, shopping? 0 0  Preparing Food and eating ? N N  Using the Toilet? N N  In the past six months, have you accidently leaked urine? N N  Do you have problems with loss of bowel control? N N  Managing your Medications? N N  Managing your Finances? N N  Housekeeping or managing your Housekeeping? N N    Patient Care Team: Clem Currier, DO as PCP - General (Family Medicine) Maximo Spar Aviva Lemmings, MD as PCP - Cardiology (Cardiology) Augustin Leber, RN as [facility] Care Management [place], [place], [place] as Consulting Physician (Optometry)  Indicate any recent Medical Services you may have received from other than [facility] providers in the past year (date may be approximate).     Assessment:   This is a routine wellness examination for [place].  Hearing/Vision screen Hearing Screening - Comments:: Denies hearing difficulties.  Vision Screening - Comments:: Wears eye glasses - up to date with routine eye exams with Dr. Fara Hone, O.D. at [facility].    Goals Addressed             This Visit's Progress    Patient Stated       08/21/23: "My goal is to get back into [employer]."       Depression Screen     08/21/2023    4:30 PM 03/01/2023   10:37 AM 08/31/2022   10:22 AM 08/29/2022    4:12 PM 02/17/2022    2:51 PM 02/02/2022   10:29 AM 08/23/2021    1:54 PM  PHQ 2/9 Scores  PHQ - 2 Score 0  0 0 3  3  PHQ- 9 Score 0  5  10  4   Exception Documentation  Patient refusal    Patient refusal     Fall Risk  08/21/2023    4:28 PM 08/15/2023    3:50 PM  03/01/2023   10:09 AM 08/31/2022   10:13 AM 08/29/2022    4:11 PM  Fall Risk   Falls in the past year? 0 0 0 0 0  Number falls in past yr: 0 0 0 0 0  Injury with Fall? 0 0 0 0 0  Risk for fall due to : No Fall Risks    No Fall Risks  Follow up Falls prevention discussed;Falls evaluation completed    Falls prevention discussed;Education provided;Falls evaluation completed    MEDICARE RISK AT HOME:  Medicare Risk at Home Any stairs in or around the home?: No (FRONT & BACK ENTRY) If so, are there any without handrails?: No Home free of loose throw rugs in walkways, pet beds, electrical cords, etc?: Yes Adequate lighting in your home to reduce risk of falls?: Yes Life alert?: No Use of a cane, walker or w/c?: No Grab bars in the bathroom?: Yes Shower chair or bench in shower?: Yes (WIFE HAS ONE) Elevated toilet seat or a handicapped toilet?: Yes  TIMED UP AND GO:  Was the test performed?  No  Cognitive Function: 6CIT completed    08/21/2023    4:29 PM  MMSE - Mini Mental State Exam  Not completed: Unable to complete        08/21/2023    4:29 PM 08/29/2022    4:15 PM 07/21/2020    1:52 PM  6CIT Screen  What Year? 0 points 0 points 0 points  What month? 0 points 0 points 0 points  What time? 0 points 0 points 0 points  Count back from 20 0 points 0 points 2 points  Months in reverse 0 points 0 points 2 points  Repeat phrase 0 points 0 points 0 points  Total Score 0 points 0 points 4 points    Immunizations Immunization History  Administered Date(s) Administered   Fluad Quad(high Dose 65+) 02/05/2019   Influenza,inj,Quad PF,6+ Mos 03/02/2018, 05/13/2020   Pneumococcal Conjugate-13 05/13/2020   Pneumococcal Polysaccharide-23 05/14/2018   Tdap 05/14/2018    Screening Tests Health Maintenance  Topic Date Due   Zoster Vaccines- Shingrix (1 of 2) Never done   FOOT EXAM  03/13/2019   OPHTHALMOLOGY EXAM  06/17/2021   COVID-19 Vaccine (1 - 2024-25 season) Never done    Medicare Annual Wellness (AWV)  08/29/2023   Colonoscopy  08/29/2023 (Originally 08/27/1995)   Diabetic kidney evaluation - Urine ACR  09/23/2023   HEMOGLOBIN A1C  10/23/2023   INFLUENZA VACCINE  11/24/2023   Diabetic kidney evaluation - eGFR measurement  07/23/2024   DTaP/Tdap/Td (2 - Td or Tdap) 05/14/2028   Pneumonia Vaccine 78+ Years old  Completed   Hepatitis C Screening  Completed   HPV VACCINES  Aged Out   Meningococcal B Vaccine  Aged Out    Health Maintenance  Health Maintenance Due  Topic Date Due   Zoster Vaccines- Shingrix (1 of 2) Never done   FOOT EXAM  03/13/2019   OPHTHALMOLOGY EXAM  06/17/2021   COVID-19 Vaccine (1 - 2024-25 season) Never done   Medicare Annual Wellness (AWV)  08/29/2023   Health Maintenance Items Addressed: Yes  Additional Screening:  Vision Screening: Recommended annual ophthalmology exams for early detection of glaucoma and other disorders of the eye.  Dental Screening: Recommended annual dental exams for proper oral hygiene  Community Resource Referral / Chronic Care Management: CRR required this visit?  No   CCM  required this visit?  No     Plan:     I have personally reviewed and noted the following in the patient's chart:   Medical and social history Use of alcohol, tobacco or illicit drugs  Current medications and supplements including opioid prescriptions. Patient is not currently taking opioid prescriptions. Functional ability and status Nutritional status Physical activity Advanced directives List of other physicians Hospitalizations, surgeries, and ER visits in previous 12 months Vitals Screenings to include cognitive, depression, and falls Referrals and appointments  In addition, I have reviewed and discussed with patient certain preventive protocols, quality metrics, and best practice recommendations. A written personalized care plan for preventive services as well as general preventive health recommendations were  provided to patient.     Margette Sheldon, LPN   1/61/0960   After Visit Summary: (MyChart) Due to this being a telephonic visit, the after visit summary with patients personalized plan was offered to patient via MyChart   Notes: This nurse requested notes from Optometrist during this visit.

## 2023-08-28 ENCOUNTER — Other Ambulatory Visit: Payer: Self-pay

## 2023-08-28 DIAGNOSIS — E119 Type 2 diabetes mellitus without complications: Secondary | ICD-10-CM

## 2023-08-28 NOTE — Telephone Encounter (Signed)
 Pharmacy faxed form for patient to have 3-4 month supply for medication Basaglar .  Placed from in PCPs box to fill out.  Christ Courier, CMA

## 2023-08-29 ENCOUNTER — Ambulatory Visit: Admitting: Pharmacist

## 2023-08-29 NOTE — Telephone Encounter (Signed)
 Copy made for scanning.   Prescription placed in fax pile.

## 2023-09-05 ENCOUNTER — Telehealth: Payer: Self-pay | Admitting: Pharmacist

## 2023-09-05 ENCOUNTER — Ambulatory Visit (INDEPENDENT_AMBULATORY_CARE_PROVIDER_SITE_OTHER): Admitting: Student

## 2023-09-05 ENCOUNTER — Telehealth: Payer: Self-pay

## 2023-09-05 ENCOUNTER — Encounter: Payer: Self-pay | Admitting: Pharmacist

## 2023-09-05 VITALS — BP 154/80 | HR 91 | Wt 214.6 lb

## 2023-09-05 DIAGNOSIS — Z794 Long term (current) use of insulin: Secondary | ICD-10-CM | POA: Diagnosis not present

## 2023-09-05 DIAGNOSIS — E119 Type 2 diabetes mellitus without complications: Secondary | ICD-10-CM | POA: Diagnosis not present

## 2023-09-05 DIAGNOSIS — R079 Chest pain, unspecified: Secondary | ICD-10-CM | POA: Diagnosis not present

## 2023-09-05 NOTE — Telephone Encounter (Signed)
 5 boxes of Basaglar  are ready for pickup.   112 day supply.

## 2023-09-05 NOTE — Assessment & Plan Note (Signed)
 T2DM and HTN appear uncontrolled but some improvement noticed. Emphasized importance of adherence to all medications to patient for control of sugar, blood pressure, to decrease frequency of angina, and reduction in risk of MI/stroke. Adherence appears improved based on change in GMI in comparison to A1c but not at goal. Will call wife to ensure she is aware of poor adherence and can ensure he takes his medications.

## 2023-09-05 NOTE — Patient Instructions (Addendum)
 It was nice to see you today!  Your goal blood sugar is 80-130 before eating and less than 180 after eating.  Medication Changes: Continue to take all of your medications as prescribed, even if you are feeling good on a given day.   Continue all other medication the same.   Keep up the good work with diet and exercise. Aim for a diet full of vegetables, fruit and lean meats (chicken, [place], fish). Try to limit salt intake by eating fresh or frozen vegetables (instead of canned), rinse canned vegetables prior to cooking and do not add any additional salt to meals.    It was great to see you! Thank you for allowing me to participate in your care!   I recommend that you always bring your medications to each appointment as this makes it easy to ensure we are on the correct medications and helps us  not miss when refills are needed.  Our plans for today:  - If you experience chest pain lasting more than 15 minutes and does not improve with rest, please go to the emergency department immediately. -Please follow-up with your cardiologist -An echocardiogram has been ordered for you, they will call you to schedule  Take care and seek immediate care sooner if you develop any concerns. Please remember to show up 15 minutes before your scheduled appointment time!  [name], DO [facility]

## 2023-09-05 NOTE — Progress Notes (Signed)
    SUBJECTIVE:   CHIEF COMPLAINT / HPI:   Chest pain Patient was seen today in our pharmacy clinic for follow-up of his type 2 diabetes and hypertension.  Patient was endorsing intermittent exertional chest pain.  No current chest pain.  Pain is described as bilateral chest pain, burning sensation, with dyspnea that occurs while walking.  Patient cannot ambulate from room to car without issues, however distances greater than that tend to give him trouble.  Chest pain will last up to 15 minutes, resolves with rest.  He is prescribed nitroglycerin , but does not take this.  He has intermittent compliance with his medications-which has been counseled on with pharmacy today.  Patient is asymptomatic at the current moment.  But does have pertinent medical history of atypical angina, coronary stent, CAD, HFrEF, hypertension, type 2 diabetes, hyperlipidemia which puts him at high risk of ACS.   OBJECTIVE:   BP (!) 151/84   Pulse 91   Wt 214 lb 9.6 oz (97.3 kg)   SpO2 99%   BMI 36.84 kg/m    General: NAD, pleasant Cardio: RRR, no MRG. Cap Refill <2s. Respiratory: CTAB, normal wob on RA Skin: Warm and dry    EKG: Normal sinus rhythm, no acute ischemia. Nonspecific T wave change in leads V3/V4.  ASSESSMENT/PLAN:   Assessment & Plan Chest pain, unspecified type Differential includes: CHF, stable angina.  Lower concern for MI at this time. - Follow-up with cardiology next month, will likely need stress test versus CT angiogram - Obtain echocardiogram - Continue aspirin  81 mg, rosuvastatin , carvedilol  - Strict ED precautions provided   [name], DO [facility]

## 2023-09-05 NOTE — Progress Notes (Signed)
 Subjective: At today's pharmacy visit today, patient described "burning chest sensation" and unexpected sweating when exerting himself. Reports that he has noticed this symptom on most days when he exerts himself including when he walks short distances. Once he sits down, burning sensation goes away after ~5 minutes.   Denies any pain at the time of appointment today. Patient was provided prescription for NTG that he never filled. Counseled patient on how to use NTG, and when to call 911 regarding chest pain. Because of frequency of atypical angina, referred to Dr. Telford Feather   FreeStyle Libre 3+ Data today: 4/20 - 5/13  Average BG: 261 mg/dL  GMI: 8.2%  Time in target range: 22%  Time above range: 77% (53% >250 mg/dL)  Time below range: 1% Patterns: Significant difference noticed on days where patient appears to insulin /other medications.   Objective:  A1c of >15% on 07/24/23 BP of 155/80 and 154/80 in clinic today. HR 87 and 91.   A/P:  T2DM and HTN appear uncontrolled but some improvement noticed. Emphasized importance of adherence to all medications to patient for control of sugar, blood pressure, to decrease frequency of angina, and reduction in risk of MI/stroke. Adherence appears improved based on change in GMI in comparison to A1c but not at goal. Will call wife to ensure she is aware of poor adherence and can ensure he takes his medications.   Spent 56 minutes with patient. Next visit with Dr. Jamir Rone on 6/3, and visit with cardiologist on 6/16. Patient seen with Noah [place], PharmD Candidate.

## 2023-09-05 NOTE — Telephone Encounter (Signed)
 Patient's spouse contacted for assistance with medication adherence.   Patient's spouse agreed to help monitor patient taking medications.   She thanked me for the concern.    I thanked her for her efforts to improve control of glucose and blood pressure.   Total time with patient call and documentation of interaction: 7 minutes.

## 2023-09-06 NOTE — Telephone Encounter (Signed)
 Reviewed and agree with Dr Macky Lower plan.

## 2023-09-06 NOTE — Progress Notes (Signed)
 Reviewed and agree with Dr Macky Lower plan.

## 2023-09-12 NOTE — Telephone Encounter (Signed)
 Left message informing patient that insulin  is ready for pickup here at the office.

## 2023-09-21 ENCOUNTER — Other Ambulatory Visit: Payer: Self-pay

## 2023-09-21 ENCOUNTER — Inpatient Hospital Stay (HOSPITAL_COMMUNITY)
Admission: EM | Admit: 2023-09-21 | Discharge: 2023-10-13 | DRG: 233 | Disposition: A | Discharge status: Home Health | Attending: Thoracic Surgery (Cardiothoracic Vascular Surgery) | Admitting: Thoracic Surgery (Cardiothoracic Vascular Surgery)

## 2023-09-21 ENCOUNTER — Inpatient Hospital Stay (HOSPITAL_COMMUNITY)

## 2023-09-21 ENCOUNTER — Emergency Department (HOSPITAL_COMMUNITY)

## 2023-09-21 ENCOUNTER — Encounter (HOSPITAL_COMMUNITY): Payer: Self-pay

## 2023-09-21 ENCOUNTER — Encounter (HOSPITAL_COMMUNITY)
Admission: EM | Disposition: A | Discharge status: Home Health | Payer: Self-pay | Source: Home / Self Care | Attending: Thoracic Surgery (Cardiothoracic Vascular Surgery)

## 2023-09-21 DIAGNOSIS — E7841 Elevated Lipoprotein(a): Secondary | ICD-10-CM | POA: Diagnosis not present

## 2023-09-21 DIAGNOSIS — N179 Acute kidney failure, unspecified: Secondary | ICD-10-CM | POA: Diagnosis not present

## 2023-09-21 DIAGNOSIS — E7800 Pure hypercholesterolemia, unspecified: Secondary | ICD-10-CM | POA: Diagnosis not present

## 2023-09-21 DIAGNOSIS — R579 Shock, unspecified: Secondary | ICD-10-CM | POA: Diagnosis not present

## 2023-09-21 DIAGNOSIS — J9811 Atelectasis: Secondary | ICD-10-CM | POA: Diagnosis not present

## 2023-09-21 DIAGNOSIS — I240 Acute coronary thrombosis not resulting in myocardial infarction: Secondary | ICD-10-CM | POA: Diagnosis not present

## 2023-09-21 DIAGNOSIS — Z8249 Family history of ischemic heart disease and other diseases of the circulatory system: Secondary | ICD-10-CM

## 2023-09-21 DIAGNOSIS — K9189 Other postprocedural complications and disorders of digestive system: Secondary | ICD-10-CM | POA: Diagnosis not present

## 2023-09-21 DIAGNOSIS — R079 Chest pain, unspecified: Secondary | ICD-10-CM | POA: Diagnosis not present

## 2023-09-21 DIAGNOSIS — I255 Ischemic cardiomyopathy: Secondary | ICD-10-CM | POA: Diagnosis present

## 2023-09-21 DIAGNOSIS — Z452 Encounter for adjustment and management of vascular access device: Secondary | ICD-10-CM | POA: Diagnosis not present

## 2023-09-21 DIAGNOSIS — G9341 Metabolic encephalopathy: Secondary | ICD-10-CM | POA: Diagnosis not present

## 2023-09-21 DIAGNOSIS — I129 Hypertensive chronic kidney disease with stage 1 through stage 4 chronic kidney disease, or unspecified chronic kidney disease: Secondary | ICD-10-CM | POA: Diagnosis not present

## 2023-09-21 DIAGNOSIS — Z794 Long term (current) use of insulin: Secondary | ICD-10-CM

## 2023-09-21 DIAGNOSIS — I5043 Acute on chronic combined systolic (congestive) and diastolic (congestive) heart failure: Secondary | ICD-10-CM | POA: Diagnosis not present

## 2023-09-21 DIAGNOSIS — Z6835 Body mass index (BMI) 35.0-35.9, adult: Secondary | ICD-10-CM

## 2023-09-21 DIAGNOSIS — G4700 Insomnia, unspecified: Secondary | ICD-10-CM | POA: Diagnosis present

## 2023-09-21 DIAGNOSIS — I214 Non-ST elevation (NSTEMI) myocardial infarction: Principal | ICD-10-CM

## 2023-09-21 DIAGNOSIS — J9601 Acute respiratory failure with hypoxia: Secondary | ICD-10-CM | POA: Diagnosis not present

## 2023-09-21 DIAGNOSIS — I25118 Atherosclerotic heart disease of native coronary artery with other forms of angina pectoris: Secondary | ICD-10-CM | POA: Diagnosis not present

## 2023-09-21 DIAGNOSIS — I130 Hypertensive heart and chronic kidney disease with heart failure and stage 1 through stage 4 chronic kidney disease, or unspecified chronic kidney disease: Secondary | ICD-10-CM | POA: Diagnosis not present

## 2023-09-21 DIAGNOSIS — E1122 Type 2 diabetes mellitus with diabetic chronic kidney disease: Secondary | ICD-10-CM | POA: Diagnosis not present

## 2023-09-21 DIAGNOSIS — N183 Chronic kidney disease, stage 3 unspecified: Secondary | ICD-10-CM | POA: Diagnosis not present

## 2023-09-21 DIAGNOSIS — I358 Other nonrheumatic aortic valve disorders: Secondary | ICD-10-CM | POA: Diagnosis present

## 2023-09-21 DIAGNOSIS — Z48812 Encounter for surgical aftercare following surgery on the circulatory system: Secondary | ICD-10-CM | POA: Diagnosis not present

## 2023-09-21 DIAGNOSIS — E785 Hyperlipidemia, unspecified: Secondary | ICD-10-CM | POA: Diagnosis not present

## 2023-09-21 DIAGNOSIS — D62 Acute posthemorrhagic anemia: Secondary | ICD-10-CM | POA: Diagnosis not present

## 2023-09-21 DIAGNOSIS — Z7984 Long term (current) use of oral hypoglycemic drugs: Secondary | ICD-10-CM | POA: Diagnosis not present

## 2023-09-21 DIAGNOSIS — R9389 Abnormal findings on diagnostic imaging of other specified body structures: Secondary | ICD-10-CM | POA: Diagnosis not present

## 2023-09-21 DIAGNOSIS — T8111XA Postprocedural  cardiogenic shock, initial encounter: Secondary | ICD-10-CM | POA: Diagnosis not present

## 2023-09-21 DIAGNOSIS — N170 Acute kidney failure with tubular necrosis: Secondary | ICD-10-CM | POA: Diagnosis not present

## 2023-09-21 DIAGNOSIS — T383X6A Underdosing of insulin and oral hypoglycemic [antidiabetic] drugs, initial encounter: Secondary | ICD-10-CM | POA: Diagnosis present

## 2023-09-21 DIAGNOSIS — I5021 Acute systolic (congestive) heart failure: Secondary | ICD-10-CM | POA: Diagnosis not present

## 2023-09-21 DIAGNOSIS — N1831 Chronic kidney disease, stage 3a: Secondary | ICD-10-CM | POA: Diagnosis not present

## 2023-09-21 DIAGNOSIS — I10 Essential (primary) hypertension: Secondary | ICD-10-CM | POA: Diagnosis present

## 2023-09-21 DIAGNOSIS — E119 Type 2 diabetes mellitus without complications: Secondary | ICD-10-CM

## 2023-09-21 DIAGNOSIS — Z992 Dependence on renal dialysis: Secondary | ICD-10-CM | POA: Diagnosis not present

## 2023-09-21 DIAGNOSIS — Z7982 Long term (current) use of aspirin: Secondary | ICD-10-CM

## 2023-09-21 DIAGNOSIS — R918 Other nonspecific abnormal finding of lung field: Secondary | ICD-10-CM | POA: Diagnosis not present

## 2023-09-21 DIAGNOSIS — K567 Ileus, unspecified: Secondary | ICD-10-CM | POA: Diagnosis not present

## 2023-09-21 DIAGNOSIS — Z7902 Long term (current) use of antithrombotics/antiplatelets: Secondary | ICD-10-CM

## 2023-09-21 DIAGNOSIS — Z4682 Encounter for fitting and adjustment of non-vascular catheter: Secondary | ICD-10-CM | POA: Diagnosis not present

## 2023-09-21 DIAGNOSIS — E1169 Type 2 diabetes mellitus with other specified complication: Secondary | ICD-10-CM | POA: Diagnosis not present

## 2023-09-21 DIAGNOSIS — Z87891 Personal history of nicotine dependence: Secondary | ICD-10-CM

## 2023-09-21 DIAGNOSIS — Z01810 Encounter for preprocedural cardiovascular examination: Secondary | ICD-10-CM | POA: Diagnosis not present

## 2023-09-21 DIAGNOSIS — I2510 Atherosclerotic heart disease of native coronary artery without angina pectoris: Secondary | ICD-10-CM | POA: Diagnosis present

## 2023-09-21 DIAGNOSIS — Y838 Other surgical procedures as the cause of abnormal reaction of the patient, or of later complication, without mention of misadventure at the time of the procedure: Secondary | ICD-10-CM | POA: Diagnosis not present

## 2023-09-21 DIAGNOSIS — Z789 Other specified health status: Secondary | ICD-10-CM

## 2023-09-21 DIAGNOSIS — I517 Cardiomegaly: Secondary | ICD-10-CM | POA: Diagnosis not present

## 2023-09-21 DIAGNOSIS — Z91148 Patient's other noncompliance with medication regimen for other reason: Secondary | ICD-10-CM

## 2023-09-21 DIAGNOSIS — Z79899 Other long term (current) drug therapy: Secondary | ICD-10-CM

## 2023-09-21 DIAGNOSIS — Z955 Presence of coronary angioplasty implant and graft: Secondary | ICD-10-CM

## 2023-09-21 DIAGNOSIS — J811 Chronic pulmonary edema: Secondary | ICD-10-CM | POA: Diagnosis not present

## 2023-09-21 DIAGNOSIS — I5023 Acute on chronic systolic (congestive) heart failure: Secondary | ICD-10-CM | POA: Diagnosis not present

## 2023-09-21 DIAGNOSIS — I252 Old myocardial infarction: Secondary | ICD-10-CM

## 2023-09-21 DIAGNOSIS — E66812 Obesity, class 2: Secondary | ICD-10-CM | POA: Diagnosis present

## 2023-09-21 DIAGNOSIS — I259 Chronic ischemic heart disease, unspecified: Secondary | ICD-10-CM | POA: Diagnosis not present

## 2023-09-21 DIAGNOSIS — E872 Acidosis, unspecified: Secondary | ICD-10-CM | POA: Diagnosis not present

## 2023-09-21 DIAGNOSIS — Z0389 Encounter for observation for other suspected diseases and conditions ruled out: Secondary | ICD-10-CM | POA: Diagnosis not present

## 2023-09-21 DIAGNOSIS — T447X6A Underdosing of beta-adrenoreceptor antagonists, initial encounter: Secondary | ICD-10-CM | POA: Diagnosis present

## 2023-09-21 DIAGNOSIS — R570 Cardiogenic shock: Secondary | ICD-10-CM | POA: Diagnosis not present

## 2023-09-21 DIAGNOSIS — E1165 Type 2 diabetes mellitus with hyperglycemia: Secondary | ICD-10-CM | POA: Diagnosis not present

## 2023-09-21 DIAGNOSIS — Z951 Presence of aortocoronary bypass graft: Secondary | ICD-10-CM

## 2023-09-21 DIAGNOSIS — Z9889 Other specified postprocedural states: Secondary | ICD-10-CM | POA: Diagnosis not present

## 2023-09-21 DIAGNOSIS — D649 Anemia, unspecified: Secondary | ICD-10-CM | POA: Diagnosis not present

## 2023-09-21 DIAGNOSIS — Z91128 Patient's intentional underdosing of medication regimen for other reason: Secondary | ICD-10-CM

## 2023-09-21 DIAGNOSIS — D72829 Elevated white blood cell count, unspecified: Secondary | ICD-10-CM | POA: Diagnosis not present

## 2023-09-21 DIAGNOSIS — Z555 Less than a high school diploma: Secondary | ICD-10-CM

## 2023-09-21 DIAGNOSIS — J90 Pleural effusion, not elsewhere classified: Secondary | ICD-10-CM | POA: Diagnosis not present

## 2023-09-21 DIAGNOSIS — R911 Solitary pulmonary nodule: Secondary | ICD-10-CM | POA: Diagnosis present

## 2023-09-21 DIAGNOSIS — R0989 Other specified symptoms and signs involving the circulatory and respiratory systems: Secondary | ICD-10-CM | POA: Diagnosis not present

## 2023-09-21 DIAGNOSIS — N189 Chronic kidney disease, unspecified: Secondary | ICD-10-CM | POA: Insufficient documentation

## 2023-09-21 DIAGNOSIS — R5381 Other malaise: Secondary | ICD-10-CM

## 2023-09-21 DIAGNOSIS — E7801 Familial hypercholesterolemia: Secondary | ICD-10-CM | POA: Diagnosis present

## 2023-09-21 HISTORY — DX: Disorder of kidney and ureter, unspecified: N28.9

## 2023-09-21 HISTORY — DX: Non-ST elevation (NSTEMI) myocardial infarction: I21.4

## 2023-09-21 HISTORY — PX: LEFT HEART CATH AND CORONARY ANGIOGRAPHY: CATH118249

## 2023-09-21 HISTORY — DX: Atherosclerotic heart disease of native coronary artery without angina pectoris: I25.10

## 2023-09-21 LAB — HEPATIC FUNCTION PANEL
ALT: 15 U/L (ref 0–44)
AST: 15 U/L (ref 15–41)
Albumin: 2.9 g/dL — ABNORMAL LOW (ref 3.5–5.0)
Alkaline Phosphatase: 64 U/L (ref 38–126)
Bilirubin, Direct: <0.1 mg/dL (ref 0.0–0.2)
Total Bilirubin: 0.6 mg/dL (ref 0.0–1.2)
Total Protein: 6.8 g/dL (ref 6.5–8.1)

## 2023-09-21 LAB — GLUCOSE, CAPILLARY
Glucose-Capillary: 329 mg/dL — ABNORMAL HIGH (ref 70–99)
Glucose-Capillary: 339 mg/dL — ABNORMAL HIGH (ref 70–99)

## 2023-09-21 LAB — CBC
HCT: 43.6 % (ref 39.0–52.0)
Hemoglobin: 14.2 g/dL (ref 13.0–17.0)
MCH: 28.9 pg (ref 26.0–34.0)
MCHC: 32.6 g/dL (ref 30.0–36.0)
MCV: 88.6 fL (ref 80.0–100.0)
Platelets: 315 10*3/uL (ref 150–400)
RBC: 4.92 MIL/uL (ref 4.22–5.81)
RDW: 12.3 % (ref 11.5–15.5)
WBC: 9 10*3/uL (ref 4.0–10.5)
nRBC: 0 % (ref 0.0–0.2)

## 2023-09-21 LAB — ECHOCARDIOGRAM COMPLETE
AR max vel: 2.11 cm2
AV Area VTI: 2.06 cm2
AV Area mean vel: 2.22 cm2
AV Mean grad: 2 mmHg
AV Peak grad: 3.9 mmHg
Ao pk vel: 0.98 m/s
Area-P 1/2: 5.84 cm2
Height: 64 in
S' Lateral: 3.8 cm
Weight: 3424 [oz_av]

## 2023-09-21 LAB — BASIC METABOLIC PANEL WITH GFR
Anion gap: 10 (ref 5–15)
BUN: 25 mg/dL — ABNORMAL HIGH (ref 8–23)
CO2: 23 mmol/L (ref 22–32)
Calcium: 8.8 mg/dL — ABNORMAL LOW (ref 8.9–10.3)
Chloride: 104 mmol/L (ref 98–111)
Creatinine, Ser: 1.54 mg/dL — ABNORMAL HIGH (ref 0.61–1.24)
GFR, Estimated: 47 mL/min — ABNORMAL LOW (ref >60)
Glucose, Bld: 337 mg/dL — ABNORMAL HIGH (ref 70–99)
Potassium: 3.5 mmol/L (ref 3.5–5.1)
Sodium: 137 mmol/L (ref 135–145)

## 2023-09-21 LAB — TROPONIN I (HIGH SENSITIVITY)
Troponin I (High Sensitivity): 323 ng/L (ref <18)
Troponin I (High Sensitivity): 272 ng/L (ref <18)
Troponin I (High Sensitivity): 673 ng/L (ref <18)
Troponin I (High Sensitivity): 4189 ng/L (ref <18)

## 2023-09-21 LAB — LIPASE, BLOOD: Lipase: 45 U/L (ref 11–51)

## 2023-09-21 LAB — CBG MONITORING, ED: Glucose-Capillary: 369 mg/dL — ABNORMAL HIGH (ref 70–99)

## 2023-09-21 LAB — BRAIN NATRIURETIC PEPTIDE: B Natriuretic Peptide: 128.5 pg/mL — ABNORMAL HIGH (ref 0.0–100.0)

## 2023-09-21 LAB — HEPARIN LEVEL (UNFRACTIONATED): Heparin Unfractionated: 0.13 [IU]/mL — ABNORMAL LOW (ref 0.30–0.70)

## 2023-09-21 MED ORDER — SODIUM CHLORIDE 0.9 % IV SOLN: 250.0000 mL | INTRAVENOUS | Status: AC | PRN

## 2023-09-21 MED ORDER — INSULIN ASPART 100 UNIT/ML IJ SOLN
0.0000 [IU] | Freq: Three times a day (TID) | INTRAMUSCULAR | Status: DC
  Administered 2023-09-21: 11 [IU] via SUBCUTANEOUS
  Administered 2023-09-22: 3 [IU] via SUBCUTANEOUS
  Administered 2023-09-22: 5 [IU] via SUBCUTANEOUS
  Administered 2023-09-22: 8 [IU] via SUBCUTANEOUS
  Administered 2023-09-23 (×3): 3 [IU] via SUBCUTANEOUS
  Administered 2023-09-24 – 2023-09-25 (×4): 5 [IU] via SUBCUTANEOUS
  Administered 2023-09-26: 3 [IU] via SUBCUTANEOUS
  Administered 2023-09-26: 2 [IU] via SUBCUTANEOUS
  Administered 2023-09-26: 3 [IU] via SUBCUTANEOUS

## 2023-09-21 MED ORDER — HYDROCHLOROTHIAZIDE 12.5 MG PO TABS
12.5000 mg | ORAL_TABLET | Freq: Every day | ORAL | Status: DC
  Administered 2023-09-21: 12.5 mg via ORAL
  Filled 2023-09-21: qty 1

## 2023-09-21 MED ORDER — FUROSEMIDE 10 MG/ML IJ SOLN
INTRAMUSCULAR | Status: AC
  Filled 2023-09-21: qty 4

## 2023-09-21 MED ORDER — IOHEXOL 350 MG/ML SOLN
INTRAVENOUS | Status: DC | PRN
  Administered 2023-09-21: 40 mL

## 2023-09-21 MED ORDER — PERFLUTREN LIPID MICROSPHERE
1.0000 mL | INTRAVENOUS | Status: AC | PRN
  Administered 2023-09-21: 2 mL via INTRAVENOUS

## 2023-09-21 MED ORDER — FUROSEMIDE 10 MG/ML IJ SOLN
INTRAMUSCULAR | Status: DC | PRN
  Administered 2023-09-21: 40 mg via INTRAVENOUS

## 2023-09-21 MED ORDER — MIDAZOLAM HCL 2 MG/2ML IJ SOLN
INTRAMUSCULAR | Status: AC
Start: 2023-09-21 — End: ?
  Filled 2023-09-21: qty 2

## 2023-09-21 MED ORDER — ALUM & MAG HYDROXIDE-SIMETH 200-200-20 MG/5ML PO SUSP
30.0000 mL | Freq: Once | ORAL | Status: AC
  Administered 2023-09-21: 30 mL via ORAL
  Filled 2023-09-21: qty 30

## 2023-09-21 MED ORDER — ROSUVASTATIN CALCIUM 20 MG PO TABS
40.0000 mg | ORAL_TABLET | Freq: Every evening | ORAL | Status: DC
  Administered 2023-09-22 – 2023-09-24 (×3): 40 mg via ORAL
  Filled 2023-09-21 (×3): qty 2

## 2023-09-21 MED ORDER — HYDRALAZINE HCL 20 MG/ML IJ SOLN
INTRAMUSCULAR | Status: DC | PRN
  Administered 2023-09-21: 10 mg via INTRAVENOUS

## 2023-09-21 MED ORDER — INSULIN GLARGINE-YFGN 100 UNIT/ML ~~LOC~~ SOLN
30.0000 [IU] | Freq: Every day | SUBCUTANEOUS | Status: DC
  Administered 2023-09-21: 30 [IU] via SUBCUTANEOUS
  Filled 2023-09-21 (×2): qty 0.3

## 2023-09-21 MED ORDER — INSULIN GLARGINE-YFGN 100 UNIT/ML ~~LOC~~ SOLN: 30.0000 [IU] | Freq: Every day | SUBCUTANEOUS | Status: DC

## 2023-09-21 MED ORDER — NITROGLYCERIN IN D5W 200-5 MCG/ML-% IV SOLN: 0.0000 ug/min | INTRAVENOUS | Status: DC

## 2023-09-21 MED ORDER — HYDRALAZINE HCL 20 MG/ML IJ SOLN: 10.0000 mg | INTRAMUSCULAR | Status: AC | PRN

## 2023-09-21 MED ORDER — VERAPAMIL HCL 2.5 MG/ML IV SOLN
INTRAVENOUS | Status: DC | PRN
  Administered 2023-09-21: 10 mL via INTRA_ARTERIAL

## 2023-09-21 MED ORDER — HEPARIN BOLUS VIA INFUSION
4000.0000 [IU] | Freq: Once | INTRAVENOUS | Status: AC
  Administered 2023-09-21: 4000 [IU] via INTRAVENOUS
  Filled 2023-09-21: qty 4000

## 2023-09-21 MED ORDER — LIDOCAINE HCL (PF) 1 % IJ SOLN
INTRAMUSCULAR | Status: AC
  Filled 2023-09-21: qty 30

## 2023-09-21 MED ORDER — FENTANYL CITRATE (PF) 100 MCG/2ML IJ SOLN
INTRAMUSCULAR | Status: DC | PRN
  Administered 2023-09-21: 25 ug via INTRAVENOUS

## 2023-09-21 MED ORDER — HEPARIN (PORCINE) 25000 UT/250ML-% IV SOLN
1250.0000 [IU]/h | INTRAVENOUS | Status: DC
  Administered 2023-09-21: 950 [IU]/h via INTRAVENOUS
  Administered 2023-09-23 – 2023-09-27 (×4): 1250 [IU]/h via INTRAVENOUS
  Filled 2023-09-21 (×6): qty 250

## 2023-09-21 MED ORDER — SODIUM CHLORIDE 0.9% FLUSH
3.0000 mL | Freq: Two times a day (BID) | INTRAVENOUS | Status: DC
  Administered 2023-09-21 – 2023-09-26 (×11): 3 mL via INTRAVENOUS

## 2023-09-21 MED ORDER — VALSARTAN-HYDROCHLOROTHIAZIDE 320-12.5 MG PO TABS: 1.0000 | ORAL_TABLET | Freq: Every day | ORAL | Status: DC

## 2023-09-21 MED ORDER — LABETALOL HCL 5 MG/ML IV SOLN
INTRAVENOUS | Status: AC
  Filled 2023-09-21: qty 4

## 2023-09-21 MED ORDER — AMLODIPINE BESYLATE 5 MG PO TABS
5.0000 mg | ORAL_TABLET | Freq: Every day | ORAL | Status: DC
  Administered 2023-09-21: 5 mg via ORAL
  Filled 2023-09-21: qty 1

## 2023-09-21 MED ORDER — NITROGLYCERIN IN D5W 200-5 MCG/ML-% IV SOLN
0.0000 ug/min | INTRAVENOUS | Status: DC
  Administered 2023-09-21: 10 ug/min via INTRAVENOUS
  Administered 2023-09-21: 15 ug/min via INTRAVENOUS
  Administered 2023-09-21: 30 ug/min via INTRAVENOUS
  Administered 2023-09-27: 5 ug/min via INTRAVENOUS
  Filled 2023-09-21: qty 250

## 2023-09-21 MED ORDER — IRBESARTAN 300 MG PO TABS
300.0000 mg | ORAL_TABLET | Freq: Every day | ORAL | Status: DC
  Administered 2023-09-21: 300 mg via ORAL
  Filled 2023-09-21: qty 1

## 2023-09-21 MED ORDER — ASPIRIN 325 MG PO TABS
325.0000 mg | ORAL_TABLET | Freq: Every day | ORAL | Status: DC
  Administered 2023-09-21: 325 mg via ORAL
  Filled 2023-09-21: qty 1

## 2023-09-21 MED ORDER — HEPARIN SODIUM (PORCINE) 1000 UNIT/ML IJ SOLN
INTRAMUSCULAR | Status: AC
  Filled 2023-09-21: qty 10

## 2023-09-21 MED ORDER — MIDAZOLAM HCL 2 MG/2ML IJ SOLN
INTRAMUSCULAR | Status: DC | PRN
  Administered 2023-09-21: 1 mg via INTRAVENOUS

## 2023-09-21 MED ORDER — SODIUM CHLORIDE 0.9% FLUSH
3.0000 mL | INTRAVENOUS | Status: DC | PRN
Start: 2023-09-21 — End: 2023-09-27

## 2023-09-21 MED ORDER — LABETALOL HCL 5 MG/ML IV SOLN
10.0000 mg | INTRAVENOUS | Status: AC | PRN
Start: 2023-09-21 — End: 2023-09-21
  Administered 2023-09-21: 10 mg via INTRAVENOUS

## 2023-09-21 MED ORDER — HEPARIN (PORCINE) 25000 UT/250ML-% IV SOLN
950.0000 [IU]/h | INTRAVENOUS | Status: DC
  Administered 2023-09-21: 950 [IU]/h via INTRAVENOUS
  Filled 2023-09-21: qty 250

## 2023-09-21 MED ORDER — SODIUM CHLORIDE 0.9 % IV SOLN: INTRAVENOUS | Status: AC

## 2023-09-21 MED ORDER — INSULIN ASPART 100 UNIT/ML IJ SOLN
INTRAMUSCULAR | Status: AC
  Filled 2023-09-21: qty 1

## 2023-09-21 MED ORDER — FENTANYL CITRATE (PF) 100 MCG/2ML IJ SOLN
INTRAMUSCULAR | Status: AC
  Filled 2023-09-21: qty 2

## 2023-09-21 MED ORDER — SODIUM CHLORIDE 0.9 % IV SOLN: INTRAVENOUS | Status: DC

## 2023-09-21 MED ORDER — HEPARIN (PORCINE) IN NACL 1000-0.9 UT/500ML-% IV SOLN
INTRAVENOUS | Status: DC | PRN
  Administered 2023-09-21: 1000 mL via SURGICAL_CAVITY

## 2023-09-21 MED ORDER — LIDOCAINE HCL (PF) 1 % IJ SOLN
INTRAMUSCULAR | Status: DC | PRN
  Administered 2023-09-21: 2 mL

## 2023-09-21 MED ORDER — BASAGLAR KWIKPEN 100 UNIT/ML ~~LOC~~ SOPN: 30.0000 [IU] | PEN_INJECTOR | Freq: Every day | SUBCUTANEOUS | Status: DC

## 2023-09-21 MED ORDER — CARVEDILOL 25 MG PO TABS
25.0000 mg | ORAL_TABLET | Freq: Two times a day (BID) | ORAL | Status: DC
  Administered 2023-09-21 – 2023-09-26 (×12): 25 mg via ORAL
  Filled 2023-09-21 (×3): qty 1
  Filled 2023-09-21: qty 2
  Filled 2023-09-21 (×8): qty 1

## 2023-09-21 MED ORDER — HEPARIN SODIUM (PORCINE) 1000 UNIT/ML IJ SOLN
INTRAMUSCULAR | Status: DC | PRN
  Administered 2023-09-21: 5000 [IU] via INTRA_ARTERIAL

## 2023-09-21 MED ORDER — HYDRALAZINE HCL 20 MG/ML IJ SOLN
INTRAMUSCULAR | Status: AC
  Filled 2023-09-21: qty 1

## 2023-09-21 MED ORDER — VERAPAMIL HCL 2.5 MG/ML IV SOLN
INTRAVENOUS | Status: AC
Start: 2023-09-21 — End: ?
  Filled 2023-09-21: qty 2

## 2023-09-21 MED ORDER — ASPIRIN 81 MG PO CHEW
81.0000 mg | CHEWABLE_TABLET | Freq: Every day | ORAL | Status: DC
  Administered 2023-09-22 – 2023-09-26 (×5): 81 mg via ORAL
  Filled 2023-09-21 (×5): qty 1

## 2023-09-21 MED ORDER — INSULIN GLARGINE-YFGN 100 UNIT/ML ~~LOC~~ SOLN
30.0000 [IU] | Freq: Every day | SUBCUTANEOUS | Status: DC
  Filled 2023-09-21: qty 0.3

## 2023-09-21 NOTE — ED Notes (Signed)
 Cardiology team at bedside

## 2023-09-21 NOTE — Interval H&P Note (Signed)
 History and Physical Interval Note:  09/21/2023 3:50 PM  [name]  has presented today for surgery, with the diagnosis of nstemi.  The various methods of treatment have been discussed with the patient and family. After consideration of risks, benefits and other options for treatment, the patient has consented to  Procedure(s): LEFT HEART CATH AND CORONARY ANGIOGRAPHY (N/A) as a surgical intervention.  The patient's history has been reviewed, patient examined, no change in status, stable for surgery.  I have reviewed the patient's chart and labs.  Questions were answered to the patient's satisfaction.     Kinsie Belford K Carlos Quackenbush

## 2023-09-21 NOTE — Assessment & Plan Note (Signed)
 Initial blood pressure of 204/133, downtrending with initiation of (irbesartan , carvedilol , hydrochlorothiazide , amlodipine  and nitroglycerin ).  Discontinue hydrochlorothiazide  per cardiology recommendations with CKD. - Continue irbesartan , carvedilol , amlodipine 

## 2023-09-21 NOTE — Progress Notes (Addendum)
 Report and care assumed for PT. Pt assessed. TR band assessed with off going Conservation officer, historic buildings and with charge RN Abe Abed. No bleeding at the site. 3 cc's of air removed at 1941. Pt chest pain is decreased from 4/10 to 2/10. Pt provided a urinal  and is able to urinate without assistance. No acute distress at this time. Vitals and assessment to continue Q 15 minutes until the TR band is removed.   No evidence of bleeding noted at the site. Arterial waveform noted.  All 15 cc's of air is removed from TR Band. Reverse Barbeau test performed at each interval. Radial artery is patent and hemostasis is achieved. Charge RN Abe Abed to assess for verification. Pt denies any pain at site or acute distress.   TR Band r removed at this time. No bleeding or hematoma noted. CP 5/10.   MD called to report CP. Order for stat EKG is done.   Increased trop to 9751 from 4189 called to Dr. Annabell Key and paged on call Cards. No new orders at this time.  Return call from On call Cards MD and Dr. Annabell Key. Orders to continue nitroglycerin   for CP. Drip is currently infusing at 40 ml/hr titrated down from 50 ml/hr. BP is 107/67. Chest pain is 2/10.  Nitroglycerin  stopped at 0720 pt states he is chest pain free.

## 2023-09-21 NOTE — ED Notes (Signed)
 Secretary paged cardiology.

## 2023-09-21 NOTE — H&P (Signed)
 Hospital Admission History and Physical Service Pager: [phone]  Patient name: [name] Medical record number: [id number] Date of Birth: [birth year] Age: 73 y.o. Gender: male  Primary Care Provider: Clem Currier, DO Consultants: Cardiology Code Status: FULL CODE discussed with patient Preferred Emergency Contact: Wife Contact Information     Name Relation Home Work Mobile   Kirker,[name] Spouse   [phone]      Other Contacts   None on File    Chief Complaint: Chest pain  Assessment and Plan: [name] is a 73 y.o. male presenting with chest pain and elevated troponins. Differential for presentation of this includes: NSTEMI (Elevated troponin, high-risk), cardiomyopathy, GERD, MSK pain. Lower concern for dissection (symmetrical pulses, stable vitals, no back pain), lower concern for PE (normal WOB, normal SPO2).  Assessment & Plan NSTEMI (non-ST elevated myocardial infarction) (HCC) Elevated troponin, high risk with uncontrolled co-morbidities, hx of stent + cardiomyopathy. Continues to have chest pain. - Admit to FM TS, progressive, attending Dr. Arita Belch - Cardiology consulted, greatly [facility] recommendations - Pending stat echocardiogram - Trending troponins - Continue nitro drip and heparin drip - N.p.o., sips with meds in anticipation of cath - A.m. BMP, CBC Type 2 diabetes mellitus with diabetic chronic kidney disease (HCC) Poorly controlled, last A1c greater than 15.  Not compliant with home insulin . - Start moderate sliding scale - Start basal insulin  at 30 units at night (half home dose) - Titrate as necessary Essential hypertension, benign Initial blood pressure of 204/133, downtrending with initiation of (irbesartan, carvedilol , hydrochlorothiazide , amlodipine  and nitroglycerin ).  Discontinue hydrochlorothiazide  per cardiology recommendations with CKD. - Continue irbesartan, carvedilol , amlodipine  CKD (chronic kidney disease) CKDIII, creatinine stable.   Euvolemic on exam today. - Avoid nephrotoxic agents if able  Chronic health problems: HLD: Continue Crestor  40 mg  FEN/GI: N.p.o. VTE Prophylaxis: Currently on full dose anticoagulation with heparin  Disposition: Progressive  History of Present Illness:  [name] is a 73 y.o. male presenting with   Has had worsening chest pain for the past several weeks.  Pain is primarily in lower chest and epigastric region, described as a burning pain.  Does SOB with ambulation that usually improves with rest.  For the past 2 days he has had constant chest pain has not resolved.   In the ED, workup was initiated.  Found of elevated troponins, CXR unremarkable.  Patient was started on ASA, nitro, heparin for NSTEMI.  Cardiology consulted, recommended adding amlodipine  for blood pressure control.  Cardiology recommending stat echo with consideration of catheter for NSTEMI. FMTS paged for admission  Review Of Systems: Per HPI  Pertinent Past Medical History:  Remainder reviewed in history tab.   Pertinent Past Surgical History:  Remainder reviewed in history tab.   Pertinent Social History: Tobacco use: None Alcohol use: Denies Other Substance use: Denies Lives with spouse  Pertinent Family History: Family history of myocardial infarction's, strokes and kidney disease  Remainder reviewed in history tab.   Important Outpatient Medications: Not compliant with medications - Carvedilol  25 mg twice daily - Inspiratory 5 mg daily - Aspirin  81 mg daily - Crestor  40 mg daily - Metformin  500 mg twice daily - Insulin  aspart 40 units before dinner - Insulin  glargine 65 units daily  Remainder reviewed in medication history.   Objective: BP (!) 199/114   Pulse (!) 113   Temp 99 F (37.2 C)   Resp (!) 24   Ht 5\' 4"  (1.626 m)   Wt 97.1 kg   SpO2  100%   BMI 36.73 kg/m  Exam: General: Chronically ill-appearing, resting comfortably in bed Eyes: EOM intact, normal conjunctiva Neck: F  ROM Cardiovascular: RRR, no murmurs.  Pain not reproducible on palpation.  Radial pulses equal, not bounding. Respiratory: CTAB Gastrointestinal: Not tender, protuberant but not distended, bowel sounds present. Derm: Warm and dry  Labs:  CBC BMET  Recent Labs  Lab 09/21/23 0757  WBC 9.0  HGB 14.2  HCT 43.6  PLT 315   Recent Labs  Lab 09/21/23 0757  NA 137  K 3.5  CL 104  CO2 23  BUN 25*  CREATININE 1.54*  GLUCOSE 337*  CALCIUM  8.8*     EKG: Sinus, evidence of hypertrophy, similar to prior EKG in October, no STEMI nor equivalents visualized.  Imaging Studies Performed:  DG Chest 2 View Result Date: 09/21/2023 IMPRESSION: 1. Mild central congestion. 2. Possible 8 mm right lung pulmonary nodule. As this may represent artifact, consideration should be given toward repeat PA and lateral chest radiograph to include apical lordotic views which can be performed as an outpatient.   Nicholas Porteous, DO 09/21/2023, 10:58 AM PGY-2, [facility]  FPTS Intern pager: [phone], text pages welcome Secure chat group [facility] Teaching Service

## 2023-09-21 NOTE — Progress Notes (Signed)
 Pt arrived from ...cath.., A/ox 4...pt denies any pain, MD aware,CCMD called. CHG bath given,no further needs at this time

## 2023-09-21 NOTE — ED Provider Notes (Addendum)
 [place] [facility]PART[facility] AT [facility] Provider Note   CSN: [id number] Arrival date & time: 09/21/23  0747     History  Chief Complaint  Patient presents with   Chest Pain    [name] is a 73 y.o. male with past medical history of DM2, HTN, HLD, hx of CAD w/ stent, low EF HF presenting with complaint of chest pain. Reports chest pain started three days ago and has been constant. Worse with walking and associated with SHOB with walking, nausea and vomiting. Reports he has had some recent CP with exerting himself and feeling "sweaty" but this seems different. Locates pain to anterior upper chest, radiates to left jaw/neck. Is not worse with ROM or TTP.  Denies abdominal pain, fever, cough.    Chest Pain      Home Medications Prior to Admission medications   Medication Sig Start Date End Date Taking? Authorizing Provider  Accu-Chek Softclix Lancets lancets USE AS DIRECTED UP TO FOUR TIMES DAILY Patient not taking: Reported on 09/05/2023 06/10/19   Shirley, [place], DO  amLODipine  (NORVASC ) 10 MG tablet Take 1 tablet (10 mg total) by mouth daily for 15 days. 09/16/22   McDiarmid, Demetra Filter, MD  aspirin  EC 81 MG tablet Take 81 mg by mouth daily. Patient not taking: Reported on 08/10/2023    [provider]  BD PEN NEEDLE NANO 2ND GEN 32G X 4 MM MISC USE 3 TIMES DAILY AS NEEDED BETWEEN MEALS AND BEDTIME 01/25/23   Clem Currier, DO  blood glucose meter kit and supplies Dispense based on patient and insurance preference. Use up to four times daily as directed. (FOR ICD-10 E10.9, E11.9). Patient not taking: Reported on 09/05/2023 05/13/20   Lilland, Alana, DO  carvedilol  (COREG ) 25 MG tablet Take 1 tablet (25 mg total) by mouth 2 (two) times daily. 12/06/22 03/14/23  McDiarmid, Demetra Filter, MD  Continuous Glucose Sensor (FREESTYLE LIBRE 3 SENSOR) MISC 2 Devices by Does not apply route as needed. Place 1 sensor on the skin every 14 days. Use to check glucose continuously  12/22/22   McDiarmid, Demetra Filter, MD  eplerenone  (INSPRA ) 25 MG tablet Take 1 tablet (25 mg total) by mouth daily. 06/20/23   McDiarmid, Demetra Filter, MD  glucose blood (ACCU-CHEK GUIDE) test strip Use as instructed Patient not taking: Reported on 09/05/2023 05/21/20   Anibal Kent, MD  insulin  aspart (FIASP  FLEXTOUCH) 100 UNIT/ML FlexTouch Pen Inject 40 Units into the skin daily before supper. 07/25/23   McDiarmid, Demetra Filter, MD  Insulin  Glargine (BASAGLAR  KWIKPEN) 100 UNIT/ML DIAL AND INJECT 65 UNITS UNDER THE SKIN DAILY. 08/21/23   Clem Currier, DO  metFORMIN  (GLUCOPHAGE ) 500 MG tablet Take 1 tablet (500 mg total) by mouth 2 (two) times daily with a meal. 12/22/22   McDiarmid, Demetra Filter, MD  nitroGLYCERIN  (NITROSTAT ) 0.3 MG SL tablet Place 1 tablet (0.3 mg total) under the tongue every 5 (five) minutes as needed for chest pain. Patient not taking: Reported on 09/05/2023 08/17/23   Clem Currier, DO  rosuvastatin  (CRESTOR ) 40 MG tablet Take 1 tablet (40 mg total) by mouth daily. 03/14/23   McDiarmid, Demetra Filter, MD  valsartan -hydrochlorothiazide  (DIOVAN -HCT) 320-12.5 MG tablet Take 1 tablet by mouth daily. 12/06/22   McDiarmid, Demetra Filter, MD      Allergies    Trulicity [dulaglutide]    Review of Systems   Review of Systems  Cardiovascular:  Positive for chest pain.    Physical Exam Updated Vital Signs BP Aaron Aas)  204/133 (BP Location: Right Arm)   Pulse (!) 122   Temp 99 F (37.2 C)   Resp (!) 24   Ht 5\' 4"  (1.626 m)   Wt 97.1 kg   SpO2 95%   BMI 36.73 kg/m  Physical Exam Vitals and nursing note reviewed.  Constitutional:      General: He is not in acute distress.    Appearance: He is not toxic-appearing.  HENT:     Head: Normocephalic and atraumatic.  Eyes:     General: No scleral icterus.    Conjunctiva/sclera: Conjunctivae normal.  Cardiovascular:     Rate and Rhythm: Regular rhythm. Tachycardia present.     Pulses: Normal pulses.     Heart sounds: Normal heart sounds.  Pulmonary:     Effort:  Pulmonary effort is normal. No respiratory distress.     Breath sounds: Normal breath sounds.  Chest:     Chest wall: No tenderness.  Abdominal:     General: Abdomen is flat. Bowel sounds are normal.     Palpations: Abdomen is soft.     Tenderness: There is no abdominal tenderness.  Musculoskeletal:     Right lower leg: No edema.     Left lower leg: No edema.  Skin:    General: Skin is warm and dry.     Findings: No lesion.  Neurological:     General: No focal deficit present.     Mental Status: He is alert and oriented to person, place, and time. Mental status is at baseline.     ED Results / Procedures / Treatments   Labs (all labs ordered are listed, but only abnormal results are displayed) Labs Reviewed  BASIC METABOLIC PANEL WITH GFR - Abnormal; Notable for the following components:      Result Value   Glucose, Bld 337 (*)    BUN 25 (*)    Creatinine, Ser 1.54 (*)    Calcium  8.8 (*)    GFR, Estimated 47 (*)    All other components within normal limits  BRAIN NATRIURETIC PEPTIDE - Abnormal; Notable for the following components:   B Natriuretic Peptide 128.5 (*)    All other components within normal limits  HEPATIC FUNCTION PANEL - Abnormal; Notable for the following components:   Albumin 2.9 (*)    All other components within normal limits  TROPONIN I (HIGH SENSITIVITY) - Abnormal; Notable for the following components:   Troponin I (High Sensitivity) 323 (*)    All other components within normal limits  TROPONIN I (HIGH SENSITIVITY) - Abnormal; Notable for the following components:   Troponin I (High Sensitivity) 272 (*)    All other components within normal limits  CBC  LIPASE, BLOOD  HEPARIN  LEVEL (UNFRACTIONATED)  TROPONIN I (HIGH SENSITIVITY)    EKG EKG Interpretation Date/Time:  Thursday Sep 21 2023 10:59:24 EDT Ventricular Rate:  107 PR Interval:  175 QRS Duration:  141 QT Interval:  357 QTC Calculation: 477 R Axis:   -1  Text  Interpretation: Sinus tachycardia Left bundle branch block No significant change since last tracing EARLIER SAME DATE Confirmed by Auston Blush [phone]) on 09/21/2023 11:06:07 AM  Radiology DG Chest 2 View Result Date: 09/21/2023 CLINICAL DATA:  Chest pain EXAM: CHEST - 2 VIEW COMPARISON:  Chest x-ray performed January 22, 2022 FINDINGS: Normal size heart. Elevated right hemidiaphragm, similar. No discrete lobar infiltrate. No pleural effusion or pneumothorax. Mild central congestion. An 8 mm nodule is present overlying the right lung. This may  represent artifact secondary to overlying lead. IMPRESSION: 1. Mild central congestion. 2. Possible 8 mm right lung pulmonary nodule. As this may represent artifact, consideration should be given toward repeat PA and lateral chest radiograph to include apical lordotic views which can be performed as an outpatient. Electronically Signed   By: Reagan Camera M.D.   On: 09/21/2023 08:46    Procedures .Critical Care  Performed by: Eudora Heron, PA-C Authorized by: Eudora Heron, PA-C   Critical care provider statement:    Critical care time (minutes):  40   Critical care time was exclusive of:  Separately billable procedures and treating other patients   Critical care was necessary to treat or prevent imminent or life-threatening deterioration of the following conditions:  Cardiac failure, circulatory failure, CNS failure or compromise and dehydration   Critical care was time spent personally by me on the following activities:  Blood draw for specimens, ordering and performing treatments and interventions, ordering and review of laboratory studies, ordering and review of radiographic studies, pulse oximetry, re-evaluation of patient's condition, review of old charts and discussions with consultants   Care discussed with: admitting provider       Medications Ordered in ED Medications - No data to display  ED Course/ Medical Decision Making/  A&P Clinical Course as of 09/21/23 1159  Thu Sep 21, 2023  10[address] [facility] cardiology who will consult on patient.  [JB]    Clinical Course User Index [JB] Yolanda Dockendorf, Kandace Organ, PA-C                                 Medical Decision Making Amount and/or Complexity of Data Reviewed Labs: ordered. Radiology: ordered.  Risk OTC drugs. Decision regarding hospitalization.   This patient presents to the ED for concern of CP, this involves an extensive number of treatment options, and is a complaint that carries with it a high risk of complications and morbidity.  The differential diagnosis includes ACS, GERD, pe, pna, aortic dissection, pneumothorax, MSK path, anemia, esophageal rupture, CHF exacerbation, valvular disorder, myocarditis, pericarditis, endocarditis, pericardial effusion/cardiac tamponade, pulmonary edema, gastritis/PUD, esophagitis.   Co morbidities that complicate the patient evaluation  DM2, HTN, HLD, hx of CAD w/ stent, low EF HF   Additional history obtained:  Additional history obtained from [facility] 09/05/23 in which patient was seen for chest pain. Reviewed echo most recent 2023 w/ EF 45-50%. Had stent in 2005.    Lab Tests:  I personally interpreted labs.  The pertinent results include:   CBC without leukocytosis.  No significant anemia.  BMP shows creatinine of 1.5 BNP is mildly elevated at 128.  Initial troponin 300s.  Will repeat troponin.   Imaging Studies ordered:  I ordered imaging studies including chest x-ray  I independently visualized and interpreted imaging which showed mild congestion, pulm nodulde I agree with the radiologist interpretation Discussed with patient    Cardiac Monitoring: / EKG:  The patient was maintained on a cardiac monitor.  I personally viewed and interpreted the cardiac monitored which showed an underlying rhythm of: sinus tachycardia with probable LVH, no significant change since most recent EKG.    Consultations Obtained:  I  requested consultation with the cardiology,  and discussed lab and imaging findings as well as pertinent plan - they recommend: admit. Medicine to admit.    Problem List / ED Course / Critical interventions / Medication management  Patient presenting with chest  pain he is hypertensive and mildly tachycardic as well. He is stable and well appearing at this time. Patient has strong radial pulse equal bilaterally.  Symptoms do not seem consistent with aortic dissection. No evidence of DVT on exam. He has no evidence of pneumonia or pneumothorax on chest x-ray.  This has been ongoing for 2 days.  He has significant past medical history of diabetes hypertension hyperlipidemia.  He has not been noncompliant with his medications.  Troponin is elevated at 323. Continues to have CP thus feel symptoms are most consistent with NSTEMI versus Hypertensive urgency's.  I have given home blood pressure medication as well as started on IV nitro drip and heparin . Tried Malox without significant improvement in symptoms.  On reassessment he had mild improvement after starting IV nitro.  Will consult cardiology and admit to medicine. No recent cath, no recent echo.   I have reviewed the patients home medicines and have made adjustments as needed   Plan Medicine to admit.          Final Clinical Impression(s) / ED Diagnoses Final diagnoses:  NSTEMI (non-ST elevated myocardial infarction) [facility])    Rx / DC Orders ED Discharge Orders     None         Zoei Amison, Kandace Organ, PA-C 09/21/23 1203    Arnice Vanepps, Kandace Organ, PA-C 09/21/23 1453    Auston Blush, MD 09/25/23 [phone]

## 2023-09-21 NOTE — ED Notes (Signed)
 McDiarmid, MD notified of Troponin, 673.  Will page cardiology.

## 2023-09-21 NOTE — H&P (View-Only) (Signed)
 Cardiology Consultation   Patient ID: [name] MRN: [id number]; DOB: 05/22/1950  Admit date: 09/21/2023 Date of Consult: 09/21/2023  PCP:  Clem Currier, DO   [facility] Providers Cardiologist:  Hazle Lites, MD   {   Patient Profile:   [name] is a 73 y.o. male with a hx of coronary artery disease, diabetes mellitus, hypertension, hyperlipidemia, chronic renal insufficiency who is being seen 09/21/2023 for the evaluation of chest pain and elevated troponin at the request of Auston Blush, MD.  History of Present Illness:   By report patient had PCI of his LAD in 2005.  I do not have those records available.  Last echocardiogram October 2023 showed ejection fraction 45 to 50%, mild left ventricular hypertrophy, grade 1 diastolic dysfunction, mild RV dysfunction.  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  Patient presented with chest pain and troponin minimally elevated.  Cardiology now asked to evaluate.  Patient states that over the past several weeks he has had a burning pain in the epigastric area radiating to his chest and neck.  It occurs with exertion and is relieved with rest.  Patient states that his pain returned approximately 2 days ago and has not completely resolved since then.  Became more severe today and he presented to the emergency room for further evaluation.  Some improvement with IV nitroglycerin .  He also notes increased dyspnea on exertion but no orthopnea, PND or pedal edema.   Past Medical History:  Diagnosis Date   CAD (coronary artery disease)    Diabetes mellitus without complication (HCC)    Hypercholesterolemia    Hypertension    MI (myocardial infarction) (HCC)    Renal insufficiency     Past Surgical History:  Procedure Laterality Date   CORONARY ANGIOPLASTY WITH STENT PLACEMENT  08/01/2003   Proximal LAD     Inpatient Medications: Scheduled Meds:  amLODipine   5 mg Oral Daily   aspirin   325  mg Oral Daily   carvedilol   25 mg Oral BID   irbesartan  300 mg Oral Daily   Continuous Infusions:  heparin 950 Units/hr (09/21/23 1020)   nitroGLYCERIN  10 mcg/min (09/21/23 1015)   PRN Meds:   Allergies:    Allergies  Allergen Reactions   Trulicity [Dulaglutide] Other (See Comments)    Patient indicated this caused pancreatitis.     Social History:   Social History   Socioeconomic History   Marital status: Married    Spouse name: [name]   Number of children: 4   Years of education: 10   Highest education level: 10th grade  Occupational History   Occupation: retired  Tobacco Use   Smoking status: Former    Current packs/day: 0.00    Average packs/day: 0.5 packs/day for 10.0 years (5.0 ttl pk-yrs)    Types: Cigarettes    Start date: 04/26/1983    Quit date: 04/25/1993    Years since quitting: 30.4   Smokeless tobacco: Never  Vaping Use   Vaping status: Never Used  Substance and Sexual Activity   Alcohol use: No   Drug use: No   Sexual activity: Yes  Other Topics Concern   Not on file  Social History Narrative   Patient lives with his wife in [place].   Patent has 4 boys, one lives in [place] .    Patient enjoys fishing, camping in the woods, working outside, anything outdoor related.   Social Drivers of Corporate investment banker  Strain: Low Risk  (08/21/2023)   Overall Financial Resource Strain (CARDIA)    Difficulty of Paying Living Expenses: Not very hard  Food Insecurity: No Food Insecurity (08/21/2023)   Hunger Vital Sign    Worried About Running Out of Food in the Last Year: Never true    Ran Out of Food in the Last Year: Never true  Transportation Needs: No Transportation Needs (08/21/2023)   PRAPARE - Administrator, Civil Service (Medical): No    Lack of Transportation (Non-Medical): No  Physical Activity: Inactive (08/21/2023)   Exercise Vital Sign    Days of Exercise per Week: 0 days    Minutes of Exercise per Session: 0 min  Stress:  No Stress Concern Present (08/21/2023)   [employer] of Occupational Health - Occupational Stress Questionnaire    Feeling of Stress : Not at all  Social Connections: Socially Integrated (08/21/2023)   Social Connection and Isolation Panel [NHANES]    Frequency of Communication with Friends and Family: More than three times a week    Frequency of Social Gatherings with Friends and Family: More than three times a week    Attends Religious Services: More than 4 times per year    Active Member of [employer] or Organizations: Yes    Attends Banker Meetings: Never    Marital Status: Married  Catering manager Violence: Not At Risk (08/21/2023)   Humiliation, Afraid, Rape, and Kick questionnaire    Fear of Current or Ex-Partner: No    Emotionally Abused: No    Physically Abused: No    Sexually Abused: No    Family History:    Family History  Problem Relation Age of Onset   Heart disease Mother    Diabetes Mother    Hypertension Mother    Alcoholism Father    Heart disease Father    Hypertension Sister    Diabetes Brother    Hypercholesterolemia Brother    Osteoporosis Brother    Hypertension Brother    Kidney disease Brother      ROS:  Please see the history of present illness.  Patient denies fevers, chills, productive cough or hemoptysis. All other ROS reviewed and negative.     Physical Exam/Data:   Vitals:   09/21/23 0815 09/21/23 0938 09/21/23 1030 09/21/23 1109  BP: (!) 193/114 (!) 199/114 (!) 195/112 (!) 178/106  Pulse: (!) 108 (!) 113 (!) 112 (!) 102  Resp: 15 (!) 24 20 (!) 21  Temp:      SpO2: 100% 100% 99% 99%  Weight:      Height:       No intake or output data in the 24 hours ending 09/21/23 1120    09/21/2023    7:54 AM 09/05/2023    4:33 PM 09/05/2023    3:03 PM  Last 3 Weights  Weight (lbs) 214 lb 214 lb 9.6 oz 214 lb 9.6 oz  Weight (kg) 97.07 kg 97.342 kg 97.342 kg     Body mass index is 36.73 kg/m.  General:  Well nourished, well  developed, in no acute distress HEENT: normal Neck: no JVD Vascular: No carotid bruits; Distal pulses 2+ bilaterally Cardiac:  normal S1, S2; RRR; no murmur  Lungs:  clear to auscultation bilaterally, no wheezing, rhonchi or rales  Abd: soft, nontender, no hepatomegaly  Ext: no edema Musculoskeletal:  No deformities, BUE and BLE strength normal and equal Skin: warm and dry  Neuro:  CNs 2-12 intact, no focal abnormalities  noted Psych:  Normal affect   EKG:  The EKG was personally reviewed and demonstrates: Sinus rhythm, left ventricular hypertrophy, cannot rule out septal infarct, lateral T wave inversion. Telemetry:  Telemetry was personally reviewed and demonstrates: Sinus tachycardia   Laboratory Data:  High Sensitivity Troponin:   Recent Labs  Lab 09/21/23 0757  TROPONINIHS 323*     Chemistry Recent Labs  Lab 09/21/23 0757  NA 137  K 3.5  CL 104  CO2 23  GLUCOSE 337*  BUN 25*  CREATININE 1.54*  CALCIUM  8.8*  GFRNONAA 47*  ANIONGAP 10    Recent Labs  Lab 09/21/23 0757  PROT 6.8  ALBUMIN 2.9*  AST 15  ALT 15  ALKPHOS 64  BILITOT 0.6   Hematology Recent Labs  Lab 09/21/23 0757  WBC 9.0  RBC 4.92  HGB 14.2  HCT 43.6  MCV 88.6  MCH 28.9  MCHC 32.6  RDW 12.3  PLT 315     BNP Recent Labs  Lab 09/21/23 0806  BNP 128.5*       Radiology/Studies:  DG Chest 2 View Result Date: 09/21/2023 CLINICAL DATA:  Chest pain EXAM: CHEST - 2 VIEW COMPARISON:  Chest x-ray performed January 22, 2022 FINDINGS: Normal size heart. Elevated right hemidiaphragm, similar. No discrete lobar infiltrate. No pleural effusion or pneumothorax. Mild central congestion. An 8 mm nodule is present overlying the right lung. This may represent artifact secondary to overlying lead. IMPRESSION: 1. Mild central congestion. 2. Possible 8 mm right lung pulmonary nodule. As this may represent artifact, consideration should be given toward repeat PA and lateral chest radiograph to  include apical lordotic views which can be performed as an outpatient. Electronically Signed   By: Reagan Camera M.D.   On: 09/21/2023 08:46     Assessment and Plan:   NSTEMI-patient presents with symptoms that were initially concerning as he could describe chest pain with activities relieved with rest.  His pain has now been persistent for 2 consecutive days however.  His troponin is mildly elevated.  Will arrange echocardiogram to assess LV function.  He will likely require cardiac catheterization.  The risk and benefits including myocardial infarction, CVA and death discussed and he agrees to proceed.  He does have chronic stage IIIa kidney disease.  I would like to hydrate prior to procedure if possible.  Will check another troponin.  If it is trending much higher may need to proceed sooner rather than later.  Treated with aspirin , heparin , nitro and continue carvedilol .  Add Crestor  40 mg daily. Chronic stage IIIa kidney disease-will hydrate prior to catheterization.  Limit dye.  Follow renal function after procedure. Hypertension-patient's blood pressure is elevated.  Will continue carvedilol  and Avapro .  Discontinue HCTZ.  Continue nitroglycerin .  Add amlodipine  5 mg daily.  Follow blood pressure and adjust regimen as needed.  Note his history does not seem consistent with dissection. Hyperlipidemia-add Crestor  40 mg daily. Diabetes mellitus-managed by primary care. Possible 8 mm right lung nodule on chest x-ray.  Will need follow-up PA and lateral.  Addendum: Follow-up troponin 673 mildly elevated compared to previous.  Echocardiogram is technically difficult.  There is paradoxical septal motion likely from conduction abnormality.  His pain has been persistent (initially improved with nitroglycerin  now 7 out of 10).  Will plan to proceed with cardiac catheterization today to further assess.  Risk Assessment/Risk Scores:   TIMI Risk Score for Unstable Angina or Non-ST Elevation MI:   The  patient's TIMI risk score is  6, which indicates a 41% risk of all cause mortality, new or recurrent myocardial infarction or need for urgent revascularization in the next 14 days.{  For questions or updates, please contact [facility] Please consult [email or web address] for contact info under    Signed, Alexandria Angel, MD  09/21/2023 11:20 AM

## 2023-09-21 NOTE — Assessment & Plan Note (Signed)
 CKDIII, creatinine stable.  Euvolemic on exam today. - Avoid nephrotoxic agents if able

## 2023-09-21 NOTE — Assessment & Plan Note (Signed)
 Elevated troponin, high risk with uncontrolled co-morbidities, hx of stent + cardiomyopathy. Continues to have chest pain. - Admit to FM TS, progressive, attending Dr. Arita Belch - Cardiology consulted, greatly [facility] recommendations - Pending stat echocardiogram - Trending troponins - Continue nitro drip and heparin drip - N.p.o., sips with meds in anticipation of cath - A.m. BMP, CBC

## 2023-09-21 NOTE — ED Notes (Signed)
 MD Ray notified of critical lab value. Troponin 323.

## 2023-09-21 NOTE — Hospital Course (Addendum)
 History of Present Illness:  [name] is a 73 year old male with a past medical history of coronary artery disease (PCI of LAD in 2005 in [place] per patient but no records available), diastolic dysfunction, cardiomyopathy. uncontrolled diabetes mellitus, hypertension, hyperlipidemia, and chronic kidney disease stage IIIa. The patient saw a pharmacist on 05/13 at the pharmacy clinic and was noted to have atypical chest pain including burning chest pain, the use of nitroglycerin  was reviewed as well as when to call 911. He then presented to the ED on 05/29 with complaints of chest pain, nausea, vomiting, shortness of breath and neck pain that was not relieved with the use of nitroglycerin . He reports the chest pain started about 1 week prior and was constant. This worsened with exertion and he would become diaphoretic but this was more severe with radiation the the left jaw/neck and associated with nausea and vomiting. On arrival to the ED he was tachycardic and Troponin I (high sensitivity) peaked at 9,751. He was ruled in for NSTEMI and started on heparin . He underwent heart catheterization on 05/29 which showed 90% stenosis of the proximal, mid and distal RCA, 80-90% stenosis of OM1, OM2, and OM3, and 80-90% stenosis of the proximal to mid LAD including 80% stenosis of the lesion that was previously treated. His LVEDP was severely elevated at 38-54mmHg and he was given IV Lasix  with recommendations to aggressively diurese prior to surgery. He underwent echocardiogram on 09/21/23 which was technically difficulty but measured his LVEF 35-40% with moderately decreased function of the left ventricle and global hypokinesis, he did have aortic valve sclerosis without evidence of aortic stenosis, no other valve abnormalities were noted.  His diabetes is poorly controlled.  He admits to non-compliance stating he doesn't take his medication when he feels good.    Hospital Course:  Cardiothoracic surgical consultation was  requested.  The patient was evaluated by Dr. Kerrin who was in agreement he would benefit from coronary bypass grafting.  He did feel patient would benefit from additional diuresis and better control of his blood sugars.  The risks and benefits of the procedure were explained to the patient and he was agreeable to proceed.  He was taken to the operating room on 09/27/2023.  He underwent CABG x 4 utilizing LIMA to LAD, SVG to OM1 sequential to OM3, and SVG to PDA, as well as endoscopic harvest of the right greater saphenous vein. He tolerated the procedure well and was transferred to the SICU in stable condition. He was extubated without difficulty on POD1. He required epinephrine  and milrinone  for hemodynamic support, this was weaned as hemodynamics tolerated. Critical care was consulted and assisted weaning off milrinone . He has expected postoperative blood loss anemia and was transfused with 1U PRBCs. Epicardial pacing wires were removed on POD2 without complication. He did developed a postoperative ileus, reglan  was started and NG tube was placed.  He was initiated on CRRT to help with volume management.  He remained on Milrinone  and Epinephrine  on 6/9. Ileus began resolving and he was started on a renal diet on POD6. He was started on Plavix  for hx of NSTEMI. Milrinone  was discontinued on 06/11. CRRT was discontinued on 06/11.  Nephrology followed the patient closely.  He was making some urine. Nephrology was following and he was not felt to require hemodialysis. PT/OT evaluations recommended CIR, they were following closely for possible admission. Low dose Coreg  was started.  His blood sugars were not well controlled and his insulin  regimen was increased  as indicated.  He was felt stable for transfer to the progressive unit on 06/16. His creatinine was improving and urine output increased, nephrology determined he would not require hemodialysis and removed his dialysis catheter. He was started on Bidil  per  AHF team. He had acute expected postoperative blood loss anemia that was trending down, he was transfused with 1U PRBCs. Hemoccult was negative. His iron  and iron  saturation were low, he was given IV iron  prior to discharge and was started on a PO iron  supplement at discharge. He was started on Torsemide  daily per AHF. He was ambulating well and saturating well on room air. His incisions were healing well without sign of infection. He was felt stable for discharge home.

## 2023-09-21 NOTE — ED Triage Notes (Signed)
 Patient complains chest pain, nausea vomiting, sob, neck pain that has been intermittent and has taken nitroglycerin  with no relief.Patient is very hypertensive and tachy  in triage.

## 2023-09-21 NOTE — Progress Notes (Signed)
 PHARMACY - ANTICOAGULATION CONSULT NOTE  Pharmacy Consult for heparin Indication: chest pain/ACS  Allergies  Allergen Reactions   Trulicity [Dulaglutide] Other (See Comments)    Patient indicated this caused pancreatitis.     Patient Measurements: Height: 5\' 4"  (162.6 cm) Weight: 97.1 kg (214 lb) IBW/kg (Calculated) : 59.2 HEPARIN DW (KG): 80.9  Vital Signs: Temp: 99 F (37.2 C) (05/29 0751) BP: 193/114 (05/29 0815) Pulse Rate: 108 (05/29 0815)  Labs: Recent Labs    09/21/23 0757  HGB 14.2  HCT 43.6  PLT 315  CREATININE 1.54*  TROPONINIHS 323*    Estimated Creatinine Clearance: 45 mL/min (A) (by C-G formula based on SCr of 1.54 mg/dL (H)).   Medical History: Past Medical History:  Diagnosis Date   Diabetes mellitus without complication (HCC)    Hypercholesterolemia    Hypertension    MI (myocardial infarction) [facility])     Assessment: 73 yo M presenting with chest pain without relief after taking nitroglycerin . Not on anticoagulation PTA. Pharmacy consulted to start heparin.   CBC WNL - hgb 14.2 and plts 315.   Goal of Therapy:  Heparin level 0.3-0.7 units/ml Monitor platelets by anticoagulation protocol: Yes   Plan:  Give 4,000 units bolus x 1 Start heparin infusion at 950 units/hr Check anti-Xa level in 8 hours and daily while on heparin Continue to monitor H&H and platelets  Adaline Ada, PharmD PGY1 Pharmacy Resident 09/21/2023 9:38 AM

## 2023-09-21 NOTE — Progress Notes (Signed)
 PHARMACY - ANTICOAGULATION CONSULT NOTE  Pharmacy Consult for heparin Indication: chest pain/ACS  Allergies  Allergen Reactions   Trulicity [Dulaglutide] Other (See Comments)    Patient indicated this caused pancreatitis.     Patient Measurements: Height: 5\' 4"  (162.6 cm) Weight: 97.1 kg (214 lb) IBW/kg (Calculated) : 59.2 HEPARIN DW (KG): 80.9  Vital Signs: Temp: 98.8 F (37.1 C) (05/29 2000) Temp Source: Oral (05/29 2000) BP: 137/83 (05/29 2047) Pulse Rate: 91 (05/29 2021)  Labs: Recent Labs    09/21/23 0757 09/21/23 0957 09/21/23 1218 09/21/23 1943  HGB 14.2  --   --   --   HCT 43.6  --   --   --   PLT 315  --   --   --   HEPARINUNFRC  --   --   --  0.13*  CREATININE 1.54*  --   --   --   TROPONINIHS 323* 272* 673* 4,189*    Estimated Creatinine Clearance: 45 mL/min (A) (by C-G formula based on SCr of 1.54 mg/dL (H)).    Assessment: 73 yo M presenting with chest pain without relief after taking nitroglycerin . Patient was started on IV heparin for ACS.  Patient is s/p cath on 5/29, found to have severe multivessel disease and CVTS consulted.  Pharmacy consulted to resume IV heparin 2 hours post radial band removal.  Radial band to be removed within the next several minutes per RN.  Goal of Therapy:  Heparin level 0.3-0.7 units/ml Monitor platelets by anticoagulation protocol: Yes   Plan:  At 2345, resume IV heparin at 950 units/hr Check 8 hr heparin level Daily heparin level and CBC F/u CVTS consult RN will notify us  should there be bleeding or hematoma  Omauri Boeve D. Marikay Show, PharmD, BCPS, BCCCP 09/21/2023, 9:33 PM

## 2023-09-21 NOTE — Consult Note (Addendum)
 Cardiology Consultation   Patient ID: Irving Bloor MRN: [id number]; DOB: Mar 26, 1951  Admit date: 09/21/2023 Date of Consult: 09/21/2023  PCP:  Clem Currier, DO   [facility] Providers Cardiologist:  Hazle Lites, MD   {   Patient Profile:   Augusten Lipkin is a 73 y.o. male with a hx of coronary artery disease, diabetes mellitus, hypertension, hyperlipidemia, chronic renal insufficiency who is being seen 09/21/2023 for the evaluation of chest pain and elevated troponin at the request of Auston Blush, MD.  History of Present Illness:   By report patient had PCI of his LAD in 2005.  I do not have those records available.  Last echocardiogram October 2023 showed ejection fraction 45 to 50%, mild left ventricular hypertrophy, grade 1 diastolic dysfunction, mild RV dysfunction.  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  Patient presented with chest pain and troponin minimally elevated.  Cardiology now asked to evaluate.  Patient states that over the past several weeks he has had a burning pain in the epigastric area radiating to his chest and neck.  It occurs with exertion and is relieved with rest.  Patient states that his pain returned approximately 2 days ago and has not completely resolved since then.  Became more severe today and he presented to the emergency room for further evaluation.  Some improvement with IV nitroglycerin .  He also notes increased dyspnea on exertion but no orthopnea, PND or pedal edema.   Past Medical History:  Diagnosis Date   CAD (coronary artery disease)    Diabetes mellitus without complication (HCC)    Hypercholesterolemia    Hypertension    MI (myocardial infarction) (HCC)    Renal insufficiency     Past Surgical History:  Procedure Laterality Date   CORONARY ANGIOPLASTY WITH STENT PLACEMENT  08/01/2003   Proximal LAD     Inpatient Medications: Scheduled Meds:  amLODipine   5 mg Oral Daily   aspirin   325  mg Oral Daily   carvedilol   25 mg Oral BID   irbesartan   300 mg Oral Daily   Continuous Infusions:  heparin  950 Units/hr (09/21/23 1020)   nitroGLYCERIN  10 mcg/min (09/21/23 1015)   PRN Meds:   Allergies:    Allergies  Allergen Reactions   Trulicity [Dulaglutide] Other (See Comments)    Patient indicated this caused pancreatitis.     Social History:   Social History   Socioeconomic History   Marital status: Married    Spouse name: Bartholomew Light   Number of children: 4   Years of education: 10   Highest education level: 10th grade  Occupational History   Occupation: retired  Tobacco Use   Smoking status: Former    Current packs/day: 0.00    Average packs/day: 0.5 packs/day for 10.0 years (5.0 ttl pk-yrs)    Types: Cigarettes    Start date: 04/26/1983    Quit date: 04/25/1993    Years since quitting: 30.4   Smokeless tobacco: Never  Vaping Use   Vaping status: Never Used  Substance and Sexual Activity   Alcohol use: No   Drug use: No   Sexual activity: Yes  Other Topics Concern   Not on file  Social History Narrative   Patient lives with his wife in [place].   Patent has 4 boys, one lives in [place] .    Patient enjoys fishing, camping in the woods, working outside, anything outdoor related.   Social Drivers of Corporate investment banker  Strain: Low Risk  (08/21/2023)   Overall Financial Resource Strain (CARDIA)    Difficulty of Paying Living Expenses: Not very hard  Food Insecurity: No Food Insecurity (08/21/2023)   Hunger Vital Sign    Worried About Running Out of Food in the Last Year: Never true    Ran Out of Food in the Last Year: Never true  Transportation Needs: No Transportation Needs (08/21/2023)   PRAPARE - Administrator, Civil Service (Medical): No    Lack of Transportation (Non-Medical): No  Physical Activity: Inactive (08/21/2023)   Exercise Vital Sign    Days of Exercise per Week: 0 days    Minutes of Exercise per Session: 0 min  Stress:  No Stress Concern Present (08/21/2023)   [employer] of Occupational Health - Occupational Stress Questionnaire    Feeling of Stress : Not at all  Social Connections: Socially Integrated (08/21/2023)   Social Connection and Isolation Panel [NHANES]    Frequency of Communication with Friends and Family: More than three times a week    Frequency of Social Gatherings with Friends and Family: More than three times a week    Attends Religious Services: More than 4 times per year    Active Member of [employer] or Organizations: Yes    Attends Banker Meetings: Never    Marital Status: Married  Catering manager Violence: Not At Risk (08/21/2023)   Humiliation, Afraid, Rape, and Kick questionnaire    Fear of Current or Ex-Partner: No    Emotionally Abused: No    Physically Abused: No    Sexually Abused: No    Family History:    Family History  Problem Relation Age of Onset   Heart disease Mother    Diabetes Mother    Hypertension Mother    Alcoholism Father    Heart disease Father    Hypertension Sister    Diabetes Brother    Hypercholesterolemia Brother    Osteoporosis Brother    Hypertension Brother    Kidney disease Brother      ROS:  Please see the history of present illness.  Patient denies fevers, chills, productive cough or hemoptysis. All other ROS reviewed and negative.     Physical Exam/Data:   Vitals:   09/21/23 0815 09/21/23 0938 09/21/23 1030 09/21/23 1109  BP: (!) 193/114 (!) 199/114 (!) 195/112 (!) 178/106  Pulse: (!) 108 (!) 113 (!) 112 (!) 102  Resp: 15 (!) 24 20 (!) 21  Temp:      SpO2: 100% 100% 99% 99%  Weight:      Height:       No intake or output data in the 24 hours ending 09/21/23 1120    09/21/2023    7:54 AM 09/05/2023    4:33 PM 09/05/2023    3:03 PM  Last 3 Weights  Weight (lbs) 214 lb 214 lb 9.6 oz 214 lb 9.6 oz  Weight (kg) 97.07 kg 97.342 kg 97.342 kg     Body mass index is 36.73 kg/m.  General:  Well nourished, well  developed, in no acute distress HEENT: normal Neck: no JVD Vascular: No carotid bruits; Distal pulses 2+ bilaterally Cardiac:  normal S1, S2; RRR; no murmur  Lungs:  clear to auscultation bilaterally, no wheezing, rhonchi or rales  Abd: soft, nontender, no hepatomegaly  Ext: no edema Musculoskeletal:  No deformities, BUE and BLE strength normal and equal Skin: warm and dry  Neuro:  CNs 2-12 intact, no focal abnormalities  noted Psych:  Normal affect   EKG:  The EKG was personally reviewed and demonstrates: Sinus rhythm, left ventricular hypertrophy, cannot rule out septal infarct, lateral T wave inversion. Telemetry:  Telemetry was personally reviewed and demonstrates: Sinus tachycardia   Laboratory Data:  High Sensitivity Troponin:   Recent Labs  Lab 09/21/23 0757  TROPONINIHS 323*     Chemistry Recent Labs  Lab 09/21/23 0757  NA 137  K 3.5  CL 104  CO2 23  GLUCOSE 337*  BUN 25*  CREATININE 1.54*  CALCIUM  8.8*  GFRNONAA 47*  ANIONGAP 10    Recent Labs  Lab 09/21/23 0757  PROT 6.8  ALBUMIN 2.9*  AST 15  ALT 15  ALKPHOS 64  BILITOT 0.6   Hematology Recent Labs  Lab 09/21/23 0757  WBC 9.0  RBC 4.92  HGB 14.2  HCT 43.6  MCV 88.6  MCH 28.9  MCHC 32.6  RDW 12.3  PLT 315     BNP Recent Labs  Lab 09/21/23 0806  BNP 128.5*       Radiology/Studies:  DG Chest 2 View Result Date: 09/21/2023 CLINICAL DATA:  Chest pain EXAM: CHEST - 2 VIEW COMPARISON:  Chest x-ray performed January 22, 2022 FINDINGS: Normal size heart. Elevated right hemidiaphragm, similar. No discrete lobar infiltrate. No pleural effusion or pneumothorax. Mild central congestion. An 8 mm nodule is present overlying the right lung. This may represent artifact secondary to overlying lead. IMPRESSION: 1. Mild central congestion. 2. Possible 8 mm right lung pulmonary nodule. As this may represent artifact, consideration should be given toward repeat PA and lateral chest radiograph to  include apical lordotic views which can be performed as an outpatient. Electronically Signed   By: Reagan Camera M.D.   On: 09/21/2023 08:46     Assessment and Plan:   NSTEMI-patient presents with symptoms that were initially concerning as he could describe chest pain with activities relieved with rest.  His pain has now been persistent for 2 consecutive days however.  His troponin is mildly elevated.  Will arrange echocardiogram to assess LV function.  He will likely require cardiac catheterization.  The risk and benefits including myocardial infarction, CVA and death discussed and he agrees to proceed.  He does have chronic stage IIIa kidney disease.  I would like to hydrate prior to procedure if possible.  Will check another troponin.  If it is trending much higher may need to proceed sooner rather than later.  Treated with aspirin , heparin , nitro and continue carvedilol .  Add Crestor  40 mg daily. Chronic stage IIIa kidney disease-will hydrate prior to catheterization.  Limit dye.  Follow renal function after procedure. Hypertension-patient's blood pressure is elevated.  Will continue carvedilol  and Avapro .  Discontinue HCTZ.  Continue nitroglycerin .  Add amlodipine  5 mg daily.  Follow blood pressure and adjust regimen as needed.  Note his history does not seem consistent with dissection. Hyperlipidemia-add Crestor  40 mg daily. Diabetes mellitus-managed by primary care. Possible 8 mm right lung nodule on chest x-ray.  Will need follow-up PA and lateral.  Addendum: Follow-up troponin 673 mildly elevated compared to previous.  Echocardiogram is technically difficult.  There is paradoxical septal motion likely from conduction abnormality.  His pain has been persistent (initially improved with nitroglycerin  now 7 out of 10).  Will plan to proceed with cardiac catheterization today to further assess.  Risk Assessment/Risk Scores:   TIMI Risk Score for Unstable Angina or Non-ST Elevation MI:   The  patient's TIMI risk score is  6, which indicates a 41% risk of all cause mortality, new or recurrent myocardial infarction or need for urgent revascularization in the next 14 days.{  For questions or updates, please contact [facility] Please consult [email or web address] for contact info under    Signed, Alexandria Angel, MD  09/21/2023 11:20 AM

## 2023-09-21 NOTE — Plan of Care (Signed)
 FMTS Brief Progress Note  S: Patient reports feeling well following his catheterization this morning.  We informed the patient of the results of his cath as well as the next steps including that he would be hearing from the cardiothoracic surgeon likely tomorrow morning.   O: BP 130/80 (BP Location: Left Arm)   Pulse 90   Temp 98.8 F (37.1 C) (Oral)   Resp 20   Ht 5\' 4"  (1.626 m)   Wt 97.1 kg   SpO2 99%   BMI 36.73 kg/m   General: Well-appearing elderly gentleman, no distress Respiratory: CTAB, no increased work of breathing Cardiac: RRR, no M/R/G Extremities: No evidence of peripheral edema, 2+ pulses in the bilateral lower extremity.  A/P: Severe global coronary artery stenosis Patient to be evaluated by CT surgery tomorrow.  Continuing with heparin and nitroglycerin  gtt tonight.  TR band from catheterization care completed by nursing.  All of patient's questions were answered to his satisfaction. - Orders reviewed. Labs for AM ordered, which was adjusted as needed.  - If condition changes, plan includes rapid reassessment, immediate call to CT surgery depending on complaint.  Patient understands the severity of his condition and has been instructed to inform nursing of any change in his overall status.   Rayma Calandra, DO 09/21/2023, 8:14 PM PGY-1, [facility] Night Resident  Please page [phone] with questions.

## 2023-09-21 NOTE — Assessment & Plan Note (Signed)
 Poorly controlled, last A1c greater than 15.  Not compliant with home insulin . - Start moderate sliding scale - Start basal insulin  at 30 units at night (half home dose) - Titrate as necessary

## 2023-09-22 ENCOUNTER — Inpatient Hospital Stay (HOSPITAL_COMMUNITY)

## 2023-09-22 ENCOUNTER — Telehealth (HOSPITAL_COMMUNITY): Payer: Self-pay

## 2023-09-22 ENCOUNTER — Other Ambulatory Visit (HOSPITAL_COMMUNITY): Payer: Self-pay

## 2023-09-22 DIAGNOSIS — I255 Ischemic cardiomyopathy: Secondary | ICD-10-CM | POA: Diagnosis not present

## 2023-09-22 DIAGNOSIS — E1169 Type 2 diabetes mellitus with other specified complication: Secondary | ICD-10-CM | POA: Diagnosis not present

## 2023-09-22 DIAGNOSIS — I10 Essential (primary) hypertension: Secondary | ICD-10-CM | POA: Diagnosis not present

## 2023-09-22 DIAGNOSIS — Z789 Other specified health status: Secondary | ICD-10-CM

## 2023-09-22 DIAGNOSIS — I259 Chronic ischemic heart disease, unspecified: Secondary | ICD-10-CM | POA: Diagnosis not present

## 2023-09-22 DIAGNOSIS — I240 Acute coronary thrombosis not resulting in myocardial infarction: Secondary | ICD-10-CM | POA: Diagnosis not present

## 2023-09-22 DIAGNOSIS — N179 Acute kidney failure, unspecified: Secondary | ICD-10-CM | POA: Diagnosis not present

## 2023-09-22 DIAGNOSIS — Z0389 Encounter for observation for other suspected diseases and conditions ruled out: Secondary | ICD-10-CM | POA: Diagnosis not present

## 2023-09-22 DIAGNOSIS — I214 Non-ST elevation (NSTEMI) myocardial infarction: Secondary | ICD-10-CM | POA: Diagnosis not present

## 2023-09-22 DIAGNOSIS — E1165 Type 2 diabetes mellitus with hyperglycemia: Secondary | ICD-10-CM | POA: Diagnosis not present

## 2023-09-22 DIAGNOSIS — I2510 Atherosclerotic heart disease of native coronary artery without angina pectoris: Secondary | ICD-10-CM | POA: Diagnosis not present

## 2023-09-22 DIAGNOSIS — E1122 Type 2 diabetes mellitus with diabetic chronic kidney disease: Secondary | ICD-10-CM | POA: Diagnosis not present

## 2023-09-22 LAB — PULMONARY FUNCTION TEST
DL/VA % pred: 106 %
DL/VA: 4.36 ml/min/mmHg/L
DLCO unc % pred: 74 %
DLCO unc: 16.26 ml/min/mmHg
FEF 25-75 Post: 0.98 L/s
FEF 25-75 Pre: 1.49 L/s
FEF2575-%Change-Post: -34 %
FEF2575-%Pred-Post: 52 %
FEF2575-%Pred-Pre: 79 %
FEV1-%Change-Post: -12 %
FEV1-%Pred-Post: 54 %
FEV1-%Pred-Pre: 62 %
FEV1-Post: 1.38 L
FEV1-Pre: 1.59 L
FEV1FVC-%Change-Post: -3 %
FEV1FVC-%Pred-Pre: 109 %
FEV6-%Change-Post: -9 %
FEV6-%Pred-Post: 54 %
FEV6-%Pred-Pre: 60 %
FEV6-Post: 1.79 L
FEV6-Pre: 1.98 L
FEV6FVC-%Pred-Post: 107 %
FEV6FVC-%Pred-Pre: 107 %
FVC-%Change-Post: -9 %
FVC-%Pred-Post: 51 %
FVC-%Pred-Pre: 56 %
FVC-Post: 1.79 L
FVC-Pre: 1.98 L
Post FEV1/FVC ratio: 77 %
Post FEV6/FVC ratio: 100 %
Pre FEV1/FVC ratio: 80 %
Pre FEV6/FVC Ratio: 100 %
RV % pred: 45 %
RV: 1.01 L
TLC % pred: 51 %
TLC: 3.09 L

## 2023-09-22 LAB — CBC
HCT: 39.8 % (ref 39.0–52.0)
Hemoglobin: 12.9 g/dL — ABNORMAL LOW (ref 13.0–17.0)
MCH: 28.9 pg (ref 26.0–34.0)
MCHC: 32.4 g/dL (ref 30.0–36.0)
MCV: 89 fL (ref 80.0–100.0)
Platelets: 275 10*3/uL (ref 150–400)
RBC: 4.47 MIL/uL (ref 4.22–5.81)
RDW: 12.3 % (ref 11.5–15.5)
WBC: 9.4 10*3/uL (ref 4.0–10.5)
nRBC: 0 % (ref 0.0–0.2)

## 2023-09-22 LAB — BASIC METABOLIC PANEL WITH GFR
Anion gap: 9 (ref 5–15)
BUN: 25 mg/dL — ABNORMAL HIGH (ref 8–23)
CO2: 25 mmol/L (ref 22–32)
Calcium: 8.8 mg/dL — ABNORMAL LOW (ref 8.9–10.3)
Chloride: 103 mmol/L (ref 98–111)
Creatinine, Ser: 1.66 mg/dL — ABNORMAL HIGH (ref 0.61–1.24)
GFR, Estimated: 43 mL/min — ABNORMAL LOW (ref >60)
Glucose, Bld: 286 mg/dL — ABNORMAL HIGH (ref 70–99)
Potassium: 3.8 mmol/L (ref 3.5–5.1)
Sodium: 137 mmol/L (ref 135–145)

## 2023-09-22 LAB — HEPARIN LEVEL (UNFRACTIONATED)
Heparin Unfractionated: 0.16 [IU]/mL — ABNORMAL LOW (ref 0.30–0.70)
Heparin Unfractionated: 0.32 [IU]/mL (ref 0.30–0.70)

## 2023-09-22 LAB — HEMOGLOBIN A1C
Hgb A1c MFr Bld: 11.3 % — ABNORMAL HIGH (ref 4.8–5.6)
Mean Plasma Glucose: 277.61 mg/dL

## 2023-09-22 LAB — GLUCOSE, CAPILLARY
Glucose-Capillary: 248 mg/dL — ABNORMAL HIGH (ref 70–99)
Glucose-Capillary: 282 mg/dL — ABNORMAL HIGH (ref 70–99)
Glucose-Capillary: 184 mg/dL — ABNORMAL HIGH (ref 70–99)
Glucose-Capillary: 205 mg/dL — ABNORMAL HIGH (ref 70–99)

## 2023-09-22 LAB — TROPONIN I (HIGH SENSITIVITY)
Troponin I (High Sensitivity): 9751 ng/L (ref <18)
Troponin I (High Sensitivity): 9750 ng/L (ref <18)

## 2023-09-22 LAB — MAGNESIUM: Magnesium: 2.1 mg/dL (ref 1.7–2.4)

## 2023-09-22 MED ORDER — ACETAMINOPHEN 500 MG PO TABS: 1000.0000 mg | ORAL_TABLET | Freq: Four times a day (QID) | ORAL | Status: DC | PRN

## 2023-09-22 MED ORDER — CEFAZOLIN SODIUM-DEXTROSE 2-4 GM/100ML-% IV SOLN
2.0000 g | INTRAVENOUS | Status: DC
  Filled 2023-09-22: qty 100

## 2023-09-22 MED ORDER — VANCOMYCIN HCL 1250 MG/250ML IV SOLN
1250.0000 mg | INTRAVENOUS | Status: DC
  Filled 2023-09-22: qty 250

## 2023-09-22 MED ORDER — PLASMA-LYTE A IV SOLN
INTRAVENOUS | Status: DC
  Filled 2023-09-22: qty 2.5

## 2023-09-22 MED ORDER — LIDOCAINE 5 % EX PTCH
1.0000 | MEDICATED_PATCH | Freq: Every day | CUTANEOUS | Status: DC
  Administered 2023-09-22 – 2023-09-26 (×5): 1 via TRANSDERMAL
  Filled 2023-09-22 (×6): qty 1

## 2023-09-22 MED ORDER — TRANEXAMIC ACID (OHS) PUMP PRIME SOLUTION
2.0000 mg/kg | INTRAVENOUS | Status: DC
  Filled 2023-09-22: qty 1.94

## 2023-09-22 MED ORDER — PHENYLEPHRINE HCL-NACL 20-0.9 MG/250ML-% IV SOLN
30.0000 ug/min | INTRAVENOUS | Status: DC
  Filled 2023-09-22: qty 250

## 2023-09-22 MED ORDER — EPINEPHRINE HCL 5 MG/250ML IV SOLN IN NS
0.0000 ug/min | INTRAVENOUS | Status: DC
  Filled 2023-09-22: qty 250

## 2023-09-22 MED ORDER — INSULIN GLARGINE-YFGN 100 UNIT/ML ~~LOC~~ SOLN
40.0000 [IU] | Freq: Every day | SUBCUTANEOUS | Status: DC
  Filled 2023-09-22: qty 0.4

## 2023-09-22 MED ORDER — TRANEXAMIC ACID 1000 MG/10ML IV SOLN
1.5000 mg/kg/h | INTRAVENOUS | Status: DC
  Filled 2023-09-22: qty 25

## 2023-09-22 MED ORDER — DEXMEDETOMIDINE HCL IN NACL 400 MCG/100ML IV SOLN
0.1000 ug/kg/h | INTRAVENOUS | Status: DC
  Filled 2023-09-22: qty 100

## 2023-09-22 MED ORDER — FUROSEMIDE 10 MG/ML IJ SOLN
40.0000 mg | Freq: Once | INTRAMUSCULAR | Status: AC
  Administered 2023-09-22: 40 mg via INTRAVENOUS
  Filled 2023-09-22: qty 4

## 2023-09-22 MED ORDER — MILRINONE LACTATE IN DEXTROSE 20-5 MG/100ML-% IV SOLN
0.3000 ug/kg/min | INTRAVENOUS | Status: DC
  Filled 2023-09-22: qty 100

## 2023-09-22 MED ORDER — LIVING WELL WITH DIABETES BOOK
Freq: Once | Status: AC
  Filled 2023-09-22: qty 1

## 2023-09-22 MED ORDER — SACUBITRIL-VALSARTAN 24-26 MG PO TABS
1.0000 | ORAL_TABLET | Freq: Two times a day (BID) | ORAL | Status: DC
  Administered 2023-09-22 – 2023-09-24 (×5): 1 via ORAL
  Filled 2023-09-22 (×5): qty 1

## 2023-09-22 MED ORDER — INSULIN REGULAR(HUMAN) IN NACL 100-0.9 UT/100ML-% IV SOLN
INTRAVENOUS | Status: DC
  Filled 2023-09-22: qty 100

## 2023-09-22 MED ORDER — POTASSIUM CHLORIDE 2 MEQ/ML IV SOLN
80.0000 meq | INTRAVENOUS | Status: DC
  Filled 2023-09-22: qty 40

## 2023-09-22 MED ORDER — VANCOMYCIN HCL 1.5 G IV SOLR
1500.0000 mg | INTRAVENOUS | Status: DC
  Filled 2023-09-22: qty 30

## 2023-09-22 MED ORDER — ALBUTEROL SULFATE (2.5 MG/3ML) 0.083% IN NEBU
2.5000 mg | INHALATION_SOLUTION | Freq: Once | RESPIRATORY_TRACT | Status: AC
  Administered 2023-09-22: 2.5 mg via RESPIRATORY_TRACT

## 2023-09-22 MED ORDER — HEPARIN 30,000 UNITS/1000 ML (OHS) CELLSAVER SOLUTION
Status: DC
  Filled 2023-09-22: qty 1000

## 2023-09-22 MED ORDER — TRANEXAMIC ACID (OHS) BOLUS VIA INFUSION: 15.0000 mg/kg | INTRAVENOUS | Status: DC

## 2023-09-22 MED ORDER — MAGNESIUM SULFATE 50 % IJ SOLN
40.0000 meq | INTRAMUSCULAR | Status: DC
  Filled 2023-09-22: qty 9.85

## 2023-09-22 MED ORDER — NITROGLYCERIN IN D5W 200-5 MCG/ML-% IV SOLN
2.0000 ug/min | INTRAVENOUS | Status: DC
  Filled 2023-09-22: qty 250

## 2023-09-22 MED ORDER — FAMOTIDINE 20 MG PO TABS
20.0000 mg | ORAL_TABLET | Freq: Two times a day (BID) | ORAL | Status: DC | PRN
  Administered 2023-09-22 – 2023-09-25 (×2): 20 mg via ORAL
  Filled 2023-09-22 (×2): qty 1

## 2023-09-22 MED ORDER — INSULIN GLARGINE-YFGN 100 UNIT/ML ~~LOC~~ SOLN
40.0000 [IU] | Freq: Every day | SUBCUTANEOUS | Status: DC
  Administered 2023-09-22 – 2023-09-26 (×5): 40 [IU] via SUBCUTANEOUS
  Filled 2023-09-22 (×6): qty 0.4

## 2023-09-22 MED ORDER — NOREPINEPHRINE 4 MG/250ML-% IV SOLN
0.0000 ug/min | INTRAVENOUS | Status: DC
  Filled 2023-09-22: qty 250

## 2023-09-22 NOTE — Progress Notes (Signed)
 PHARMACY - ANTICOAGULATION CONSULT NOTE  Pharmacy Consult for heparin Indication: multivessel CAD  Allergies  Allergen Reactions   Trulicity [Dulaglutide] Other (See Comments)    Patient indicated this caused pancreatitis.     Patient Measurements: Height: 5\' 4"  (162.6 cm) Weight: 97.1 kg (214 lb) IBW/kg (Calculated) : 59.2 HEPARIN DW (KG): 80.9  Vital Signs: Temp: 98.2 F (36.8 C) (05/30 2013) Temp Source: Oral (05/30 2013) BP: 139/85 (05/30 2013) Pulse Rate: 76 (05/30 2013)  Labs: Recent Labs    09/21/23 0757 09/21/23 0957 09/21/23 1943 09/22/23 0213 09/22/23 0335 09/22/23 1130 09/22/23 2137  HGB 14.2  --   --  12.9*  --   --   --   HCT 43.6  --   --  39.8  --   --   --   PLT 315  --   --  275  --   --   --   HEPARINUNFRC  --   --  0.13*  --   --  0.16* 0.32  CREATININE 1.54*  --   --  1.66*  --   --   --   TROPONINIHS 323*   < > 4,189* 9,751* 9,750*  --   --    < > = values in this interval not displayed.    Estimated Creatinine Clearance: 41.7 mL/min (A) (by C-G formula based on SCr of 1.66 mg/dL (H)).  Assessment: 73 yo M presenting with chest pain without relief after taking nitroglycerin . Patient was started on IV heparin for ACS.  Patient is s/p cath on 5/29, found to have severe multivessel disease.  Pharmacy consulted to resume IV heparin on 5/29 PM.  Heparin level therapeutic and low normal; no bleeding documented.  Goal of Therapy:  Heparin level 0.3-0.7 units/ml Monitor platelets by anticoagulation protocol: Yes   Plan:  Increase IV heparin to 1250 units/hr Daily heparin level and CBC Monitor for s/sx of bleeding   Naseer Hearn D. Marikay Show, PharmD, BCPS, BCCCP 09/22/2023, 10:18 PM

## 2023-09-22 NOTE — Consult Note (Addendum)
 [address]       [place] [address]             [phone]        [name] [facility] Medical Record #[id number] Date of Birth: 1951-03-09  Referring: No ref. provider found Primary Care: Clem Currier, DO Primary Cardiologist:Kenneth Kathreen Pare, MD  Chief Complaint:    Chief Complaint  Patient presents with   Chest Pain   History of Present Illness:     [name] is a 73 year old male with a past medical history of coronary artery disease (PCI of LAD in 2005 in [place] per patient but no records available), diastolic dysfunction, cardiomyopathy. uncontrolled diabetes mellitus, hypertension, hyperlipidemia, and chronic kidney disease stage IIIa. The patient saw a pharmacist on 05/13 at the pharmacy clinic and was noted to have atypical chest pain including burning chest pain, the use of nitroglycerin  was reviewed as well as when to call 911. He then presented to the ED on 05/29 with complaints of chest pain, nausea, vomiting, shortness of breath and neck pain that was not relieved with the use of nitroglycerin . He reports the chest pain started about 1 week prior and was constant. This worsened with exertion and he would become diaphoretic but this was more severe with radiation the the left jaw/neck and associated with nausea and vomiting. On arrival to the ED he was tachycardic and Troponin I (high sensitivity) peaked at 9,751. He was ruled in for NSTEMI and started on heparin. He underwent heart catheterization on 05/29 which showed 90% stenosis of the proximal, mid and distal RCA, 80-90% stenosis of OM1, OM2, and OM3, and 80-90% stenosis of the proximal to mid LAD including 80% stenosis of the lesion that was previously treated. His LVEDP was severely elevated at 38-71mmHg and he was given IV Lasix  with recommendations to aggressively diurese prior to surgery. He underwent echocardiogram on 09/21/23 which was technically difficulty but measured his LVEF 35-40% with  moderately decreased function of the left ventricle and global hypokinesis, he did have aortic valve sclerosis without evidence of aortic stenosis, no other valve abnormalities were noted.  His last A1C was greater than 15 on 07/24/23, no updated HgA1C has been drawn yet. He does report his blood sugars lately have been 120-150 on his monitor. He reports he is noncompliant with his medications, states he does not take them when he feels good. 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 He lives with his wife in a 1 story home. He reports last night he had increased chest pain but he is currently chest pain free and feels great.   Current Activity/ Functional Status: Patient is independent with mobility/ambulation, transfers, ADL's, IADL's.   Zubrod Score: At the time of surgery this patient's most appropriate activity status/level should be described as: []     0    Normal activity, no symptoms [x]     1    Restricted in physical strenuous activity but ambulatory, able to do out light work []     2    Ambulatory and capable of self care, unable to do work activities, up and about                 more than 50%  Of the time                            []   3    Only limited self care, in bed greater than 50% of waking hours []     4    Completely disabled, no self care, confined to bed or chair []     5    Moribund  Past Medical History:  Diagnosis Date   CAD (coronary artery disease)    Diabetes mellitus without complication (HCC)    Hypercholesterolemia    Hypertension    MI (myocardial infarction) (HCC)    Renal insufficiency     Past Surgical History:  Procedure Laterality Date   CORONARY ANGIOPLASTY WITH STENT PLACEMENT  08/01/2003   Proximal LAD   LEFT HEART CATH AND CORONARY ANGIOGRAPHY N/A 09/21/2023   Procedure: LEFT HEART CATH AND CORONARY ANGIOGRAPHY;  Surgeon: Kyra Phy, MD;  Location: [facility]AS[facility];  Service: Cardiovascular;   Laterality: N/A;    Social History   Tobacco Use  Smoking Status Former   Current packs/day: 0.00   Average packs/day: 0.5 packs/day for 10.0 years (5.0 ttl pk-yrs)   Types: Cigarettes   Start date: 04/26/1983   Quit date: 04/25/1993   Years since quitting: 30.4  Smokeless Tobacco Never    Social History   Substance and Sexual Activity  Alcohol Use No     Allergies  Allergen Reactions   Trulicity [Dulaglutide] Other (See Comments)    Patient indicated this caused pancreatitis.     Current Facility-Administered Medications  Medication Dose Route Frequency Provider Last Rate Last Admin   0.9 %  sodium chloride  infusion  250 mL Intravenous PRN Thukkani, Arun K, MD       aspirin  chewable tablet 81 mg  81 mg Oral Daily Thukkani, Arun K, MD   81 mg at 09/22/23 1610   carvedilol  (COREG ) tablet 25 mg  25 mg Oral BID Bronson, Martin, DO   25 mg at 09/22/23 0911   famotidine (PEPCID) tablet 20 mg  20 mg Oral BID PRN Miller, Samantha, DO   20 mg at 09/22/23 0156   heparin ADULT infusion 100 units/mL (25000 units/250mL)  950 Units/hr Intravenous Continuous Estela Held, RPH 9.5 mL/hr at 09/22/23 0616 950 Units/hr at 09/22/23 0616   insulin  aspart (novoLOG ) injection 0-15 Units  0-15 Units Subcutaneous TID WC [name], DO   5 Units at 09/22/23 9604   insulin  glargine-yfgn (SEMGLEE ) injection 30 Units  30 Units Subcutaneous QHS Clem Currier, DO   30 Units at 09/21/23 2139   lidocaine  (LIDODERM ) 5 % 1 patch  1 patch Transdermal QHS Rayma Calandra, DO   1 patch at 09/22/23 0156   nitroGLYCERIN  50 mg in dextrose 5 % 250 mL (0.2 mg/mL) infusion  0-200 mcg/min Intravenous Titrated [name], DO   Stopped at 09/22/23 5409   rosuvastatin  (CRESTOR ) tablet 40 mg  40 mg Oral QPM [name], DO       sacubitril -valsartan  (ENTRESTO ) 24-26 mg per tablet  1 tablet Oral BID Lenise Quince, MD   1 tablet at 09/22/23 8119   sodium chloride  flush (NS) 0.9 % injection 3 mL  3 mL  Intravenous Q12H Thukkani, Arun K, MD   3 mL at 09/22/23 1478   sodium chloride  flush (NS) 0.9 % injection 3 mL  3 mL Intravenous PRN Thukkani, Arun K, MD        Medications Prior to Admission  Medication Sig Dispense Refill Last Dose/Taking   aspirin  EC 81 MG tablet Take 81 mg by mouth daily.   09/19/2023   carvedilol  (COREG ) 25  MG tablet Take 1 tablet (25 mg total) by mouth 2 (two) times daily. (Patient taking differently: Take 25 mg by mouth daily.) 180 tablet 3 09/20/2023   eplerenone  (INSPRA ) 25 MG tablet Take 1 tablet (25 mg total) by mouth daily. 30 tablet 11 09/20/2023   insulin  aspart (FIASP  FLEXTOUCH) 100 UNIT/ML FlexTouch Pen Inject 40 Units into the skin daily before supper.   09/20/2023   Insulin  Glargine (BASAGLAR  KWIKPEN) 100 UNIT/ML DIAL AND INJECT 65 UNITS UNDER THE SKIN DAILY. 15 mL 0 09/20/2023   metFORMIN  (GLUCOPHAGE ) 500 MG tablet Take 1 tablet (500 mg total) by mouth 2 (two) times daily with a meal.   09/20/2023   nitroGLYCERIN  (NITROSTAT ) 0.3 MG SL tablet Place 1 tablet (0.3 mg total) under the tongue every 5 (five) minutes as needed for chest pain. 90 tablet 12 09/21/2023   rosuvastatin  (CRESTOR ) 40 MG tablet Take 1 tablet (40 mg total) by mouth daily. 90 tablet 3 09/20/2023   Accu-Chek Softclix Lancets lancets USE AS DIRECTED UP TO FOUR TIMES DAILY (Patient not taking: Reported on 09/05/2023) 100 each 12    BD PEN NEEDLE NANO 2ND GEN 32G X 4 MM MISC USE 3 TIMES DAILY AS NEEDED BETWEEN MEALS AND BEDTIME 100 each 2    blood glucose meter kit and supplies Dispense based on patient and insurance preference. Use up to four times daily as directed. (FOR ICD-10 E10.9, E11.9). (Patient not taking: Reported on 09/05/2023) 1 each 0    Continuous Glucose Sensor (FREESTYLE LIBRE 3 SENSOR) MISC 2 Devices by Does not apply route as needed. Place 1 sensor on the skin every 14 days. Use to check glucose continuously 2 each 11    glucose blood (ACCU-CHEK GUIDE) test strip Use as instructed (Patient  not taking: Reported on 09/05/2023) 100 each 12     Family History  Problem Relation Age of Onset   Heart disease Mother    Diabetes Mother    Hypertension Mother    Alcoholism Father    Heart disease Father    Hypertension Sister    Diabetes Brother    Hypercholesterolemia Brother    Osteoporosis Brother    Hypertension Brother    Kidney disease Brother    Review of Systems:  Review of Systems  Constitutional:  Positive for diaphoresis and malaise/fatigue. Negative for chills and fever.  HENT:  Negative for hearing loss.   Eyes:  Negative for blurred vision.       Wears glasses  Respiratory:  Positive for shortness of breath. Negative for cough, sputum production and wheezing.   Cardiovascular:  Positive for chest pain. Negative for palpitations and leg swelling.  Gastrointestinal:  Positive for constipation, heartburn, nausea and vomiting. Negative for abdominal pain, blood in stool and diarrhea.  Genitourinary:  Negative for dysuria.  Musculoskeletal:  Positive for neck pain.  Skin:  Negative for rash.  Neurological:  Negative for dizziness, focal weakness, loss of consciousness and weakness.  Endo/Heme/Allergies:  Does not bruise/bleed easily.  Psychiatric/Behavioral:  Negative for depression. The patient is not nervous/anxious.     Physical Exam: BP (!) 141/82 (BP Location: Left Arm)   Pulse 82   Temp 98.4 F (36.9 C) (Oral)   Resp 20   Ht 5\' 4"  (1.626 m)   Wt 97.1 kg   SpO2 100%   BMI 36.73 kg/m   General appearance: alert, cooperative, and no distress Head: Normocephalic, without obvious abnormality, atraumatic Neck: no adenopathy, no carotid bruit, no JVD, supple, symmetrical, trachea  midline, and thyroid  not enlarged, symmetric, no tenderness/mass/nodules Lymph nodes: Cervical, supraclavicular, and axillary nodes normal. Resp: clear to auscultation bilaterally Cardio: regular rate and rhythm, S1, S2 normal, no murmur, click, rub or gallop GI: soft,  non-tender; bowel sounds normal; no masses,  no organomegaly Extremities: extremities normal, atraumatic, no cyanosis or edema Neurologic: Grossly normal  Diagnostic Studies & Radiology Findings: LEFT HEART CATH AND CORONARY ANGIOGRAPHY     Prox RCA lesion is 90% stenosed.   Mid RCA lesion is 90% stenosed.   Dist RCA lesion is 90% stenosed.   RPDA lesion is 80% stenosed.   1st Mrg lesion is 80% stenosed.   2nd Mrg lesion is 90% stenosed.   3rd Mrg lesion is 80% stenosed.   Prox LAD to Mid LAD lesion is 80% stenosed.   Mid LAD-1 lesion is 80% stenosed.   Mid LAD-2 lesion is 90% stenosed.    Severe multivessel disease. Severely elevated LVEDP of 38 to 40 mmHg; Lasix  40 mg IV x 1 was administered.  ECHOCARDIOGRAM REPORT    Patient Name:   [name] Date of Exam: 09/21/2023  Medical Rec #:  [id number]  Height:       64.0 in  Accession #:    [id number] Weight:       214.0 lb  Date of Birth:  09-24-1950   BSA:          2.013 m  Patient Age:    73 years   BP:           178/106 mmHg  Patient Gender: M          HR:           98 bpm.  Exam Location:  Inpatient   Procedure: 2D Echo, Cardiac Doppler, Color Doppler and Intracardiac             Opacification Agent (Both Spectral and Color Flow Doppler were             utilized during procedure).   Indications:    NSTEMI    History:        Patient has prior history of Echocardiogram examinations,  most                 recent 01/23/2022. CAD; Risk Factors:Hypertension,  Dyslipidemia                 and Diabetes.    Sonographer:    Astrid Blamer  Referring Phys: [id number] CALLIE E GOODRICH   IMPRESSIONS     1. Left ventricular ejection fraction, by estimation, is 35 to 40%. The  left ventricle has moderately decreased function. The left ventricle  demonstrates global hypokinesis. Left ventricular diastolic function could  not be evaluated.   2. Right ventricular systolic function was not well visualized. The right  ventricular size is  not well visualized.   3. The mitral valve is normal in structure. No evidence of mitral valve  regurgitation. No evidence of mitral stenosis.   4. The aortic valve is tricuspid. Aortic valve regurgitation is not  visualized. Aortic valve sclerosis/calcification is present, without any  evidence of aortic stenosis. Aortic valve area, by VTI measures 2.06 cm.  Aortic valve mean gradient measures  2.0 mmHg. Aortic valve Vmax measures 0.98 m/s.   5. The inferior vena cava is normal in size with greater than 50%  respiratory variability, suggesting right atrial pressure of 3 mmHg.   FINDINGS   Left Ventricle: Left ventricular ejection fraction,  by estimation, is 35  to 40%. The left ventricle has moderately decreased function. The left  ventricle demonstrates global hypokinesis. Definity contrast agent was  given IV to delineate the left  ventricular endocardial borders. The left ventricular internal cavity size  was normal in size. There is no left ventricular hypertrophy. Left  ventricular diastolic function could not be evaluated.   Right Ventricle: The right ventricular size is not well visualized. Right  vetricular wall thickness was not assessed. Right ventricular systolic  function was not well visualized.   Left Atrium: Left atrial size was normal in size.   Right Atrium: Right atrial size was normal in size.   Pericardium: There is no evidence of pericardial effusion.   Mitral Valve: The mitral valve is normal in structure. No evidence of  mitral valve regurgitation. No evidence of mitral valve stenosis.   Tricuspid Valve: The tricuspid valve is normal in structure. Tricuspid  valve regurgitation is not demonstrated. No evidence of tricuspid  stenosis.   Aortic Valve: The aortic valve is tricuspid. Aortic valve regurgitation is  not visualized. Aortic valve sclerosis/calcification is present, without  any evidence of aortic stenosis. Aortic valve mean gradient measures  2.0  mmHg. Aortic valve peak gradient  measures 3.9 mmHg. Aortic valve area, by VTI measures 2.06 cm.   Pulmonic Valve: The pulmonic valve was normal in structure. Pulmonic valve  regurgitation is not visualized. No evidence of pulmonic stenosis.   Aorta: The aortic root is normal in size and structure.   Venous: The inferior vena cava is normal in size with greater than 50%  respiratory variability, suggesting right atrial pressure of 3 mmHg.   IAS/Shunts: No atrial level shunt detected by color flow Doppler.     LEFT VENTRICLE  PLAX 2D  LVIDd:         4.70 cm  LVIDs:         3.80 cm  LV PW:         0.70 cm  LV IVS:        0.90 cm  LVOT diam:     1.80 cm  LV SV:         35  LV SV Index:   17  LVOT Area:     2.54 cm     RIGHT VENTRICLE  RV S prime:     13.40 cm/s  TAPSE (M-mode): 1.8 cm   LEFT ATRIUM             Index  LA Vol (A2C):   33.5 ml 16.64 ml/m  LA Vol (A4C):   11.4 ml 5.67 ml/m  LA Biplane Vol: 27.9 ml 13.86 ml/m   AORTIC VALVE  AV Area (Vmax):    2.11 cm  AV Area (Vmean):   2.22 cm  AV Area (VTI):     2.06 cm  AV Vmax:           98.30 cm/s  AV Vmean:          71.900 cm/s  AV VTI:            0.169 m  AV Peak Grad:      3.9 mmHg  AV Mean Grad:      2.0 mmHg  LVOT Vmax:         81.40 cm/s  LVOT Vmean:        62.700 cm/s  LVOT VTI:          0.137 m  LVOT/AV VTI ratio: 0.81  MITRAL VALVE  MV Area (PHT): 5.84 cm     SHUNTS  MV Decel Time: 130 msec     Systemic VTI:  0.14 m  MV E velocity: 123.00 cm/s  Systemic Diam: 1.80 cm   Gaylyn Keas MD  Electronically signed by Gaylyn Keas MD  Signature Date/Time: 09/21/2023/5:07:15 PM     Final     Assessment & Plan: Multivessel CAD with NSTEMI: Hx of PCI of LAD in 2005 per patient. Chest pain free at the moment. Patient would benefit from CABG surgery but would be high risk due to comorbidities. Patient would also need to become compliant with medications. Dr. [name] to ultimately review and  determine surgical candidacy and timing. Possibly Monday if patient is a surgical candidate. Will need medication optimization and diuresis prior to surgery.  Ischemic cardiomyopathy and reduced EF: EF 35-40% CKD stage IIIa: Creatinine baseline around 1.3 Uncontrolled T2DM: Last A1C >15, new HgA1C pending Uncontrolled HTN: BP was 204/133 in the ED, noncompliant with blood pressure medication. On entresto  and coreg  HLD: On rosuvastatin  Possible lung nodule: Possible right lung nodule on CXR but follow up showed no definitive nodule was noted Medication noncompliance: Does not take medications when he feels good, counseled on the importance of daily medication use  Randa Burton, PA-C 09/22/23   I have seen and examined [name].  I reviewed his records and his catheterization images.  73 year old man with a history of coronary disease with a LAD stent 20 years ago.  Cardiac risk factors include poorly controlled type 2 diabetes, hypertension, hyperlipidemia, and chronic kidney disease.  Presented with about a 2 to 3-week history of waxing and waning chest pain which became severe and prolonged on the 2 days prior to admission.  Ruled in for MI.  Catheterization showed severe three-vessel coronary disease.  EF 35 to 40% by echocardiogram and LVEDP was elevated at 38 to 40 mmHg.  Currently pain-free.  Has been diuresed a couple of liters each of the past 2 days.  Coronary bypass grafting is indicated for survival benefit and relief of symptoms.  He does have suboptimal target vessels but should be graftable.  I described the postoperative procedure to [name] and his family.  I informed him of the general nature of the procedure including the need for general anesthesia, the incisions to be used, the use of cardiopulmonary bypass, the use of drains tubes and temporary pacemaker wires postoperatively, the expected hospital stay, and the overall recovery.  I informed them of the indications,  risks, benefits, and alternatives.  They understand the risks include, but not limited to death, stroke, MI, DVT, PE, bleeding, possible need for transfusion, infection, cardiac arrhythmias, respiratory or renal failure, as well as possibility of other unforeseeable complications.  He needs additional diuresis and much better control of his blood sugars prior to surgery.  Tentatively plan CABG on Wednesday, 09/27/2023.  Milon Aloe [name], MD [facility] [phone]

## 2023-09-22 NOTE — Assessment & Plan Note (Addendum)
 Improved, largely normotensive  - Continue carvedilol  - Continue Entresto  24/26 BID - Discontinued amlodipine  and irbesartan   - Hold hydrochlorothiazide  due to CKD

## 2023-09-22 NOTE — Assessment & Plan Note (Addendum)
 S/p heart cath which showed multivessel stenosis. Cardiothoracic surgery consulted and recommend medical optimization including better glucose and BP control with tentative plan for CABG on 6//25 - Cardiology consulted, greatly appreciate recommendations -  CTS consulted, pending recommendations  - Continue nitro drip and heparin  drip - A.m. BMP, Mag, CBC - Replete K with 40MEQ po KCL - Will need low-dose spironolactone  at discharge, hold on starting SGLT2 inhibitor at discharge due to HgbA1c > 15.

## 2023-09-22 NOTE — Assessment & Plan Note (Signed)
 Possible 8 mm right lung nodule on CXR at admission. Repeat CXR showed no definitive nodule.

## 2023-09-22 NOTE — Assessment & Plan Note (Deleted)
 Poorly controlled, last A1c greater than 15.  Not compliant with home insulin . - Continue moderate sliding scale - Continue basal insulin  at 30 units at night (half home dose) - increase to 35  - Titrate as necessary

## 2023-09-22 NOTE — Assessment & Plan Note (Addendum)
 Initial blood pressure of 204/133, now normotensive after initiation of (irbesartan , carvedilol , hydrochlorothiazide , amlodipine  and nitroglycerin ).  - Continue carvedilol  - Start Entresto  24/26 BID - Discontinue amlodipine  and irbesartan   - Hold hydrochlorothiazide  due to CKD

## 2023-09-22 NOTE — H&P (View-Only) (Signed)
 [address]       [place] [address]             [phone]        [name] [facility] Medical Record #[id number] Date of Birth: 11/27/1950  Referring: No ref. provider found Primary Care: Clem Currier, DO Primary Cardiologist:Kenneth Kathreen Pare, MD  Chief Complaint:    Chief Complaint  Patient presents with   Chest Pain   History of Present Illness:     [name] is a 73 year old male with a past medical history of coronary artery disease (PCI of LAD in 2005 in [place] per patient but no records available), diastolic dysfunction, cardiomyopathy. uncontrolled diabetes mellitus, hypertension, hyperlipidemia, and chronic kidney disease stage IIIa. The patient saw a pharmacist on 05/13 at the pharmacy clinic and was noted to have atypical chest pain including burning chest pain, the use of nitroglycerin  was reviewed as well as when to call 911. He then presented to the ED on 05/29 with complaints of chest pain, nausea, vomiting, shortness of breath and neck pain that was not relieved with the use of nitroglycerin . He reports the chest pain started about 1 week prior and was constant. This worsened with exertion and he would become diaphoretic but this was more severe with radiation the the left jaw/neck and associated with nausea and vomiting. On arrival to the ED he was tachycardic and Troponin I (high sensitivity) peaked at 9,751. He was ruled in for NSTEMI and started on heparin . He underwent heart catheterization on 05/29 which showed 90% stenosis of the proximal, mid and distal RCA, 80-90% stenosis of OM1, OM2, and OM3, and 80-90% stenosis of the proximal to mid LAD including 80% stenosis of the lesion that was previously treated. His LVEDP was severely elevated at 38-58mmHg and he was given IV Lasix  with recommendations to aggressively diurese prior to surgery. He underwent echocardiogram on 09/21/23 which was technically difficulty but measured his LVEF 35-40% with  moderately decreased function of the left ventricle and global hypokinesis, he did have aortic valve sclerosis without evidence of aortic stenosis, no other valve abnormalities were noted.  His last A1C was greater than 15 on 07/24/23, no updated HgA1C has been drawn yet. He does report his blood sugars lately have been 120-150 on his monitor. He reports he is noncompliant with his medications, states he does not take them when he feels good. 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 He lives with his wife in a 1 story home. He reports last night he had increased chest pain but he is currently chest pain free and feels great.   Current Activity/ Functional Status: Patient is independent with mobility/ambulation, transfers, ADL's, IADL's.   Zubrod Score: At the time of surgery this patient's most appropriate activity status/level should be described as: []     0    Normal activity, no symptoms [x]     1    Restricted in physical strenuous activity but ambulatory, able to do out light work []     2    Ambulatory and capable of self care, unable to do work activities, up and about                 more than 50%  Of the time                            []   3    Only limited self care, in bed greater than 50% of waking hours []     4    Completely disabled, no self care, confined to bed or chair []     5    Moribund  Past Medical History:  Diagnosis Date   CAD (coronary artery disease)    Diabetes mellitus without complication (HCC)    Hypercholesterolemia    Hypertension    MI (myocardial infarction) (HCC)    Renal insufficiency     Past Surgical History:  Procedure Laterality Date   CORONARY ANGIOPLASTY WITH STENT PLACEMENT  08/01/2003   Proximal LAD   LEFT HEART CATH AND CORONARY ANGIOGRAPHY N/A 09/21/2023   Procedure: LEFT HEART CATH AND CORONARY ANGIOGRAPHY;  Surgeon: Kyra Phy, MD;  Location: [facility] INVASIVE [facility];  Service: Cardiovascular;   Laterality: N/A;    Social History   Tobacco Use  Smoking Status Former   Current packs/day: 0.00   Average packs/day: 0.5 packs/day for 10.0 years (5.0 ttl pk-yrs)   Types: Cigarettes   Start date: 04/26/1983   Quit date: 04/25/1993   Years since quitting: 30.4  Smokeless Tobacco Never    Social History   Substance and Sexual Activity  Alcohol Use No     Allergies  Allergen Reactions   Trulicity [Dulaglutide] Other (See Comments)    Patient indicated this caused pancreatitis.     Current Facility-Administered Medications  Medication Dose Route Frequency Provider Last Rate Last Admin   0.9 %  sodium chloride  infusion  250 mL Intravenous PRN Thukkani, Arun K, MD       aspirin  chewable tablet 81 mg  81 mg Oral Daily Thukkani, Arun K, MD   81 mg at 09/22/23 0911   carvedilol  (COREG ) tablet 25 mg  25 mg Oral BID Bronson, Martin, DO   25 mg at 09/22/23 0911   famotidine  (PEPCID ) tablet 20 mg  20 mg Oral BID PRN Miller, Samantha, DO   20 mg at 09/22/23 0156   heparin  ADULT infusion 100 units/mL (25000 units/250mL)  950 Units/hr Intravenous Continuous Estela Held, RPH 9.5 mL/hr at 09/22/23 0616 950 Units/hr at 09/22/23 4098   insulin  aspart (novoLOG ) injection 0-15 Units  0-15 Units Subcutaneous TID WC [name], DO   5 Units at 09/22/23 1191   insulin  glargine-yfgn (SEMGLEE ) injection 30 Units  30 Units Subcutaneous QHS Clem Currier, DO   30 Units at 09/21/23 2139   lidocaine  (LIDODERM ) 5 % 1 patch  1 patch Transdermal QHS Rayma Calandra, DO   1 patch at 09/22/23 4782   nitroGLYCERIN  50 mg in dextrose  5 % 250 mL (0.2 mg/mL) infusion  0-200 mcg/min Intravenous Titrated [name], DO   Stopped at 09/22/23 9562   rosuvastatin  (CRESTOR ) tablet 40 mg  40 mg Oral QPM [name], DO       sacubitril -valsartan  (ENTRESTO ) 24-26 mg per tablet  1 tablet Oral BID Lenise Quince, MD   1 tablet at 09/22/23 1308   sodium chloride  flush (NS) 0.9 % injection 3 mL  3 mL  Intravenous Q12H Thukkani, Arun K, MD   3 mL at 09/22/23 6578   sodium chloride  flush (NS) 0.9 % injection 3 mL  3 mL Intravenous PRN Thukkani, Arun K, MD        Medications Prior to Admission  Medication Sig Dispense Refill Last Dose/Taking   aspirin  EC 81 MG tablet Take 81 mg by mouth daily.   09/19/2023   carvedilol  (COREG ) 25  MG tablet Take 1 tablet (25 mg total) by mouth 2 (two) times daily. (Patient taking differently: Take 25 mg by mouth daily.) 180 tablet 3 09/20/2023   eplerenone  (INSPRA ) 25 MG tablet Take 1 tablet (25 mg total) by mouth daily. 30 tablet 11 09/20/2023   insulin  aspart (FIASP  FLEXTOUCH) 100 UNIT/ML FlexTouch Pen Inject 40 Units into the skin daily before supper.   09/20/2023   Insulin  Glargine (BASAGLAR  KWIKPEN) 100 UNIT/ML DIAL AND INJECT 65 UNITS UNDER THE SKIN DAILY. 15 mL 0 09/20/2023   metFORMIN  (GLUCOPHAGE ) 500 MG tablet Take 1 tablet (500 mg total) by mouth 2 (two) times daily with a meal.   09/20/2023   nitroGLYCERIN  (NITROSTAT ) 0.3 MG SL tablet Place 1 tablet (0.3 mg total) under the tongue every 5 (five) minutes as needed for chest pain. 90 tablet 12 09/21/2023   rosuvastatin  (CRESTOR ) 40 MG tablet Take 1 tablet (40 mg total) by mouth daily. 90 tablet 3 09/20/2023   Accu-Chek Softclix Lancets lancets USE AS DIRECTED UP TO FOUR TIMES DAILY (Patient not taking: Reported on 09/05/2023) 100 each 12    BD PEN NEEDLE NANO 2ND GEN 32G X 4 MM MISC USE 3 TIMES DAILY AS NEEDED BETWEEN MEALS AND BEDTIME 100 each 2    blood glucose meter kit and supplies Dispense based on patient and insurance preference. Use up to four times daily as directed. (FOR ICD-10 E10.9, E11.9). (Patient not taking: Reported on 09/05/2023) 1 each 0    Continuous Glucose Sensor (FREESTYLE LIBRE 3 SENSOR) MISC 2 Devices by Does not apply route as needed. Place 1 sensor on the skin every 14 days. Use to check glucose continuously 2 each 11    glucose blood (ACCU-CHEK GUIDE) test strip Use as instructed (Patient  not taking: Reported on 09/05/2023) 100 each 12     Family History  Problem Relation Age of Onset   Heart disease Mother    Diabetes Mother    Hypertension Mother    Alcoholism Father    Heart disease Father    Hypertension Sister    Diabetes Brother    Hypercholesterolemia Brother    Osteoporosis Brother    Hypertension Brother    Kidney disease Brother    Review of Systems:  Review of Systems  Constitutional:  Positive for diaphoresis and malaise/fatigue. Negative for chills and fever.  HENT:  Negative for hearing loss.   Eyes:  Negative for blurred vision.       Wears glasses  Respiratory:  Positive for shortness of breath. Negative for cough, sputum production and wheezing.   Cardiovascular:  Positive for chest pain. Negative for palpitations and leg swelling.  Gastrointestinal:  Positive for constipation, heartburn, nausea and vomiting. Negative for abdominal pain, blood in stool and diarrhea.  Genitourinary:  Negative for dysuria.  Musculoskeletal:  Positive for neck pain.  Skin:  Negative for rash.  Neurological:  Negative for dizziness, focal weakness, loss of consciousness and weakness.  Endo/Heme/Allergies:  Does not bruise/bleed easily.  Psychiatric/Behavioral:  Negative for depression. The patient is not nervous/anxious.     Physical Exam: BP (!) 141/82 (BP Location: Left Arm)   Pulse 82   Temp 98.4 F (36.9 C) (Oral)   Resp 20   Ht 5\' 4"  (1.626 m)   Wt 97.1 kg   SpO2 100%   BMI 36.73 kg/m   General appearance: alert, cooperative, and no distress Head: Normocephalic, without obvious abnormality, atraumatic Neck: no adenopathy, no carotid bruit, no JVD, supple, symmetrical, trachea  midline, and thyroid  not enlarged, symmetric, no tenderness/mass/nodules Lymph nodes: Cervical, supraclavicular, and axillary nodes normal. Resp: clear to auscultation bilaterally Cardio: regular rate and rhythm, S1, S2 normal, no murmur, click, rub or gallop GI: soft,  non-tender; bowel sounds normal; no masses,  no organomegaly Extremities: extremities normal, atraumatic, no cyanosis or edema Neurologic: Grossly normal  Diagnostic Studies & Radiology Findings: LEFT HEART CATH AND CORONARY ANGIOGRAPHY     Prox RCA lesion is 90% stenosed.   Mid RCA lesion is 90% stenosed.   Dist RCA lesion is 90% stenosed.   RPDA lesion is 80% stenosed.   1st Mrg lesion is 80% stenosed.   2nd Mrg lesion is 90% stenosed.   3rd Mrg lesion is 80% stenosed.   Prox LAD to Mid LAD lesion is 80% stenosed.   Mid LAD-1 lesion is 80% stenosed.   Mid LAD-2 lesion is 90% stenosed.    Severe multivessel disease. Severely elevated LVEDP of 38 to 40 mmHg; Lasix  40 mg IV x 1 was administered.  ECHOCARDIOGRAM REPORT    Patient Name:   CARTHEL CASTILLE Date of Exam: 09/21/2023  Medical Rec #:  [id number]  Height:       64.0 in  Accession #:    [id number] Weight:       214.0 lb  Date of Birth:  11/11/1950   BSA:          2.013 m  Patient Age:    73 years   BP:           178/106 mmHg  Patient Gender: M          HR:           98 bpm.  Exam Location:  Inpatient   Procedure: 2D Echo, Cardiac Doppler, Color Doppler and Intracardiac             Opacification Agent (Both Spectral and Color Flow Doppler were             utilized during procedure).   Indications:    NSTEMI    History:        Patient has prior history of Echocardiogram examinations,  most                 recent 01/23/2022. CAD; Risk Factors:Hypertension,  Dyslipidemia                 and Diabetes.    Sonographer:    Astrid Blamer  Referring Phys: [id number] CALLIE E GOODRICH   IMPRESSIONS     1. Left ventricular ejection fraction, by estimation, is 35 to 40%. The  left ventricle has moderately decreased function. The left ventricle  demonstrates global hypokinesis. Left ventricular diastolic function could  not be evaluated.   2. Right ventricular systolic function was not well visualized. The right  ventricular size is  not well visualized.   3. The mitral valve is normal in structure. No evidence of mitral valve  regurgitation. No evidence of mitral stenosis.   4. The aortic valve is tricuspid. Aortic valve regurgitation is not  visualized. Aortic valve sclerosis/calcification is present, without any  evidence of aortic stenosis. Aortic valve area, by VTI measures 2.06 cm.  Aortic valve mean gradient measures  2.0 mmHg. Aortic valve Vmax measures 0.98 m/s.   5. The inferior vena cava is normal in size with greater than 50%  respiratory variability, suggesting right atrial pressure of 3 mmHg.   FINDINGS   Left Ventricle: Left ventricular ejection fraction,  by estimation, is 35  to 40%. The left ventricle has moderately decreased function. The left  ventricle demonstrates global hypokinesis. Definity  contrast agent was  given IV to delineate the left  ventricular endocardial borders. The left ventricular internal cavity size  was normal in size. There is no left ventricular hypertrophy. Left  ventricular diastolic function could not be evaluated.   Right Ventricle: The right ventricular size is not well visualized. Right  vetricular wall thickness was not assessed. Right ventricular systolic  function was not well visualized.   Left Atrium: Left atrial size was normal in size.   Right Atrium: Right atrial size was normal in size.   Pericardium: There is no evidence of pericardial effusion.   Mitral Valve: The mitral valve is normal in structure. No evidence of  mitral valve regurgitation. No evidence of mitral valve stenosis.   Tricuspid Valve: The tricuspid valve is normal in structure. Tricuspid  valve regurgitation is not demonstrated. No evidence of tricuspid  stenosis.   Aortic Valve: The aortic valve is tricuspid. Aortic valve regurgitation is  not visualized. Aortic valve sclerosis/calcification is present, without  any evidence of aortic stenosis. Aortic valve mean gradient measures  2.0  mmHg. Aortic valve peak gradient  measures 3.9 mmHg. Aortic valve area, by VTI measures 2.06 cm.   Pulmonic Valve: The pulmonic valve was normal in structure. Pulmonic valve  regurgitation is not visualized. No evidence of pulmonic stenosis.   Aorta: The aortic root is normal in size and structure.   Venous: The inferior vena cava is normal in size with greater than 50%  respiratory variability, suggesting right atrial pressure of 3 mmHg.   IAS/Shunts: No atrial level shunt detected by color flow Doppler.     LEFT VENTRICLE  PLAX 2D  LVIDd:         4.70 cm  LVIDs:         3.80 cm  LV PW:         0.70 cm  LV IVS:        0.90 cm  LVOT diam:     1.80 cm  LV SV:         35  LV SV Index:   17  LVOT Area:     2.54 cm     RIGHT VENTRICLE  RV S prime:     13.40 cm/s  TAPSE (M-mode): 1.8 cm   LEFT ATRIUM             Index  LA Vol (A2C):   33.5 ml 16.64 ml/m  LA Vol (A4C):   11.4 ml 5.67 ml/m  LA Biplane Vol: 27.9 ml 13.86 ml/m   AORTIC VALVE  AV Area (Vmax):    2.11 cm  AV Area (Vmean):   2.22 cm  AV Area (VTI):     2.06 cm  AV Vmax:           98.30 cm/s  AV Vmean:          71.900 cm/s  AV VTI:            0.169 m  AV Peak Grad:      3.9 mmHg  AV Mean Grad:      2.0 mmHg  LVOT Vmax:         81.40 cm/s  LVOT Vmean:        62.700 cm/s  LVOT VTI:          0.137 m  LVOT/AV VTI ratio: 0.81  MITRAL VALVE  MV Area (PHT): 5.84 cm     SHUNTS  MV Decel Time: 130 msec     Systemic VTI:  0.14 m  MV E velocity: 123.00 cm/s  Systemic Diam: 1.80 cm   Gaylyn Keas MD  Electronically signed by Gaylyn Keas MD  Signature Date/Time: 09/21/2023/5:07:15 PM     Final     Assessment & Plan: Multivessel CAD with NSTEMI: Hx of PCI of LAD in 2005 per patient. Chest pain free at the moment. Patient would benefit from CABG surgery but would be high risk due to comorbidities. Patient would also need to become compliant with medications. Dr. [name] to ultimately review and  determine surgical candidacy and timing. Possibly Monday if patient is a surgical candidate. Will need medication optimization and diuresis prior to surgery.  Ischemic cardiomyopathy and reduced EF: EF 35-40% CKD stage IIIa: Creatinine baseline around 1.3 Uncontrolled T2DM: Last A1C >15, new HgA1C pending Uncontrolled HTN: BP was 204/133 in the ED, noncompliant with blood pressure medication. On entresto  and coreg  HLD: On rosuvastatin  Possible lung nodule: Possible right lung nodule on CXR but follow up showed no definitive nodule was noted Medication noncompliance: Does not take medications when he feels good, counseled on the importance of daily medication use  Randa Burton, PA-C 09/22/23   I have seen and examined Mr. Butch.  I reviewed his records and his catheterization images.  73 year old man with a history of coronary disease with a LAD stent 20 years ago.  Cardiac risk factors include poorly controlled type 2 diabetes, hypertension, hyperlipidemia, and chronic kidney disease.  Presented with about a 2 to 3-week history of waxing and waning chest pain which became severe and prolonged on the 2 days prior to admission.  Ruled in for MI.  Catheterization showed severe three-vessel coronary disease.  EF 35 to 40% by echocardiogram and LVEDP was elevated at 38 to 40 mmHg.  Currently pain-free.  Has been diuresed a couple of liters each of the past 2 days.  Coronary bypass grafting is indicated for survival benefit and relief of symptoms.  He does have suboptimal target vessels but should be graftable.  I described the postoperative procedure to Mr. Longest and his family.  I informed him of the general nature of the procedure including the need for general anesthesia, the incisions to be used, the use of cardiopulmonary bypass, the use of drains tubes and temporary pacemaker wires postoperatively, the expected hospital stay, and the overall recovery.  I informed them of the indications,  risks, benefits, and alternatives.  They understand the risks include, but not limited to death, stroke, MI, DVT, PE, bleeding, possible need for transfusion, infection, cardiac arrhythmias, respiratory or renal failure, as well as possibility of other unforeseeable complications.  He needs additional diuresis and much better control of his blood sugars prior to surgery.  Tentatively plan CABG on Wednesday, 09/27/2023.  Milon Aloe [name], MD [facility] [phone]

## 2023-09-22 NOTE — Progress Notes (Signed)
     Daily Progress Note Intern Pager: [phone]  Patient name: [name] Medical record number: [id number] Date of birth: [birth year] Age: 73 y.o. Gender: male  Primary Care Provider: Clem Currier, DO Consultants: CTS, Cards Code Status: Full  Pt Overview and Major Events to Date:  5/29-admitted, cardiac cath completed  Assessment and Plan: [name] is a 73 year old male with PMH of uncontrolled T2DM and HTN, previous stent placement admitted for NSTEMI now s/p cardiac cath which showed extensive stenosis of all vessels. Assessment & Plan NSTEMI (non-ST elevated myocardial infarction) [facility]) S/p heart cath which showed multivessel stenosis. Cardiothoracic surgery consulted and recommend medical optimization including better glucose and BP control with tentative plan for CABG on 6//25 - Cardiology consulted, greatly appreciate recommendations -  CTS consulted, pending recommendations  - Continue nitro drip and heparin  drip - A.m. BMP, Mag, CBC - Replete K with 40MEQ po KCL - Will need low-dose spironolactone  at discharge, hold on starting SGLT2 inhibitor at discharge due to HgbA1c > 15.  Essential hypertension, benign Improved  - Continue carvedilol  - Continue Entresto  24/26 BID - Discontinue amlodipine  and irbesartan   - Hold hydrochlorothiazide  due to CKD Pulmonary nodule Possible 8 mm right lung nodule on CXR at admission. Repeat CXR showed no definitive nodule.   Chronic health problem T2DM: A1c> 15, not compliant with home regimen, on 40 units LAI daily. Continue SSI moderate.  CKDIII: Creatinine stable, avoid nephrotoxic meds.    FEN/GI: Thin liquid PPx: Heparin  gtt Dispo:Pending PT recommendations  . Barriers include medical management.   Subjective:  NAEON. Patient report he is doing well, no chest pain and denies any concerns at this time  Objective: Temp:  [97.6 F (36.4 C)-98.7 F (37.1 C)] 98.2 F (36.8 C) (05/30 2013) Pulse Rate:  [76-88] 76 (05/30  2013) Resp:  [12-21] 20 (05/30 2013) BP: (107-150)/(67-89) 139/85 (05/30 2013) SpO2:  [96 %-100 %] 100 % (05/30 2013) Physical Exam: General: Alert, well appearing, NAD CV: RRR, no murmurs, normal S1/S2 Pulm: CTAB, good WOB on RA, no crackles or wheezing Abd: Soft, no distension, no tenderness Skin: dry, warm Ext: No BLE edema  Laboratory: Most recent CBC Lab Results  Component Value Date   WBC 9.4 09/22/2023   HGB 12.9 (L) 09/22/2023   HCT 39.8 09/22/2023   MCV 89.0 09/22/2023   PLT 275 09/22/2023   Most recent BMP    Latest Ref Rng & Units 09/22/2023    2:13 AM  BMP  Glucose 70 - 99 mg/dL 147   BUN 8 - 23 mg/dL 25   Creatinine 8.29 - 1.24 mg/dL 5.62   Sodium 130 - 865 mmol/L 137   Potassium 3.5 - 5.1 mmol/L 3.8   Chloride 98 - 111 mmol/L 103   CO2 22 - 32 mmol/L 25   Calcium  8.9 - 10.3 mg/dL 8.8     Other pertinent labs  Mag> 2.3   Imaging/Diagnostic Tests: No new images  Goble Last, MD 09/22/2023, 9:47 PM  PGY-3, [facility] FPTS Intern pager: [phone], text pages welcome Secure chat group [facility] Teaching Service

## 2023-09-22 NOTE — Progress Notes (Addendum)
 Daily Progress Note Intern Pager: [phone]  Patient name: [name] Medical record number: [id number] Date of birth: 1951/03/29 Age: 73 y.o. Gender: male  Primary Care Provider: Clem Currier, DO Consultants: Cardiology  Code Status: Full code, confirmed by patient   Pt Overview and Major Events to Date:  09/21/2023: Admitted to FMTS, cardiac cath completed   Assessment and Plan: [name] is a 73 yo male with prior medical history of uncontrolled T2DM and HTN, previous stent placement admitted for NSTEMI. Cath on 5/29 showed extensive stenosis of all vessels and patient will likely require intervention from cardiothoracic surgery. CTS consulted 5/30 AM and will see.  Assessment & Plan NSTEMI (non-ST elevated myocardial infarction) (HCC) Elevated troponin, high risk with uncontrolled co-morbidities, hx of stent + cardiomyopathy. Continued chest pain and troponin elevated but stable overnight, likely cath related ischemia per cards. Pain improved this morning. CTS aware of patient and will see for consideration for CABG.  - Cardiology consulted, greatly appreciate recommendations -  CTS consulted, pending recommendations  - Continue nitro drip and heparin drip - A.m. BMP, Mag, CBC -  Single dose of Lasix  40 mg IV today for diuresis for diuresis prior to potential procedure per cards - Will need low-dose spironolactone  at discharge, hold on starting SGLT2 inhibitor at discharge due to HgbA1c > 15.  Essential hypertension, benign Initial blood pressure of 204/133, now normotensive after initiation of (irbesartan, carvedilol , hydrochlorothiazide , amlodipine  and nitroglycerin ).  - Continue carvedilol  - Start Entresto  24/26 BID - Discontinue amlodipine  and irbesartan  - Hold hydrochlorothiazide  due to CKD Pulmonary nodule Possible 8 mm right lung nodule on CXR at admission. Repeat CXR showed no definitive nodule.   Chronic health problem T2DM: A1c> 15, not compliant with home  regimen, increased basal to 45 units daily. Continue SSI moderate.  CKDIII: Creatinine stable, avoid nephrotoxic meds.    FEN/GI: Thin Liquid   PPx: Heparin  Dispo:Pending PT recommendations  pending clinical improvement .   Subjective:  Patient was seen resting comfortably in bed. He reports feeling much better this morning, feels almost back to baseline. He denies current chest pain and he has slight dyspnea with exertion. He denies any other pain, headache, lightheadedness, nausea, and vomiting. He acknowledges that he contributed to his current health condition by not taking his medications.   Objective: Temp:  [98.1 F (36.7 C)-99 F (37.2 C)] 98.1 F (36.7 C) (05/30 0230) Pulse Rate:  [0-122] 88 (05/30 0500) Resp:  [4-28] 12 (05/30 0500) BP: (107-204)/(67-133) 107/67 (05/30 0500) SpO2:  [95 %-100 %] 96 % (05/30 0500) Weight:  [97.1 kg] 97.1 kg (05/29 0754) Physical Exam: General: Alert, NAD, well developed  Cardiovascular: RRR, no murmurs/rubs/gallops Respiratory: Clear to auscultation anteriorly   Abdomen: Normoactive, soft, non-tender and non-distended  Extremities: No peripheral edema, warm and well perfused  Laboratory: Most recent CBC Lab Results  Component Value Date   WBC 9.4 09/22/2023   HGB 12.9 (L) 09/22/2023   HCT 39.8 09/22/2023   MCV 89.0 09/22/2023   PLT 275 09/22/2023   Most recent BMP    Latest Ref Rng & Units 09/22/2023    2:13 AM  BMP  Glucose 70 - 99 mg/dL 027   BUN 8 - 23 mg/dL 25   Creatinine 2.53 - 1.24 mg/dL 6.64   Sodium 403 - 474 mmol/L 137   Potassium 3.5 - 5.1 mmol/L 3.8   Chloride 98 - 111 mmol/L 103   CO2 22 - 32 mmol/L 25  Calcium  8.9 - 10.3 mg/dL 8.8     Other pertinent labs   Latest Reference Range & Units 09/22/23 02:13 09/22/23 03:35  Troponin I (High Sensitivity) <18 ng/L 9,751 (HH) 9,750 (HH)  (HH): Data is critically high   Imaging/Diagnostic Tests: Left Heart Cath 09/21/2023  Prox RCA lesion is 90% stenosed.   Mid  RCA lesion is 90% stenosed.   Dist RCA lesion is 90% stenosed.   RPDA lesion is 80% stenosed.   1st Mrg lesion is 80% stenosed.   2nd Mrg lesion is 90% stenosed.   3rd Mrg lesion is 80% stenosed.   Prox LAD to Mid LAD lesion is 80% stenosed.   Mid LAD-1 lesion is 80% stenosed.   Mid LAD-2 lesion is 90% stenosed.    Severe multivessel disease. Severely elevated LVEDP of 38 to 40 mmHg; Lasix  40 mg IV x 1 was administered.   Recommendation: Will obtain cardiothoracic surgical consultation; patient should be aggressively diuresed prior to surgery.  Talbot Factor, Medical Student 09/22/2023, 7:03 AM  I was personally present and performed or re-performed the history, physical exam and medical decision making activities of this service and have verified that the service and findings are accurately documented in the student's note.  Albin Huh, MD                  09/22/2023, 12:57 PM

## 2023-09-22 NOTE — Assessment & Plan Note (Addendum)
 Elevated troponin, high risk with uncontrolled co-morbidities, hx of stent + cardiomyopathy. Continued chest pain and troponin elevated but stable overnight, likely cath related ischemia per cards. Pain improved this morning. CTS aware of patient and will see for consideration for CABG.  - Cardiology consulted, greatly appreciate recommendations -  CTS consulted, pending recommendations  - Continue nitro drip and heparin drip - A.m. BMP, Mag, CBC -  Single dose of Lasix  40 mg IV today for diuresis for diuresis prior to potential procedure per cards - Will need low-dose spironolactone  at discharge, hold on starting SGLT2 inhibitor at discharge due to HgbA1c > 15.

## 2023-09-22 NOTE — Plan of Care (Signed)
 Spoke with Cards Fellow, Dr. Austine Blunt about rising Trop and he suggest its consistent with his ischemia seen on Cath. He agrees to continue Nitro and Heparin drip with plan for patient to be seen by CTS this morning.

## 2023-09-22 NOTE — Assessment & Plan Note (Deleted)
 CKDIII, creatinine stable.  Euvolemic on exam today. - Avoid nephrotoxic agents if able

## 2023-09-22 NOTE — Plan of Care (Addendum)
 Responded to page from nursing at 00:51 reguarding increase in patient's chest pain.  Per patient's nurse Thereasa Flaming, the patient was reporting that his pain had increased from a 4 to a 6 or a 7 and that he had not had improvement after increase in nitroglycerin  drip.  Ordered stat EKG and repeat troponin presented to bedside to evaluate the patient.  On arrival the patient's pain had decreased to a 4 or a 5 out of 10 and he reported that it radiated back-and-forth between his epigastrium and his chest.  Pain was reproducible with palpation of the epigastrium as well as the right upper quadrant.  Vitals on arrival were stable, heart rate in the low 90s, O2 sats appropriate on room air.  Patient was initially wearing 2 L O2 via nasal cannula for comfort, oxygen saturations remained in the high 90s without supplementation.  Will await results of EKG and repeat troponin.  Also ordered Lidoderm  patch and Pepcid to cover for MSK pain and possible acid reflux.  Greatest concern is the patient's heart condition worsening.  If this is indeed the case we will need to involve cardiothoracic surgery.

## 2023-09-22 NOTE — Assessment & Plan Note (Addendum)
 T2DM: A1c> 15, not compliant with home regimen, increased basal to 45 units daily. Continue SSI moderate.  CKDIII: Creatinine stable, avoid nephrotoxic meds.

## 2023-09-22 NOTE — Progress Notes (Signed)
 Rounding Note   Patient Name: [name] Date of Encounter: 09/22/2023  [facility] Cardiologist: Hazle Lites, MD   Subjective  No CP or dyspnea  Scheduled Meds:  amLODipine   5 mg Oral Daily   aspirin   81 mg Oral Daily   carvedilol   25 mg Oral BID   insulin  aspart  0-15 Units Subcutaneous TID WC   insulin  glargine-yfgn  30 Units Subcutaneous QHS   irbesartan  300 mg Oral Daily   lidocaine   1 patch Transdermal QHS   rosuvastatin   40 mg Oral QPM   sodium chloride  flush  3 mL Intravenous Q12H   Continuous Infusions:  sodium chloride      heparin 950 Units/hr (09/22/23 0616)   nitroGLYCERIN  Stopped (09/22/23 0722)   PRN Meds: sodium chloride , famotidine, sodium chloride  flush   Vital Signs  Vitals:   09/22/23 0000 09/22/23 0230 09/22/23 0500 09/22/23 0743  BP: 119/74 136/77 107/67 (!) 141/82  Pulse: 84 86 88 82  Resp: 18 20 12 20   Temp:  98.1 F (36.7 C)  98.4 F (36.9 C)  TempSrc:  Oral  Oral  SpO2: 99% 98% 96% 100%  Weight:      Height:        Intake/Output Summary (Last 24 hours) at 09/22/2023 0754 Last data filed at 09/22/2023 0616 Gross per 24 hour  Intake 202.65 ml  Output 2475 ml  Net -2272.35 ml      09/21/2023    7:54 AM 09/05/2023    4:33 PM 09/05/2023    3:03 PM  Last 3 Weights  Weight (lbs) 214 lb 214 lb 9.6 oz 214 lb 9.6 oz  Weight (kg) 97.07 kg 97.342 kg 97.342 kg      Telemetry Sinus with rare PVC- Personally Reviewed  Physical Exam  GEN: No acute distress.   Neck: supple Cardiac: RRR, no murmurs, rubs, or gallops.  Respiratory: Clear to auscultation bilaterally. GI: Soft, nontender, non-distended  MS: No edema; radial cath site with no hematoma Neuro:  Nonfocal  Psych: Normal affect   Labs High Sensitivity Troponin:   Recent Labs  Lab 09/21/23 0957 09/21/23 1218 09/21/23 1943 09/22/23 0213 09/22/23 0335  TROPONINIHS 272* 673* 4,189* 9,751* 9,750*     Chemistry Recent Labs  Lab 09/21/23 0757 09/22/23 0213   NA 137 137  K 3.5 3.8  CL 104 103  CO2 23 25  GLUCOSE 337* 286*  BUN 25* 25*  CREATININE 1.54* 1.66*  CALCIUM  8.8* 8.8*  MG  --  2.1  PROT 6.8  --   ALBUMIN 2.9*  --   AST 15  --   ALT 15  --   ALKPHOS 64  --   BILITOT 0.6  --   GFRNONAA 47* 43*  ANIONGAP 10 9     Hematology Recent Labs  Lab 09/21/23 0757 09/22/23 0213  WBC 9.0 9.4  RBC 4.92 4.47  HGB 14.2 12.9*  HCT 43.6 39.8  MCV 88.6 89.0  MCH 28.9 28.9  MCHC 32.6 32.4  RDW 12.3 12.3  PLT 315 275    BNP Recent Labs  Lab 09/21/23 0806  BNP 128.5*      Radiology  ECHOCARDIOGRAM COMPLETE Result Date: 09/21/2023    ECHOCARDIOGRAM REPORT   Patient Name:   [name] Date of Exam: 09/21/2023 Medical Rec #:  [id number]  Height:       64.0 in Accession #:    [id number] Weight:       214.0 lb Date of Birth:  21-Jul-1950   BSA:          2.013 m Patient Age:    73 years   BP:           178/106 mmHg Patient Gender: M          HR:           98 bpm. Exam Location:  Inpatient Procedure: 2D Echo, Cardiac Doppler, Color Doppler and Intracardiac            Opacification Agent (Both Spectral and Color Flow Doppler were            utilized during procedure). Indications:    NSTEMI  History:        Patient has prior history of Echocardiogram examinations, most                 recent 01/23/2022. CAD; Risk Factors:Hypertension, Dyslipidemia                 and Diabetes.  Sonographer:    Astrid Blamer Referring Phys: [id number] CALLIE E GOODRICH IMPRESSIONS  1. Left ventricular ejection fraction, by estimation, is 35 to 40%. The left ventricle has moderately decreased function. The left ventricle demonstrates global hypokinesis. Left ventricular diastolic function could not be evaluated.  2. Right ventricular systolic function was not well visualized. The right ventricular size is not well visualized.  3. The mitral valve is normal in structure. No evidence of mitral valve regurgitation. No evidence of mitral stenosis.  4. The aortic valve is  tricuspid. Aortic valve regurgitation is not visualized. Aortic valve sclerosis/calcification is present, without any evidence of aortic stenosis. Aortic valve area, by VTI measures 2.06 cm. Aortic valve mean gradient measures 2.0 mmHg. Aortic valve Vmax measures 0.98 m/s.  5. The inferior vena cava is normal in size with greater than 50% respiratory variability, suggesting right atrial pressure of 3 mmHg. FINDINGS  Left Ventricle: Left ventricular ejection fraction, by estimation, is 35 to 40%. The left ventricle has moderately decreased function. The left ventricle demonstrates global hypokinesis. Definity contrast agent was given IV to delineate the left ventricular endocardial borders. The left ventricular internal cavity size was normal in size. There is no left ventricular hypertrophy. Left ventricular diastolic function could not be evaluated. Right Ventricle: The right ventricular size is not well visualized. Right vetricular wall thickness was not assessed. Right ventricular systolic function was not well visualized. Left Atrium: Left atrial size was normal in size. Right Atrium: Right atrial size was normal in size. Pericardium: There is no evidence of pericardial effusion. Mitral Valve: The mitral valve is normal in structure. No evidence of mitral valve regurgitation. No evidence of mitral valve stenosis. Tricuspid Valve: The tricuspid valve is normal in structure. Tricuspid valve regurgitation is not demonstrated. No evidence of tricuspid stenosis. Aortic Valve: The aortic valve is tricuspid. Aortic valve regurgitation is not visualized. Aortic valve sclerosis/calcification is present, without any evidence of aortic stenosis. Aortic valve mean gradient measures 2.0 mmHg. Aortic valve peak gradient measures 3.9 mmHg. Aortic valve area, by VTI measures 2.06 cm. Pulmonic Valve: The pulmonic valve was normal in structure. Pulmonic valve regurgitation is not visualized. No evidence of pulmonic stenosis.  Aorta: The aortic root is normal in size and structure. Venous: The inferior vena cava is normal in size with greater than 50% respiratory variability, suggesting right atrial pressure of 3 mmHg. IAS/Shunts: No atrial level shunt detected by color flow Doppler.  LEFT VENTRICLE PLAX 2D LVIDd:  4.70 cm LVIDs:         3.80 cm LV PW:         0.70 cm LV IVS:        0.90 cm LVOT diam:     1.80 cm LV SV:         35 LV SV Index:   17 LVOT Area:     2.54 cm  RIGHT VENTRICLE RV S prime:     13.40 cm/s TAPSE (M-mode): 1.8 cm LEFT ATRIUM             Index LA Vol (A2C):   33.5 ml 16.64 ml/m LA Vol (A4C):   11.4 ml 5.67 ml/m LA Biplane Vol: 27.9 ml 13.86 ml/m  AORTIC VALVE AV Area (Vmax):    2.11 cm AV Area (Vmean):   2.22 cm AV Area (VTI):     2.06 cm AV Vmax:           98.30 cm/s AV Vmean:          71.900 cm/s AV VTI:            0.169 m AV Peak Grad:      3.9 mmHg AV Mean Grad:      2.0 mmHg LVOT Vmax:         81.40 cm/s LVOT Vmean:        62.700 cm/s LVOT VTI:          0.137 m LVOT/AV VTI ratio: 0.81 MITRAL VALVE MV Area (PHT): 5.84 cm     SHUNTS MV Decel Time: 130 msec     Systemic VTI:  0.14 m MV E velocity: 123.00 cm/s  Systemic Diam: 1.80 cm Gaylyn Keas MD Electronically signed by Gaylyn Keas MD Signature Date/Time: 09/21/2023/5:07:15 PM    Final    CARDIAC CATHETERIZATION Result Date: 09/21/2023   Prox RCA lesion is 90% stenosed.   Mid RCA lesion is 90% stenosed.   Dist RCA lesion is 90% stenosed.   RPDA lesion is 80% stenosed.   1st Mrg lesion is 80% stenosed.   2nd Mrg lesion is 90% stenosed.   3rd Mrg lesion is 80% stenosed.   Prox LAD to Mid LAD lesion is 80% stenosed.   Mid LAD-1 lesion is 80% stenosed.   Mid LAD-2 lesion is 90% stenosed.  Severe multivessel disease. Severely elevated LVEDP of 38 to 40 mmHg; Lasix  40 mg IV x 1 was administered. Recommendation: Will obtain cardiothoracic surgical consultation; patient should be aggressively diuresed prior to surgery.   DG Chest 2 View Result  Date: 09/21/2023 CLINICAL DATA:  Chest pain EXAM: CHEST - 2 VIEW COMPARISON:  Chest x-ray performed January 22, 2022 FINDINGS: Normal size heart. Elevated right hemidiaphragm, similar. No discrete lobar infiltrate. No pleural effusion or pneumothorax. Mild central congestion. An 8 mm nodule is present overlying the right lung. This may represent artifact secondary to overlying lead. IMPRESSION: 1. Mild central congestion. 2. Possible 8 mm right lung pulmonary nodule. As this may represent artifact, consideration should be given toward repeat PA and lateral chest radiograph to include apical lordotic views which can be performed as an outpatient. Electronically Signed   By: Reagan Camera M.D.   On: 09/21/2023 08:46    Patient Profile    73 y.o. male with a hx of coronary artery disease, diabetes mellitus, hypertension, hyperlipidemia, chronic renal insufficiency who is being seen 09/21/2023 for the evaluation of NSTEMI.  Echocardiogram this admission shows ejection fraction 35 to 40%.  Cardiac catheterization  showed 90% proximal, mid and distal RCA, 80% PDA, 80% first marginal, 90% second marginal, 80% third marginal, 80% proximal, 80 followed by 90% mid LAD; LVEDP 38-40.  Assessment & Plan   NSTEMI-cardiac catheterization revealed severe three-vessel coronary artery disease.  Echocardiogram shows ejection fraction 35 to 40%.  Continue aspirin , nitroglycerin , heparin, carvedilol  and statin.  Discontinue Avapro and treat with Entresto  24/26 twice daily.  Cardiothoracic surgery consult for potential coronary artery bypass graft.   Ischemic cardiomyopathy-as above I am discontinuing Avapro and instead we will treat with Entresto  24/26 twice daily.  LVEDP was severely elevated at time of catheterization but he is not markedly volume overloaded on examination.  Will give Lasix  40 mg IV x 1 today.  Would add SGLT2 inhibitor and low-dose spironolactone  at discharge once renal function is stable following all  procedures. Chronic stage IIIa kidney disease-renal function essentially unchanged today.  Continue to follow following recent catheterization. Hypertension-blood pressure has improved.  Given moderate LV dysfunction we will discontinue amlodipine .  Will continue carvedilol .  Discontinue Avapro and treat with Entresto  24/26 twice daily.  Hyperlipidemia-continue Crestor  40 mg daily. Diabetes mellitus-managed by primary care. Possible 8 mm right lung nodule on chest x-ray.  Will need follow-up PA and lateral. For questions or updates, please contact [facility] Please consult [email or web address] for contact info under     Signed, Alexandria Angel, MD  09/22/2023, 7:54 AM

## 2023-09-22 NOTE — Telephone Encounter (Signed)
 Pharmacy Patient Advocate Encounter  Insurance verification completed.    The patient is insured through [employer] [place]. Patient has Medicare and is not eligible for a copay card, but may be able to apply for patient assistance or Medicare RX Payment Plan (Patient Must reach out to their plan, if eligible for payment plan), if available.    Ran test claim for Farxiga  and the current 30 day co-pay is $359.78, $167.13 on future fills .  Ran test claim for Entresto  and the current 30 day co-pay is $239.65, $47 on future fills.  Ran test claim for Jardiance  and the current 30 day co-pay is $239.65, $47 on future fills.  This test claim was processed through [facility]- copay amounts may vary at other pharmacies due to pharmacy/plan contracts, or as the patient moves through the different stages of their insurance plan.

## 2023-09-22 NOTE — Assessment & Plan Note (Signed)
 T2DM: A1c> 15, not compliant with home regimen, on 40 units LAI daily. Continue SSI moderate.  CKDIII: Creatinine stable, avoid nephrotoxic meds.

## 2023-09-22 NOTE — Progress Notes (Signed)
 PHARMACY - ANTICOAGULATION CONSULT NOTE  Pharmacy Consult for heparin  Indication: multivessel CAD  Allergies  Allergen Reactions   Trulicity [Dulaglutide] Other (See Comments)    Patient indicated this caused pancreatitis.     Patient Measurements: Height: 5\' 4"  (162.6 cm) Weight: 97.1 kg (214 lb) IBW/kg (Calculated) : 59.2 HEPARIN  DW (KG): 80.9  Vital Signs: Temp: 98.4 F (36.9 C) (05/30 0743) Temp Source: Oral (05/30 0743) BP: 141/82 (05/30 0743) Pulse Rate: 82 (05/30 0743)  Labs: Recent Labs    09/21/23 0757 09/21/23 0957 09/21/23 1943 09/22/23 0213 09/22/23 0335 09/22/23 1130  HGB 14.2  --   --  12.9*  --   --   HCT 43.6  --   --  39.8  --   --   PLT 315  --   --  275  --   --   HEPARINUNFRC  --   --  0.13*  --   --  0.16*  CREATININE 1.54*  --   --  1.66*  --   --   TROPONINIHS 323*   < > 4,189* 9,751* 9,750*  --    < > = values in this interval not displayed.    Estimated Creatinine Clearance: 41.7 mL/min (A) (by C-G formula based on SCr of 1.66 mg/dL (H)).  Assessment: 73 yo M presenting with chest pain without relief after taking nitroglycerin . Patient was started on IV heparin  for ACS.  Patient is s/p cath on 5/29, found to have severe multivessel disease and CVTS consulted.  Pharmacy consulted to resume IV heparin  2 hours post radial band removal.  Radial band to be removed within the next several minutes per RN.  Heparin  level is subtherapeutic at 0.16 on 950 units/hr. No bleeding or infusion issues per RN. Hgb down to 12.9 and platelets are stable.  Goal of Therapy:  Heparin  level 0.3-0.7 units/ml Monitor platelets by anticoagulation protocol: Yes   Plan:  Increase IV heparin  to 1200 units/hr Check 8 hr heparin  level Daily heparin  level and CBC F/u CVTS consult Monitor for s/sx of bleeding   Thank you for involving pharmacy in this patient's care.  Caroline Cinnamon, PharmD, BCPS Clinical Pharmacist Clinical phone for 09/22/2023 is  [phone] 09/22/2023 1:21 PM

## 2023-09-22 NOTE — Assessment & Plan Note (Addendum)
 Possible 8 mm right lung nodule on CXR at admission. Repeat CXR showed no definitive nodule.

## 2023-09-22 NOTE — Inpatient Diabetes Management (Signed)
 Inpatient Diabetes Program Recommendations  AACE/ADA: New Consensus Statement on Inpatient Glycemic Control (2015)  Target Ranges:  Prepandial:   less than 140 mg/dL      Peak postprandial:   less than 180 mg/dL (1-2 hours)      Critically ill patients:  140 - 180 mg/dL   Lab Results  Component Value Date   GLUCAP 282 (H) 09/22/2023   HGBA1C 11.3 (H) 09/22/2023    Review of Glycemic Control  Latest Reference Range & Units 09/21/23 12:11 09/21/23 17:15 09/21/23 21:23 09/22/23 06:12  Glucose-Capillary 70 - 99 mg/dL 161 (H) 096 (H) 045 (H) 248 (H)  (H): Data is abnormally high  Diabetes history: DM2 Outpatient Diabetes medications:  Basaglar  65 units every day Fiasp  40 units with dinner Metformin  500 mg BID Freestyle Libre  Current orders for Inpatient glycemic control:  Semglee  30 units every day Novolog  0-15 units TID  Inpatient Diabetes Program Recommendations:    Might consider:  Semglee  40 units every day Novolog  3 units TID if he consumes at least 50%  Met with patient and family at bedside.  Reviewed patient's current A1c of >15% on 07/24/23.  He started using a Freestyle Libre 2-3 months ago; his A1C is now 11.2%.  Explained what a A1c is and what it measures. Also reviewed goal A1c with patient, importance of good glucose control @ home, and blood sugar goals.   He confirms above home medications.  He is prescribed Fiasp  40 units with dinner.  His BG will often go down to 90-100 mg/dL overnight.  He does not have episodes of hypoglycemia.  I would recommend discharging on correction TID as well as meal coverage.  We discussed this and I education them on how to use a correction scale with meal coverage.    IV insulin  is ordered for OR tomorrow.  Discussed importance of optimal glucose control for surgery, post op, healing and long and short term complications of diabetes.  Ordered the [facility] booklet.   Will follow while inpatient.    Thank you, Hays Lipschutz, MSN,  CDCES Diabetes Coordinator Inpatient Diabetes Program [phone] (team pager from 8a-5p)

## 2023-09-23 DIAGNOSIS — I129 Hypertensive chronic kidney disease with stage 1 through stage 4 chronic kidney disease, or unspecified chronic kidney disease: Secondary | ICD-10-CM | POA: Diagnosis not present

## 2023-09-23 DIAGNOSIS — E1122 Type 2 diabetes mellitus with diabetic chronic kidney disease: Secondary | ICD-10-CM | POA: Diagnosis not present

## 2023-09-23 DIAGNOSIS — N183 Chronic kidney disease, stage 3 unspecified: Secondary | ICD-10-CM

## 2023-09-23 DIAGNOSIS — I214 Non-ST elevation (NSTEMI) myocardial infarction: Secondary | ICD-10-CM | POA: Diagnosis not present

## 2023-09-23 DIAGNOSIS — E119 Type 2 diabetes mellitus without complications: Secondary | ICD-10-CM

## 2023-09-23 DIAGNOSIS — I255 Ischemic cardiomyopathy: Secondary | ICD-10-CM

## 2023-09-23 DIAGNOSIS — E1169 Type 2 diabetes mellitus with other specified complication: Secondary | ICD-10-CM

## 2023-09-23 DIAGNOSIS — I10 Essential (primary) hypertension: Secondary | ICD-10-CM | POA: Diagnosis not present

## 2023-09-23 DIAGNOSIS — N179 Acute kidney failure, unspecified: Secondary | ICD-10-CM

## 2023-09-23 DIAGNOSIS — E1165 Type 2 diabetes mellitus with hyperglycemia: Secondary | ICD-10-CM | POA: Diagnosis not present

## 2023-09-23 DIAGNOSIS — I259 Chronic ischemic heart disease, unspecified: Secondary | ICD-10-CM | POA: Diagnosis not present

## 2023-09-23 DIAGNOSIS — I240 Acute coronary thrombosis not resulting in myocardial infarction: Secondary | ICD-10-CM | POA: Diagnosis not present

## 2023-09-23 LAB — BASIC METABOLIC PANEL WITH GFR
Anion gap: 11 (ref 5–15)
BUN: 30 mg/dL — ABNORMAL HIGH (ref 8–23)
CO2: 25 mmol/L (ref 22–32)
Calcium: 8.4 mg/dL — ABNORMAL LOW (ref 8.9–10.3)
Chloride: 103 mmol/L (ref 98–111)
Creatinine, Ser: 1.98 mg/dL — ABNORMAL HIGH (ref 0.61–1.24)
GFR, Estimated: 35 mL/min — ABNORMAL LOW (ref >60)
Glucose, Bld: 173 mg/dL — ABNORMAL HIGH (ref 70–99)
Potassium: 3.3 mmol/L — ABNORMAL LOW (ref 3.5–5.1)
Sodium: 139 mmol/L (ref 135–145)

## 2023-09-23 LAB — CBC
HCT: 38.8 % — ABNORMAL LOW (ref 39.0–52.0)
Hemoglobin: 12.5 g/dL — ABNORMAL LOW (ref 13.0–17.0)
MCH: 28.4 pg (ref 26.0–34.0)
MCHC: 32.2 g/dL (ref 30.0–36.0)
MCV: 88.2 fL (ref 80.0–100.0)
Platelets: 241 10*3/uL (ref 150–400)
RBC: 4.4 MIL/uL (ref 4.22–5.81)
RDW: 12.4 % (ref 11.5–15.5)
WBC: 8.7 10*3/uL (ref 4.0–10.5)
nRBC: 0 % (ref 0.0–0.2)

## 2023-09-23 LAB — GLUCOSE, CAPILLARY
Glucose-Capillary: 167 mg/dL — ABNORMAL HIGH (ref 70–99)
Glucose-Capillary: 180 mg/dL — ABNORMAL HIGH (ref 70–99)
Glucose-Capillary: 180 mg/dL — ABNORMAL HIGH (ref 70–99)
Glucose-Capillary: 257 mg/dL — ABNORMAL HIGH (ref 70–99)

## 2023-09-23 LAB — MAGNESIUM: Magnesium: 2.3 mg/dL (ref 1.7–2.4)

## 2023-09-23 LAB — HEPARIN LEVEL (UNFRACTIONATED): Heparin Unfractionated: 0.41 [IU]/mL (ref 0.30–0.70)

## 2023-09-23 MED ORDER — POTASSIUM CHLORIDE CRYS ER 20 MEQ PO TBCR
40.0000 meq | EXTENDED_RELEASE_TABLET | Freq: Once | ORAL | Status: AC
  Administered 2023-09-23: 40 meq via ORAL
  Filled 2023-09-23: qty 2

## 2023-09-23 NOTE — Assessment & Plan Note (Signed)
 Improved, largely normotensive  - Continue carvedilol  - Continue Entresto  24/26 BID - Discontinued amlodipine  and irbesartan   - Hold hydrochlorothiazide  due to CKD

## 2023-09-23 NOTE — Assessment & Plan Note (Signed)
 Possible 8 mm right lung nodule on CXR at admission. Repeat CXR showed no definitive nodule.

## 2023-09-23 NOTE — Assessment & Plan Note (Signed)
 S/p heart cath which showed multivessel stenosis. Cardiothoracic surgery consulted and recommend medical optimization including better glucose and BP control with tentative plan for CABG on 6//25 - Cardiology consulted, greatly appreciate recommendations -  CTS consulted, pending recommendations  - Continue nitro drip and heparin  drip - A.m. BMP, Mag, CBC - Replete K with 40MEQ po KCL - Will need low-dose spironolactone  at discharge, hold on starting SGLT2 inhibitor at discharge due to HgbA1c > 15.

## 2023-09-23 NOTE — Assessment & Plan Note (Signed)
 T2DM: A1c> 15, not compliant with home regimen, on 40 units LAI daily. Continue SSI moderate.  CKDIII: Creatinine stable, avoid nephrotoxic meds.

## 2023-09-23 NOTE — Progress Notes (Signed)
  ***   Daily Progress Note Intern Pager: [phone]  Patient name: [name] Medical record number: [id number] Date of birth: 1950/10/16 Age: 73 y.o. Gender: male  Primary Care Provider: Clem Currier, DO Consultants: *** Code Status: ***  Pt Overview and Major Events to Date:  ***  Assessment and Plan:  ***. Pertinent PMH/PSH includes ***.  Assessment & Plan NSTEMI (non-ST elevated myocardial infarction) [facility]) S/p heart cath which showed multivessel stenosis. Cardiothoracic surgery consulted and recommend medical optimization including better glucose and BP control with tentative plan for CABG on 6//25 - Cardiology consulted, greatly appreciate recommendations -  CTS consulted, pending recommendations  - Continue nitro drip and heparin  drip - A.m. BMP, Mag, CBC - Replete K with 40MEQ po KCL - Will need low-dose spironolactone  at discharge, hold on starting SGLT2 inhibitor at discharge due to HgbA1c > 15.  Essential hypertension, benign Improved  - Continue carvedilol  - Continue Entresto  24/26 BID - Discontinue amlodipine  and irbesartan   - Hold hydrochlorothiazide  due to CKD Pulmonary nodule Possible 8 mm right lung nodule on CXR at admission. Repeat CXR showed no definitive nodule.   Chronic health problem T2DM: A1c> 15, not compliant with home regimen, on 40 units LAI daily. Continue SSI moderate.  CKDIII: Creatinine stable, avoid nephrotoxic meds.  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 Barriers include ***.   Subjective:  ***  Objective: Temp:  [97.4 F  (36.3 C)-98.6 F (37 C)] 97.4 F (36.3 C) (05/31 1952) Pulse Rate:  [68-82] 74 (05/31 1952) Resp:  [14-25] 25 (05/31 1952) BP: (119-157)/(64-90) 126/68 (05/31 1952) SpO2:  [96 %-100 %] 100 % (05/31 1952) Physical Exam: General: *** Cardiovascular: *** Respiratory: *** Abdomen: *** Extremities: ***  Laboratory: Most recent CBC Lab Results  Component Value Date   WBC 8.7 09/23/2023   HGB 12.5 (L) 09/23/2023   HCT 38.8 (L) 09/23/2023   MCV 88.2 09/23/2023   PLT 241 09/23/2023   Most recent BMP    Latest Ref Rng & Units 09/23/2023    3:53 AM  BMP  Glucose 70 - 99 mg/dL 562   BUN 8 - 23 mg/dL 30   Creatinine 1.30 - 1.24 mg/dL 8.65   Sodium 784 - 696 mmol/L 139   Potassium 3.5 - 5.1 mmol/L 3.3   Chloride 98 - 111 mmol/L 103   CO2 22 - 32 mmol/L 25   Calcium  8.9 - 10.3 mg/dL 8.4     Other pertinent labs ***   Imaging/Diagnostic Tests: Radiologist Impression: *** My interpretation: Ernestina Headland, MD 09/23/2023, 9:48 PM  PGY-***, [facility] FPTS Intern pager: [phone], text pages welcome Secure chat group [facility] Teaching Service

## 2023-09-23 NOTE — Progress Notes (Signed)
 Pt ambulated in hall with student nurse approximately 400' with front wheel walker on RA. Tolerated well. Back to room, wash up independently at sink, then to recliner.

## 2023-09-23 NOTE — Progress Notes (Signed)
  Progress Note  Patient Name: [name] Date of Encounter: 09/23/2023 [facility] Cardiologist: Hazle Lites, MD   Interval Summary   No chest pain  Shortness of breath improving On heparin  gtt  Vital Signs Vitals:   09/22/23 2315 09/23/23 0255 09/23/23 0745 09/23/23 0751  BP: (!) 141/88 (!) 157/90 (!) 150/87 (!) 150/87  Pulse: 82 77 71   Resp: 17 20 14 15   Temp: 98.6 F (37 C) 98.2 F (36.8 C) 98 F (36.7 C) 98 F (36.7 C)  TempSrc: Oral Oral Oral Oral  SpO2: 98% 100% 99% 99%  Weight:      Height:   5\' 4"  (1.626 m)     Intake/Output Summary (Last 24 hours) at 09/23/2023 0930 Last data filed at 09/23/2023 0756 Gross per 24 hour  Intake 324 ml  Output 1480 ml  Net -1156 ml      09/21/2023    7:54 AM 09/05/2023    4:33 PM 09/05/2023    3:03 PM  Last 3 Weights  Weight (lbs) 214 lb 214 lb 9.6 oz 214 lb 9.6 oz  Weight (kg) 97.07 kg 97.342 kg 97.342 kg      Telemetry/ECG  Sinus rhythm without ectopy- Personally Reviewed  Physical Exam  GEN: No acute distress.   Neck: No JVD Cardiac: RRR, no murmurs, rubs, or gallops.  Respiratory: Clear to auscultation bilaterally. GI: Soft, nontender, non-distended  MS: No edema, warm to touch  Assessment & Plan  NSTEMI: Ischemic cardiomyopathy Coronary artery disease-multivessel Underwent left heart catheterization and noted to have severe three-vessel disease. Echocardiogram: LVEF 35-40%. Cardiothoracic surgery consulted: Tentatively scheduled for CABG 09/27/2023.  CT surgery recommending further diuresis and glycemic control. Net IO Since Admission: -3,428.35 mL [09/23/23 1155] BNP 128, elevated LVEDP 38 to 40 mmHg Continue heparin  drip. Continue aspirin  81 mg p.o. daily. Continue carvedilol  25 mg p.o. twice daily. Continue Entresto  24/26 mg p.o. twice daily Serum creatinine increased from 1.66 yesterday 09/22/2018 25-1.98 today. Will hold off on Lasix  or SGLT2 initiation at this time Clinically appears to  be euvolemic.  Hypertension with chronic kidney disease stage IIIa: Blood pressures are improving. Monitor BP. Medications as discussed above  Acute kidney injury on chronic kidney disease stage IIIa: Creatinine on arrival 1.54. Creatinine this morning 1.98 Avoid nephrotoxic agents. Renally dose medications when appropriate. Monitor BUN and creatinine  Hyperlipidemia: Continue Crestor  40 mg p.o. daily.  Diabetes mellitus: Management primary team  Right lung nodule on chest x-ray: Possible 8 mm, defer management to primary team   For questions or updates, please contact [facility] Please consult [email or web address] for contact info under       Signed, Olinda Bertrand, DO, [facility]  A Division of [facility] [address] [place], [place] [address]  [place], [place] [address] 12:00 PM

## 2023-09-23 NOTE — Progress Notes (Signed)
 PHARMACY - ANTICOAGULATION CONSULT NOTE  Pharmacy Consult for heparin  Indication: multivessel CAD  Allergies  Allergen Reactions   Trulicity [Dulaglutide] Other (See Comments)    Patient indicated this caused pancreatitis.     Patient Measurements: Height: 5\' 4"  (162.6 cm) Weight: 97.1 kg (214 lb) IBW/kg (Calculated) : 59.2 HEPARIN  DW (KG): 80.9  Vital Signs: Temp: 98.2 F (36.8 C) (05/31 0255) Temp Source: Oral (05/31 0255) BP: 157/90 (05/31 0255) Pulse Rate: 77 (05/31 0255)  Labs: Recent Labs    09/21/23 0757 09/21/23 0957 09/21/23 1943 09/22/23 0213 09/22/23 0335 09/22/23 1130 09/22/23 2137 09/23/23 0353 09/23/23 0354  HGB 14.2  --   --  12.9*  --   --   --  12.5*  --   HCT 43.6  --   --  39.8  --   --   --  38.8*  --   PLT 315  --   --  275  --   --   --  241  --   HEPARINUNFRC  --    < > 0.13*  --   --  0.16* 0.32  --  0.41  CREATININE 1.54*  --   --  1.66*  --   --   --  1.98*  --   TROPONINIHS 323*   < > 4,189* 9,751* 9,750*  --   --   --   --    < > = values in this interval not displayed.    Estimated Creatinine Clearance: 35 mL/min (A) (by C-G formula based on SCr of 1.98 mg/dL (H)).  Assessment: 73 yo M presenting with chest pain without relief after taking nitroglycerin . Patient was started on IV heparin  for ACS. Patient is s/p cath on 5/29, found to have severe multivessel disease.  Pharmacy consulted to resume IV heparin  on 5/29 PM.  Heparin  level therapeutic (0.41) on 1250 units/hour. CBC WNL - hgb 12.5 and plts 241. No bleeding documented. Plan for CABG next week.   Goal of Therapy:  Heparin  level 0.3-0.7 units/ml Monitor platelets by anticoagulation protocol: Yes   Plan:  Continue IV heparin  at 1250 units/hr Daily heparin  level and CBC Monitor for s/sx of bleeding   Adaline Ada, PharmD PGY1 Pharmacy Resident 09/23/2023 7:49 AM

## 2023-09-23 NOTE — Progress Notes (Addendum)
 CARDIAC REHAB PHASE I Ed given to pt. Discussed surgery, sternal precautions, IS use, OHS booklet, ambulation, and care after discharge. Pt voiced understanding. Gave pt IS and watched pt use. Pt's spouse will care for him after discharge. Will continue to follow.  [id number] Floretta Huron, MS, ACSM-CEP 09/23/2023 9:28 AM

## 2023-09-24 DIAGNOSIS — I10 Essential (primary) hypertension: Secondary | ICD-10-CM | POA: Diagnosis not present

## 2023-09-24 DIAGNOSIS — E1122 Type 2 diabetes mellitus with diabetic chronic kidney disease: Secondary | ICD-10-CM | POA: Diagnosis not present

## 2023-09-24 DIAGNOSIS — N179 Acute kidney failure, unspecified: Secondary | ICD-10-CM

## 2023-09-24 DIAGNOSIS — I214 Non-ST elevation (NSTEMI) myocardial infarction: Secondary | ICD-10-CM | POA: Diagnosis not present

## 2023-09-24 DIAGNOSIS — Z794 Long term (current) use of insulin: Secondary | ICD-10-CM

## 2023-09-24 DIAGNOSIS — E1169 Type 2 diabetes mellitus with other specified complication: Secondary | ICD-10-CM | POA: Diagnosis not present

## 2023-09-24 DIAGNOSIS — I129 Hypertensive chronic kidney disease with stage 1 through stage 4 chronic kidney disease, or unspecified chronic kidney disease: Secondary | ICD-10-CM | POA: Diagnosis not present

## 2023-09-24 DIAGNOSIS — E1165 Type 2 diabetes mellitus with hyperglycemia: Secondary | ICD-10-CM | POA: Diagnosis not present

## 2023-09-24 DIAGNOSIS — I259 Chronic ischemic heart disease, unspecified: Secondary | ICD-10-CM | POA: Diagnosis not present

## 2023-09-24 DIAGNOSIS — I240 Acute coronary thrombosis not resulting in myocardial infarction: Secondary | ICD-10-CM | POA: Diagnosis not present

## 2023-09-24 DIAGNOSIS — I255 Ischemic cardiomyopathy: Secondary | ICD-10-CM | POA: Diagnosis not present

## 2023-09-24 LAB — GLUCOSE, CAPILLARY
Glucose-Capillary: 116 mg/dL — ABNORMAL HIGH (ref 70–99)
Glucose-Capillary: 241 mg/dL — ABNORMAL HIGH (ref 70–99)
Glucose-Capillary: 250 mg/dL — ABNORMAL HIGH (ref 70–99)
Glucose-Capillary: 243 mg/dL — ABNORMAL HIGH (ref 70–99)

## 2023-09-24 LAB — CBC
HCT: 39.9 % (ref 39.0–52.0)
Hemoglobin: 12.9 g/dL — ABNORMAL LOW (ref 13.0–17.0)
MCH: 28.9 pg (ref 26.0–34.0)
MCHC: 32.3 g/dL (ref 30.0–36.0)
MCV: 89.3 fL (ref 80.0–100.0)
Platelets: 283 10*3/uL (ref 150–400)
RBC: 4.47 MIL/uL (ref 4.22–5.81)
RDW: 12.4 % (ref 11.5–15.5)
WBC: 7.8 10*3/uL (ref 4.0–10.5)
nRBC: 0 % (ref 0.0–0.2)

## 2023-09-24 LAB — MAGNESIUM: Magnesium: 2.4 mg/dL (ref 1.7–2.4)

## 2023-09-24 LAB — BASIC METABOLIC PANEL WITH GFR
Anion gap: 10 (ref 5–15)
BUN: 37 mg/dL — ABNORMAL HIGH (ref 8–23)
CO2: 26 mmol/L (ref 22–32)
Calcium: 8.9 mg/dL (ref 8.9–10.3)
Chloride: 104 mmol/L (ref 98–111)
Creatinine, Ser: 2.21 mg/dL — ABNORMAL HIGH (ref 0.61–1.24)
GFR, Estimated: 31 mL/min — ABNORMAL LOW (ref >60)
Glucose, Bld: 144 mg/dL — ABNORMAL HIGH (ref 70–99)
Potassium: 3.6 mmol/L (ref 3.5–5.1)
Sodium: 140 mmol/L (ref 135–145)

## 2023-09-24 LAB — HEPARIN LEVEL (UNFRACTIONATED): Heparin Unfractionated: 0.51 [IU]/mL (ref 0.30–0.70)

## 2023-09-24 LAB — LIPOPROTEIN A (LPA): Lipoprotein (a): 582 nmol/L — ABNORMAL HIGH (ref <75.0)

## 2023-09-24 MED ORDER — POTASSIUM CHLORIDE CRYS ER 20 MEQ PO TBCR
40.0000 meq | EXTENDED_RELEASE_TABLET | Freq: Once | ORAL | Status: AC
  Administered 2023-09-24: 40 meq via ORAL
  Filled 2023-09-24: qty 2

## 2023-09-24 NOTE — Plan of Care (Signed)

## 2023-09-24 NOTE — Progress Notes (Signed)
  Progress Note  Patient Name: [name] Date of Encounter: 09/24/2023 [facility] Cardiologist: Hazle Lites, MD   Interval Summary   Resting in bed comfortably. No anginal chest pain  Vital Signs Vitals:   09/24/23 0024 09/24/23 0427 09/24/23 0838 09/24/23 1213  BP: 139/72 131/86 127/65 108/63  Pulse: 72 71 73 74  Resp: 18 (!) 8 17 12   Temp:  98.5 F (36.9 C) 98.6 F (37 C)   TempSrc:  Oral Oral Oral  SpO2: 99% 99% 96% 100%  Weight:      Height:        Intake/Output Summary (Last 24 hours) at 09/24/2023 1237 Last data filed at 09/24/2023 0857 Gross per 24 hour  Intake 336.78 ml  Output 1000 ml  Net -663.22 ml      09/21/2023    7:54 AM 09/05/2023    4:33 PM 09/05/2023    3:03 PM  Last 3 Weights  Weight (lbs) 214 lb 214 lb 9.6 oz 214 lb 9.6 oz  Weight (kg) 97.07 kg 97.342 kg 97.342 kg      Telemetry/ECG  Sinus rhythm without ectopy- Personally Reviewed  Physical Exam  GEN: No acute distress.   Neck: No JVD Cardiac: RRR, no murmurs, rubs, or gallops.  Respiratory: Clear to auscultation bilaterally. GI: Soft, nontender, non-distended  MS: No edema, warm to touch  Assessment & Plan  NSTEMI: Ischemic cardiomyopathy Coronary artery disease-multivessel Underwent left heart catheterization and noted to have severe three-vessel disease. Echocardiogram: LVEF 35-40%. Cardiothoracic surgery consulted: Tentatively scheduled for CABG 09/27/2023.  CT surgery recommending further diuresis and glycemic control. Net IO Since Admission: -4,091.57 mL [09/24/23 1237] BNP 128, elevated LVEDP 38 to 40 mmHg Continue heparin  drip. Continue aspirin  81 mg p.o. daily. Continue carvedilol  25 mg p.o. twice daily. D/C Entresto  24/26 mg p.o. twice daily due to renal funtion.  Will hold off on Lasix  or SGLT2 initiation at this time Clinically appears to be euvolemic.  Hypertension with chronic kidney disease stage IIIa: Blood pressures have improved. Due to acute kidney  injury discontinuing Entresto  and holding diuretics. If additional blood pressure medications are needed consider addition of hydralazine  and Imdur with holding parameters. Monitor for now  Acute kidney injury on chronic kidney disease stage IIIa: Creatinine on arrival 1.54. Creatinine this morning 2.21, compared to 1.98 09/23/2023 D/C Entresto  for now.  Recommend nephrology consult to optimize his renal function prior to CABG.  Avoid nephrotoxic agents. Renally dose medications when appropriate. Monitor BUN and creatinine  Hyperlipidemia: Continue Crestor  40 mg p.o. daily.  Diabetes mellitus: Management primary team  Right lung nodule on chest x-ray: Possible 8 mm, defer management to primary team   For questions or updates, please contact [facility] Please consult [email or web address] for contact info under       Signed, Olinda Bertrand, DO, [facility]  A Division of [facility] [address], [place],  [address]  [place], [place] [address] 12:37 PM

## 2023-09-24 NOTE — Progress Notes (Signed)
 PHARMACY - ANTICOAGULATION CONSULT NOTE  Pharmacy Consult for heparin  Indication: multivessel CAD  Allergies  Allergen Reactions   Trulicity [Dulaglutide] Other (See Comments)    Patient indicated this caused pancreatitis.     Patient Measurements: Height: 5\' 4"  (162.6 cm) Weight: 97.1 kg (214 lb) IBW/kg (Calculated) : 59.2 HEPARIN  DW (KG): 80.9  Vital Signs: Temp: 98.5 F (36.9 C) (06/01 0427) Temp Source: Oral (06/01 0427) BP: 131/86 (06/01 0427) Pulse Rate: 71 (06/01 0427)  Labs: Recent Labs     0000 09/21/23 1943 09/22/23 0960 09/22/23 0335 09/22/23 1130 09/22/23 2137 09/23/23 0353 09/23/23 0354 09/24/23 0357  HGB   < >  --  12.9*  --   --   --  12.5*  --  12.9*  HCT  --   --  39.8  --   --   --  38.8*  --  39.9  PLT  --   --  275  --   --   --  241  --  283  HEPARINUNFRC  --  0.13*  --   --    < > 0.32  --  0.41 0.51  CREATININE  --   --  1.66*  --   --   --  1.98*  --  2.21*  TROPONINIHS  --  4,189* 9,751* 9,750*  --   --   --   --   --    < > = values in this interval not displayed.    Estimated Creatinine Clearance: 31.3 mL/min (A) (by C-G formula based on SCr of 2.21 mg/dL (H)).  Assessment: 73 yo M presenting with chest pain without relief after taking nitroglycerin . Patient was started on IV heparin  for ACS. Patient is s/p cath on 5/29, found to have severe multivessel disease.  Pharmacy consulted to resume IV heparin  on 5/29 PM.  Heparin  level therapeutic (0.51) on 1250 units/hour. CBC WNL - hgb 12.9 and plts 283. No bleeding documented. Plan for CABG next week.   Goal of Therapy:  Heparin  level 0.3-0.7 units/ml Monitor platelets by anticoagulation protocol: Yes   Plan:  Continue IV heparin  at 1250 units/hr Daily heparin  level and CBC Monitor for s/sx of bleeding   Adaline Ada, PharmD PGY1 Pharmacy Resident 09/24/2023 8:03 AM

## 2023-09-25 ENCOUNTER — Inpatient Hospital Stay (HOSPITAL_COMMUNITY)

## 2023-09-25 DIAGNOSIS — Z01810 Encounter for preprocedural cardiovascular examination: Secondary | ICD-10-CM

## 2023-09-25 DIAGNOSIS — I240 Acute coronary thrombosis not resulting in myocardial infarction: Secondary | ICD-10-CM | POA: Diagnosis not present

## 2023-09-25 DIAGNOSIS — E1165 Type 2 diabetes mellitus with hyperglycemia: Secondary | ICD-10-CM | POA: Diagnosis not present

## 2023-09-25 DIAGNOSIS — E7800 Pure hypercholesterolemia, unspecified: Secondary | ICD-10-CM | POA: Diagnosis not present

## 2023-09-25 DIAGNOSIS — E1169 Type 2 diabetes mellitus with other specified complication: Secondary | ICD-10-CM | POA: Diagnosis not present

## 2023-09-25 DIAGNOSIS — I5043 Acute on chronic combined systolic (congestive) and diastolic (congestive) heart failure: Secondary | ICD-10-CM | POA: Diagnosis not present

## 2023-09-25 DIAGNOSIS — I2510 Atherosclerotic heart disease of native coronary artery without angina pectoris: Secondary | ICD-10-CM | POA: Diagnosis not present

## 2023-09-25 DIAGNOSIS — I255 Ischemic cardiomyopathy: Secondary | ICD-10-CM

## 2023-09-25 DIAGNOSIS — I10 Essential (primary) hypertension: Secondary | ICD-10-CM | POA: Diagnosis not present

## 2023-09-25 DIAGNOSIS — E1122 Type 2 diabetes mellitus with diabetic chronic kidney disease: Secondary | ICD-10-CM | POA: Diagnosis not present

## 2023-09-25 DIAGNOSIS — E7841 Elevated Lipoprotein(a): Secondary | ICD-10-CM

## 2023-09-25 DIAGNOSIS — N179 Acute kidney failure, unspecified: Secondary | ICD-10-CM | POA: Diagnosis not present

## 2023-09-25 DIAGNOSIS — E785 Hyperlipidemia, unspecified: Secondary | ICD-10-CM

## 2023-09-25 DIAGNOSIS — I214 Non-ST elevation (NSTEMI) myocardial infarction: Secondary | ICD-10-CM | POA: Diagnosis not present

## 2023-09-25 DIAGNOSIS — I259 Chronic ischemic heart disease, unspecified: Secondary | ICD-10-CM | POA: Diagnosis not present

## 2023-09-25 LAB — GLUCOSE, CAPILLARY
Glucose-Capillary: 147 mg/dL — ABNORMAL HIGH (ref 70–99)
Glucose-Capillary: 112 mg/dL — ABNORMAL HIGH (ref 70–99)
Glucose-Capillary: 242 mg/dL — ABNORMAL HIGH (ref 70–99)
Glucose-Capillary: 222 mg/dL — ABNORMAL HIGH (ref 70–99)
Glucose-Capillary: 222 mg/dL — ABNORMAL HIGH (ref 70–99)

## 2023-09-25 LAB — HEPARIN LEVEL (UNFRACTIONATED): Heparin Unfractionated: 0.43 [IU]/mL (ref 0.30–0.70)

## 2023-09-25 LAB — BASIC METABOLIC PANEL WITH GFR
Anion gap: 9 (ref 5–15)
BUN: 31 mg/dL — ABNORMAL HIGH (ref 8–23)
CO2: 23 mmol/L (ref 22–32)
Calcium: 8.4 mg/dL — ABNORMAL LOW (ref 8.9–10.3)
Chloride: 108 mmol/L (ref 98–111)
Creatinine, Ser: 2.04 mg/dL — ABNORMAL HIGH (ref 0.61–1.24)
GFR, Estimated: 34 mL/min — ABNORMAL LOW (ref >60)
Glucose, Bld: 166 mg/dL — ABNORMAL HIGH (ref 70–99)
Potassium: 4.5 mmol/L (ref 3.5–5.1)
Sodium: 140 mmol/L (ref 135–145)

## 2023-09-25 LAB — MAGNESIUM: Magnesium: 2.4 mg/dL (ref 1.7–2.4)

## 2023-09-25 LAB — CBC
HCT: 38.5 % — ABNORMAL LOW (ref 39.0–52.0)
Hemoglobin: 12.1 g/dL — ABNORMAL LOW (ref 13.0–17.0)
MCH: 28.8 pg (ref 26.0–34.0)
MCHC: 31.4 g/dL (ref 30.0–36.0)
MCV: 91.7 fL (ref 80.0–100.0)
Platelets: 244 10*3/uL (ref 150–400)
RBC: 4.2 MIL/uL — ABNORMAL LOW (ref 4.22–5.81)
RDW: 12.3 % (ref 11.5–15.5)
WBC: 7.5 10*3/uL (ref 4.0–10.5)
nRBC: 0 % (ref 0.0–0.2)

## 2023-09-25 LAB — VAS US DOPPLER PRE CABG
Left ABI: 0.51
Right ABI: 0.93

## 2023-09-25 MED ORDER — ATORVASTATIN CALCIUM 80 MG PO TABS
80.0000 mg | ORAL_TABLET | Freq: Every day | ORAL | Status: DC
  Administered 2023-09-25 – 2023-09-29 (×4): 80 mg via ORAL
  Filled 2023-09-25 (×4): qty 1

## 2023-09-25 NOTE — Progress Notes (Signed)
 Mobility Specialist Progress Note:    09/25/23 1059  Mobility  Activity Ambulated with assistance in hallway;Ambulated with assistance in room  Level of Assistance Modified independent, requires aide device or extra time  Assistive Device Other (Comment) (IV pole)  Distance Ambulated (ft) 400 ft  Activity Response Tolerated well  Mobility Referral Yes  Mobility visit 1 Mobility  Mobility Specialist Start Time (ACUTE ONLY) 1048  Mobility Specialist Stop Time (ACUTE ONLY) 1057  Mobility Specialist Time Calculation (min) (ACUTE ONLY) 9 min   Pt received in bed, agreeable to mobility session. Ambulated in hallway with IV pole and ModI. Tolerated well, asx throughout. Returned pt to room lying comfortably in bed with all needs met.   Kyler Germer Mobility Specialist Please contact via Special educational needs teacher or  Rehab office at [phone]

## 2023-09-25 NOTE — Inpatient Diabetes Management (Signed)
 Inpatient Diabetes Program Recommendations  AACE/ADA: New Consensus Statement on Inpatient Glycemic Control (2015)  Target Ranges:  Prepandial:   less than 140 mg/dL      Peak postprandial:   less than 180 mg/dL (1-2 hours)      Critically ill patients:  140 - 180 mg/dL    Latest Reference Range & Units 09/24/23 05:56 09/24/23 12:13 09/24/23 17:00 09/24/23 20:46  Glucose-Capillary 70 - 99 mg/dL 161 (H)    40 units Semglee  @0850  241 (H)  5 units Novolog   250 (H)  5 units Novolog   243 (H)  (H): Data is abnormally high  Latest Reference Range & Units 09/25/23 07:39  Glucose-Capillary 70 - 99 mg/dL 096 (H)  (H): Data is abnormally high     Home DM Meds: Basaglar  65 units every day Fiasp  40 units with dinner Metformin  500 mg BID Freestyle Libre 3 CGM   Current Orders: Semglee  40 units daily Novolog  Moderate Correction Scale/ SSI (0-15 units) TID AC    AM CBGs look good on current dose of Semglee --would not adjust Semglee  at this point  Afternoon CBGs elevated   MD- Please consider adding Novolog  Meal Coverage: Novolog  5 units TID with meals HOLD if pt NPO HOLD if pt eats <50% meals  Note pt will be on the IV Insulin  Drip for Surgery Wednesday     --Will follow patient during hospitalization--  Langston Pippins RN, MSN, CDCES Diabetes Coordinator Inpatient Glycemic Control Team Team Pager: [phone] (8a-5p)

## 2023-09-25 NOTE — Assessment & Plan Note (Addendum)
 Stable to mildly elevated. Continue management per specialists.  - Continue carvedilol  - Discontinued amlodipine  and irbesartan   - Hold hydrochlorothiazide  due to CKD

## 2023-09-25 NOTE — Progress Notes (Signed)
 PHARMACY - ANTICOAGULATION CONSULT NOTE  Pharmacy Consult for heparin  Indication: multivessel CAD  Allergies  Allergen Reactions   Trulicity [Dulaglutide] Other (See Comments)    Patient indicated this caused pancreatitis.     Patient Measurements: Height: 5\' 4"  (162.6 cm) Weight: 97.1 kg (214 lb) IBW/kg (Calculated) : 59.2 HEPARIN  DW (KG): 80.9  Vital Signs: Temp: 98.4 F (36.9 C) (06/02 0818) Temp Source: Oral (06/02 0818) BP: 153/77 (06/02 0818) Pulse Rate: 72 (06/02 0818)  Labs: Recent Labs    09/23/23 0353 09/23/23 0354 09/24/23 0357 09/25/23 0420  HGB 12.5*  --  12.9* 12.1*  HCT 38.8*  --  39.9 38.5*  PLT 241  --  283 244  HEPARINUNFRC  --  0.41 0.51 0.43  CREATININE 1.98*  --  2.21* 2.04*    Estimated Creatinine Clearance: 33.9 mL/min (A) (by C-G formula based on SCr of 2.04 mg/dL (H)).  Assessment: 73 yo M presenting with chest pain without relief after taking nitroglycerin . Patient was started on IV heparin  for ACS. Patient is s/p cath on 5/29, found to have severe multivessel disease.  Pharmacy consulted to resume IV heparin  on 5/29 PM.  Heparin  level therapeutic (0.43) on 1250 units/hour. CBC stable. No bleeding documented.    Goal of Therapy:  Heparin  level 0.3-0.7 units/ml Monitor platelets by anticoagulation protocol: Yes   Plan:  Continue IV heparin  at 1250 units/hr Daily heparin  level and CBC Monitor for s/sx of bleeding  CABG planned for 6/4  Thank you for involving pharmacy in this patient's care.  Caroline Cinnamon, PharmD, BCPS Clinical Pharmacist Clinical phone for 09/25/2023 is [phone] 09/25/2023 9:36 AM

## 2023-09-25 NOTE — Assessment & Plan Note (Addendum)
 S/p heart cath which showed multivessel stenosis. Continuing medical optimization per CT-surg and cardiology recs. Plan for cath on 6/4 - Cardiology following -  CTS following - Continue nitro drip and heparin  drip - A.m. BMP, Mag, CBC - Replete K as needed - Will need low-dose spironolactone  at discharge, hold on starting SGLT2 inhibitor at discharge due to HgbA1c > 15.

## 2023-09-25 NOTE — Assessment & Plan Note (Addendum)
 T2DM: A1c> 15, not compliant with home regimen, on 40 units LAI daily. Continue SSI moderate. Fasting sugars now 160 or lower, CTM CKDIII: Creatinine up trending 1.98 > 2.21 > 2.04, avoid nephrotoxic meds.

## 2023-09-25 NOTE — Assessment & Plan Note (Signed)
 Possible 8 mm right lung nodule on CXR at admission. Repeat CXR showed no definitive nodule.

## 2023-09-25 NOTE — Progress Notes (Signed)
     Daily Progress Note Intern Pager: [phone]  Patient name: [name] Medical record number: [id number] Date of birth: 06-03-1950 Age: 73 y.o. Gender: male  Primary Care Provider: Clem Currier, DO Consultants: CT surg, Cardiology Code Status: Full  Pt Overview and Major Events to Date:  5/29: Admitted, cardiac cath completed  Assessment and Plan:  This is a 73 year old male patient with a PMH of uncontrolled T2DM and hypertension, previous stent placement admitted for NSTEMI now status postcardiac cath with stents of stenosis of all vessels.  Currently under medical optimization pending CABG tentatively planned for June 4 Assessment & Plan NSTEMI (non-ST elevated myocardial infarction) [facility]) S/p heart cath which showed multivessel stenosis. Continuing medical optimization per CT-surg and cardiology recs. Plan for cath on 6/4 - Cardiology following -  CTS following - Continue nitro drip and heparin  drip - A.m. BMP, Mag, CBC - Replete K as needed - Will need low-dose spironolactone  at discharge, hold on starting SGLT2 inhibitor at discharge due to HgbA1c > 15.  Essential hypertension, benign Stable to mildly elevated. Continue management per specialists.  - Continue carvedilol  - Discontinued amlodipine  and irbesartan   - Hold hydrochlorothiazide  due to CKD Pulmonary nodule Possible 8 mm right lung nodule on CXR at admission. Repeat CXR showed no definitive nodule.   Chronic health problem T2DM: A1c> 15, not compliant with home regimen, on 40 units LAI daily. Continue SSI moderate. Fasting sugars now 160 or lower, CTM CKDIII: Creatinine up trending 1.98 > 2.21 > 2.04, avoid nephrotoxic meds.   FEN/GI: Heart healthy PPx: Heparin  Dispo:Pending PT recommendations  pending CABG.   Subjective:  Patient sitting in chair on exam, eating breakfast. Feeling well no complaints.   Objective: Temp:  [98.1 F (36.7 C)-98.6 F (37 C)] 98.4 F (36.9 C) (06/02 0457) Pulse Rate:   [65-74] 65 (06/02 0457) Resp:  [12-18] 18 (06/02 0457) BP: (108-150)/(59-85) 141/85 (06/02 0457) SpO2:  [96 %-100 %] 100 % (06/02 0457) Physical Exam: General: Well appearing, no distress Cardiovascular: RRR, no m/r/g Respiratory: CTAB, no increased WOB Abdomen: Flat, soft, nontender Extremities: 2+ pulses bilaterally, no evidence of edema  Laboratory: Most recent CBC Lab Results  Component Value Date   WBC 7.5 09/25/2023   HGB 12.1 (L) 09/25/2023   HCT 38.5 (L) 09/25/2023   MCV 91.7 09/25/2023   PLT 244 09/25/2023   Most recent BMP    Latest Ref Rng & Units 09/25/2023    4:20 AM  BMP  Glucose 70 - 99 mg/dL 308   BUN 8 - 23 mg/dL 31   Creatinine 6.57 - 1.24 mg/dL 8.46   Sodium 962 - 952 mmol/L 140   Potassium 3.5 - 5.1 mmol/L 4.5   Chloride 98 - 111 mmol/L 108   CO2 22 - 32 mmol/L 23   Calcium  8.9 - 10.3 mg/dL 8.4     Rayma Calandra, DO 09/25/2023, 7:36 AM  PGY-1, [facility] FPTS Intern pager: [phone], text pages welcome Secure chat group [facility] Teaching Service

## 2023-09-25 NOTE — Progress Notes (Addendum)
  Progress Note  Patient Name: [name] Date of Encounter: 09/25/2023 [facility] Cardiologist: Hazle Lites, MD   Interval Summary   No chest pain or shortness of breath.  Resting comfortably.    Vital Signs Vitals:   09/24/23 2042 09/24/23 2206 09/25/23 0457 09/25/23 0818  BP: (!) 145/59 (!) 150/79 (!) 141/85 (!) 153/77  Pulse:  74 65 72  Resp: 18 18 18 20   Temp: 98.3 F (36.8 C) 98.1 F (36.7 C) 98.4 F (36.9 C) 98.4 F (36.9 C)  TempSrc: Oral Oral Oral Oral  SpO2:  100% 100% 100%  Weight:      Height:        Intake/Output Summary (Last 24 hours) at 09/25/2023 1019 Last data filed at 09/25/2023 0819 Gross per 24 hour  Intake 528 ml  Output 1550 ml  Net -1022 ml      09/21/2023    7:54 AM 09/05/2023    4:33 PM 09/05/2023    3:03 PM  Last 3 Weights  Weight (lbs) 214 lb 214 lb 9.6 oz 214 lb 9.6 oz  Weight (kg) 97.07 kg 97.342 kg 97.342 kg      Telemetry/ECG  Sinus rhythm heart rates in the 70s- Personally Reviewed  Physical Exam  GEN: No acute distress.   Neck: Mild JVD Cardiac: RRR, no murmurs, rubs, or gallops.  Respiratory: Clear to auscultation bilaterally. GI: Soft, nontender, non-distended  MS: No edema  Patient Profile Patient with past medical history significant for reported CAD with PCI to LAD 2005, heart failure with mildly reduced EF, hyperten be on reduced congestionsion, diabetes, hyperlipidemia.  Patient here for NSTEMI, noted to have MVD, plan for CABG Wednesday, 09/27/2023  Assessment & Plan   NSTEMI EKG with lateral T wave inversions.  Troponins greater than 9700.  Underwent left heart catheterization indicating severe multivessel disease.   No chest pain or shortness of breath today. Continue with IV heparin , aspirin , carvedilol  25 mg twice daily Plan for CABG 09/27/2023.   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  Echocardiogram October  2023.  This admission shows further decline EF 35 to 40% with global hypokinesis.  RV was not well-visualized.  Aortic sclerosis noted.  He was to be euvolemic/asymptomatic and with rising creatinine not pushing diuresis. Entresto  stopped due to elevated creatinine levels.  We can titrate GDMT more aggressively postcardiac catheterization.    AKI on CKD Creatinine on arrival 1.54.  Creatinine yesterday 2.21 today is 2.04.  Would ensure he has follow-up with nephrology if he has not already beenbeing seen.  Hyperlipidemia Last LDL was 6 months ago.  LDL 256.  Will repeat today.  Depending on this likely will need lipid clinic referral.  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 With underlying renal disease rosuvastatin  40 mg not ideal, will transition this to atorvastatin 80 mg.  Diabetes Uncontrolled A1c, 11.3% 3 days ago.  Needs more aggressive management.  Defer to primary team.  Right lung nodule 8 mm.  Further management per primary team.     For questions or updates, please contact [facility] Please consult [email or web address] for contact info under       Signed, Burnetta Cart, PA-C

## 2023-09-25 NOTE — Plan of Care (Signed)

## 2023-09-25 NOTE — Plan of Care (Signed)
  Problem: Coping: Goal: Ability to adjust to condition or change in health will improve Outcome: Progressing   Problem: Fluid Volume: Goal: Ability to maintain a balanced intake and output will improve Outcome: Progressing   Problem: Education: Goal: Ability to describe self-care measures that may prevent or decrease complications (Diabetes Survival Skills Education) will improve Outcome: Progressing Goal: Individualized Educational Video(s) Outcome: Progressing

## 2023-09-25 NOTE — Progress Notes (Signed)
 4 Days Post-Op Procedure(s) (LRB): LEFT HEART CATH AND CORONARY ANGIOGRAPHY (N/A) Subjective: No complaints this morning  Objective: Vital signs in last 24 hours: Temp:  [98.1 F (36.7 C)-98.6 F (37 C)] 98.4 F (36.9 C) (06/02 0457) Pulse Rate:  [65-74] 65 (06/02 0457) Cardiac Rhythm: Normal sinus rhythm;Bundle branch block (06/01 1905) Resp:  [12-18] 18 (06/02 0457) BP: (108-150)/(59-85) 141/85 (06/02 0457) SpO2:  [96 %-100 %] 100 % (06/02 0457)  Hemodynamic parameters for last 24 hours:    Intake/Output from previous day: 06/01 0701 - 06/02 0700 In: 331.6 [I.V.:331.6] Out: 1400 [Urine:1400] Intake/Output this shift: No intake/output data recorded.  General appearance: alert, cooperative, and no distress Neurologic: intact Heart: regular rate and rhythm Lungs: clear to auscultation bilaterally  Lab Results: Recent Labs    09/24/23 0357 09/25/23 0420  WBC 7.8 7.5  HGB 12.9* 12.1*  HCT 39.9 38.5*  PLT 283 244   BMET:  Recent Labs    09/24/23 0357 09/25/23 0420  NA 140 140  K 3.6 4.5  CL 104 108  CO2 26 23  GLUCOSE 144* 166*  BUN 37* 31*  CREATININE 2.21* 2.04*  CALCIUM  8.9 8.4*    PT/INR: No results for input(s): "LABPROT", "INR" in the last 72 hours. ABG    Component Value Date/Time   HCO3 24.7 08/15/2020 1221   TCO2 26 08/15/2020 1221   O2SAT 93.0 08/15/2020 1221   CBG (last 3)  Recent Labs    09/24/23 2046 09/25/23 0602 09/25/23 0739  GLUCAP 243* 147* 112*    Assessment/Plan: S/P Procedure(s) (LRB): LEFT HEART CATH AND CORONARY ANGIOGRAPHY (N/A) 3 vessel CAD, s/p non-STEMI No CP or SOB CKD with acute worsening of creatinine in setting of diuresis  Creatinine down from 2.2 to 2.04 CBG better controlled For CABG Wednesday   LOS: 4 days    [name] 09/25/2023

## 2023-09-26 ENCOUNTER — Ambulatory Visit: Admitting: Pharmacist

## 2023-09-26 DIAGNOSIS — I255 Ischemic cardiomyopathy: Secondary | ICD-10-CM | POA: Diagnosis not present

## 2023-09-26 DIAGNOSIS — I2510 Atherosclerotic heart disease of native coronary artery without angina pectoris: Secondary | ICD-10-CM | POA: Diagnosis not present

## 2023-09-26 DIAGNOSIS — E7800 Pure hypercholesterolemia, unspecified: Secondary | ICD-10-CM | POA: Diagnosis not present

## 2023-09-26 DIAGNOSIS — Z794 Long term (current) use of insulin: Secondary | ICD-10-CM | POA: Diagnosis not present

## 2023-09-26 DIAGNOSIS — I259 Chronic ischemic heart disease, unspecified: Secondary | ICD-10-CM | POA: Diagnosis not present

## 2023-09-26 DIAGNOSIS — I5043 Acute on chronic combined systolic (congestive) and diastolic (congestive) heart failure: Secondary | ICD-10-CM | POA: Diagnosis not present

## 2023-09-26 DIAGNOSIS — I10 Essential (primary) hypertension: Secondary | ICD-10-CM | POA: Diagnosis not present

## 2023-09-26 DIAGNOSIS — E1165 Type 2 diabetes mellitus with hyperglycemia: Secondary | ICD-10-CM | POA: Diagnosis not present

## 2023-09-26 DIAGNOSIS — E7841 Elevated Lipoprotein(a): Secondary | ICD-10-CM | POA: Diagnosis not present

## 2023-09-26 DIAGNOSIS — I214 Non-ST elevation (NSTEMI) myocardial infarction: Secondary | ICD-10-CM | POA: Diagnosis not present

## 2023-09-26 DIAGNOSIS — N179 Acute kidney failure, unspecified: Secondary | ICD-10-CM | POA: Diagnosis not present

## 2023-09-26 DIAGNOSIS — I240 Acute coronary thrombosis not resulting in myocardial infarction: Secondary | ICD-10-CM | POA: Diagnosis not present

## 2023-09-26 LAB — CBC
HCT: 37.9 % — ABNORMAL LOW (ref 39.0–52.0)
Hemoglobin: 11.9 g/dL — ABNORMAL LOW (ref 13.0–17.0)
MCH: 28.3 pg (ref 26.0–34.0)
MCHC: 31.4 g/dL (ref 30.0–36.0)
MCV: 90.2 fL (ref 80.0–100.0)
Platelets: 270 10*3/uL (ref 150–400)
RBC: 4.2 MIL/uL — ABNORMAL LOW (ref 4.22–5.81)
RDW: 12.3 % (ref 11.5–15.5)
WBC: 7.1 10*3/uL (ref 4.0–10.5)
nRBC: 0 % (ref 0.0–0.2)

## 2023-09-26 LAB — URINALYSIS, ROUTINE W REFLEX MICROSCOPIC
Bilirubin Urine: NEGATIVE
Glucose, UA: 50 mg/dL — AB
Hgb urine dipstick: NEGATIVE
Ketones, ur: NEGATIVE mg/dL
Leukocytes,Ua: NEGATIVE
Nitrite: NEGATIVE
Protein, ur: >=300 mg/dL — AB
Specific Gravity, Urine: 1.012 (ref 1.005–1.030)
pH: 5 (ref 5.0–8.0)

## 2023-09-26 LAB — APTT: aPTT: 83 s — ABNORMAL HIGH (ref 24–36)

## 2023-09-26 LAB — BLOOD GAS, ARTERIAL
Acid-Base Excess: 3.5 mmol/L — ABNORMAL HIGH (ref 0.0–2.0)
Bicarbonate: 27.8 mmol/L (ref 20.0–28.0)
O2 Saturation: 99 %
Patient temperature: 36.8
pCO2 arterial: 40 mmHg (ref 32–48)
pH, Arterial: 7.45 (ref 7.35–7.45)
pO2, Arterial: 133 mmHg — ABNORMAL HIGH (ref 83–108)

## 2023-09-26 LAB — GLUCOSE, CAPILLARY
Glucose-Capillary: 176 mg/dL — ABNORMAL HIGH (ref 70–99)
Glucose-Capillary: 139 mg/dL — ABNORMAL HIGH (ref 70–99)
Glucose-Capillary: 154 mg/dL — ABNORMAL HIGH (ref 70–99)
Glucose-Capillary: 121 mg/dL — ABNORMAL HIGH (ref 70–99)

## 2023-09-26 LAB — LIPID PANEL
Cholesterol: 214 mg/dL — ABNORMAL HIGH (ref 0–200)
HDL: 59 mg/dL (ref >40)
LDL Cholesterol: 121 mg/dL — ABNORMAL HIGH (ref 0–99)
Total CHOL/HDL Ratio: 3.6 ratio
Triglycerides: 169 mg/dL — ABNORMAL HIGH (ref <150)
VLDL: 34 mg/dL (ref 0–40)

## 2023-09-26 LAB — BASIC METABOLIC PANEL WITH GFR
Anion gap: 9 (ref 5–15)
BUN: 25 mg/dL — ABNORMAL HIGH (ref 8–23)
CO2: 25 mmol/L (ref 22–32)
Calcium: 8.9 mg/dL (ref 8.9–10.3)
Chloride: 106 mmol/L (ref 98–111)
Creatinine, Ser: 1.85 mg/dL — ABNORMAL HIGH (ref 0.61–1.24)
GFR, Estimated: 38 mL/min — ABNORMAL LOW (ref >60)
Glucose, Bld: 221 mg/dL — ABNORMAL HIGH (ref 70–99)
Potassium: 4.3 mmol/L (ref 3.5–5.1)
Sodium: 140 mmol/L (ref 135–145)

## 2023-09-26 LAB — ABO/RH: ABO/RH(D): O POS

## 2023-09-26 LAB — SURGICAL PCR SCREEN
MRSA, PCR: NEGATIVE
Staphylococcus aureus: NEGATIVE

## 2023-09-26 LAB — HEPARIN LEVEL (UNFRACTIONATED): Heparin Unfractionated: 0.56 [IU]/mL (ref 0.30–0.70)

## 2023-09-26 MED ORDER — HYDRALAZINE HCL 10 MG PO TABS
10.0000 mg | ORAL_TABLET | Freq: Three times a day (TID) | ORAL | Status: DC
  Administered 2023-09-26 (×2): 10 mg via ORAL
  Filled 2023-09-26 (×2): qty 1

## 2023-09-26 MED ORDER — EPINEPHRINE HCL 5 MG/250ML IV SOLN IN NS
0.0000 ug/min | INTRAVENOUS | Status: AC
  Administered 2023-09-27: 1 ug/min via INTRAVENOUS
  Filled 2023-09-26: qty 250

## 2023-09-26 MED ORDER — PLASMA-LYTE A IV SOLN
INTRAVENOUS | Status: DC
  Filled 2023-09-26: qty 2.5

## 2023-09-26 MED ORDER — BISACODYL 5 MG PO TBEC
5.0000 mg | DELAYED_RELEASE_TABLET | Freq: Once | ORAL | Status: AC
  Administered 2023-09-26: 5 mg via ORAL
  Filled 2023-09-26: qty 1

## 2023-09-26 MED ORDER — CEFAZOLIN SODIUM-DEXTROSE 2-4 GM/100ML-% IV SOLN
2.0000 g | INTRAVENOUS | Status: AC
  Administered 2023-09-27 (×2): 2 g via INTRAVENOUS
  Filled 2023-09-26: qty 100

## 2023-09-26 MED ORDER — MILRINONE LACTATE IN DEXTROSE 20-5 MG/100ML-% IV SOLN
0.3000 ug/kg/min | INTRAVENOUS | Status: AC
  Administered 2023-09-27: .25 ug/kg/min via INTRAVENOUS
  Filled 2023-09-26: qty 100

## 2023-09-26 MED ORDER — MAGNESIUM SULFATE 50 % IJ SOLN
40.0000 meq | INTRAMUSCULAR | Status: DC
  Filled 2023-09-26: qty 9.85

## 2023-09-26 MED ORDER — CHLORHEXIDINE GLUCONATE CLOTH 2 % EX PADS
6.0000 | MEDICATED_PAD | Freq: Once | CUTANEOUS | Status: AC
  Administered 2023-09-26: 6 via TOPICAL

## 2023-09-26 MED ORDER — PHENYLEPHRINE HCL-NACL 20-0.9 MG/250ML-% IV SOLN
30.0000 ug/min | INTRAVENOUS | Status: AC
  Administered 2023-09-27: 30 ug/min via INTRAVENOUS
  Filled 2023-09-26: qty 250

## 2023-09-26 MED ORDER — NITROGLYCERIN IN D5W 200-5 MCG/ML-% IV SOLN
2.0000 ug/min | INTRAVENOUS | Status: DC
  Administered 2023-09-27: 15 ug/min via INTRAVENOUS
  Filled 2023-09-26: qty 250

## 2023-09-26 MED ORDER — POTASSIUM CHLORIDE 2 MEQ/ML IV SOLN
80.0000 meq | INTRAVENOUS | Status: DC
  Filled 2023-09-26: qty 40

## 2023-09-26 MED ORDER — NOREPINEPHRINE 4 MG/250ML-% IV SOLN
0.0000 ug/min | INTRAVENOUS | Status: AC
  Administered 2023-09-27: 10 ug/min via INTRAVENOUS
  Filled 2023-09-26: qty 250

## 2023-09-26 MED ORDER — METOPROLOL TARTRATE 12.5 MG HALF TABLET
12.5000 mg | ORAL_TABLET | Freq: Once | ORAL | Status: AC
  Administered 2023-09-27: 12.5 mg via ORAL
  Filled 2023-09-26: qty 1

## 2023-09-26 MED ORDER — INSULIN ASPART 100 UNIT/ML IJ SOLN: 3.0000 [IU] | Freq: Three times a day (TID) | INTRAMUSCULAR | Status: DC

## 2023-09-26 MED ORDER — HEPARIN 30,000 UNITS/1000 ML (OHS) CELLSAVER SOLUTION
Status: DC
  Filled 2023-09-26: qty 1000

## 2023-09-26 MED ORDER — CHLORHEXIDINE GLUCONATE 0.12 % MT SOLN
15.0000 mL | Freq: Once | OROMUCOSAL | Status: AC
  Administered 2023-09-27: 15 mL via OROMUCOSAL
  Filled 2023-09-26: qty 15

## 2023-09-26 MED ORDER — INSULIN REGULAR(HUMAN) IN NACL 100-0.9 UT/100ML-% IV SOLN
INTRAVENOUS | Status: AC
  Administered 2023-09-27: 1.4 [IU]/h via INTRAVENOUS
  Filled 2023-09-26: qty 100

## 2023-09-26 MED ORDER — TRANEXAMIC ACID (OHS) BOLUS VIA INFUSION
15.0000 mg/kg | INTRAVENOUS | Status: AC
  Administered 2023-09-27: 1456.5 mg via INTRAVENOUS
  Filled 2023-09-26: qty 1457

## 2023-09-26 MED ORDER — TRANEXAMIC ACID 1000 MG/10ML IV SOLN
1.5000 mg/kg/h | INTRAVENOUS | Status: AC
  Administered 2023-09-27: 1.5 mg/kg/h via INTRAVENOUS
  Filled 2023-09-26: qty 25

## 2023-09-26 MED ORDER — VANCOMYCIN HCL 1.5 G IV SOLR
1500.0000 mg | INTRAVENOUS | Status: AC
  Administered 2023-09-27: 1500 mg via INTRAVENOUS
  Filled 2023-09-26: qty 30

## 2023-09-26 MED ORDER — TRANEXAMIC ACID (OHS) PUMP PRIME SOLUTION
2.0000 mg/kg | INTRAVENOUS | Status: DC
  Filled 2023-09-26: qty 1.94

## 2023-09-26 MED ORDER — INSULIN ASPART 100 UNIT/ML IJ SOLN
5.0000 [IU] | Freq: Three times a day (TID) | INTRAMUSCULAR | Status: DC
  Administered 2023-09-26 (×2): 5 [IU] via SUBCUTANEOUS

## 2023-09-26 MED ORDER — DIAZEPAM 2 MG PO TABS
2.0000 mg | ORAL_TABLET | Freq: Once | ORAL | Status: AC
  Administered 2023-09-27: 2 mg via ORAL
  Filled 2023-09-26: qty 1

## 2023-09-26 MED ORDER — CEFAZOLIN SODIUM-DEXTROSE 2-4 GM/100ML-% IV SOLN
2.0000 g | INTRAVENOUS | Status: DC
  Filled 2023-09-26: qty 100

## 2023-09-26 MED ORDER — ~~LOC~~ CARDIAC SURGERY, PATIENT & FAMILY EDUCATION
Freq: Once | Status: AC
  Filled 2023-09-26: qty 1

## 2023-09-26 MED ORDER — DEXMEDETOMIDINE HCL IN NACL 400 MCG/100ML IV SOLN
0.1000 ug/kg/h | INTRAVENOUS | Status: AC
  Administered 2023-09-27: .3 ug/kg/h via INTRAVENOUS
  Filled 2023-09-26: qty 100

## 2023-09-26 NOTE — Progress Notes (Signed)
 Mobility Specialist Progress Note:   09/26/23 0913  Mobility  Activity Ambulated with assistance in room;Ambulated with assistance in hallway  Level of Assistance Modified independent, requires aide device or extra time  Assistive Device Other (Comment) (IV pole)  Distance Ambulated (ft) 400 ft  Activity Response Tolerated well  Mobility Referral Yes  Mobility visit 1 Mobility  Mobility Specialist Start Time (ACUTE ONLY) 0900  Mobility Specialist Stop Time (ACUTE ONLY) 0912  Mobility Specialist Time Calculation (min) (ACUTE ONLY) 12 min   Pt received in bed, agreeable to mobility session. Ambulated in hallway with ModI using IV pole. Tolerated well, VSS throughout. Returned pt to room, physician at bedside, all needs met.   Kaio Kuhlman Mobility Specialist Please contact via Special educational needs teacher or  Rehab office at [phone]

## 2023-09-26 NOTE — Progress Notes (Signed)
 PHARMACY - ANTICOAGULATION CONSULT NOTE  Pharmacy Consult for heparin  Indication: multivessel CAD  Allergies  Allergen Reactions   Trulicity [Dulaglutide] Other (See Comments)    Patient indicated this caused pancreatitis.     Patient Measurements: Height: 5\' 4"  (162.6 cm) Weight: 97.1 kg (214 lb) IBW/kg (Calculated) : 59.2 HEPARIN  DW (KG): 80.9  Vital Signs: Temp: 98.5 F (36.9 C) (06/03 0300) Temp Source: Oral (06/03 0300) BP: 153/87 (06/03 0300) Pulse Rate: 73 (06/03 0300)  Labs: Recent Labs    09/24/23 0357 09/25/23 0420 09/26/23 0351  HGB 12.9* 12.1* 11.9*  HCT 39.9 38.5* 37.9*  PLT 283 244 270  HEPARINUNFRC 0.51 0.43 0.56  CREATININE 2.21* 2.04*  --     Estimated Creatinine Clearance: 33.9 mL/min (A) (by C-G formula based on SCr of 2.04 mg/dL (H)).  Assessment: 73 yo M presenting with chest pain without relief after taking nitroglycerin . Patient was started on IV heparin  for ACS. Patient is s/p cath on 5/29, found to have severe multivessel disease.  Pharmacy consulted to resume IV heparin  on 5/29 PM.  Heparin  level therapeutic (0.56) on 1250 units/hour. CBC stable. No bleeding documented.    Goal of Therapy:  Heparin  level 0.3-0.7 units/ml Monitor platelets by anticoagulation protocol: Yes   Plan:  Continue IV heparin  at 1250 units/hr Daily heparin  level and CBC Monitor for s/sx of bleeding  CABG planned for 6/4  Thank you for involving pharmacy in this patient's care.  Caroline Cinnamon, PharmD, BCPS Clinical Pharmacist Clinical phone for 09/26/2023 is [phone] 09/26/2023 8:06 AM

## 2023-09-26 NOTE — Plan of Care (Signed)
  Problem: Fluid Volume: Goal: Ability to maintain a balanced intake and output will improve Outcome: Progressing   Problem: Coping: Goal: Ability to adjust to condition or change in health will improve Outcome: Progressing   Problem: Metabolic: Goal: Ability to maintain appropriate glucose levels will improve Outcome: Progressing   Problem: Nutritional: Goal: Maintenance of adequate nutrition will improve Outcome: Progressing Goal: Progress toward achieving an optimal weight will improve Outcome: Progressing   Problem: Skin Integrity: Goal: Risk for impaired skin integrity will decrease Outcome: Progressing   Problem: Tissue Perfusion: Goal: Adequacy of tissue perfusion will improve Outcome: Progressing   Problem: Education: Goal: Knowledge of General Education information will improve Description: Including pain rating scale, medication(s)/side effects and non-pharmacologic comfort measures Outcome: Progressing

## 2023-09-26 NOTE — Progress Notes (Signed)
   09/26/23 0953  TOC Brief Assessment  Insurance and Status Reviewed  Patient has primary care physician Yes  Home environment has been reviewed home w/ spouse  Prior level of function: self w/ assist  Prior/Current Home Services No current home services  Social Drivers of Health Review SDOH reviewed no interventions necessary  Readmission risk has been reviewed Yes  Transition of care needs no transition of care needs at this time    Note plan for CABG -OR on 6/4,  We will continue to monitor patient advancement through interdisciplinary progression rounds post op. If new patient transition needs arise, please place a TOC consult.

## 2023-09-26 NOTE — Progress Notes (Signed)
     Daily Progress Note Intern Pager: [phone]  Patient name: [name] Medical record number: [id number] Date of birth: Jan 07, 1951 Age: 73 y.o. Gender: male  Primary Care Provider: Clem Currier, DO Consultants: CT surg, Cardiology Code Status: Full  Pt Overview and Major Events to Date:  Have/29: Admitted, cardiac cath completed  Assessment and Plan:  Is a 73 year old male patient with a PMH of uncontrolled T2DM and hypertension, previous stent placement, admitted for NSTEMI now status post cardiac cath with significant stenosis of all vessels.  Only under medical optimization pending CABG plan for Wednesday, June 4. Assessment & Plan NSTEMI (non-ST elevated myocardial infarction) [facility]) S/p heart cath which showed multivessel stenosis. Continuing medical optimization per CT-surg and cardiology recs. Plan for cath on 6/4 - Cardiology following -  CTS following - Continue nitro drip and heparin  drip - A.m. BMP, Mag, CBC - Replete K as needed - Will need low-dose spironolactone  at discharge, hold on starting SGLT2 inhibitor at discharge due to HgbA1c > 15.  Essential hypertension, benign Stable to mildly elevated. Continue management per specialists.  - Continue carvedilol  - Discontinued amlodipine  and irbesartan   - Hold hydrochlorothiazide  due to CKD Pulmonary nodule Possible 8 mm right lung nodule on CXR at admission. Repeat CXR showed no definitive nodule.   Chronic health problem T2DM: A1c> 15, not compliant with home regimen, on 40 units LAI daily. Added 5 u SAI TID WC. Continue SSI moderate. Fasting sugars now 160 or lower, CTM CKDIII: Creatinine up trending 1.98 > 2.21 > 2.04, avoid nephrotoxic meds.    FEN/GI: Heart healthy, carb modified PPx: Heparin  Dispo:Pending PT recommendations  pending CABG.   Subjective:  Up and ambulating with mobility specialist on exam. Feels nervous for procedure tomorrow.  Objective: Temp:  [97.9 F (36.6 C)-98.5 F (36.9 C)] 98.5  F (36.9 C) (06/03 0300) Pulse Rate:  [71-73] 73 (06/03 0300) Resp:  [14-20] 20 (06/03 0300) BP: (118-168)/(58-93) 153/87 (06/03 0300) SpO2:  [97 %-100 %] 97 % (06/03 0300) Physical Exam: General: Well appearing elderly gentleman, no distress Cardiovascular: RRR, no m/r/g  Laboratory: Most recent CBC Lab Results  Component Value Date   WBC 7.1 09/26/2023   HGB 11.9 (L) 09/26/2023   HCT 37.9 (L) 09/26/2023   MCV 90.2 09/26/2023   PLT 270 09/26/2023   Most recent BMP    Latest Ref Rng & Units 09/25/2023    4:20 AM  BMP  Glucose 70 - 99 mg/dL 147   BUN 8 - 23 mg/dL 31   Creatinine 8.29 - 1.24 mg/dL 5.62   Sodium 130 - 865 mmol/L 140   Potassium 3.5 - 5.1 mmol/L 4.5   Chloride 98 - 111 mmol/L 108   CO2 22 - 32 mmol/L 23   Calcium  8.9 - 10.3 mg/dL 8.4     Rayma Calandra, DO 09/26/2023, 7:44 AM  PGY-1, [facility] FPTS Intern pager: [phone], text pages welcome Secure chat group [facility] Teaching Service

## 2023-09-26 NOTE — Assessment & Plan Note (Addendum)
 T2DM: A1c> 15, not compliant with home regimen, on 40 units LAI daily. Added 5 u SAI TID WC. Continue SSI moderate. Fasting sugars now 160 or lower, CTM CKDIII: Creatinine up trending 1.98 > 2.21 > 2.04, avoid nephrotoxic meds.

## 2023-09-26 NOTE — Progress Notes (Signed)
 5 Days Post-Op Procedure(s) (LRB): LEFT HEART CATH AND CORONARY ANGIOGRAPHY (N/A) Subjective: No complaints Anxious about surgery  Objective: Vital signs in last 24 hours: Temp:  [97.9 F (36.6 C)-98.7 F (37.1 C)] 98.7 F (37.1 C) (06/03 0809) Pulse Rate:  [71-78] 77 (06/03 0842) Cardiac Rhythm: Normal sinus rhythm;Other (Comment) (06/03 0706) Resp:  [14-20] 17 (06/03 0842) BP: (118-168)/(58-93) 150/78 (06/03 0842) SpO2:  [97 %-99 %] 99 % (06/03 0842)  Hemodynamic parameters for last 24 hours:    Intake/Output from previous day: 06/02 0701 - 06/03 0700 In: 302.6 [P.O.:240; I.V.:62.6] Out: 1800 [Urine:1800] Intake/Output this shift: No intake/output data recorded.  General appearance: alert, cooperative, and no distress Neurologic: intact Heart: regular rate and rhythm Lungs: clear to auscultation bilaterally  Lab Results: Recent Labs    09/25/23 0420 09/26/23 0351  WBC 7.5 7.1  HGB 12.1* 11.9*  HCT 38.5* 37.9*  PLT 244 270   BMET:  Recent Labs    09/24/23 0357 09/25/23 0420  NA 140 140  K 3.6 4.5  CL 104 108  CO2 26 23  GLUCOSE 144* 166*  BUN 37* 31*  CREATININE 2.21* 2.04*  CALCIUM  8.9 8.4*    PT/INR: No results for input(s): "LABPROT", "INR" in the last 72 hours. ABG    Component Value Date/Time   HCO3 24.7 08/15/2020 1221   TCO2 26 08/15/2020 1221   O2SAT 93.0 08/15/2020 1221   CBG (last 3)  Recent Labs    09/25/23 1648 09/25/23 2059 09/26/23 0601  GLUCAP 222* 222* 176*    Assessment/Plan: S/P Procedure(s) (LRB): LEFT HEART CATH AND CORONARY ANGIOGRAPHY (N/A) - Severe 3 vessel CAD with impaired LV function For CABG tomorrow Recheck creatinine All questions answered   LOS: 5 days    [name] 09/26/2023

## 2023-09-26 NOTE — Assessment & Plan Note (Signed)
 S/p heart cath which showed multivessel stenosis. Continuing medical optimization per CT-surg and cardiology recs. Plan for cath on 6/4 - Cardiology following -  CTS following - Continue nitro drip and heparin  drip - A.m. BMP, Mag, CBC - Replete K as needed - Will need low-dose spironolactone  at discharge, hold on starting SGLT2 inhibitor at discharge due to HgbA1c > 15.

## 2023-09-26 NOTE — Assessment & Plan Note (Signed)
 Stable to mildly elevated. Continue management per specialists.  - Continue carvedilol  - Discontinued amlodipine  and irbesartan   - Hold hydrochlorothiazide  due to CKD

## 2023-09-26 NOTE — Progress Notes (Signed)
  Progress Note  Patient Name: [name] Date of Encounter: 09/26/2023 [facility] Cardiologist: Hazle Lites, MD   Interval Summary   Patient sleeping but I spoke with the nurse and he has no complaints.  Walked with mobility today.  No chest pain or shortness of breath.  Vital Signs Vitals:   09/25/23 2322 09/26/23 0300 09/26/23 0809 09/26/23 0842  BP: (!) 168/93 (!) 153/87 (!) 160/76 (!) 150/78  Pulse: 71 73 78 77  Resp: 15 20 20 17   Temp: 98 F (36.7 C) 98.5 F (36.9 C) 98.7 F (37.1 C)   TempSrc: Oral Oral Oral   SpO2: 97% 97% 98% 99%  Weight:      Height:        Intake/Output Summary (Last 24 hours) at 09/26/2023 1056 Last data filed at 09/26/2023 0900 Gross per 24 hour  Intake 290.04 ml  Output 1650 ml  Net -1359.96 ml      09/21/2023    7:54 AM 09/05/2023    4:33 PM 09/05/2023    3:03 PM  Last 3 Weights  Weight (lbs) 214 lb 214 lb 9.6 oz 214 lb 9.6 oz  Weight (kg) 97.07 kg 97.342 kg 97.342 kg      Telemetry/ECG  Sinus rhythm heart rates in the 70s, PACs- Personally Reviewed  Physical Exam  GEN: No acute distress.   Neck: Mild JVD Cardiac: RRR, no murmurs, rubs, or gallops.  Respiratory: Clear to auscultation bilaterally. GI: Soft, nontender, non-distended  MS: No edema  Patient Profile Patient with past medical history significant for reported CAD with PCI to LAD 2005, heart failure with mildly reduced EF, hyperten be on reduced congestionsion, diabetes, hyperlipidemia.  Patient here for NSTEMI, noted to have MVD, plan for CABG Wednesday, 09/27/2023  Assessment & Plan   NSTEMI MV CAD EKG with lateral T wave inversions.  Troponins greater than 9700.  Underwent left heart catheterization indicating severe multivessel disease.   No chest pain or shortness of breath.  Continue with IV heparin , aspirin , carvedilol  25 mg twice daily Plan for CABG 09/27/2023.   Mixed cardiomyopathy with reduced EF  Previously with mildly reduced EF, etiology thought  related to hypertension.  Echocardiogram October 2023.  This admission shows further decline EF 35 to 40% with global hypokinesis.  RV was not well-visualized.  Aortic sclerosis noted.  Overall euvolemic without any significant complaints.  With rising creatinine levels we have not been pushing diuresis further. Entresto  stopped due to elevated creatinine levels.  We can titrate GDMT more aggressively postcardiac catheterization.    AKI on CKD Creatinine as high as 2.21 in the setting of diuresis.  Now downtrending.  Currently 1.85 today.  Hyperlipidemia Familial hypercholesterolemia Last LDL was 6 months ago,LDL 256.  LDL this admission 121.  His target goal should be less than 55. Continue with atorvastatin 80 mg, at discharge refer to lipid clinic for consideration of PCSK9 inhibitor. LP(a) 582  Diabetes Uncontrolled A1c, 11.3% 3 days ago.  Needs more aggressive management.  Defer to primary team.  Right lung nodule 8 mm.  Further management per primary team.     For questions or updates, please contact [facility] Please consult [email or web address] for contact info under       Signed, Burnetta Cart, PA-C

## 2023-09-26 NOTE — Assessment & Plan Note (Signed)
 Possible 8 mm right lung nodule on CXR at admission. Repeat CXR showed no definitive nodule.

## 2023-09-27 ENCOUNTER — Other Ambulatory Visit: Payer: Self-pay

## 2023-09-27 ENCOUNTER — Encounter (HOSPITAL_COMMUNITY)
Admission: EM | Disposition: A | Discharge status: Home Health | Payer: Self-pay | Source: Home / Self Care | Attending: Thoracic Surgery (Cardiothoracic Vascular Surgery)

## 2023-09-27 ENCOUNTER — Inpatient Hospital Stay (HOSPITAL_COMMUNITY)

## 2023-09-27 ENCOUNTER — Encounter (HOSPITAL_COMMUNITY): Payer: Self-pay | Admitting: Student

## 2023-09-27 ENCOUNTER — Inpatient Hospital Stay (HOSPITAL_COMMUNITY): Payer: Self-pay | Admitting: Certified Registered Nurse Anesthetist

## 2023-09-27 DIAGNOSIS — I2510 Atherosclerotic heart disease of native coronary artery without angina pectoris: Secondary | ICD-10-CM

## 2023-09-27 DIAGNOSIS — Z951 Presence of aortocoronary bypass graft: Secondary | ICD-10-CM

## 2023-09-27 DIAGNOSIS — Z48812 Encounter for surgical aftercare following surgery on the circulatory system: Secondary | ICD-10-CM | POA: Diagnosis not present

## 2023-09-27 DIAGNOSIS — I130 Hypertensive heart and chronic kidney disease with heart failure and stage 1 through stage 4 chronic kidney disease, or unspecified chronic kidney disease: Secondary | ICD-10-CM

## 2023-09-27 DIAGNOSIS — N183 Chronic kidney disease, stage 3 unspecified: Secondary | ICD-10-CM

## 2023-09-27 DIAGNOSIS — Z4682 Encounter for fitting and adjustment of non-vascular catheter: Secondary | ICD-10-CM | POA: Diagnosis not present

## 2023-09-27 DIAGNOSIS — I214 Non-ST elevation (NSTEMI) myocardial infarction: Secondary | ICD-10-CM | POA: Diagnosis not present

## 2023-09-27 DIAGNOSIS — I5043 Acute on chronic combined systolic (congestive) and diastolic (congestive) heart failure: Secondary | ICD-10-CM

## 2023-09-27 DIAGNOSIS — I25118 Atherosclerotic heart disease of native coronary artery with other forms of angina pectoris: Secondary | ICD-10-CM

## 2023-09-27 DIAGNOSIS — J9811 Atelectasis: Secondary | ICD-10-CM | POA: Diagnosis not present

## 2023-09-27 HISTORY — PX: INTRAOPERATIVE TRANSESOPHAGEAL ECHOCARDIOGRAM: SHX5062

## 2023-09-27 HISTORY — PX: CORONARY ARTERY BYPASS GRAFT: SHX141

## 2023-09-27 LAB — BASIC METABOLIC PANEL WITH GFR
Anion gap: 8 (ref 5–15)
Anion gap: 9 (ref 5–15)
BUN: 24 mg/dL — ABNORMAL HIGH (ref 8–23)
BUN: 19 mg/dL (ref 8–23)
CO2: 26 mmol/L (ref 22–32)
CO2: 24 mmol/L (ref 22–32)
Calcium: 9.1 mg/dL (ref 8.9–10.3)
Calcium: 8.1 mg/dL — ABNORMAL LOW (ref 8.9–10.3)
Chloride: 106 mmol/L (ref 98–111)
Chloride: 109 mmol/L (ref 98–111)
Creatinine, Ser: 1.89 mg/dL — ABNORMAL HIGH (ref 0.61–1.24)
Creatinine, Ser: 1.94 mg/dL — ABNORMAL HIGH (ref 0.61–1.24)
GFR, Estimated: 37 mL/min — ABNORMAL LOW (ref >60)
GFR, Estimated: 36 mL/min — ABNORMAL LOW (ref >60)
Glucose, Bld: 102 mg/dL — ABNORMAL HIGH (ref 70–99)
Glucose, Bld: 150 mg/dL — ABNORMAL HIGH (ref 70–99)
Potassium: 4.2 mmol/L (ref 3.5–5.1)
Potassium: 4.7 mmol/L (ref 3.5–5.1)
Sodium: 140 mmol/L (ref 135–145)
Sodium: 142 mmol/L (ref 135–145)

## 2023-09-27 LAB — POCT I-STAT 7, (LYTES, BLD GAS, ICA,H+H)
Acid-Base Excess: 0 mmol/L (ref 0.0–2.0)
Acid-Base Excess: 4 mmol/L — ABNORMAL HIGH (ref 0.0–2.0)
Acid-Base Excess: 0 mmol/L (ref 0.0–2.0)
Acid-base deficit: 2 mmol/L (ref 0.0–2.0)
Bicarbonate: 25.8 mmol/L (ref 20.0–28.0)
Bicarbonate: 27.4 mmol/L (ref 20.0–28.0)
Bicarbonate: 24.4 mmol/L (ref 20.0–28.0)
Bicarbonate: 23.2 mmol/L (ref 20.0–28.0)
Calcium, Ion: 1.24 mmol/L (ref 1.15–1.40)
Calcium, Ion: 0.96 mmol/L — ABNORMAL LOW (ref 1.15–1.40)
Calcium, Ion: 1.19 mmol/L (ref 1.15–1.40)
Calcium, Ion: 1.14 mmol/L — ABNORMAL LOW (ref 1.15–1.40)
HCT: 34 % — ABNORMAL LOW (ref 39.0–52.0)
HCT: 24 % — ABNORMAL LOW (ref 39.0–52.0)
HCT: 34 % — ABNORMAL LOW (ref 39.0–52.0)
HCT: 28 % — ABNORMAL LOW (ref 39.0–52.0)
Hemoglobin: 11.6 g/dL — ABNORMAL LOW (ref 13.0–17.0)
Hemoglobin: 8.2 g/dL — ABNORMAL LOW (ref 13.0–17.0)
Hemoglobin: 11.6 g/dL — ABNORMAL LOW (ref 13.0–17.0)
Hemoglobin: 9.5 g/dL — ABNORMAL LOW (ref 13.0–17.0)
O2 Saturation: 100 %
O2 Saturation: 100 %
O2 Saturation: 100 %
O2 Saturation: 99 %
Patient temperature: 35.6
Potassium: 4.2 mmol/L (ref 3.5–5.1)
Potassium: 4.6 mmol/L (ref 3.5–5.1)
Potassium: 4.8 mmol/L (ref 3.5–5.1)
Potassium: 4.5 mmol/L (ref 3.5–5.1)
Sodium: 140 mmol/L (ref 135–145)
Sodium: 141 mmol/L (ref 135–145)
Sodium: 140 mmol/L (ref 135–145)
Sodium: 142 mmol/L (ref 135–145)
TCO2: 27 mmol/L (ref 22–32)
TCO2: 29 mmol/L (ref 22–32)
TCO2: 26 mmol/L (ref 22–32)
TCO2: 24 mmol/L (ref 22–32)
pCO2 arterial: 45.2 mmHg (ref 32–48)
pCO2 arterial: 37.3 mmHg (ref 32–48)
pCO2 arterial: 38.8 mmHg (ref 32–48)
pCO2 arterial: 39.7 mmHg (ref 32–48)
pH, Arterial: 7.365 (ref 7.35–7.45)
pH, Arterial: 7.474 — ABNORMAL HIGH (ref 7.35–7.45)
pH, Arterial: 7.408 (ref 7.35–7.45)
pH, Arterial: 7.367 (ref 7.35–7.45)
pO2, Arterial: 395 mmHg — ABNORMAL HIGH (ref 83–108)
pO2, Arterial: 445 mmHg — ABNORMAL HIGH (ref 83–108)
pO2, Arterial: 453 mmHg — ABNORMAL HIGH (ref 83–108)
pO2, Arterial: 145 mmHg — ABNORMAL HIGH (ref 83–108)

## 2023-09-27 LAB — POCT I-STAT, CHEM 8
BUN: 23 mg/dL (ref 8–23)
BUN: 22 mg/dL (ref 8–23)
BUN: 20 mg/dL (ref 8–23)
BUN: 20 mg/dL (ref 8–23)
BUN: 19 mg/dL (ref 8–23)
BUN: 20 mg/dL (ref 8–23)
BUN: 21 mg/dL (ref 8–23)
BUN: 21 mg/dL (ref 8–23)
BUN: 20 mg/dL (ref 8–23)
Calcium, Ion: 1.25 mmol/L (ref 1.15–1.40)
Calcium, Ion: 1.23 mmol/L (ref 1.15–1.40)
Calcium, Ion: 0.98 mmol/L — ABNORMAL LOW (ref 1.15–1.40)
Calcium, Ion: 0.98 mmol/L — ABNORMAL LOW (ref 1.15–1.40)
Calcium, Ion: 0.99 mmol/L — ABNORMAL LOW (ref 1.15–1.40)
Calcium, Ion: 1.02 mmol/L — ABNORMAL LOW (ref 1.15–1.40)
Calcium, Ion: 0.98 mmol/L — ABNORMAL LOW (ref 1.15–1.40)
Calcium, Ion: 1.01 mmol/L — ABNORMAL LOW (ref 1.15–1.40)
Calcium, Ion: 1.19 mmol/L (ref 1.15–1.40)
Chloride: 107 mmol/L (ref 98–111)
Chloride: 105 mmol/L (ref 98–111)
Chloride: 102 mmol/L (ref 98–111)
Chloride: 104 mmol/L (ref 98–111)
Chloride: 102 mmol/L (ref 98–111)
Chloride: 103 mmol/L (ref 98–111)
Chloride: 106 mmol/L (ref 98–111)
Chloride: 107 mmol/L (ref 98–111)
Chloride: 107 mmol/L (ref 98–111)
Creatinine, Ser: 1.9 mg/dL — ABNORMAL HIGH (ref 0.61–1.24)
Creatinine, Ser: 1.9 mg/dL — ABNORMAL HIGH (ref 0.61–1.24)
Creatinine, Ser: 1.6 mg/dL — ABNORMAL HIGH (ref 0.61–1.24)
Creatinine, Ser: 1.6 mg/dL — ABNORMAL HIGH (ref 0.61–1.24)
Creatinine, Ser: 1.7 mg/dL — ABNORMAL HIGH (ref 0.61–1.24)
Creatinine, Ser: 1.7 mg/dL — ABNORMAL HIGH (ref 0.61–1.24)
Creatinine, Ser: 1.6 mg/dL — ABNORMAL HIGH (ref 0.61–1.24)
Creatinine, Ser: 1.7 mg/dL — ABNORMAL HIGH (ref 0.61–1.24)
Creatinine, Ser: 1.7 mg/dL — ABNORMAL HIGH (ref 0.61–1.24)
Glucose, Bld: 94 mg/dL (ref 70–99)
Glucose, Bld: 90 mg/dL (ref 70–99)
Glucose, Bld: 74 mg/dL (ref 70–99)
Glucose, Bld: 77 mg/dL (ref 70–99)
Glucose, Bld: 79 mg/dL (ref 70–99)
Glucose, Bld: 93 mg/dL (ref 70–99)
Glucose, Bld: 108 mg/dL — ABNORMAL HIGH (ref 70–99)
Glucose, Bld: 148 mg/dL — ABNORMAL HIGH (ref 70–99)
Glucose, Bld: 144 mg/dL — ABNORMAL HIGH (ref 70–99)
HCT: 34 % — ABNORMAL LOW (ref 39.0–52.0)
HCT: 29 % — ABNORMAL LOW (ref 39.0–52.0)
HCT: 20 % — ABNORMAL LOW (ref 39.0–52.0)
HCT: 21 % — ABNORMAL LOW (ref 39.0–52.0)
HCT: 20 % — ABNORMAL LOW (ref 39.0–52.0)
HCT: 24 % — ABNORMAL LOW (ref 39.0–52.0)
HCT: 20 % — ABNORMAL LOW (ref 39.0–52.0)
HCT: 20 % — ABNORMAL LOW (ref 39.0–52.0)
HCT: 33 % — ABNORMAL LOW (ref 39.0–52.0)
Hemoglobin: 11.6 g/dL — ABNORMAL LOW (ref 13.0–17.0)
Hemoglobin: 9.9 g/dL — ABNORMAL LOW (ref 13.0–17.0)
Hemoglobin: 6.8 g/dL — CL (ref 13.0–17.0)
Hemoglobin: 7.1 g/dL — ABNORMAL LOW (ref 13.0–17.0)
Hemoglobin: 6.8 g/dL — CL (ref 13.0–17.0)
Hemoglobin: 8.2 g/dL — ABNORMAL LOW (ref 13.0–17.0)
Hemoglobin: 6.8 g/dL — CL (ref 13.0–17.0)
Hemoglobin: 6.8 g/dL — CL (ref 13.0–17.0)
Hemoglobin: 11.2 g/dL — ABNORMAL LOW (ref 13.0–17.0)
Potassium: 4.2 mmol/L (ref 3.5–5.1)
Potassium: 4.4 mmol/L (ref 3.5–5.1)
Potassium: 4.2 mmol/L (ref 3.5–5.1)
Potassium: 5.2 mmol/L — ABNORMAL HIGH (ref 3.5–5.1)
Potassium: 5.5 mmol/L — ABNORMAL HIGH (ref 3.5–5.1)
Potassium: 5.5 mmol/L — ABNORMAL HIGH (ref 3.5–5.1)
Potassium: 5.2 mmol/L — ABNORMAL HIGH (ref 3.5–5.1)
Potassium: 5.7 mmol/L — ABNORMAL HIGH (ref 3.5–5.1)
Potassium: 4.8 mmol/L (ref 3.5–5.1)
Sodium: 142 mmol/L (ref 135–145)
Sodium: 140 mmol/L (ref 135–145)
Sodium: 140 mmol/L (ref 135–145)
Sodium: 141 mmol/L (ref 135–145)
Sodium: 139 mmol/L (ref 135–145)
Sodium: 140 mmol/L (ref 135–145)
Sodium: 141 mmol/L (ref 135–145)
Sodium: 141 mmol/L (ref 135–145)
Sodium: 140 mmol/L (ref 135–145)
TCO2: 26 mmol/L (ref 22–32)
TCO2: 27 mmol/L (ref 22–32)
TCO2: 29 mmol/L (ref 22–32)
TCO2: 29 mmol/L (ref 22–32)
TCO2: 29 mmol/L (ref 22–32)
TCO2: 31 mmol/L (ref 22–32)
TCO2: 28 mmol/L (ref 22–32)
TCO2: 29 mmol/L (ref 22–32)
TCO2: 29 mmol/L (ref 22–32)

## 2023-09-27 LAB — CBC
HCT: 38.5 % — ABNORMAL LOW (ref 39.0–52.0)
HCT: 30.7 % — ABNORMAL LOW (ref 39.0–52.0)
HCT: 28.1 % — ABNORMAL LOW (ref 39.0–52.0)
Hemoglobin: 12.2 g/dL — ABNORMAL LOW (ref 13.0–17.0)
Hemoglobin: 9.7 g/dL — ABNORMAL LOW (ref 13.0–17.0)
Hemoglobin: 8.9 g/dL — ABNORMAL LOW (ref 13.0–17.0)
MCH: 28.6 pg (ref 26.0–34.0)
MCH: 28.4 pg (ref 26.0–34.0)
MCH: 28.3 pg (ref 26.0–34.0)
MCHC: 31.7 g/dL (ref 30.0–36.0)
MCHC: 31.6 g/dL (ref 30.0–36.0)
MCHC: 31.7 g/dL (ref 30.0–36.0)
MCV: 90.4 fL (ref 80.0–100.0)
MCV: 90 fL (ref 80.0–100.0)
MCV: 89.5 fL (ref 80.0–100.0)
Platelets: 280 10*3/uL (ref 150–400)
Platelets: 154 10*3/uL (ref 150–400)
Platelets: 162 10*3/uL (ref 150–400)
RBC: 4.26 MIL/uL (ref 4.22–5.81)
RBC: 3.41 MIL/uL — ABNORMAL LOW (ref 4.22–5.81)
RBC: 3.14 MIL/uL — ABNORMAL LOW (ref 4.22–5.81)
RDW: 12.6 % (ref 11.5–15.5)
RDW: 14.1 % (ref 11.5–15.5)
RDW: 14.2 % (ref 11.5–15.5)
WBC: 8.1 10*3/uL (ref 4.0–10.5)
WBC: 10 10*3/uL (ref 4.0–10.5)
WBC: 8.8 10*3/uL (ref 4.0–10.5)
nRBC: 0 % (ref 0.0–0.2)
nRBC: 0 % (ref 0.0–0.2)
nRBC: 0 % (ref 0.0–0.2)

## 2023-09-27 LAB — GLUCOSE, CAPILLARY
Glucose-Capillary: 97 mg/dL (ref 70–99)
Glucose-Capillary: 134 mg/dL — ABNORMAL HIGH (ref 70–99)
Glucose-Capillary: 125 mg/dL — ABNORMAL HIGH (ref 70–99)
Glucose-Capillary: 134 mg/dL — ABNORMAL HIGH (ref 70–99)
Glucose-Capillary: 111 mg/dL — ABNORMAL HIGH (ref 70–99)
Glucose-Capillary: 145 mg/dL — ABNORMAL HIGH (ref 70–99)

## 2023-09-27 LAB — HEPARIN LEVEL (UNFRACTIONATED): Heparin Unfractionated: 0.56 [IU]/mL (ref 0.30–0.70)

## 2023-09-27 LAB — HEMOGLOBIN AND HEMATOCRIT, BLOOD
HCT: 21.4 % — ABNORMAL LOW (ref 39.0–52.0)
Hemoglobin: 6.8 g/dL — CL (ref 13.0–17.0)

## 2023-09-27 LAB — MAGNESIUM: Magnesium: 2.1 mg/dL (ref 1.7–2.4)

## 2023-09-27 LAB — PLATELET COUNT: Platelets: 148 10*3/uL — ABNORMAL LOW (ref 150–400)

## 2023-09-27 LAB — APTT: aPTT: 33 s (ref 24–36)

## 2023-09-27 LAB — PREPARE RBC (CROSSMATCH)

## 2023-09-27 LAB — PROTIME-INR
INR: 1.3 — ABNORMAL HIGH (ref 0.8–1.2)
Prothrombin Time: 16.4 s — ABNORMAL HIGH (ref 11.4–15.2)

## 2023-09-27 SURGERY — CORONARY ARTERY BYPASS GRAFTING (CABG)
Anesthesia: General | Site: Chest

## 2023-09-27 MED ORDER — NICARDIPINE HCL IN NACL 20-0.86 MG/200ML-% IV SOLN
3.0000 mg/h | INTRAVENOUS | Status: DC
  Administered 2023-09-27: 5 mg/h via INTRAVENOUS
  Administered 2023-09-28: 2.5 mg/h via INTRAVENOUS
  Filled 2023-09-27: qty 200

## 2023-09-27 MED ORDER — ORAL CARE MOUTH RINSE: 15.0000 mL | OROMUCOSAL | Status: DC | PRN

## 2023-09-27 MED ORDER — ONDANSETRON HCL 4 MG/2ML IJ SOLN
INTRAMUSCULAR | Status: DC | PRN
  Administered 2023-09-27: 4 mg via INTRAVENOUS

## 2023-09-27 MED ORDER — ORAL CARE MOUTH RINSE
15.0000 mL | OROMUCOSAL | Status: DC
  Administered 2023-09-27 – 2023-09-28 (×7): 15 mL via OROMUCOSAL

## 2023-09-27 MED ORDER — EPINEPHRINE HCL 5 MG/250ML IV SOLN IN NS
0.5000 ug/min | INTRAVENOUS | Status: DC
  Administered 2023-09-28 – 2023-09-29 (×2): 3 ug/min via INTRAVENOUS
  Administered 2023-10-01: 5 ug/min via INTRAVENOUS
  Filled 2023-09-27 (×4): qty 250

## 2023-09-27 MED ORDER — METOPROLOL TARTRATE 12.5 MG HALF TABLET: 12.5000 mg | ORAL_TABLET | Freq: Two times a day (BID) | ORAL | Status: DC

## 2023-09-27 MED ORDER — EPHEDRINE 5 MG/ML INJ
INTRAVENOUS | Status: AC
  Filled 2023-09-27: qty 5

## 2023-09-27 MED ORDER — ACETAMINOPHEN 160 MG/5ML PO SOLN
1000.0000 mg | Freq: Four times a day (QID) | ORAL | Status: AC
  Administered 2023-09-27 – 2023-10-01 (×12): 1000 mg
  Filled 2023-09-27 (×13): qty 40.6

## 2023-09-27 MED ORDER — TRANEXAMIC ACID 1000 MG/10ML IV SOLN
1.5000 mg/kg/h | INTRAVENOUS | Status: DC
  Filled 2023-09-27: qty 25

## 2023-09-27 MED ORDER — TRAMADOL HCL 50 MG PO TABS
50.0000 mg | ORAL_TABLET | ORAL | Status: DC | PRN
  Administered 2023-09-28 (×2): 50 mg via ORAL
  Filled 2023-09-27 (×2): qty 1

## 2023-09-27 MED ORDER — FENTANYL CITRATE (PF) 250 MCG/5ML IJ SOLN
INTRAMUSCULAR | Status: AC
Start: 2023-09-27 — End: ?
  Filled 2023-09-27: qty 5

## 2023-09-27 MED ORDER — LACTATED RINGERS IV SOLN: INTRAVENOUS | Status: DC | PRN

## 2023-09-27 MED ORDER — HEPARIN SODIUM (PORCINE) 1000 UNIT/ML IJ SOLN
INTRAMUSCULAR | Status: DC | PRN
  Administered 2023-09-27: 35000 [IU] via INTRAVENOUS
  Administered 2023-09-27: 2000 [IU] via INTRAVENOUS

## 2023-09-27 MED ORDER — NITROGLYCERIN 0.2 MG/ML ON CALL CATH LAB
INTRAVENOUS | Status: DC | PRN
  Administered 2023-09-27: 20 ug via INTRAVENOUS

## 2023-09-27 MED ORDER — DOCUSATE SODIUM 100 MG PO CAPS
200.0000 mg | ORAL_CAPSULE | Freq: Every day | ORAL | Status: DC
  Administered 2023-09-28 – 2023-09-29 (×2): 200 mg via ORAL
  Filled 2023-09-27 (×2): qty 2

## 2023-09-27 MED ORDER — PAPAVERINE HCL 30 MG/ML IJ SOLN: INTRAMUSCULAR | Status: DC | PRN

## 2023-09-27 MED ORDER — DEXTROSE 50 % IV SOLN
0.0000 mL | INTRAVENOUS | Status: DC | PRN
  Filled 2023-09-27 (×2): qty 50

## 2023-09-27 MED ORDER — CHLORHEXIDINE GLUCONATE CLOTH 2 % EX PADS
6.0000 | MEDICATED_PAD | Freq: Every day | CUTANEOUS | Status: DC
Start: 2023-09-27 — End: 2023-10-08
  Administered 2023-09-27 – 2023-10-07 (×11): 6 via TOPICAL

## 2023-09-27 MED ORDER — SODIUM CHLORIDE 0.9 % IV SOLN: INTRAVENOUS | Status: AC

## 2023-09-27 MED ORDER — PANTOPRAZOLE SODIUM 40 MG PO TBEC
40.0000 mg | DELAYED_RELEASE_TABLET | Freq: Every day | ORAL | Status: DC
  Administered 2023-09-29: 40 mg via ORAL
  Filled 2023-09-27: qty 1

## 2023-09-27 MED ORDER — MAGNESIUM SULFATE 4 GM/100ML IV SOLN
4.0000 g | Freq: Once | INTRAVENOUS | Status: AC
  Administered 2023-09-27: 4 g via INTRAVENOUS
  Filled 2023-09-27: qty 100

## 2023-09-27 MED ORDER — FENTANYL CITRATE (PF) 250 MCG/5ML IJ SOLN
INTRAMUSCULAR | Status: AC
  Filled 2023-09-27: qty 5

## 2023-09-27 MED ORDER — BISACODYL 5 MG PO TBEC
10.0000 mg | DELAYED_RELEASE_TABLET | Freq: Every day | ORAL | Status: DC
  Administered 2023-09-29 – 2023-10-13 (×11): 10 mg via ORAL
  Filled 2023-09-27 (×12): qty 2

## 2023-09-27 MED ORDER — NICARDIPINE HCL IN NACL 20-0.86 MG/200ML-% IV SOLN
INTRAVENOUS | Status: AC
  Filled 2023-09-27: qty 200

## 2023-09-27 MED ORDER — HEPARIN SODIUM (PORCINE) 1000 UNIT/ML IJ SOLN
INTRAMUSCULAR | Status: AC
  Filled 2023-09-27: qty 1

## 2023-09-27 MED ORDER — MIDAZOLAM HCL 2 MG/2ML IJ SOLN
INTRAMUSCULAR | Status: AC
  Filled 2023-09-27: qty 2

## 2023-09-27 MED ORDER — ORAL CARE MOUTH RINSE: 15.0000 mL | Freq: Once | OROMUCOSAL | Status: DC

## 2023-09-27 MED ORDER — METOCLOPRAMIDE HCL 5 MG/ML IJ SOLN
10.0000 mg | Freq: Four times a day (QID) | INTRAMUSCULAR | Status: AC
  Administered 2023-09-27 – 2023-09-29 (×6): 10 mg via INTRAVENOUS
  Filled 2023-09-27 (×6): qty 2

## 2023-09-27 MED ORDER — VANCOMYCIN HCL IN DEXTROSE 1-5 GM/200ML-% IV SOLN
1000.0000 mg | Freq: Once | INTRAVENOUS | Status: AC
  Administered 2023-09-27: 1000 mg via INTRAVENOUS
  Filled 2023-09-27: qty 200

## 2023-09-27 MED ORDER — DEXMEDETOMIDINE HCL IN NACL 400 MCG/100ML IV SOLN
0.0000 ug/kg/h | INTRAVENOUS | Status: DC
  Administered 2023-09-27 – 2023-09-28 (×2): 0.7 ug/kg/h via INTRAVENOUS
  Filled 2023-09-27 (×2): qty 100

## 2023-09-27 MED ORDER — PROTAMINE SULFATE 10 MG/ML IV SOLN
INTRAVENOUS | Status: DC | PRN
  Administered 2023-09-27: 20 mg via INTRAVENOUS
  Administered 2023-09-27: 350 mg via INTRAVENOUS

## 2023-09-27 MED ORDER — PHENYLEPHRINE 80 MCG/ML (10ML) SYRINGE FOR IV PUSH (FOR BLOOD PRESSURE SUPPORT)
PREFILLED_SYRINGE | INTRAVENOUS | Status: DC | PRN
  Administered 2023-09-27: 80 ug via INTRAVENOUS

## 2023-09-27 MED ORDER — NOREPINEPHRINE 4 MG/250ML-% IV SOLN: 0.0000 ug/min | INTRAVENOUS | Status: DC

## 2023-09-27 MED ORDER — PROPOFOL 10 MG/ML IV BOLUS
INTRAVENOUS | Status: DC | PRN
  Administered 2023-09-27 (×2): 50 mg via INTRAVENOUS

## 2023-09-27 MED ORDER — EPHEDRINE SULFATE-NACL 50-0.9 MG/10ML-% IV SOSY
PREFILLED_SYRINGE | INTRAVENOUS | Status: DC | PRN
  Administered 2023-09-27: 2.5 mg via INTRAVENOUS

## 2023-09-27 MED ORDER — LACTATED RINGERS IV SOLN: INTRAVENOUS | Status: DC

## 2023-09-27 MED ORDER — SODIUM BICARBONATE 8.4 % IV SOLN
INTRAVENOUS | Status: DC
  Filled 2023-09-27: qty 25

## 2023-09-27 MED ORDER — 0.9 % SODIUM CHLORIDE (POUR BTL) OPTIME
TOPICAL | Status: DC | PRN
  Administered 2023-09-27: 6000 mL

## 2023-09-27 MED ORDER — POTASSIUM CHLORIDE 10 MEQ/50ML IV SOLN: 10.0000 meq | INTRAVENOUS | Status: AC

## 2023-09-27 MED ORDER — ROCURONIUM BROMIDE 10 MG/ML (PF) SYRINGE
PREFILLED_SYRINGE | INTRAVENOUS | Status: AC
  Filled 2023-09-27: qty 20

## 2023-09-27 MED ORDER — PHENYLEPHRINE HCL-NACL 20-0.9 MG/250ML-% IV SOLN: 0.0000 ug/min | INTRAVENOUS | Status: DC

## 2023-09-27 MED ORDER — MORPHINE SULFATE (PF) 2 MG/ML IV SOLN
1.0000 mg | INTRAVENOUS | Status: DC | PRN
  Administered 2023-09-27 (×2): 2 mg via INTRAVENOUS
  Administered 2023-09-27: 4 mg via INTRAVENOUS
  Administered 2023-09-28 (×2): 2 mg via INTRAVENOUS
  Filled 2023-09-27 (×3): qty 2

## 2023-09-27 MED ORDER — CALCIUM CHLORIDE 10 % IV SOLN
INTRAVENOUS | Status: DC | PRN
  Administered 2023-09-27: 100 mg via INTRAVENOUS

## 2023-09-27 MED ORDER — CLEVIDIPINE BUTYRATE 0.5 MG/ML IV EMUL
INTRAVENOUS | Status: AC
  Filled 2023-09-27: qty 50

## 2023-09-27 MED ORDER — LIDOCAINE 2% (20 MG/ML) 5 ML SYRINGE
INTRAMUSCULAR | Status: AC
  Filled 2023-09-27: qty 5

## 2023-09-27 MED ORDER — SODIUM CHLORIDE 0.9 % IV SOLN: 250.0000 mL | INTRAVENOUS | Status: AC

## 2023-09-27 MED ORDER — ROCURONIUM BROMIDE 10 MG/ML (PF) SYRINGE
PREFILLED_SYRINGE | INTRAVENOUS | Status: DC | PRN
  Administered 2023-09-27: 70 mg via INTRAVENOUS
  Administered 2023-09-27: 50 mg via INTRAVENOUS
  Administered 2023-09-27: 30 mg via INTRAVENOUS
  Administered 2023-09-27: 20 mg via INTRAVENOUS

## 2023-09-27 MED ORDER — ALBUMIN HUMAN 5 % IV SOLN
250.0000 mL | INTRAVENOUS | Status: DC | PRN
  Administered 2023-09-27 (×4): 12.5 g via INTRAVENOUS
  Filled 2023-09-27 (×3): qty 250

## 2023-09-27 MED ORDER — PROPOFOL 500 MG/50ML IV EMUL
INTRAVENOUS | Status: DC | PRN
Start: 2023-09-27 — End: 2023-09-27
  Administered 2023-09-27: 50 ug/kg/min via INTRAVENOUS

## 2023-09-27 MED ORDER — SODIUM CHLORIDE 0.45 % IV SOLN: INTRAVENOUS | Status: AC | PRN

## 2023-09-27 MED ORDER — ONDANSETRON HCL 4 MG/2ML IJ SOLN
4.0000 mg | Freq: Four times a day (QID) | INTRAMUSCULAR | Status: DC | PRN
  Administered 2023-09-30: 4 mg via INTRAVENOUS
  Filled 2023-09-27: qty 2

## 2023-09-27 MED ORDER — ACETAMINOPHEN 500 MG PO TABS
1000.0000 mg | ORAL_TABLET | Freq: Four times a day (QID) | ORAL | Status: AC
  Administered 2023-09-28 – 2023-10-02 (×6): 1000 mg via ORAL
  Filled 2023-09-27 (×6): qty 2

## 2023-09-27 MED ORDER — SODIUM CHLORIDE 0.9% FLUSH: 3.0000 mL | INTRAVENOUS | Status: DC | PRN

## 2023-09-27 MED ORDER — ASPIRIN 81 MG PO CHEW
324.0000 mg | CHEWABLE_TABLET | Freq: Every day | ORAL | Status: DC
  Administered 2023-09-30 – 2023-10-01 (×2): 324 mg
  Filled 2023-09-27 (×2): qty 4

## 2023-09-27 MED ORDER — HEPARIN SODIUM (PORCINE) 1000 UNIT/ML IJ SOLN
INTRAMUSCULAR | Status: AC
  Filled 2023-09-27: qty 10

## 2023-09-27 MED ORDER — FENTANYL CITRATE (PF) 250 MCG/5ML IJ SOLN
INTRAMUSCULAR | Status: DC | PRN
  Administered 2023-09-27 (×2): 100 ug via INTRAVENOUS
  Administered 2023-09-27: 50 ug via INTRAVENOUS
  Administered 2023-09-27: 200 ug via INTRAVENOUS
  Administered 2023-09-27 (×3): 100 ug via INTRAVENOUS

## 2023-09-27 MED ORDER — ASPIRIN 325 MG PO TBEC
325.0000 mg | DELAYED_RELEASE_TABLET | Freq: Every day | ORAL | Status: DC
  Administered 2023-09-28 – 2023-09-29 (×2): 325 mg via ORAL
  Filled 2023-09-27 (×2): qty 1

## 2023-09-27 MED ORDER — OXYCODONE HCL 5 MG PO TABS
5.0000 mg | ORAL_TABLET | ORAL | Status: DC | PRN
  Administered 2023-09-28 – 2023-09-30 (×8): 10 mg via ORAL
  Filled 2023-09-27: qty 1
  Filled 2023-09-27 (×6): qty 2
  Filled 2023-09-27: qty 1
  Filled 2023-09-27: qty 2

## 2023-09-27 MED ORDER — PROTAMINE SULFATE 10 MG/ML IV SOLN
INTRAVENOUS | Status: AC
Start: 2023-09-27 — End: ?
  Filled 2023-09-27: qty 25

## 2023-09-27 MED ORDER — LACTATED RINGERS IV SOLN: INTRAVENOUS | Status: AC

## 2023-09-27 MED ORDER — MIDAZOLAM HCL 2 MG/2ML IJ SOLN
2.0000 mg | INTRAMUSCULAR | Status: DC | PRN
  Filled 2023-09-27: qty 2

## 2023-09-27 MED ORDER — BISACODYL 10 MG RE SUPP
10.0000 mg | Freq: Every day | RECTAL | Status: DC
  Filled 2023-09-27: qty 1

## 2023-09-27 MED ORDER — GELATIN ABSORBABLE MT POWD: OROMUCOSAL | Status: DC | PRN

## 2023-09-27 MED ORDER — PANTOPRAZOLE SODIUM 40 MG IV SOLR
40.0000 mg | Freq: Every day | INTRAVENOUS | Status: AC
Start: 2023-09-27 — End: 2023-09-29
  Administered 2023-09-27 – 2023-09-28 (×2): 40 mg via INTRAVENOUS
  Filled 2023-09-27 (×2): qty 10

## 2023-09-27 MED ORDER — METHADONE HCL IV SYRINGE 10 MG/ML FOR CABG
0.2000 mg/kg | Freq: Once | INTRAMUSCULAR | Status: AC
  Administered 2023-09-27: 12 mg via INTRAVENOUS
  Filled 2023-09-27: qty 1.2

## 2023-09-27 MED ORDER — MIDAZOLAM HCL (PF) 5 MG/ML IJ SOLN
INTRAMUSCULAR | Status: DC | PRN
  Administered 2023-09-27 (×2): 2 mg via INTRAVENOUS

## 2023-09-27 MED ORDER — ACETAMINOPHEN 160 MG/5ML PO SOLN
650.0000 mg | Freq: Once | ORAL | Status: AC
  Administered 2023-09-27: 650 mg
  Filled 2023-09-27: qty 20.3

## 2023-09-27 MED ORDER — PROPOFOL 1000 MG/100ML IV EMUL
0.0000 ug/kg/min | INTRAVENOUS | Status: DC
  Administered 2023-09-27: 20 ug/kg/min via INTRAVENOUS
  Filled 2023-09-27 (×2): qty 100

## 2023-09-27 MED ORDER — SODIUM CHLORIDE 0.9% FLUSH
3.0000 mL | Freq: Two times a day (BID) | INTRAVENOUS | Status: AC
Start: 2023-09-28 — End: ?
  Administered 2023-09-28 – 2023-10-05 (×16): 3 mL via INTRAVENOUS

## 2023-09-27 MED ORDER — CHLORHEXIDINE GLUCONATE 0.12 % MT SOLN
15.0000 mL | OROMUCOSAL | Status: AC
  Administered 2023-09-27: 15 mL via OROMUCOSAL
  Filled 2023-09-27: qty 15

## 2023-09-27 MED ORDER — CHLORHEXIDINE GLUCONATE 0.12 % MT SOLN: 15.0000 mL | Freq: Once | OROMUCOSAL | Status: DC

## 2023-09-27 MED ORDER — CEFAZOLIN SODIUM-DEXTROSE 2-4 GM/100ML-% IV SOLN
2.0000 g | Freq: Three times a day (TID) | INTRAVENOUS | Status: AC
  Administered 2023-09-27 – 2023-09-29 (×6): 2 g via INTRAVENOUS
  Filled 2023-09-27 (×6): qty 100

## 2023-09-27 MED ORDER — METOPROLOL TARTRATE 25 MG/10 ML ORAL SUSPENSION: 12.5000 mg | Freq: Two times a day (BID) | ORAL | Status: DC

## 2023-09-27 MED ORDER — PHENYLEPHRINE 80 MCG/ML (10ML) SYRINGE FOR IV PUSH (FOR BLOOD PRESSURE SUPPORT)
PREFILLED_SYRINGE | INTRAVENOUS | Status: AC
  Filled 2023-09-27: qty 10

## 2023-09-27 MED ORDER — PROPOFOL 10 MG/ML IV BOLUS
INTRAVENOUS | Status: AC
  Filled 2023-09-27: qty 20

## 2023-09-27 MED ORDER — METOPROLOL TARTRATE 5 MG/5ML IV SOLN: 2.5000 mg | INTRAVENOUS | Status: DC | PRN

## 2023-09-27 MED ORDER — INSULIN REGULAR(HUMAN) IN NACL 100-0.9 UT/100ML-% IV SOLN
INTRAVENOUS | Status: DC
Start: 2023-09-27 — End: 2023-09-29

## 2023-09-27 MED ORDER — PROPOFOL 1000 MG/100ML IV EMUL
INTRAVENOUS | Status: AC
Start: 2023-09-27 — End: ?
  Filled 2023-09-27: qty 100

## 2023-09-27 MED ORDER — MILRINONE LACTATE IN DEXTROSE 20-5 MG/100ML-% IV SOLN
0.2500 ug/kg/min | INTRAVENOUS | Status: DC
  Administered 2023-09-28 – 2023-09-29 (×3): 0.25 ug/kg/min via INTRAVENOUS
  Filled 2023-09-27 (×3): qty 100

## 2023-09-27 MED ORDER — ASPIRIN 81 MG PO CHEW
324.0000 mg | CHEWABLE_TABLET | Freq: Once | ORAL | Status: AC
  Administered 2023-09-27: 324 mg via ORAL
  Filled 2023-09-27: qty 4

## 2023-09-27 MED ORDER — HEMOSTATIC AGENTS (NO CHARGE) OPTIME
TOPICAL | Status: DC | PRN
  Administered 2023-09-27: 1 via TOPICAL

## 2023-09-27 SURGICAL SUPPLY — 78 items
ADAPTER CARDIO PERF ANTE/RETRO (ADAPTER) IMPLANT
ADAPTER MULTI PERFUSION 15 (ADAPTER) ×2 IMPLANT
BAG DECANTER FOR FLEXI CONT (MISCELLANEOUS) ×2 IMPLANT
BLADE CLIPPER SURG (BLADE) ×2 IMPLANT
BLADE STERNUM SYSTEM 6 (BLADE) ×2 IMPLANT
BLADE SURG 11 STRL SS (BLADE) IMPLANT
BNDG ELASTIC 4INX 5YD STR LF (GAUZE/BANDAGES/DRESSINGS) IMPLANT
BNDG ELASTIC 6INX 5YD STR LF (GAUZE/BANDAGES/DRESSINGS) ×2 IMPLANT
BNDG GAUZE DERMACEA FLUFF 4 (GAUZE/BANDAGES/DRESSINGS) ×2 IMPLANT
CANISTER SUCTION 3000ML PPV (SUCTIONS) ×2 IMPLANT
CANNULA AORTIC ROOT 9FR (CANNULA) ×2 IMPLANT
CANNULA EZ GLIDE AORTIC 21FR (CANNULA) ×2 IMPLANT
CANNULA GUNDRY RCSP 15FR (MISCELLANEOUS) IMPLANT
CANNULA MC2 2 STG 36/46 NON-V (CANNULA) IMPLANT
CANNULA VESSEL 3MM BLUNT TIP (CANNULA) ×6 IMPLANT
CATH ROBINSON RED A/P 18FR (CATHETERS) ×2 IMPLANT
CATH THORACIC 36FR (CATHETERS) ×2 IMPLANT
CATH THORACIC 36FR RT ANG (CATHETERS) ×2 IMPLANT
CLIP TI MEDIUM 24 (CLIP) IMPLANT
CLIP TI WIDE RED SMALL 24 (CLIP) IMPLANT
CONTAINER PROTECT SURGISLUSH (MISCELLANEOUS) ×4 IMPLANT
DERMABOND ADVANCED .7 DNX6 (GAUZE/BANDAGES/DRESSINGS) IMPLANT
DRAPE SRG 135X102X78XABS (DRAPES) ×2 IMPLANT
DRAPE WARM FLUID 44X44 (DRAPES) ×2 IMPLANT
DRSG COVADERM 4X14 (GAUZE/BANDAGES/DRESSINGS) ×2 IMPLANT
ELECTRODE REM PT RTRN 9FT ADLT (ELECTROSURGICAL) ×4 IMPLANT
FELT TEFLON 1X6 (MISCELLANEOUS) ×4 IMPLANT
GAUZE SPONGE 4X4 12PLY STRL (GAUZE/BANDAGES/DRESSINGS) ×4 IMPLANT
GLOVE BIO SURGEON STRL SZ 6 (GLOVE) IMPLANT
GLOVE SS BIOGEL STRL SZ 7.5 (GLOVE) ×2 IMPLANT
GOWN STRL REUS W/ TWL LRG LVL3 (GOWN DISPOSABLE) ×8 IMPLANT
GOWN STRL REUS W/ TWL XL LVL3 (GOWN DISPOSABLE) ×4 IMPLANT
HEMOSTAT POWDER SURGIFOAM 1G (HEMOSTASIS) ×6 IMPLANT
HEMOSTAT SURGICEL 2X14 (HEMOSTASIS) ×2 IMPLANT
INSERT FOGARTY XLG (MISCELLANEOUS) IMPLANT
KIT BASIN OR (CUSTOM PROCEDURE TRAY) ×2 IMPLANT
KIT SUCTION CATH 14FR (SUCTIONS) ×4 IMPLANT
KIT TURNOVER KIT B (KITS) ×2 IMPLANT
KIT VASOVIEW HEMOPRO 2 VH 4000 (KITS) ×2 IMPLANT
MARKER GRAFT CORONARY BYPASS (MISCELLANEOUS) ×6 IMPLANT
NS IRRIG 1000ML POUR BTL (IV SOLUTION) ×10 IMPLANT
PACK E OPEN HEART (SUTURE) ×2 IMPLANT
PACK OPEN HEART (CUSTOM PROCEDURE TRAY) ×2 IMPLANT
PAD ARMBOARD POSITIONER FOAM (MISCELLANEOUS) ×4 IMPLANT
PAD ELECT DEFIB RADIOL ZOLL (MISCELLANEOUS) ×2 IMPLANT
PENCIL BUTTON HOLSTER BLD 10FT (ELECTRODE) ×2 IMPLANT
POSITIONER HEAD DONUT 9IN (MISCELLANEOUS) ×2 IMPLANT
PUNCH AORTIC ROTATE 4.0MM (MISCELLANEOUS) IMPLANT
SET MPS 3-ND DEL (MISCELLANEOUS) IMPLANT
SPONGE T-LAP 18X18 ~~LOC~~+RFID (SPONGE) IMPLANT
STOPCOCK 4 WAY LG BORE MALE ST (IV SETS) IMPLANT
SUPPORT HEART JANKE-BARRON (MISCELLANEOUS) ×2 IMPLANT
SUT BONE WAX W31G (SUTURE) ×2 IMPLANT
SUT MNCRL AB 4-0 PS2 18 (SUTURE) IMPLANT
SUT PROLENE 3 0 SH DA (SUTURE) ×2 IMPLANT
SUT PROLENE 4 0 SH DA (SUTURE) IMPLANT
SUT PROLENE 4-0 RB1 .5 CRCL 36 (SUTURE) IMPLANT
SUT PROLENE 5 0 C 1 36 (SUTURE) IMPLANT
SUT PROLENE 6 0 C 1 30 (SUTURE) ×4 IMPLANT
SUT PROLENE 7 0 BV1 MDA (SUTURE) ×2 IMPLANT
SUT PROLENE 8 0 BV175 6 (SUTURE) IMPLANT
SUT STEEL 6MS V (SUTURE) ×2 IMPLANT
SUT STEEL STERNAL CCS#1 18IN (SUTURE) IMPLANT
SUT STEEL SZ 6 DBL 3X14 BALL (SUTURE) ×2 IMPLANT
SUT VIC AB 1 CTX36XBRD ANBCTR (SUTURE) ×4 IMPLANT
SUT VIC AB 2-0 CT1 TAPERPNT 27 (SUTURE) IMPLANT
SUT VIC AB 3-0 SH 27X BRD (SUTURE) IMPLANT
SYSTEM SAHARA CHEST DRAIN ATS (WOUND CARE) ×2 IMPLANT
TAPE CLOTH SURG 4X10 WHT LF (GAUZE/BANDAGES/DRESSINGS) IMPLANT
TAPE PAPER 2X10 WHT MICROPORE (GAUZE/BANDAGES/DRESSINGS) IMPLANT
TOWEL GREEN STERILE (TOWEL DISPOSABLE) ×2 IMPLANT
TOWEL GREEN STERILE FF (TOWEL DISPOSABLE) ×2 IMPLANT
TRAY FOLEY SLVR 16FR TEMP STAT (SET/KITS/TRAYS/PACK) ×2 IMPLANT
TUBE SUCT INTRACARD DLP 20F (MISCELLANEOUS) ×2 IMPLANT
TUBE SUCTION CARDIAC 10FR (CANNULA) ×2 IMPLANT
TUBING LAP HI FLOW INSUFFLATIO (TUBING) ×2 IMPLANT
UNDERPAD 30X36 HEAVY ABSORB (UNDERPADS AND DIAPERS) ×2 IMPLANT
WATER STERILE IRR 1000ML POUR (IV SOLUTION) ×4 IMPLANT

## 2023-09-27 NOTE — Consult Note (Addendum)
 Advanced Heart Failure Team Consult Note   Primary Physician: Clem Currier, DO Cardiologist:  Hazle Lites, MD  Reason for Consultation: Post cardiotomy shock  HPI:    [name] is seen today for evaluation of post cardiotomy shock at the request of Dr. [name] with [facility].   73 y.o. 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   He was admitted 09/21/23 with epigastric chest pain radiating to chest and neck. HS troponin elevated to 673. BP markedly elevated. Echo EF 35-40%, RV not well visualized. Cardiac cath with severe 3 v CAD. LVEDP 38-40 mmHg. He was diuresed with IV lasix  and started on GDMT. Scr rose to 2.2 but trended down to 1.9 today.  He underwent CABG X 4 (LIMA to LAD, SVG to OM1, sequential to OM3, SVG to PDA) by Dr. [name] today. There was significant difficulty separating from cardiopulmonary bypass (took 3 attempts). Transferred to the ICU on inotrope/pressors. On and off NE with alternating hypo and hypertension.   Home Medications Prior to Admission medications   Medication Sig Start Date End Date Taking? Authorizing Provider  aspirin  EC 81 MG tablet Take 81 mg by mouth daily.   Yes [provider]  carvedilol  (COREG ) 25 MG tablet Take 1 tablet (25 mg total) by mouth 2 (two) times daily. Patient taking differently: Take 25 mg by mouth daily. 12/06/22 09/20/24 Yes McDiarmid, Demetra Filter, MD  eplerenone  (INSPRA ) 25 MG tablet Take 1 tablet (25 mg total) by mouth daily. 06/20/23  Yes McDiarmid, Demetra Filter, MD  insulin  aspart (FIASP  FLEXTOUCH) 100 UNIT/ML FlexTouch Pen Inject 40 Units into the skin daily before supper. 07/25/23  Yes McDiarmid, Demetra Filter, MD  Insulin  Glargine (BASAGLAR  KWIKPEN) 100 UNIT/ML DIAL AND INJECT 65 UNITS UNDER THE SKIN DAILY. 08/21/23  Yes Clem Currier, DO  metFORMIN  (GLUCOPHAGE ) 500 MG tablet Take 1 tablet (500 mg total) by mouth 2 (two) times daily  with a meal. 12/22/22  Yes McDiarmid, Demetra Filter, MD  nitroGLYCERIN  (NITROSTAT ) 0.3 MG SL tablet Place 1 tablet (0.3 mg total) under the tongue every 5 (five) minutes as needed for chest pain. 08/17/23  Yes Clem Currier, DO  rosuvastatin  (CRESTOR ) 40 MG tablet Take 1 tablet (40 mg total) by mouth daily. 03/14/23  Yes McDiarmid, Demetra Filter, MD  Accu-Chek Softclix Lancets lancets USE AS DIRECTED UP TO FOUR TIMES DAILY Patient not taking: Reported on 09/05/2023 06/10/19   Shirley, [place], DO  BD PEN NEEDLE NANO 2ND GEN 32G X 4 MM MISC USE 3 TIMES DAILY AS NEEDED BETWEEN MEALS AND BEDTIME 01/25/23   Clem Currier, DO  blood glucose meter kit and supplies Dispense based on patient and insurance preference. Use up to four times daily as directed. (FOR ICD-10 E10.9, E11.9). Patient not taking: Reported on 09/05/2023 05/13/20   Lilland, Alana, DO  Continuous Glucose Sensor (FREESTYLE LIBRE 3 SENSOR) MISC 2 Devices by Does not apply route as needed. Place 1 sensor on the skin every 14 days. Use to check glucose continuously 12/22/22   McDiarmid, Demetra Filter, MD  glucose blood (ACCU-CHEK GUIDE) test strip Use as instructed Patient not taking: Reported on 09/05/2023 05/21/20   Anibal Kent, MD    Past Medical History: Past Medical History:  Diagnosis Date   CAD (coronary artery disease)    Diabetes mellitus without complication (HCC)    Hypercholesterolemia    Hypertension  MI (myocardial infarction) [facility])    Renal insufficiency     Past Surgical History: Past Surgical History:  Procedure Laterality Date   CORONARY ANGIOPLASTY WITH STENT PLACEMENT  08/01/2003   Proximal LAD   LEFT HEART CATH AND CORONARY ANGIOGRAPHY N/A 09/21/2023   Procedure: LEFT HEART CATH AND CORONARY ANGIOGRAPHY;  Surgeon: Kyra Phy, MD;  Location: [facility] INVASIVE CV LAB;  Service: Cardiovascular;  Laterality: N/A;    Family History: Family History  Problem Relation Age of Onset   Heart disease Mother    Diabetes Mother     Hypertension Mother    Alcoholism Father    Heart disease Father    Hypertension Sister    Diabetes Brother    Hypercholesterolemia Brother    Osteoporosis Brother    Hypertension Brother    Kidney disease Brother     Social History: Social History   Socioeconomic History   Marital status: Married    Spouse name: Bartholomew Light   Number of children: 4   Years of education: 10   Highest education level: 10th grade  Occupational History   Occupation: retired  Tobacco Use   Smoking status: Former    Current packs/day: 0.00    Average packs/day: 0.5 packs/day for 10.0 years (5.0 ttl pk-yrs)    Types: Cigarettes    Start date: 04/26/1983    Quit date: 04/25/1993    Years since quitting: 30.4   Smokeless tobacco: Never  Vaping Use   Vaping status: Never Used  Substance and Sexual Activity   Alcohol use: No   Drug use: No   Sexual activity: Yes  Other Topics Concern   Not on file  Social History Narrative   Patient lives with his wife in [place].   Patent has 4 boys, one lives in [place] .    Patient enjoys fishing, camping in the woods, working outside, anything outdoor related.   Social Drivers of Corporate investment banker Strain: Low Risk  (08/21/2023)   Overall Financial Resource Strain (CARDIA)    Difficulty of Paying Living Expenses: Not very hard  Food Insecurity: No Food Insecurity (09/22/2023)   Hunger Vital Sign    Worried About Running Out of Food in the Last Year: Never true    Ran Out of Food in the Last Year: Never true  Transportation Needs: No Transportation Needs (09/22/2023)   PRAPARE - Administrator, Civil Service (Medical): No    Lack of Transportation (Non-Medical): No  Physical Activity: Inactive (08/21/2023)   Exercise Vital Sign    Days of Exercise per Week: 0 days    Minutes of Exercise per Session: 0 min  Stress: No Stress Concern Present (08/21/2023)   [employer] of Occupational Health - Occupational Stress Questionnaire     Feeling of Stress : Not at all  Social Connections: Socially Integrated (09/22/2023)   Social Connection and Isolation Panel [NHANES]    Frequency of Communication with Friends and Family: More than three times a week    Frequency of Social Gatherings with Friends and Family: More than three times a week    Attends Religious Services: More than 4 times per year    Active Member of [employer] or Organizations: Yes    Attends Banker Meetings: Never    Marital Status: Married    Allergies:  Allergies  Allergen Reactions   Trulicity [Dulaglutide] Other (See Comments)    Patient indicated this caused pancreatitis.     Objective:  Vital Signs:   Temp:  [97.8 F (36.6 C)-98.3 F (36.8 C)] 97.9 F (36.6 C) (06/04 0725) Pulse Rate:  [63-77] 71 (06/04 0813) Resp:  [10-21] 11 (06/04 0809) BP: (118-154)/(77-83) 154/80 (06/04 0334) SpO2:  [94 %-100 %] 99 % (06/04 0813) FiO2 (%):  [50 %] 50 % (06/04 1551) Weight:  [93.1 kg] 93.1 kg (06/04 0603) Last BM Date : 09/25/23  Weight change: Filed Weights   09/21/23 0754 09/27/23 0603  Weight: 97.1 kg 93.1 kg    Intake/Output:   Intake/Output Summary (Last 24 hours) at 09/27/2023 1615 Last data filed at 09/27/2023 1507 Gross per 24 hour  Intake 3070 ml  Output 1900 ml  Net 1170 ml      Physical Exam    General:  Critically ill appearing HEENT: + ETT Neck: JVP 10-12, R internal jugular Swan Cor: Regular rate & rhythm. No rubs, gallops or murmurs. Lungs: clear anteriorly Abdomen: obese, soft, nondistended.  Extremities: no edema Neuro: Sedated on vent   Telemetry   SR 80s Labs   Basic Metabolic Panel: Recent Labs  Lab 09/22/23 0213 09/23/23 0353 09/24/23 0357 09/25/23 0420 09/26/23 0841 09/27/23 0457 09/27/23 0901 09/27/23 1238 09/27/23 1315 09/27/23 1347 09/27/23 1424 09/27/23 1459 09/27/23 1504  NA 137 139 140 140 140 140   < > 139 140 141 141 140 140  K 3.8 3.3* 3.6 4.5 4.3 4.2   < > 5.5* 5.5* 5.7*  5.2* 4.8 4.8  CL 103 103 104 108 106 106   < > 102 103 107 106  --  107  CO2 25 25 26 23 25 26   --   --   --   --   --   --   --   GLUCOSE 286* 173* 144* 166* 221* 102*   < > 79 93 108* 148*  --  144*  BUN 25* 30* 37* 31* 25* 24*   < > 20 19 21 21   --  20  CREATININE 1.66* 1.98* 2.21* 2.04* 1.85* 1.89*   < > 1.70* 1.70* 1.70* 1.60*  --  1.70*  CALCIUM  8.8* 8.4* 8.9 8.4* 8.9 9.1  --   --   --   --   --   --   --   MG 2.1 2.3 2.4 2.4  --  2.1  --   --   --   --   --   --   --    < > = values in this interval not displayed.    Liver Function Tests: Recent Labs  Lab 09/21/23 0757  AST 15  ALT 15  ALKPHOS 64  BILITOT 0.6  PROT 6.8  ALBUMIN 2.9*   Recent Labs  Lab 09/21/23 0814  LIPASE 45   No results for input(s): "AMMONIA" in the last 168 hours.  CBC: Recent Labs  Lab 09/23/23 0353 09/24/23 0357 09/25/23 0420 09/26/23 0351 09/27/23 0457 09/27/23 0901 09/27/23 1234 09/27/23 1238 09/27/23 1315 09/27/23 1347 09/27/23 1424 09/27/23 1459 09/27/23 1504  WBC 8.7 7.8 7.5 7.1 8.1  --   --   --   --   --   --   --   --   HGB 12.5* 12.9* 12.1* 11.9* 12.2*   < > 6.8*   < > 8.2* 6.8* 6.8* 11.6* 11.2*  HCT 38.8* 39.9 38.5* 37.9* 38.5*   < > 21.4*   < > 24.0* 20.0* 20.0* 34.0* 33.0*  MCV 88.2 89.3 91.7 90.2 90.4  --   --   --   --   --   --   --   --  PLT 241 283 244 270 280  --  148*  --   --   --   --   --   --    < > = values in this interval not displayed.    Cardiac Enzymes: No results for input(s): "CKTOTAL", "CKMB", "CKMBINDEX", "TROPONINI" in the last 168 hours.  BNP: BNP (last 3 results) Recent Labs    09/21/23 0806  BNP 128.5*    ProBNP (last 3 results) No results for input(s): "PROBNP" in the last 8760 hours.   CBG: Recent Labs  Lab 09/26/23 1137 09/26/23 1702 09/26/23 2132 09/27/23 0634 09/27/23 1607  GLUCAP 139* 154* 121* 97 134*    Coagulation Studies: No results for input(s): "LABPROT", "INR" in the last 72 hours.   Imaging   No  results found.   Medications:     Current Medications:  [START ON 09/28/2023] acetaminophen   1,000 mg Oral Q6H   Or   [START ON 09/28/2023] acetaminophen  (TYLENOL ) oral liquid 160 mg/5 mL  1,000 mg Per Tube Q6H   [START ON 09/28/2023] aspirin  EC  325 mg Oral Daily   Or   [START ON 09/28/2023] aspirin   324 mg Per Tube Daily   atorvastatin  80 mg Oral Daily   [START ON 09/28/2023] bisacodyl  10 mg Oral Daily   Or   [START ON 09/28/2023] bisacodyl  10 mg Rectal Daily   [START ON 09/28/2023] docusate sodium  200 mg Oral Daily   metoCLOPramide (REGLAN) injection  10 mg Intravenous Q6H   metoprolol tartrate  12.5 mg Oral BID   Or   metoprolol tartrate  12.5 mg Per Tube BID   [START ON 09/29/2023] pantoprazole   40 mg Oral Daily   pantoprazole  (PROTONIX ) IV  40 mg Intravenous QHS   [START ON 09/28/2023] sodium chloride  flush  3 mL Intravenous Q12H    Infusions:  sodium chloride      [START ON 09/28/2023] sodium chloride      sodium chloride      albumin human      ceFAZolin  (ANCEF ) IV     dexmedetomidine  (PRECEDEX ) IV infusion 0.7 mcg/kg/hr (09/27/23 1545)   epinephrine  4 mcg/min (09/27/23 1545)   insulin      lactated ringers     lactated ringers     magnesium  sulfate 4 g (09/27/23 1611)   milrinone  0.125 mcg/kg/min (09/27/23 1545)   norepinephrine  (LEVOPHED ) Adult infusion 0 mcg/min (09/27/23 1545)   phenylephrine  (NEO-SYNEPHRINE) Adult infusion     potassium chloride      propofol (DIPRIVAN) infusion     vancomycin         Patient Profile   73y.o. male with history of CAD, HFmrEF, uncontrolled DM II, HTN, HLD.  Admitted with NSTEMI and acute on chronic CHF. Underwent CABG X 4 on 09/27/23 c/b postcardiotomy shock.  Assessment/Plan   Postcardiotomy shock -Echo preCABG: EF 35-40%, RV difficult -LVEDP 38-40 during LHC on 5/29 -Difficulty separating from cardiopulmonary bypass following CABG 06/04 -Currently on 4 Epi (for RV) and 0.25 milrinone . On and off NE. CI 1.58 by TD, PA 26/13, CVP  11, PAPi 1.2, SVR 1316 -Appears dry. Hopefully CI with getting volume back. Currently receiving albumin. -Has been started on nicardipine gtt d/t hypertension shortly after arriving to ICU with MAP to 110s  2. NSTEMI/CAD -Hx PCI to LAD in 2005 -NSTEMI 09/21/23. MV CAD on cath. -S/p CABG X 4 (LIMA to LAD, SVG to OM1, sequential to OM3, SVG to PDA) on 09/27/23 -Continue aspirin , eventual plavix -Continue Atorvastatin 80. LDL  121 (not adherent with home meds prior to admit)  3. AKI on CKD IIIa -Baseline Scr around 1.5 -Scr peaked at 2.2, Scr 1.9 this am -Support CO  4. ABLA - 1 u RBCs in OR  Length of Stay: 6  FINCH, LINDSAY N, PA-C  09/27/2023, 4:15 PM    Advanced Heart Failure Team Pager [phone] (M-F; 7a - 5p)  Please contact [facility] for night-coverage after hours (4p -7a ) and weekends on [email or web address]   Patient seen with PA, I formulated the plan and agree with the above note.   Patient seen post-CABG.  Difficult time coming off bypass.  Currently on epinephrine  4, milrinone  0.25.  Nicardipine added for elevated MAP.  Patient has had boluses of albumin this evening.   Thermo CI up to 2.05 from 1.58. Patient is in NSR.   General: Intubated and sedated.  Neck: No JVD, no thyromegaly or thyroid  nodule.  Lungs: Clear to auscultation bilaterally with normal respiratory effort. CV: Nondisplaced PMI.  Heart regular S1/S2, no S3/S4, no murmur.  No peripheral edema.   Abdomen: Soft, nontender, no hepatosplenomegaly, no distention.  Skin: Intact without lesions or rashes.  Neurologic: Sedated on vent Extremities: No clubbing or cyanosis.  HEENT: Normal.   Continue current inotropes (epinephrine , milrinone ) with improving CO.  Titrate nicardipine as needed to avoid marked hypertension.  Follow creatinine closely with AKI.  Will need eventual diuresis.   Peder Bourdon 09/27/2023 7:25 PM

## 2023-09-27 NOTE — Anesthesia Procedure Notes (Signed)
 Central Venous Catheter Insertion Performed by: Micheal Agent, DO, anesthesiologist Start/End6/07/2023 7:50 AM, 09/27/2023 8:05 AM Patient location: Pre-op. Preanesthetic checklist: patient identified, IV checked, site marked, risks and benefits discussed, surgical consent, monitors and equipment checked, pre-op evaluation, timeout performed and anesthesia consent Position: Trendelenburg Lidocaine  1% used for infiltration and patient sedated Hand hygiene performed  and maximum sterile barriers used  Catheter size: 9 Fr Central line was placed.MAC introducer Procedure performed using ultrasound guided technique. Ultrasound Notes:anatomy identified, needle tip was noted to be adjacent to the nerve/plexus identified, no ultrasound evidence of intravascular and/or intraneural injection and image(s) printed for medical record Attempts: 1 Following insertion, line sutured and dressing applied. Post procedure assessment: blood return through all ports, free fluid flow and no air  Patient tolerated the procedure well with no immediate complications.

## 2023-09-27 NOTE — Anesthesia Procedure Notes (Signed)
 Procedure Name: Intubation Date/Time: 09/27/2023 8:55 AM  Performed by: Alinda Apley, CRNAPre-anesthesia Checklist: Patient identified, Emergency Drugs available, Suction available and Patient being monitored Patient Re-evaluated:Patient Re-evaluated prior to induction Oxygen Delivery Method: Circle System Utilized Preoxygenation: Pre-oxygenation with 100% oxygen Induction Type: IV induction Ventilation: Mask ventilation with difficulty, Two handed mask ventilation required and Oral airway inserted - appropriate to patient size Laryngoscope Size: 3 and Miller Grade View: Grade I Tube type: Oral Tube size: 8.0 mm Number of attempts: 1 Airway Equipment and Method: Stylet and Oral airway Placement Confirmation: ETT inserted through vocal cords under direct vision, positive ETCO2 and breath sounds checked- equal and bilateral Secured at: 23 cm Tube secured with: Tape Dental Injury: Teeth and Oropharynx as per pre-operative assessment

## 2023-09-27 NOTE — Anesthesia Procedure Notes (Signed)
 Arterial Line Insertion Start/End6/07/2023 7:55 AM Performed by: CRNA  Preanesthetic checklist: patient identified, IV checked, site marked, risks and benefits discussed, surgical consent, monitors and equipment checked, pre-op evaluation, timeout performed and anesthesia consent Lidocaine  1% used for infiltration Left, radial was placed Catheter size: 20 G Hand hygiene performed  and maximum sterile barriers used   Attempts: 1 Procedure performed using ultrasound guided technique. Ultrasound Notes:anatomy identified, needle tip was noted to be adjacent to the nerve/plexus identified and no ultrasound evidence of intravascular and/or intraneural injection Following insertion, dressing applied and Biopatch. Post procedure assessment: unchanged  Patient tolerated the procedure well with no immediate complications. Additional procedure comments: SRNA Ladena Picking with CRNA Eulalio Hibbs.

## 2023-09-27 NOTE — Brief Op Note (Signed)
 09/21/2023 - 09/27/2023  4:59 PM  PATIENT:  [name]  73 y.o. male  PRE-OPERATIVE DIAGNOSIS:  CORONARY ARTERY DISEASE  POST-OPERATIVE DIAGNOSIS:  CORONARY ARTERY DISEASE  PROCEDURE:  Procedure(s): CORONARY ARTERY BYPASS GRAFTING (CABG) TIMES FOUR UTILIZING THE LEFT INTERNAL MAMMARY ARTERY AND ENDOSCOPIC VEIN HARVEST RIGHT GREATER SAPHENOUS VEIN (N/A) ECHOCARDIOGRAM, TRANSESOPHAGEAL, INTRAOPERATIVE (N/A) Vein harvest time: Vein prep time:  -LIMA to LAD -Sequential  SVG to OM2 and OM3 -SVG to PDA  SURGEON:  Surgeons and Role:    * [name], MD - Primary  PHYSICIAN ASSISTANT:  Debroah Fanning PA-C, Eligha Grumbles PA-S  ASSISTANTS: Nova Began RNFA   ANESTHESIA:   general  EBL:  750 ml  BLOOD ADMINISTERED:1 unit CC PRBC, 400 ml cell saver  DRAINS: Mediastinal drains   LOCAL MEDICATIONS USED:  NONE  SPECIMEN:  No Specimen  DISPOSITION OF SPECIMEN:  N/A  COUNTS:  YES  TOURNIQUET:  * No tourniquets in log *  DICTATION: .Dragon Dictation  PLAN OF CARE: Admit to inpatient   PATIENT DISPOSITION:  ICU - intubated and hemodynamically stable.   Delay start of Pharmacological VTE agent (>24hrs) due to surgical blood loss or risk of bleeding: yes  Diffusely disease poor quality target vessels Off CPB on 3rd attempt with milrinone  0.3, epi 4, norepi 10

## 2023-09-27 NOTE — Plan of Care (Signed)
  Problem: Education: Goal: Individualized Educational Video(s) Outcome: Progressing   Problem: Coping: Goal: Ability to adjust to condition or change in health will improve Outcome: Progressing   Problem: Fluid Volume: Goal: Ability to maintain a balanced intake and output will improve Outcome: Progressing

## 2023-09-27 NOTE — Discharge Instructions (Signed)

## 2023-09-27 NOTE — Assessment & Plan Note (Signed)
 Stable to mildly elevated. Continue management per specialists.  - Continue carvedilol  - Discontinued amlodipine  and irbesartan   - Hold hydrochlorothiazide  due to CKD

## 2023-09-27 NOTE — Progress Notes (Signed)
 Checklist and pre-operative preparation was done by the night RN, this RN transferred the patient to the OR at 0720 for scheduled surgical procedure.   His belongings were given to his family members.

## 2023-09-27 NOTE — Assessment & Plan Note (Signed)
 CABG today, transferred to CVICU - Cardiology following -  CTS following - Continue nitro drip and heparin  drip - A.m. BMP, Mag, CBC - Replete K as needed - Will need low-dose spironolactone  at discharge, hold on starting SGLT2 inhibitor at discharge due to HgbA1c > 15.

## 2023-09-27 NOTE — Interval H&P Note (Signed)
 History and Physical Interval Note: Creatinine 1.9 09/27/2023 8:19 AM  [name]  has presented today for surgery, with the diagnosis of CAD.  The various methods of treatment have been discussed with the patient and family. After consideration of risks, benefits and other options for treatment, the patient has consented to  Procedure(s): CORONARY ARTERY BYPASS GRAFTING (CABG) (N/A) ECHOCARDIOGRAM, TRANSESOPHAGEAL, INTRAOPERATIVE (N/A) as a surgical intervention.  The patient's history has been reviewed, patient examined, no change in status, stable for surgery.  I have reviewed the patient's chart and labs.  Questions were answered to the patient's satisfaction.     [name]

## 2023-09-27 NOTE — Anesthesia Preprocedure Evaluation (Addendum)
 Anesthesia Evaluation  Patient identified by MRN, date of birth, ID band Patient awake    Reviewed: Allergy & Precautions, NPO status , Patient's Chart, lab work & pertinent test results  Airway Mallampati: II  TM Distance: >3 FB Neck ROM: Full    Dental no notable dental hx. (+) Edentulous Upper, Edentulous Lower   Pulmonary former smoker   Pulmonary exam normal        Cardiovascular hypertension, Pt. on medications and Pt. on home beta blockers + CAD and + Past MI   Rhythm:Regular Rate:Normal  ECHO:  1. Left ventricular ejection fraction, by estimation, is 35 to 40%. The  left ventricle has moderately decreased function. The left ventricle  demonstrates global hypokinesis. Left ventricular diastolic function could  not be evaluated.   2. Right ventricular systolic function was not well visualized. The right  ventricular size is not well visualized.   3. The mitral valve is normal in structure. No evidence of mitral valve  regurgitation. No evidence of mitral stenosis.   4. The aortic valve is tricuspid. Aortic valve regurgitation is not  visualized. Aortic valve sclerosis/calcification is present, without any  evidence of aortic stenosis. Aortic valve area, by VTI measures 2.06 cm.  Aortic valve mean gradient measures  2.0 mmHg. Aortic valve Vmax measures 0.98 m/s.   5. The inferior vena cava is normal in size with greater than 50%  respiratory variability, suggesting right atrial pressure of 3 mmHg.     Neuro/Psych negative neurological ROS  negative psych ROS   GI/Hepatic negative GI ROS, Neg liver ROS,,,  Endo/Other  diabetes, Type 2, Oral Hypoglycemic Agents, Insulin  Dependent    Renal/GU   negative genitourinary   Musculoskeletal negative musculoskeletal ROS (+)    Abdominal Normal abdominal exam  (+)   Peds  Hematology Lab Results      Component                Value               Date                       WBC                      8.1                 09/27/2023                HGB                      12.2 (L)            09/27/2023                HCT                      38.5 (L)            09/27/2023                MCV                      90.4                09/27/2023                PLT  280                 09/27/2023             Lab Results      Component                Value               Date                      NA                       140                 09/27/2023                K                        4.2                 09/27/2023                CO2                      26                  09/27/2023                GLUCOSE                  102 (H)             09/27/2023                BUN                      24 (H)              09/27/2023                CREATININE               1.89 (H)            09/27/2023                CALCIUM                   9.1                 09/27/2023                EGFR                     43 (L)              07/24/2023                GFRNONAA                 37 (L)              09/27/2023              Anesthesia Other Findings   Reproductive/Obstetrics                             Anesthesia Physical Anesthesia Plan  ASA: 4  Anesthesia Plan: General  Post-op Pain Management:    Induction: Intravenous  PONV Risk Score and Plan: 2 and Midazolam  and Treatment may vary due to age or medical condition  Airway Management Planned: Mask and Oral ETT  Additional Equipment: Arterial line, CVP, PA Cath, TEE, 3D TEE and Ultrasound Guidance Line Placement  Intra-op Plan:   Post-operative Plan: Post-operative intubation/ventilation  Informed Consent: I have reviewed the patients History and Physical, chart, labs and discussed the procedure including the risks, benefits and alternatives for the proposed anesthesia with the patient or authorized representative who has indicated his/her understanding and  acceptance.     Dental advisory given  Plan Discussed with: CRNA  Anesthesia Plan Comments:        Anesthesia Quick Evaluation

## 2023-09-27 NOTE — Anesthesia Procedure Notes (Signed)
 Central Venous Catheter Insertion Performed by: Micheal Agent, DO, anesthesiologist Start/End6/07/2023 8:04 AM, 09/27/2023 8:06 AM Patient location: Pre-op. Preanesthetic checklist: patient identified, IV checked, site marked, risks and benefits discussed, surgical consent, monitors and equipment checked, pre-op evaluation, timeout performed and anesthesia consent Position: supine Hand hygiene performed  and maximum sterile barriers used  PA cath was placed.Swan type:thermodilution PA Cath depth:50 Procedure performed without using ultrasound guided technique. Attempts: 1 Patient tolerated the procedure well with no immediate complications.

## 2023-09-27 NOTE — Progress Notes (Signed)
     Daily Progress Note Intern Pager: [phone]  Patient name: [name] Medical record number: [id number] Date of birth: 1950/06/12 Age: 73 y.o. Gender: male  Primary Care Provider: Clem Currier, DO Consultants: CT surg, cardiology Code Status: none  Pt Overview and Major Events to Date:  5/29: Admitted, cardiac cath completed  Assessment and Plan:  This is a 73 year old male patient presenting for NSTEMI now status post cardiac cath with significant stenosis in all vessels.  Patient will undergo CABG today and transfer to cardiovascular ICU for recovery. Assessment & Plan NSTEMI (non-ST elevated myocardial infarction) (HCC) CABG today, transferred to CVICU - Cardiology following -  CTS following - Continue nitro drip and heparin  drip - A.m. BMP, Mag, CBC - Replete K as needed - Will need low-dose spironolactone  at discharge, hold on starting SGLT2 inhibitor at discharge due to HgbA1c > 15.  Essential hypertension, benign Stable to mildly elevated. Continue management per specialists.  - Continue carvedilol  - Discontinued amlodipine  and irbesartan   - Hold hydrochlorothiazide  due to CKD Pulmonary nodule Possible 8 mm right lung nodule on CXR at admission. Repeat CXR showed no definitive nodule.   Chronic health problem T2DM: A1c> 15, not compliant with home regimen, on 40 units LAI daily. Added 5 u SAI TID WC. Continue SSI moderate. Fasting sugars now 160 or lower, CTM CKDIII: Creatinine up trending 1.98 > 2.21 > 2.04, avoid nephrotoxic meds.   FEN/GI: N.p.o. PPx: Heparin  Dispo:Transfer to CVICU post CABG plan per cardiology and CT surgery.   Subjective:  Patient to surgery prior to rounds this a.m. chart reviewed.  Objective: Temp:  [97.8 F (36.6 C)-98.7 F (37.1 C)] 97.9 F (36.6 C) (06/04 0725) Pulse Rate:  [63-78] 77 (06/04 0725) Resp:  [10-20] 16 (06/04 0725) BP: (118-160)/(76-84) 154/80 (06/04 0334) SpO2:  [94 %-99 %] 94 % (06/04 0725) Weight:  [93.1 kg]  93.1 kg (06/04 0603) Physical Exam: Unable to perform physical exam prior to surgery.  Patient transferred to CVICU and no longer under care of this team.  Laboratory: Most recent CBC Lab Results  Component Value Date   WBC 8.1 09/27/2023   HGB 12.2 (L) 09/27/2023   HCT 38.5 (L) 09/27/2023   MCV 90.4 09/27/2023   PLT 280 09/27/2023   Most recent BMP    Latest Ref Rng & Units 09/27/2023    4:57 AM  BMP  Glucose 70 - 99 mg/dL 147   BUN 8 - 23 mg/dL 24   Creatinine 8.29 - 1.24 mg/dL 5.62   Sodium 130 - 865 mmol/L 140   Potassium 3.5 - 5.1 mmol/L 4.2   Chloride 98 - 111 mmol/L 106   CO2 22 - 32 mmol/L 26   Calcium  8.9 - 10.3 mg/dL 9.1     Rayma Calandra, DO 09/27/2023, 7:34 AM  PGY-1, [facility] FPTS Intern pager: [phone], text pages welcome Secure chat group [facility] Teaching Service

## 2023-09-27 NOTE — Assessment & Plan Note (Signed)
 Possible 8 mm right lung nodule on CXR at admission. Repeat CXR showed no definitive nodule.

## 2023-09-27 NOTE — Assessment & Plan Note (Signed)
 T2DM: A1c> 15, not compliant with home regimen, on 40 units LAI daily. Added 5 u SAI TID WC. Continue SSI moderate. Fasting sugars now 160 or lower, CTM CKDIII: Creatinine up trending 1.98 > 2.21 > 2.04, avoid nephrotoxic meds.

## 2023-09-27 NOTE — Transfer of Care (Signed)
 Immediate Anesthesia Transfer of Care Note  Patient: [name]  Procedure(s) Performed: CORONARY ARTERY BYPASS GRAFTING (CABG) TIMES FOUR UTILIZING THE LEFT INTERNAL MAMMARY ARTERY AND ENDOSCOPIC VEIN HARVEST RIGHT GREATER SAPHENOUS VEIN (Chest) ECHOCARDIOGRAM, TRANSESOPHAGEAL, INTRAOPERATIVE  Patient Location: ICU  Anesthesia Type:General  Level of Consciousness: sedated and Patient remains intubated per anesthesia plan  Airway & Oxygen Therapy: Patient remains intubated per anesthesia plan and Patient placed on Ventilator (see vital sign flow sheet for setting)  Post-op Assessment: Report given to RN and Post -op Vital signs reviewed and stable  Post vital signs: Reviewed and stable  Last Vitals:  Vitals Value Taken Time  BP 86/63 09/27/23 1552  Temp 36.7  1538  Pulse 83 09/27/23 1556  Resp 15 09/27/23 1556  SpO2 100 % 09/27/23 1556  Vitals shown include unfiled device data.  Last Pain:  Vitals:   09/27/23 0725  TempSrc: Oral  PainSc: 0-No pain         Complications: No notable events documented.

## 2023-09-28 ENCOUNTER — Inpatient Hospital Stay (HOSPITAL_COMMUNITY)

## 2023-09-28 ENCOUNTER — Encounter (HOSPITAL_COMMUNITY): Payer: Self-pay | Admitting: Thoracic Surgery (Cardiothoracic Vascular Surgery)

## 2023-09-28 DIAGNOSIS — J90 Pleural effusion, not elsewhere classified: Secondary | ICD-10-CM | POA: Diagnosis not present

## 2023-09-28 DIAGNOSIS — R579 Shock, unspecified: Secondary | ICD-10-CM | POA: Diagnosis not present

## 2023-09-28 DIAGNOSIS — J9811 Atelectasis: Secondary | ICD-10-CM | POA: Diagnosis not present

## 2023-09-28 DIAGNOSIS — I517 Cardiomegaly: Secondary | ICD-10-CM | POA: Diagnosis not present

## 2023-09-28 DIAGNOSIS — Z4682 Encounter for fitting and adjustment of non-vascular catheter: Secondary | ICD-10-CM | POA: Diagnosis not present

## 2023-09-28 DIAGNOSIS — N179 Acute kidney failure, unspecified: Secondary | ICD-10-CM | POA: Diagnosis not present

## 2023-09-28 DIAGNOSIS — I5043 Acute on chronic combined systolic (congestive) and diastolic (congestive) heart failure: Secondary | ICD-10-CM | POA: Diagnosis not present

## 2023-09-28 DIAGNOSIS — I214 Non-ST elevation (NSTEMI) myocardial infarction: Secondary | ICD-10-CM | POA: Diagnosis not present

## 2023-09-28 LAB — GLUCOSE, CAPILLARY
Glucose-Capillary: 122 mg/dL — ABNORMAL HIGH (ref 70–99)
Glucose-Capillary: 131 mg/dL — ABNORMAL HIGH (ref 70–99)
Glucose-Capillary: 138 mg/dL — ABNORMAL HIGH (ref 70–99)
Glucose-Capillary: 143 mg/dL — ABNORMAL HIGH (ref 70–99)
Glucose-Capillary: 144 mg/dL — ABNORMAL HIGH (ref 70–99)
Glucose-Capillary: 146 mg/dL — ABNORMAL HIGH (ref 70–99)
Glucose-Capillary: 147 mg/dL — ABNORMAL HIGH (ref 70–99)
Glucose-Capillary: 148 mg/dL — ABNORMAL HIGH (ref 70–99)
Glucose-Capillary: 149 mg/dL — ABNORMAL HIGH (ref 70–99)
Glucose-Capillary: 165 mg/dL — ABNORMAL HIGH (ref 70–99)
Glucose-Capillary: 167 mg/dL — ABNORMAL HIGH (ref 70–99)
Glucose-Capillary: 174 mg/dL — ABNORMAL HIGH (ref 70–99)
Glucose-Capillary: 180 mg/dL — ABNORMAL HIGH (ref 70–99)

## 2023-09-28 LAB — BASIC METABOLIC PANEL WITH GFR
Anion gap: 9 (ref 5–15)
Anion gap: 16 — ABNORMAL HIGH (ref 5–15)
BUN: 20 mg/dL (ref 8–23)
BUN: 24 mg/dL — ABNORMAL HIGH (ref 8–23)
CO2: 22 mmol/L (ref 22–32)
CO2: 21 mmol/L — ABNORMAL LOW (ref 22–32)
Calcium: 7.9 mg/dL — ABNORMAL LOW (ref 8.9–10.3)
Calcium: 8.1 mg/dL — ABNORMAL LOW (ref 8.9–10.3)
Chloride: 109 mmol/L (ref 98–111)
Chloride: 103 mmol/L (ref 98–111)
Creatinine, Ser: 2.36 mg/dL — ABNORMAL HIGH (ref 0.61–1.24)
Creatinine, Ser: 2.87 mg/dL — ABNORMAL HIGH (ref 0.61–1.24)
GFR, Estimated: 28 mL/min — ABNORMAL LOW (ref >60)
GFR, Estimated: 22 mL/min — ABNORMAL LOW (ref >60)
Glucose, Bld: 129 mg/dL — ABNORMAL HIGH (ref 70–99)
Glucose, Bld: 163 mg/dL — ABNORMAL HIGH (ref 70–99)
Potassium: 4.4 mmol/L (ref 3.5–5.1)
Potassium: 4.9 mmol/L (ref 3.5–5.1)
Sodium: 140 mmol/L (ref 135–145)
Sodium: 140 mmol/L (ref 135–145)

## 2023-09-28 LAB — COOXEMETRY PANEL
Carboxyhemoglobin: 1.4 % (ref 0.5–1.5)
Carboxyhemoglobin: 3.1 % — ABNORMAL HIGH (ref 0.5–1.5)
Methemoglobin: 1.1 % (ref 0.0–1.5)
Methemoglobin: <0.7 % (ref 0.0–1.5)
O2 Saturation: 67.8 %
O2 Saturation: 72.1 %
Total hemoglobin: 9.1 g/dL — ABNORMAL LOW (ref 12.0–16.0)
Total hemoglobin: 9.4 g/dL — ABNORMAL LOW (ref 12.0–16.0)

## 2023-09-28 LAB — POCT I-STAT 7, (LYTES, BLD GAS, ICA,H+H)
Acid-base deficit: 3 mmol/L — ABNORMAL HIGH (ref 0.0–2.0)
Acid-base deficit: 4 mmol/L — ABNORMAL HIGH (ref 0.0–2.0)
Acid-base deficit: 5 mmol/L — ABNORMAL HIGH (ref 0.0–2.0)
Bicarbonate: 20.5 mmol/L (ref 20.0–28.0)
Bicarbonate: 21.2 mmol/L (ref 20.0–28.0)
Bicarbonate: 21.9 mmol/L (ref 20.0–28.0)
Calcium, Ion: 1.15 mmol/L (ref 1.15–1.40)
Calcium, Ion: 1.15 mmol/L (ref 1.15–1.40)
Calcium, Ion: 1.16 mmol/L (ref 1.15–1.40)
HCT: 24 % — ABNORMAL LOW (ref 39.0–52.0)
HCT: 27 % — ABNORMAL LOW (ref 39.0–52.0)
HCT: 28 % — ABNORMAL LOW (ref 39.0–52.0)
Hemoglobin: 8.2 g/dL — ABNORMAL LOW (ref 13.0–17.0)
Hemoglobin: 9.2 g/dL — ABNORMAL LOW (ref 13.0–17.0)
Hemoglobin: 9.5 g/dL — ABNORMAL LOW (ref 13.0–17.0)
O2 Saturation: 93 %
O2 Saturation: 93 %
O2 Saturation: 96 %
Patient temperature: 35.8
Patient temperature: 36.9
Patient temperature: 37
Potassium: 4.3 mmol/L (ref 3.5–5.1)
Potassium: 4.6 mmol/L (ref 3.5–5.1)
Potassium: 4.6 mmol/L (ref 3.5–5.1)
Sodium: 140 mmol/L (ref 135–145)
Sodium: 142 mmol/L (ref 135–145)
Sodium: 142 mmol/L (ref 135–145)
TCO2: 22 mmol/L (ref 22–32)
TCO2: 22 mmol/L (ref 22–32)
TCO2: 23 mmol/L (ref 22–32)
pCO2 arterial: 37.1 mmHg (ref 32–48)
pCO2 arterial: 38.4 mmHg (ref 32–48)
pCO2 arterial: 39.7 mmHg (ref 32–48)
pH, Arterial: 7.336 — ABNORMAL LOW (ref 7.35–7.45)
pH, Arterial: 7.336 — ABNORMAL LOW (ref 7.35–7.45)
pH, Arterial: 7.374 (ref 7.35–7.45)
pO2, Arterial: 66 mmHg — ABNORMAL LOW (ref 83–108)
pO2, Arterial: 73 mmHg — ABNORMAL LOW (ref 83–108)
pO2, Arterial: 87 mmHg (ref 83–108)

## 2023-09-28 LAB — MAGNESIUM
Magnesium: 3.4 mg/dL — ABNORMAL HIGH (ref 1.7–2.4)
Magnesium: 3.2 mg/dL — ABNORMAL HIGH (ref 1.7–2.4)

## 2023-09-28 LAB — TRIGLYCERIDES: Triglycerides: 66 mg/dL (ref <150)

## 2023-09-28 LAB — CBC
HCT: 29.9 % — ABNORMAL LOW (ref 39.0–52.0)
HCT: 30.2 % — ABNORMAL LOW (ref 39.0–52.0)
Hemoglobin: 9.6 g/dL — ABNORMAL LOW (ref 13.0–17.0)
Hemoglobin: 9.6 g/dL — ABNORMAL LOW (ref 13.0–17.0)
MCH: 29 pg (ref 26.0–34.0)
MCH: 28.7 pg (ref 26.0–34.0)
MCHC: 32.1 g/dL (ref 30.0–36.0)
MCHC: 31.8 g/dL (ref 30.0–36.0)
MCV: 90.3 fL (ref 80.0–100.0)
MCV: 90.1 fL (ref 80.0–100.0)
Platelets: 176 10*3/uL (ref 150–400)
Platelets: 165 10*3/uL (ref 150–400)
RBC: 3.31 MIL/uL — ABNORMAL LOW (ref 4.22–5.81)
RBC: 3.35 MIL/uL — ABNORMAL LOW (ref 4.22–5.81)
RDW: 14.2 % (ref 11.5–15.5)
RDW: 14.3 % (ref 11.5–15.5)
WBC: 8.8 10*3/uL (ref 4.0–10.5)
WBC: 12 10*3/uL — ABNORMAL HIGH (ref 4.0–10.5)
nRBC: 0 % (ref 0.0–0.2)
nRBC: 0 % (ref 0.0–0.2)

## 2023-09-28 MED ORDER — INSULIN ASPART 100 UNIT/ML IJ SOLN
0.0000 [IU] | INTRAMUSCULAR | Status: DC
  Administered 2023-09-28: 4 [IU] via SUBCUTANEOUS
  Administered 2023-09-28 (×2): 2 [IU] via SUBCUTANEOUS
  Administered 2023-09-29: 8 [IU] via SUBCUTANEOUS
  Administered 2023-09-29: 4 [IU] via SUBCUTANEOUS
  Administered 2023-09-29: 8 [IU] via SUBCUTANEOUS

## 2023-09-28 MED ORDER — ENOXAPARIN SODIUM 40 MG/0.4ML IJ SOSY: 40.0000 mg | PREFILLED_SYRINGE | Freq: Every day | INTRAMUSCULAR | Status: DC

## 2023-09-28 MED ORDER — INSULIN GLARGINE-YFGN 100 UNIT/ML ~~LOC~~ SOLN
35.0000 [IU] | Freq: Two times a day (BID) | SUBCUTANEOUS | Status: DC
  Administered 2023-09-28: 35 [IU] via SUBCUTANEOUS
  Filled 2023-09-28 (×3): qty 0.35

## 2023-09-28 MED ORDER — ENOXAPARIN SODIUM 30 MG/0.3ML IJ SOSY
30.0000 mg | PREFILLED_SYRINGE | Freq: Every day | INTRAMUSCULAR | Status: DC
  Administered 2023-09-28 – 2023-09-29 (×2): 30 mg via SUBCUTANEOUS
  Filled 2023-09-28 (×2): qty 0.3

## 2023-09-28 MED ORDER — ORAL CARE MOUTH RINSE: 15.0000 mL | OROMUCOSAL | Status: DC | PRN

## 2023-09-28 MED ORDER — INSULIN GLARGINE-YFGN 100 UNIT/ML ~~LOC~~ SOLN
20.0000 [IU] | Freq: Two times a day (BID) | SUBCUTANEOUS | Status: DC
  Administered 2023-09-28: 20 [IU] via SUBCUTANEOUS
  Filled 2023-09-28 (×3): qty 0.2

## 2023-09-28 MED ORDER — FUROSEMIDE 10 MG/ML IJ SOLN: 40.0000 mg | Freq: Two times a day (BID) | INTRAMUSCULAR | Status: DC

## 2023-09-28 MED ORDER — FUROSEMIDE 10 MG/ML IJ SOLN
80.0000 mg | Freq: Two times a day (BID) | INTRAMUSCULAR | Status: DC
  Administered 2023-09-28 – 2023-09-29 (×3): 80 mg via INTRAVENOUS
  Filled 2023-09-28 (×3): qty 8

## 2023-09-28 MED FILL — Heparin Sodium (Porcine) Inj 1000 Unit/ML: INTRAMUSCULAR | Qty: 20 | Status: AC

## 2023-09-28 MED FILL — Sodium Chloride IV Soln 0.9%: INTRAVENOUS | Qty: 2000 | Status: AC

## 2023-09-28 MED FILL — Mannitol IV Soln 20%: INTRAVENOUS | Qty: 500 | Status: AC

## 2023-09-28 MED FILL — Calcium Chloride Inj 10%: INTRAVENOUS | Qty: 10 | Status: AC

## 2023-09-28 MED FILL — Heparin Sodium (Porcine) Inj 1000 Unit/ML: INTRAMUSCULAR | Qty: 30 | Status: AC

## 2023-09-28 MED FILL — Sodium Bicarbonate IV Soln 8.4%: INTRAVENOUS | Qty: 50 | Status: AC

## 2023-09-28 MED FILL — Electrolyte-R (PH 7.4) Solution: INTRAVENOUS | Qty: 6000 | Status: AC

## 2023-09-28 MED FILL — Albumin, Human Inj 5%: INTRAVENOUS | Qty: 250 | Status: AC

## 2023-09-28 MED FILL — Lidocaine HCl Local Soln Prefilled Syringe 100 MG/5ML (2%): INTRAMUSCULAR | Qty: 5 | Status: AC

## 2023-09-28 NOTE — Progress Notes (Signed)
 1 Day Post-Op Procedure(s) (LRB): CORONARY ARTERY BYPASS GRAFTING (CABG) TIMES FOUR UTILIZING THE LEFT INTERNAL MAMMARY ARTERY AND ENDOSCOPIC VEIN HARVEST RIGHT GREATER SAPHENOUS VEIN (N/A) ECHOCARDIOGRAM, TRANSESOPHAGEAL, INTRAOPERATIVE (N/A) Subjective: Intubated but alert and interactive  Objective: Vital signs in last 24 hours: Temp:  [90.5 F (32.5 C)-98.8 F (37.1 C)] 98.4 F (36.9 C) (06/05 0800) Pulse Rate:  [72-84] 79 (06/05 0800) Cardiac Rhythm: Normal sinus rhythm (06/05 0400) Resp:  [9-29] 17 (06/05 0800) BP: (86-115)/(57-75) 109/60 (06/05 0800) SpO2:  [93 %-100 %] 96 % (06/05 0800) Arterial Line BP: (66-180)/(26-85) 106/53 (06/05 0800) FiO2 (%):  [50 %-70 %] 50 % (06/05 0800) Weight:  [104.6 kg] 104.6 kg (06/05 0500)  Hemodynamic parameters for last 24 hours: PAP: (19-65)/(4-36) 35/21 CVP:  [7 mmHg-46 mmHg] 14 mmHg PCWP:  [17 mmHg-18 mmHg] 18 mmHg CO:  [3.1 L/min-4.9 L/min] 4.9 L/min CI:  [1.6 L/min/m2-2.5 L/min/m2] 2.5 L/min/m2  Intake/Output from previous day: 06/04 0701 - 06/05 0700 In: 6687.7 [I.V.:3945.6; Blood:480; IV Piggyback:2262.2] Out: 2441 [Urine:2035; Emesis/NG output:150; Chest Tube:256] Intake/Output this shift: Total I/O In: 58.3 [I.V.:58.3] Out: 15 [Urine:10; Chest Tube:5]  General appearance: alert, cooperative, and no distress Neurologic: intact Heart: regular rate and rhythm Lungs: diminished breath sounds bibasilar Abdomen: mildly distended, nontender  Lab Results: Recent Labs    09/27/23 2155 09/28/23 0014 09/28/23 0407 09/28/23 0415  WBC 8.8  --   --  8.8  HGB 8.9*   < > 9.2* 9.6*  HCT 28.1*   < > 27.0* 29.9*  PLT 162  --   --  176   < > = values in this interval not displayed.   BMET:  Recent Labs    09/27/23 2155 09/28/23 0014 09/28/23 0407 09/28/23 0415  NA 142   < > 142 140  K 4.7   < > 4.6 4.4  CL 109  --   --  109  CO2 24  --   --  22  GLUCOSE 150*  --   --  129*  BUN 19  --   --  20  CREATININE 1.94*  --    --  2.36*  CALCIUM  8.1*  --   --  7.9*   < > = values in this interval not displayed.    PT/INR:  Recent Labs    09/27/23 1604  LABPROT 16.4*  INR 1.3*   ABG    Component Value Date/Time   PHART 7.336 (L) 09/28/2023 0407   HCO3 21.2 09/28/2023 0407   TCO2 22 09/28/2023 0407   ACIDBASEDEF 4.0 (H) 09/28/2023 0407   O2SAT 67.8 09/28/2023 0553   CBG (last 3)  Recent Labs    09/28/23 0405 09/28/23 0614 09/28/23 0801  GLUCAP 143* 131* 165*    Assessment/Plan: S/P Procedure(s) (LRB): CORONARY ARTERY BYPASS GRAFTING (CABG) TIMES FOUR UTILIZING THE LEFT INTERNAL MAMMARY ARTERY AND ENDOSCOPIC VEIN HARVEST RIGHT GREATER SAPHENOUS VEIN (N/A) ECHOCARDIOGRAM, TRANSESOPHAGEAL, INTRAOPERATIVE (N/A) POD # 1 NEURO- intact CV- in Sr with good hemodynamics  CI 2.5, co-ox 67  Continue epi and milrinone   ASA, add Plavix after pacing wires out  Statin, beta blocker RESP- extubate this AM  IS for atelectasis  Have asked CCM to see RENAL- creatinine up slightly as expected  Diurese   Monitor renal function/ lytes ENDO- CBG well controlled on insulin  drip  Transition to Semglee  + SSI GI advance diet once extubated Anemia- received 1 unit PRBC yesterday  Hgb 9 monitor Dc chest tubes SCD + enoxaparin  mobilize  LOS: 7 days    [name] 09/28/2023

## 2023-09-28 NOTE — Plan of Care (Signed)
  Problem: Skin Integrity: Goal: Risk for impaired skin integrity will decrease Outcome: Progressing   Problem: Coping: Goal: Level of anxiety will decrease Outcome: Progressing   Problem: Pain Managment: Goal: General experience of comfort will improve and/or be controlled Outcome: Progressing   Problem: Safety: Goal: Ability to remain free from injury will improve Outcome: Progressing

## 2023-09-28 NOTE — Progress Notes (Signed)
 Heart Failure Navigator Progress Note  Assessed for Heart & Vascular TOC clinic readiness.  Patient does not meet criteria due to Advanced Heart Failure Team patient of Dr. Shirlee Latch.   Navigator will sign off at this time.    Rhae Hammock, BSN, Scientist, clinical (histocompatibility and immunogenetics) Only

## 2023-09-28 NOTE — Op Note (Signed)
 [name], [name] MEDICAL RECORD NO: [id number] ACCOUNT NO: [id number] DATE OF BIRTH: October 08, 1950 FACILITY: [facility] LOCATION: [facility] PHYSICIAN: Milon Aloe. [name], MD  Operative Report   DATE OF PROCEDURE: 09/27/2023  PREOPERATIVE DIAGNOSIS: Severe three-vessel coronary artery disease.  POSTOPERATIVE DIAGNOSIS: Severe three-vessel coronary artery disease.  PROCEDURE: Median sternotomy, extracorporeal circulation,  Coronary artery bypass grafting x4  Left internal mammary artery to LAD,  Saphenous vein graft to posterior descending,  Sequential saphenous vein graft to obtuse marginals 2 and 3 Endoscopic vein harvest right leg.  SURGEON: Milon Aloe. [name], MD.  ASSISTANT: Debroah Fanning, PA.  Experienced assistance was necessary for this case due to surgical complexity. Valaria Garland assisted by independently harvesting the saphenous vein from the right leg endoscopically and closing the leg incision. She then assisted with exposure, retraction of delicate tissues, suture management, and suctioning during the anastomosis.   ANESTHESIA: General.  FINDINGS: Transesophageal echocardiography revealed ejection fraction of approximately 30%. No significant change post bypass. Trivial mitral regurgitation. Diffusely diseased, poor quality target vessels. Good quality conduits. Not a candidate for redo bypass grafting.  CLINICAL NOTE: [name] is a 73 year old with multiple cardiac risk factors who presented with chest pain and ruled in for a non-ST elevation MI. He initially had severely elevated left ventricular end diastolic pressure. Catheterization showed severe three-vessel coronary disease with impaired left ventricular function with an ejection fraction of approximately 30% by echocardiogram. He was referred for coronary artery bypass grafting. The indications, risks, benefits, and alternatives were discussed in detail with the patient.  He understood the risks, accepted them and  agreed to proceed.  OPERATIVE NOTE: [name] was brought to the operating room on 09/27/2023. He had induction of general anesthesia. Dr. Natalie Bailey of anesthesia performed transesophageal echocardiography. Please see his separately dictated note for full details of the procedure. A Foley catheter was placed. Intravenous antibiotics were administered. The chest, abdomen, and legs were prepped and draped in the usual sterile fashion.  A time-out was performed. A median sternotomy was performed and the left internal mammary artery was harvested under direct vision. Simultaneously, an incision was made in the medial aspect of the right leg at the level of the knee. The greater saphenous vein was harvested from the upper calf to the groin endoscopically. Both the vein and mammary artery were good quality vessels. 2000 units of heparin  was administered during the vessel harvest. After the vessel harvest was completed, the remainder of  the full heparin  dose was administered prior to opening the pericardium.  A sternal retractor was placed and was gradually opened over time. The pericardium was opened. There was a small bloody pericardial effusion. This was non-clotting. The ascending aorta was inspected. It was of normal caliber with no evidence of atherosclerotic disease. After confirming adequate anticoagulation with ACT measurement, the aorta was cannulated via concentric 2-0 Ethibond pledged pursestring sutures. A dual-stage venous cannula was placed via a pursestring suture in the right atrial appendage. Cardiopulmonary bypass was initiated. Flows were maintained per protocol. The patient was cooled to 32 degrees Celsius. The coronary arteries were inspected and anastomotic sites were chosen. The conduits were inspected and cut to length. A foam pad was placed in the pericardium to insulate the heart. A temperature probe was placed in the myocardial septum and a cardioplegia cannula was placed in the  ascending aorta and a retrograde cardioplegia cannula was placed via pursestring suture in the right atrium and directed into the coronary sinus.  The aorta was cross-clamped. The  left ventricle was emptied via the aortic root vent. Cardiac arrest then was achieved with a combination of cold antegrade and retrograde blood cardioplegia and topical ice saline. An initial 650 mL of cardioplegia was administered antegrade. There was a rapid diastolic arrest and rapid septal cooling. An additional 250 mL of cardioplegia was administered retrograde. There was septal cooling to 9 degrees Celsius.  A reversed saphenous vein graft then was placed end-to side the posterior descending branch of the right coronary. This vessel had about an 80% stenosis proximally. It was diffusely diseased distally. A 1 mm probe did pass distally, but the vessel was heavily calcified beyond the anastomotic site. The vein was anastomosed end to side with a running 7-0 Prolene suture. Cardioplegia was administered down the graft and there was good flow and good hemostasis.  Next, a reversed saphenous vein graft was placed going to the obtuse marginals 2 and 3. These were the largest of the lateral branches. Both were 1.5 mm vessels, but both were heavily diseased and diffusely diseased and both only accepted a 1 mm probe distally. A side-to-side anastomosis was performed to OM2 and an end-to-side OM3. Both were done with running 7-0 Prolene sutures. The probe did pass distally at each anastomosis prior to tying the suture. Cardioplegia was administered down the graft and there was good flow and good hemostasis.  The left internal mammary was brought through a window in the pericardium. The distal end was beveled. It was anastomosed end-to side to the distal LAD. The LAD was good quality to the site of the anastomosis, but was heavily diseased both proximal and distal to that site. A 1-mm probe did pass distally to the apex and for a  moderate distance proximally. An end-to side anastomosis was performed with a running 8-0 Prolene suture. At the completion of the anastomosis, the bulldog clamp was briefly removed to inspect for hemostasis. There was some bleeding near the toe and an 8-0 Prolene suture was used for that. The mammary pedicle was tacked to the epicardial surface of the heart with 6-0 Prolene sutures.  Additional cardioplegia was administered via the retrograde cannula. The vein grafts were cut to length. The cardioplegia cannula was removed from the ascending aorta. The proximal vein graft anastomoses were performed to 4.0 mm punch aortotomies with running 6-0 Prolene sutures. At the completion of the final proximal anastomosis, the patient was placed in Trendelenburg position. Lidocaine  was administered. The aortic root was de-aired and the aortic cross clamp was removed. The total cross-clamp time was 77 minutes.  While rewarming was completed, the distal anastomoses were inspected for hemostasis. There was more bleeding from the mammary to LAD anastomosis. Further inspection revealed that this bleeding was not likely to be controlled with a patch suture and the decision was made to take down the anastomosis and redo it. Antegrade cardioplegia cannula was placed via a pursestring suture in the ascending aorta. This was a 4-0 pledgeted suture. The aorta was cross-clamped. Cardiac arrest was achieved with 1 liter of antegrade cardioplegia. There was a rapid diastolic arrest. The LIMA to LAD anastomosis was taken down. The mammary artery was cut fresh and then beveled. It was then re-anastomosed to the LAD with a running 8-0 Prolene suture. At the completion of this anastomosis, the bulldog clamp was removed and there was good hemostasis at the anastomosis. The mammary pedicle was tacked to the epicardial surface of the heart with 6-0 Prolene sutures.  Epicardial pacing wires then were placed on  the right ventricle and right  atrium. The antegrade cardioplegia cannula was removed and the suture was tied. When the patient had rewarmed to a core temperature of 37 degrees Celsius, a Milrinone  infusion was initiated at 0.25 mcg per kilogram per minute and an epinephrine  infusion was initiated at 3 mcg per kilogram per minute. The patient failed to wean from bypass on the first two attempts despite adjustments in his dosing and he was placed back on bypass and the heart was allowed to rest for 20 minutes. Acidosis and hypercalcemia were corrected. He then weaned from bypass without difficulty on the third attempt. He was on milrinone  at 0.3 mcg per kilogram per minute, epinephrine  at 4 mcg per minute, and norepinephrine  at 10 mcg per minute. He was DDD paced at 80 beats per minute. The initial cardiac index was 1.8 and he had labile blood pressure, but transesophageal echocardiography showed LV function essentially unchanged from the pre-bypass study. A test dose of Protamine  was administered. The atrial and aortic cannulae were removed. The remainder of the Protamine  was administered without incident. The chest was irrigated with warm saline. Hemostasis was achieved. Left pleural and mediastinal chest  tubes were placed through separate subcostal incisions. The pericardium was not closed. The sternum was closed with a combination of single and double-heavy gauge stainless steel wires. The patient had a drop in his cardiac index to 1.5 liters per minute per meter squared after closure of the chest without any other hemodynamic changes. His index improved with volume resuscitation.  The pectoralis fascia and subcutaneous tissue and skin were closed in standard fashion. All sponge, needle, and instrument counts were correct at the end of the procedure. The patient was transported from the operating room to the surgical intensive care unit, intubated, and in fair condition.    SUJ D: 09/27/2023 5:17:39 pm T: 09/28/2023 2:27:00 am  JOB:  [id number]/ [id number]

## 2023-09-28 NOTE — Discharge Summary (Signed)
 [address]       [place] [address]             [phone]    Physician Discharge Summary  Patient ID: [name] MRN: [id number] DOB/AGE: [birth year] 73 y.o.  Admit date: 09/21/2023 Discharge date: 10/13/2023  Admission Diagnoses:  Patient Active Problem List   Diagnosis Date Noted   Acute on chronic combined systolic and diastolic heart failure (HCC) 09/25/2023   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   Ischemic cardiomyopathy 09/23/2023   Type 2 diabetes mellitus with hyperglycemia, with long-term current use of insulin  (HCC) 09/23/2023   Type 2 diabetes mellitus with hyperlipidemia (HCC) 09/23/2023   AKI (acute kidney injury) (HCC) 09/23/2023   Benign hypertension with CKD (chronic kidney disease) stage III (HCC) 09/23/2023   Chronic health problem 09/22/2023   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) 09/22/2023   NSTEMI (non-ST elevated myocardial infarction) (HCC) 09/21/2023   Type 2 diabetes mellitus with diabetic chronic kidney disease (HCC) 09/21/2023   CKD (chronic kidney disease) 09/21/2023   Pulmonary nodule 09/21/2023   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) 01/24/2022   Non-cardiac chest pain    Coronary artery disease involving native coronary artery of native heart without angina pectoris    Hypercholesterolemia    Atypical angina (HCC) 01/22/2022   Low-level of literacy 05/21/2018   Globus sensation 03/12/2018   Type 2 diabetes mellitus without complication, with long-term current use of insulin  (HCC) 06/09/2017   Pure hypercholesterolemia 06/09/2017   History of coronary artery stent placement 06/09/2017   Essential hypertension, benign 06/09/2017     Discharge Diagnoses:  Patient Active Problem List   Diagnosis Date Noted   Physical deconditioning 10/05/2023   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   Acute on chronic combined systolic and diastolic heart failure (HCC) 09/25/2023   Elevated  lipoprotein(a) 09/25/2023   Ischemic cardiomyopathy 09/23/2023   Type 2 diabetes mellitus with hyperglycemia, with long-term current use of insulin  (HCC) 09/23/2023   Type 2 diabetes mellitus with hyperlipidemia (HCC) 09/23/2023   AKI (acute kidney injury) (HCC) 09/23/2023   Benign hypertension with CKD (chronic kidney disease) stage III (HCC) 09/23/2023   Chronic health problem 09/22/2023   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) 09/22/2023   NSTEMI (non-ST elevated myocardial infarction) (HCC) 09/21/2023   Type 2 diabetes mellitus with diabetic chronic kidney disease (HCC) 09/21/2023   CKD (chronic kidney disease) 09/21/2023   Pulmonary nodule 09/21/2023   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 is a 73 year old male with a past medical history of coronary artery disease (PCI of LAD in 2005 in [place] per patient but no records available), diastolic dysfunction, cardiomyopathy. uncontrolled diabetes mellitus, hypertension, hyperlipidemia, and chronic kidney disease stage IIIa. The patient saw a pharmacist on 05/13 at the pharmacy clinic and was noted to have atypical chest pain including burning chest pain,  the use of nitroglycerin  was reviewed as well as when to call 911. He then presented to the ED on 05/29 with complaints of chest pain, nausea, vomiting, shortness of breath and  neck pain that was not relieved with the use of nitroglycerin . He reports the chest pain started about 1 week prior and was constant. This worsened with exertion and he would become diaphoretic but this was more severe with radiation the the left jaw/neck and associated with nausea and vomiting. On arrival to the ED he was tachycardic and Troponin I (high sensitivity) peaked at 9,751. He was ruled in for NSTEMI and started on heparin . He underwent heart catheterization on 05/29 which showed 90% stenosis of the proximal, mid and distal RCA, 80-90% stenosis of OM1, OM2, and OM3, and 80-90% stenosis of the proximal to mid LAD including 80% stenosis of the lesion that was previously treated. His LVEDP was severely elevated at 38-13mmHg and he was given IV Lasix  with recommendations to aggressively diurese prior to surgery. He underwent echocardiogram on 09/21/23 which was technically difficulty but measured his LVEF 35-40% with moderately decreased function of the left ventricle and global hypokinesis, he did have aortic valve sclerosis without evidence of aortic stenosis, no other valve abnormalities were noted.  His diabetes is poorly controlled.  He admits to non-compliance stating he doesn't take his medication when he feels good.    Hospital Course:  Cardiothoracic surgical consultation was requested.  The patient was evaluated by Dr. Kerrin who was in agreement he would benefit from coronary bypass grafting.  He did feel patient would benefit from additional diuresis and better control of his blood sugars.  The risks and benefits of the procedure were explained to the patient and he was agreeable to proceed.  He was taken to the operating room on 09/27/2023.  He underwent CABG x 4 utilizing LIMA to LAD, SVG to OM1 sequential to OM3, and SVG to PDA, as well as endoscopic harvest of the right greater saphenous vein. He tolerated the procedure well and was transferred to the SICU in stable condition. He was  extubated without difficulty on POD1. He required epinephrine  and milrinone  for hemodynamic support, this was weaned as hemodynamics tolerated. Critical care was consulted and assisted weaning off milrinone . He has expected postoperative blood loss anemia and was transfused with 1U PRBCs. Epicardial pacing wires were removed on POD2 without complication. He did developed a postoperative ileus, reglan  was started and NG tube was placed.  He was initiated on CRRT to help with volume management.  He remained on Milrinone  and Epinephrine  on 6/9. Ileus began resolving and he was started on a renal diet on POD6. He was started on Plavix  for hx of NSTEMI. Milrinone  was discontinued on 06/11. CRRT was discontinued on 06/11.  Nephrology followed the patient closely.  He was making some urine. Nephrology was following and he was not felt to require hemodialysis. PT/OT evaluations recommended CIR, they were following closely for possible admission. Low dose Coreg  was started.  His blood sugars were not well controlled and his insulin  regimen was increased as indicated.  He was felt stable for transfer to the progressive unit on 06/16. His creatinine was improving and urine output increased, nephrology determined he would not require hemodialysis and removed his dialysis catheter. He was started on Bidil  per AHF team. He had acute expected postoperative blood loss anemia that was trending down, he was transfused with 1U PRBCs. Hemoccult was negative. His iron  and iron  saturation were low,  he was given IV iron  prior to discharge and was started on a PO iron  supplement at discharge. He was started on Torsemide  daily per AHF. He was ambulating well and saturating well on room air. His incisions were healing well without sign of infection. He was felt stable for discharge home.   Consults: nephrology, advanced heart failure, critical care  Significant Diagnostic Studies:  LEFT HEART CATH AND CORONARY ANGIOGRAPHY     Prox RCA  lesion is 90% stenosed.   Mid RCA lesion is 90% stenosed.   Dist RCA lesion is 90% stenosed.   RPDA lesion is 80% stenosed.   1st Mrg lesion is 80% stenosed.   2nd Mrg lesion is 90% stenosed.   3rd Mrg lesion is 80% stenosed.   Prox LAD to Mid LAD lesion is 80% stenosed.   Mid LAD-1 lesion is 80% stenosed.   Mid LAD-2 lesion is 90% stenosed.    Severe multivessel disease. Severely elevated LVEDP of 38 to 40 mmHg; Lasix  40 mg IV x 1 was administered.  ECHOCARDIOGRAM REPORT    Patient Name:   JUWANN SHERK Date of Exam: 09/21/2023  Medical Rec #:  [id number]  Height:       64.0 in  Accession #:    [id number] Weight:       214.0 lb  Date of Birth:  1950/09/10   BSA:          2.013 m  Patient Age:    73 years   BP:           178/106 mmHg  Patient Gender: M          HR:           98 bpm.  Exam Location:  Inpatient   Procedure: 2D Echo, Cardiac Doppler, Color Doppler and Intracardiac             Opacification Agent (Both Spectral and Color Flow Doppler were             utilized during procedure).   Indications:    NSTEMI    History:        Patient has prior history of Echocardiogram examinations,  most                 recent 01/23/2022. CAD; Risk Factors:Hypertension,  Dyslipidemia                 and Diabetes.    Sonographer:    Jayson Gaskins  Referring Phys: [id number] CALLIE E GOODRICH   IMPRESSIONS   1. Left ventricular ejection fraction, by estimation, is 35 to 40%. The  left ventricle has moderately decreased function. The left ventricle  demonstrates global hypokinesis. Left ventricular diastolic function could  not be evaluated.   2. Right ventricular systolic function was not well visualized. The right  ventricular size is not well visualized.   3. The mitral valve is normal in structure. No evidence of mitral valve  regurgitation. No evidence of mitral stenosis.   4. The aortic valve is tricuspid. Aortic valve regurgitation is not  visualized. Aortic valve  sclerosis/calcification is present, without any  evidence of aortic stenosis. Aortic valve area, by VTI measures 2.06 cm.  Aortic valve mean gradient measures  2.0 mmHg. Aortic valve Vmax measures 0.98 m/s.   5. The inferior vena cava is normal in size with greater than 50%  respiratory variability, suggesting right atrial pressure of 3 mmHg.   FINDINGS   Left Ventricle: Left ventricular ejection  fraction, by estimation, is 35  to 40%. The left ventricle has moderately decreased function. The left  ventricle demonstrates global hypokinesis. Definity  contrast agent was  given IV to delineate the left  ventricular endocardial borders. The left ventricular internal cavity size  was normal in size. There is no left ventricular hypertrophy. Left  ventricular diastolic function could not be evaluated.   Right Ventricle: The right ventricular size is not well visualized. Right  vetricular wall thickness was not assessed. Right ventricular systolic  function was not well visualized.   Left Atrium: Left atrial size was normal in size.   Right Atrium: Right atrial size was normal in size.   Pericardium: There is no evidence of pericardial effusion.   Mitral Valve: The mitral valve is normal in structure. No evidence of  mitral valve regurgitation. No evidence of mitral valve stenosis.   Tricuspid Valve: The tricuspid valve is normal in structure. Tricuspid  valve regurgitation is not demonstrated. No evidence of tricuspid  stenosis.   Aortic Valve: The aortic valve is tricuspid. Aortic valve regurgitation is  not visualized. Aortic valve sclerosis/calcification is present, without  any evidence of aortic stenosis. Aortic valve mean gradient measures 2.0  mmHg. Aortic valve peak gradient  measures 3.9 mmHg. Aortic valve area, by VTI measures 2.06 cm.   Pulmonic Valve: The pulmonic valve was normal in structure. Pulmonic valve  regurgitation is not visualized. No evidence of pulmonic  stenosis.   Aorta: The aortic root is normal in size and structure.   Venous: The inferior vena cava is normal in size with greater than 50%  respiratory variability, suggesting right atrial pressure of 3 mmHg.   IAS/Shunts: No atrial level shunt detected by color flow Doppler.     LEFT VENTRICLE  PLAX 2D  LVIDd:         4.70 cm  LVIDs:         3.80 cm  LV PW:         0.70 cm  LV IVS:        0.90 cm  LVOT diam:     1.80 cm  LV SV:         35  LV SV Index:   17  LVOT Area:     2.54 cm     RIGHT VENTRICLE  RV S prime:     13.40 cm/s  TAPSE (M-mode): 1.8 cm   LEFT ATRIUM             Index  LA Vol (A2C):   33.5 ml 16.64 ml/m  LA Vol (A4C):   11.4 ml 5.67 ml/m  LA Biplane Vol: 27.9 ml 13.86 ml/m   AORTIC VALVE  AV Area (Vmax):    2.11 cm  AV Area (Vmean):   2.22 cm  AV Area (VTI):     2.06 cm  AV Vmax:           98.30 cm/s  AV Vmean:          71.900 cm/s  AV VTI:            0.169 m  AV Peak Grad:      3.9 mmHg  AV Mean Grad:      2.0 mmHg  LVOT Vmax:         81.40 cm/s  LVOT Vmean:        62.700 cm/s  LVOT VTI:          0.137 m  LVOT/AV VTI ratio: 0.81  MITRAL VALVE  MV Area (PHT): 5.84 cm     SHUNTS  MV Decel Time: 130 msec     Systemic VTI:  0.14 m  MV E velocity: 123.00 cm/s  Systemic Diam: 1.80 cm   Wilbert Bihari MD  Electronically signed by Wilbert Bihari MD  Signature Date/Time: 09/21/2023/5:07:15 PM        Final     Treatments: surgery:  Operative Report    DATE OF PROCEDURE: 09/27/2023   PREOPERATIVE DIAGNOSIS: Severe three-vessel coronary artery disease.   POSTOPERATIVE DIAGNOSIS: Severe three-vessel coronary artery disease.   PROCEDURE: Median sternotomy, extracorporeal circulation, coronary artery bypass grafting x4 (left internal mammary artery to LAD, saphenous vein graft to posterior descending, sequential saphenous vein graft to obtuse marginals 2 and 3), endoscopic vein  harvest right leg.   SURGEON: Elspeth BROCKS. Kerrin,  MD.  Discharge Exam: Blood pressure 133/85, pulse 87, temperature 98.5 F (36.9 C), temperature source Oral, resp. rate 20, height 5' 4 (1.626 m), weight 88.7 kg, SpO2 97%. General appearance: alert, cooperative, and no distress Neurologic: intact Heart: regular rate and rhythm, S1, S2 normal, no murmur, click, rub or gallop Lungs: clear to auscultation bilaterally Abdomen: soft, non-tender; bowel sounds normal; no masses,  no organomegaly Extremities: extremities normal, atraumatic, no cyanosis or edema Wound: Clean and dry without sign of infection  Discharge Medications:  The patient has been discharged on:   1.Beta Blocker:  Yes [ X  ]                              No   [   ]                              If No, reason:  2.Ace Inhibitor/ARB: Yes [   ]                                     No  [  X  ]                                     If No, reason: On Bidil  per AHF, avoiding hypotension due to renal failure  3.Statin:   Yes [ X  ]                  No  [   ]                  If No, reason:  4.Ecasa:  Yes  [ X  ]                  No   [   ]                  If No, reason:  Patient had ACS upon admission: Yes  Plavix /P2Y12 inhibitor: Yes [  X ]                                      No  [   ]     Discharge Instructions     Amb Referral to Cardiac Rehabilitation   Complete by:  As directed    Diagnosis: CABG   CABG X ___: 4   After initial evaluation and assessments completed: Virtual Based Care may be provided alone or in conjunction with Phase 2 Cardiac Rehab based on patient barriers.: Yes   Intensive Cardiac Rehabilitation (ICR) [facility] location only OR Traditional Cardiac Rehabilitation (TCR) *If criteria for ICR are not met will enroll in TCR ([facility] only): Yes      Allergies as of 10/13/2023       Reactions   Trulicity [dulaglutide] Other (See Comments)   Patient indicated this caused pancreatitis.         Medication List     STOP taking these medications     eplerenone  25 MG tablet Commonly known as: INSPRA    nitroGLYCERIN  0.3 MG SL tablet Commonly known as: NITROSTAT    rosuvastatin  40 MG tablet Commonly known as: CRESTOR        TAKE these medications    Accu-Chek Guide test strip Generic drug: glucose blood Use as instructed   Accu-Chek Softclix Lancets lancets USE AS DIRECTED UP TO FOUR TIMES DAILY   aspirin  EC 81 MG tablet Take 81 mg by mouth daily.   atorvastatin  80 MG tablet Commonly known as: LIPITOR  Take 1 tablet (80 mg total) by mouth daily.   Basaglar  KwikPen 100 UNIT/ML DIAL AND INJECT 65 UNITS UNDER THE SKIN DAILY.   BD Pen Needle Nano 2nd Gen 32G X 4 MM Misc Generic drug: Insulin  Pen Needle USE 3 TIMES DAILY AS NEEDED BETWEEN MEALS AND BEDTIME   blood glucose meter kit and supplies Dispense based on patient and insurance preference. Use up to four times daily as directed. (FOR ICD-10 E10.9, E11.9).   carvedilol  3.125 MG tablet Commonly known as: COREG  Take 1 tablet (3.125 mg total) by mouth 2 (two) times daily with a meal. What changed:  medication strength how much to take when to take this   clopidogrel  75 MG tablet Commonly known as: PLAVIX  Take 1 tablet (75 mg total) by mouth daily.   Fe Fum-Vit C-Vit B12-FA Caps capsule Commonly known as: TRIGELS-F FORTE Take 1 capsule by mouth daily after breakfast.   Fiasp  FlexTouch 100 UNIT/ML FlexTouch Pen Generic drug: insulin  aspart Inject 40 Units into the skin daily before supper.   FreeStyle Libre 3 Sensor Misc 2 Devices by Does not apply route as needed. Place 1 sensor on the skin every 14 days. Use to check glucose continuously   hydrALAZINE  50 MG tablet Commonly known as: APRESOLINE  Take 1 tablet (50 mg total) by mouth 3 (three) times daily.   isosorbide  mononitrate 30 MG 24 hr tablet Commonly known as: IMDUR  Take 1 tablet (30 mg total) by mouth daily.   metFORMIN  500 MG tablet Commonly known as: GLUCOPHAGE  Take 1 tablet (500 mg total)  by mouth 2 (two) times daily with a meal.   potassium chloride  SA 20 MEQ tablet Commonly known as: KLOR-CON  M Take 2 tablets (40 mEq total) by mouth daily. Start taking on: October 14, 2023   torsemide  20 MG tablet Commonly known as: DEMADEX  Take 3 tablets (60 mg total) by mouth daily. Start taking on: October 14, 2023   traMADol  50 MG tablet Commonly known as: ULTRAM  Take 1 tablet (50 mg total) by mouth every 6 (six) hours as needed for moderate pain (pain score 4-6).               Durable Medical Equipment  (From admission, onward)  Start     Ordered   10/10/23 1411  For home use only DME 3 n 1  Once        10/10/23 1410   10/10/23 0952  For home use only DME 4 wheeled rolling walker with seat  Once       Question:  Patient needs a walker to treat with the following condition  Answer:  S/P CABG x 4   10/10/23 0952   10/09/23 1555  For home use only DME Walker rolling  Once       Question Answer Comment  Walker: With 5 Inch Wheels   Patient needs a walker to treat with the following condition S/P CABG (coronary artery bypass graft)      10/09/23 1555            Follow-up Information     Kerrin Elspeth BROCKS, MD Follow up on 10/31/2023.   Specialty: Cardiothoracic Surgery Why: Follow up appointment is at 11:45AM. Please get a chest xray at 10:45AM on the second floor of our building. Contact information: [address] [place] [address] [phone]         [facility] Follow up.   Specialty: Home Health Services Why: Home Health Physical Therapy and Occupational Therapy- office will call with visit times Contact information: [address] [place] [address] [phone]         Cleotilde Perkins, DO. Go on 11/02/2023.   Specialty: Family Medicine Why: hospital follow up appt scheduled for 11/02/2023 at 1:30 pm, please call if you are unable to keep appt. Contact information: [address] [place]  [address] [phone]         [facility] Follow up on 10/24/2023.   Specialty: Cardiology Why: Follow up in the Advanced Heart Failure clinic 7/1 at 1:30 Entrance C, free valet Contact information: [address] [place]  [address] [phone]                Signed:  Con GORMAN Bend, PA-C  10/13/2023, 12:30 PM

## 2023-09-28 NOTE — TOC Initial Note (Signed)
 Transition of Care [facility]) - Initial/Assessment Note    Patient Details  Name: [name] MRN: [id number] Date of Birth: Feb 13, 1951  [facility]) CM/SW Contact:    Cosimo Diones, RN Phone Number: 09/28/2023, 1:34 PM  Clinical Narrative: Patient presented for chest pain-POD-1 CABG. Patient transferred from 4 E. PTA patient was from home with spouse. Patient may benefit from PT/OT consult for recommendations. Case Manager will continue to follow for transition of care needs as the patient progresses.       Expected Discharge Plan: Home w Home Health Services Barriers to Discharge: Continued Medical Work up  Expected Discharge Plan and Services   Discharge Planning Services: CM Consult   Living arrangements for the past 2 months: Single Family Home  Prior Living Arrangements/Services Living arrangements for the past 2 months: Single Family Home Lives with:: Spouse Patient language and need for interpreter reviewed:: Yes        Need for Family Participation in Patient Care: Yes (Comment) Care giver support system in place?: Yes (comment)   Criminal Activity/Legal Involvement Pertinent to Current Situation/Hospitalization: No - Comment as needed  Permission Sought/Granted Permission sought to share information with : Case Manager, Family Supports   Emotional Assessment Appearance:: Appears stated age   Alcohol / Substance Use: Not Applicable Psych Involvement: No (comment)  Admission diagnosis:  NSTEMI (non-ST elevated myocardial infarction) (HCC) [I21.4] S/P CABG x 4 [Z95.1] Patient Active Problem List   Diagnosis Date Noted   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   Acute on chronic combined systolic and diastolic heart failure (HCC) 09/25/2023   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   Ischemic cardiomyopathy 09/23/2023   Type 2 diabetes mellitus with hyperglycemia, with long-term current use of insulin  (HCC) 09/23/2023   Type 2 diabetes mellitus with hyperlipidemia  (HCC) 09/23/2023   AKI (acute kidney injury) (HCC) 09/23/2023   Benign hypertension with CKD (chronic kidney disease) stage III (HCC) 09/23/2023   Chronic health problem 09/22/2023   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) 09/22/2023   NSTEMI (non-ST elevated myocardial infarction) (HCC) 09/21/2023   Type 2 diabetes mellitus with diabetic chronic kidney disease (HCC) 09/21/2023   CKD (chronic kidney disease) 09/21/2023   Pulmonary nodule 09/21/2023   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) 01/24/2022   Non-cardiac chest pain    Coronary artery disease involving native coronary artery of native heart without angina pectoris    Hypercholesterolemia    Atypical angina (HCC) 01/22/2022   Low-level of literacy 05/21/2018   Globus sensation 03/12/2018   Type 2 diabetes mellitus without complication, with long-term current use of insulin  (HCC) 06/09/2017   Pure hypercholesterolemia 06/09/2017   History of coronary artery stent placement 06/09/2017   Essential hypertension, benign 06/09/2017   PCP:  Clem Currier, DO Pharmacy:   [facility] [place], [place] - [address] [place] [address] Phone: [phone] Fax: [phone]  [facility] [address] [place] [address] Phone: [phone] Fax: [phone]  [id number] - [place], [place] - [address] AT [facility] [address] [facility] [address] [place] [address] Phone: [phone] Fax: [phone]     Social Drivers of Health (SDOH) Social History: SDOH Screenings   Food Insecurity: No Food Insecurity (09/22/2023)  Housing: Low Risk  (09/22/2023)  Transportation Needs: No Transportation Needs (09/22/2023)  Utilities: Not At Risk (09/22/2023)  Alcohol  Screen: Low Risk  (08/21/2023)  Depression (PHQ2-9): Low Risk  (08/21/2023)  Financial Resource  Strain: Low Risk  (08/21/2023)  Physical Activity: Inactive (08/21/2023)  Social Connections: Socially Integrated (09/22/2023)  Stress: No Stress Concern Present (08/21/2023)  Tobacco Use: Medium Risk (09/27/2023)  Health Literacy: Adequate Health Literacy (08/21/2023)   SDOH Interventions:     Readmission Risk Interventions     No data to display

## 2023-09-28 NOTE — Procedures (Signed)
 Extubation Procedure Note  Patient Details:   Name: [name] DOB: [birth year] MRN: [id number]   Airway Documentation:  Airway 8 mm (Active)  Secured at (cm) 24 cm 09/28/23 0816  Measured From Lips 09/28/23 0816  Secured Location Center 09/28/23 0816  Secured By [employer] 09/28/23 0816  Bite Block No 09/28/23 0816  Tube Holder Repositioned Yes 09/28/23 0816  Prone position No 09/28/23 0816  Cuff Pressure (cm H2O) Clear OR 27-39 CmH2O 09/27/23 2009  Site Condition Dry 09/28/23 0816   Vent end date: (not recorded) Vent end time: (not recorded)   Evaluation  O2 sats: stable throughout Complications: No apparent complications Patient did tolerate procedure well. Bilateral Breath Sounds: Diminished, Rhonchi   Yes Pt was successfully extubated with no apparent complications at this time. Audible cuffleak was heard prior extubation with Vte loss noted. Pt currently on 5L [place] and is tolerating well at this time  Lorina Roosevelt 09/28/2023, 9:31 AM

## 2023-09-28 NOTE — Anesthesia Postprocedure Evaluation (Addendum)
 Anesthesia Post Note  Patient: [name]  Procedure(s) Performed: CORONARY ARTERY BYPASS GRAFTING (CABG) TIMES FOUR UTILIZING THE LEFT INTERNAL MAMMARY ARTERY AND ENDOSCOPIC VEIN HARVEST RIGHT GREATER SAPHENOUS VEIN (Chest) ECHOCARDIOGRAM, TRANSESOPHAGEAL, INTRAOPERATIVE     Patient location during evaluation: SICU Anesthesia Type: General Level of consciousness: patient remains intubated per anesthesia plan Pain management: pain level controlled Vital Signs Assessment: post-procedure vital signs reviewed and stable Respiratory status: patient remains intubated per anesthesia plan Cardiovascular status: blood pressure returned to baseline and stable Postop Assessment: no apparent nausea or vomiting Anesthetic complications: no   No notable events documented.  Last Vitals:  Vitals:   09/28/23 1045 09/28/23 1100  BP: 103/71 118/60  Pulse: 88 88  Resp: (!) 21 (!) 21  Temp: 37.1 C 37.1 C  SpO2: 97% 97%    Last Pain:  Vitals:   09/28/23 1118  TempSrc:   PainSc: 3                  Keela Rubert P Kerith Sherley

## 2023-09-28 NOTE — Progress Notes (Addendum)
 Advanced Heart Failure Rounding Note  Cardiologist: Hazle Lites, MD   Chief Complaint: Post cardiotomy shock  Subjective:    06/04: CABG X 4 with Dr. [name]. Difficulty separating from cardiopulmonary bypass   POD #1  Currently on 4 Epi + 0.25 milrinone . Off nicardipine without recurrent hypertension.  Swan #s CO 4.94 CI 2.49 CVP 15 PA 39/21  CO-OX 68% with Fick CI of 3.5 and Hgb 9.6.  Weaning to extubate this am. Wakes up and follows commands.   Objective:   Weight Range: 104.6 kg Body mass index is 39.58 kg/m.   Vital Signs:   Temp:  [90.5 F (32.5 C)-98.8 F (37.1 C)] 98.4 F (36.9 C) (06/05 0800) Pulse Rate:  [72-84] 79 (06/05 0800) Resp:  [9-29] 17 (06/05 0800) BP: (86-115)/(57-75) 109/60 (06/05 0800) SpO2:  [93 %-100 %] 96 % (06/05 0800) Arterial Line BP: (66-180)/(26-85) 106/53 (06/05 0800) FiO2 (%):  [50 %-70 %] 50 % (06/05 0800) Weight:  [104.6 kg] 104.6 kg (06/05 0500) Last BM Date : 09/25/23  Weight change: Filed Weights   09/21/23 0754 09/27/23 0603 09/28/23 0500  Weight: 97.1 kg 93.1 kg 104.6 kg    Intake/Output:   Intake/Output Summary (Last 24 hours) at 09/28/2023 0842 Last data filed at 09/28/2023 0800 Gross per 24 hour  Intake 6245.98 ml  Output 2456 ml  Net 3789.98 ml      Physical Exam    General:  Appears comfortable lying in bed Neck: JVP to jaw Cor: Regular rate & rhythm. No rubs, gallops or murmurs. Lungs: Clear anteriorly Abdomen: Obese, soft, distended.  Extremities: + upper and lower extremity edema. Neuro: Wakes up and follows commands   Telemetry   SR 80s Labs    CBC Recent Labs    09/27/23 2155 09/28/23 0014 09/28/23 0407 09/28/23 0415  WBC 8.8  --   --  8.8  HGB 8.9*   < > 9.2* 9.6*  HCT 28.1*   < > 27.0* 29.9*  MCV 89.5  --   --  90.3  PLT 162  --   --  176   < > = values in this interval not displayed.   Basic Metabolic Panel Recent Labs    82/95/62 0457 09/27/23 0901  09/27/23 2155 09/28/23 0014 09/28/23 0407 09/28/23 0415  NA 140   < > 142   < > 142 140  K 4.2   < > 4.7   < > 4.6 4.4  CL 106   < > 109  --   --  109  CO2 26  --  24  --   --  22  GLUCOSE 102*   < > 150*  --   --  129*  BUN 24*   < > 19  --   --  20  CREATININE 1.89*   < > 1.94*  --   --  2.36*  CALCIUM  9.1  --  8.1*  --   --  7.9*  MG 2.1  --   --   --   --  3.4*   < > = values in this interval not displayed.   Liver Function Tests No results for input(s): "AST", "ALT", "ALKPHOS", "BILITOT", "PROT", "ALBUMIN" in the last 72 hours. No results for input(s): "LIPASE", "AMYLASE" in the last 72 hours. Cardiac Enzymes No results for input(s): "CKTOTAL", "CKMB", "CKMBINDEX", "TROPONINI" in the last 72 hours.  BNP: BNP (last 3 results) Recent Labs    09/21/23 0806  BNP  128.5*    ProBNP (last 3 results) No results for input(s): "PROBNP" in the last 8760 hours.   D-Dimer No results for input(s): "DDIMER" in the last 72 hours. Hemoglobin A1C No results for input(s): "HGBA1C" in the last 72 hours. Fasting Lipid Panel Recent Labs    09/26/23 0351 09/28/23 0415  CHOL 214*  --   HDL 59  --   LDLCALC 121*  --   TRIG 169* 66  CHOLHDL 3.6  --    Thyroid  Function Tests No results for input(s): "TSH", "T4TOTAL", "T3FREE", "THYROIDAB" in the last 72 hours.  Invalid input(s): "FREET3"  Other results:   Imaging    DG Chest Port 1 View Result Date: 09/28/2023 CLINICAL DATA:  Post coronary bypass. EXAM: PORTABLE CHEST 1 VIEW COMPARISON:  09/27/2023 FINDINGS: Postoperative changes in the mediastinum. Endotracheal tube with tip measuring 3 cm above the carina. Enteric tube with tip coiled over the left upper quadrant consistent with location in the body of the stomach. Right Swan-Ganz catheter with tip over the pulmonary outflow tract. Mediastinal drain and left chest drain. Shallow inspiration. Mild cardiac enlargement. Infiltration or atelectasis in the lung bases, greater on the  left. Probable small pleural effusions. No pneumothorax. Mediastinal contours appear intact. IMPRESSION: 1. Appliances appear in satisfactory position. 2. Shallow inspiration with atelectasis in the lung bases, greater on the left. 3. Small pleural effusions.  Mild cardiac enlargement. Electronically Signed   By: Boyce Byes M.D.   On: 09/28/2023 00:46   DG Chest Port 1 View Result Date: 09/27/2023 CLINICAL DATA:  Status post CABG. EXAM: PORTABLE CHEST 1 VIEW COMPARISON:  Chest radiograph dated 09/22/2023. FINDINGS: Endotracheal tube approximately 4 cm above the carina. Enteric tube makes a loop in the proximal stomach with tip in the body of the stomach. Right IJ Swan-Ganz catheter with tip in the region of the right pulmonary artery. Inferiorly accessed mediastinal drain. There is shallow inspiration and left lung base atelectasis. There is mild vascular congestion. No pneumothorax. No acute osseous pathology. Median sternotomy wires. IMPRESSION: 1. Support apparatus as above. 2. Left lung base atelectasis. Electronically Signed   By: Angus Bark M.D.   On: 09/27/2023 16:44     Medications:     Scheduled Medications:  acetaminophen   1,000 mg Oral Q6H   Or   acetaminophen  (TYLENOL ) oral liquid 160 mg/5 mL  1,000 mg Per Tube Q6H   aspirin  EC  325 mg Oral Daily   Or   aspirin   324 mg Per Tube Daily   atorvastatin  80 mg Oral Daily   bisacodyl  10 mg Oral Daily   Or   bisacodyl  10 mg Rectal Daily   Chlorhexidine Gluconate Cloth  6 each Topical Daily   docusate sodium  200 mg Oral Daily   enoxaparin  (LOVENOX ) injection  40 mg Subcutaneous QHS   furosemide   40 mg Intravenous BID   insulin  aspart  0-24 Units Subcutaneous Q4H   insulin  glargine-yfgn  35 Units Subcutaneous BID   metoCLOPramide (REGLAN) injection  10 mg Intravenous Q6H   mouth rinse  15 mL Mouth Rinse Q2H   [START ON 09/29/2023] pantoprazole   40 mg Oral Daily   pantoprazole  (PROTONIX ) IV  40 mg Intravenous QHS   sodium  chloride flush  3 mL Intravenous Q12H    Infusions:  sodium chloride  10 mL/hr at 09/28/23 0800   sodium chloride      sodium chloride      albumin human 999 mL/hr at 09/28/23 0800  ceFAZolin  (ANCEF ) IV Stopped (09/28/23 0556)   dexmedetomidine  (PRECEDEX ) IV infusion Stopped (09/28/23 0601)   epinephrine  4 mcg/min (09/28/23 0800)   insulin  1.7 Units/hr (09/28/23 0800)   lactated ringers     lactated ringers 20 mL/hr at 09/28/23 0800   milrinone  0.25 mcg/kg/min (09/28/23 0800)   niCARDipine Stopped (09/28/23 0722)   norepinephrine  (LEVOPHED ) Adult infusion Stopped (09/28/23 0558)   phenylephrine  (NEO-SYNEPHRINE) Adult infusion Stopped (09/27/23 1545)   propofol (DIPRIVAN) infusion Stopped (09/28/23 0252)    PRN Medications: sodium chloride , albumin human, dextrose , metoprolol tartrate, midazolam , morphine  injection, ondansetron  (ZOFRAN ) IV, mouth rinse, mouth rinse, oxyCODONE , sodium chloride  flush, traMADol    Patient Profile   73y.o. male with history of CAD, HFmrEF, uncontrolled DM II, HTN, HLD.   Admitted with NSTEMI and acute on chronic CHF. Underwent CABG X 4 on 09/27/23 c/b postcardiotomy shock.  Assessment/Plan   Postcardiotomy shock -Echo preCABG: EF 35-40%, RV difficult -LVEDP 38-40 during LHC on 5/29 -Difficulty separating from cardiopulmonary bypass following CABG 06/04 -Currently on 4 Epi (for RV) and 0.25 milrinone . TD CI 2.5 and 3.5 by Fick. Will review timing of Epi wean with Dr. Mitzie Anda. Continue milrinone .  -Needs diuresis especially with plans to extubate today. Start 80 mg lasix  IV BID.   2. NSTEMI/CAD -Hx PCI to LAD in 2005 -NSTEMI 09/21/23. MV CAD on cath. -S/p CABG X 4 (LIMA to LAD, SVG to OM1, sequential to OM3, SVG to PDA) on 09/27/23 -Continue aspirin , eventual plavix -Continue Atorvastatin 80. LDL 121 (not adherent with home meds prior to admit)   3. AKI on CKD IIIa -Baseline Scr around 1.5 -Scr 1.9>2.36 today -Support CO. Watch with starting  diuresis.   4. ABLA - 1 u RBCs in OR - Hgb stable 9.6 today  5. DM II - Uncontrolled, A1c 11.3% - Insulin  per TCTS   Length of Stay: 7  FINCH, LINDSAY N, PA-C  09/28/2023, 8:42 AM  Advanced Heart Failure Team Pager [phone] (M-F; 7a - 5p)  Please contact [facility] for night-coverage after hours (5p -7a ) and weekends on [email or web address]   Patient seen with PA, I formulated the plan and agree with the above note.   Co-ox 68% with CI 2.3, CVP 15-16 on my read.  He is on epinephrine  3 and milrinone  0.25. Creatinine up to 2.36.   Extubated, chest tubes out today, complains of pain.    General: Uncomfortable.  Neck: JVP 12-14 cm, no thyromegaly or thyroid  nodule.  Lungs: Clear to auscultation bilaterally with normal respiratory effort. CV: Nondisplaced PMI.  Heart regular S1/S2, no S3/S4, no murmur.  No peripheral edema.    Abdomen: Soft, nontender, no hepatosplenomegaly, no distention.  Skin: Intact without lesions or rashes.  Neurologic: Alert and oriented x 3.  Psych: Normal affect. Extremities: No clubbing or cyanosis.  HEENT: Normal.   Volume overloaded, will start diuresis today with Lasix  80 mg IV bid.  Would keep epinephrine  at 3 and milrinone  at 0.25 today while diuresing, especially given rise in creatinine from yesterday to maintain CO/RV function.  Follow creatinine closely.   CRITICAL CARE Performed by: Peder Bourdon  Total critical care time: 35 minutes  Critical care time was exclusive of separately billable procedures and treating other patients.  Critical care was necessary to treat or prevent imminent or life-threatening deterioration.  Critical care was time spent personally by me on the following activities: development of treatment plan with patient and/or surrogate as well as nursing, discussions with consultants, evaluation of patient's response  to treatment, examination of patient, obtaining history from patient or surrogate, ordering and performing  treatments and interventions, ordering and review of laboratory studies, ordering and review of radiographic studies, pulse oximetry and re-evaluation of patient's condition.  Peder Bourdon 09/28/2023 11:50 AM

## 2023-09-28 NOTE — Progress Notes (Signed)
 Patient ID: [name], male   DOB: 05/16/1950, 73 y.o.   MRN: [id number] TCTS Evening Rounds:  Hemodynamically stable on milrinone  0.25 and epi 3. CI 2.4.  Did not diurese with 160 mg IV lasix  today. Creat up to 2.87. UO marginal but still making some.  BMET    Component Value Date/Time   NA 140 09/28/2023 1559   NA 140 07/24/2023 1049   K 4.9 09/28/2023 1559   CL 103 09/28/2023 1559   CO2 21 (L) 09/28/2023 1559   GLUCOSE 163 (H) 09/28/2023 1559   BUN 24 (H) 09/28/2023 1559   BUN 22 07/24/2023 1049   CREATININE 2.87 (H) 09/28/2023 1559   CALCIUM  8.1 (L) 09/28/2023 1559   EGFR 43 (L) 07/24/2023 1049   GFRNONAA 22 (L) 09/28/2023 1559   CBC    Component Value Date/Time   WBC 12.0 (H) 09/28/2023 1559   RBC 3.35 (L) 09/28/2023 1559   HGB 9.6 (L) 09/28/2023 1559   HGB 13.0 07/24/2023 1049   HCT 30.2 (L) 09/28/2023 1559   HCT 39.6 07/24/2023 1049   PLT 165 09/28/2023 1559   PLT 280 07/24/2023 1049   MCV 90.1 09/28/2023 1559   MCV 88 07/24/2023 1049   MCH 28.7 09/28/2023 1559   MCHC 31.8 09/28/2023 1559   RDW 14.3 09/28/2023 1559   RDW 11.6 07/24/2023 1049   LYMPHSABS 2.3 01/22/2022 1556   MONOABS 0.6 01/22/2022 1556   EOSABS 0.2 01/22/2022 1556   BASOSABS 0.1 01/22/2022 1556   Continue present level of support and expect kidneys to take a while to recover with stage 3b CKD preop with baseline creat 1.8-2.

## 2023-09-28 NOTE — Consult Note (Signed)
 NAME:  [name], MRN:  [id number], DOB:  Jun 15, 1950, LOS: 7 ADMISSION DATE:  09/21/2023, CONSULTATION DATE: 09/28/2023 REFERRING MD: Dr. [name], CHIEF COMPLAINT: Status post CABG x 4  History of Present Illness:  73 year old male with coronary artery disease, diabetes type 2, hypertension, CKD stage IIIa, hyperlipidemia and chronic HFrEF who initially presented about a week ago with epigastric pain radiating to the chest, was admitted with acute NSTEMI, he underwent cardiac cath which showed severe multivessel coronary artery disease with elevated LVEDP.  Patient underwent CABG x 4, he had decreased urine output post CABG, he was not extubated per rapid weaning protocol, remain on vasopressor support with Levophed , epinephrine  and milrinone .  PCCM was consulted for help evaluation medical management  During my evaluation, patient is awake, following commands, tolerating spontaneous breathing trial  Pertinent  Medical History   Past Medical History:  Diagnosis Date   CAD (coronary artery disease)    Diabetes mellitus without complication (HCC)    Hypercholesterolemia    Hypertension    MI (myocardial infarction) (HCC)    Renal insufficiency     Significant Hospital Events: Including procedures, antibiotic start and stop dates in addition to other pertinent events   5/29 admitted with acute NSTEMI 6/4 CABG x 4 6/5 consulted  Interim History / Subjective:  As above  Objective    Blood pressure 129/64, pulse 84, temperature 98.6 F (37 C), resp. rate 18, height 5\' 4"  (1.626 m), weight 104.6 kg, SpO2 95%. PAP: (19-65)/(4-36) 40/24 CVP:  [7 mmHg-46 mmHg] 17 mmHg PCWP:  [17 mmHg-18 mmHg] 18 mmHg CO:  [3.1 L/min-4.9 L/min] 4.9 L/min CI:  [1.6 L/min/m2-2.5 L/min/m2] 2.5 L/min/m2  Vent Mode: PSV;CPAP FiO2 (%):  [40 %-70 %] 40 % Set Rate:  [16 bmp] 16 bmp Vt Set:  [470 mL] 470 mL PEEP:  [5 cmH20] 5 cmH20 Pressure Support:  [10 cmH20] 10 cmH20 Plateau Pressure:  [19 cmH20-21  cmH20] 21 cmH20   Intake/Output Summary (Last 24 hours) at 09/28/2023 0915 Last data filed at 09/28/2023 0900 Gross per 24 hour  Intake 6298.65 ml  Output 2376 ml  Net 3922.65 ml   Filed Weights   09/21/23 0754 09/27/23 0603 09/28/23 0500  Weight: 97.1 kg 93.1 kg 104.6 kg    Examination: General: Crtitically ill-appearing male, orally intubated HEENT: [place]/AT, eyes anicteric.  ETT and cortrak in place Neuro: Opens eyes with vocal stimuli, following simple commands, having bilateral tremors of upper extremities Chest: Central sternotomy incision looks clean and dry, reduced air entry at the bases bilaterally, no wheezes or rhonchi.  Pacer wire, mediastinal and chest tubes in place Heart: Regular rate and rhythm, no murmurs or gallops Abdomen: Soft, nondistended, bowel sounds present   Labs and images reviewed  Resolved problem list   Assessment and Plan  Multivessel coronary artery disease, presented with acute NSTEMI s/p CABG x 4 Continue aspirin  and statin Patient will be loaded with Plavix after pacer wires are removed Chest tube management TCTS Chest tube output was 256 is an 18 hours postprocedure Off Precedex  Continue pain control with tramadol, oxycodone  and morphine  Closely monitor chest tube output  Acute respiratory insufficiency, postop Continue on protective ventilation VAP prevention bundle in place Tolerating spontaneous breathing trial, will try to extubate him  Post cardiotomy shock Patient required norepinephrine , epinephrine  and milrinone  yesterday Levophed  was titrated off just now Advanced heart failure team and TCTS following  Chronic HFrEF Monitor intake and output Echocardiogram showed EF 35 to 40% GDMT once able to  tolerate  AKI on CKD stage IIIa Patient baseline serum creatinine is around 1.7 Serum creatinine has trended up to 2.36 Plan to diurese, monitor intake and output Avoid nephrotoxic agent  Hypertension Holding antihypertensive for  now, as patient is requiring vasopressors and inotropes  Hyperlipidemia Continue atorvastatin  Poorly controlled diabetes type 2 with hyperglycemia Patient hemoglobin A1c is 11.3 Transition insulin  infusion to long-acting and sliding scale insulin  with CBG goal 140-180  Expected perioperative blood loss anemia Monitor H/H and PLT counts   Best Practice (right click and "Reselect all SmartList Selections" daily)   Diet/type: NPO DVT prophylaxis: SCD GI prophylaxis: PPI Lines: Central line, Arterial Line, and yes and it is still needed Foley:  Yes, and it is still needed Code Status:  full code Last date of multidisciplinary goals of care discussion [Per primary team]   Labs   CBC: Recent Labs  Lab 09/26/23 0351 09/27/23 0457 09/27/23 0901 09/27/23 1234 09/27/23 1238 09/27/23 1604 09/27/23 1606 09/27/23 2155 09/28/23 0014 09/28/23 0407 09/28/23 0415  WBC 7.1 8.1  --   --   --  10.0  --  8.8  --   --  8.8  HGB 11.9* 12.2*   < > 6.8*   < > 9.7* 9.5* 8.9* 8.2* 9.2* 9.6*  HCT 37.9* 38.5*   < > 21.4*   < > 30.7* 28.0* 28.1* 24.0* 27.0* 29.9*  MCV 90.2 90.4  --   --   --  90.0  --  89.5  --   --  90.3  PLT 270 280  --  148*  --  154  --  162  --   --  176   < > = values in this interval not displayed.    Basic Metabolic Panel: Recent Labs  Lab 09/23/23 0353 09/24/23 0357 09/25/23 0420 09/26/23 0841 09/27/23 0457 09/27/23 0901 09/27/23 1347 09/27/23 1424 09/27/23 1459 09/27/23 1504 09/27/23 1606 09/27/23 2155 09/28/23 0014 09/28/23 0407 09/28/23 0415  NA 139 140 140 140 140   < > 141 141   < > 140 142 142 142 142 140  K 3.3* 3.6 4.5 4.3 4.2   < > 5.7* 5.2*   < > 4.8 4.5 4.7 4.3 4.6 4.4  CL 103 104 108 106 106   < > 107 106  --  107  --  109  --   --  109  CO2 25 26 23 25 26   --   --   --   --   --   --  24  --   --  22  GLUCOSE 173* 144* 166* 221* 102*   < > 108* 148*  --  144*  --  150*  --   --  129*  BUN 30* 37* 31* 25* 24*   < > 21 21  --  20  --  19   --   --  20  CREATININE 1.98* 2.21* 2.04* 1.85* 1.89*   < > 1.70* 1.60*  --  1.70*  --  1.94*  --   --  2.36*  CALCIUM  8.4* 8.9 8.4* 8.9 9.1  --   --   --   --   --   --  8.1*  --   --  7.9*  MG 2.3 2.4 2.4  --  2.1  --   --   --   --   --   --   --   --   --  3.4*   < > =  values in this interval not displayed.   GFR: Estimated Creatinine Clearance: 30.5 mL/min (A) (by C-G formula based on SCr of 2.36 mg/dL (H)). Recent Labs  Lab 09/27/23 0457 09/27/23 1604 09/27/23 2155 09/28/23 0415  WBC 8.1 10.0 8.8 8.8    Liver Function Tests: No results for input(s): "AST", "ALT", "ALKPHOS", "BILITOT", "PROT", "ALBUMIN" in the last 168 hours. No results for input(s): "LIPASE", "AMYLASE" in the last 168 hours. No results for input(s): "AMMONIA" in the last 168 hours.  ABG    Component Value Date/Time   PHART 7.336 (L) 09/28/2023 0407   PCO2ART 39.7 09/28/2023 0407   PO2ART 73 (L) 09/28/2023 0407   HCO3 21.2 09/28/2023 0407   TCO2 22 09/28/2023 0407   ACIDBASEDEF 4.0 (H) 09/28/2023 0407   O2SAT 67.8 09/28/2023 0553     Coagulation Profile: Recent Labs  Lab 09/27/23 1604  INR 1.3*    Cardiac Enzymes: No results for input(s): "CKTOTAL", "CKMB", "CKMBINDEX", "TROPONINI" in the last 168 hours.  HbA1C: HbA1c, POC (controlled diabetic range)  Date/Time Value Ref Range Status  03/01/2023 10:46 AM 12.9 (A) 0.0 - 7.0 % Final  08/31/2022 10:03 AM 13.1 (A) 0.0 - 7.0 % Final   HbA1c POC (<> result, manual entry)  Date/Time Value Ref Range Status  07/24/2023 11:30 AM >15.0 4.0 - 5.6 % Final   Hgb A1c MFr Bld  Date/Time Value Ref Range Status  09/22/2023 11:30 AM 11.3 (H) 4.8 - 5.6 % Final    Comment:    (NOTE) Diagnosis of Diabetes The following HbA1c ranges recommended by the American Diabetes Association (ADA) may be used as an aid in the diagnosis of diabetes mellitus.  Hemoglobin             Suggested A1C NGSP%              Diagnosis  <5.7                   Non  Diabetic  5.7-6.4                Pre-Diabetic  >6.4                   Diabetic  <7.0                   Glycemic control for                       adults with diabetes.      CBG: Recent Labs  Lab 09/28/23 0212 09/28/23 0405 09/28/23 0614 09/28/23 0801 09/28/23 0902  GLUCAP 122* 143* 131* 165* 138*    Review of Systems:   Unable to obtain as patient is intubated  Past Medical History:  He,  has a past medical history of CAD (coronary artery disease), Diabetes mellitus without complication (HCC), Hypercholesterolemia, Hypertension, MI (myocardial infarction) (HCC), and Renal insufficiency.   Surgical History:   Past Surgical History:  Procedure Laterality Date   CORONARY ANGIOPLASTY WITH STENT PLACEMENT  08/01/2003   Proximal LAD   LEFT HEART CATH AND CORONARY ANGIOGRAPHY N/A 09/21/2023   Procedure: LEFT HEART CATH AND CORONARY ANGIOGRAPHY;  Surgeon: Kyra Phy, MD;  Location: [facility];  Service: Cardiovascular;  Laterality: N/A;     Social History:   reports that he quit smoking about 30 years ago. His smoking use included cigarettes. He started smoking about 40 years ago. He has a 5 pack-year smoking history.  He has never used smokeless tobacco. He reports that he does not drink alcohol and does not use drugs.   Family History:  His family history includes Alcoholism in his father; Diabetes in his brother and mother; Heart disease in his father and mother; Hypercholesterolemia in his brother; Hypertension in his brother, mother, and sister; Kidney disease in his brother; Osteoporosis in his brother.   Allergies Allergies  Allergen Reactions   Trulicity [Dulaglutide] Other (See Comments)    Patient indicated this caused pancreatitis.      Home Medications  Prior to Admission medications   Medication Sig Start Date End Date Taking? Authorizing Provider  aspirin  EC 81 MG tablet Take 81 mg by mouth daily.   Yes [provider]  carvedilol   (COREG ) 25 MG tablet Take 1 tablet (25 mg total) by mouth 2 (two) times daily. Patient taking differently: Take 25 mg by mouth daily. 12/06/22 09/20/24 Yes McDiarmid, Demetra Filter, MD  eplerenone  (INSPRA ) 25 MG tablet Take 1 tablet (25 mg total) by mouth daily. 06/20/23  Yes McDiarmid, Demetra Filter, MD  insulin  aspart (FIASP  FLEXTOUCH) 100 UNIT/ML FlexTouch Pen Inject 40 Units into the skin daily before supper. 07/25/23  Yes McDiarmid, Demetra Filter, MD  Insulin  Glargine (BASAGLAR  KWIKPEN) 100 UNIT/ML DIAL AND INJECT 65 UNITS UNDER THE SKIN DAILY. 08/21/23  Yes Clem Currier, DO  metFORMIN  (GLUCOPHAGE ) 500 MG tablet Take 1 tablet (500 mg total) by mouth 2 (two) times daily with a meal. 12/22/22  Yes McDiarmid, Demetra Filter, MD  nitroGLYCERIN  (NITROSTAT ) 0.3 MG SL tablet Place 1 tablet (0.3 mg total) under the tongue every 5 (five) minutes as needed for chest pain. 08/17/23  Yes Clem Currier, DO  rosuvastatin  (CRESTOR ) 40 MG tablet Take 1 tablet (40 mg total) by mouth daily. 03/14/23  Yes McDiarmid, Demetra Filter, MD  Accu-Chek Softclix Lancets lancets USE AS DIRECTED UP TO FOUR TIMES DAILY Patient not taking: Reported on 09/05/2023 06/10/19   Shirley, [place], DO  BD PEN NEEDLE NANO 2ND GEN 32G X 4 MM MISC USE 3 TIMES DAILY AS NEEDED BETWEEN MEALS AND BEDTIME 01/25/23   Clem Currier, DO  blood glucose meter kit and supplies Dispense based on patient and insurance preference. Use up to four times daily as directed. (FOR ICD-10 E10.9, E11.9). Patient not taking: Reported on 09/05/2023 05/13/20   Lilland, Alana, DO  Continuous Glucose Sensor (FREESTYLE LIBRE 3 SENSOR) MISC 2 Devices by Does not apply route as needed. Place 1 sensor on the skin every 14 days. Use to check glucose continuously 12/22/22   McDiarmid, Demetra Filter, MD  glucose blood (ACCU-CHEK GUIDE) test strip Use as instructed Patient not taking: Reported on 09/05/2023 05/21/20   Anibal Kent, MD     The patient is critically ill due to multivessel coronary artery disease status post  CABG/postcardiotomy shock/acute respiratory insufficiency/AKI on CKD stage IIIa.  Critical care was necessary to treat or prevent imminent or life-threatening deterioration.  Critical care was time spent personally by me on the following activities: development of treatment plan with patient and/or surrogate as well as nursing, discussions with consultants, evaluation of patient's response to treatment, examination of patient, obtaining history from patient or surrogate, ordering and performing treatments and interventions, ordering and review of laboratory studies, ordering and review of radiographic studies, pulse oximetry, re-evaluation of patient's condition and participation in multidisciplinary rounds.   During this encounter critical care time was devoted to patient care services described in this note for 39 minutes.  Trevor Fudge, MD [facility] See Amion for pager If no response to pager, please call [phone] until 7pm After 7pm, Please call E-link [phone]

## 2023-09-29 ENCOUNTER — Inpatient Hospital Stay (HOSPITAL_COMMUNITY)

## 2023-09-29 DIAGNOSIS — K567 Ileus, unspecified: Secondary | ICD-10-CM

## 2023-09-29 DIAGNOSIS — I5023 Acute on chronic systolic (congestive) heart failure: Secondary | ICD-10-CM

## 2023-09-29 DIAGNOSIS — J9601 Acute respiratory failure with hypoxia: Secondary | ICD-10-CM

## 2023-09-29 DIAGNOSIS — N1831 Chronic kidney disease, stage 3a: Secondary | ICD-10-CM | POA: Diagnosis not present

## 2023-09-29 DIAGNOSIS — I214 Non-ST elevation (NSTEMI) myocardial infarction: Secondary | ICD-10-CM | POA: Diagnosis not present

## 2023-09-29 DIAGNOSIS — R570 Cardiogenic shock: Secondary | ICD-10-CM | POA: Diagnosis not present

## 2023-09-29 LAB — ECHO INTRAOPERATIVE TEE
AR max vel: 1.72 cm2
AV Area VTI: 1.75 cm2
AV Area mean vel: 1.75 cm2
AV Mean grad: 3 mmHg
AV Peak grad: 6.2 mmHg
Ao pk vel: 1.24 m/s
Area-P 1/2: 3.7 cm2
Height: 64 in
MV VTI: 2.68 cm2
Weight: 3283.97 [oz_av]

## 2023-09-29 LAB — GLUCOSE, CAPILLARY
Glucose-Capillary: 205 mg/dL — ABNORMAL HIGH (ref 70–99)
Glucose-Capillary: 226 mg/dL — ABNORMAL HIGH (ref 70–99)
Glucose-Capillary: 196 mg/dL — ABNORMAL HIGH (ref 70–99)
Glucose-Capillary: 155 mg/dL — ABNORMAL HIGH (ref 70–99)
Glucose-Capillary: 154 mg/dL — ABNORMAL HIGH (ref 70–99)
Glucose-Capillary: 134 mg/dL — ABNORMAL HIGH (ref 70–99)
Glucose-Capillary: 112 mg/dL — ABNORMAL HIGH (ref 70–99)
Glucose-Capillary: 86 mg/dL (ref 70–99)

## 2023-09-29 LAB — BASIC METABOLIC PANEL WITH GFR
Anion gap: 10 (ref 5–15)
BUN: 28 mg/dL — ABNORMAL HIGH (ref 8–23)
CO2: 22 mmol/L (ref 22–32)
Calcium: 8 mg/dL — ABNORMAL LOW (ref 8.9–10.3)
Chloride: 106 mmol/L (ref 98–111)
Creatinine, Ser: 3.78 mg/dL — ABNORMAL HIGH (ref 0.61–1.24)
GFR, Estimated: 16 mL/min — ABNORMAL LOW (ref >60)
Glucose, Bld: 211 mg/dL — ABNORMAL HIGH (ref 70–99)
Potassium: 5.1 mmol/L (ref 3.5–5.1)
Sodium: 138 mmol/L (ref 135–145)

## 2023-09-29 LAB — COOXEMETRY PANEL
Carboxyhemoglobin: 1.3 % (ref 0.5–1.5)
Methemoglobin: <0.7 % (ref 0.0–1.5)
O2 Saturation: 73.9 %
Total hemoglobin: 10 g/dL — ABNORMAL LOW (ref 12.0–16.0)

## 2023-09-29 LAB — CBC
HCT: 31.1 % — ABNORMAL LOW (ref 39.0–52.0)
Hemoglobin: 9.7 g/dL — ABNORMAL LOW (ref 13.0–17.0)
MCH: 28.8 pg (ref 26.0–34.0)
MCHC: 31.2 g/dL (ref 30.0–36.0)
MCV: 92.3 fL (ref 80.0–100.0)
Platelets: 152 10*3/uL (ref 150–400)
RBC: 3.37 MIL/uL — ABNORMAL LOW (ref 4.22–5.81)
RDW: 14.5 % (ref 11.5–15.5)
WBC: 13.2 10*3/uL — ABNORMAL HIGH (ref 4.0–10.5)
nRBC: 0 % (ref 0.0–0.2)

## 2023-09-29 MED ORDER — MILRINONE LACTATE IN DEXTROSE 20-5 MG/100ML-% IV SOLN
0.1250 ug/kg/min | INTRAVENOUS | Status: DC
  Administered 2023-09-29 – 2023-10-03 (×6): 0.125 ug/kg/min via INTRAVENOUS
  Filled 2023-09-29 (×6): qty 100

## 2023-09-29 MED ORDER — INSULIN ASPART 100 UNIT/ML IJ SOLN
0.0000 [IU] | Freq: Three times a day (TID) | INTRAMUSCULAR | Status: DC
  Administered 2023-09-29 (×2): 3 [IU] via SUBCUTANEOUS

## 2023-09-29 MED ORDER — TRAMADOL HCL 50 MG PO TABS: 50.0000 mg | ORAL_TABLET | Freq: Four times a day (QID) | ORAL | Status: DC | PRN

## 2023-09-29 MED ORDER — FUROSEMIDE 10 MG/ML IJ SOLN
30.0000 mg/h | INTRAVENOUS | Status: DC
  Administered 2023-09-29 (×2): 15 mg/h via INTRAVENOUS
  Administered 2023-09-30 (×2): 30 mg/h via INTRAVENOUS
  Filled 2023-09-29 (×6): qty 20

## 2023-09-29 MED ORDER — INSULIN GLARGINE-YFGN 100 UNIT/ML ~~LOC~~ SOLN
30.0000 [IU] | Freq: Two times a day (BID) | SUBCUTANEOUS | Status: DC
  Administered 2023-09-29 (×2): 30 [IU] via SUBCUTANEOUS
  Filled 2023-09-29 (×4): qty 0.3

## 2023-09-29 MED ORDER — MILRINONE LACTATE IN DEXTROSE 20-5 MG/100ML-% IV SOLN
0.1250 ug/kg/min | INTRAVENOUS | Status: DC
  Administered 2023-09-29: 0.125 ug/kg/min via INTRAVENOUS

## 2023-09-29 MED ORDER — METOCLOPRAMIDE HCL 5 MG/ML IJ SOLN
10.0000 mg | Freq: Four times a day (QID) | INTRAMUSCULAR | Status: AC
  Administered 2023-09-29 – 2023-09-30 (×6): 10 mg via INTRAVENOUS
  Filled 2023-09-29 (×6): qty 2

## 2023-09-29 MED ORDER — FENTANYL CITRATE PF 50 MCG/ML IJ SOSY
12.5000 ug | PREFILLED_SYRINGE | INTRAMUSCULAR | Status: DC | PRN
  Administered 2023-09-29 – 2023-09-30 (×2): 12.5 ug via INTRAVENOUS
  Filled 2023-09-29 (×3): qty 1

## 2023-09-29 MED ORDER — INSULIN GLARGINE-YFGN 100 UNIT/ML ~~LOC~~ SOLN
20.0000 [IU] | Freq: Two times a day (BID) | SUBCUTANEOUS | Status: DC
  Filled 2023-09-29 (×2): qty 0.2

## 2023-09-29 MED ORDER — INSULIN ASPART 100 UNIT/ML IJ SOLN: 0.0000 [IU] | Freq: Every day | INTRAMUSCULAR | Status: DC

## 2023-09-29 NOTE — Progress Notes (Signed)
 2 Days Post-Op Procedure(s) (LRB): CORONARY ARTERY BYPASS GRAFTING (CABG) TIMES FOUR UTILIZING THE LEFT INTERNAL MAMMARY ARTERY AND ENDOSCOPIC VEIN HARVEST RIGHT GREATER SAPHENOUS VEIN (N/A) ECHOCARDIOGRAM, TRANSESOPHAGEAL, INTRAOPERATIVE (N/A) Subjective: Lethargic this AM Oriented and appropriate responses but slow  Objective: Vital signs in last 24 hours: Temp:  [97.9 F (36.6 C)-98.8 F (37.1 C)] 98.2 F (36.8 C) (06/06 0600) Pulse Rate:  [79-150] 110 (06/06 0600) Cardiac Rhythm: Sinus tachycardia (06/06 0000) Resp:  [6-27] 6 (06/06 0600) BP: (91-165)/(50-124) 111/70 (06/06 0600) SpO2:  [87 %-98 %] 93 % (06/06 0615) Arterial Line BP: (96-166)/(46-73) 97/46 (06/05 1230) FiO2 (%):  [40 %-50 %] 40 % (06/05 0816) Weight:  [104.7 kg] 104.7 kg (06/06 0500)  Hemodynamic parameters for last 24 hours: PAP: (26-67)/(11-39) 36/20 CVP:  [0 mmHg-35 mmHg] 14 mmHg CO:  [4.2 L/min-5.7 L/min] 4.6 L/min CI:  [2.1 L/min/m2-2.9 L/min/m2] 2.3 L/min/m2  Intake/Output from previous day: 06/05 0701 - 06/06 0700 In: 914.8 [I.V.:714.8; IV Piggyback:200] Out: 630 [Urine:565; Chest Tube:65] Intake/Output this shift: No intake/output data recorded.  General appearance: cooperative and no distress Neurologic: slow response, no focal motor deficit Heart: regular rate and rhythm Lungs: diminished breath sounds bibasilar Abdomen: distended, nontender, hypoactive BS  Lab Results: Recent Labs    09/28/23 1559 09/29/23 0425  WBC 12.0* 13.2*  HGB 9.6* 9.7*  HCT 30.2* 31.1*  PLT 165 152   BMET:  Recent Labs    09/28/23 1559 09/29/23 0425  NA 140 138  K 4.9 5.1  CL 103 106  CO2 21* 22  GLUCOSE 163* 211*  BUN 24* 28*  CREATININE 2.87* 3.78*  CALCIUM  8.1* 8.0*    PT/INR:  Recent Labs    09/27/23 1604  LABPROT 16.4*  INR 1.3*   ABG    Component Value Date/Time   PHART 7.336 (L) 09/28/2023 0854   HCO3 20.5 09/28/2023 0854   TCO2 22 09/28/2023 0854   ACIDBASEDEF 5.0 (H)  09/28/2023 0854   O2SAT 73.9 09/29/2023 0425   CBG (last 3)  Recent Labs    09/28/23 2343 09/29/23 0358 09/29/23 0624  GLUCAP 174* 205* 226*    Assessment/Plan: S/P Procedure(s) (LRB): CORONARY ARTERY BYPASS GRAFTING (CABG) TIMES FOUR UTILIZING THE LEFT INTERNAL MAMMARY ARTERY AND ENDOSCOPIC VEIN HARVEST RIGHT GREATER SAPHENOUS VEIN (N/A) ECHOCARDIOGRAM, TRANSESOPHAGEAL, INTRAOPERATIVE (N/A) POD # 2 NEURO- slow response and lethargic but arouses easily and appropriate  Likely some component of TME CV- in Sr, good hemodynamics on milrinone  and epi  Co-ox 74  Decrease milrinone  to 0.125  ASA, add plavix   Statin  Dc pacing wires RESP- severe bibasilar atelectasis  IS, some is due to abdominal distention RENAL- creatinine up to 3.78, baseline 2  Lytes Ok  K 5.1- monitor  AKI in setting of CKD ENDO- CBG elevated  Will continue Semglee  + SSI GI- hypoactive BS  C/w ileus    LOS: 8 days    [name] 09/29/2023

## 2023-09-29 NOTE — Progress Notes (Addendum)
 Patient ID: [name], male   DOB: Nov 06, 1950, 73 y.o.   MRN: [id number]     Advanced Heart Failure Rounding Note  Cardiologist: Hazle Lites, MD   Chief Complaint: Post cardiotomy shock  Subjective:    06/04: CABG X 4 with Dr. [name]. Difficulty separating from cardiopulmonary bypass 06/05: Extubated   POD #2  He is now on epinephrine  3, milrinone  0.125 (decreased this morning).  Poor UOP with IV Lasix , 565 cc last 24 hrs. Creatinine up to 3.78.   [place]wan #s RA 21 PA 53/17 CI 2.98 Co-ox 74%  Lethargic but awake and follows commands.    Objective:   Weight Range: 104.7 kg Body mass index is 39.62 kg/m.   Vital Signs:   Temp:  [97.9 F (36.6 C)-98.8 F (37.1 C)] 97.9 F (36.6 C) (06/06 0800) Pulse Rate:  [84-115] 105 (06/06 0845) Resp:  [6-26] 14 (06/06 0845) BP: (91-165)/(50-124) 131/72 (06/06 0845) SpO2:  [87 %-98 %] 93 % (06/06 0845) Arterial Line BP: (96-144)/(46-64) 97/46 (06/05 1230) Weight:  [104.7 kg] 104.7 kg (06/06 0500) Last BM Date : 09/25/23  Weight change: Filed Weights   09/27/23 0603 09/28/23 0500 09/29/23 0500  Weight: 93.1 kg 104.6 kg 104.7 kg    Intake/Output:   Intake/Output Summary (Last 24 hours) at 09/29/2023 1050 Last data filed at 09/29/2023 0800 Gross per 24 hour  Intake 884.01 ml  Output 535 ml  Net 349.01 ml      Physical Exam    General: NAD Neck: JVP 16+, no thyromegaly or thyroid  nodule.  Lungs: Clear to auscultation bilaterally with normal respiratory effort. CV: Nondisplaced PMI.  Heart regular S1/S2, no S3/S4, no murmur.  1+ edema to thighs.  Abdomen: Soft, nontender, no hepatosplenomegaly, mild distention.  Skin: Intact without lesions or rashes.  Neurologic: Lethargic but follows commands.  Extremities: No clubbing or cyanosis.  HEENT: Normal.   Telemetry   NSR 80s (personally reviewed)  Labs    CBC Recent Labs    09/28/23 1559 09/29/23 0425  WBC 12.0* 13.2*  HGB 9.6* 9.7*  HCT 30.2* 31.1*  MCV  90.1 92.3  PLT 165 152   Basic Metabolic Panel Recent Labs    69/62/95 0415 09/28/23 0854 09/28/23 1559 09/29/23 0425  NA 140   < > 140 138  K 4.4   < > 4.9 5.1  CL 109  --  103 106  CO2 22  --  21* 22  GLUCOSE 129*  --  163* 211*  BUN 20  --  24* 28*  CREATININE 2.36*  --  2.87* 3.78*  CALCIUM  7.9*  --  8.1* 8.0*  MG 3.4*  --  3.2*  --    < > = values in this interval not displayed.   Liver Function Tests No results for input(s): "AST", "ALT", "ALKPHOS", "BILITOT", "PROT", "ALBUMIN" in the last 72 hours. No results for input(s): "LIPASE", "AMYLASE" in the last 72 hours. Cardiac Enzymes No results for input(s): "CKTOTAL", "CKMB", "CKMBINDEX", "TROPONINI" in the last 72 hours.  BNP: BNP (last 3 results) Recent Labs    09/21/23 0806  BNP 128.5*    ProBNP (last 3 results) No results for input(s): "PROBNP" in the last 8760 hours.   D-Dimer No results for input(s): "DDIMER" in the last 72 hours. Hemoglobin A1C No results for input(s): "HGBA1C" in the last 72 hours. Fasting Lipid Panel Recent Labs    09/28/23 0415  TRIG 66   Thyroid  Function Tests No results for input(s): "TSH", "  T4TOTAL", "T3FREE", "THYROIDAB" in the last 72 hours.  Invalid input(s): "FREET3"  Other results:   Imaging    DG Chest Port 1 View Result Date: 09/29/2023 CLINICAL DATA:  Status post CABG EXAM: PORTABLE CHEST 1 VIEW COMPARISON:  September 28, 2023 FINDINGS: Extubated, nasogastric tube removed. Mediastinal drains and chest tubes removed Tip of the [place] in the right main pulmonary artery. Persistent and bilateral basilar atelectasis minimal interstitial prominence exaggerated by the limited inspiratory volume. Heart and mediastinum normal without evidence of pneumothorax IMPRESSION: Extubated, nasogastric tube removed. Mediastinal drains and chest tubes removed. Tip of the [place] in the right main pulmonary artery. Electronically Signed   By: Fredrich Jefferson M.D.   On: 09/29/2023 08:14      Medications:     Scheduled Medications:  acetaminophen   1,000 mg Oral Q6H   Or   acetaminophen  (TYLENOL ) oral liquid 160 mg/5 mL  1,000 mg Per Tube Q6H   aspirin  EC  325 mg Oral Daily   Or   aspirin   324 mg Per Tube Daily   atorvastatin  80 mg Oral Daily   bisacodyl  10 mg Oral Daily   Or   bisacodyl  10 mg Rectal Daily   Chlorhexidine Gluconate Cloth  6 each Topical Daily   docusate sodium  200 mg Oral Daily   enoxaparin  (LOVENOX ) injection  30 mg Subcutaneous QHS   furosemide   80 mg Intravenous BID   insulin  aspart  0-15 Units Subcutaneous TID WC   insulin  aspart  0-5 Units Subcutaneous QHS   insulin  glargine-yfgn  30 Units Subcutaneous BID   metoCLOPramide (REGLAN) injection  10 mg Intravenous Q6H   pantoprazole   40 mg Oral Daily   sodium chloride  flush  3 mL Intravenous Q12H    Infusions:  albumin human 999 mL/hr at 09/29/23 0800   epinephrine  3 mcg/min (09/29/23 0800)   milrinone  0.125 mcg/kg/min (09/29/23 0800)    PRN Medications: albumin human, dextrose , fentaNYL  (SUBLIMAZE ) injection, ondansetron  (ZOFRAN ) IV, mouth rinse, oxyCODONE , sodium chloride  flush, traMADol    Patient Profile   73y.o. male with history of CAD, HFmrEF, uncontrolled DM II, HTN, HLD.   Admitted with NSTEMI and acute on chronic CHF. Underwent CABG X 4 on 09/27/23 c/b postcardiotomy shock.  Assessment/Plan   1. Acute systolic CHF/Cardiogenic shock: Ischemic cardiomyopathy.  Echo pre-CABG with EF 35-40%, RV hard to visualize. Difficulty separating from cardiopulmonary bypass following CABG 09/27/23.  Currently on epinephrine  3 with milrinone  0.125 (decreased today).  Good thermo CI 2.98 with co-ox 74%. MAP stable  However, he is not diuresing well, creatinine up to 3.78.  He is quite volume overloaded with CVP 21.  - Would continue epinephrine  3 and milrinone  0.125 for RV support for now without further down-titration with AKI + volume overload.  - He has had Lasix  80 mg IV x 1 today,  will start gtt 15 mg/hr.  If he continues to have poor response to diuretics and worsening renal function will need CVVH.  - Will get echo to assess RV and rule out large pericardial effusion.  2. CAD: NSTEMI.  Hx PCI to LAD in 2005.  NSTEMI 09/21/23 with multivessel disease on LHC.  S/p CABG X 4 (LIMA to LAD, SVG to OM1, sequential to OM3, SVG to PDA) on 09/27/23 -Continue aspirin , eventual plavix when pacing wires out.  -Continue Atorvastatin 80. LDL 121 (not adherent with home meds prior to admit) 3. AKI on CKD III: Admission creatinine was 1.9.  Scr 1.9>2.36>3.78 today.  Poor UOP despite IV Lasix  with volume overload.  - Support cardiac output with epinephrine /milrinone . - Needs diuresis with markedly elevated CVP and elevated PA pressure, will try to push Lasix  as above but may need CVVH.  4. Anemia: Post-op, hgb stable at 9.7.  5. DM II: Uncontrolled, A1c 11.3% - Insulin  per TCTS  Discussed patient with Dr. Fulton Job, will consult nephrology.   Length of Stay: 8 CRITICAL CARE Performed by: Peder Bourdon  Total critical care time: 40 minutes  Critical care time was exclusive of separately billable procedures and treating other patients.  Critical care was necessary to treat or prevent imminent or life-threatening deterioration.  Critical care was time spent personally by me on the following activities: development of treatment plan with patient and/or surrogate as well as nursing, discussions with consultants, evaluation of patient's response to treatment, examination of patient, obtaining history from patient or surrogate, ordering and performing treatments and interventions, ordering and review of laboratory studies, ordering and review of radiographic studies, pulse oximetry and re-evaluation of patient's condition.  Peder Bourdon 09/29/2023 10:50 AM

## 2023-09-29 NOTE — Plan of Care (Signed)
  Problem: Metabolic: Goal: Ability to maintain appropriate glucose levels will improve Outcome: Progressing   Problem: Skin Integrity: Goal: Risk for impaired skin integrity will decrease Outcome: Progressing   Problem: Clinical Measurements: Goal: Ability to maintain clinical measurements within normal limits will improve Outcome: Progressing

## 2023-09-29 NOTE — Progress Notes (Signed)
 Cortrak Tube Team Note:  Consult received to place a Cortrak feeding tube. 3 Members of the cortrak team attempted placement with no success. Noted MD questioning possible ileus. On exam abdomen very distended and taught. Pt grabbing abdomen saying it hurts. Discussed plan with Dr.Clark. Plan for NGT placement by RN for decompression. After NGT placed, Cortrak RD placed Bridle at 64 cm.    [name], RD Registered Dietitian  See Amion for more information

## 2023-09-29 NOTE — Progress Notes (Signed)
 Initial Nutrition Assessment  DOCUMENTATION CODES:   Obesity unspecified  INTERVENTION:   Pt now NPO with NG tube to suction  Once ileus improves recommend initiate enteral nutrition via NG transitioning to cortrak tube if appropriate  Recommend: Initiate Vital 1.5 @ 25 ml/hr and increase by 10 ml every 8 hours to goal rate of 55 ml/hr 60 ml ProSrouce TF20 daily At goal provides: 2060 kcal, 109 grams protein, and 1003 ml free water    NUTRITION DIAGNOSIS:   Inadequate oral intake related to altered GI function as evidenced by NPO status.  GOAL:   Patient will meet greater than or equal to 90% of their needs  MONITOR:   I & O's  REASON FOR ASSESSMENT:   Consult Enteral/tube feeding initiation and management, Assessment of nutrition requirement/status  ASSESSMENT:   Pt with PMH of CAD, uncontrolled DM, HTN, CKD stage III, HLD, and chronic CHF who was admitted 5/29 for chest pain/acute NSTEMI.    5/29 - admitted with acute NSTEMI 6/4 - s/p CABG x 4; remained intubated post op  6/5 - extubated  Pt lethargic during visit. Pt extubated but per MD has post-bypass cardioplegia requiring inotrope support. Noted moderate edema in all extremities. Relgan ordered for post op ileus. Per documentation PO intake appears to be mostly 100% of meals until NPO for surgery.   Spoke with pt and his wife. Pt uncomfortable and did not answer any questions.  Per wife they have been together for 55 years. They usually go to sleep 2-3 am and get up between 11-12 pm.  They only eat one meal per day but may have snacks during the day. They always have a sweet snack at night. Dinner is usually a meat, starch, and veggie. Per wife pt has gained some weight recently and she had noticed he had been sleeping more during the day with his chest pain.   Pt seen during cortrak placement which was unsuccessful. Abd noted to be distended and firm. Cortrak RD spoke with MD plan adjusted. No cortrak for now  and 16 F NG tube placed for decompression.    Medications reviewed and include: dulcolax, colace, SSI TID with meals and at bedtime, 30 units semglee  BID, 10 mg reglan every 6 hours, protonix  Albumin Epinephrine  3 mcg Lasix  milrinone   Labs reviewed:  Cr 1.94 -> 2.36 -> 2.87 -> 3.78 BUN 24 -> 28 A1C 11.3 CBG's: 155-226  Current weight: 104.7 kg Admission weight: 97.1 kg  Moderate edema noted on all extremities   UOP 565 ml  Chest tube 65 ml - removed I&O: +400 ml x 24 hour  NUTRITION - FOCUSED PHYSICAL EXAM:  Flowsheet Row Most Recent Value  Orbital Region No depletion  Upper Arm Region No depletion  Thoracic and Lumbar Region No depletion  Buccal Region No depletion  Temple Region No depletion  Clavicle Bone Region No depletion  Clavicle and Acromion Bone Region No depletion  Scapular Bone Region Unable to assess  Dorsal Hand Unable to assess  Patellar Region Unable to assess  Anterior Thigh Region Unable to assess  Posterior Calf Region Unable to assess  Edema (RD Assessment) Moderate  Hair Reviewed  Eyes Unable to assess  Mouth Reviewed  Skin Reviewed  Nails Reviewed       Diet Order:   Diet Order             Diet NPO time specified  Diet effective now  EDUCATION NEEDS:   Education needs have been addressed  Skin:  Skin Assessment: Skin Integrity Issues: Skin Integrity Issues:: Incisions Incisions: R leg, chest  Last BM:  6/2; abd distended  Height:   Ht Readings from Last 1 Encounters:  09/27/23 5\' 4"  (1.626 m)    Weight:   Wt Readings from Last 1 Encounters:  09/29/23 104.7 kg    BMI:  Body mass index is 39.62 kg/m.  Estimated Nutritional Needs:   Kcal:  1900-2100  Protein:  100-115 grams  Fluid:  >1.9 L/day  Randine Butcher., RD, LDN, CNSC See AMiON for contact information

## 2023-09-29 NOTE — Progress Notes (Signed)
 NAME:  [name], MRN:  [id number], DOB:  Jul 26, 1950, LOS: 8 ADMISSION DATE:  09/21/2023, CONSULTATION DATE: 09/28/2023 REFERRING MD: Dr. [name], CHIEF COMPLAINT: Status post CABG x 4  History of Present Illness:  73 year old male with coronary artery disease, diabetes type 2, hypertension, CKD stage IIIa, hyperlipidemia and chronic HFrEF who initially presented about a week ago with epigastric pain radiating to the chest, was admitted with acute NSTEMI, he underwent cardiac cath which showed severe multivessel coronary artery disease with elevated LVEDP.  Patient underwent CABG x 4, he had decreased urine output post CABG, he was not extubated per rapid weaning protocol, remain on vasopressor support with Levophed , epinephrine  and milrinone .  PCCM was consulted for help evaluation medical management  During my evaluation, patient is awake, following commands, tolerating spontaneous breathing trial  Pertinent  Medical History   Past Medical History:  Diagnosis Date   CAD (coronary artery disease)    Diabetes mellitus without complication (HCC)    Hypercholesterolemia    Hypertension    MI (myocardial infarction) (HCC)    Renal insufficiency     Significant Hospital Events: Including procedures, antibiotic start and stop dates in addition to other pertinent events   5/29 admitted with acute NSTEMI 6/4 CABG x 4 6/5 consulted  Interim History / Subjective:  Lethargic this morning, not complaining of pain.  Objective    Blood pressure 111/70, pulse (!) 110, temperature 98.2 F (36.8 C), resp. rate (!) 6, height 5\' 4"  (1.626 m), weight 104.7 kg, SpO2 93%. PAP: (26-67)/(11-39) 36/20 CVP:  [0 mmHg-35 mmHg] 14 mmHg CO:  [4.2 L/min-5.7 L/min] 4.6 L/min CI:  [2.1 L/min/m2-2.9 L/min/m2] 2.3 L/min/m2  Vent Mode: PSV;CPAP FiO2 (%):  [40 %-50 %] 40 % PEEP:  [5 cmH20] 5 cmH20 Pressure Support:  [10 cmH20] 10 cmH20   Intake/Output Summary (Last 24 hours) at 09/29/2023 0754 Last data  filed at 09/29/2023 0600 Gross per 24 hour  Intake 914.81 ml  Output 630 ml  Net 284.81 ml   Filed Weights   09/27/23 0603 09/28/23 0500 09/29/23 0500  Weight: 93.1 kg 104.6 kg 104.7 kg    Examination: General: ill appearing man sitting up in bed in NAD HEENT: [place]/AT, eyes anicteric Neuro: arouses to stimulation, falls back asleep quickly. Answering with 1-word answers.  Chest: sternal dressing intact, c/d/I. Reduced breath sounds in bases, no accessory muscle use, mild tachypnea. Heart: S1S2, tachycardic, reg rhythm. Sinus tach on tele.  Abdomen: distended, tympanitic Extremities: dependent thigh edema, hand edema. GU: foley  CVP 16 PA 42/26 (32) CI 2.3  BUN 28 Cr 3.78 WBC 13.2 H/H 9.7/31.1 Platelets 152  BG 200s  CXR personally reviewed> low lung volumes, possible small effusions, gastric air bubble   Resolved problem list   Assessment and Plan  Multivessel coronary artery disease,  acute NSTEMI s/p CABG x 4 Post-bypass cardioplegia requiring inotropic support -DAPT once pacer wires out; aspirin  and statin for now -cont' swan for hemodynamics monitoring -decrease milrinone  to 0.138mcg dose -pain control per protocol; switch morphine  to fentanyl  -tele monitoring -progress diet and mobility as able -pulmonary hygiene  Post cardiotomy shock -con't swan -weaning milrinone  -needs close volume monitoring with developing edema and renal failure  Acute on chronic HFrEF; EF 35 to 40% -eventually needs GDMT; renal failure and cardiogenic shock limiting at present  AKI on CKD stage IIIa -nephrology consulted -renally dose meds; avoid nephrotoxic meds -strict I/O  H/o Hypertension -hold PTA antihypertensives  Hyperlipidemia -statin  Poorly controlled diabetes type  2 with hyperglycemia- uncontrolled; A1c 11.3 -increase glargine to 30 units BID -con't q4 SSI PRN Goal BG 140-180  Expected perioperative blood loss anemia -tranfsuse for Hb <7 or hemodynamically  significant bleeding  Acute metabolic encephalopathy -pain meds per protocol; adjusted tramadol for renal function. Switch morphine  to fentanyl .   Post-op ileus -con't reglan -ADAT cautiously   Best Practice (right click and "Reselect all SmartList Selections" daily)   Diet/type: clears >ADAT DVT prophylaxis: lovenox  GI prophylaxis: PPI Lines: Central line, Arterial Line, and yes and it is still needed Foley:  Yes, and it is still needed Code Status:  full code Last date of multidisciplinary goals of care discussion [Per primary team]   Labs   CBC: Recent Labs  Lab 09/27/23 1604 09/27/23 1606 09/27/23 2155 09/28/23 0014 09/28/23 0407 09/28/23 0415 09/28/23 0854 09/28/23 1559 09/29/23 0425  WBC 10.0  --  8.8  --   --  8.8  --  12.0* 13.2*  HGB 9.7*   < > 8.9*   < > 9.2* 9.6* 9.5* 9.6* 9.7*  HCT 30.7*   < > 28.1*   < > 27.0* 29.9* 28.0* 30.2* 31.1*  MCV 90.0  --  89.5  --   --  90.3  --  90.1 92.3  PLT 154  --  162  --   --  176  --  165 152   < > = values in this interval not displayed.    Basic Metabolic Panel: Recent Labs  Lab 09/24/23 0357 09/25/23 0420 09/26/23 0841 09/27/23 0457 09/27/23 0901 09/27/23 1504 09/27/23 1606 09/27/23 2155 09/28/23 0014 09/28/23 0407 09/28/23 0415 09/28/23 0854 09/28/23 1559 09/29/23 0425  NA 140 140   < > 140   < > 140   < > 142   < > 142 140 140 140 138  K 3.6 4.5   < > 4.2   < > 4.8   < > 4.7   < > 4.6 4.4 4.6 4.9 5.1  CL 104 108   < > 106   < > 107  --  109  --   --  109  --  103 106  CO2 26 23   < > 26  --   --   --  24  --   --  22  --  21* 22  GLUCOSE 144* 166*   < > 102*   < > 144*  --  150*  --   --  129*  --  163* 211*  BUN 37* 31*   < > 24*   < > 20  --  19  --   --  20  --  24* 28*  CREATININE 2.21* 2.04*   < > 1.89*   < > 1.70*  --  1.94*  --   --  2.36*  --  2.87* 3.78*  CALCIUM  8.9 8.4*   < > 9.1  --   --   --  8.1*  --   --  7.9*  --  8.1* 8.0*  MG 2.4 2.4  --  2.1  --   --   --   --   --   --  3.4*  --   3.2*  --    < > = values in this interval not displayed.    This patient is critically ill with multiple organ system failure which requires frequent high complexity decision making, assessment, support, evaluation, and titration of therapies. This was completed  through the application of advanced monitoring technologies and extensive interpretation of multiple databases. During this encounter critical care time was devoted to patient care services described in this note for 35 minutes.  [name], DO 09/29/23 8:24 AM  Pulmonary & Critical Care  For contact information, see Amion. If no response to pager, please call PCCM consult pager. After hours, 7PM- 7AM, please call Elink.

## 2023-09-29 NOTE — Consult Note (Signed)
 Reason for Consult: Acute kidney injury on chronic kidney disease stage III Referring Physician: Jaquita Merl, MD (CCM)  [name] is an 73 y.o. male.  HPI:  73 year old man with past medical history significant for hypertension, type 2 diabetes mellitus, dyslipidemia and chronic kidney disease stage IIIa at baseline (creatinine 1.3-1.6).  Presented to the hospital 8 days ago with epigastric pain radiating into his chest and diagnosed with NSTEMI.  He subsequently underwent coronary angiogram on 5/29 that showed severe multivessel disease with elevated LVEDP that was managed with four-vessel CABG on 6/4.  It was noted that he had some difficulty coming off of cardiopulmonary bypass postoperatively.  Nephrology is consulted because of concerns of worsening renal function with creatinine today up to 3.8 from 2.9 yesterday along with decreased urine output.  He has had some fluctuating blood pressures with some sustained hypotension on 09/28/2023.  The patient is encephalopathic and unable to volunteer additional history.  His wife was at bedside and provides some history.  He follows up with Dr. Christianne Cowper at [facility] and was last seen in October 2024.  Past Medical History:  Diagnosis Date   CAD (coronary artery disease)    Diabetes mellitus without complication (HCC)    Hypercholesterolemia    Hypertension    MI (myocardial infarction) (HCC)    Renal insufficiency     Past Surgical History:  Procedure Laterality Date   CORONARY ANGIOPLASTY WITH STENT PLACEMENT  08/01/2003   Proximal LAD   CORONARY ARTERY BYPASS GRAFT N/A 09/27/2023   Procedure: CORONARY ARTERY BYPASS GRAFTING (CABG) TIMES FOUR UTILIZING THE LEFT INTERNAL MAMMARY ARTERY AND ENDOSCOPIC VEIN HARVEST RIGHT GREATER SAPHENOUS VEIN;  Surgeon: [name], MD;  Location: [facility] OR;  Service: Open Heart Surgery;  Laterality: N/A;   INTRAOPERATIVE TRANSESOPHAGEAL ECHOCARDIOGRAM N/A 09/27/2023   Procedure:  ECHOCARDIOGRAM, TRANSESOPHAGEAL, INTRAOPERATIVE;  Surgeon: [name], MD;  Location[facility] OR;  Service: Open Heart Surgery;  Laterality: N/A;   LEFT HEART CATH AND CORONARY ANGIOGRAPHY N/A 09/21/2023   Procedure: LEFT HEART CATH AND CORONARY ANGIOGRAPHY;  Surgeon: Kyra Phy, MD;  Location: [facility] INVASIVE CV LAB;  Service: Cardiovascular;  Laterality: N/A;    Family History  Problem Relation Age of Onset   Heart disease Mother    Diabetes Mother    Hypertension Mother    Alcoholism Father    Heart disease Father    Hypertension Sister    Diabetes Brother    Hypercholesterolemia Brother    Osteoporosis Brother    Hypertension Brother    Kidney disease Brother     Social History:  reports that he quit smoking about 30 years ago. His smoking use included cigarettes. He started smoking about 40 years ago. He has a 5 pack-year smoking history. He has never used smokeless tobacco. He reports that he does not drink alcohol and does not use drugs.  Allergies:  Allergies  Allergen Reactions   Trulicity [Dulaglutide] Other (See Comments)    Patient indicated this caused pancreatitis.     Medications: I have reviewed the patient's current medications. Scheduled:  acetaminophen   1,000 mg Oral Q6H   Or   acetaminophen  (TYLENOL ) oral liquid 160 mg/5 mL  1,000 mg Per Tube Q6H   aspirin  EC  325 mg Oral Daily   Or   aspirin   324 mg Per Tube Daily   atorvastatin  80 mg Oral Daily   bisacodyl  10 mg Oral Daily   Or   bisacodyl  10  mg Rectal Daily   Chlorhexidine Gluconate Cloth  6 each Topical Daily   docusate sodium  200 mg Oral Daily   enoxaparin  (LOVENOX ) injection  30 mg Subcutaneous QHS   insulin  aspart  0-15 Units Subcutaneous TID WC   insulin  aspart  0-5 Units Subcutaneous QHS   insulin  glargine-yfgn  30 Units Subcutaneous BID   metoCLOPramide (REGLAN) injection  10 mg Intravenous Q6H   pantoprazole   40 mg Oral Daily   sodium chloride  flush  3 mL Intravenous Q12H    Continuous:  albumin human 999 mL/hr at 09/29/23 0800   epinephrine  3 mcg/min (09/29/23 0800)   furosemide  (LASIX ) 200 mg in dextrose  5 % 100 mL (2 mg/mL) infusion 15 mg/hr (09/29/23 1159)   milrinone  0.125 mcg/kg/min (09/29/23 0800)      Latest Ref Rng & Units 09/29/2023    4:25 AM 09/28/2023    3:59 PM 09/28/2023    8:54 AM  BMP  Glucose 70 - 99 mg/dL 147  829    BUN 8 - 23 mg/dL 28  24    Creatinine 5.62 - 1.24 mg/dL 1.30  8.65    Sodium 784 - 145 mmol/L 138  140  140   Potassium 3.5 - 5.1 mmol/L 5.1  4.9  4.6   Chloride 98 - 111 mmol/L 106  103    CO2 22 - 32 mmol/L 22  21    Calcium  8.9 - 10.3 mg/dL 8.0  8.1        Latest Ref Rng & Units 09/29/2023    4:25 AM 09/28/2023    3:59 PM 09/28/2023    8:54 AM  CBC  WBC 4.0 - 10.5 K/uL 13.2  12.0    Hemoglobin 13.0 - 17.0 g/dL 9.7  9.6  9.5   Hematocrit 39.0 - 52.0 % 31.1  30.2  28.0   Platelets 150 - 400 K/uL 152  165     Urinalysis    Component Value Date/Time   COLORURINE YELLOW 09/26/2023 2039   APPEARANCEUR HAZY (A) 09/26/2023 2039   APPEARANCEUR Clear 09/15/2022 1448   LABSPEC 1.012 09/26/2023 2039   PHURINE 5.0 09/26/2023 2039   GLUCOSEU 50 (A) 09/26/2023 2039   HGBUR NEGATIVE 09/26/2023 2039   BILIRUBINUR NEGATIVE 09/26/2023 2039   BILIRUBINUR negative 03/01/2023 1050   BILIRUBINUR Negative 09/15/2022 1448   KETONESUR NEGATIVE 09/26/2023 2039   PROTEINUR >=300 (A) 09/26/2023 2039   UROBILINOGEN 0.2 03/01/2023 1050   NITRITE NEGATIVE 09/26/2023 2039   LEUKOCYTESUR NEGATIVE 09/26/2023 2039      DG Chest Port 1 View Result Date: 09/29/2023 CLINICAL DATA:  Status post CABG EXAM: PORTABLE CHEST 1 VIEW COMPARISON:  September 28, 2023 FINDINGS: Extubated, nasogastric tube removed. Mediastinal drains and chest tubes removed Tip of the Swan-Ganz in the right main pulmonary artery. Persistent and bilateral basilar atelectasis minimal interstitial prominence exaggerated by the limited inspiratory volume. Heart and mediastinum normal  without evidence of pneumothorax IMPRESSION: Extubated, nasogastric tube removed. Mediastinal drains and chest tubes removed. Tip of the Swan-Ganz in the right main pulmonary artery. Electronically Signed   By: Fredrich Jefferson M.D.   On: 09/29/2023 08:14   DG Chest Port 1 View Result Date: 09/28/2023 CLINICAL DATA:  Post coronary bypass. EXAM: PORTABLE CHEST 1 VIEW COMPARISON:  09/27/2023 FINDINGS: Postoperative changes in the mediastinum. Endotracheal tube with tip measuring 3 cm above the carina. Enteric tube with tip coiled over the left upper quadrant consistent with location in the body of the stomach. Right Swan-Ganz  catheter with tip over the pulmonary outflow tract. Mediastinal drain and left chest drain. Shallow inspiration. Mild cardiac enlargement. Infiltration or atelectasis in the lung bases, greater on the left. Probable small pleural effusions. No pneumothorax. Mediastinal contours appear intact. IMPRESSION: 1. Appliances appear in satisfactory position. 2. Shallow inspiration with atelectasis in the lung bases, greater on the left. 3. Small pleural effusions.  Mild cardiac enlargement. Electronically Signed   By: Boyce Byes M.D.   On: 09/28/2023 00:46   DG Chest Port 1 View Result Date: 09/27/2023 CLINICAL DATA:  Status post CABG. EXAM: PORTABLE CHEST 1 VIEW COMPARISON:  Chest radiograph dated 09/22/2023. FINDINGS: Endotracheal tube approximately 4 cm above the carina. Enteric tube makes a loop in the proximal stomach with tip in the body of the stomach. Right IJ Swan-Ganz catheter with tip in the region of the right pulmonary artery. Inferiorly accessed mediastinal drain. There is shallow inspiration and left lung base atelectasis. There is mild vascular congestion. No pneumothorax. No acute osseous pathology. Median sternotomy wires. IMPRESSION: 1. Support apparatus as above. 2. Left lung base atelectasis. Electronically Signed   By: Angus Bark M.D.   On: 09/27/2023 16:44    Review  of Systems  Unable to perform ROS: Mental status change   Blood pressure 131/72, pulse (!) 105, temperature 97.9 F (36.6 C), temperature source Core, resp. rate 14, height 5\' 4"  (1.626 m), weight 104.7 kg, SpO2 93%. Physical Exam Vitals and nursing note reviewed.  Constitutional:      Appearance: He is obese. He is ill-appearing.  HENT:     Head: Normocephalic and atraumatic.  Cardiovascular:     Rate and Rhythm: Regular rhythm. Tachycardia present.     Heart sounds: Heart sounds are distant.     Comments: Sternotomy dressing in place, clean/dry Pulmonary:     Effort: Pulmonary effort is normal. No tachypnea or accessory muscle usage.     Breath sounds: Examination of the right-lower field reveals decreased breath sounds. Examination of the left-lower field reveals decreased breath sounds. Decreased breath sounds present.  Chest:     Comments: Sternotomy dressing in place Abdominal:     Palpations: Abdomen is soft.     Tenderness: There is no abdominal tenderness.     Comments: Abdomen moderately distended  Musculoskeletal:     Cervical back: Neck supple.     Right lower leg: Edema present.     Left lower leg: Edema present.     Comments: 1-2+ upper and lower extremity edema noted     Assessment/Plan: 1.  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  He is currently barely nonoliguric and appears to have suffered hemodynamically mediated ischemic ATN.  Ongoing measures for hemodynamic support along with minimization of nephrotoxin exposure.  There is no acute indication for dialysis noted at this time but with rising potassium level and the downtrending of the urine output, high risk for needing renal replacement therapy in the next 24 to 48 hours. Avoid nephrotoxic medications including NSAIDs and iodinated intravenous contrast  exposure unless the latter is absolutely indicated.  Preferred narcotic agents for pain control are hydromorphone, fentanyl , and methadone. Morphine  should not be used. Avoid Baclofen  and avoid oral sodium phosphate and magnesium  citrate based laxatives / bowel preps. Continue strict Input and Output monitoring. Will monitor the patient closely with you and intervene or adjust therapy as indicated by changes in  clinical status/labs. 2.  NSTEMI: With multivessel coronary artery disease and underwent four-vessel CABG 2 days ago.  Postoperative course significant for difficulty with getting him off cardiopulmonary bypass. 3.  Postoperative shock: Previously on norepinephrine , epinephrine  and milrinone .  His norepinephrine  was weaned off and he remains on milrinone  that is being weaned down along with epinephrine  drip. 4.  Anemia: Likely associated with surgical/postoperative blood loss.  No indication at this time for PRBC transfusion and will continue to trend this.    Melodie Spry 09/29/2023, 11:52 AM

## 2023-09-30 ENCOUNTER — Inpatient Hospital Stay (HOSPITAL_COMMUNITY)

## 2023-09-30 DIAGNOSIS — I5023 Acute on chronic systolic (congestive) heart failure: Secondary | ICD-10-CM | POA: Diagnosis not present

## 2023-09-30 DIAGNOSIS — Z4682 Encounter for fitting and adjustment of non-vascular catheter: Secondary | ICD-10-CM | POA: Diagnosis not present

## 2023-09-30 DIAGNOSIS — N179 Acute kidney failure, unspecified: Secondary | ICD-10-CM | POA: Diagnosis not present

## 2023-09-30 DIAGNOSIS — Z452 Encounter for adjustment and management of vascular access device: Secondary | ICD-10-CM | POA: Diagnosis not present

## 2023-09-30 DIAGNOSIS — I5043 Acute on chronic combined systolic (congestive) and diastolic (congestive) heart failure: Secondary | ICD-10-CM

## 2023-09-30 DIAGNOSIS — R570 Cardiogenic shock: Secondary | ICD-10-CM | POA: Diagnosis not present

## 2023-09-30 DIAGNOSIS — I214 Non-ST elevation (NSTEMI) myocardial infarction: Secondary | ICD-10-CM | POA: Diagnosis not present

## 2023-09-30 DIAGNOSIS — K567 Ileus, unspecified: Secondary | ICD-10-CM | POA: Diagnosis not present

## 2023-09-30 DIAGNOSIS — N1831 Chronic kidney disease, stage 3a: Secondary | ICD-10-CM | POA: Diagnosis not present

## 2023-09-30 DIAGNOSIS — R0989 Other specified symptoms and signs involving the circulatory and respiratory systems: Secondary | ICD-10-CM | POA: Diagnosis not present

## 2023-09-30 LAB — COOXEMETRY PANEL
Carboxyhemoglobin: 1.6 % — ABNORMAL HIGH (ref 0.5–1.5)
Carboxyhemoglobin: 1.7 % — ABNORMAL HIGH (ref 0.5–1.5)
Methemoglobin: <0.7 % (ref 0.0–1.5)
Methemoglobin: <0.7 % (ref 0.0–1.5)
O2 Saturation: 81.8 %
O2 Saturation: 68.3 %
Total hemoglobin: 9.8 g/dL — ABNORMAL LOW (ref 12.0–16.0)
Total hemoglobin: 9.9 g/dL — ABNORMAL LOW (ref 12.0–16.0)

## 2023-09-30 LAB — BPAM RBC
Blood Product Expiration Date: 202507052359
Blood Product Expiration Date: 202507052359
Blood Product Expiration Date: 202507052359
Blood Product Expiration Date: 202507052359
ISSUE DATE / TIME: 202506041209
ISSUE DATE / TIME: 202506041209
ISSUE DATE / TIME: 202506041215
ISSUE DATE / TIME: 202506041215
Unit Type and Rh: 5100
Unit Type and Rh: 5100
Unit Type and Rh: 5100
Unit Type and Rh: 5100

## 2023-09-30 LAB — CBC
HCT: 30.8 % — ABNORMAL LOW (ref 39.0–52.0)
HCT: 28.6 % — ABNORMAL LOW (ref 39.0–52.0)
Hemoglobin: 9.3 g/dL — ABNORMAL LOW (ref 13.0–17.0)
Hemoglobin: 8.8 g/dL — ABNORMAL LOW (ref 13.0–17.0)
MCH: 28 pg (ref 26.0–34.0)
MCH: 28.6 pg (ref 26.0–34.0)
MCHC: 30.2 g/dL (ref 30.0–36.0)
MCHC: 30.8 g/dL (ref 30.0–36.0)
MCV: 92.8 fL (ref 80.0–100.0)
MCV: 92.9 fL (ref 80.0–100.0)
Platelets: 165 10*3/uL (ref 150–400)
Platelets: 149 10*3/uL — ABNORMAL LOW (ref 150–400)
RBC: 3.32 MIL/uL — ABNORMAL LOW (ref 4.22–5.81)
RBC: 3.08 MIL/uL — ABNORMAL LOW (ref 4.22–5.81)
RDW: 14.5 % (ref 11.5–15.5)
RDW: 14.5 % (ref 11.5–15.5)
WBC: 12.6 10*3/uL — ABNORMAL HIGH (ref 4.0–10.5)
WBC: 11.8 10*3/uL — ABNORMAL HIGH (ref 4.0–10.5)
nRBC: 0.2 % (ref 0.0–0.2)
nRBC: 0 % (ref 0.0–0.2)

## 2023-09-30 LAB — RENAL FUNCTION PANEL
Albumin: 2.3 g/dL — ABNORMAL LOW (ref 3.5–5.0)
Anion gap: 11 (ref 5–15)
BUN: 36 mg/dL — ABNORMAL HIGH (ref 8–23)
CO2: 21 mmol/L — ABNORMAL LOW (ref 22–32)
Calcium: 8 mg/dL — ABNORMAL LOW (ref 8.9–10.3)
Chloride: 105 mmol/L (ref 98–111)
Creatinine, Ser: 3.94 mg/dL — ABNORMAL HIGH (ref 0.61–1.24)
GFR, Estimated: 15 mL/min — ABNORMAL LOW (ref >60)
Glucose, Bld: 116 mg/dL — ABNORMAL HIGH (ref 70–99)
Phosphorus: 4.4 mg/dL (ref 2.5–4.6)
Potassium: 4 mmol/L (ref 3.5–5.1)
Sodium: 137 mmol/L (ref 135–145)

## 2023-09-30 LAB — TYPE AND SCREEN
ABO/RH(D): O POS
Antibody Screen: NEGATIVE
Unit division: 0
Unit division: 0
Unit division: 0
Unit division: 0

## 2023-09-30 LAB — GLUCOSE, CAPILLARY
Glucose-Capillary: 102 mg/dL — ABNORMAL HIGH (ref 70–99)
Glucose-Capillary: 103 mg/dL — ABNORMAL HIGH (ref 70–99)
Glucose-Capillary: 125 mg/dL — ABNORMAL HIGH (ref 70–99)
Glucose-Capillary: 155 mg/dL — ABNORMAL HIGH (ref 70–99)
Glucose-Capillary: 82 mg/dL (ref 70–99)
Glucose-Capillary: 90 mg/dL (ref 70–99)
Glucose-Capillary: 91 mg/dL (ref 70–99)
Glucose-Capillary: 99 mg/dL (ref 70–99)

## 2023-09-30 LAB — COMPREHENSIVE METABOLIC PANEL WITH GFR
ALT: 5 U/L (ref 0–44)
AST: 31 U/L (ref 15–41)
Albumin: 2.6 g/dL — ABNORMAL LOW (ref 3.5–5.0)
Alkaline Phosphatase: 55 U/L (ref 38–126)
Anion gap: 13 (ref 5–15)
BUN: 38 mg/dL — ABNORMAL HIGH (ref 8–23)
CO2: 20 mmol/L — ABNORMAL LOW (ref 22–32)
Calcium: 7.9 mg/dL — ABNORMAL LOW (ref 8.9–10.3)
Chloride: 104 mmol/L (ref 98–111)
Creatinine, Ser: 4.79 mg/dL — ABNORMAL HIGH (ref 0.61–1.24)
GFR, Estimated: 12 mL/min — ABNORMAL LOW (ref >60)
Glucose, Bld: 95 mg/dL (ref 70–99)
Potassium: 4.5 mmol/L (ref 3.5–5.1)
Sodium: 137 mmol/L (ref 135–145)
Total Bilirubin: 0.6 mg/dL (ref 0.0–1.2)
Total Protein: 6.1 g/dL — ABNORMAL LOW (ref 6.5–8.1)

## 2023-09-30 LAB — MAGNESIUM: Magnesium: 2.9 mg/dL — ABNORMAL HIGH (ref 1.7–2.4)

## 2023-09-30 LAB — ECHOCARDIOGRAM LIMITED
Height: 64 in
Weight: 3753.11 [oz_av]

## 2023-09-30 LAB — SODIUM, URINE, RANDOM: Sodium, Ur: 106 mmol/L

## 2023-09-30 MED ORDER — METHYLNALTREXONE BROMIDE 12 MG/0.6ML ~~LOC~~ SOLN
12.0000 mg | Freq: Once | SUBCUTANEOUS | Status: AC
  Administered 2023-09-30: 12 mg via SUBCUTANEOUS
  Filled 2023-09-30: qty 0.6

## 2023-09-30 MED ORDER — PRISMASOL BGK 4/2.5 32-4-2.5 MEQ/L EC SOLN
Status: DC
Start: 2023-09-30 — End: 2023-10-05

## 2023-09-30 MED ORDER — PRISMASOL BGK 4/2.5 32-4-2.5 MEQ/L EC SOLN: Status: DC

## 2023-09-30 MED ORDER — INSULIN GLARGINE-YFGN 100 UNIT/ML ~~LOC~~ SOLN
28.0000 [IU] | Freq: Two times a day (BID) | SUBCUTANEOUS | Status: DC
  Administered 2023-09-30 – 2023-10-01 (×3): 28 [IU] via SUBCUTANEOUS
  Filled 2023-09-30 (×6): qty 0.28

## 2023-09-30 MED ORDER — ALBUTEROL SULFATE (2.5 MG/3ML) 0.083% IN NEBU: 2.5000 mg | INHALATION_SOLUTION | RESPIRATORY_TRACT | Status: DC | PRN

## 2023-09-30 MED ORDER — HEPARIN SODIUM (PORCINE) 1000 UNIT/ML DIALYSIS
1000.0000 [IU] | INTRAMUSCULAR | Status: DC | PRN
  Administered 2023-10-01: 3000 [IU] via INTRAVENOUS_CENTRAL
  Filled 2023-09-30: qty 4
  Filled 2023-09-30: qty 6

## 2023-09-30 MED ORDER — LEVALBUTEROL HCL 0.63 MG/3ML IN NEBU
0.6300 mg | INHALATION_SOLUTION | RESPIRATORY_TRACT | Status: DC | PRN
  Administered 2023-09-30 – 2023-10-02 (×2): 0.63 mg via RESPIRATORY_TRACT
  Filled 2023-09-30 (×2): qty 3

## 2023-09-30 MED ORDER — TRAMADOL HCL 50 MG PO TABS
50.0000 mg | ORAL_TABLET | Freq: Four times a day (QID) | ORAL | Status: DC | PRN
  Administered 2023-10-01: 50 mg
  Filled 2023-09-30: qty 1

## 2023-09-30 MED ORDER — INSULIN ASPART 100 UNIT/ML IJ SOLN
0.0000 [IU] | INTRAMUSCULAR | Status: DC
  Administered 2023-09-30: 2 [IU] via SUBCUTANEOUS
  Administered 2023-09-30: 3 [IU] via SUBCUTANEOUS
  Administered 2023-10-01 – 2023-10-02 (×4): 2 [IU] via SUBCUTANEOUS
  Administered 2023-10-02: 5 [IU] via SUBCUTANEOUS
  Administered 2023-10-03: 2 [IU] via SUBCUTANEOUS

## 2023-09-30 MED ORDER — ATORVASTATIN CALCIUM 80 MG PO TABS
80.0000 mg | ORAL_TABLET | Freq: Every day | ORAL | Status: DC
  Administered 2023-09-30 – 2023-10-01 (×2): 80 mg
  Filled 2023-09-30 (×2): qty 1

## 2023-09-30 MED ORDER — PERFLUTREN LIPID MICROSPHERE
1.0000 mL | INTRAVENOUS | Status: AC | PRN
  Administered 2023-09-30: 4 mL via INTRAVENOUS

## 2023-09-30 MED ORDER — BISACODYL 10 MG RE SUPP
10.0000 mg | Freq: Once | RECTAL | Status: AC
  Administered 2023-09-30: 10 mg via RECTAL
  Filled 2023-09-30: qty 1

## 2023-09-30 MED ORDER — HEPARIN SODIUM (PORCINE) 5000 UNIT/ML IJ SOLN
5000.0000 [IU] | Freq: Three times a day (TID) | INTRAMUSCULAR | Status: DC
  Administered 2023-09-30 – 2023-10-04 (×11): 5000 [IU] via SUBCUTANEOUS
  Filled 2023-09-30 (×10): qty 1

## 2023-09-30 MED ORDER — SMOG ENEMA
960.0000 mL | Freq: Once | RECTAL | Status: AC
  Administered 2023-09-30: 960 mL via RECTAL
  Filled 2023-09-30: qty 960

## 2023-09-30 MED ORDER — PANTOPRAZOLE SODIUM 40 MG IV SOLR
40.0000 mg | Freq: Every day | INTRAVENOUS | Status: DC
  Administered 2023-09-30 – 2023-10-04 (×5): 40 mg via INTRAVENOUS
  Filled 2023-09-30 (×5): qty 10

## 2023-09-30 MED ORDER — DOCUSATE SODIUM 50 MG/5ML PO LIQD
200.0000 mg | Freq: Every day | ORAL | Status: DC
  Administered 2023-09-30 – 2023-10-01 (×2): 200 mg
  Filled 2023-09-30 (×2): qty 20

## 2023-09-30 MED ORDER — FUROSEMIDE 10 MG/ML IJ SOLN
160.0000 mg | Freq: Once | INTRAVENOUS | Status: AC
  Administered 2023-09-30: 160 mg via INTRAVENOUS
  Filled 2023-09-30: qty 16

## 2023-09-30 MED ORDER — OXYCODONE HCL 5 MG PO TABS
5.0000 mg | ORAL_TABLET | ORAL | Status: DC | PRN
  Administered 2023-10-01: 5 mg
  Filled 2023-09-30: qty 1

## 2023-09-30 NOTE — Progress Notes (Signed)
 PT Cancellation Note  Patient Details Name: [name] MRN: [id number] DOB: [birth year]   Cancelled Treatment:    Reason Eval/Treat Not Completed: Fatigue/lethargy limiting ability to participate;Medical issues which prohibited therapy  RN reports as pt has become more uremic throughout the day, he has become more confused/less responsive. He just had a central line placed and she is preparing to start CRRT. Will continue to follow and proceed with evaluation when medically appropriate.    Gayle Kava, PT Acute Rehabilitation Services  Office [phone]   [place] Leep 09/30/2023, 1:49 PM

## 2023-09-30 NOTE — Progress Notes (Signed)
 [address]       [place] [address]             [phone]                 3 Days Post-Op Procedure(s) (LRB): CORONARY ARTERY BYPASS GRAFTING (CABG) TIMES FOUR UTILIZING THE LEFT INTERNAL MAMMARY ARTERY AND ENDOSCOPIC VEIN HARVEST RIGHT GREATER SAPHENOUS VEIN (N/A) ECHOCARDIOGRAM, TRANSESOPHAGEAL, INTRAOPERATIVE (N/A)   Events: No events overnight 800 of urine on lasix  gtt  _______________________________________________________________ Vitals: BP 95/61   Pulse (!) 105   Temp 99.5 F (37.5 C)   Resp (!) 8   Ht 5\' 4"  (1.626 m)   Wt 106.4 kg   SpO2 96%   BMI 40.26 kg/m  Filed Weights   09/28/23 0500 09/29/23 0500 09/30/23 0500  Weight: 104.6 kg 104.7 kg 106.4 kg     - Neuro: arousable  - Cardiovascular: sinus tach  Drips: epi. 4, milr 0.125, lasix  15 CVP:  [4 mmHg-68 mmHg] 11 mmHg  - Pulm: EWOB    ABG    Component Value Date/Time   PHART 7.336 (L) 09/28/2023 0854   PCO2ART 38.4 09/28/2023 0854   PO2ART 87 09/28/2023 0854   HCO3 20.5 09/28/2023 0854   TCO2 22 09/28/2023 0854   ACIDBASEDEF 5.0 (H) 09/28/2023 0854   O2SAT 81.8 09/30/2023 0516    - Abd: distended, tympanic - Extremity: edematous  .Intake/Output      06/06 0701 06/07 0700 06/07 0701 06/08 0700   I.V. (mL/kg) 493.4 (4.6)    Other     NG/GT 120    IV Piggyback 0    Total Intake(mL/kg) 613.4 (5.8)    Urine (mL/kg/hr) 800 (0.3)    Emesis/NG output 500    Chest Tube     Total Output 1300    Net -686.7            _______________________________________________________________ Labs:    Latest Ref Rng & Units 09/30/2023    5:16 AM 09/29/2023    4:25 AM 09/28/2023    3:59 PM  CBC  WBC 4.0 - 10.5 K/uL 12.6  13.2  12.0   Hemoglobin 13.0 - 17.0 g/dL 9.3  9.7  9.6   Hematocrit 39.0 - 52.0 % 30.8  31.1  30.2   Platelets 150 - 400 K/uL 165  152  165       Latest Ref Rng & Units 09/30/2023    5:16 AM 09/29/2023    4:25 AM 09/28/2023    3:59 PM  CMP  Glucose 70 -  99 mg/dL 95  664  403   BUN 8 - 23 mg/dL 38  28  24   Creatinine 0.61 - 1.24 mg/dL 4.74  2.59  5.63   Sodium 135 - 145 mmol/L 137  138  140   Potassium 3.5 - 5.1 mmol/L 4.5  5.1  4.9   Chloride 98 - 111 mmol/L 104  106  103   CO2 22 - 32 mmol/L 20  22  21    Calcium  8.9 - 10.3 mg/dL 7.9  8.0  8.1   Total Protein 6.5 - 8.1 g/dL 6.1     Total Bilirubin 0.0 - 1.2 mg/dL 0.6     Alkaline Phos 38 - 126 U/L 55     AST 15 - 41 U/L 31     ALT 0 - 44 U/L 5       CXR: -  _______________________________________________________________  Assessment and Plan:  POD 3 s/p CABG, ileus, AKI  Neuro: mental status stable.  Non focal.  Concern for uremic encephalopathy given BUN CV: on epi and mil.  CVP up Pulm: pulm hygiene Renal: creat continues to rise.  Not making much urine on lasix  gtt.  Would like CRRT today. GI: NPO with NG tube in.  Not much out.  May benefit from CRRT as well. Heme: stable ID: afebrile Endo: on SSI Dispo: continue   Janani Chamber O Bernard Slayden 09/30/2023 8:16 AM

## 2023-09-30 NOTE — Progress Notes (Signed)
 Patient ID: [name], male   DOB: 08-08-1950, 73 y.o.   MRN: [id number]     Advanced Heart Failure Rounding Note  Cardiologist: Hazle Lites, MD   Chief Complaint: Post cardiotomy shock  Subjective:    06/04: CABG X 4 with Dr. [name]. Difficulty separating from cardiopulmonary bypass 06/05: Extubated   POD #3  Poor urine output yesterday with CVP 18, belly tight. Creatinine and BUN worsening. Urine output did increase after lasix  gtt was increased to 30/hour after bolus but given worsening mental status will start CRRT. Off Epi this morning, restarted with CRRT.      Objective:   Weight Range: 106.4 kg Body mass index is 40.26 kg/m.   Vital Signs:   Temp:  [98.6 F (37 C)-99.9 F (37.7 C)] 98.8 F (37.1 C) (06/07 1600) Pulse Rate:  [95-125] 95 (06/07 1600) Resp:  [8-24] 19 (06/07 1600) BP: (71-165)/(56-120) 135/60 (06/07 1600) SpO2:  [91 %-97 %] 94 % (06/07 1600) Weight:  [106.4 kg] 106.4 kg (06/07 0500) Last BM Date : 09/25/23  Weight change: Filed Weights   09/28/23 0500 09/29/23 0500 09/30/23 0500  Weight: 104.6 kg 104.7 kg 106.4 kg    Intake/Output:   Intake/Output Summary (Last 24 hours) at 09/30/2023 1643 Last data filed at 09/30/2023 1600 Gross per 24 hour  Intake 784.49 ml  Output 2437 ml  Net -1652.51 ml      Physical Exam    General: chronically ill appearing Lungs: Increased WOB CV: tachycardic, systolic murmur, JVP to the earlobe, CVP 18 Abdomen: moderate distension Skin: Intact without lesions or rashes.  Neurologic: Lethargic but follows commands.  Extremities: No clubbing or cyanosis.  HEENT: Normal.  Labs    CBC Recent Labs    09/30/23 0516 09/30/23 1600  WBC 12.6* 11.8*  HGB 9.3* 8.8*  HCT 30.8* 28.6*  MCV 92.8 92.9  PLT 165 149*   Basic Metabolic Panel Recent Labs    24/40/10 0415 09/28/23 0854 09/28/23 1559 09/29/23 0425 09/30/23 0516  NA 140   < > 140 138 137  K 4.4   < > 4.9 5.1 4.5  CL 109  --  103 106  104  CO2 22  --  21* 22 20*  GLUCOSE 129*  --  163* 211* 95  BUN 20  --  24* 28* 38*  CREATININE 2.36*  --  2.87* 3.78* 4.79*  CALCIUM  7.9*  --  8.1* 8.0* 7.9*  MG 3.4*  --  3.2*  --   --    < > = values in this interval not displayed.   Liver Function Tests Recent Labs    09/30/23 0516  AST 31  ALT 5  ALKPHOS 55  BILITOT 0.6  PROT 6.1*  ALBUMIN  2.6*   No results for input(s): "LIPASE", "AMYLASE" in the last 72 hours. Cardiac Enzymes No results for input(s): "CKTOTAL", "CKMB", "CKMBINDEX", "TROPONINI" in the last 72 hours.  BNP: BNP (last 3 results) Recent Labs    09/21/23 0806  BNP 128.5*    ProBNP (last 3 results) No results for input(s): "PROBNP" in the last 8760 hours.   D-Dimer No results for input(s): "DDIMER" in the last 72 hours. Hemoglobin A1C No results for input(s): "HGBA1C" in the last 72 hours. Fasting Lipid Panel Recent Labs    09/28/23 0415  TRIG 66   Thyroid  Function Tests No results for input(s): "TSH", "T4TOTAL", "T3FREE", "THYROIDAB" in the last 72 hours.  Invalid input(s): "FREET3"  Other results:   Medications:  Scheduled Medications:  acetaminophen   1,000 mg Oral Q6H   Or   acetaminophen  (TYLENOL ) oral liquid 160 mg/5 mL  1,000 mg Per Tube Q6H   aspirin  EC  325 mg Oral Daily   Or   aspirin   324 mg Per Tube Daily   atorvastatin   80 mg Per Tube Daily   bisacodyl   10 mg Oral Daily   Or   bisacodyl   10 mg Rectal Daily   Chlorhexidine  Gluconate Cloth  6 each Topical Daily   docusate  200 mg Per Tube Daily   heparin  injection (subcutaneous)  5,000 Units Subcutaneous Q8H   insulin  aspart  0-15 Units Subcutaneous Q4H   insulin  glargine-yfgn  28 Units Subcutaneous BID   pantoprazole  (PROTONIX ) IV  40 mg Intravenous Daily   sodium chloride  flush  3 mL Intravenous Q12H    Infusions:  albumin  human 999 mL/hr at 09/30/23 1600   epinephrine  4 mcg/min (09/30/23 1600)   furosemide  (LASIX ) 200 mg in dextrose  5 % 100 mL (2 mg/mL)  infusion Stopped (09/30/23 1514)   milrinone  0.125 mcg/kg/min (09/30/23 1600)   prismasol BGK 4/2.5 1,500 mL/hr at 09/30/23 1427   prismasol BGK 4/2.5 400 mL/hr at 09/30/23 1428   prismasol BGK 4/2.5 400 mL/hr at 09/30/23 1427    PRN Medications: albumin  human, dextrose , fentaNYL  (SUBLIMAZE ) injection, heparin , ondansetron  (ZOFRAN ) IV, mouth rinse, oxyCODONE , sodium chloride  flush, traMADol     Patient Profile   73y.o. male with history of CAD, HFmrEF, uncontrolled DM II, HTN, HLD.   Admitted with NSTEMI and acute on chronic CHF. Underwent CABG X 4 on 09/27/23 c/b postcardiotomy shock.  Assessment/Plan   1. Acute systolic CHF/Cardiogenic shock: Ischemic cardiomyopathy.  Echo pre-CABG with EF 35-40%, RV hard to visualize. Difficulty separating from cardiopulmonary bypass following CABG 09/27/23.  Remains on low dose milrinone  0.125, off Epi. Severely volume overloaded, responding to increase in IV lasix  with urine sodium 106, but given concerns for clearance will start CRRT.  - Continue milrinone  0.125 for RV support - CVVH as above  2. CAD: NSTEMI.  Hx PCI to LAD in 2005.  NSTEMI 09/21/23 with multivessel disease on LHC.  S/p CABG X 4 (LIMA to LAD, SVG to OM1, sequential to OM3, SVG to PDA) on 09/27/23 -Continue aspirin , eventual plavix when pacing wires out.  -Continue Atorvastatin  80. LDL 121 (not adherent with home meds prior to admit)  3. AKI on CKD III: Admission creatinine was 1.9.  Creatinine now well above 4, worsening uremic symptoms. Started on CRRT for clearance, though urine output started to pick up, hopeful for renal recovery following.  - Support cardiac output with milrinone . - CVVH  4. Anemia: Post-op, hgb stable at8.8   5. DM II: Uncontrolled, A1c 11.3% - Insulin  per TCTS  Discussed patient with Dr. Fulton Job, Dr. Deloise Ferries. Start CRRT  Length of Stay: 9 CRITICAL CARE Performed by: Lauralee Poll  Total critical care time: 50 minutes  Critical care time was  exclusive of separately billable procedures and treating other patients.  Critical care was necessary to treat or prevent imminent or life-threatening deterioration.  Critical care was time spent personally by me on the following activities: development of treatment plan with patient and/or surrogate as well as nursing, discussions with consultants, evaluation of patient's response to treatment, examination of patient, obtaining history from patient or surrogate, ordering and performing treatments and interventions, ordering and review of laboratory studies, ordering and review of radiographic studies, pulse oximetry and re-evaluation of patient's condition.  Amye Baller  Kathlee Pap 09/30/2023 4:43 PM

## 2023-09-30 NOTE — Procedures (Signed)
 Central Venous Catheter Insertion Procedure Note  [name]  [id number]  03-21-1951  Date:09/30/23  Time:1:39 PM   Provider Performing:Alaila Pillard Evie Hoff   Procedure: Insertion of Non-tunneled Central Venous Catheter(36556)with US  guidance (40981)    Indication(s) Hemodialysis  Consent Risks of the procedure as well as the alternatives and risks of each were explained to the patient and/or caregiver.  Consent for the procedure was obtained and is signed in the bedside chart  Anesthesia Topical only with 1% lidocaine    Timeout Verified patient identification, verified procedure, site/side was marked, verified correct patient position, special equipment/implants available, medications/allergies/relevant history reviewed, required imaging and test results available.  Sterile Technique Maximal sterile technique including full sterile barrier drape, hand hygiene, sterile gown, sterile gloves, mask, hair covering, sterile ultrasound probe cover (if used).  Procedure Description Area of catheter insertion was cleaned with chlorhexidine  and draped in sterile fashion.   With real-time ultrasound guidance a HD catheter was placed into the left internal jugular vein. Guidewire confirmed in compressible vein prior to dilation. Nonpulsatile blood flow and easy flushing noted in all ports.  The catheter was sutured in place and sterile dressing applied.     Complications/Tolerance None; patient tolerated the procedure well. Chest X-ray is ordered to verify placement for internal jugular or subclavian cannulation.  Chest x-ray is not ordered for femoral cannulation.  EBL Minimal  Specimen(s) None  Nicholas Mussel, DO 09/30/23 1:41 PM [facility]  For contact information, see Amion. If no response to pager, please call PCCM consult pager. After hours, 7PM- 7AM, please call Elink.

## 2023-09-30 NOTE — Progress Notes (Signed)
 Echocardiogram 2D Echocardiogram has been performed.  Emmaline Haring Tibor Lemmons RDCS 09/30/2023, 9:16 AM

## 2023-09-30 NOTE — Progress Notes (Signed)
 [name] and her son were updated at bedside this afternoon. All questions answered.   [name], DO 09/30/23 5:22 PM [facility]  For contact information, see Amion. If no response to pager, please call PCCM consult pager. After hours, 7PM- 7AM, please call Elink.

## 2023-09-30 NOTE — Progress Notes (Signed)
 NAME:  [name], MRN:  [id number], DOB:  [birth year], LOS: 9 ADMISSION DATE:  09/21/2023, CONSULTATION DATE: 09/28/2023 REFERRING MD: Dr. [name], CHIEF COMPLAINT: Status post CABG x 4  History of Present Illness:  73 year old male with coronary artery disease, diabetes type 2, hypertension, CKD stage IIIa, hyperlipidemia and chronic HFrEF who initially presented about a week ago with epigastric pain radiating to the chest, was admitted with acute NSTEMI, he underwent cardiac cath which showed severe multivessel coronary artery disease with elevated LVEDP.  Patient underwent CABG x 4, he had decreased urine output post CABG, he was not extubated per rapid weaning protocol, remain on vasopressor support with Levophed , epinephrine  and milrinone .  PCCM was consulted for help evaluation medical management  During my evaluation, patient is awake, following commands, tolerating spontaneous breathing trial  Pertinent  Medical History   Past Medical History:  Diagnosis Date   CAD (coronary artery disease)    Diabetes mellitus without complication (HCC)    Hypercholesterolemia    Hypertension    MI (myocardial infarction) (HCC)    Renal insufficiency     Significant Hospital Events: Including procedures, antibiotic start and stop dates in addition to other pertinent events   5/29 admitted with acute NSTEMI 6/4 CABG x 4 6/5 consulted  Interim History / Subjective:  He denies pain. Tmax 99.9. Remains on 6L [place].   Objective    Blood pressure 90/62, pulse (!) 106, temperature 99.5 F (37.5 C), resp. rate (!) 9, height 5\' 4"  (1.626 m), weight 106.4 kg, SpO2 96%. PAP: (42-53)/(17-26) 53/17 CVP:  [4 mmHg-68 mmHg] 10 mmHg      Intake/Output Summary (Last 24 hours) at 09/30/2023 9528 Last data filed at 09/30/2023 0600 Gross per 24 hour  Intake 613.35 ml  Output 1300 ml  Net -686.65 ml   Filed Weights   09/28/23 0500 09/29/23 0500 09/30/23 0500  Weight: 104.6 kg 104.7 kg 106.4 kg     Examination: General: ill appearing man lying in bed in NAD HEENT: [place]/AT, eyes anicteric Neuro: awake but lethargic, answering questions, oriented to person and place Chest: Sternal dressing intact. S1S2, tachycardic, reg rhythm Heart: S1S2, tachycardic, reg rhythm. Sinus tach on tele.  Abdomen: distended, NGT with mild bilious output Extremities: +peripheral edema GU: foley with clear yellow urine  I/O -690cc, net -3L for admission (questionable)  CVP 17  [place] 82% BUN 38 Cr 4.79 WBC 12.6 H/H  9.3/30.8 Platelets 165  BG 80-150s  CXR personally reviewed> low lung volumes, LIJ CVC in place, pulm edema.   Resolved problem list   Assessment and Plan  Multivessel coronary artery disease,  acute NSTEMI s/p CABG x 4 Post-bypass cardioplegia requiring inotropic support Acute on chronic HFrEF; EF 35 to 40% Post cardiotomy shock -con't milrinone  & epi gtt -starting CRRT to help with volume -DAPT once wires out; con't aspirin  and statin -con't milrinone , epi -con't lasix  gtt, rebolused and dose increased -pain control per protocol-- hasn't needed much with confusion -tele monitoring -pulmonary hygiene -GDMT limited due to renal failure and shock currently  Acute pulmonary edema, acute respiratory failure with hypoxia -con't diuresis; this afternoon starting CRRT -pulmonary hygiene  AKI on CKD stage IIIa, likely ATN -nephrology consulted; starting CRRT this afternoon -con't lasix , can stop once on CRRT -renally dose meds, avoid nephrotoxic meds -strict I/O  H/o Hypertension -con't holding PTA antihypertensives  Hyperlipidemia -con't statin  Poorly- controlled diabetes type 2 with hyperglycemia- controlled; A1c 11.3 -decrease glargine to 28 units BID -SSI q4h PRN -  goal BG <180  Expected perioperative blood loss anemia -transfuse for Hb <7 or hemodynamically significant bleeding  Acute metabolic encephalopathy -pain meds per protocol -CRRT for metabolic  clearance  Post-op ileus, constipation- last BM charted 6/2 -con't reglan  -con't NGT to LIWS -dulcolax, soap suds enema   Best Practice (right click and "Reselect all SmartList Selections" daily)   Diet/type: NGT to LIWS DVT prophylaxis: [place] heparin  GI prophylaxis: PPI Lines: Central line, Arterial Line, and yes and it is still needed Foley:  Yes, and it is still needed Code Status:  full code Last date of multidisciplinary goals of care discussion [Per primary team]   Labs   CBC: Recent Labs  Lab 09/27/23 2155 09/28/23 0014 09/28/23 0415 09/28/23 0854 09/28/23 1559 09/29/23 0425 09/30/23 0516  WBC 8.8  --  8.8  --  12.0* 13.2* 12.6*  HGB 8.9*   < > 9.6* 9.5* 9.6* 9.7* 9.3*  HCT 28.1*   < > 29.9* 28.0* 30.2* 31.1* 30.8*  MCV 89.5  --  90.3  --  90.1 92.3 92.8  PLT 162  --  176  --  165 152 165   < > = values in this interval not displayed.    Basic Metabolic Panel: Recent Labs  Lab 09/24/23 0357 09/25/23 0420 09/26/23 4782 09/27/23 0457 09/27/23 0901 09/27/23 2155 09/28/23 0014 09/28/23 0415 09/28/23 0854 09/28/23 1559 09/29/23 0425 09/30/23 0516  NA 140 140   < > 140   < > 142   < > 140 140 140 138 137  K 3.6 4.5   < > 4.2   < > 4.7   < > 4.4 4.6 4.9 5.1 4.5  CL 104 108   < > 106   < > 109  --  109  --  103 106 104  CO2 26 23   < > 26  --  24  --  22  --  21* 22 20*  GLUCOSE 144* 166*   < > 102*   < > 150*  --  129*  --  163* 211* 95  BUN 37* 31*   < > 24*   < > 19  --  20  --  24* 28* 38*  CREATININE 2.21* 2.04*   < > 1.89*   < > 1.94*  --  2.36*  --  2.87* 3.78* 4.79*  CALCIUM  8.9 8.4*   < > 9.1  --  8.1*  --  7.9*  --  8.1* 8.0* 7.9*  MG 2.4 2.4  --  2.1  --   --   --  3.4*  --  3.2*  --   --    < > = values in this interval not displayed.    This patient is critically ill with multiple organ system failure which requires frequent high complexity decision making, assessment, support, evaluation, and titration of therapies. This was completed through  the application of advanced monitoring technologies and extensive interpretation of multiple databases. During this encounter critical care time was devoted to patient care services described in this note for 40  minutes.  [name], DO 09/30/23 2:43 PM [facility]  For contact information, see Amion. If no response to pager, please call PCCM consult pager. After hours, 7PM- 7AM, please call Elink.

## 2023-09-30 NOTE — Progress Notes (Signed)
 Patient ID: [name], male   DOB: 1951-03-08, 72 y.o.   MRN: [id number] [name] Progress Note   Assessment/ Plan:   1.  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  Developed nonoliguric postoperative acute kidney injury likely from ischemic ATN given difficulties of weaning from cardiopulmonary bypass.  Urine output unimpressive on furosemide  drip however, without any indication for initiation of renal replacement therapy.  Will continue to follow on current diuretics as we assess urine output/labs and clinical status for potential intervention. 2.  NSTEMI: With multivessel coronary artery disease and underwent four-vessel CABG 3 days ago.  Postoperative course significant for difficulty with getting him off cardiopulmonary bypass. 3.  Postoperative shock: Previously on norepinephrine , epinephrine  and milrinone .  His norepinephrine  was weaned off and he remains on milrinone  that is being weaned down along with epinephrine  drip. 4.  Anemia: Likely associated with surgical/postoperative blood loss.  No indication at this time for PRBC transfusion and will continue to trend this. 5.  Postoperative ileus: With significant constipation and plans noted for attempt of enema today.  Subjective:   Without acute clinical events overnight, ileus/constipation persists   Objective:   BP 95/61   Pulse (!) 105   Temp 99.5 F (37.5 C)   Resp (!) 8   Ht 5\' 4"  (1.626 m)   Wt 106.4 kg   SpO2 96%   BMI 40.26 kg/m   Intake/Output Summary (Last 24 hours) at 09/30/2023 9629 Last data filed at 09/30/2023 0600 Gross per 24 hour  Intake 598.33 ml  Output 1300 ml  Net -701.67 ml   Weight change: 1.7 kg  Physical Exam: Gen: Resting comfortably in bed, somnolent CVS: Pulse regular tachycardia, S1 and S2 normal Resp: Poor inspiratory effort with decreased breath sounds over bases Abd: Moderately distended,  firm and without tenderness Ext: 2+ anasarca  Imaging: DG Abd Portable 1V Result Date: 09/29/2023 CLINICAL DATA:  NG tube placement EXAM: PORTABLE ABDOMEN - 1 VIEW COMPARISON:  None Available. FINDINGS: the tip of the nasogastric tube is located within the fundus stomach in good position. IMPRESSION: Nasogastric tube in good position. Electronically Signed   By: Fredrich Jefferson M.D.   On: 09/29/2023 12:23   DG Chest Port 1 View Result Date: 09/29/2023 CLINICAL DATA:  Status post CABG EXAM: PORTABLE CHEST 1 VIEW COMPARISON:  September 28, 2023 FINDINGS: Extubated, nasogastric tube removed. Mediastinal drains and chest tubes removed Tip of the [place] in the right main pulmonary artery. Persistent and bilateral basilar atelectasis minimal interstitial prominence exaggerated by the limited inspiratory volume. Heart and mediastinum normal without evidence of pneumothorax IMPRESSION: Extubated, nasogastric tube removed. Mediastinal drains and chest tubes removed. Tip of the [place] in the right main pulmonary artery. Electronically Signed   By: Fredrich Jefferson M.D.   On: 09/29/2023 08:14    Labs: BMET Recent Labs  Lab 09/26/23 0841 09/27/23 0457 09/27/23 0901 09/27/23 1424 09/27/23 1459 09/27/23 1504 09/27/23 1606 09/27/23 2155 09/28/23 0014 09/28/23 0407 09/28/23 0415 09/28/23 0854 09/28/23 1559 09/29/23 0425 09/30/23 0516  NA 140 140   < > 141   < > 140   < > 142 142 142 140 140 140 138 137  K 4.3 4.2   < > 5.2*   < > 4.8   < > 4.7 4.3 4.6 4.4 4.6 4.9 5.1 4.5  CL 106 106   < > 106  --  107  --  109  --   --  109  --  103 106 104  CO2 25 26  --   --   --   --   --  24  --   --  22  --  21* 22 20*  GLUCOSE 221* 102*   < > 148*  --  144*  --  150*  --   --  129*  --  163* 211* 95  BUN 25* 24*   < > 21  --  20  --  19  --   --  20  --  24* 28* 38*  CREATININE 1.85* 1.89*   < > 1.60*  --  1.70*  --  1.94*  --   --  2.36*  --  2.87* 3.78* 4.79*  CALCIUM  8.9 9.1  --   --   --   --   --  8.1*  --    --  7.9*  --  8.1* 8.0* 7.9*   < > = values in this interval not displayed.   CBC Recent Labs  Lab 09/28/23 0415 09/28/23 0854 09/28/23 1559 09/29/23 0425 09/30/23 0516  WBC 8.8  --  12.0* 13.2* 12.6*  HGB 9.6* 9.5* 9.6* 9.7* 9.3*  HCT 29.9* 28.0* 30.2* 31.1* 30.8*  MCV 90.3  --  90.1 92.3 92.8  PLT 176  --  165 152 165    Medications:     acetaminophen   1,000 mg Oral Q6H   Or   acetaminophen  (TYLENOL ) oral liquid 160 mg/5 mL  1,000 mg Per Tube Q6H   aspirin  EC  325 mg Oral Daily   Or   aspirin   324 mg Per Tube Daily   atorvastatin   80 mg Per Tube Daily   bisacodyl   10 mg Oral Daily   Or   bisacodyl   10 mg Rectal Daily   bisacodyl   10 mg Rectal Once   Chlorhexidine  Gluconate Cloth  6 each Topical Daily   docusate  200 mg Per Tube Daily   enoxaparin  (LOVENOX ) injection  30 mg Subcutaneous QHS   insulin  aspart  0-15 Units Subcutaneous Q4H   insulin  glargine-yfgn  28 Units Subcutaneous BID   metoCLOPramide  (REGLAN ) injection  10 mg Intravenous Q6H   pantoprazole   40 mg Oral Daily   sodium chloride  flush  3 mL Intravenous Q12H    Clevester Dally, MD 09/30/2023, 8:07 AM

## 2023-10-01 ENCOUNTER — Inpatient Hospital Stay (HOSPITAL_COMMUNITY)

## 2023-10-01 DIAGNOSIS — Z452 Encounter for adjustment and management of vascular access device: Secondary | ICD-10-CM | POA: Diagnosis not present

## 2023-10-01 DIAGNOSIS — I214 Non-ST elevation (NSTEMI) myocardial infarction: Secondary | ICD-10-CM | POA: Diagnosis not present

## 2023-10-01 DIAGNOSIS — R570 Cardiogenic shock: Secondary | ICD-10-CM | POA: Diagnosis not present

## 2023-10-01 DIAGNOSIS — I5023 Acute on chronic systolic (congestive) heart failure: Secondary | ICD-10-CM | POA: Diagnosis not present

## 2023-10-01 DIAGNOSIS — R918 Other nonspecific abnormal finding of lung field: Secondary | ICD-10-CM | POA: Diagnosis not present

## 2023-10-01 DIAGNOSIS — Z4682 Encounter for fitting and adjustment of non-vascular catheter: Secondary | ICD-10-CM | POA: Diagnosis not present

## 2023-10-01 DIAGNOSIS — N1831 Chronic kidney disease, stage 3a: Secondary | ICD-10-CM | POA: Diagnosis not present

## 2023-10-01 DIAGNOSIS — Z9889 Other specified postprocedural states: Secondary | ICD-10-CM | POA: Diagnosis not present

## 2023-10-01 LAB — CBC
HCT: 29.4 % — ABNORMAL LOW (ref 39.0–52.0)
Hemoglobin: 9.2 g/dL — ABNORMAL LOW (ref 13.0–17.0)
MCH: 28.7 pg (ref 26.0–34.0)
MCHC: 31.3 g/dL (ref 30.0–36.0)
MCV: 91.6 fL (ref 80.0–100.0)
Platelets: 184 10*3/uL (ref 150–400)
RBC: 3.21 MIL/uL — ABNORMAL LOW (ref 4.22–5.81)
RDW: 14.4 % (ref 11.5–15.5)
WBC: 9.9 10*3/uL (ref 4.0–10.5)
nRBC: 0 % (ref 0.0–0.2)

## 2023-10-01 LAB — MAGNESIUM: Magnesium: 1.5 mg/dL — ABNORMAL LOW (ref 1.7–2.4)

## 2023-10-01 LAB — RENAL FUNCTION PANEL
Albumin: 2.3 g/dL — ABNORMAL LOW (ref 3.5–5.0)
Albumin: 2.1 g/dL — ABNORMAL LOW (ref 3.5–5.0)
Anion gap: 10 (ref 5–15)
Anion gap: 10 (ref 5–15)
BUN: 25 mg/dL — ABNORMAL HIGH (ref 8–23)
BUN: 19 mg/dL (ref 8–23)
CO2: 23 mmol/L (ref 22–32)
CO2: 25 mmol/L (ref 22–32)
Calcium: 7.9 mg/dL — ABNORMAL LOW (ref 8.9–10.3)
Calcium: 7.6 mg/dL — ABNORMAL LOW (ref 8.9–10.3)
Chloride: 105 mmol/L (ref 98–111)
Chloride: 103 mmol/L (ref 98–111)
Creatinine, Ser: 2.78 mg/dL — ABNORMAL HIGH (ref 0.61–1.24)
Creatinine, Ser: 2.22 mg/dL — ABNORMAL HIGH (ref 0.61–1.24)
GFR, Estimated: 23 mL/min — ABNORMAL LOW (ref >60)
GFR, Estimated: 31 mL/min — ABNORMAL LOW (ref >60)
Glucose, Bld: 116 mg/dL — ABNORMAL HIGH (ref 70–99)
Glucose, Bld: 131 mg/dL — ABNORMAL HIGH (ref 70–99)
Phosphorus: 3.7 mg/dL (ref 2.5–4.6)
Phosphorus: 2.4 mg/dL — ABNORMAL LOW (ref 2.5–4.6)
Potassium: 4.2 mmol/L (ref 3.5–5.1)
Potassium: 3.9 mmol/L (ref 3.5–5.1)
Sodium: 138 mmol/L (ref 135–145)
Sodium: 138 mmol/L (ref 135–145)

## 2023-10-01 LAB — COOXEMETRY PANEL
Carboxyhemoglobin: 1.2 % (ref 0.5–1.5)
Methemoglobin: <0.7 % (ref 0.0–1.5)
O2 Saturation: 67.5 %
Total hemoglobin: 9.3 g/dL — ABNORMAL LOW (ref 12.0–16.0)

## 2023-10-01 LAB — GLUCOSE, CAPILLARY
Glucose-Capillary: 107 mg/dL — ABNORMAL HIGH (ref 70–99)
Glucose-Capillary: 113 mg/dL — ABNORMAL HIGH (ref 70–99)
Glucose-Capillary: 121 mg/dL — ABNORMAL HIGH (ref 70–99)
Glucose-Capillary: 132 mg/dL — ABNORMAL HIGH (ref 70–99)
Glucose-Capillary: 150 mg/dL — ABNORMAL HIGH (ref 70–99)
Glucose-Capillary: 91 mg/dL (ref 70–99)

## 2023-10-01 MED ORDER — SODIUM CHLORIDE 0.9 % IV SOLN
500.0000 [IU]/h | INTRAVENOUS | Status: DC
Start: 2023-10-01 — End: 2023-10-04
  Administered 2023-10-01 – 2023-10-03 (×3): 500 [IU]/h via INTRAVENOUS_CENTRAL
  Filled 2023-10-01 (×3): qty 10000

## 2023-10-01 MED ORDER — CLOPIDOGREL BISULFATE 75 MG PO TABS
75.0000 mg | ORAL_TABLET | Freq: Every day | ORAL | Status: DC
  Administered 2023-10-01: 75 mg
  Filled 2023-10-01: qty 1

## 2023-10-01 MED ORDER — MAGNESIUM SULFATE 2 GM/50ML IV SOLN
2.0000 g | Freq: Once | INTRAVENOUS | Status: AC
  Administered 2023-10-01: 2 g via INTRAVENOUS
  Filled 2023-10-01: qty 50

## 2023-10-01 MED ORDER — ASPIRIN 81 MG PO CHEW: 81.0000 mg | CHEWABLE_TABLET | Freq: Every day | ORAL | Status: DC

## 2023-10-01 MED ORDER — ASPIRIN 81 MG PO TBEC: 81.0000 mg | DELAYED_RELEASE_TABLET | Freq: Every day | ORAL | Status: DC

## 2023-10-01 MED ORDER — PHENOL 1.4 % MT LIQD
1.0000 | OROMUCOSAL | Status: DC | PRN
  Administered 2023-10-01: 1 via OROMUCOSAL
  Filled 2023-10-01: qty 177

## 2023-10-01 NOTE — Progress Notes (Signed)

## 2023-10-01 NOTE — Progress Notes (Signed)
 EVENING ROUNDS NOTE :     [address]       [place] [address]             [phone]                 4 Days Post-Op Procedure(s) (LRB): CORONARY ARTERY BYPASS GRAFTING (CABG) TIMES FOUR UTILIZING THE LEFT INTERNAL MAMMARY ARTERY AND ENDOSCOPIC VEIN HARVEST RIGHT GREATER SAPHENOUS VEIN (N/A) ECHOCARDIOGRAM, TRANSESOPHAGEAL, INTRAOPERATIVE (N/A)   Total Length of Stay:  LOS: 10 days  Events:   No events Stable day    BP 106/69   Pulse (!) 102   Temp 98 F (36.7 C) (Oral)   Resp 12   Ht 5\' 4"  (1.626 m)   Wt 98.1 kg   SpO2 95%   BMI 37.12 kg/m   CVP:  [4 mmHg-19 mmHg] 7 mmHg      epinephrine  5 mcg/min (10/01/23 2000)   milrinone  0.125 mcg/kg/min (10/01/23 2000)   prismasol BGK 4/2.5 1,500 mL/hr at 10/01/23 1355   prismasol BGK 4/2.5 400 mL/hr at 10/01/23 1549   prismasol BGK 4/2.5 400 mL/hr at 10/01/23 1549    I/O last 3 completed shifts: In: 1179.9 [I.V.:557.8; VOZDG:644; NG/GT:355; IV Piggyback:50.1] Out: 5240 [Urine:1650; Emesis/NG output:100]      Latest Ref Rng & Units 10/01/2023    5:00 AM 09/30/2023    4:00 PM 09/30/2023    5:16 AM  CBC  WBC 4.0 - 10.5 K/uL 9.9  11.8  12.6   Hemoglobin 13.0 - 17.0 g/dL 9.2  8.8  9.3   Hematocrit 39.0 - 52.0 % 29.4  28.6  30.8   Platelets 150 - 400 K/uL 184  149  165        Latest Ref Rng & Units 10/01/2023    4:04 PM 10/01/2023    5:00 AM 09/30/2023    4:00 PM  BMP  Glucose 70 - 99 mg/dL 034  742  595   BUN 8 - 23 mg/dL 19  25  36   Creatinine 0.61 - 1.24 mg/dL 6.38  7.56  4.33   Sodium 135 - 145 mmol/L 138  138  137   Potassium 3.5 - 5.1 mmol/L 3.9  4.2  4.0   Chloride 98 - 111 mmol/L 103  105  105   CO2 22 - 32 mmol/L 25  23  21    Calcium  8.9 - 10.3 mg/dL 7.6  7.9  8.0     ABG    Component Value Date/Time   PHART 7.336 (L) 09/28/2023 0854   PCO2ART 38.4 09/28/2023 0854   PO2ART 87 09/28/2023 0854   HCO3 20.5 09/28/2023 0854   TCO2 22 09/28/2023 0854   ACIDBASEDEF 5.0 (H) 09/28/2023 0854    O2SAT 67.5 10/01/2023 0500       Starleen Eastern, MD 10/01/2023 8:10 PM

## 2023-10-01 NOTE — Progress Notes (Signed)
 Patient ID: [name], male   DOB: 20-Feb-1951, 73 y.o.   MRN: [id number]     Advanced Heart Failure Rounding Note  Cardiologist: Hazle Lites, MD   Chief Complaint: Post cardiotomy shock  Subjective:    06/04: CABG X 4 with Dr. [name]. Difficulty separating from cardiopulmonary bypass 06/05: Extubated   POD #4  Patient overall looks much improved from yesterday, CVP down to 11, has had multiple bowel movements as well. Mental status clearing. Clamp NG tube today and hopefully advance diet.      Objective:   Weight Range: 98.1 kg Body mass index is 37.12 kg/m.   Vital Signs:   Temp:  [97.9 F (36.6 C)-99.9 F (37.7 C)] 98.4 F (36.9 C) (06/08 0945) Pulse Rate:  [95-128] 107 (06/08 1030) Resp:  [8-23] 23 (06/08 1030) BP: (71-166)/(48-129) 130/68 (06/08 1030) SpO2:  [85 %-100 %] 97 % (06/08 1030) Weight:  [98.1 kg] 98.1 kg (06/08 0500) Last BM Date : 10/01/23  Weight change: Filed Weights   09/29/23 0500 09/30/23 0500 10/01/23 0500  Weight: 104.7 kg 106.4 kg 98.1 kg    Intake/Output:   Intake/Output Summary (Last 24 hours) at 10/01/2023 1120 Last data filed at 10/01/2023 1000 Gross per 24 hour  Intake 597.46 ml  Output 3506 ml  Net -2908.54 ml      Physical Exam    General: chronically ill appearing Lungs: Normal WOB CV: tachycardic, systolic murmur, JVP mildly elevated, CVP 12 Abdomen: moderate distension Skin: Intact without lesions or rashes.  Neurologic: Lethargic but follows commands.  Extremities: No clubbing or cyanosis.  HEENT: Normal.  Labs    CBC Recent Labs    09/30/23 1600 10/01/23 0500  WBC 11.8* 9.9  HGB 8.8* 9.2*  HCT 28.6* 29.4*  MCV 92.9 91.6  PLT 149* 184   Basic Metabolic Panel Recent Labs    13/08/65 1600 10/01/23 0500  NA 137 138  K 4.0 4.2  CL 105 105  CO2 21* 23  GLUCOSE 116* 116*  BUN 36* 25*  CREATININE 3.94* 2.78*  CALCIUM  8.0* 7.9*  MG 2.9* 1.5*  PHOS 4.4 3.7   Liver Function Tests Recent Labs     09/30/23 0516 09/30/23 1600 10/01/23 0500  AST 31  --   --   ALT 5  --   --   ALKPHOS 55  --   --   BILITOT 0.6  --   --   PROT 6.1*  --   --   ALBUMIN  2.6* 2.3* 2.3*   No results for input(s): "LIPASE", "AMYLASE" in the last 72 hours. Cardiac Enzymes No results for input(s): "CKTOTAL", "CKMB", "CKMBINDEX", "TROPONINI" in the last 72 hours.  BNP: BNP (last 3 results) Recent Labs    09/21/23 0806  BNP 128.5*    ProBNP (last 3 results) No results for input(s): "PROBNP" in the last 8760 hours.   D-Dimer No results for input(s): "DDIMER" in the last 72 hours. Hemoglobin A1C No results for input(s): "HGBA1C" in the last 72 hours. Fasting Lipid Panel No results for input(s): "CHOL", "HDL", "LDLCALC", "TRIG", "CHOLHDL", "LDLDIRECT" in the last 72 hours.  Thyroid  Function Tests No results for input(s): "TSH", "T4TOTAL", "T3FREE", "THYROIDAB" in the last 72 hours.  Invalid input(s): "FREET3"  Other results:   Medications:     Scheduled Medications:  acetaminophen   1,000 mg Oral Q6H   Or   acetaminophen  (TYLENOL ) oral liquid 160 mg/5 mL  1,000 mg Per Tube Q6H   aspirin  EC  325  mg Oral Daily   Or   aspirin   324 mg Per Tube Daily   atorvastatin   80 mg Per Tube Daily   bisacodyl   10 mg Oral Daily   Or   bisacodyl   10 mg Rectal Daily   Chlorhexidine  Gluconate Cloth  6 each Topical Daily   docusate  200 mg Per Tube Daily   heparin  injection (subcutaneous)  5,000 Units Subcutaneous Q8H   insulin  aspart  0-15 Units Subcutaneous Q4H   insulin  glargine-yfgn  28 Units Subcutaneous BID   pantoprazole  (PROTONIX ) IV  40 mg Intravenous Daily   sodium chloride  flush  3 mL Intravenous Q12H    Infusions:  epinephrine  Stopped (10/01/23 0904)   milrinone  0.125 mcg/kg/min (10/01/23 1000)   prismasol BGK 4/2.5 1,500 mL/hr at 10/01/23 0748   prismasol BGK 4/2.5 400 mL/hr at 10/01/23 0300   prismasol BGK 4/2.5 400 mL/hr at 10/01/23 0300    PRN Medications: dextrose , fentaNYL   (SUBLIMAZE ) injection, heparin , levalbuterol, ondansetron  (ZOFRAN ) IV, mouth rinse, oxyCODONE , phenol, sodium chloride  flush, traMADol     Patient Profile   73y.o. male with history of CAD, HFmrEF, uncontrolled DM II, HTN, HLD.   Admitted with NSTEMI and acute on chronic CHF. Underwent CABG X 4 on 09/27/23 c/b postcardiotomy shock.  Assessment/Plan   1. Acute systolic CHF/Cardiogenic shock: Ischemic cardiomyopathy.  Echo pre-CABG with EF 35-40%, RV hard to visualize. Difficulty separating from cardiopulmonary bypass following CABG 09/27/23.  Remains on low dose milrinone  0.125 for RV support. CVVHD for volume and solute removal, but still making urine.  - Continue milrinone  0.125 for RV support for now, hopefully wean in the next few days - CVVH as above  2. CAD: NSTEMI.  Hx PCI to LAD in 2005.  NSTEMI 09/21/23 with multivessel disease on LHC.  S/p CABG X 4 (LIMA to LAD, SVG to OM1, sequential to OM3, SVG to PDA) on 09/27/23 -Start plavix 75mg  daily -Continue Atorvastatin  80. LDL 121 (not adherent with home meds prior to admit)  3. AKI on CKD III: Admission creatinine was 1.9.  Creatinine now well above 4, worsening uremic symptoms. Started on CRRT for clearance, though urine output started to pick up, hopeful for renal recovery following.  - Support cardiac output with milrinone . - CVVH - Stopped lasix  gtt  4. Anemia: Post-op, hgb stable at 9.2   5. DM II: Uncontrolled, A1c 11.3% - Insulin  per TCTS  Discussed with Dr. Deloise Ferries at bedside  Length of Stay: 10 CRITICAL CARE Performed by: Lauralee Poll  Total critical care time: 32 minutes  Critical care time was exclusive of separately billable procedures and treating other patients.  Critical care was necessary to treat or prevent imminent or life-threatening deterioration.  Critical care was time spent personally by me on the following activities: development of treatment plan with patient and/or surrogate as well as  nursing, discussions with consultants, evaluation of patient's response to treatment, examination of patient, obtaining history from patient or surrogate, ordering and performing treatments and interventions, ordering and review of laboratory studies, ordering and review of radiographic studies, pulse oximetry and re-evaluation of patient's condition.  Lauralee Poll 10/01/2023 11:20 AM

## 2023-10-01 NOTE — Progress Notes (Signed)
 NAME:  [name], MRN:  [id number], DOB:  [birth year], LOS: 10 ADMISSION DATE:  09/21/2023, CONSULTATION DATE: 09/28/2023 REFERRING MD: Dr. [name], CHIEF COMPLAINT: Status post CABG x 4  History of Present Illness:  73 year old male with coronary artery disease, diabetes type 2, hypertension, CKD stage IIIa, hyperlipidemia and chronic HFrEF who initially presented about a week ago with epigastric pain radiating to the chest, was admitted with acute NSTEMI, he underwent cardiac cath which showed severe multivessel coronary artery disease with elevated LVEDP.  Patient underwent CABG x 4, he had decreased urine output post CABG, he was not extubated per rapid weaning protocol, remain on vasopressor support with Levophed , epinephrine  and milrinone .  PCCM was consulted for help evaluation medical management  During my evaluation, patient is awake, following commands, tolerating spontaneous breathing trial.  Pertinent  Medical History   Past Medical History:  Diagnosis Date   CAD (coronary artery disease)    Diabetes mellitus without complication (HCC)    Hypercholesterolemia    Hypertension    MI (myocardial infarction) (HCC)    Renal insufficiency     Significant Hospital Events: Including procedures, antibiotic start and stop dates in addition to other pertinent events   5/29 admitted with acute NSTEMI 6/4 CABG x 4 6/5 consulted PCCM 6/7 start CRRT  Interim History / Subjective:  [name] complaints of a sore throat today. He has been coughing some.   Objective    Blood pressure (!) 148/121, pulse (!) 108, temperature 99 F (37.2 C), resp. rate 14, height 5\' 4"  (1.626 m), weight 98.1 kg, SpO2 96%. CVP:  [0 mmHg-29 mmHg] 12 mmHg      Intake/Output Summary (Last 24 hours) at 10/01/2023 0748 Last data filed at 10/01/2023 0700 Gross per 24 hour  Intake 625.81 ml  Output 3638 ml  Net -3012.19 ml   Filed Weights   09/29/23 0500 09/30/23 0500 10/01/23 0500  Weight: 104.7 kg 106.4  kg 98.1 kg    Examination: General: ill appearing man sitting up in bed, more alert today HEENT: [place]/AT, eyes anicteric Neuro: awake, alert, more clear speech and speaking in full sentences today. Moving extremities but globally weak. Chest: breathing comfortably on [place], reduced basilar breath sounds Heart: S1S2, RRR Abdomen: still distended with hypoactive bowel sounds, minimal NGT output Extremities: +edema GU:   I/O -3L, net -6L for admission (questionable since he was on the floor pre-op) UOP 1515, decreasing with CRRT running NGT 100cc  CVP 13  [place] 68% BUN 25, Cr 2.78 on CRRT WBC 9.9 H/H  9.2/29.4 Platelets 184 CXR personally reviewed> low lung volumes, no infiltrates, RIJ CVC & LIJ HD catheter  BG 80-120s  CXR personally reviewed> low lung volumes, LIJ CVC in place, pulm edema.   Resolved problem list   Assessment and Plan  Multivessel coronary artery disease,  acute NSTEMI s/p CABG x 4 Post-bypass cardioplegia requiring inotropic support Acute on chronic HFrEF; EF 35 to 40% Post cardiotomy shock -con't milrinone  and epinephrine  for support -CRRT to manage volume; no additional lasix  needed currently -DAPT now that wires out, statin -pain control per protocol -tele monitoring -GDMT limited with renal failure and shock  Acute pulmonary edema, acute respiratory failure with hypoxia -pulmonary hygiene -volume management with CRRT  AKI on CKD stage IIIa, likely ATN -CRRT -renally dose meds, avoid nephrotoxic meds -strict I/O  H/o Hypertension -con't holding PTA antihypertensives  Hyperlipidemia -statin  Poorly- controlled diabetes type 2 with hyperglycemia- controlled; A1c 11.3 -con't glargine 28 units BID  -  SSI q4h PRN -goal BG <180  Expected perioperative blood loss anemia -transfuse for Hb <7 or hemodynamically significant bleeding  Acute metabolic encephalopathy likely due to renal failure; improved today -CRRT for metabolic clearance -avoid  renally cleared sedatives -PT, OT, higher level mobility as able  Post-op ileus, improved constipation- resolved -start clear liquid diet -clamp NGT   Best Practice (right click and "Reselect all SmartList Selections" daily)   Diet/type: clear liquid DVT prophylaxis: [place] heparin  GI prophylaxis: PPI Lines: Central line, HD cath, Arterial Line, and yes and it is still needed Foley:  d/c when UOP stops Code Status:  full code Last date of multidisciplinary goals of care discussion [6/7 with wife & son]   Labs   CBC: Recent Labs  Lab 09/28/23 1559 09/29/23 0425 09/30/23 0516 09/30/23 1600 10/01/23 0500  WBC 12.0* 13.2* 12.6* 11.8* 9.9  HGB 9.6* 9.7* 9.3* 8.8* 9.2*  HCT 30.2* 31.1* 30.8* 28.6* 29.4*  MCV 90.1 92.3 92.8 92.9 91.6  PLT 165 152 165 149* 184    Basic Metabolic Panel: Recent Labs  Lab 09/27/23 0457 09/27/23 0901 09/28/23 0415 09/28/23 0854 09/28/23 1559 09/29/23 0425 09/30/23 0516 09/30/23 1600 10/01/23 0500  NA 140   < > 140   < > 140 138 137 137 138  K 4.2   < > 4.4   < > 4.9 5.1 4.5 4.0 4.2  CL 106   < > 109  --  103 106 104 105 105  CO2 26   < > 22  --  21* 22 20* 21* 23  GLUCOSE 102*   < > 129*  --  163* 211* 95 116* 116*  BUN 24*   < > 20  --  24* 28* 38* 36* 25*  CREATININE 1.89*   < > 2.36*  --  2.87* 3.78* 4.79* 3.94* 2.78*  CALCIUM  9.1   < > 7.9*  --  8.1* 8.0* 7.9* 8.0* 7.9*  MG 2.1  --  3.4*  --  3.2*  --   --  2.9* 1.5*  PHOS  --   --   --   --   --   --   --  4.4 3.7   < > = values in this interval not displayed.    This patient is critically ill with multiple organ system failure which requires frequent high complexity decision making, assessment, support, evaluation, and titration of therapies. This was completed through the application of advanced monitoring technologies and extensive interpretation of multiple databases. During this encounter critical care time was devoted to patient care services described in this note for 38   minutes.  Nicholas Mussel, DO 10/01/23 7:48 AM [facility]  For contact information, see Amion. If no response to pager, please call PCCM consult pager. After hours, 7PM- 7AM, please call Elink.

## 2023-10-01 NOTE — Evaluation (Signed)
 Physical Therapy Evaluation Patient Details Name: [name] MRN: [id number] DOB: 1950-09-20 Today's Date: 10/01/2023  History of Present Illness  73 y.o. male presenting 09/21/23 with chest pain and elevated troponins. NSTEMI; 5/29 L heart cath with multivessel CAD, 6/04 CABGx4; 6/5 extubated; post-CABG cardioplegia requiring inotropic support; decr renal function with uremic encephalopathy, CRRT started 6/7 PMH: DM, MI, HTN  Clinical Impression   Pt admitted secondary to problem above with deficits below. PTA patient lives with wife in one level home with 3 steps to enter. He was completely independent and does not own any DME. Pt currently is lethargic, on CRRT, and limited by sternal precautions however was able to stand x 4 reps with +2 min assist. Could not unweight his feet for stepping. Currently would consider pt needing intensive inpatient therapies >3 hrs/day, however he may progress more quickly and return home. Anticipate patient will benefit from PT to address problems listed below. Will continue to follow acutely to maximize functional mobility, independence, and safety.           If plan is discharge home, recommend the following: Two people to help with walking and/or transfers;Two people to help with bathing/dressing/bathroom;Direct supervision/assist for medications management;Direct supervision/assist for financial management;Assist for transportation;Help with stairs or ramp for entrance;Supervision due to cognitive status   Can travel by private vehicle        Equipment Recommendations Rolling walker (2 wheels)  Recommendations for Other Services  Rehab consult    Functional Status Assessment Patient has had a recent decline in their functional status and demonstrates the ability to make significant improvements in function in a reasonable and predictable amount of time.     Precautions / Restrictions Precautions Precautions: Sternal;Fall Precaution Booklet Issued:  No Recall of Precautions/Restrictions: Impaired Precaution/Restrictions Comments: educated on precautions and required max cues to adhere; CRRT L internal jugular      Mobility  Bed Mobility Overal bed mobility: Needs Assistance Bed Mobility: Supine to Sit     Supine to sit: Max assist, +2 for physical assistance, HOB elevated     General bed mobility comments: HOB elevated fully, pivoted to sit EOB with pt holding pillow to chest; max assist to scoot forward to EOB    Transfers Overall transfer level: Needs assistance Equipment used: 2 person hand held assist Transfers: Sit to/from Stand, Bed to chair/wheelchair/BSC Sit to Stand: Min assist, +2 physical assistance, +2 safety/equipment, From elevated surface, Via lift equipment           General transfer comment: stood x2 from EOB with no device; pt unable to advance feet to pivot to chair; stood to stedy and then from stedy to get to the chair Transfer via Lift Equipment: Stedy  Ambulation/Gait               General Gait Details: unable to progress his Radiation protection practitioner     Tilt Bed    Modified Rankin (Stroke Patients Only)       Balance Overall balance assessment: Needs assistance Sitting-balance support: No upper extremity supported, Feet supported Sitting balance-Leahy Scale: Poor Sitting balance - Comments: left lean Postural control: Left lateral lean Standing balance support: No upper extremity supported, During functional activity Standing balance-Leahy Scale: Poor                               Pertinent Vitals/Pain  Pain Assessment Pain Assessment: Faces Faces Pain Scale: Hurts little more Pain Location: chest Pain Descriptors / Indicators: Moaning, Grimacing Pain Intervention(s): Limited activity within patient's tolerance, Monitored during session, Repositioned, Other (comment) (educated to use pillow against chest when coughing)    Home  Living Family/patient expects to be discharged to:: Private residence Living Arrangements: Spouse/significant other Available Help at Discharge: Family;Available 24 hours/day Type of Home: House Home Access: Stairs to enter Entrance Stairs-Rails: Doctor, general practice of Steps: 3   Home Layout: One level Home Equipment: None      Prior Function Prior Level of Function : Independent/Modified Independent;Driving                     Extremity/Trunk Assessment   Upper Extremity Assessment Upper Extremity Assessment: Defer to OT evaluation    Lower Extremity Assessment Lower Extremity Assessment: Generalized weakness (bil LE edema)    Cervical / Trunk Assessment Cervical / Trunk Assessment: Other exceptions Cervical / Trunk Exceptions: overweight; sternotomy  Communication   Communication Communication: No apparent difficulties    Cognition Arousal: Lethargic Behavior During Therapy: Flat affect   PT - Cognitive impairments: Attention, Initiation, Sequencing                       PT - Cognition Comments: required max cues to adhere to sternal precautions; assist to initiate all movements (and then pt would "join in") Following commands: Impaired Following commands impaired: Follows one step commands inconsistently, Follows one step commands with increased time     Cueing Cueing Techniques: Verbal cues, Tactile cues     General Comments General comments (skin integrity, edema, etc.): On CRRT and O2 with HR 96-120 and sats 95%    Exercises     Assessment/Plan    PT Assessment Patient needs continued PT services  PT Problem List Decreased strength;Decreased activity tolerance;Decreased balance;Decreased mobility;Decreased cognition;Decreased knowledge of use of DME;Decreased safety awareness;Decreased knowledge of precautions;Cardiopulmonary status limiting activity;Obesity       PT Treatment Interventions DME instruction;Gait  training;Stair training;Functional mobility training;Therapeutic activities;Therapeutic exercise;Balance training;Cognitive remediation;Patient/family education    PT Goals (Current goals can be found in the Care Plan section)  Acute Rehab PT Goals Patient Stated Goal: agrees he wants to get moving PT Goal Formulation: With patient Time For Goal Achievement: 10/15/23 Potential to Achieve Goals: Good    Frequency Min 2X/week     Co-evaluation               AM-PAC PT "6 Clicks" Mobility  Outcome Measure Help needed turning from your back to your side while in a flat bed without using bedrails?: Total Help needed moving from lying on your back to sitting on the side of a flat bed without using bedrails?: Total Help needed moving to and from a bed to a chair (including a wheelchair)?: Total Help needed standing up from a chair using your arms (e.g., wheelchair or bedside chair)?: Total Help needed to walk in hospital room?: Total Help needed climbing 3-5 steps with a railing? : Total 6 Click Score: 6    End of Session Equipment Utilized During Treatment: Oxygen Activity Tolerance: Patient limited by lethargy Patient left: in chair;with call bell/phone within reach;with nursing/sitter in room Nurse Communication: Mobility status;Need for lift equipment;Other (comment) (RN present during session) PT Visit Diagnosis: Unsteadiness on feet (R26.81);Muscle weakness (generalized) (M62.81);Difficulty in walking, not elsewhere classified (R26.2)    Time: 3016-0109 PT Time Calculation (min) (ACUTE ONLY): 22 min  Charges:   PT Evaluation $PT Eval Low Complexity: 1 Low   PT General Charges $$ ACUTE PT VISIT: 1 Visit          Gayle Kava, PT Acute Rehabilitation Services  Office [phone]   [place] Leep 10/01/2023, 3:06 PM

## 2023-10-01 NOTE — Progress Notes (Signed)
 [address]       [place] [address]             [phone]                 4 Days Post-Op Procedure(s) (LRB): CORONARY ARTERY BYPASS GRAFTING (CABG) TIMES FOUR UTILIZING THE LEFT INTERNAL MAMMARY ARTERY AND ENDOSCOPIC VEIN HARVEST RIGHT GREATER SAPHENOUS VEIN (N/A) ECHOCARDIOGRAM, TRANSESOPHAGEAL, INTRAOPERATIVE (N/A)   Events: No events CRRT started  _______________________________________________________________ Vitals: BP 116/71   Pulse (!) 104   Temp 98.6 F (37 C)   Resp 13   Ht 5\' 4"  (1.626 m)   Wt 98.1 kg   SpO2 97%   BMI 37.12 kg/m  Filed Weights   09/29/23 0500 09/30/23 0500 10/01/23 0500  Weight: 104.7 kg 106.4 kg 98.1 kg     - Neuro: alert NAD  - Cardiovascular: sinus tach  Drips:  milr 0.125, CVP:  [0 mmHg-19 mmHg] 10 mmHg  - Pulm: EWOB    ABG    Component Value Date/Time   PHART 7.336 (L) 09/28/2023 0854   PCO2ART 38.4 09/28/2023 0854   PO2ART 87 09/28/2023 0854   HCO3 20.5 09/28/2023 0854   TCO2 22 09/28/2023 0854   ACIDBASEDEF 5.0 (H) 09/28/2023 0854   O2SAT 67.5 10/01/2023 0500    - Abd: softer today - Extremity: edematous  .Intake/Output      06/07 0701 06/08 0700 06/08 0701 06/09 0700   I.V. (mL/kg) 435.8 (4.4) 20 (0.2)   NG/GT 190 75   IV Piggyback 0 13   Total Intake(mL/kg) 625.8 (6.4) 108 (1.1)   Urine (mL/kg/hr) 1515 (0.6) 55 (0.2)   Emesis/NG output 100    Stool 0    CRRT 2023 250   Total Output 3638 305   Net -3012.2 -197        Stool Occurrence 5 x       _______________________________________________________________ Labs:    Latest Ref Rng & Units 10/01/2023    5:00 AM 09/30/2023    4:00 PM 09/30/2023    5:16 AM  CBC  WBC 4.0 - 10.5 K/uL 9.9  11.8  12.6   Hemoglobin 13.0 - 17.0 g/dL 9.2  8.8  9.3   Hematocrit 39.0 - 52.0 % 29.4  28.6  30.8   Platelets 150 - 400 K/uL 184  149  165       Latest Ref Rng & Units 10/01/2023    5:00 AM 09/30/2023    4:00 PM 09/30/2023    5:16 AM  CMP   Glucose 70 - 99 mg/dL 914  782  95   BUN 8 - 23 mg/dL 25  36  38   Creatinine 0.61 - 1.24 mg/dL 9.56  2.13  0.86   Sodium 135 - 145 mmol/L 138  137  137   Potassium 3.5 - 5.1 mmol/L 4.2  4.0  4.5   Chloride 98 - 111 mmol/L 105  105  104   CO2 22 - 32 mmol/L 23  21  20    Calcium  8.9 - 10.3 mg/dL 7.9  8.0  7.9   Total Protein 6.5 - 8.1 g/dL   6.1   Total Bilirubin 0.0 - 1.2 mg/dL   0.6   Alkaline Phos 38 - 126 U/L   55   AST 15 - 41 U/L   31   ALT 0 - 44 U/L   5     CXR: -stable  _______________________________________________________________  Assessment and  Plan: POD 4 s/p CABG, ileus, AKI  Neuro: mental status improving CV: mil.  CVP better today Pulm: pulm hygiene Renal: making urine continue CRRT GI: clamping NG tube today Heme: stable ID: afebrile Endo: on SSI Dispo: continue   Hilarie Lovely 10/01/2023 9:47 AM

## 2023-10-01 NOTE — Progress Notes (Signed)
 Patient ID: [name], male   DOB: 1950/12/09, 73 y.o.   MRN: [id number] [facility] Progress Note   Assessment/ Plan:   1.  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  Developed nonoliguric postoperative acute kidney injury likely from ischemic ATN given difficulties of weaning from cardiopulmonary bypass.  Started on CRRT yesterday for management of volume status (fixed urine output on high-dose furosemide ) and azotemia.  Mental status improving and will continue UF goal of about 100 cc/h to limit aggressive fluid shifts/hypotension. 2.  NSTEMI: With multivessel coronary artery disease and underwent four-vessel CABG on 09/27/2023.  Postoperative course significant for difficulty with getting him off cardiopulmonary bypass and developed postoperative shock with pressor/inotrope support. 3.  Postoperative shock: Previously on norepinephrine , epinephrine  and milrinone .  Currently remains on milrinone  for RV support and has been weaned off of epinephrine /norepinephrine . 4.  Anemia: Likely associated with surgical/postoperative blood loss.  No indication at this time for PRBC transfusion and will continue to trend this.  Subjective:   CRRT started yesterday for management of volume/azotemia.   Objective:   BP 126/82 (BP Location: Right Arm)   Pulse (!) 110   Temp 98.8 F (37.1 C) (Bladder)   Resp 16   Ht 5\' 4"  (1.626 m)   Wt 98.1 kg   SpO2 96%   BMI 37.12 kg/m   Intake/Output Summary (Last 24 hours) at 10/01/2023 8413 Last data filed at 10/01/2023 0800 Gross per 24 hour  Intake 569 ml  Output 3735 ml  Net -3166 ml   Weight change: -8.3 kg  Physical Exam: Gen: Appears comfortable sitting propped up in bed.  More awake/alert and conversant today CVS: Pulse regular tachycardia, S1 and S2 normal Resp: Poor inspiratory effort with decreased breath sounds over bases Abd: Moderately distended, firm  and without tenderness Ext: 2+ anasarca  Imaging: DG CHEST PORT 1 VIEW Result Date: 09/30/2023 CLINICAL DATA:  [id number] Encounter for central line placement [id number] EXAM: PORTABLE CHEST 1 VIEW COMPARISON:  September 29, 2023 FINDINGS: Low lung volume radiograph. The cardiomediastinal silhouette is unchanged in contour.Status post median sternotomy and CABG. Enteric tube tip and side port project over the proximal stomach; enteric tube appears folded within the stomach. LEFT IJ CVC tip terminates over the confluence of the SVC and LEFT brachiocephalic vein. RIGHT IJ sheath tip terminates over the SVC. Removal of PA catheter. No pleural effusion. No pneumothorax. Bilateral band like opacities most consistent with atelectasis. Diffuse gaseous dilation of visualized bowel consistent with reported history of ileus. IMPRESSION: 1. LEFT IJ CVC tip terminates over the confluence of the SVC and LEFT brachiocephalic vein. 2. Enteric tube tip and side port project over the proximal stomach; enteric tube appears folded within the stomach. Electronically Signed   By: Clancy Crimes M.D.   On: 09/30/2023 14:16   DG Abd 1 View Result Date: 09/30/2023 CLINICAL DATA:  Ileus EXAM: ABDOMEN - 1 VIEW COMPARISON:  09/29/2023 FINDINGS: Two supine frontal views of the abdomen and pelvis demonstrate enteric catheter coiled over the gastric fundus. Diffuse gaseous distension of the large and small bowel unchanged since prior exam. No masses or abnormal calcifications. IMPRESSION: 1. Diffuse gaseous distension of large and small bowel compatible with given history of ileus. 2. Enteric catheter tip projects over the gastric fundus. Electronically Signed   By: Bobbye Burrow M.D.   On: 09/30/2023 12:12   ECHOCARDIOGRAM LIMITED Result Date: 09/30/2023  ECHOCARDIOGRAM LIMITED REPORT   Patient Name:   [name] Date of Exam: 09/30/2023 Medical Rec #:  [id number]  Height:       64.0 in Accession #:    [id number] Weight:       234.6 lb Date of  Birth:  05-26-1950   BSA:          2.094 m Patient Age:    73 years   BP:           142/97 mmHg Patient Gender: M          HR:           119 bpm. Exam Location:  Inpatient Procedure: Limited Echo and Intracardiac Opacification Agent (Both Spectral and            Color Flow Doppler were utilized during procedure). Indications:    I50.9* Heart failure (unspecified)  History:        Patient has prior history of Echocardiogram examinations, most                 recent 09/29/2023. CAD, CKD III; Risk Factors:Hypertension,                 Diabetes and Dyslipidemia.  Sonographer:    Sherline Distel Senior RDCS Referring Phys: [phone] [facility]  Sonographer Comments: Technically difficult due to poor echo windows, post OP day 1 IMPRESSIONS  1. Poor image quality with definity  global hypokinesis abnormal septal motion and significant lateral / septal dysynergy . Left ventricular ejection fraction, by estimation, is 25 to 30%. The left ventricle has severely decreased function. The left ventricular internal cavity size was mildly dilated.  2. Right ventricular systolic function was not well visualized. The right ventricular size is normal. FINDINGS  Left Ventricle: Poor image quality with definity  global hypokinesis abnormal septal motion and significant lateral / septal dysynergy. Left ventricular ejection fraction, by estimation, is 25 to 30%. The left ventricle has severely decreased function. Definity  contrast agent was given IV to delineate the left ventricular endocardial borders. The left ventricular internal cavity size was mildly dilated. Right Ventricle: The right ventricular size is normal. Right ventricular systolic function was not well visualized. Pericardium: There is no evidence of pericardial effusion. Janelle Mediate MD Electronically signed by Janelle Mediate MD Signature Date/Time: 09/30/2023/10:23:56 AM    Final    DG Abd Portable 1V Result Date: 09/29/2023 CLINICAL DATA:  NG tube placement EXAM: PORTABLE ABDOMEN - 1 VIEW  COMPARISON:  None Available. FINDINGS: the tip of the nasogastric tube is located within the fundus stomach in good position. IMPRESSION: Nasogastric tube in good position. Electronically Signed   By: Fredrich Jefferson M.D.   On: 09/29/2023 12:23    Labs: BMET Recent Labs  Lab 09/27/23 2155 09/28/23 0014 09/28/23 0415 09/28/23 0854 09/28/23 1559 09/29/23 0425 09/30/23 0516 09/30/23 1600 10/01/23 0500  NA 142   < > 140 140 140 138 137 137 138  K 4.7   < > 4.4 4.6 4.9 5.1 4.5 4.0 4.2  CL 109  --  109  --  103 106 104 105 105  CO2 24  --  22  --  21* 22 20* 21* 23  GLUCOSE 150*  --  129*  --  163* 211* 95 116* 116*  BUN 19  --  20  --  24* 28* 38* 36* 25*  CREATININE 1.94*  --  2.36*  --  2.87* 3.78* 4.79* 3.94* 2.78*  CALCIUM  8.1*  --  7.9*  --  8.1* 8.0* 7.9* 8.0* 7.9*  PHOS  --   --   --   --   --   --   --  4.4 3.7   < > = values in this interval not displayed.   CBC Recent Labs  Lab 09/29/23 0425 09/30/23 0516 09/30/23 1600 10/01/23 0500  WBC 13.2* 12.6* 11.8* 9.9  HGB 9.7* 9.3* 8.8* 9.2*  HCT 31.1* 30.8* 28.6* 29.4*  MCV 92.3 92.8 92.9 91.6  PLT 152 165 149* 184    Medications:     acetaminophen   1,000 mg Oral Q6H   Or   acetaminophen  (TYLENOL ) oral liquid 160 mg/5 mL  1,000 mg Per Tube Q6H   aspirin  EC  325 mg Oral Daily   Or   aspirin   324 mg Per Tube Daily   atorvastatin   80 mg Per Tube Daily   bisacodyl   10 mg Oral Daily   Or   bisacodyl   10 mg Rectal Daily   Chlorhexidine  Gluconate Cloth  6 each Topical Daily   docusate  200 mg Per Tube Daily   heparin  injection (subcutaneous)  5,000 Units Subcutaneous Q8H   insulin  aspart  0-15 Units Subcutaneous Q4H   insulin  glargine-yfgn  28 Units Subcutaneous BID   pantoprazole  (PROTONIX ) IV  40 mg Intravenous Daily   sodium chloride  flush  3 mL Intravenous Q12H    Clevester Dally, MD 10/01/2023, 8:12 AM

## 2023-10-02 DIAGNOSIS — N1831 Chronic kidney disease, stage 3a: Secondary | ICD-10-CM | POA: Diagnosis not present

## 2023-10-02 DIAGNOSIS — R570 Cardiogenic shock: Secondary | ICD-10-CM | POA: Diagnosis not present

## 2023-10-02 DIAGNOSIS — I5023 Acute on chronic systolic (congestive) heart failure: Secondary | ICD-10-CM | POA: Diagnosis not present

## 2023-10-02 DIAGNOSIS — I214 Non-ST elevation (NSTEMI) myocardial infarction: Secondary | ICD-10-CM | POA: Diagnosis not present

## 2023-10-02 LAB — RENAL FUNCTION PANEL
Albumin: 2.2 g/dL — ABNORMAL LOW (ref 3.5–5.0)
Albumin: 2.2 g/dL — ABNORMAL LOW (ref 3.5–5.0)
Anion gap: 7 (ref 5–15)
Anion gap: 6 (ref 5–15)
BUN: 15 mg/dL (ref 8–23)
BUN: 15 mg/dL (ref 8–23)
CO2: 27 mmol/L (ref 22–32)
CO2: 25 mmol/L (ref 22–32)
Calcium: 8 mg/dL — ABNORMAL LOW (ref 8.9–10.3)
Calcium: 7.4 mg/dL — ABNORMAL LOW (ref 8.9–10.3)
Chloride: 104 mmol/L (ref 98–111)
Chloride: 100 mmol/L (ref 98–111)
Creatinine, Ser: 1.9 mg/dL — ABNORMAL HIGH (ref 0.61–1.24)
Creatinine, Ser: 1.83 mg/dL — ABNORMAL HIGH (ref 0.61–1.24)
GFR, Estimated: 37 mL/min — ABNORMAL LOW (ref >60)
GFR, Estimated: 38 mL/min — ABNORMAL LOW (ref >60)
Glucose, Bld: 54 mg/dL — ABNORMAL LOW (ref 70–99)
Glucose, Bld: 230 mg/dL — ABNORMAL HIGH (ref 70–99)
Phosphorus: 2.5 mg/dL (ref 2.5–4.6)
Phosphorus: 2.3 mg/dL — ABNORMAL LOW (ref 2.5–4.6)
Potassium: 3.8 mmol/L (ref 3.5–5.1)
Potassium: 4 mmol/L (ref 3.5–5.1)
Sodium: 138 mmol/L (ref 135–145)
Sodium: 131 mmol/L — ABNORMAL LOW (ref 135–145)

## 2023-10-02 LAB — CBC
HCT: 30.1 % — ABNORMAL LOW (ref 39.0–52.0)
Hemoglobin: 9.2 g/dL — ABNORMAL LOW (ref 13.0–17.0)
MCH: 28.1 pg (ref 26.0–34.0)
MCHC: 30.6 g/dL (ref 30.0–36.0)
MCV: 92 fL (ref 80.0–100.0)
Platelets: 163 10*3/uL (ref 150–400)
RBC: 3.27 MIL/uL — ABNORMAL LOW (ref 4.22–5.81)
RDW: 14.2 % (ref 11.5–15.5)
WBC: 9.8 10*3/uL (ref 4.0–10.5)
nRBC: 0 % (ref 0.0–0.2)

## 2023-10-02 LAB — GLUCOSE, CAPILLARY
Glucose-Capillary: 114 mg/dL — ABNORMAL HIGH (ref 70–99)
Glucose-Capillary: 127 mg/dL — ABNORMAL HIGH (ref 70–99)
Glucose-Capillary: 141 mg/dL — ABNORMAL HIGH (ref 70–99)
Glucose-Capillary: 222 mg/dL — ABNORMAL HIGH (ref 70–99)
Glucose-Capillary: 50 mg/dL — ABNORMAL LOW (ref 70–99)
Glucose-Capillary: 73 mg/dL (ref 70–99)
Glucose-Capillary: 83 mg/dL (ref 70–99)

## 2023-10-02 LAB — MAGNESIUM: Magnesium: 2.8 mg/dL — ABNORMAL HIGH (ref 1.7–2.4)

## 2023-10-02 LAB — COOXEMETRY PANEL
Carboxyhemoglobin: 1.5 % (ref 0.5–1.5)
Methemoglobin: <0.7 % (ref 0.0–1.5)
O2 Saturation: 66.4 %
Total hemoglobin: 9.4 g/dL — ABNORMAL LOW (ref 12.0–16.0)

## 2023-10-02 LAB — APTT: aPTT: 68 s — ABNORMAL HIGH (ref 24–36)

## 2023-10-02 MED ORDER — RENA-VITE PO TABS
1.0000 | ORAL_TABLET | Freq: Every day | ORAL | Status: DC
  Administered 2023-10-03 – 2023-10-12 (×10): 1 via ORAL
  Filled 2023-10-02 (×11): qty 1

## 2023-10-02 MED ORDER — POTASSIUM & SODIUM PHOSPHATES 280-160-250 MG PO PACK
2.0000 | PACK | ORAL | Status: DC
  Filled 2023-10-02 (×2): qty 2

## 2023-10-02 MED ORDER — DOCUSATE SODIUM 50 MG/5ML PO LIQD
200.0000 mg | Freq: Every day | ORAL | Status: DC
  Administered 2023-10-02 – 2023-10-05 (×4): 200 mg via ORAL
  Filled 2023-10-02 (×4): qty 20

## 2023-10-02 MED ORDER — RENA-VITE PO TABS
1.0000 | ORAL_TABLET | Freq: Every day | ORAL | Status: DC
  Administered 2023-10-02: 1
  Filled 2023-10-02: qty 1

## 2023-10-02 MED ORDER — SODIUM PHOSPHATES 45 MMOLE/15ML IV SOLN
15.0000 mmol | Freq: Once | INTRAVENOUS | Status: AC
  Administered 2023-10-02: 15 mmol via INTRAVENOUS
  Filled 2023-10-02: qty 5

## 2023-10-02 MED ORDER — OXYCODONE HCL 5 MG PO TABS
5.0000 mg | ORAL_TABLET | ORAL | Status: DC | PRN
  Administered 2023-10-03 – 2023-10-04 (×3): 10 mg via ORAL
  Filled 2023-10-02 (×3): qty 2

## 2023-10-02 MED ORDER — TRAMADOL HCL 50 MG PO TABS: 50.0000 mg | ORAL_TABLET | Freq: Four times a day (QID) | ORAL | Status: DC | PRN

## 2023-10-02 MED ORDER — INSULIN GLARGINE-YFGN 100 UNIT/ML ~~LOC~~ SOLN
20.0000 [IU] | Freq: Two times a day (BID) | SUBCUTANEOUS | Status: DC
  Filled 2023-10-02 (×2): qty 0.2

## 2023-10-02 MED ORDER — POTASSIUM & SODIUM PHOSPHATES 280-160-250 MG PO PACK
2.0000 | PACK | ORAL | Status: AC
  Administered 2023-10-02 (×4): 2 via ORAL
  Filled 2023-10-02 (×4): qty 2

## 2023-10-02 MED ORDER — DEXTROSE 50 % IV SOLN
25.0000 g | INTRAVENOUS | Status: AC
  Administered 2023-10-02: 25 g via INTRAVENOUS

## 2023-10-02 MED ORDER — BOOST / RESOURCE BREEZE PO LIQD CUSTOM
1.0000 | Freq: Three times a day (TID) | ORAL | Status: DC
  Administered 2023-10-02 – 2023-10-11 (×11): 1 via ORAL

## 2023-10-02 MED ORDER — PROSOURCE PLUS PO LIQD
30.0000 mL | Freq: Three times a day (TID) | ORAL | Status: DC
  Administered 2023-10-02 – 2023-10-11 (×23): 30 mL via ORAL
  Filled 2023-10-02 (×23): qty 30

## 2023-10-02 MED ORDER — INSULIN GLARGINE-YFGN 100 UNIT/ML ~~LOC~~ SOLN
20.0000 [IU] | Freq: Two times a day (BID) | SUBCUTANEOUS | Status: DC
  Administered 2023-10-02: 20 [IU] via SUBCUTANEOUS
  Filled 2023-10-02 (×3): qty 0.2

## 2023-10-02 MED ORDER — ASPIRIN 81 MG PO CHEW
81.0000 mg | CHEWABLE_TABLET | Freq: Every day | ORAL | Status: DC
  Administered 2023-10-02 – 2023-10-07 (×6): 81 mg via ORAL
  Filled 2023-10-02 (×6): qty 1

## 2023-10-02 MED ORDER — ATORVASTATIN CALCIUM 80 MG PO TABS
80.0000 mg | ORAL_TABLET | Freq: Every day | ORAL | Status: DC
  Administered 2023-10-02 – 2023-10-13 (×12): 80 mg via ORAL
  Filled 2023-10-02 (×12): qty 1

## 2023-10-02 MED ORDER — CLOPIDOGREL BISULFATE 75 MG PO TABS
75.0000 mg | ORAL_TABLET | Freq: Every day | ORAL | Status: DC
  Administered 2023-10-02 – 2023-10-13 (×12): 75 mg via ORAL
  Filled 2023-10-02 (×12): qty 1

## 2023-10-02 MED ORDER — METOCLOPRAMIDE HCL 5 MG/ML IJ SOLN
10.0000 mg | Freq: Three times a day (TID) | INTRAMUSCULAR | Status: DC
  Administered 2023-10-02 – 2023-10-05 (×9): 10 mg via INTRAVENOUS
  Filled 2023-10-02 (×9): qty 2

## 2023-10-02 MED FILL — Potassium Chloride Inj 2 mEq/ML: INTRAVENOUS | Qty: 40 | Status: AC

## 2023-10-02 MED FILL — Heparin Sodium (Porcine) Inj 1000 Unit/ML: Qty: 1000 | Status: AC

## 2023-10-02 MED FILL — Magnesium Sulfate Inj 50%: INTRAMUSCULAR | Qty: 10 | Status: AC

## 2023-10-02 NOTE — Inpatient Diabetes Management (Signed)
 Inpatient Diabetes Program Recommendations  AACE/ADA: New Consensus Statement on Inpatient Glycemic Control (2015)  Target Ranges:  Prepandial:   less than 140 mg/dL      Peak postprandial:   less than 180 mg/dL (1-2 hours)      Critically ill patients:  140 - 180 mg/dL   Lab Results  Component Value Date   GLUCAP 73 10/02/2023   HGBA1C 11.3 (H) 09/22/2023    Latest Reference Range & Units 10/01/23 07:53 10/01/23 11:24 10/01/23 15:56 10/01/23 19:33 10/01/23 23:47 10/02/23 04:08 10/02/23 04:48 10/02/23 07:49  Glucose-Capillary 70 - 99 mg/dL 147 (H) 829 (H) 562 (H) Novolog  2 units 150 (H) Novolog  2 units 91 50 (L) 83 73  (H): Data is abnormally high (L): Data is abnormally low  Home DM Meds: Basaglar  65 units every day Fiasp  40 units with dinner Metformin  500 mg BID Freestyle Libre 3 CGM     Current Orders: Semglee  20 units bid Novolog  Moderate Correction Scale/ SSI (0-15 units) q 4 hrs.  Inpatient Diabetes Program Recommendations:   Noted hypoglycemia of 54 this morning. Semglee  decreased to 20 units bid Please also consider: -Decrease Novolog  correction to 0-9 units q 4 hrs.  Thank you, Manasseh Pittsley E. Ahamed Hofland, RN, MSN, CDCES  Diabetes Coordinator Inpatient Glycemic Control Team Team Pager [phone] (8am-5pm) 10/02/2023 8:48 AM

## 2023-10-02 NOTE — Progress Notes (Addendum)
 Patient ID: [name], male   DOB: Jun 05, 1950, 73 y.o.   MRN: [id number]     Advanced Heart Failure Rounding Note  Cardiologist: Hazle Lites, MD   Chief Complaint: Post cardiotomy shock  Subjective:    06/04: CABG X 4 with Dr. [name]. Difficulty separating from cardiopulmonary bypass 06/05: Extubated   POD #5 Co-ox 66%. CVP 12. On Milrinone  0.125 mcg/kg/min. Remains on CRRT (net negative 2.3L) sCr improving 2.22>1.90. Weight significantly 15lbs overnight.   Sitting up in chair. Dosing during exam. Having some substernal chest pain and abdomin tightness. Reports that he is still having BMs.   Objective:    Weight Range: (S) 90.7 kg Body mass index is 34.32 kg/m.   Vital Signs:   Temp:  [98 F (36.7 C)-98.8 F (37.1 C)] 98 F (36.7 C) (06/08 1930) Pulse Rate:  [83-124] 107 (06/09 0900) Resp:  [7-26] 19 (06/09 0900) BP: (64-157)/(48-131) 105/86 (06/09 0900) SpO2:  [91 %-100 %] 98 % (06/09 0900) Weight:  [90.7 kg] 90.7 kg (06/09 0500) Last BM Date : 10/01/23  Weight change: Filed Weights   09/30/23 0500 10/01/23 0500 10/02/23 0500  Weight: 106.4 kg 98.1 kg (S) 90.7 kg   Intake/Output:  Intake/Output Summary (Last 24 hours) at 10/02/2023 0929 Last data filed at 10/02/2023 0900 Gross per 24 hour  Intake 643.5 ml  Output 2975.6 ml  Net -2332.1 ml    Physical Exam    CVP 12 General: Weak appearing. No distress on [place] Cardiac: S1 and S2 present. No murmurs or rub. Abdomen: Firm, distended, hypoactive Extremities: Warm and dry.  Trace BLE edema.  Neuro: Alert and oriented x3. Lethargic Lines/Devices:  LIJ HDL, RIJ CVL  Labs    CBC Recent Labs    10/01/23 0500 10/02/23 0410  WBC 9.9 9.8  HGB 9.2* 9.2*  HCT 29.4* 30.1*  MCV 91.6 92.0  PLT 184 163   Basic Metabolic Panel Recent Labs    41/66/06 0500 10/01/23 1604 10/02/23 0410  NA 138 138 138  K 4.2 3.9 3.8  CL 105 103 104  CO2 23 25 27   GLUCOSE 116* 131* 54*  BUN 25* 19 15  CREATININE 2.78*  2.22* 1.90*  CALCIUM  7.9* 7.6* 8.0*  MG 1.5*  --  2.8*  PHOS 3.7 2.4* 2.5   Liver Function Tests Recent Labs    09/30/23 0516 09/30/23 1600 10/01/23 1604 10/02/23 0410  AST 31  --   --   --   ALT 5  --   --   --   ALKPHOS 55  --   --   --   BILITOT 0.6  --   --   --   PROT 6.1*  --   --   --   ALBUMIN  2.6*   < > 2.1* 2.2*   < > = values in this interval not displayed.   BNP (last 3 results) Recent Labs    09/21/23 0806  BNP 128.5*   Medications:    Scheduled Medications:  acetaminophen   1,000 mg Oral Q6H   Or   acetaminophen  (TYLENOL ) oral liquid 160 mg/5 mL  1,000 mg Per Tube Q6H   aspirin   81 mg Oral Daily   atorvastatin   80 mg Oral Daily   bisacodyl   10 mg Oral Daily   Or   bisacodyl   10 mg Rectal Daily   Chlorhexidine  Gluconate Cloth  6 each Topical Daily   clopidogrel  75 mg Oral Daily   docusate  200 mg  Oral Daily   heparin  injection (subcutaneous)  5,000 Units Subcutaneous Q8H   insulin  aspart  0-15 Units Subcutaneous Q4H   insulin  glargine-yfgn  20 Units Subcutaneous BID   pantoprazole  (PROTONIX ) IV  40 mg Intravenous Daily   potassium & sodium phosphates  2 packet Oral Q4H   sodium chloride  flush  3 mL Intravenous Q12H    Infusions:  epinephrine  Stopped (10/02/23 0639)   heparin  10,000 units/ 20 mL infusion syringe 500 Units/hr (10/01/23 2142)   milrinone  0.125 mcg/kg/min (10/02/23 0900)   prismasol BGK 4/2.5 1,500 mL/hr at 10/02/23 0753   prismasol BGK 4/2.5 400 mL/hr at 10/02/23 0500   prismasol BGK 4/2.5 400 mL/hr at 10/02/23 0500    PRN Medications: dextrose , fentaNYL  (SUBLIMAZE ) injection, heparin , levalbuterol, ondansetron  (ZOFRAN ) IV, mouth rinse, oxyCODONE , phenol, sodium chloride  flush, traMADol   Patient Profile   73 y.o. male with history of CAD, HFmrEF, uncontrolled DM II, HTN, HLD. Admitted with NSTEMI and acute on chronic CHF. Underwent CABG X 4 on 09/27/23 c/b postcardiotomy shock.  Assessment/Plan   1. Acute systolic  CHF/Cardiogenic shock: Ischemic cardiomyopathy. Echo pre-CABG with EF 35-40%, RV hard to visualize. Difficulty separating from cardiopulmonary bypass following CABG 09/27/23.  Remains on low dose milrinone  0.125 for RV support. CVVHD for volume and solute removal, but still making urine.  - Continue milrinone  0.125 for RV support for now, hopefully wean in the next few days - CVP 12. Volume removal per CRRT.   2. CAD: NSTEMI. Hx PCI to LAD in 2005.  NSTEMI 09/21/23 with multivessel disease on LHC.  S/p CABG X 4 (LIMA to LAD, SVG to OM1, sequential to OM3, SVG to PDA) on 09/27/23 - Continue ASA + plavix 75mg  daily - Continue atorva 80. LDL 121 (not adherent with home meds prior to admit)  3. AKI on CKD III: Admission creatinine was 1.9.  Creatinine peaked above 4, worsening uremic symptoms. Started on CRRT for clearance, though urine output started to pick up, hopeful for renal recovery following.  - Support cardiac output with milrinone . - On CRRT  4. Anemia: Post-op, hgb stable.  5. DM II: Uncontrolled, A1c 11.3% - Insulin  per TCTS  Length of Stay: 11  CRITICAL CARE Performed by: [place] Lee  Total critical care time: 14 minutes  -Critical care time was exclusive of separately billable procedures and treating other patients. -Critical care was necessary to treat or prevent imminent or life-threatening deterioration. -Critical care was time spent personally by me on the following activities: development of treatment plan with patient and/or surrogate as well as nursing, discussions with consultants, evaluation of patient's response to treatment, examination of patient, obtaining history from patient or surrogate, ordering and performing treatments and interventions, ordering and review of laboratory studies, ordering and review of radiographic studies, pulse oximetry and re-evaluation of patient's condition.  [place] Lee 10/02/2023, 9:29 AM  Advanced Heart Failure Team Pager [phone] (M-F; 7a  - 5p)   Agree with above.   Remains on milrinone  for RV support. Anuric. Remains on CRRT. Continue with significant ileus. Remains in NSR  Feels weak.   General:  Sitting up in chair No resp difficulty HEENT: normal Neck: supple. JVP to jaw Carotids 2+ bilat; no bruits. No lymphadenopathy or thryomegaly appreciated. Cor: Reg  Lungs: clear Abdomen: markedly distended.Hypoactive bowel sounds. Extremities: no cyanosis, clubbing, rash, 1-2+ edema Neuro: alert & orientedx3, cranial nerves grossly intact. moves all 4 extremities w/o difficulty. Affect pleasant  Continue milrinone  for RV support until volume status optimized with  CVVHD.   Start reglan  for ileus.   Continue to mobilize.   CRITICAL CARE Performed by: Jules Oar  Total critical care time: 35 minutes  Critical care time was exclusive of separately billable procedures and treating other patients.  Critical care was necessary to treat or prevent imminent or life-threatening deterioration.  Critical care was time spent personally by me (independent of midlevel providers or residents) on the following activities: development of treatment plan with patient and/or surrogate as well as nursing, discussions with consultants, evaluation of patient's response to treatment, examination of patient, obtaining history from patient or surrogate, ordering and performing treatments and interventions, ordering and review of laboratory studies, ordering and review of radiographic studies, pulse oximetry and re-evaluation of patient's condition.  Jules Oar, MD  3:39 PM

## 2023-10-02 NOTE — Progress Notes (Addendum)
 Patient ID: [name], male   DOB: 04/11/1951, 73 y.o.   MRN: [id number] [facility] Progress Note   Assessment/ Plan:    1.  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  Developed nonoliguric postoperative acute kidney injury likely from ischemic ATN given difficulties of weaning from cardiopulmonary bypass.  Started on CRRT on 6/7 for management of volume status (fixed urine output on high-dose furosemide ) and azotemia with mental status improving after CRRT per charting  ------------------ - Continue CRRT.  UF goal 100 ml/hr as tolerated  - Await AM labs  2.  NSTEMI: With multivessel coronary artery disease and underwent four-vessel CABG on 09/27/2023.  Postoperative course significant for difficulty with getting him off cardiopulmonary bypass and developed postoperative shock with pressor/inotrope support.  3.  Postoperative shock: Previously on norepinephrine , epinephrine  (now off) and on milrinone  for RV support   4.  Normocytic Anemia: Likely associated with surgical/postoperative blood loss. PRBC's per primary team   5. CKD stage 3a - note baseline Cr 1.3 - 1.6   Disposition - in ICU on CRRT   Subjective:   He has been on CRRT.  He had 2.6 L UF over 6/8 with CRRT.  He had 135 mL uop over 6/8. (Down from 1.5 liters the day prior).  CVP hasn't had a good waveform.  Per nursing he has been on epi at 1 mcg/min due to hypotension when he moves - may come off of this.  He has been net negative 100 ml/hr as tolerated and is tolerating.  Has been on 4 liters oxygen   Review of systems:  NG tube was clamped yesterday; few small BM's per nursing  Denies shortness of breath Throat discomfort    Objective:   BP 92/74 (BP Location: Right Arm)   Pulse 83   Temp 98 F (36.7 C) (Oral)   Resp 11   Ht 5\' 4"  (1.626 m)   Wt 98.1 kg   SpO2 100%   BMI 37.12 kg/m   Intake/Output Summary (Last 24  hours) at 10/02/2023 7253 Last data filed at 10/02/2023 0400 Gross per 24 hour  Intake 787.37 ml  Output 3126 ml  Net -2338.63 ml   Weight change:   Physical Exam:   General adult male in chair in no acute distress HEENT normocephalic atraumatic extraocular movements intact sclera anicteric Neck supple trachea midline Lungs clear to auscultation bilaterally normal work of breathing at rest on 4 liters; midline sternal incision  Heart S1S2 Abdomen soft nontender nondistended Extremities trace edema  Psych normal mood and affect; no anxiety or agitation  Neuro awake and conversant; follows basic commands  Access: left internal jugular nontunneled dialysis catheter    Imaging: DG CHEST PORT 1 VIEW Result Date: 10/01/2023 CLINICAL DATA:  73 year old male postoperative day 4 status post CABG. EXAM: PORTABLE CHEST 1 VIEW COMPARISON:  Portable chest yesterday and earlier. FINDINGS: AP portable view 0521 hours. Enteric tube remains in place, loops in the left upper quadrant as before. Bilateral IJ vascular catheters are unchanged. No mediastinal or chest tube identified now. Larger lung volumes. Stable cardiac size and mediastinal contours. Sequelae of CABG. No pneumothorax or pulmonary edema. Mild veiling lung base opacity greater on the left. Some superimposed left mid lung linear and platelike atelectasis is stable. No areas of worsening ventilation. Stable visualized osseous structures.  Negative visible bowel gas. IMPRESSION: 1.  Stable lines and tubes. 2. No pneumothorax  or pulmonary edema. Larger lung volumes with residual atelectasis, veiling lung base opacity which may reflect small pleural effusion(s). Electronically Signed   By: Marlise Simpers M.D.   On: 10/01/2023 08:42   DG CHEST PORT 1 VIEW Result Date: 09/30/2023 CLINICAL DATA:  [id number] Encounter for central line placement [id number] EXAM: PORTABLE CHEST 1 VIEW COMPARISON:  September 29, 2023 FINDINGS: Low lung volume radiograph. The cardiomediastinal  silhouette is unchanged in contour.Status post median sternotomy and CABG. Enteric tube tip and side port project over the proximal stomach; enteric tube appears folded within the stomach. LEFT IJ CVC tip terminates over the confluence of the SVC and LEFT brachiocephalic vein. RIGHT IJ sheath tip terminates over the SVC. Removal of PA catheter. No pleural effusion. No pneumothorax. Bilateral band like opacities most consistent with atelectasis. Diffuse gaseous dilation of visualized bowel consistent with reported history of ileus. IMPRESSION: 1. LEFT IJ CVC tip terminates over the confluence of the SVC and LEFT brachiocephalic vein. 2. Enteric tube tip and side port project over the proximal stomach; enteric tube appears folded within the stomach. Electronically Signed   By: Clancy Crimes M.D.   On: 09/30/2023 14:16   DG Abd 1 View Result Date: 09/30/2023 CLINICAL DATA:  Ileus EXAM: ABDOMEN - 1 VIEW COMPARISON:  09/29/2023 FINDINGS: Two supine frontal views of the abdomen and pelvis demonstrate enteric catheter coiled over the gastric fundus. Diffuse gaseous distension of the large and small bowel unchanged since prior exam. No masses or abnormal calcifications. IMPRESSION: 1. Diffuse gaseous distension of large and small bowel compatible with given history of ileus. 2. Enteric catheter tip projects over the gastric fundus. Electronically Signed   By: Bobbye Burrow M.D.   On: 09/30/2023 12:12   ECHOCARDIOGRAM LIMITED Result Date: 09/30/2023    ECHOCARDIOGRAM LIMITED REPORT   Patient Name:   [name] Date of Exam: 09/30/2023 Medical Rec #:  [id number]  Height:       64.0 in Accession #:    [id number] Weight:       234.6 lb Date of Birth:  Mar 20, 1951   BSA:          2.094 m Patient Age:    73 years   BP:           142/97 mmHg Patient Gender: M          HR:           119 bpm. Exam Location:  Inpatient Procedure: Limited Echo and Intracardiac Opacification Agent (Both Spectral and            Color Flow Doppler  were utilized during procedure). Indications:    I50.9* Heart failure (unspecified)  History:        Patient has prior history of Echocardiogram examinations, most                 recent 09/29/2023. CAD, CKD III; Risk Factors:Hypertension,                 Diabetes and Dyslipidemia.  Sonographer:    Sherline Distel Senior RDCS Referring Phys: [phone] Jolinda [facility]  Sonographer Comments: Technically difficult due to poor echo windows, post OP day 1 IMPRESSIONS  1. Poor image quality with definity  global hypokinesis abnormal septal motion and significant lateral / septal dysynergy . Left ventricular ejection fraction, by estimation, is 25 to 30%. The left ventricle has severely decreased function. The left ventricular internal cavity size was mildly dilated.  2. Right ventricular systolic function was not  well visualized. The right ventricular size is normal. FINDINGS  Left Ventricle: Poor image quality with definity  global hypokinesis abnormal septal motion and significant lateral / septal dysynergy. Left ventricular ejection fraction, by estimation, is 25 to 30%. The left ventricle has severely decreased function. Definity  contrast agent was given IV to delineate the left ventricular endocardial borders. The left ventricular internal cavity size was mildly dilated. Right Ventricle: The right ventricular size is normal. Right ventricular systolic function was not well visualized. Pericardium: There is no evidence of pericardial effusion. Janelle Mediate MD Electronically signed by Janelle Mediate MD Signature Date/Time: 09/30/2023/10:23:56 AM    Final     Labs: BMET Recent Labs  Lab 09/28/23 2956 09/28/23 2130 09/28/23 1559 09/29/23 0425 09/30/23 0516 09/30/23 1600 10/01/23 0500 10/01/23 1604  NA 140 140 140 138 137 137 138 138  K 4.4 4.6 4.9 5.1 4.5 4.0 4.2 3.9  CL 109  --  103 106 104 105 105 103  CO2 22  --  21* 22 20* 21* 23 25  GLUCOSE 129*  --  163* 211* 95 116* 116* 131*  BUN 20  --  24* 28* 38* 36* 25* 19   CREATININE 2.36*  --  2.87* 3.78* 4.79* 3.94* 2.78* 2.22*  CALCIUM  7.9*  --  8.1* 8.0* 7.9* 8.0* 7.9* 7.6*  PHOS  --   --   --   --   --  4.4 3.7 2.4*   CBC Recent Labs  Lab 09/30/23 0516 09/30/23 1600 10/01/23 0500 10/02/23 0410  WBC 12.6* 11.8* 9.9 9.8  HGB 9.3* 8.8* 9.2* 9.2*  HCT 30.8* 28.6* 29.4* 30.1*  MCV 92.8 92.9 91.6 92.0  PLT 165 149* 184 163    Medications:     acetaminophen   1,000 mg Oral Q6H   Or   acetaminophen  (TYLENOL ) oral liquid 160 mg/5 mL  1,000 mg Per Tube Q6H   aspirin   81 mg Per Tube Daily   atorvastatin   80 mg Per Tube Daily   bisacodyl   10 mg Oral Daily   Or   bisacodyl   10 mg Rectal Daily   Chlorhexidine  Gluconate Cloth  6 each Topical Daily   clopidogrel  75 mg Per Tube Daily   docusate  200 mg Per Tube Daily   heparin  injection (subcutaneous)  5,000 Units Subcutaneous Q8H   insulin  aspart  0-15 Units Subcutaneous Q4H   insulin  glargine-yfgn  28 Units Subcutaneous BID   pantoprazole  (PROTONIX ) IV  40 mg Intravenous Daily   sodium chloride  flush  3 mL Intravenous Q12H    Nan Aver, MD 10/02/2023, 6:10 AM   To correct charting above - Note pt with abdominal distention and ileus  Nan Aver, MD 6:34 AM 10/02/2023

## 2023-10-02 NOTE — Evaluation (Signed)
 Occupational Therapy Evaluation Patient Details Name: [name] MRN: [id number] DOB: [birth year] Today's Date: 10/02/2023   History of Present Illness   73 y.o. male presenting 09/21/23 with chest pain and elevated troponins. NSTEMI; 5/29 L heart cath with multivessel CAD, 6/04 CABGx4; 6/5 extubated; post-CABG cardioplegia requiring inotropic support; decr renal function with uremic encephalopathy, CRRT started 6/7 PMH: DM, MI, HTN     Clinical Impressions PTA pt lives independently with his wife and dog Elizabeth. Pt states he "likes to walk". Pt currently requires use of Stedy with +2 Max A to lift from lower chair to bed. Pt on CRRT - nsg assisted with mobility. Requires Max to total A with ADL tasks due to below listed deficits. Anticipate pt will make good progress and benefit from intensive inpatient follow-up therapy, >3 hours/day to maximize functional level fo independence to facilitate DC home with his wife. Acute OT to follow. VSS     If plan is discharge home, recommend the following:    (NA)     Functional Status Assessment   Patient has had a recent decline in their functional status and demonstrates the ability to make significant improvements in function in a reasonable and predictable amount of time.     Equipment Recommendations   BSC/3in1     Recommendations for Other Services   Rehab consult     Precautions/Restrictions   Precautions Precautions: Sternal;Fall Precaution Booklet Issued: No Recall of Precautions/Restrictions: Impaired Precaution/Restrictions Comments: educated on precautions and required max cues to adhere; CRRT L internal jugular Restrictions Weight Bearing Restrictions Per Provider Order: Yes Other Position/Activity Restrictions: sternal     Mobility Bed Mobility Overal bed mobility: Needs Assistance Bed Mobility: Sit to Supine     Supine to sit: Max assist, +2 for physical assistance, HOB elevated           Transfers Overall transfer level: Needs assistance Equipment used: 2 person hand held assist Transfers: Sit to/from Stand, Bed to chair/wheelchair/BSC Sit to Stand: +2 physical assistance, Via lift equipment, Max assist           General transfer comment: Increased assistance form chair due to low level; Able to stand from Stedy paddles x 3 with min A however mod A to maintain upright posture Transfer via Lift Equipment: Stedy    Balance Overall balance assessment: Needs assistance Sitting-balance support: No upper extremity supported, Feet supported Sitting balance-Leahy Scale: Poor Sitting balance - Comments: left lean Postural control: Left lateral lean                                 ADL either performed or assessed with clinical judgement   ADL Overall ADL's : Needs assistance/impaired     Grooming: Minimal assistance;Sitting   Upper Body Bathing: Maximal assistance;Sitting   Lower Body Bathing: Total assistance;Bed level   Upper Body Dressing : Total assistance   Lower Body Dressing: Total assistance       Toileting- Clothing Manipulation and Hygiene: Total assistance       Functional mobility during ADLs: +2 for physical assistance;Maximal assistance (used [place])       Vision Patient Visual Report:  (eyes closed majority of session; states he does not wear glasses)       Perception         Praxis         Pertinent Vitals/Pain Pain Assessment Pain Assessment: Faces Faces Pain Scale: Hurts little more Pain  Location: chest Pain Descriptors / Indicators: Discomfort Pain Intervention(s): Limited activity within patient's tolerance     Extremity/Trunk Assessment Upper Extremity Assessment Upper Extremity Assessment: Generalized weakness;Difficult to assess due to impaired cognition   Lower Extremity Assessment Lower Extremity Assessment: Defer to PT evaluation   Cervical / Trunk Assessment Cervical / Trunk Assessment: Other  exceptions Cervical / Trunk Exceptions: overweight; sternotomy   Communication Communication Communication: No apparent difficulties   Cognition Arousal: Lethargic Behavior During Therapy: WFL for tasks assessed/performed Cognition: Difficult to assess Difficult to assess due to: Level of arousal           OT - Cognition Comments: singing songs at times; more alert once mobilized back to bed                 Following commands: Impaired Following commands impaired: Follows one step commands with increased time     Cueing  General Comments   Cueing Techniques: Verbal cues;Tactile cues      Exercises Exercises: Other exercises Other Exercises Other Exercises: bridging in supine Other Exercises: hell slides x 15 BLE   Shoulder Instructions      Home Living Family/patient expects to be discharged to:: Private residence Living Arrangements: Spouse/significant other Available Help at Discharge: Family;Available 24 hours/day Type of Home: House Home Access: Stairs to enter [employer] of Steps: 3 Entrance Stairs-Rails: Right;Left Home Layout: One level     Bathroom Shower/Tub: Chief Strategy Officer: Standard Bathroom Accessibility: Yes How Accessible: Accessible via walker Home Equipment: None          Prior Functioning/Environment Prior Level of Function : Independent/Modified Independent;Driving                    OT Problem List: Decreased strength;Decreased range of motion;Decreased activity tolerance;Impaired balance (sitting and/or standing);Decreased safety awareness;Decreased knowledge of use of DME or AE;Decreased knowledge of precautions;Cardiopulmonary status limiting activity;Obesity;Impaired UE functional use;Increased edema   OT Treatment/Interventions: Self-care/ADL training;Therapeutic exercise;Energy conservation;DME and/or AE instruction;Therapeutic activities;Patient/family education;Balance training       OT Goals(Current goals can be found in the care plan section)   Acute Rehab OT Goals Patient Stated Goal: walk OT Goal Formulation: With patient Time For Goal Achievement: 10/16/23 Potential to Achieve Goals: Good   OT Frequency:  Min 2X/week    Co-evaluation              AM-PAC OT "6 Clicks" Daily Activity     Outcome Measure Help from another person eating meals?: A Little Help from another person taking care of personal grooming?: A Little Help from another person toileting, which includes using toliet, bedpan, or urinal?: Total Help from another person bathing (including washing, rinsing, drying)?: A Lot Help from another person to put on and taking off regular upper body clothing?: A Lot Help from another person to put on and taking off regular lower body clothing?: Total 6 Click Score: 12   End of Session Equipment Utilized During Treatment: Gait belt;Oxygen Nurse Communication: Mobility status;Need for lift equipment  Activity Tolerance: Patient tolerated treatment well Patient left: in bed;with call bell/phone within reach;with nursing/sitter in room  OT Visit Diagnosis: Unsteadiness on feet (R26.81);Other abnormalities of gait and mobility (R26.89);Muscle weakness (generalized) (M62.81)                Time: 1050-1130 OT Time Calculation (min): 40 min Charges:  OT General Charges $OT Visit: 1 Visit OT Evaluation $OT Eval High Complexity: 1 High OT Treatments $Self  Care/Home Management : 23-37 mins  Milburn Aliment, OT/L   Acute OT Clinical Specialist Acute Rehabilitation Services Pager [phone] Office [phone]   [facility] 10/02/2023, 11:41 AM

## 2023-10-02 NOTE — Progress Notes (Signed)
 5 Days Post-Op Procedure(s) (LRB): CORONARY ARTERY BYPASS GRAFTING (CABG) TIMES FOUR UTILIZING THE LEFT INTERNAL MAMMARY ARTERY AND ENDOSCOPIC VEIN HARVEST RIGHT GREATER SAPHENOUS VEIN (N/A) ECHOCARDIOGRAM, TRANSESOPHAGEAL, INTRAOPERATIVE (N/A) Subjective: Doesn't feel as well today C/o unable to get comfortable, denies pain Objective: Vital signs in last 24 hours: Temp:  [98 F (36.7 C)-99 F (37.2 C)] 98 F (36.7 C) (06/08 1930) Pulse Rate:  [83-124] 97 (06/09 0715) Cardiac Rhythm: Normal sinus rhythm;Bundle branch block (06/09 0400) Resp:  [7-26] 19 (06/09 0715) BP: (64-157)/(48-131) 100/77 (06/09 0715) SpO2:  [91 %-100 %] 98 % (06/09 0715) Weight:  [90.7 kg] 90.7 kg (06/09 0500)  Hemodynamic parameters for last 24 hours: CVP:  [6 mmHg-19 mmHg] 10 mmHg  Intake/Output from previous day: 06/08 0701 - 06/09 0700 In: 743.6 [P.O.:80; I.V.:231.6; NG/GT:165; IV Piggyback:50.1] Out: 3061 [Urine:135; Emesis/NG output:150] Intake/Output this shift: No intake/output data recorded.  General appearance: alert, cooperative, and mild distress Neurologic: intact Heart: regular rate and rhythm Lungs: diminished breath sounds bibasilar Abdomen: distended, hypoactive BS, nontender  Lab Results: Recent Labs    10/01/23 0500 10/02/23 0410  WBC 9.9 9.8  HGB 9.2* 9.2*  HCT 29.4* 30.1*  PLT 184 163   BMET:  Recent Labs    10/01/23 1604 10/02/23 0410  NA 138 138  K 3.9 3.8  CL 103 104  CO2 25 27  GLUCOSE 131* 54*  BUN 19 15  CREATININE 2.22* 1.90*  CALCIUM  7.6* 8.0*    PT/INR: No results for input(s): "LABPROT", "INR" in the last 72 hours. ABG    Component Value Date/Time   PHART 7.336 (L) 09/28/2023 0854   HCO3 20.5 09/28/2023 0854   TCO2 22 09/28/2023 0854   ACIDBASEDEF 5.0 (H) 09/28/2023 0854   O2SAT 66.4 10/02/2023 0410   CBG (last 3)  Recent Labs    10/01/23 2347 10/02/23 0408 10/02/23 0448  GLUCAP 91 50* 83    Assessment/Plan: S/P Procedure(s)  (LRB): CORONARY ARTERY BYPASS GRAFTING (CABG) TIMES FOUR UTILIZING THE LEFT INTERNAL MAMMARY ARTERY AND ENDOSCOPIC VEIN HARVEST RIGHT GREATER SAPHENOUS VEIN (N/A) ECHOCARDIOGRAM, TRANSESOPHAGEAL, INTRAOPERATIVE (N/A) POD # 4 NEURO- intact CV- in Sr, co-ox 66 on milrinone  0.125, epi @ 1  ASA, Plavix, statin, start beta blocker when BP allows RESP- on 4L [place]   Atelectasis, abdominal distention contributing RENAL- on CRRT  5L negative over the weekend  [facility] [place] ENDO-CBG relatively low, decrease long acting insulin  GI- +BM on Saturday after enema  Ileus persists- distention unchanged SCD + SQ heparin  for DVT prophylaxis  LOS: 11 days    [name] 10/02/2023

## 2023-10-02 NOTE — Progress Notes (Signed)
 Nutrition Follow-up  DOCUMENTATION CODES:   Obesity unspecified  INTERVENTION:   If unable to tolerate diet advancement and/or pt continues with minimal nutrition, recommend considering Cortrak placement on 6/11 (wed). Currently pt with inadequate nutrition since CABG on 6/04 (NPO or taking sips of CL only with +ileus)  Boost Breeze po TID, each supplement provides 250 kcal and 9 grams of protein. Recommend changing to Ensure Plus High Protein once diet advanced  30 ml ProSource Plus TID by mouth, each supplement provides 100 kcals and 15 grams protein.   Renal MVI daily  Tube Feeding Recommendations if Cortrak placed:  Initiate Vital 1.5 @ 25 ml/hr and increase by 10 ml every 8 hours to goal rate of 55 ml/hr 60 ml ProSrouce TF20 daily At goal provides: 2060 kcal, 109 grams protein, and 1003 ml free water   NUTRITION DIAGNOSIS:   Inadequate oral intake related to altered GI function as evidenced by NPO status.  Continues but being addressed  GOAL:   Patient will meet greater than or equal to 90% of their needs  Not Met  MONITOR:   Diet advancement, Supplement acceptance, I & O's  REASON FOR ASSESSMENT:   Consult Enteral/tube feeding initiation and management, Assessment of nutrition requirement/status  ASSESSMENT:   Pt with PMH of CAD, uncontrolled DM, HTN, CKD stage III, HLD, and chronic CHF who was admitted 5/29 for chest pain/acute NSTEMI.   5/29 - admitted with acute NSTEMI 6/04 - s/p CABG x 4; remained intubated post op  6/05 - extubated 6/06 - +ileus, unable to successfully advance Cortrak to stomach, discussed with Dr. Fulton Job and NG tube placed instead for decompression 6/07 - CRRT initiated 6/09 - NG removed, CL diet   Remains on CRRT. Sitting in Chair this morning,.  NG clamped yesterday and pt tolerated some CL po. NG removed this morning. Remains on CL diet; RN reports pt eating bites of broth, not taking in much  NPO or sips on CL Since CABG on  6/04: Inadequate x 5 days  Noted po intake prior to surgery 50-100%   Noted episode of hypoglycemia this AM  Labs: CBGs 73-150 Sodium 138 (wdl) Potassium 3.8 (wdl) BUN wdl Creatinine 1.90 Phosphorus 2.5 (wdl) Magnesium  2.8 (H)  Meds:  Reglan  SS novolog   Semglee    Diet Order:   Diet Order             Diet clear liquid Room service appropriate? Yes; Fluid consistency: Thin  Diet effective now                   EDUCATION NEEDS:   Education needs have been addressed  Skin:  Skin Assessment: Skin Integrity Issues: Skin Integrity Issues:: Incisions Incisions: R leg, chest  Last BM:  6/8 +multiple BMs s/p intervention for constipation, no BM yet today  Height:   Ht Readings from Last 1 Encounters:  09/27/23 5\' 4"  (1.626 m)    Weight:   Wt Readings from Last 1 Encounters:  10/02/23 (S) 90.7 kg    BMI:  Body mass index is 34.32 kg/m.  Estimated Nutritional Needs:   Kcal:  1900-2100  Protein:  100-115 grams  Fluid:  >1.9 L/day    Norvel Beer MS, RDN, LDN, CNSC Registered Dietitian 3 Clinical Nutrition RD Inpatient Contact Info in Amion

## 2023-10-02 NOTE — Progress Notes (Signed)
 NAME:  [name], MRN:  [id number], DOB:  11/09/1950, LOS: 11 ADMISSION DATE:  09/21/2023, CONSULTATION DATE: 09/28/2023 REFERRING MD: Dr. [name], CHIEF COMPLAINT: Status post CABG x 4  History of Present Illness:  73 year old male with coronary artery disease, diabetes type 2, hypertension, CKD stage IIIa, hyperlipidemia and chronic HFrEF who initially presented about a week ago with epigastric pain radiating to the chest, was admitted with acute NSTEMI, he underwent cardiac cath which showed severe multivessel coronary artery disease with elevated LVEDP.  Patient underwent CABG x 4, he had decreased urine output post CABG, he was not extubated per rapid weaning protocol, remain on vasopressor support with Levophed , epinephrine  and milrinone .  PCCM was consulted for help evaluation medical management  During my evaluation, patient is awake, following commands, tolerating spontaneous breathing trial.  Pertinent  Medical History   Past Medical History:  Diagnosis Date   CAD (coronary artery disease)    Diabetes mellitus without complication (HCC)    Hypercholesterolemia    Hypertension    MI (myocardial infarction) (HCC)    Renal insufficiency     Significant Hospital Events: Including procedures, antibiotic start and stop dates in addition to other pertinent events   5/29 admitted with acute NSTEMI 6/4 CABG x 4 6/5 consulted PCCM 6/7 start CRRT, bowel movements after enemas. Minimal NGT output.  6/8 foley removed, able to start mobility, started clear liquids  Interim History / Subjective:  Didn't sleep well last night. Having some mild back pain. Stood at bedside, pivoted to chair yesterday. Tolerated clear liquids. Some upper abdominal discomfort. Foley removed. Off epinephrine  this morning. Hypoglycemic this morning requiring dextrose .  Objective    Blood pressure 123/79, pulse (!) 103, temperature 98 F (36.7 C), temperature source Oral, resp. rate 14, height 5\' 4"  (1.626  m), weight (S) 90.7 kg, SpO2 100%. CVP:  [6 mmHg-19 mmHg] 10 mmHg      Intake/Output Summary (Last 24 hours) at 10/02/2023 0719 Last data filed at 10/02/2023 0700 Gross per 24 hour  Intake 743.64 ml  Output 3061 ml  Net -2317.36 ml   Filed Weights   09/30/23 0500 10/01/23 0500 10/02/23 0500  Weight: 106.4 kg 98.1 kg (S) 90.7 kg    Examination: General: chronically ill appearing man sitting up in the chair, more lethargic than yesterday HEENT [place]/AT, eyes anicteric Neuro: awake, able to answer questions and move all extremities Chest: breathing comfortably on [place], reduced basilar breath sounds.  Heart: S1S2, RRR Abdomen: distended, + bowel sounds Extremities: improving edema   I/O -2.3L, net -8.4L for admission (questionable since he was on the floor pre-op) UOP stopped  CVP ~7  Coox 66% BUN 15, Cr 1.9 on CRRT WBC 9.8 H/H  9.2/30.1 Platelets 163  BG 120s> 50s  Resolved problem list   Assessment and Plan  Multivessel coronary artery disease,  acute NSTEMI s/p CABG x 4 Post-bypass cardioplegia requiring inotropic support Acute on chronic HFrEF; EF 35 to 40% Post cardiotomy shock -con't milrinone ; off epi this morning. Recheck coox this afternoon -CRRT for volume management -DAPT, statin -pain control per protocol -advance mobility as tolerated; anticipate he may need rehab post-hospitalization. Wife indicated he had been slowing down prior to hospitalization due to SOB & CP.  -GDMT limited this time due to renal failure and ongoing shock  Acute pulmonary edema, acute respiratory failure with hypoxia -pulmonary hygiene -wean O2 -volume removal with CRRT  AKI on CKD stage IIIa, likely ATN -CRRT; appreciate nephrology -renally dose meds, avoid nephrotoxic  meds  H/o Hypertension -con't holding PTA antihypertensives  Hyperlipidemia -con't statin  Poorly- controlled diabetes type 2 with hyperglycemia- controlled; A1c 11.3 -decrease glargine to 20 units BID  -SSI  q4h PRN -goal BG <180  Expected perioperative blood loss anemia -transfuse for Hb <7 or hemodynamically significant bleeding  Acute metabolic encephalopathy likely due to renal failure; improved today -CRRT for metabolic clearance -PT, OT, OOB mobility as able -avoid renally clear pain meds -avoid sedatives -family visited over the weekend  Post-op ileus, improved constipation- resolved -con't clear liquids -can d/c NGT   Best Practice (right click and "Reselect all SmartList Selections" daily)   Diet/type: clear liquid DVT prophylaxis: [place] heparin  GI prophylaxis: PPI Lines: Central line, HD cath, Arterial Line, and yes and it is still needed Foley:  d/c 6/8 Code Status:  full code Last date of multidisciplinary goals of care discussion [6/7 with wife & son]   Labs   CBC: Recent Labs  Lab 09/29/23 0425 09/30/23 0516 09/30/23 1600 10/01/23 0500 10/02/23 0410  WBC 13.2* 12.6* 11.8* 9.9 9.8  HGB 9.7* 9.3* 8.8* 9.2* 9.2*  HCT 31.1* 30.8* 28.6* 29.4* 30.1*  MCV 92.3 92.8 92.9 91.6 92.0  PLT 152 165 149* 184 163    Basic Metabolic Panel: Recent Labs  Lab 09/28/23 0415 09/28/23 0854 09/28/23 1559 09/29/23 0425 09/30/23 0516 09/30/23 1600 10/01/23 0500 10/01/23 1604 10/02/23 0410  NA 140   < > 140   < > 137 137 138 138 138  K 4.4   < > 4.9   < > 4.5 4.0 4.2 3.9 3.8  CL 109  --  103   < > 104 105 105 103 104  CO2 22  --  21*   < > 20* 21* 23 25 27   GLUCOSE 129*  --  163*   < > 95 116* 116* 131* 54*  BUN 20  --  24*   < > 38* 36* 25* 19 15  CREATININE 2.36*  --  2.87*   < > 4.79* 3.94* 2.78* 2.22* 1.90*  CALCIUM  7.9*  --  8.1*   < > 7.9* 8.0* 7.9* 7.6* 8.0*  MG 3.4*  --  3.2*  --   --  2.9* 1.5*  --  2.8*  PHOS  --   --   --   --   --  4.4 3.7 2.4* 2.5   < > = values in this interval not displayed.    This patient is critically ill with multiple organ system failure which requires frequent high complexity decision making, assessment, support, evaluation,  and titration of therapies. This was completed through the application of advanced monitoring technologies and extensive interpretation of multiple databases. During this encounter critical care time was devoted to patient care services described in this note for 37  minutes.  [name], DO 10/02/23 7:52 AM [facility]  For contact information, see Amion. If no response to pager, please call PCCM consult pager. After hours, 7PM- 7AM, please call Elink.

## 2023-10-02 NOTE — Progress Notes (Signed)
 Inpatient Rehab Admissions Coordinator:   Consult received and chart reviewed.  Note epi stopped this AM, still on CRRT.  Will follow for transition off CRRT and then follow up at bedside for CIR assessment.   Loye Rumble, PT, DPT Admissions Coordinator [phone] 10/02/23  9:49 AM

## 2023-10-02 NOTE — Progress Notes (Signed)
      [address]       [place] [address]             [phone]       Sleeping currently  BP 96/64   Pulse (!) 110   Temp (!) 97.4 F (36.3 C) (Axillary)   Resp 14   Ht 5\' 4"  (1.626 m)   Wt (S) 90.7 kg   SpO2 96%   BMI 34.32 kg/m  CVp 10 Milrinone  0.125  Intake/Output Summary (Last 24 hours) at 10/02/2023 1648 Last data filed at 10/02/2023 1622 Gross per 24 hour  Intake 895.63 ml  Output 2906.8 ml  Net -2011.17 ml   Continue CRRT, if BP becomes problematic will run even  Landon Pinion C. [name], MD [facility] [phone]

## 2023-10-03 ENCOUNTER — Inpatient Hospital Stay (HOSPITAL_COMMUNITY)

## 2023-10-03 DIAGNOSIS — I5023 Acute on chronic systolic (congestive) heart failure: Secondary | ICD-10-CM | POA: Diagnosis not present

## 2023-10-03 DIAGNOSIS — Z452 Encounter for adjustment and management of vascular access device: Secondary | ICD-10-CM | POA: Diagnosis not present

## 2023-10-03 DIAGNOSIS — N1831 Chronic kidney disease, stage 3a: Secondary | ICD-10-CM | POA: Diagnosis not present

## 2023-10-03 DIAGNOSIS — R570 Cardiogenic shock: Secondary | ICD-10-CM | POA: Diagnosis not present

## 2023-10-03 DIAGNOSIS — J811 Chronic pulmonary edema: Secondary | ICD-10-CM | POA: Diagnosis not present

## 2023-10-03 DIAGNOSIS — I214 Non-ST elevation (NSTEMI) myocardial infarction: Secondary | ICD-10-CM | POA: Diagnosis not present

## 2023-10-03 DIAGNOSIS — I517 Cardiomegaly: Secondary | ICD-10-CM | POA: Diagnosis not present

## 2023-10-03 LAB — GLUCOSE, CAPILLARY
Glucose-Capillary: 149 mg/dL — ABNORMAL HIGH (ref 70–99)
Glucose-Capillary: 155 mg/dL — ABNORMAL HIGH (ref 70–99)
Glucose-Capillary: 234 mg/dL — ABNORMAL HIGH (ref 70–99)
Glucose-Capillary: 260 mg/dL — ABNORMAL HIGH (ref 70–99)
Glucose-Capillary: 78 mg/dL (ref 70–99)
Glucose-Capillary: 94 mg/dL (ref 70–99)

## 2023-10-03 LAB — CBC
HCT: 31.9 % — ABNORMAL LOW (ref 39.0–52.0)
Hemoglobin: 10.1 g/dL — ABNORMAL LOW (ref 13.0–17.0)
MCH: 28.4 pg (ref 26.0–34.0)
MCHC: 31.7 g/dL (ref 30.0–36.0)
MCV: 89.6 fL (ref 80.0–100.0)
Platelets: 195 10*3/uL (ref 150–400)
RBC: 3.56 MIL/uL — ABNORMAL LOW (ref 4.22–5.81)
RDW: 13.7 % (ref 11.5–15.5)
WBC: 12.3 10*3/uL — ABNORMAL HIGH (ref 4.0–10.5)
nRBC: 0.4 % — ABNORMAL HIGH (ref 0.0–0.2)

## 2023-10-03 LAB — RENAL FUNCTION PANEL
Albumin: 2.5 g/dL — ABNORMAL LOW (ref 3.5–5.0)
Albumin: 2.6 g/dL — ABNORMAL LOW (ref 3.5–5.0)
Anion gap: 10 (ref 5–15)
Anion gap: 13 (ref 5–15)
BUN: 13 mg/dL (ref 8–23)
BUN: 18 mg/dL (ref 8–23)
CO2: 24 mmol/L (ref 22–32)
CO2: 22 mmol/L (ref 22–32)
Calcium: 8.1 mg/dL — ABNORMAL LOW (ref 8.9–10.3)
Calcium: 8.1 mg/dL — ABNORMAL LOW (ref 8.9–10.3)
Chloride: 102 mmol/L (ref 98–111)
Chloride: 99 mmol/L (ref 98–111)
Creatinine, Ser: 1.75 mg/dL — ABNORMAL HIGH (ref 0.61–1.24)
Creatinine, Ser: 1.85 mg/dL — ABNORMAL HIGH (ref 0.61–1.24)
GFR, Estimated: 41 mL/min — ABNORMAL LOW (ref >60)
GFR, Estimated: 38 mL/min — ABNORMAL LOW (ref >60)
Glucose, Bld: 77 mg/dL (ref 70–99)
Glucose, Bld: 244 mg/dL — ABNORMAL HIGH (ref 70–99)
Phosphorus: 3 mg/dL (ref 2.5–4.6)
Phosphorus: 2.6 mg/dL (ref 2.5–4.6)
Potassium: 3.8 mmol/L (ref 3.5–5.1)
Potassium: 3.9 mmol/L (ref 3.5–5.1)
Sodium: 136 mmol/L (ref 135–145)
Sodium: 134 mmol/L — ABNORMAL LOW (ref 135–145)

## 2023-10-03 LAB — COOXEMETRY PANEL
Carboxyhemoglobin: 0.9 % (ref 0.5–1.5)
Carboxyhemoglobin: 1.2 % (ref 0.5–1.5)
Methemoglobin: <0.7 % (ref 0.0–1.5)
Methemoglobin: <0.7 % (ref 0.0–1.5)
O2 Saturation: 53.1 %
O2 Saturation: 52.3 %
Total hemoglobin: 10.4 g/dL — ABNORMAL LOW (ref 12.0–16.0)
Total hemoglobin: 10.2 g/dL — ABNORMAL LOW (ref 12.0–16.0)

## 2023-10-03 LAB — APTT
aPTT: 113 s — ABNORMAL HIGH (ref 24–36)
aPTT: 140 s — ABNORMAL HIGH (ref 24–36)

## 2023-10-03 LAB — MAGNESIUM: Magnesium: 2.6 mg/dL — ABNORMAL HIGH (ref 1.7–2.4)

## 2023-10-03 MED ORDER — INSULIN ASPART 100 UNIT/ML IJ SOLN
0.0000 [IU] | INTRAMUSCULAR | Status: DC
  Administered 2023-10-03: 1 [IU] via SUBCUTANEOUS
  Administered 2023-10-03: 3 [IU] via SUBCUTANEOUS
  Administered 2023-10-03: 2 [IU] via SUBCUTANEOUS
  Administered 2023-10-03: 5 [IU] via SUBCUTANEOUS
  Administered 2023-10-04: 1 [IU] via SUBCUTANEOUS
  Administered 2023-10-04: 3 [IU] via SUBCUTANEOUS
  Administered 2023-10-04: 1 [IU] via SUBCUTANEOUS
  Administered 2023-10-04: 2 [IU] via SUBCUTANEOUS

## 2023-10-03 MED ORDER — TRAZODONE HCL 50 MG PO TABS
50.0000 mg | ORAL_TABLET | Freq: Every day | ORAL | Status: DC
  Administered 2023-10-03 – 2023-10-12 (×10): 50 mg via ORAL
  Filled 2023-10-03 (×11): qty 1

## 2023-10-03 NOTE — Progress Notes (Signed)
 EVENING ROUNDS NOTE :     [address]       [place] [address]             [phone]                 6 Days Post-Op Procedure(s) (LRB): CORONARY ARTERY BYPASS GRAFTING (CABG) TIMES FOUR UTILIZING THE LEFT INTERNAL MAMMARY ARTERY AND ENDOSCOPIC VEIN HARVEST RIGHT GREATER SAPHENOUS VEIN (N/A) ECHOCARDIOGRAM, TRANSESOPHAGEAL, INTRAOPERATIVE (N/A)   Total Length of Stay:  LOS: 12 days  Events:   No events Removing fluid Stable day    BP 118/71   Pulse (!) 116   Temp 98.8 F (37.1 C) (Axillary)   Resp 20   Ht 5\' 4"  (1.626 m)   Wt 95.6 kg   SpO2 98%   BMI 36.18 kg/m   CVP:  [0 mmHg-20 mmHg] 0 mmHg      heparin  10,000 units/ 20 mL infusion syringe 250 Units/hr (10/03/23 1350)   milrinone  0.125 mcg/kg/min (10/03/23 1700)   prismasol BGK 4/2.5 1,500 mL/hr at 10/03/23 1725   prismasol BGK 4/2.5 400 mL/hr at 10/03/23 0700   prismasol BGK 4/2.5 400 mL/hr at 10/03/23 0700    I/O last 3 completed shifts: In: 1401.5 [P.O.:940; I.V.:203.7; IV Piggyback:257.8] Out: 4667.2 [Emesis/NG output:150]      Latest Ref Rng & Units 10/03/2023    5:00 AM 10/02/2023    4:10 AM 10/01/2023    5:00 AM  CBC  WBC 4.0 - 10.5 K/uL 12.3  9.8  9.9   Hemoglobin 13.0 - 17.0 g/dL 46.9  9.2  9.2   Hematocrit 39.0 - 52.0 % 31.9  30.1  29.4   Platelets 150 - 400 K/uL 195  163  184        Latest Ref Rng & Units 10/03/2023    5:00 AM 10/02/2023    4:00 PM 10/02/2023    4:10 AM  BMP  Glucose 70 - 99 mg/dL 77  629  54   BUN 8 - 23 mg/dL 13  15  15    Creatinine 0.61 - 1.24 mg/dL 5.28  4.13  2.44   Sodium 135 - 145 mmol/L 136  131  138   Potassium 3.5 - 5.1 mmol/L 3.8  4.0  3.8   Chloride 98 - 111 mmol/L 102  100  104   CO2 22 - 32 mmol/L 24  25  27    Calcium  8.9 - 10.3 mg/dL 8.1  7.4  8.0     ABG    Component Value Date/Time   PHART 7.336 (L) 09/28/2023 0854   PCO2ART 38.4 09/28/2023 0854   PO2ART 87 09/28/2023 0854   HCO3 20.5 09/28/2023 0854   TCO2 22 09/28/2023 0854    ACIDBASEDEF 5.0 (H) 09/28/2023 0854   O2SAT 52.3 10/03/2023 1056       Starleen Eastern, MD 10/03/2023 5:48 PM

## 2023-10-03 NOTE — Progress Notes (Signed)
 NAME:  [name], MRN:  [id number], DOB:  February 22, 1951, LOS: 12 ADMISSION DATE:  09/21/2023, CONSULTATION DATE: 09/28/2023 REFERRING MD: Dr. [name], CHIEF COMPLAINT: Status post CABG x 4  History of Present Illness:  73 year old male with coronary artery disease, diabetes type 2, hypertension, CKD stage IIIa, hyperlipidemia and chronic HFrEF who initially presented about a week ago with epigastric pain radiating to the chest, was admitted with acute NSTEMI, he underwent cardiac cath which showed severe multivessel coronary artery disease with elevated LVEDP.  Patient underwent CABG x 4, he had decreased urine output post CABG, he was not extubated per rapid weaning protocol, remain on vasopressor support with Levophed , epinephrine  and milrinone .  PCCM was consulted for help evaluation medical management  During my evaluation, patient is awake, following commands, tolerating spontaneous breathing trial.  Pertinent  Medical History   Past Medical History:  Diagnosis Date   CAD (coronary artery disease)    Diabetes mellitus without complication (HCC)    Hypercholesterolemia    Hypertension    MI (myocardial infarction) (HCC)    Renal insufficiency     Significant Hospital Events: Including procedures, antibiotic start and stop dates in addition to other pertinent events   5/29 admitted with acute NSTEMI 6/4 CABG x 4 6/5 consulted PCCM 6/7 start CRRT, bowel movements after enemas. Minimal NGT output.  6/8 foley removed, able to start mobility, started clear liquids 6/10 d/c RIJ introducer, con't milrinone .  Interim History / Subjective:  Slept during the day yesterday, has been awake overnight.  Afebrile. Remains on milrinone  0.125, off epi since yesterday.  Objective    Blood pressure 120/78, pulse (!) 112, temperature 97.7 F (36.5 C), temperature source Axillary, resp. rate (!) 24, height 5\' 4"  (1.626 m), weight 95.6 kg, SpO2 95%. CVP:  [2 mmHg-31 mmHg] 9 mmHg       Intake/Output Summary (Last 24 hours) at 10/03/2023 0730 Last data filed at 10/03/2023 0700 Gross per 24 hour  Intake 1211.9 ml  Output 3208.2 ml  Net -1996.3 ml   Filed Weights   10/01/23 0500 10/02/23 0500 10/03/23 0415  Weight: 98.1 kg (S) 97 kg 95.6 kg    Examination: General: chronically ill appearing man lying in bed in NAD HEENT [place]/AT, eyes anicteric Neuro: awake, alert, moving all extremities but globally weak Chest: breathing comfortably on [place], reduced basilar breath sounds Heart: S1S2, RRR Abdomen: still distended, NT Extremities: improving but persistent edema   I/O -2L, net -10.4L for admission (questionable since he was on the floor pre-op) UOP stopped  Coox 53% BUN 13, Cr 1.75 on CRRT WBC 12.3 H/H  10.1/31.9 Platelets 195  BG 120s> 50s  CXR personally reviewed> low lung volumes, no infiltrates, RIJ introducer and LIJ HD  Resolved problem list   Assessment and Plan  Multivessel coronary artery disease,  acute NSTEMI s/p CABG x 4 Post-bypass cardioplegia requiring inotropic support Acute on chronic HFrEF; EF 35 to 40% Post cardiotomy shock -con't milrinone  0.125; repeat coox now -CRRT for another day, hopefully can switch to iHD later this week -DAPT, statin -pain control per protocol -advance mobility; PT, OT. Up on chair.  -GDMT limited this time due to renal failure and ongoing shock. Appreciate AHF's management.   Acute pulmonary edema, acute respiratory failure with hypoxia -optimize volume with CRRT -wean O2 as able -OOB mobility, pulmonary hygiene  Leukocytosis; CXR reassuring no pneumonia -monitor for fevers; not able to get PICC to remove RIJ CVC yet. Remove RIJ introducer today and transduce CVPs  off pigtail on HD catheter.  AKI on CKD stage IIIa, likely ATN -renally dose meds, avoid nephrotoxic meds -strict I/O -CRRT for another day, then hopefully transition to iHD  H/o Hypertension -con't holding PTA  antihypertensives  Hyperlipidemia -statin  Poorly-controlled diabetes type 2 with hyperglycemia- controlled; A1c 11.3 -stop glargine -SSI PRN -goal BG 140-180  Expected perioperative blood loss anemia -transfuse for Hb <7 or hemodynamically significant bleeding  Acute metabolic encephalopathy likely due to renal failure; improved today Insomnia -CRRT for metabolic clearance -trazodone at bedtime -needs to reverse back to normal sleep-wake cycle -OOB mobility, PT, OT  Post-op ileus, improved constipation- resolved -advancing diet -con't bowel regimen -walk   Best Practice (right click and "Reselect all SmartList Selections" daily)   Diet/type: renal diet DVT prophylaxis: [place] heparin  GI prophylaxis: PPI Lines: HD cath, Arterial Line. D/c introducer today Foley:  d/c 6/8 Code Status:  full code Last date of multidisciplinary goals of care discussion [6/7 with wife & son]   Labs   CBC: Recent Labs  Lab 09/30/23 0516 09/30/23 1600 10/01/23 0500 10/02/23 0410 10/03/23 0500  WBC 12.6* 11.8* 9.9 9.8 12.3*  HGB 9.3* 8.8* 9.2* 9.2* 10.1*  HCT 30.8* 28.6* 29.4* 30.1* 31.9*  MCV 92.8 92.9 91.6 92.0 89.6  PLT 165 149* 184 163 195    Basic Metabolic Panel: Recent Labs  Lab 09/28/23 1559 09/29/23 0425 09/30/23 1600 10/01/23 0500 10/01/23 1604 10/02/23 0410 10/02/23 1600 10/03/23 0500  NA 140   < > 137 138 138 138 131* 136  K 4.9   < > 4.0 4.2 3.9 3.8 4.0 3.8  CL 103   < > 105 105 103 104 100 102  CO2 21*   < > 21* 23 25 27 25 24   GLUCOSE 163*   < > 116* 116* 131* 54* 230* 77  BUN 24*   < > 36* 25* 19 15 15 13   CREATININE 2.87*   < > 3.94* 2.78* 2.22* 1.90* 1.83* 1.75*  CALCIUM  8.1*   < > 8.0* 7.9* 7.6* 8.0* 7.4* 8.1*  MG 3.2*  --  2.9* 1.5*  --  2.8*  --  2.6*  PHOS  --    < > 4.4 3.7 2.4* 2.5 2.3* 3.0   < > = values in this interval not displayed.    This patient is critically ill with multiple organ system failure which requires frequent high complexity  decision making, assessment, support, evaluation, and titration of therapies. This was completed through the application of advanced monitoring technologies and extensive interpretation of multiple databases. During this encounter critical care time was devoted to patient care services described in this note for 40  minutes.  [name], DO 10/03/23 11:44 AM  [facility]  For contact information, see Amion. If no response to pager, please call PCCM consult pager. After hours, 7PM- 7AM, please call Elink.

## 2023-10-03 NOTE — Progress Notes (Signed)
 6 Days Post-Op Procedure(s) (LRB): CORONARY ARTERY BYPASS GRAFTING (CABG) TIMES FOUR UTILIZING THE LEFT INTERNAL MAMMARY ARTERY AND ENDOSCOPIC VEIN HARVEST RIGHT GREATER SAPHENOUS VEIN (N/A) ECHOCARDIOGRAM, TRANSESOPHAGEAL, INTRAOPERATIVE (N/A) Subjective: C/o back pain, "can't get comfortable"  Objective: Vital signs in last 24 hours: Temp:  [97.4 F (36.3 C)-98.7 F (37.1 C)] 97.6 F (36.4 C) (06/10 0700) Pulse Rate:  [93-148] 112 (06/10 0400) Cardiac Rhythm: Sinus tachycardia (06/10 0400) Resp:  [0-24] 24 (06/10 0700) BP: (80-169)/(51-126) 120/78 (06/10 0700) SpO2:  [64 %-100 %] 95 % (06/10 0500) Weight:  [95.6 kg] 95.6 kg (06/10 0415)  Hemodynamic parameters for last 24 hours: CVP:  [2 mmHg-31 mmHg] 9 mmHg  Intake/Output from previous day: 06/09 0701 - 06/10 0700 In: 1211.9 [P.O.:860; I.V.:94.1; IV Piggyback:257.8] Out: 3208.2  Intake/Output this shift: No intake/output data recorded.  General appearance: alert, cooperative, and no distress Neurologic: intact Heart: tachy. regular Lungs: diminished breath sounds bibasilar Abdomen: mildly distended, nontender, + BS Wound: minimal drainage mid wound, no erythema  Lab Results: Recent Labs    10/02/23 0410 10/03/23 0500  WBC 9.8 12.3*  HGB 9.2* 10.1*  HCT 30.1* 31.9*  PLT 163 195   BMET:  Recent Labs    10/02/23 1600 10/03/23 0500  NA 131* 136  K 4.0 3.8  CL 100 102  CO2 25 24  GLUCOSE 230* 77  BUN 15 13  CREATININE 1.83* 1.75*  CALCIUM  7.4* 8.1*    PT/INR: No results for input(s): "LABPROT", "INR" in the last 72 hours. ABG    Component Value Date/Time   PHART 7.336 (L) 09/28/2023 0854   HCO3 20.5 09/28/2023 0854   TCO2 22 09/28/2023 0854   ACIDBASEDEF 5.0 (H) 09/28/2023 0854   O2SAT 53.1 10/03/2023 0500   CBG (last 3)  Recent Labs    10/02/23 2350 10/03/23 0507 10/03/23 0737  GLUCAP 127* 78 94    Assessment/Plan: S/P Procedure(s) (LRB): CORONARY ARTERY BYPASS GRAFTING (CABG) TIMES FOUR  UTILIZING THE LEFT INTERNAL MAMMARY ARTERY AND ENDOSCOPIC VEIN HARVEST RIGHT GREATER SAPHENOUS VEIN (N/A) ECHOCARDIOGRAM, TRANSESOPHAGEAL, INTRAOPERATIVE (N/A) POD # 6 NEURO- intact, intermittent delirium, sleep wake cycle reversed Cv- on milrinone  0.125  CVP 6-10, co-ox 53 this AM  On aspirin , Plavix, atorvastatin  RESP- CXR shows improved aeration at bases  Continue IS RENAL- oliguric acute on chronic renal failure remains on CRRT with -100 ml/ hr  7L negative over past 3 days  ? Change to intermittent HD ENDO- CBG controlled on [place]  + SSI GI- ileus resolving  BM x 3 yesterday  Will try renal diet today ID- no issues at present Deconditioning- severe, PT/OT  LOS: 12 days    [name] 10/03/2023

## 2023-10-03 NOTE — Progress Notes (Signed)
 Physical Therapy Treatment Patient Details Name: [name] MRN: [id number] DOB: [birth year] Today's Date: 10/03/2023   History of Present Illness 73 y.o. male presenting 09/21/23 with chest pain and elevated troponins. NSTEMI; 5/29 L heart cath with multivessel CAD, 6/04 CABGx4; 6/5 extubated; post-CABG cardioplegia requiring inotropic support; decr renal function with uremic encephalopathy, CRRT started 6/7 PMH: DM, MI, HTN    PT Comments  Patient up in recliner on arrival. Agreed to exercises including sit to stand. See below for details. Patient tolerated well. Cognition continues to be impaired with delayed processing, decr attention, and decr initiation.     If plan is discharge home, recommend the following: Two people to help with walking and/or transfers;Two people to help with bathing/dressing/bathroom;Direct supervision/assist for medications management;Direct supervision/assist for financial management;Assist for transportation;Help with stairs or ramp for entrance;Supervision due to cognitive status   Can travel by private vehicle        Equipment Recommendations  Rolling walker (2 wheels)    Recommendations for Other Services       Precautions / Restrictions Precautions Precautions: Sternal;Fall Precaution Booklet Issued: No Recall of Precautions/Restrictions: Impaired Precaution/Restrictions Comments: educated on precautions and required max cues to adhere; CRRT L internal jugular     Mobility  Bed Mobility               General bed mobility comments: pt up to chair with nursing via stedy    Transfers Overall transfer level: Needs assistance Equipment used: None Transfers: Sit to/from Stand Sit to Stand: Mod assist           General transfer comment: using momentum and hands on knees, stood from recliner x 2; pt with posterior lean and unable to self-correct and resisted attempts to wt-shift forward over his feet    Ambulation/Gait                General Gait Details: unable to progress his feet   Stairs             Wheelchair Mobility     Tilt Bed    Modified Rankin (Stroke Patients Only)       Balance Overall balance assessment: Needs assistance Sitting-balance support: No upper extremity supported, Feet supported Sitting balance-Leahy Scale: Fair     Standing balance support: No upper extremity supported, During functional activity Standing balance-Leahy Scale: Poor                              Communication Communication Communication: No apparent difficulties  Cognition Arousal: Alert Behavior During Therapy: WFL for tasks assessed/performed   PT - Cognitive impairments: Attention, Initiation, Sequencing                       PT - Cognition Comments: required max cues to adhere to sternal precautions; difficulty attending to LE exercises--required max cues and demonstration to continue reps Following commands: Impaired Following commands impaired: Follows one step commands with increased time    Cueing Cueing Techniques: Verbal cues, Tactile cues  Exercises General Exercises - Lower Extremity Ankle Circles/Pumps: AROM, Both, 10 reps, Seated Long Arc Quad: AROM, Both, 10 reps, Seated Hip ABduction/ADduction: AROM, Both, 10 reps, Seated Hip Flexion/Marching: AROM, Both, 10 reps, Seated    General Comments General comments (skin integrity, edema, etc.): on CRRT with RN present      Pertinent Vitals/Pain Pain Assessment Pain Assessment: No/denies pain    Home Living Family/patient  expects to be discharged to:: Private residence Living Arrangements: Spouse/significant other Available Help at Discharge: Family;Available 24 hours/day Type of Home: House Home Access: Stairs to enter Entrance Stairs-Rails: Doctor, general practice of Steps: 3   Home Layout: One level Home Equipment: None      Prior Function            PT Goals (current goals can now be  found in the care plan section) Acute Rehab PT Goals Patient Stated Goal: agrees he wants to get moving Time For Goal Achievement: 10/15/23 Potential to Achieve Goals: Good Progress towards PT goals: Progressing toward goals    Frequency    Min 2X/week      PT Plan      Co-evaluation              AM-PAC PT "6 Clicks" Mobility   Outcome Measure  Help needed turning from your back to your side while in a flat bed without using bedrails?: Total Help needed moving from lying on your back to sitting on the side of a flat bed without using bedrails?: Total Help needed moving to and from a bed to a chair (including a wheelchair)?: Total Help needed standing up from a chair using your arms (e.g., wheelchair or bedside chair)?: A Lot Help needed to walk in hospital room?: Total Help needed climbing 3-5 steps with a railing? : Total 6 Click Score: 7    End of Session   Activity Tolerance: Patient tolerated treatment well Patient left: in chair;with call bell/phone within reach;with nursing/sitter in room Nurse Communication: Mobility status;Need for lift equipment;Other (comment) (RN present during session) PT Visit Diagnosis: Unsteadiness on feet (R26.81);Muscle weakness (generalized) (M62.81);Difficulty in walking, not elsewhere classified (R26.2)     Time: 1191-4782 PT Time Calculation (min) (ACUTE ONLY): 17 min  Charges:    $Therapeutic Exercise: 8-22 mins PT General Charges $$ ACUTE PT VISIT: 1 Visit                      Gayle Kava, PT Acute Rehabilitation Services  Office [phone]    [place] Leep 10/03/2023, 4:41 PM

## 2023-10-03 NOTE — Progress Notes (Signed)
 Patient ID: [name], male   DOB: 21-Oct-1950, 73 y.o.   MRN: [id number] [facility] Progress Note   Assessment/ Plan:    1.  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  Developed nonoliguric postoperative acute kidney injury likely from ischemic ATN given difficulties of weaning from cardiopulmonary bypass.  Started on CRRT on 6/7 for management of volume status (fixed urine output on high-dose furosemide ) and azotemia with mental status improving after CRRT per charting  ------------------ - Continue CRRT.  UF goal 100 ml/hr as tolerated    2.  NSTEMI: With multivessel coronary artery disease and underwent four-vessel CABG on 09/27/2023.  Postoperative course significant for difficulty with getting him off cardiopulmonary bypass and developed postoperative shock with pressor/inotrope support. - post-operative care per CT surgery   3.  Postoperative shock: Previously on norepinephrine , epinephrine  (now off) and on milrinone  for RV support  - milrinone  per CHF team  4.  Normocytic Anemia: Likely associated with surgical/postoperative blood loss.  - PRBC's per primary team  - Defer ESA for now with recent NSTEMI  5. CKD stage 3a - note baseline Cr 1.3 - 1.6   Disposition - in ICU on CRRT     Subjective:   He has been on CRRT.  Seen and examined on CRRT; procedure supervised.  Left internal jugular nontunneled catheter in use.  There is no UOP over 6/9 charted.  He had 3.0 L UF over 6/9 with CRRT as charted thus far.  He feels ok.  Per nurse his diet was advanced to clears yesterday.  He had a good night. No longer on epi.  Still on milrinone .    Review of systems:    Denies n/v; abdominal distention; had a BM overnight and a couple during the day yesterday as well per nursing  Denies shortness of breath or chest pain   Objective:   BP 117/77   Pulse (!) 112   Temp 97.7 F (36.5 C)  (Axillary)   Resp 13   Ht 5\' 4"  (1.626 m)   Wt 95.6 kg   SpO2 95%   BMI 36.18 kg/m   Intake/Output Summary (Last 24 hours) at 10/03/2023 0553 Last data filed at 10/03/2023 0500 Gross per 24 hour  Intake 968.85 ml  Output 3044.2 ml  Net -2075.35 ml   Weight change: -1.4 kg  Physical Exam:    General adult male in chair in no acute distress HEENT normocephalic atraumatic extraocular movements intact sclera anicteric Neck supple trachea midline Lungs clear to auscultation bilaterally normal work of breathing at rest on 3 liters; midline sternal incision  Heart S1S2 Abdomen soft nontender distended Extremities trace edema  Psych normal mood and affect; no anxiety or agitation  Neuro awake and conversant; follows basic commands and provides hx Access: left internal jugular nontunneled dialysis catheter    Imaging: No results found.   Labs: BMET Recent Labs  Lab 09/30/23 0516 09/30/23 1600 10/01/23 0500 10/01/23 1604 10/02/23 0410 10/02/23 1600 10/03/23 0500  NA 137 137 138 138 138 131* 136  K 4.5 4.0 4.2 3.9 3.8 4.0 3.8  CL 104 105 105 103 104 100 102  CO2 20* 21* 23 25 27 25 24   GLUCOSE 95 116* 116* 131* 54* 230* 77  BUN 38* 36* 25* 19 15 15 13   CREATININE 4.79* 3.94* 2.78* 2.22* 1.90* 1.83* 1.75*  CALCIUM  7.9* 8.0* 7.9* 7.6* 8.0* 7.4* 8.1*  PHOS  --  4.4 3.7 2.4* 2.5 2.3* 3.0   CBC Recent Labs  Lab 09/30/23 1600 10/01/23 0500 10/02/23 0410 10/03/23 0500  WBC 11.8* 9.9 9.8 12.3*  HGB 8.8* 9.2* 9.2* 10.1*  HCT 28.6* 29.4* 30.1* 31.9*  MCV 92.9 91.6 92.0 89.6  PLT 149* 184 163 195    Medications:     (feeding supplement) PROSource Plus  30 mL Oral TID WC   aspirin   81 mg Oral Daily   atorvastatin   80 mg Oral Daily   bisacodyl   10 mg Oral Daily   Or   bisacodyl   10 mg Rectal Daily   Chlorhexidine  Gluconate Cloth  6 each Topical Daily   clopidogrel  75 mg Oral Daily   docusate  200 mg Oral Daily   feeding supplement  1 Container Oral TID BM    heparin  injection (subcutaneous)  5,000 Units Subcutaneous Q8H   insulin  aspart  0-15 Units Subcutaneous Q4H   insulin  glargine-yfgn  20 Units Subcutaneous BID   metoCLOPramide  (REGLAN ) injection  10 mg Intravenous Q8H   multivitamin  1 tablet Oral QHS   pantoprazole  (PROTONIX ) IV  40 mg Intravenous Daily   sodium chloride  flush  3 mL Intravenous Q12H    Nan Aver, MD 10/03/2023, 6:10 AM

## 2023-10-03 NOTE — Progress Notes (Cosign Needed)
 Patient ID: Jerremy Maione, male   DOB: Dec 22, 1950, 73 y.o.   MRN: [id number]     Advanced Heart Failure Rounding Note  Cardiologist: Hazle Lites, MD   Chief Complaint: Post cardiotomy shock  Patient Profile   73 y.o. male with history of CAD, HFmrEF, uncontrolled DM II, HTN, HLD. Admitted with NSTEMI and acute on chronic CHF. Underwent CABG X 4 on 09/27/23 c/b postcardiotomy shock.  Subjective:    06/04: CABG X 4 with Dr. [name]. Difficulty separating from cardiopulmonary bypass 06/05: Extubated   POD #6  On milrinone  0.125. Co-ox 52%. Remains anuric. On CRRT, currently pulling 100 cc/hr/ CVP 8.  Currently working w/ PT. No current complaints. Denies CP and dyspnea.    Objective:    Weight Range: 95.6 kg Body mass index is 36.18 kg/m.   Vital Signs:   Temp:  [97.6 F (36.4 C)-98.7 F (37.1 C)] 97.6 F (36.4 C) (06/10 0700) Pulse Rate:  [99-159] 110 (06/10 1500) Resp:  [8-24] 14 (06/10 1500) BP: (82-169)/(52-126) 124/68 (06/10 1500) SpO2:  [78 %-100 %] 98 % (06/10 1500) Weight:  [95.6 kg] 95.6 kg (06/10 0415) Last BM Date : 10/02/23  Weight change: Filed Weights   10/01/23 0500 10/02/23 0500 10/03/23 0415  Weight: 98.1 kg (S) 97 kg 95.6 kg   Intake/Output:  Intake/Output Summary (Last 24 hours) at 10/03/2023 1501 Last data filed at 10/03/2023 1500 Gross per 24 hour  Intake 1261.5 ml  Output 3172.9 ml  Net -1911.4 ml    Physical Exam    CVP 6 General: weak appearing, no respiratory distress  HEENT: normal Neck: RIJ CVC, LIJ HD cath  Cor: PMI nondisplaced. Regular rate & rhythm.  Lungs: decreased BS at the bases bilaterally  Abdomen: soft, nontender, nondistended.  Extremities: no cyanosis, clubbing, rash, 1-2+ b/l LEE edema Neuro: alert & oriented x 3, cranial nerves grossly intact. moves all 4 extremities w/o difficulty. Affect pleasant.   Labs    CBC Recent Labs    10/02/23 0410 10/03/23 0500  WBC 9.8 12.3*  HGB 9.2* 10.1*  HCT 30.1*  31.9*  MCV 92.0 89.6  PLT 163 195   Basic Metabolic Panel Recent Labs    95/62/13 0410 10/02/23 1600 10/03/23 0500  NA 138 131* 136  K 3.8 4.0 3.8  CL 104 100 102  CO2 27 25 24   GLUCOSE 54* 230* 77  BUN 15 15 13   CREATININE 1.90* 1.83* 1.75*  CALCIUM  8.0* 7.4* 8.1*  MG 2.8*  --  2.6*  PHOS 2.5 2.3* 3.0   Liver Function Tests Recent Labs    10/02/23 1600 10/03/23 0500  ALBUMIN  2.2* 2.5*   BNP (last 3 results) Recent Labs    09/21/23 0806  BNP 128.5*   Medications:    Scheduled Medications:  (feeding supplement) PROSource Plus  30 mL Oral TID WC   aspirin   81 mg Oral Daily   atorvastatin   80 mg Oral Daily   bisacodyl   10 mg Oral Daily   Or   bisacodyl   10 mg Rectal Daily   Chlorhexidine  Gluconate Cloth  6 each Topical Daily   clopidogrel  75 mg Oral Daily   docusate  200 mg Oral Daily   feeding supplement  1 Container Oral TID BM   heparin  injection (subcutaneous)  5,000 Units Subcutaneous Q8H   insulin  aspart  0-9 Units Subcutaneous Q4H   metoCLOPramide  (REGLAN ) injection  10 mg Intravenous Q8H   multivitamin  1 tablet Oral QHS   pantoprazole  (  PROTONIX ) IV  40 mg Intravenous Daily   sodium chloride  flush  3 mL Intravenous Q12H   traZODone  50 mg Oral QHS    Infusions:  heparin  10,000 units/ 20 mL infusion syringe 250 Units/hr (10/03/23 1350)   milrinone  0.125 mcg/kg/min (10/03/23 1500)   prismasol BGK 4/2.5 1,500 mL/hr at 10/03/23 1404   prismasol BGK 4/2.5 400 mL/hr at 10/03/23 0700   prismasol BGK 4/2.5 400 mL/hr at 10/03/23 0700    PRN Medications: dextrose , heparin , levalbuterol, ondansetron  (ZOFRAN ) IV, mouth rinse, oxyCODONE , phenol, sodium chloride  flush, traMADol    Assessment/Plan   1. Acute systolic CHF/Cardiogenic shock: Ischemic cardiomyopathy. Echo pre-CABG with EF 35-40%, RV hard to visualize. Difficulty separating from cardiopulmonary bypass following CABG 09/27/23.  Remains on low dose milrinone  0.125 for RV support. Co-ox 52%. CVVHD  for volume and solute removal.  - Continue milrinone  0.125 for RV support for now, hopefully wean in the next few days - CVP 8, Remains anuric. Continue CRRT for volume removal x 1 more day. Hopefully can transition to [facility] tomorrow   2. CAD: NSTEMI. Hx PCI to LAD in 2005.  NSTEMI 09/21/23 with multivessel disease on LHC.  S/p CABG X 4 (LIMA to LAD, SVG to OM1, sequential to OM3, SVG to PDA) on 09/27/23 - Continue ASA + plavix 75mg  daily - Continue atorva 80. LDL 121 (not adherent with home meds prior to admit)  3. AKI on CKD III: Admission creatinine was 1.9.  Creatinine peaked above 4, worsening uremic symptoms. Started on CRRT for clearance  - Support cardiac output with milrinone . - On CRRT. Hopefully will have renal recovery  - if remains anuric will plan transition to iHD later this wk   4. Anemia: Post-op, hgb stable.  5. DM II: Uncontrolled, A1c 11.3% - Insulin  per TCTS  Length of Stay: 12  CRITICAL CARE Performed by: Ruddy Corral, PA-C    Total critical care time: 10 minutes  Critical care time was exclusive of separately billable procedures and treating other patients.  Critical care was necessary to treat or prevent imminent or life-threatening deterioration.  Critical care was time spent personally by me on the following activities: development of treatment plan with patient and/or surrogate as well as nursing, discussions with consultants, evaluation of patient's response to treatment, examination of patient, obtaining history from patient or surrogate, ordering and performing treatments and interventions, ordering and review of laboratory studies, ordering and review of radiographic studies, pulse oximetry and re-evaluation of patient's condition.   Sherhonda Gaspar 10/03/2023, 3:01 PM  Advanced Heart Failure Team Pager [phone] (M-F; 7a - 5p)   Agree with above.   Remains on milrinone  for RV support. Anuric. Remains on CRRT pulling 100/hr  Remains weak.  Feels bloated. Denies CP or SOB.   On Reglan  for ileus. 3 BMs overnight but still distended  Rhythm stable  General:  Standing up with PT support No resp difficulty HEENT: normal Neck: supple. JVP 8-10 Cor: Regular rate & rhythm. No rubs, gallops or murmurs. Lungs: decreased at bases Abdomen: + distended. Nontender  Hypoactive BS Extremities: no cyanosis, clubbing, rash, 1+ edema + UNNA Neuro: alert & orientedx3, cranial nerves grossly intact. moves all 4 extremities w/o difficulty. Affect pleasant  Continues to struggle with RV failure, anuric renal failure and post-op ileus.  Will continue CRRT at least one more day as not back to pre-op weight yet. If BP drops can cut back sooner. Continue milrinone  (Can switch to DBA if needed).  Continue Reglan  for  ileus. Continue to mobilize   Plan d/w CCM and Renal   cancerCRITICAL CARE Performed by: Bensimhon, Daniel  Total critical care time: 42 minutes  Critical care time was exclusive of separately billable procedures and treating other patients.  Critical care was necessary to treat or prevent imminent or life-threatening deterioration.  Critical care was time spent personally by me (independent of midlevel providers or residents) on the following activities: development of treatment plan with patient and/or surrogate as well as nursing, discussions with consultants, evaluation of patient's response to treatment, examination of patient, obtaining history from patient or surrogate, ordering and performing treatments and interventions, ordering and review of laboratory studies, ordering and review of radiographic studies, pulse oximetry and re-evaluation of patient's condition.  Jules Oar, MD

## 2023-10-04 DIAGNOSIS — I5023 Acute on chronic systolic (congestive) heart failure: Secondary | ICD-10-CM | POA: Diagnosis not present

## 2023-10-04 DIAGNOSIS — N1831 Chronic kidney disease, stage 3a: Secondary | ICD-10-CM | POA: Diagnosis not present

## 2023-10-04 DIAGNOSIS — R570 Cardiogenic shock: Secondary | ICD-10-CM | POA: Diagnosis not present

## 2023-10-04 DIAGNOSIS — I214 Non-ST elevation (NSTEMI) myocardial infarction: Secondary | ICD-10-CM | POA: Diagnosis not present

## 2023-10-04 LAB — HEPARIN LEVEL (UNFRACTIONATED): Heparin Unfractionated: 0.43 [IU]/mL (ref 0.30–0.70)

## 2023-10-04 LAB — RENAL FUNCTION PANEL
Albumin: 2.5 g/dL — ABNORMAL LOW (ref 3.5–5.0)
Albumin: 2.6 g/dL — ABNORMAL LOW (ref 3.5–5.0)
Anion gap: 9 (ref 5–15)
Anion gap: 11 (ref 5–15)
BUN: 17 mg/dL (ref 8–23)
BUN: 18 mg/dL (ref 8–23)
CO2: 24 mmol/L (ref 22–32)
CO2: 25 mmol/L (ref 22–32)
Calcium: 8.3 mg/dL — ABNORMAL LOW (ref 8.9–10.3)
Calcium: 8.4 mg/dL — ABNORMAL LOW (ref 8.9–10.3)
Chloride: 102 mmol/L (ref 98–111)
Chloride: 100 mmol/L (ref 98–111)
Creatinine, Ser: 1.9 mg/dL — ABNORMAL HIGH (ref 0.61–1.24)
Creatinine, Ser: 1.82 mg/dL — ABNORMAL HIGH (ref 0.61–1.24)
GFR, Estimated: 37 mL/min — ABNORMAL LOW (ref >60)
GFR, Estimated: 39 mL/min — ABNORMAL LOW (ref >60)
Glucose, Bld: 124 mg/dL — ABNORMAL HIGH (ref 70–99)
Glucose, Bld: 138 mg/dL — ABNORMAL HIGH (ref 70–99)
Phosphorus: 2.2 mg/dL — ABNORMAL LOW (ref 2.5–4.6)
Phosphorus: 2.9 mg/dL (ref 2.5–4.6)
Potassium: 3.7 mmol/L (ref 3.5–5.1)
Potassium: 4.1 mmol/L (ref 3.5–5.1)
Sodium: 135 mmol/L (ref 135–145)
Sodium: 136 mmol/L (ref 135–145)

## 2023-10-04 LAB — COOXEMETRY PANEL
Carboxyhemoglobin: 1.1 % (ref 0.5–1.5)
Carboxyhemoglobin: 1.3 % (ref 0.5–1.5)
Methemoglobin: <0.7 % (ref 0.0–1.5)
Methemoglobin: <0.7 % (ref 0.0–1.5)
O2 Saturation: 67.1 %
O2 Saturation: 53.2 %
Total hemoglobin: 10.1 g/dL — ABNORMAL LOW (ref 12.0–16.0)
Total hemoglobin: 10.3 g/dL — ABNORMAL LOW (ref 12.0–16.0)

## 2023-10-04 LAB — APTT: aPTT: 70 s — ABNORMAL HIGH (ref 24–36)

## 2023-10-04 LAB — CBC
HCT: 31.3 % — ABNORMAL LOW (ref 39.0–52.0)
Hemoglobin: 9.8 g/dL — ABNORMAL LOW (ref 13.0–17.0)
MCH: 28.1 pg (ref 26.0–34.0)
MCHC: 31.3 g/dL (ref 30.0–36.0)
MCV: 89.7 fL (ref 80.0–100.0)
Platelets: 223 10*3/uL (ref 150–400)
RBC: 3.49 MIL/uL — ABNORMAL LOW (ref 4.22–5.81)
RDW: 13.8 % (ref 11.5–15.5)
WBC: 12.6 10*3/uL — ABNORMAL HIGH (ref 4.0–10.5)
nRBC: 1.3 % — ABNORMAL HIGH (ref 0.0–0.2)

## 2023-10-04 LAB — GLUCOSE, CAPILLARY
Glucose-Capillary: 111 mg/dL — ABNORMAL HIGH (ref 70–99)
Glucose-Capillary: 115 mg/dL — ABNORMAL HIGH (ref 70–99)
Glucose-Capillary: 129 mg/dL — ABNORMAL HIGH (ref 70–99)
Glucose-Capillary: 134 mg/dL — ABNORMAL HIGH (ref 70–99)
Glucose-Capillary: 137 mg/dL — ABNORMAL HIGH (ref 70–99)
Glucose-Capillary: 185 mg/dL — ABNORMAL HIGH (ref 70–99)
Glucose-Capillary: 211 mg/dL — ABNORMAL HIGH (ref 70–99)

## 2023-10-04 LAB — MAGNESIUM: Magnesium: 2.6 mg/dL — ABNORMAL HIGH (ref 1.7–2.4)

## 2023-10-04 MED ORDER — INSULIN GLARGINE-YFGN 100 UNIT/ML ~~LOC~~ SOLN
10.0000 [IU] | Freq: Two times a day (BID) | SUBCUTANEOUS | Status: DC
  Administered 2023-10-04 (×2): 10 [IU] via SUBCUTANEOUS
  Filled 2023-10-04 (×4): qty 0.1

## 2023-10-04 MED ORDER — SODIUM PHOSPHATES 45 MMOLE/15ML IV SOLN
15.0000 mmol | Freq: Once | INTRAVENOUS | Status: AC
  Administered 2023-10-04: 15 mmol via INTRAVENOUS
  Filled 2023-10-04: qty 5

## 2023-10-04 MED ORDER — HEPARIN SODIUM (PORCINE) 5000 UNIT/ML IJ SOLN
5000.0000 [IU] | Freq: Three times a day (TID) | INTRAMUSCULAR | Status: DC
  Administered 2023-10-04 – 2023-10-13 (×27): 5000 [IU] via SUBCUTANEOUS
  Filled 2023-10-04 (×27): qty 1

## 2023-10-04 MED ORDER — HEPARIN SODIUM (PORCINE) 5000 UNIT/ML IJ SOLN: 5000.0000 [IU] | Freq: Three times a day (TID) | INTRAMUSCULAR | Status: DC

## 2023-10-04 MED ORDER — ALTEPLASE 2 MG IJ SOLR
2.0000 mg | Freq: Once | INTRAMUSCULAR | Status: DC
Start: 2023-10-04 — End: 2023-10-12
  Filled 2023-10-04: qty 2

## 2023-10-04 MED ORDER — HEPARIN SODIUM (PORCINE) 1000 UNIT/ML IJ SOLN
INTRAMUSCULAR | Status: AC
  Administered 2023-10-04: 2800 [IU]
  Filled 2023-10-04: qty 2

## 2023-10-04 MED ORDER — INSULIN ASPART 100 UNIT/ML IJ SOLN
2.0000 [IU] | Freq: Three times a day (TID) | INTRAMUSCULAR | Status: DC
  Administered 2023-10-04 – 2023-10-05 (×2): 2 [IU] via SUBCUTANEOUS

## 2023-10-04 NOTE — Progress Notes (Signed)
   10/04/23 1527  Spiritual Encounters  Type of Visit Initial  Care provided to: Patient;Pt and family  Referral source Patient request  Reason for visit Routine spiritual support  OnCall Visit No  Spiritual Framework  Presenting Themes Values and beliefs  Values/beliefs Prayer  Community/Connection Family  Patient Stress Factors Health changes  Family Stress Factors Health changes  Interventions  Spiritual Care Interventions Made Prayer;Compassionate presence  Intervention Outcomes  Outcomes Awareness of health;Awareness of support   While walking through 2H, the Pt stopped the chaplain in the hallway and requested prayer. Chaplain offered a  word of prayer with the patient. The Pt. Expressed gratitude for taking the time to stop and provide spiritual support.

## 2023-10-04 NOTE — Progress Notes (Signed)
 Physical Therapy Treatment Patient Details Name: [name] MRN: [id number] DOB: 01/02/1951 Today's Date: 10/04/2023   History of Present Illness 73 y.o. male presenting 09/21/23 with chest pain and elevated troponins. NSTEMI; 5/29 L heart cath with multivessel CAD, 6/04 CABGx4; 6/5 extubated; post-CABG cardioplegia requiring inotropic support; decr renal function with uremic encephalopathy, CRRT started 6/7 PMH: DM, MI, HTN    PT Comments  Patient up in recliner on arrival. Co-treatment with OT due to strong posterior lean requiring +2 assist to work on anterior wt-shift and marching in place. Patient continues with cognitive deficits including not aware of sternal precautions or how to adhere to them, decr problem solving, and decr safety awareness (thinks he'll be ready to go home tomorrow).     If plan is discharge home, recommend the following: Two people to help with walking and/or transfers;Two people to help with bathing/dressing/bathroom;Direct supervision/assist for medications management;Direct supervision/assist for financial management;Assist for transportation;Help with stairs or ramp for entrance;Supervision due to cognitive status   Can travel by private vehicle        Equipment Recommendations  Rolling walker (2 wheels)    Recommendations for Other Services       Precautions / Restrictions Precautions Precautions: Sternal;Fall;Other (comment) Precaution Booklet Issued: No Recall of Precautions/Restrictions: Impaired Precaution/Restrictions Comments: educated on precautions and required max cues to adhere; CRRT L internal jugular     Mobility  Bed Mobility               General bed mobility comments: pt up to chair with nursing via stedy    Transfers Overall transfer level: Needs assistance Equipment used: Rolling walker (2 wheels) Transfers: Sit to/from Stand Sit to Stand: Min assist, +2 physical assistance           General transfer comment: using  momentum and hands on knees, stood from recliner x 2; pt with posterior lean and unable to self-correct despite tactile and verbal cues; some improvement with marching (pt barely clearing feet off the floor)    Ambulation/Gait               General Gait Details: unable due to lines   Stairs             Wheelchair Mobility     Tilt Bed    Modified Rankin (Stroke Patients Only)       Balance Overall balance assessment: Needs assistance Sitting-balance support: No upper extremity supported, Feet supported Sitting balance-Leahy Scale: Fair     Standing balance support: During functional activity, Bilateral upper extremity supported, Reliant on assistive device for balance Standing balance-Leahy Scale: Poor Standing balance comment: posterior bias; stood for ~ each time                            Communication Communication Communication: No apparent difficulties  Cognition Arousal: Alert Behavior During Therapy: WFL for tasks assessed/performed   PT - Cognitive impairments: Attention, Initiation, Sequencing, Memory, Orientation, Problem solving, Safety/Judgement   Orientation impairments: Situation (knew MI, prompts to report heart surgery)                   PT - Cognition Comments: required max cues to adhere to sternal precautions with each transfer; difficulty recognizing posterior lean and how to correct (max cues and up to max assist for balance) Following commands: Impaired Following commands impaired: Follows one step commands with increased time    Cueing Cueing Techniques: Verbal  cues, Tactile cues  Exercises      General Comments General comments (skin integrity, edema, etc.): HR 120-128      Pertinent Vitals/Pain Pain Assessment Pain Assessment: No/denies pain Faces Pain Scale: Hurts little more    Home Living                          Prior Function            PT Goals (current goals can now be  found in the care plan section) Acute Rehab PT Goals Patient Stated Goal: agrees he wants to get moving Time For Goal Achievement: 10/15/23 Potential to Achieve Goals: Good Progress towards PT goals: Progressing toward goals    Frequency    Min 2X/week      PT Plan      Co-evaluation PT/OT/SLP Co-Evaluation/Treatment: Yes Reason for Co-Treatment: For patient/therapist safety;To address functional/ADL transfers;Complexity of the patient's impairments (multi-system involvement) PT goals addressed during session: Mobility/safety with mobility;Balance;Proper use of DME        AM-PAC PT 6 Clicks Mobility   Outcome Measure  Help needed turning from your back to your side while in a flat bed without using bedrails?: Total Help needed moving from lying on your back to sitting on the side of a flat bed without using bedrails?: Total Help needed moving to and from a bed to a chair (including a wheelchair)?: Total Help needed standing up from a chair using your arms (e.g., wheelchair or bedside chair)?: A Lot Help needed to walk in hospital room?: Total Help needed climbing 3-5 steps with a railing? : Total 6 Click Score: 7    End of Session Equipment Utilized During Treatment: Oxygen Activity Tolerance: Patient limited by fatigue Patient left: in chair;with call bell/phone within reach;with nursing/sitter in room Nurse Communication: Mobility status;Need for lift equipment PT Visit Diagnosis: Unsteadiness on feet (R26.81);Muscle weakness (generalized) (M62.81);Difficulty in walking, not elsewhere classified (R26.2)     Time: 1610-9604 PT Time Calculation (min) (ACUTE ONLY): 24 min  Charges:    $Gait Training: 8-22 mins PT General Charges $$ ACUTE PT VISIT: 1 Visit                      Gayle Kava, PT Acute Rehabilitation Services  Office [phone]    Guilford Leep 10/04/2023, 9:41 AM

## 2023-10-04 NOTE — Progress Notes (Signed)
 Patient ID: [name], male   DOB: Jul 15, 1950, 73 y.o.   MRN: [id number] [facility] Progress Note   Assessment/ Plan:    1.  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  Developed nonoliguric postoperative acute kidney injury likely from ischemic ATN given difficulties of weaning from cardiopulmonary bypass.  Started on CRRT on 6/7 for management of volume status (fixed urine output on high-dose furosemide ) and azotemia with mental status improving after CRRT per charting  ------------------ - Continue CRRT.  UF goal 100 ml/hr as tolerated.  If circuit clots or cartridge expires today then do not restart.  Stop CRRT by end of day shift 6/11. Order placed    - renal panel in process - will follow  2.  NSTEMI: With multivessel coronary artery disease and underwent four-vessel CABG on 09/27/2023.  Postoperative course significant for difficulty with getting him off cardiopulmonary bypass and developed postoperative shock with pressor/inotrope support. - post-operative care per CT surgery   3.  Postoperative shock: Previously on norepinephrine , epinephrine  (now off)  - on milrinone  per CHF team  4.  Normocytic Anemia: Likely associated with surgical/postoperative blood loss.  - PRBC's per primary team  - Defer ESA for now with recent NSTEMI but anticipate starting when acceptable   5. CKD stage 3a - note baseline Cr 1.3 - 1.6   Disposition - in ICU on CRRT     Subjective:   He has been on CRRT.  Seen and examined on CRRT; procedure supervised.  Left internal jugular nontunneled catheter in use.  There is no UOP over 6/10 charted.  He had 2.5 L UF over 6/10 with CRRT as charted thus far.  On milrinone .  Spoke with CHF team on 6/10 and we agreed on one additional day of CRRT at that time and then trial of stopping CRRT and assessing renal needs.  He has been alert and interactive overnight.  About  to get out of bed to chair.   Review of systems:    Denies n/v; has abdominal distention; had a BM per nursing.  Clear diet  Denies shortness of breath or chest pain   Objective:   BP 95/78   Pulse (!) 111   Temp 98 F (36.7 C) (Oral)   Resp 13   Ht 5' 4 (1.626 m)   Wt 95.6 kg   SpO2 97%   BMI 36.18 kg/m   Intake/Output Summary (Last 24 hours) at 10/04/2023 0603 Last data filed at 10/04/2023 0500 Gross per 24 hour  Intake 589.42 ml  Output 2662.9 ml  Net -2073.48 ml   Weight change:   Physical Exam:     General adult male in bed in no acute distress HEENT normocephalic atraumatic extraocular movements intact sclera anicteric Neck supple trachea midline Lungs clear to auscultation bilaterally normal work of breathing at rest on 2 liters; midline sternal incision  Heart S1S2 Abdomen soft nontender distended Extremities no edema  Psych normal mood and affect; no anxiety or agitation  Neuro awake and conversant; oriented to person - some delay in answering year but correct.  States at a medical clinic.  follows basic commands Access: left internal jugular nontunneled dialysis catheter    Imaging: DG CHEST PORT 1 VIEW Result Date: 10/03/2023 CLINICAL DATA:  [id number].  Pulmonary edema. EXAM: PORTABLE CHEST 1 VIEW COMPARISON:  Portable chest 10/01/2023 FINDINGS: 5:39 a.m. bilateral IJ approach catheters are again noted both terminating  in the upper to mid SVC. NGT has been removed. There are CABG changes with mild cardiomegaly without evidence for CHF. There is asymmetric left perihilar linear atelectasis or scarring, small left pleural effusion. The hypoinflated lungs are otherwise clear with limited view of the bases. The mediastinum is normally outlined. No acute osseous findings. Stable overall aeration pattern. IMPRESSION: 1. Hypoinflated lungs with asymmetric left perihilar linear atelectasis or scarring, small left pleural effusion. 2. Low inspiration on exam with no other  visible focal opacity. 3. CABG changes with mild cardiomegaly without evidence for CHF. 4. Bilateral IJ approach catheters. NGT has been removed. Electronically Signed   By: Denman Fischer M.D.   On: 10/03/2023 07:17     Labs: BMET Recent Labs  Lab 09/30/23 1600 10/01/23 0500 10/01/23 1604 10/02/23 0410 10/02/23 1600 10/03/23 0500 10/03/23 1705  NA 137 138 138 138 131* 136 134*  K 4.0 4.2 3.9 3.8 4.0 3.8 3.9  CL 105 105 103 104 100 102 99  CO2 21* 23 25 27 25 24 22   GLUCOSE 116* 116* 131* 54* 230* 77 244*  BUN 36* 25* 19 15 15 13 18   CREATININE 3.94* 2.78* 2.22* 1.90* 1.83* 1.75* 1.85*  CALCIUM  8.0* 7.9* 7.6* 8.0* 7.4* 8.1* 8.1*  PHOS 4.4 3.7 2.4* 2.5 2.3* 3.0 2.6   CBC Recent Labs  Lab 10/01/23 0500 10/02/23 0410 10/03/23 0500 10/04/23 0531  WBC 9.9 9.8 12.3* 12.6*  HGB 9.2* 9.2* 10.1* 9.8*  HCT 29.4* 30.1* 31.9* 31.3*  MCV 91.6 92.0 89.6 89.7  PLT 184 163 195 223    Medications:     (feeding supplement) PROSource Plus  30 mL Oral TID WC   aspirin   81 mg Oral Daily   atorvastatin   80 mg Oral Daily   bisacodyl   10 mg Oral Daily   Or   bisacodyl   10 mg Rectal Daily   Chlorhexidine  Gluconate Cloth  6 each Topical Daily   clopidogrel  75 mg Oral Daily   docusate  200 mg Oral Daily   feeding supplement  1 Container Oral TID BM   heparin  injection (subcutaneous)  5,000 Units Subcutaneous Q8H   insulin  aspart  0-9 Units Subcutaneous Q4H   metoCLOPramide  (REGLAN ) injection  10 mg Intravenous Q8H   multivitamin  1 tablet Oral QHS   pantoprazole  (PROTONIX ) IV  40 mg Intravenous Daily   sodium chloride  flush  3 mL Intravenous Q12H   traZODone  50 mg Oral QHS    Nan Aver, MD 10/04/2023, 6:19 AM

## 2023-10-04 NOTE — Progress Notes (Signed)
 Occupational Therapy Treatment Patient Details Name: [name] MRN: [id number] DOB: October 16, 1950 Today's Date: 10/04/2023   History of present illness 73 y.o. male presenting 09/21/23 with chest pain and elevated troponins. NSTEMI; 5/29 L heart cath with multivessel CAD, 6/04 CABGx4; 6/5 extubated; post-CABG cardioplegia requiring inotropic support; decr renal function with uremic encephalopathy, CRRT started 6/7 PMH: DM, MI, HTN   OT comments  Pt received in chair on CRRT. Focus of session on sit to stand and standing with RW with +2 assist. Pt demonstrates posterior lean without awareness or ability to self correct initially, improved as session continued. Pt requires max verbal cues for sternal precautions. He demonstrates poor awareness of deficits. He is aware he is here for a heart attack, needed cues to state he had heart surgery. HR to 128 with marching in place. Pt states he would like to return to fishing when he gets well. Patient will benefit from intensive inpatient follow-up therapy, >3 hours/day.      If plan is discharge home, recommend the following:  Two people to help with walking and/or transfers;A lot of help with bathing/dressing/bathroom;Assistance with cooking/housework;Assistance with feeding;Direct supervision/assist for medications management;Direct supervision/assist for financial management;Assist for transportation;Help with stairs or ramp for entrance   Equipment Recommendations  BSC/3in1    Recommendations for Other Services      Precautions / Restrictions Precautions Precautions: Sternal;Fall;Other (comment) (CRRT) Recall of Precautions/Restrictions: Impaired Precaution/Restrictions Comments: educated on precautions and required max cues to adhere Restrictions Weight Bearing Restrictions Per Provider Order: No       Mobility Bed Mobility               General bed mobility comments: pt up to chair with nursing    Transfers Overall transfer level:  Needs assistance Equipment used: Rolling walker (2 wheels) Transfers: Sit to/from Stand Sit to Stand: Min assist, +2 physical assistance           General transfer comment: using momentum and hands on knees, stood from recliner x 2; pt with posterior lean and unable to self-correct despite tactile and verbal cues; some improvement with marching (pt barely clearing feet off the floor)     Balance Overall balance assessment: Needs assistance   Sitting balance-Leahy Scale: Fair       Standing balance-Leahy Scale: Poor Standing balance comment: posterior bias; stood for ~ each time                           ADL either performed or assessed with clinical judgement   ADL Overall ADL's : Needs assistance/impaired     Grooming: Set up;Sitting;Wash/dry face                                      Extremity/Trunk Assessment              Vision       Perception     Praxis     Communication Communication Communication: Impaired Factors Affecting Communication: Hearing impaired   Cognition Arousal: Alert Behavior During Therapy: WFL for tasks assessed/performed Cognition: Cognition impaired   Orientation impairments: Situation Awareness: Online awareness impaired, Intellectual awareness intact Memory impairment (select all impairments): Short-term memory, Declarative long-term memory   Executive functioning impairment (select all impairments): Sequencing, Initiation, Problem solving  Following commands: Impaired Following commands impaired: Follows one step commands with increased time      Cueing   Cueing Techniques: Verbal cues, Tactile cues  Exercises      Shoulder Instructions       General Comments HR to 128 max    Pertinent Vitals/ Pain       Pain Assessment Pain Assessment: Faces Faces Pain Scale: No hurt  Home Living Family/patient expects to be discharged to:: Private residence                                         Prior Functioning/Environment              Frequency  Min 2X/week        Progress Toward Goals  OT Goals(current goals can now be found in the care plan section)  Progress towards OT goals: Progressing toward goals  Acute Rehab OT Goals OT Goal Formulation: With patient Time For Goal Achievement: 10/16/23 Potential to Achieve Goals: Good  Plan      Co-evaluation    PT/OT/SLP Co-Evaluation/Treatment: Yes Reason for Co-Treatment: For patient/therapist safety;To address functional/ADL transfers;Complexity of the patient's impairments (multi-system involvement) PT goals addressed during session: Mobility/safety with mobility;Balance;Proper use of DME OT goals addressed during session: ADL's and self-care;Strengthening/ROM      AM-PAC OT 6 Clicks Daily Activity     Outcome Measure   Help from another person eating meals?: A Little Help from another person taking care of personal grooming?: A Little Help from another person toileting, which includes using toliet, bedpan, or urinal?: Total Help from another person bathing (including washing, rinsing, drying)?: A Lot Help from another person to put on and taking off regular upper body clothing?: A Lot Help from another person to put on and taking off regular lower body clothing?: Total 6 Click Score: 12    End of Session Equipment Utilized During Treatment: Rolling walker (2 wheels)  OT Visit Diagnosis: Unsteadiness on feet (R26.81);Other abnormalities of gait and mobility (R26.89);Muscle weakness (generalized) (M62.81);Other symptoms and signs involving cognitive function   Activity Tolerance Treatment limited secondary to medical complications (Comment) (on CRRT)   Patient Left in chair;with call bell/phone within reach   Nurse Communication Mobility status        Time: 2130-8657 OT Time Calculation (min): 23 min  Charges: OT General Charges $OT Visit: 1  Visit OT Treatments $Therapeutic Activity: 8-22 mins  Avanell Leigh, OTR/L Acute Rehabilitation Services Office: [phone]  Jonette Nestle 10/04/2023, 10:10 AM

## 2023-10-04 NOTE — Progress Notes (Signed)
 7 Days Post-Op Procedure(s) (LRB): CORONARY ARTERY BYPASS GRAFTING (CABG) TIMES FOUR UTILIZING THE LEFT INTERNAL MAMMARY ARTERY AND ENDOSCOPIC VEIN HARVEST RIGHT GREATER SAPHENOUS VEIN (N/A) ECHOCARDIOGRAM, TRANSESOPHAGEAL, INTRAOPERATIVE (N/A) Subjective: Feels well this AM  Objective: Vital signs in last 24 hours: Temp:  [97.9 F (36.6 C)-98.8 F (37.1 C)] 98 F (36.7 C) (06/11 0345) Pulse Rate:  [106-159] 117 (06/11 0645) Cardiac Rhythm: Sinus tachycardia (06/11 0400) Resp:  [6-29] 24 (06/11 0645) BP: (83-178)/(55-116) 151/116 (06/11 0615) SpO2:  [78 %-100 %] 95 % (06/11 0645) Weight:  [92.9 kg] 92.9 kg (06/11 0500)  Hemodynamic parameters for last 24 hours: CVP:  [0 mmHg-22 mmHg] 2 mmHg  Intake/Output from previous day: 06/10 0701 - 06/11 0700 In: 343.4 [P.O.:250; I.V.:93.4] Out: 2836.9  Intake/Output this shift: Total I/O In: 50 [P.O.:50] Out: -   General appearance: alert, cooperative, and no distress Neurologic: intact Heart: tachy, regular Lungs: diminished breath sounds bibasilar Abdomen: mildly distended, nontender  Lab Results: Recent Labs    10/03/23 0500 10/04/23 0531  WBC 12.3* 12.6*  HGB 10.1* 9.8*  HCT 31.9* 31.3*  PLT 195 223   BMET:  Recent Labs    10/03/23 1705 10/04/23 0531  NA 134* 135  K 3.9 3.7  CL 99 102  CO2 22 24  GLUCOSE 244* 124*  BUN 18 17  CREATININE 1.85* 1.90*  CALCIUM  8.1* 8.3*    PT/INR: No results for input(s): LABPROT, INR in the last 72 hours. ABG    Component Value Date/Time   PHART 7.336 (L) 09/28/2023 0854   HCO3 20.5 09/28/2023 0854   TCO2 22 09/28/2023 0854   ACIDBASEDEF 5.0 (H) 09/28/2023 0854   O2SAT 67.1 10/04/2023 0531   CBG (last 3)  Recent Labs    10/03/23 2026 10/03/23 2346 10/04/23 0342  GLUCAP 234* 155* 115*    Assessment/Plan: S/P Procedure(s) (LRB): CORONARY ARTERY BYPASS GRAFTING (CABG) TIMES FOUR UTILIZING THE LEFT INTERNAL MAMMARY ARTERY AND ENDOSCOPIC VEIN HARVEST RIGHT  GREATER SAPHENOUS VEIN (N/A) ECHOCARDIOGRAM, TRANSESOPHAGEAL, INTRAOPERATIVE (N/A) Looks better today NEURO- intact CV- co-ox 67 on milrinone  0.125  ASA, Plavix, atorvastatin   Still sinus tachy- start beta blocker when able RESP- sat 95-100% on 2L [place] continue IS RENAL- AKI in setting of CKD  On CRRT, another 2L negative yesterday  To end CRRT today ENDO- CBG elevated in evenings, o/w reasonably controlled GI- ileus resolving  Tolerating diet Deconditioning- severe, continue PT/OT  LOS: 13 days    [name] 10/04/2023

## 2023-10-04 NOTE — Progress Notes (Signed)
 NAME:  [name], MRN:  [id number], DOB:  1950/06/06, LOS: 13 ADMISSION DATE:  09/21/2023, CONSULTATION DATE: 09/28/2023 REFERRING MD: Dr. [name], CHIEF COMPLAINT: Status post CABG x 4  History of Present Illness:  73 year old male with coronary artery disease, diabetes type 2, hypertension, CKD stage IIIa, hyperlipidemia and chronic HFrEF who initially presented about a week ago with epigastric pain radiating to the chest, was admitted with acute NSTEMI, he underwent cardiac cath which showed severe multivessel coronary artery disease with elevated LVEDP.  Patient underwent CABG x 4, he had decreased urine output post CABG, he was not extubated per rapid weaning protocol, remain on vasopressor support with Levophed , epinephrine  and milrinone .  PCCM was consulted for help evaluation medical management  During my evaluation, patient is awake, following commands, tolerating spontaneous breathing trial.  Pertinent  Medical History   Past Medical History:  Diagnosis Date   CAD (coronary artery disease)    Diabetes mellitus without complication (HCC)    Hypercholesterolemia    Hypertension    MI (myocardial infarction) (HCC)    Renal insufficiency     Significant Hospital Events: Including procedures, antibiotic start and stop dates in addition to other pertinent events   5/29 admitted with acute NSTEMI 6/4 CABG x 4 6/5 consulted PCCM 6/7 start CRRT, bowel movements after enemas. Minimal NGT output.  6/8 foley removed, able to start mobility, started clear liquids 6/10 d/c RIJ introducer, con't milrinone .  Interim History / Subjective:  On CRRT yesterday, worked with PT.  Afebrile. He denies complaints today.  Objective    Blood pressure (!) 151/116, pulse (!) 117, temperature 98 F (36.7 C), temperature source Oral, resp. rate (!) 24, height 5' 4 (1.626 m), weight 92.9 kg, SpO2 95%. CVP:  [0 mmHg-22 mmHg] 2 mmHg      Intake/Output Summary (Last 24 hours) at 10/04/2023  0721 Last data filed at 10/04/2023 0700 Gross per 24 hour  Intake 343.35 ml  Output 2836.9 ml  Net -2493.55 ml   Filed Weights   10/02/23 0500 10/03/23 0415 10/04/23 0500  Weight: (S) 97 kg 95.6 kg 92.9 kg    Examination: General: chronically ill appearing man sitting in the recliner in NAD HEENT [place]/AT, eyes anicteric Neuro: awake, alert, moving extremities Chest: breathing comfortably on [place], reduced basilar breath sounds Heart: S1S2, RRR Abdomen: distended but nontender Extremities: improving edema- minimal now   I/O -2.5L, net -12.9L for admission (questionable since he was on the floor pre-op) UOP stopped  Coox 67% BUN 17, Cr 1.9 on CRRT WBC 12.6 H/H  9.8/31.3 Platelets 223  BG 100-200s  Resolved problem list   Assessment and Plan  Multivessel coronary artery disease,  acute NSTEMI s/p CABG x 4 Post-bypass cardioplegia requiring inotropic support Acute on chronic HFrEF; EF 35 to 40% Post cardiotomy shock -can likely titrate off milrinone . Will defer to AHF.  -can stop CRRT when filter clots; will replace foley to watch for renal recovery once he starts making urine -DAPT, statin -pain control per protocol -d/c post-op PPI -con't advancing mobility- PT, Ot -con't encouraging PO intake -GDMT limited due to renal failure and shock   Acute pulmonary edema, acute respiratory failure with hypoxia -volume control with HD, monitor for renal recovery with UOP -pulmonary hygiene, wean O2 as able  Leukocytosis; CXR reassuring no pneumonia -initial CVC removed; still needs HD catheter (line day # 5 for that) -no foley -monitor for signs of infection, but no obvious source  AKI on CKD stage IIIa, likely  ATN -stopping CRRT when filter clots; then monitor for renal recovery and iHd PRN -appreciate nephrology's management -keep HD line -strict I/O -renally dose meds, avoid nephrotoxic meds  H/o Hypertension -con't holding PTA  antihypertensives  Hyperlipidemia -con't statin  Poorly-controlled diabetes type 2 with hyperglycemia- controlled; A1c 11.3 -restart glargine 10 units BID -add mealtime aspart 2 units TIDAC -SSI PRN -goal BG 140-180  Expected perioperative blood loss anemia -trasnfuse for Hb <7 or hemodynamically significant bleeding  Acute metabolic encephalopathy likely due to renal failure; improved today Insomnia -CRRT for metabolic clearance -con't bedtime trazodone -PT, OT, OOB mobility  Post-op ileus, improved constipation- resolved -con't  renal diet -bowel regimen d/c; monitor for recurrent constipation -ambulate  Deconditioning -anticipate need for CIR   Best Practice (right click and Reselect all SmartList Selections daily)   Diet/type: renal diet DVT prophylaxis: [place] heparin  GI prophylaxis: PPI Lines: HD cath, Arterial Line Foley:  d/c 6/8 Code Status:  full code Last date of multidisciplinary goals of care discussion [6/7 with wife & son]   Labs   CBC: Recent Labs  Lab 09/30/23 1600 10/01/23 0500 10/02/23 0410 10/03/23 0500 10/04/23 0531  WBC 11.8* 9.9 9.8 12.3* 12.6*  HGB 8.8* 9.2* 9.2* 10.1* 9.8*  HCT 28.6* 29.4* 30.1* 31.9* 31.3*  MCV 92.9 91.6 92.0 89.6 89.7  PLT 149* 184 163 195 223    Basic Metabolic Panel: Recent Labs  Lab 09/30/23 1600 10/01/23 0500 10/01/23 1604 10/02/23 0410 10/02/23 1600 10/03/23 0500 10/03/23 1705 10/04/23 0531  NA 137 138   < > 138 131* 136 134* 135  K 4.0 4.2   < > 3.8 4.0 3.8 3.9 3.7  CL 105 105   < > 104 100 102 99 102  CO2 21* 23   < > 27 25 24 22 24   GLUCOSE 116* 116*   < > 54* 230* 77 244* 124*  BUN 36* 25*   < > 15 15 13 18 17   CREATININE 3.94* 2.78*   < > 1.90* 1.83* 1.75* 1.85* 1.90*  CALCIUM  8.0* 7.9*   < > 8.0* 7.4* 8.1* 8.1* 8.3*  MG 2.9* 1.5*  --  2.8*  --  2.6*  --  2.6*  PHOS 4.4 3.7   < > 2.5 2.3* 3.0 2.6 2.2*   < > = values in this interval not displayed.   This patient is critically ill with  multiple organ system failure which requires frequent high complexity decision making, assessment, support, evaluation, and titration of therapies. This was completed through the application of advanced monitoring technologies and extensive interpretation of multiple databases. During this encounter critical care time was devoted to patient care services described in this note for 34 minutes.   [name], DO 10/04/23 11:24 AM [facility]  For contact information, see Amion. If no response to pager, please call PCCM consult pager. After hours, 7PM- 7AM, please call Elink.

## 2023-10-04 NOTE — Progress Notes (Signed)
 Nutrition Follow-up  DOCUMENTATION CODES:   Obesity unspecified  INTERVENTION:  Continue Renal Multivitamin w/ minerals daily Continue 30 ml ProSource Plus TID, each supplement provides 100 kcals and 15 grams protein.  Continue Boost Breeze po TID, each supplement provides 250 kcal and 9 grams of protein Liberalize pt diet to Carb Modified due to increased needs post-op  NUTRITION DIAGNOSIS:   Inadequate oral intake related to altered GI function as evidenced by NPO status. - Progressing, now on diet   GOAL:   Patient will meet greater than or equal to 90% of their needs - Ongoing  MONITOR:   PO intake, Supplement acceptance, I & O's, Labs  REASON FOR ASSESSMENT:   Consult Enteral/tube feeding initiation and management, Assessment of nutrition requirement/status  ASSESSMENT:   Pt with PMH of CAD, uncontrolled DM, HTN, CKD stage III, HLD, and chronic CHF who was admitted 5/29 for chest pain/acute NSTEMI.  5/29 - admitted with acute NSTEMI 6/04 - s/p CABG x 4; remained intubated post op  6/05 - extubated 6/06 - +ileus, unable to successfully advance Cortrak to stomach, discussed with Dr. Fulton Job and NG tube placed instead for decompression 6/07 - CRRT initiated 6/09 - NG removed, CL diet 6/10 - diet advanced to Renal/Carb Modified with 1800 mL FR  Per nephrology notes, plan to stop CRRT this afternoon.   Pt laying in bed, RN at bedside. RN shares that pt has been eating pretty well and most of the food. No nausea or vomiting. Has been drinking the [employer] some, one currently at bedside. Taking the ProSource Plus as well.   No meal intakes recorded since diet was advanced on 6/10.  Admission Weight: 97.1 kg Current Weight: 92.9 kg  Nutrition Related Medications: Dulcolax, Colace, NovoLog  0-9 units q4h, Semglee  10 units BID, Reglan , Rena-vit, IV Sodium Phosphate  Labs: Sodium 135, Potassium 3.7, BUN 17, Creatinine 1.90, Phosphorus 2.2, Magnesium  2.5  CBG: 115-260  mg/dL x 24 hrs   UOP: none  CRRT Net UF: 2867 mL x 24 hrs  Diet Order:   Diet Order             Diet Carb Modified Fluid consistency: Thin; Room service appropriate? Yes with Assist  Diet effective now                  EDUCATION NEEDS:   Education needs have been addressed  Skin:  Skin Assessment: Skin Integrity Issues: Skin Integrity Issues:: Incisions Incisions: R leg, chest  Last BM:  6/10  Height:  Ht Readings from Last 1 Encounters:  09/27/23 5' 4 (1.626 m)   Weight:  Wt Readings from Last 1 Encounters:  10/04/23 92.9 kg   BMI:  Body mass index is 35.17 kg/m.  Estimated Nutritional Needs:  Kcal:  1900-2100 Protein:  100-115 grams Fluid:  >1.9 L/day   Doneta Furbish RD, LDN Clinical Dietitian

## 2023-10-04 NOTE — Progress Notes (Signed)
 Inpatient Rehab Admissions Coordinator:   Plan to continue CRRT throughout today, then stop this evening to monitor renal status.  I will continue to follow.   Loye Rumble, PT, DPT Admissions Coordinator [phone] 10/04/23  10:40 AM

## 2023-10-04 NOTE — TOC Initial Note (Signed)
 Transition of Care [facility]) - Initial/Assessment Note    Patient Details  Name: [name] MRN: [id number] Date of Birth: [birth year]  [facility] CM/SW Contact:    Benjiman Bras, RN Phone Number: 10/04/2023, 11:00 AM  Clinical Narrative:                  TOC CM spoke to pt and gave permission to speak wife. Pt was independent pta and no HH/DME. Will continue to follow for dc needs.     Expected Discharge Plan: Home w Home Health Services Barriers to Discharge: Continued Medical Work up   Patient Goals and CMS Choice Patient states their goals for this hospitalization and ongoing recovery are:: wants to get better          Expected Discharge Plan and Services   Discharge Planning Services: CM Consult   Living arrangements for the past 2 months: Single Family Home                                      Prior Living Arrangements/Services Living arrangements for the past 2 months: Single Family Home Lives with:: Spouse Patient language and need for interpreter reviewed:: Yes        Need for Family Participation in Patient Care: Yes (Comment) Care giver support system in place?: Yes (comment)   Criminal Activity/Legal Involvement Pertinent to Current Situation/Hospitalization: No - Comment as needed  Activities of Daily Living      Permission Sought/Granted Permission sought to share information with : PCP, Family Supports Permission granted to share information with : Yes, Verbal Permission Granted  Share Information with NAME: Kou Gucciardo  Permission granted to share info w AGENCY: Home Health, DME, PCP  Permission granted to share info w Relationship: wife  Permission granted to share info w Contact Information: [phone]  Emotional Assessment Appearance:: Appears stated age Attitude/Demeanor/Rapport: Engaged Affect (typically observed): Accepting Orientation: : Oriented to Self, Oriented to Place, Oriented to  Time, Oriented to  Situation Alcohol / Substance Use: Not Applicable Psych Involvement: No (comment)  Admission diagnosis:  NSTEMI (non-ST elevated myocardial infarction) (HCC) [I21.4] S/P CABG x 4 [Z95.1] Patient Active Problem List   Diagnosis Date Noted   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   Acute on chronic combined systolic and diastolic heart failure (HCC) 09/25/2023   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   Ischemic cardiomyopathy 09/23/2023   Type 2 diabetes mellitus with hyperglycemia, with long-term current use of insulin  (HCC) 09/23/2023   Type 2 diabetes mellitus with hyperlipidemia (HCC) 09/23/2023   AKI (acute kidney injury) (HCC) 09/23/2023   Benign hypertension with CKD (chronic kidney disease) stage III (HCC) 09/23/2023   Chronic health problem 09/22/2023   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) 09/22/2023   NSTEMI (non-ST elevated myocardial infarction) (HCC) 09/21/2023   Type 2 diabetes mellitus with diabetic chronic kidney disease (HCC) 09/21/2023   CKD (chronic kidney disease) 09/21/2023   Pulmonary nodule 09/21/2023   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) 01/24/2022   Non-cardiac chest pain    Coronary artery disease involving native coronary artery of native heart without angina pectoris    Hypercholesterolemia    Atypical angina (HCC) 01/22/2022   Low-level of literacy 05/21/2018   Globus sensation 03/12/2018   Type 2 diabetes mellitus without complication, with long-term current use of insulin  (HCC) 06/09/2017   Pure hypercholesterolemia 06/09/2017  History of coronary artery stent placement 06/09/2017   Essential hypertension, benign 06/09/2017   PCP:  Clem Currier, DO Pharmacy:   [facility] - [place], [place] - [address] [place] [address] Phone: [phone] Fax: [phone]  [facility] [address] [place] [address] Phone:  [phone] Fax: [phone]  [id number] - [place], [place] - [address] AT [facility] [address] [facility] [address] [place] [address] Phone: [phone] Fax: [phone]     Social Drivers of Health (SDOH) Social History: SDOH Screenings   Food Insecurity: No Food Insecurity (09/22/2023)  Housing: Low Risk  (09/22/2023)  Transportation Needs: No Transportation Needs (09/22/2023)  Utilities: Not At Risk (09/22/2023)  Alcohol Screen: Low Risk  (08/21/2023)  Depression (PHQ2-9): Low Risk  (08/21/2023)  Financial Resource Strain: Low Risk  (08/21/2023)  Physical Activity: Inactive (08/21/2023)  Social Connections: Socially Integrated (09/22/2023)  Stress: No Stress Concern Present (08/21/2023)  Tobacco Use: Medium Risk (09/27/2023)  Health Literacy: Adequate Health Literacy (08/21/2023)   SDOH Interventions:     Readmission Risk Interventions     No data to display

## 2023-10-04 NOTE — Progress Notes (Addendum)
 Patient ID: [name], male   DOB: [birth year], 73 y.o.   MRN: [id number]     Advanced Heart Failure Rounding Note  Cardiologist: Hazle Lites, MD  Chief Complaint: Post cardiotomy shock Patient Profile   73 y.o. male with history of CAD, HFmrEF, uncontrolled DM II, HTN, HLD. Admitted with NSTEMI and acute on chronic CHF. Underwent CABG X 4 on 09/27/23 c/b postcardiotomy shock.  Subjective:    06/04: CABG X 4 with Dr. [name]. Difficulty separating from cardiopulmonary bypass 06/05: Extubated   POD #7  Co-ox 67. CVP 7. On Milrinone  0.125 mcg/kg/min Net negative 2.5L. On CRRT, UF 100/hr. Per nephrology, attempt to stop CRRT by EOD to start transition to iHD. Weight at admission weight.   Lying in bed. More alert. Feeling well this morning. Minimal pain. No SOB.   Objective:    Weight Range: 92.9 kg Body mass index is 35.17 kg/m.   Vital Signs:   Temp:  [97.9 F (36.6 C)-98.8 F (37.1 C)] 98.7 F (37.1 C) (06/11 0800) Pulse Rate:  [106-160] 119 (06/11 1005) Resp:  [6-29] 17 (06/11 1005) BP: (83-178)/(55-116) 101/70 (06/11 1005) SpO2:  [58 %-100 %] 96 % (06/11 1005) Weight:  [92.9 kg] 92.9 kg (06/11 0500) Last BM Date : 10/03/23  Weight change: Filed Weights   10/02/23 0500 10/03/23 0415 10/04/23 0500  Weight: (S) 97 kg 95.6 kg 92.9 kg   Intake/Output:  Intake/Output Summary (Last 24 hours) at 10/04/2023 1025 Last data filed at 10/04/2023 1000 Gross per 24 hour  Intake 293.35 ml  Output 2803.5 ml  Net -2510.15 ml    Physical Exam    CVP 7 General: Weak appearing. No distress on RA Cardiac: JVP ~6cm. S1 and S2 present. No murmurs or rub. Extremities: Warm and dry.  1+ edema in b/l thighs.  Neuro: Alert and oriented x3. Affect pleasant. Moves all extremities without difficulty. Lines/Devices:  LIJ HDL   Telemetry: ST 110-120s (personally reviewed)  Labs    CBC Recent Labs    10/03/23 0500 10/04/23 0531  WBC 12.3* 12.6*  HGB 10.1* 9.8*  HCT  31.9* 31.3*  MCV 89.6 89.7  PLT 195 223   Basic Metabolic Panel Recent Labs    84/13/24 0500 10/03/23 1705 10/04/23 0531  NA 136 134* 135  K 3.8 3.9 3.7  CL 102 99 102  CO2 24 22 24   GLUCOSE 77 244* 124*  BUN 13 18 17   CREATININE 1.75* 1.85* 1.90*  CALCIUM  8.1* 8.1* 8.3*  MG 2.6*  --  2.6*  PHOS 3.0 2.6 2.2*   Liver Function Tests Recent Labs    10/03/23 1705 10/04/23 0531  ALBUMIN  2.6* 2.5*   BNP (last 3 results) Recent Labs    09/21/23 0806  BNP 128.5*   Medications:    Scheduled Medications:  (feeding supplement) PROSource Plus  30 mL Oral TID WC   aspirin   81 mg Oral Daily   atorvastatin   80 mg Oral Daily   bisacodyl   10 mg Oral Daily   Or   bisacodyl   10 mg Rectal Daily   Chlorhexidine  Gluconate Cloth  6 each Topical Daily   clopidogrel  75 mg Oral Daily   docusate  200 mg Oral Daily   feeding supplement  1 Container Oral TID BM   heparin  injection (subcutaneous)  5,000 Units Subcutaneous Q8H   insulin  aspart  0-9 Units Subcutaneous Q4H   insulin  glargine-yfgn  10 Units Subcutaneous BID   metoCLOPramide  (REGLAN ) injection  10 mg  Intravenous Q8H   multivitamin  1 tablet Oral QHS   sodium chloride  flush  3 mL Intravenous Q12H   traZODone  50 mg Oral QHS   Infusions:  milrinone  0.125 mcg/kg/min (10/04/23 1000)   prismasol BGK 4/2.5 1,500 mL/hr at 10/04/23 0927   prismasol BGK 4/2.5 400 mL/hr at 10/04/23 0811   prismasol BGK 4/2.5 400 mL/hr at 10/04/23 0810   sodium PHOSPHATE IVPB (in mmol)     PRN Medications: dextrose , levalbuterol, ondansetron  (ZOFRAN ) IV, mouth rinse, oxyCODONE , phenol, sodium chloride  flush, traMADol   Assessment/Plan   1. Acute systolic CHF/Cardiogenic shock: Ischemic cardiomyopathy. Echo pre-CABG with EF 35-40%, RV hard to visualize. Difficulty separating from cardiopulmonary bypass following CABG 09/27/23. Placed on low dose milrinone  0.125 for RV support, now off. Co-ox 67%. CVVHD for volume removal and clearance - Stop  Milrinone . Volume status much improved. Repeat coox this afternoon.  - CVP 7. Remains anuric. CRRT for volume removal until EOD or circuit goes down to transition to iHD.   2. CAD: NSTEMI. Hx PCI to LAD in 2005.  NSTEMI 09/21/23 with multivessel disease on LHC. S/p CABG X 4 (LIMA to LAD, SVG to OM1, sequential to OM3, SVG to PDA) on 09/27/23 - Continue ASA + plavix 75 mg daily - Continue atorva 80. LDL 121 (not adherent with home meds prior to admit)  3. Acute Renal Failure: CKD 3 at baseline. Admission creatinine was 1.9. Creatinine peaked above 4, worsening uremic symptoms. Started on CRRT for clearance and volume removal. - On CRRT. Hope for renal recovery, however remains anuric.  - Transitioning to iHD per nephrology, stopping CRRT this afternoon.  4. Anemia: Post-op, hgb stable.  5. DM II: Uncontrolled, A1c 11.3% - Insulin  per TCTS  Length of Stay: 13  CRITICAL CARE Performed by: [place] Lee  Total critical care time: 11 minutes  -Critical care time was exclusive of separately billable procedures and treating other patients. -Critical care was necessary to treat or prevent imminent or life-threatening deterioration. -Critical care was time spent personally by me on the following activities: development of treatment plan with patient and/or surrogate as well as nursing, discussions with consultants, evaluation of patient's response to treatment, examination of patient, obtaining history from patient or surrogate, ordering and performing treatments and interventions, ordering and review of laboratory studies, ordering and review of radiographic studies, pulse oximetry and re-evaluation of patient's condition.  [place] Lee, NP 10/04/2023, 10:25 AM  Advanced Heart Failure Team Pager [phone] (M-F; 7a - 5p)   Agree with above.   Remains on milrinone  for RV support and CVVHD. Anuric. Feeling a bit better. No CP or SOB. Able to stand with help but too weak to walk. Rhythm stable   Ileus  improving slowly.   General:  Sitting up in bed  No resp difficulty HEENT: normal Neck: supple. no JVD. Carotids 2+ bilat; no bruits. No lymphadenopathy or thryomegaly appreciated. Cor: Regular rate & rhythm. No rubs, gallops or murmurs. Lungs: clear Abdomen: nontender, +distended/tympanitic hypoactive bowel sounds. Extremities: no cyanosis, clubbing, rash, tr-1+ edema Neuro: alert & orientedx3, cranial nerves grossly intact. moves all 4 extremities w/o difficulty. Affect pleasant  Remains tenuous. Continue CVVHD one more day then will trial iHD. Ok to stop milrinone  Continue to ambulate.   CRITICAL CARE Performed by: Jules Oar  Total critical care time: 36 minutes  Critical care time was exclusive of separately billable procedures and treating other patients.  Critical care was necessary to treat or prevent imminent or life-threatening deterioration.  Critical care was time spent personally by me (independent of midlevel providers or residents) on the following activities: development of treatment plan with patient and/or surrogate as well as nursing, discussions with consultants, evaluation of patient's response to treatment, examination of patient, obtaining history from patient or surrogate, ordering and performing treatments and interventions, ordering and review of laboratory studies, ordering and review of radiographic studies, pulse oximetry and re-evaluation of patient's condition.  Jules Oar, MD  4:15 PM

## 2023-10-05 DIAGNOSIS — I5023 Acute on chronic systolic (congestive) heart failure: Secondary | ICD-10-CM | POA: Diagnosis not present

## 2023-10-05 DIAGNOSIS — R570 Cardiogenic shock: Secondary | ICD-10-CM | POA: Diagnosis not present

## 2023-10-05 DIAGNOSIS — N1831 Chronic kidney disease, stage 3a: Secondary | ICD-10-CM | POA: Diagnosis not present

## 2023-10-05 DIAGNOSIS — I214 Non-ST elevation (NSTEMI) myocardial infarction: Secondary | ICD-10-CM | POA: Diagnosis not present

## 2023-10-05 DIAGNOSIS — R5381 Other malaise: Secondary | ICD-10-CM

## 2023-10-05 LAB — RENAL FUNCTION PANEL
Albumin: 2.4 g/dL — ABNORMAL LOW (ref 3.5–5.0)
Anion gap: 9 (ref 5–15)
BUN: 25 mg/dL — ABNORMAL HIGH (ref 8–23)
CO2: 25 mmol/L (ref 22–32)
Calcium: 8.2 mg/dL — ABNORMAL LOW (ref 8.9–10.3)
Chloride: 102 mmol/L (ref 98–111)
Creatinine, Ser: 2.96 mg/dL — ABNORMAL HIGH (ref 0.61–1.24)
GFR, Estimated: 22 mL/min — ABNORMAL LOW (ref >60)
Glucose, Bld: 93 mg/dL (ref 70–99)
Phosphorus: 3.2 mg/dL (ref 2.5–4.6)
Potassium: 3.7 mmol/L (ref 3.5–5.1)
Sodium: 136 mmol/L (ref 135–145)

## 2023-10-05 LAB — COOXEMETRY PANEL
Carboxyhemoglobin: 1.6 % — ABNORMAL HIGH (ref 0.5–1.5)
Methemoglobin: <0.7 % (ref 0.0–1.5)
O2 Saturation: 75.6 %
Total hemoglobin: 9.4 g/dL — ABNORMAL LOW (ref 12.0–16.0)

## 2023-10-05 LAB — GLUCOSE, CAPILLARY
Glucose-Capillary: 92 mg/dL (ref 70–99)
Glucose-Capillary: 88 mg/dL (ref 70–99)
Glucose-Capillary: 116 mg/dL — ABNORMAL HIGH (ref 70–99)
Glucose-Capillary: 140 mg/dL — ABNORMAL HIGH (ref 70–99)
Glucose-Capillary: 116 mg/dL — ABNORMAL HIGH (ref 70–99)

## 2023-10-05 LAB — HEPATITIS B SURFACE ANTIGEN: Hepatitis B Surface Ag: NONREACTIVE

## 2023-10-05 LAB — MAGNESIUM: Magnesium: 2.8 mg/dL — ABNORMAL HIGH (ref 1.7–2.4)

## 2023-10-05 MED ORDER — INSULIN ASPART 100 UNIT/ML IJ SOLN
0.0000 [IU] | Freq: Three times a day (TID) | INTRAMUSCULAR | Status: DC
  Administered 2023-10-05: 1 [IU] via SUBCUTANEOUS
  Administered 2023-10-06 (×2): 2 [IU] via SUBCUTANEOUS
  Administered 2023-10-07: 5 [IU] via SUBCUTANEOUS
  Administered 2023-10-07 (×2): 2 [IU] via SUBCUTANEOUS
  Administered 2023-10-08: 3 [IU] via SUBCUTANEOUS
  Administered 2023-10-08: 2 [IU] via SUBCUTANEOUS
  Administered 2023-10-08: 3 [IU] via SUBCUTANEOUS
  Administered 2023-10-09: 1 [IU] via SUBCUTANEOUS
  Administered 2023-10-09: 3 [IU] via SUBCUTANEOUS
  Administered 2023-10-09: 5 [IU] via SUBCUTANEOUS
  Administered 2023-10-10: 3 [IU] via SUBCUTANEOUS
  Administered 2023-10-10 – 2023-10-11 (×3): 2 [IU] via SUBCUTANEOUS
  Administered 2023-10-11: 1 [IU] via SUBCUTANEOUS
  Administered 2023-10-11 – 2023-10-12 (×2): 2 [IU] via SUBCUTANEOUS
  Administered 2023-10-12: 5 [IU] via SUBCUTANEOUS
  Administered 2023-10-12 – 2023-10-13 (×2): 2 [IU] via SUBCUTANEOUS

## 2023-10-05 MED ORDER — INSULIN GLARGINE-YFGN 100 UNIT/ML ~~LOC~~ SOLN
7.0000 [IU] | Freq: Two times a day (BID) | SUBCUTANEOUS | Status: DC
  Administered 2023-10-05 (×2): 7 [IU] via SUBCUTANEOUS
  Filled 2023-10-05 (×4): qty 0.07

## 2023-10-05 MED ORDER — POTASSIUM CHLORIDE CRYS ER 20 MEQ PO TBCR
20.0000 meq | EXTENDED_RELEASE_TABLET | ORAL | Status: DC
  Administered 2023-10-05: 20 meq via ORAL
  Filled 2023-10-05: qty 1

## 2023-10-05 MED ORDER — CHLORHEXIDINE GLUCONATE CLOTH 2 % EX PADS
6.0000 | MEDICATED_PAD | Freq: Every day | CUTANEOUS | Status: DC
Start: 2023-10-06 — End: 2023-10-13
  Administered 2023-10-06 – 2023-10-11 (×6): 6 via TOPICAL

## 2023-10-05 NOTE — Progress Notes (Signed)
 NAME:  [name], MRN:  [id number], DOB:  05-Nov-1950, LOS: 14 ADMISSION DATE:  09/21/2023, CONSULTATION DATE: 09/28/2023 REFERRING MD: Dr. [name], CHIEF COMPLAINT: Status post CABG x 4  History of Present Illness:  73 year old male with coronary artery disease, diabetes type 2, hypertension, CKD stage IIIa, hyperlipidemia and chronic HFrEF who initially presented about a week ago with epigastric pain radiating to the chest, was admitted with acute NSTEMI, he underwent cardiac cath which showed severe multivessel coronary artery disease with elevated LVEDP.  Patient underwent CABG x 4, he had decreased urine output post CABG, he was not extubated per rapid weaning protocol, remain on vasopressor support with Levophed , epinephrine  and milrinone .  PCCM was consulted for help evaluation medical management  During my evaluation, patient is awake, following commands, tolerating spontaneous breathing trial.  Pertinent  Medical History   Past Medical History:  Diagnosis Date   CAD (coronary artery disease)    Diabetes mellitus without complication (HCC)    Hypercholesterolemia    Hypertension    MI (myocardial infarction) (HCC)    Renal insufficiency     Significant Hospital Events: Including procedures, antibiotic start and stop dates in addition to other pertinent events   5/29 admitted with acute NSTEMI 6/4 CABG x 4 6/5 consulted PCCM 6/7 start CRRT, bowel movements after enemas. Minimal NGT output.  6/8 foley removed, able to start mobility, started clear liquids 6/10 d/c RIJ introducer, con't milrinone . 6/11 On CRRT yesterday, worked with PT.  Afebrile. He denies complaints today. 6/12 no issues overnight remains off all continuous infusions and off CRRT  Interim History / Subjective:  Seen sitting up in bed alert and interactive, with no acute complaints  Objective    Blood pressure 92/66, pulse (!) 103, temperature 98.5 F (36.9 C), temperature source Oral, resp. rate 14,  height 5' 4 (1.626 m), weight 92.1 kg, SpO2 93%. CVP:  [1 mmHg-19 mmHg] 7 mmHg      Intake/Output Summary (Last 24 hours) at 10/05/2023 0736 Last data filed at 10/05/2023 0600 Gross per 24 hour  Intake 423.36 ml  Output 1519.6 ml  Net -1096.24 ml   Filed Weights   10/03/23 0415 10/04/23 0500 10/05/23 0500  Weight: 95.6 kg 92.9 kg 92.1 kg    Examination: General: Well-appearing middle-aged male lying in bed resting in no acute distress HEENT: [place]/AT, MM pink/moist, PERRL,  Neuro: Alert and oriented x 3, nonfocal CV: s1s2 regular rate and rhythm, no murmur, rubs, or gallops,  PULM: Clear to auscultation bilaterally, no increased work of breathing, no added breath sounds GI: soft, bowel sounds active in all 4 quadrants, non-tender, non-distended, tolerating oral diet Extremities: warm/dry, no edema  Skin: no rashes or lesions  Resolved problem list  Acute pulmonary edema, acute respiratory failure with hypoxia Acute metabolic encephalopathy likely due to renal failure; improved today  Assessment and Plan  Multivessel coronary artery disease,  acute NSTEMI s/p CABG x 4 Post-bypass cardioplegia requiring inotropic support Acute on chronic HFrEF -EF 35 to 40% Post cardiotomy shock - Milrinone  stopped 6/11 P: Continuous telemetry Continue DAPT and statin Ensure Multimodal pain control Continue PPI Mobilize as able Encourage oral intake GDMT per cardiology  Leukocytosis -CXR reassuring no pneumonia P: Continue to trend CBC and fever curve off antibiotics  AKI on CKD stage IIIa, likely ATN P: Creatinine slightly up trended today 6/12 after stopping CRRT, continue to monitor for renal recovery Keep HD line for now Strict intake and output Avoid hepatotoxins  Essential hypertension hypertension  Hyperlipidemia P: Home antihypertensives remain on hold, resumption when appropriate Continue home statin  Poorly-controlled diabetes type 2 with hyperglycemia-  controlled -A1c 11.3 P: CBC slightly low a.m home Glargine, reduced to 7 units twice daily CBG goal 140-180 Continue sensitive scale SSI CBG checks ACHS  Expected perioperative blood loss anemia P: Trend CBC Transfuse per protocol Hemoglobin goal greater than 7  Insomnia P: Continue nocturnal trazodone Ensure sleep-wake cycle Mobilize during day  Post-op ileus, improved constipation- resolved P: Continue bowel regiment Mobilize  Deconditioning P: PT/OT efforts Likely good candidate for CIR  Best Practice (right click and Reselect all SmartList Selections daily)   Diet/type: renal diet DVT prophylaxis: [place] heparin  GI prophylaxis: PPI Lines: HD cath, Arterial Line Foley:  d/c 6/8 Code Status:  full code Last date of multidisciplinary goals of care discussion: Patient alert and interactive this a.m. updated, no family at bedside   Anthone Prieur D. Harris, NP-C [facility] Personal contact information can be found on Amion  If no contact or response made please call [phone] 10/05/2023, 8:51 AM

## 2023-10-05 NOTE — Progress Notes (Addendum)
 Patient ID: [name], male   DOB: 1951-02-21, 73 y.o.   MRN: [id number]     Advanced Heart Failure Rounding Note  Cardiologist: Hazle Lites, MD  Chief Complaint: Post cardiotomy shock Patient Profile   73 y.o. male with history of CAD, HFmrEF, uncontrolled DM II, HTN, HLD. Admitted with NSTEMI and acute on chronic CHF. Underwent CABG X 4 on 09/27/23 c/b postcardiotomy shock.  Subjective:    06/04: CABG X 4 with Dr. [name]. Difficulty separating from cardiopulmonary bypass 06/05: Extubated   POD #8   Co-ox stable off milrinone , 76%.  Off CRRT. Remains anuric.   Sitting up in bed. Feels ok. No dyspnea. No current complaints.    Objective:    Weight Range: 92.1 kg Body mass index is 34.85 kg/m.   Vital Signs:   Temp:  [97.9 F (36.6 C)-99.2 F (37.3 C)] 99.2 F (37.3 C) (06/12 0833) Pulse Rate:  [98-147] 107 (06/12 1100) Resp:  [11-30] 21 (06/12 1100) BP: (83-142)/(60-98) 136/65 (06/12 1100) SpO2:  [92 %-100 %] 95 % (06/12 1100) Weight:  [92.1 kg] 92.1 kg (06/12 0500) Last BM Date : 10/03/23  Weight change: Filed Weights   10/03/23 0415 10/04/23 0500 10/05/23 0500  Weight: 95.6 kg 92.9 kg 92.1 kg   Intake/Output:  Intake/Output Summary (Last 24 hours) at 10/05/2023 1212 Last data filed at 10/05/2023 0600 Gross per 24 hour  Intake 242.98 ml  Output 725.2 ml  Net -482.22 ml    Physical Exam   General:  Well appearing, sitting up in bed. No respiratory difficulty HEENT: normal Neck: supple. JVD not elevated. Lt internal jugular HD cath  Cor: PMI nondisplaced. Regular rate & rhythm.  Lungs: clear Abdomen: soft, nontender, nondistended.  Extremities: no cyanosis, clubbing, rash, edema Neuro: alert & oriented x 3, cranial nerves grossly intact. moves all 4 extremities w/o difficulty. Affect pleasant.  Telemetry: ST 110s (personally reviewed)  Labs    CBC Recent Labs    10/03/23 0500 10/04/23 0531  WBC 12.3* 12.6*  HGB 10.1* 9.8*  HCT 31.9*  31.3*  MCV 89.6 89.7  PLT 195 223   Basic Metabolic Panel Recent Labs    33/29/51 0531 10/04/23 1606 10/05/23 0400  NA 135 136 136  K 3.7 4.1 3.7  CL 102 100 102  CO2 24 25 25   GLUCOSE 124* 138* 93  BUN 17 18 25*  CREATININE 1.90* 1.82* 2.96*  CALCIUM  8.3* 8.4* 8.2*  MG 2.6*  --  2.8*  PHOS 2.2* 2.9 3.2   Liver Function Tests Recent Labs    10/04/23 1606 10/05/23 0400  ALBUMIN  2.6* 2.4*   BNP (last 3 results) Recent Labs    09/21/23 0806  BNP 128.5*   Medications:    Scheduled Medications:  (feeding supplement) PROSource Plus  30 mL Oral TID WC   alteplase  2 mg Intracatheter Once   aspirin   81 mg Oral Daily   atorvastatin   80 mg Oral Daily   bisacodyl   10 mg Oral Daily   Or   bisacodyl   10 mg Rectal Daily   Chlorhexidine  Gluconate Cloth  6 each Topical Daily   clopidogrel  75 mg Oral Daily   docusate  200 mg Oral Daily   feeding supplement  1 Container Oral TID BM   heparin  injection (subcutaneous)  5,000 Units Subcutaneous Q8H   insulin  aspart  0-9 Units Subcutaneous TID WC   insulin  aspart  2 Units Subcutaneous TID WC   insulin  glargine-yfgn  7 Units  Subcutaneous BID   multivitamin  1 tablet Oral QHS   sodium chloride  flush  3 mL Intravenous Q12H   traZODone  50 mg Oral QHS   Infusions:   PRN Medications: dextrose , levalbuterol, ondansetron  (ZOFRAN ) IV, mouth rinse, oxyCODONE , phenol, sodium chloride  flush, traMADol   Assessment/Plan   1. Acute systolic CHF/Cardiogenic shock: Ischemic cardiomyopathy. Echo pre-CABG with EF 35-40%, RV hard to visualize. Difficulty separating from cardiopulmonary bypass following CABG 09/27/23. Placed on low dose milrinone  0.125 for RV support, now off. Co-ox 76%.  - co-ox stable off milrinone  - now off CRRT, remains anuric. Volume status ok. No immediate need for iHD. Nephrology following and assessing daily   2. CAD: NSTEMI. Hx PCI to LAD in 2005.  NSTEMI 09/21/23 with multivessel disease on LHC. S/p CABG X 4 (LIMA to  LAD, SVG to OM1, sequential to OM3, SVG to PDA) on 09/27/23 - Continue ASA + plavix 75 mg daily - GDMT limited by renal fx  - Continue atorva 80. LDL 121 (not adherent with home meds prior to admit)  3. Acute Renal Failure: CKD 3 at baseline. Admission creatinine was 1.9. Creatinine peaked above 4, worsening uremic symptoms. Started on CRRT for clearance and volume removal. - Now off CRRT. Hope for renal recovery, however remains anuric.  - no immediate need for iHD today. Nephrology following   4. Anemia: Post-op, hgb stable.  5. DM II: Uncontrolled, A1c 11.3% - Insulin  per TCTS  Length of Stay: [phone], PA-C 10/05/2023, 12:12 PM  Advanced Heart Failure Team Pager [phone] (M-F; 7a - 5p)   Patient seen and examined with the above-signed Advanced Practice Provider and/or Housestaff. I personally reviewed laboratory data, imaging studies and relevant notes. I independently examined the patient and formulated the important aspects of the plan. I have edited the note to reflect any of my changes or salient points. I have personally discussed the plan with the patient and/or family.  Now off CRRT and milrinone . Co-ox ok. CVP 5. Remains anuric. In NSR  Denies CP or SOB. Ileus resolving  General: Sitting up in bed  No resp difficulty HEENT: normal Neck: supple. no JVD. + HD cath Cor: Regular tachy Lungs: clear Abdomen: soft, nontender, mildly distended.  Good bowel sounds. Extremities: no cyanosis, clubbing, rash, edema Neuro: alert & orientedx3, cranial nerves grossly intact. moves all 4 extremities w/o difficulty. Affect pleasant  Post-op RV failure continues to improve. Now off milrinone  with stable co-ox. Volume status looks good. Off CVVHD now. Will likely need trial of iHD in the next day or two.   Continue PT/OT  Jules Oar, MD  12:41 PM

## 2023-10-05 NOTE — Progress Notes (Deleted)
 Cardiology Clinic Note   Patient Name: [name] Date of Encounter: 10/05/2023  Primary Care Provider:  Clem Currier, DO Primary Cardiologist:  Hazle Lites, MD  Patient Profile    [name] 73 year old male presents to the clinic today for follow-up evaluation essential hypertension and CHF.  Past Medical History    Past Medical History:  Diagnosis Date   CAD (coronary artery disease)    Diabetes mellitus without complication (HCC)    Hypercholesterolemia    Hypertension    MI (myocardial infarction) (HCC)    Renal insufficiency    Past Surgical History:  Procedure Laterality Date   CORONARY ANGIOPLASTY WITH STENT PLACEMENT  08/01/2003   Proximal LAD   CORONARY ARTERY BYPASS GRAFT N/A 09/27/2023   Procedure: CORONARY ARTERY BYPASS GRAFTING (CABG) TIMES FOUR UTILIZING THE LEFT INTERNAL MAMMARY ARTERY AND ENDOSCOPIC VEIN HARVEST RIGHT GREATER SAPHENOUS VEIN;  Surgeon: [name], MD;  Location: [facility] OR;  Service: Open Heart Surgery;  Laterality: N/A;   INTRAOPERATIVE TRANSESOPHAGEAL ECHOCARDIOGRAM N/A 09/27/2023   Procedure: ECHOCARDIOGRAM, TRANSESOPHAGEAL, INTRAOPERATIVE;  Surgeon: [name], MD;  Location: [facility] OR;  Service: Open Heart Surgery;  Laterality: N/A;   LEFT HEART CATH AND CORONARY ANGIOGRAPHY N/A 09/21/2023   Procedure: LEFT HEART CATH AND CORONARY ANGIOGRAPHY;  Surgeon: Kyra Phy, MD;  Location: [facility] INVASIVE CV LAB;  Service: Cardiovascular;  Laterality: N/A;    Allergies  Allergies  Allergen Reactions   Trulicity [Dulaglutide] Other (See Comments)    Patient indicated this caused pancreatitis.     History of Present Illness    [name] is a PMH of HTN, atypical angina, CHF type 2 diabetes, AKI, HLD, and low-level literacy.  He had PCI to his LAD in 2005.  He was admitted to the hospital on 10 1 and discharged on 01/24/2022.  He presented with chest discomfort.  High-sensitivity troponins were flat and he did report history  of dyspnea on exertion.  His chest x-ray and CTA were negative.  Cardiology was consulted.  No further testing was recommended.  His lipid panel showed an LDL of 104 compared to previously being 141.  His LDL goal is less than 70.  He was continued on his home aspirin  and simvastatin.  His pain improved with lidocaine  patch.  His pain was felt to be related to MSK.  His echocardiogram showed a newly reduced EF of 45-50%.  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  He was discharged on carvedilol , amlodipine , Jardiance , rosuvastatin  and aspirin .  He was noted to be hypertensive during his hospitalization however no as needed antihypertensive medications were needed.  His home type 2 diabetes with medication was continued.  His A1c was noted to be 8.9.  He presented to the clinic 10/23 for follow-up evaluation and stated he continued to have left shoulder pain that wraped around through his axillary region.  He noticeed pain increased with movement.  We reviewed his emergency department visit.  He expressed understanding.  I reassured him that his pain was not related to his heart and was related to muscle pain/discomfort.  His blood pressure was slightly elevated initially  at 142/98 and on recheck is 138/80.  He reported that his blood pressure at home was routinely in the 140s systolic.  I ordered physical therapy for his left upper extremity eval and treat, increased his carvedilol  to 25 mg twice daily, repeated echocardiogram in 3 months and planned follow-up for 4  months.  He presented to the clinic 09/15/22 for follow-up evaluation and stated he had been having urinary urgency.  He denied burning urination and malodorous urine.  He misunderstood his carvedilol  instructions and had only been taking 12 and half milligrams twice daily.  His blood pressure initially was 162/100 and on recheck was 138/82.  His biggest complaint was with urinary symptoms.  I  changed his carvedilol  prescription,  planned echocardiogram in 2 months, and follow-up in 3 to 4 months.   He was admitted to the hospital on 09/21/2023***  He presents to the clinic today for follow-up evaluation states***.  Today he  denies  shortness of breath, lower extremity edema, fatigue, palpitations, melena, hematuria, hemoptysis, diaphoresis, weakness, presyncope, and syncope.  Acute on chronic CHF-weight today***.  Euvolemic***.  Required midodrine therapy post MI.  Echocardiogram preWhich was 30-35%.  He required CRRT.  Nephrology was consulted and he required management by advanced heart failure team. Daily weights and weight log Continue carvedilol *** Elevate lower extremities when not active Lower extremity support stockings Following with advanced heart failure team  Cardiomyopathy-***has returned to normal daily activities.  Cardiomyopathy felt to be related to hypertension.  Misunderstood carvedilol  prescription.  He was only taking 12 and half milligrams twice daily Increase carvedilol  to 25 mg twice daily Continue Jardiance , amlodipine , rosuvastatin , aspirin  Currently low-sodium high-fiber diet Increase physical activity as tolerated Repeat echocardiogram in 1-2 months    Coronary artery disease-denies exertional chest pain.  Has resumed normal daily activities.  Previous PCI in 2005 to his proximal LAD.  He is status post CABG x 4 09/27/2023. Continue current medical regimen  Hyperlipidemia-LDL*** Continue aspirin , rosuvastatin  Heart healthy low-sodium high-fiber diet.   Increase physical activity as tolerated Repeat fasting lipids and LFTs in 6 weeks  Essential hypertension-BP today 138***/82 Continue  carvedilol   Maintain blood pressure log Heart healthy low-sodium diet    Disposition: Follow-up with Dr. Maximo Spar or me in 3-4 months.   Home Medications    Prior to Admission medications   Medication Sig Start Date End Date Taking? Authorizing Provider  Accu-Chek Softclix Lancets lancets USE  AS DIRECTED UP TO FOUR TIMES DAILY 06/10/19   Shirley, [place], DO  amLODipine  (NORVASC ) 10 MG tablet TAKE 1 TABLET(10 MG) BY MOUTH AT BEDTIME Patient taking differently: Take 10 mg by mouth at bedtime. 04/28/21   Lilland, Alana, DO  aspirin  EC 81 MG tablet Take 81 mg by mouth daily.    [provider]  blood glucose meter kit and supplies Dispense based on patient and insurance preference. Use up to four times daily as directed. (FOR ICD-10 E10.9, E11.9). 05/13/20   Lilland, Alana, DO  carvedilol  (COREG ) 12.5 MG tablet Take 1 tablet (12.5 mg total) by mouth 2 (two) times daily with a meal. 01/24/22   Edison Gore, MD  empagliflozin  (JARDIANCE ) 10 MG TABS tablet Take 1 tablet (10 mg total) by mouth daily. 08/23/21   Lilland, Alana, DO  FIASP  FLEXTOUCH 100 UNIT/ML FlexTouch Pen INJECT 10 UNITS INTO THE SKIN DAILY BEFORE SUPPER 10/15/21   Lilland, Alana, DO  glucose blood (ACCU-CHEK GUIDE) test strip Use as instructed 05/21/20   Anibal Kent, MD  Insulin  Glargine (BASAGLAR  KWIKPEN) 100 UNIT/ML SOPN Inject 0.3 mLs (30 Units total) into the skin daily. Patient taking differently: Inject 10 Units into the skin daily. 11/15/18   Anibal Kent, MD  Insulin  Pen Needle (BD PEN NEEDLE NANO 2ND GEN) 32G X 4 MM MISC Inject 1 Container into the skin 3 times/day  as needed-between meals & bedtime. USE AS DIRECTED DAILY 09/10/21   Lilland, Alana, DO  lidocaine  (LIDODERM ) 5 % Place 1 patch onto the skin daily. Remove & Discard patch within 12 hours or as directed by MD 01/25/22   Edison Gore, MD  metFORMIN  (GLUCOPHAGE ) 500 MG tablet TAKE 2 TABLETS(1000 MG) BY MOUTH TWICE DAILY WITH A MEAL Patient taking differently: Take 500 mg by mouth at bedtime. 06/10/21   Lilland, Alana, DO  polyethylene glycol powder (GLYCOLAX /MIRALAX ) 17 GM/SCOOP powder Take 1 capful (17 g) with water by mouth daily. 01/25/22   Edison Gore, MD  rosuvastatin  (CRESTOR ) 40 MG tablet TAKE 1 TABLET(40 MG) BY MOUTH DAILY Patient taking  differently: Take 40 mg by mouth daily. 07/16/21   Lilland, Alana, DO    Family History    Family History  Problem Relation Age of Onset   Heart disease Mother    Diabetes Mother    Hypertension Mother    Alcoholism Father    Heart disease Father    Hypertension Sister    Diabetes Brother    Hypercholesterolemia Brother    Osteoporosis Brother    Hypertension Brother    Kidney disease Brother    He indicated that his mother is deceased. He indicated that his father is deceased. He indicated that the status of his sister is unknown. He indicated that the status of his brother is unknown.  Social History    Social History   Socioeconomic History   Marital status: Married    Spouse name: Bartholomew Light   Number of children: 4   Years of education: 10   Highest education level: 10th grade  Occupational History   Occupation: retired  Tobacco Use   Smoking status: Former    Current packs/day: 0.00    Average packs/day: 0.5 packs/day for 10.0 years (5.0 ttl pk-yrs)    Types: Cigarettes    Start date: 04/26/1983    Quit date: 04/25/1993    Years since quitting: 30.4   Smokeless tobacco: Never  Vaping Use   Vaping status: Never Used  Substance and Sexual Activity   Alcohol use: No   Drug use: No   Sexual activity: Yes  Other Topics Concern   Not on file  Social History Narrative   Patient lives with his wife in [place].   Patent has 4 boys, one lives in [place] .    Patient enjoys fishing, camping in the woods, working outside, anything outdoor related.   Social Drivers of Corporate investment banker Strain: Low Risk  (08/21/2023)   Overall Financial Resource Strain (CARDIA)    Difficulty of Paying Living Expenses: Not very hard  Food Insecurity: No Food Insecurity (09/22/2023)   Hunger Vital Sign    Worried About Running Out of Food in the Last Year: Never true    Ran Out of Food in the Last Year: Never true  Transportation Needs: No Transportation Needs (09/22/2023)    PRAPARE - Administrator, Civil Service (Medical): No    Lack of Transportation (Non-Medical): No  Physical Activity: Inactive (08/21/2023)   Exercise Vital Sign    Days of Exercise per Week: 0 days    Minutes of Exercise per Session: 0 min  Stress: No Stress Concern Present (08/21/2023)   [employer] of Occupational Health - Occupational Stress Questionnaire    Feeling of Stress : Not at all  Social Connections: Socially Integrated (09/22/2023)   Social Connection and Isolation Panel    Frequency of  Communication with Friends and Family: More than three times a week    Frequency of Social Gatherings with Friends and Family: More than three times a week    Attends Religious Services: More than 4 times per year    Active Member of [employer] or Organizations: Yes    Attends Banker Meetings: Never    Marital Status: Married  Catering manager Violence: Not At Risk (09/22/2023)   Humiliation, Afraid, Rape, and Kick questionnaire    Fear of Current or Ex-Partner: No    Emotionally Abused: No    Physically Abused: No    Sexually Abused: No     Review of Systems    General:  No chills, fever, night sweats or weight changes.  Cardiovascular:  No chest pain, dyspnea on exertion, edema, orthopnea, palpitations, paroxysmal nocturnal dyspnea. Dermatological: No rash, lesions/masses Respiratory: No cough, dyspnea Urologic: No hematuria, dysuria Abdominal:   No nausea, vomiting, diarrhea, bright red blood per rectum, melena, or hematemesis Neurologic:  No visual changes, wkns, changes in mental status. All other systems reviewed and are otherwise negative except as noted above.  Physical Exam    VS:  There were no vitals taken for this visit. , BMI There is no height or weight on file to calculate BMI. GEN: Well nourished, well developed, in no acute distress. HEENT: normal. Neck: Supple, no JVD, carotid bruits, or masses. Cardiac: RRR, no murmurs, rubs, or gallops.  No clubbing, cyanosis, edema.  Radials/DP/PT 2+ and equal bilaterally.  Respiratory:  Respirations regular and unlabored, clear to auscultation bilaterally. GI: Soft, nontender, nondistended, BS + x 4. MS: no deformity or atrophy.  Skin: warm and dry, no rash. Neuro:  Strength and sensation are intact. Psych: Normal affect.  Accessory Clinical Findings    Recent Labs: 07/24/2023: TSH 3.030 09/21/2023: B Natriuretic Peptide 128.5 09/30/2023: ALT 5 10/04/2023: Hemoglobin 9.8; Platelets 223 10/05/2023: BUN 25; Creatinine, Ser 2.96; Magnesium  2.8; Potassium 3.7; Sodium 136   Recent Lipid Panel    Component Value Date/Time   CHOL 214 (H) 09/26/2023 0351   CHOL 365 (H) 03/01/2023 1119   TRIG 66 09/28/2023 0415   HDL 59 09/26/2023 0351   HDL 73 03/01/2023 1119   CHOLHDL 3.6 09/26/2023 0351   VLDL 34 09/26/2023 0351   LDLCALC 121 (H) 09/26/2023 0351   LDLCALC 256 (H) 03/01/2023 1119   LDLDIRECT 56 09/10/2020 0947         ECG personally reviewed by me today-none today.  Echocardiogram 01/23/2022  IMPRESSIONS     1. Left ventricular ejection fraction, by estimation, is 45 to 50%. Left  ventricular ejection fraction by PLAX is 46 %. The left ventricle has  mildly decreased function. The left ventricle demonstrates global  hypokinesis. There is mild left ventricular  hypertrophy. Left ventricular diastolic parameters are consistent with  Grade I diastolic dysfunction (impaired relaxation). Elevated left  ventricular end-diastolic pressure. The E/e' is 16.   2. Right ventricular systolic function is mildly reduced. The right  ventricular size is normal.   3. The mitral valve is abnormal. Trivial mitral valve regurgitation.   4. The aortic valve is tricuspid. Aortic valve regurgitation is not  visualized. Aortic valve sclerosis is present, with no evidence of aortic  valve stenosis.   5. The inferior vena cava is normal in size with greater than 50%  respiratory variability,  suggesting right atrial pressure of 3 mmHg.   Comparison(s): No prior Echocardiogram.  Assessment & Plan   1.  ***  Chet Cota. Cian Costanzo NP-C     10/05/2023, 1:15 PM [facility] [address] Office [phone] Fax [phone]  Notice: This dictation was prepared with Dragon dictation along with smaller phrase technology. Any transcriptional errors that result from this process are unintentional and may not be corrected upon review.  I spent 14*** minutes examining this patient, reviewing medications, and using patient centered shared decision making involving her cardiac care.  Prior to her visit I spent greater than 20 minutes reviewing her past medical history,  medications, and prior cardiac tests.

## 2023-10-05 NOTE — Progress Notes (Signed)
 8 Days Post-Op Procedure(s) (LRB): CORONARY ARTERY BYPASS GRAFTING (CABG) TIMES FOUR UTILIZING THE LEFT INTERNAL MAMMARY ARTERY AND ENDOSCOPIC VEIN HARVEST RIGHT GREATER SAPHENOUS VEIN (N/A) ECHOCARDIOGRAM, TRANSESOPHAGEAL, INTRAOPERATIVE (N/A) Subjective: Sleeping, no complaints when aroused  Objective: Vital signs in last 24 hours: Temp:  [97.9 F (36.6 C)-98.6 F (37 C)] 98.5 F (36.9 C) (06/11 2345) Pulse Rate:  [98-160] 103 (06/12 0700) Cardiac Rhythm: Sinus tachycardia (06/12 0630) Resp:  [11-30] 14 (06/12 0700) BP: (83-142)/(60-107) 92/66 (06/12 0700) SpO2:  [58 %-100 %] 93 % (06/12 0700) Weight:  [92.1 kg] 92.1 kg (06/12 0500)  Hemodynamic parameters for last 24 hours: CVP:  [1 mmHg-19 mmHg] 7 mmHg  Intake/Output from previous day: 06/11 0701 - 06/12 0700 In: 473.4 [P.O.:200; I.V.:18.1; IV Piggyback:255.3] Out: 1519.6  Intake/Output this shift: No intake/output data recorded.  General appearance: alert, cooperative, and no distress Neurologic: intact Heart: regular rate and rhythm Lungs: diminished breath sounds bibasilar Abdomen: + BS  Lab Results: Recent Labs    10/03/23 0500 10/04/23 0531  WBC 12.3* 12.6*  HGB 10.1* 9.8*  HCT 31.9* 31.3*  PLT 195 223   BMET:  Recent Labs    10/04/23 1606 10/05/23 0400  NA 136 136  K 4.1 3.7  CL 100 102  CO2 25 25  GLUCOSE 138* 93  BUN 18 25*  CREATININE 1.82* 2.96*  CALCIUM  8.4* 8.2*    PT/INR: No results for input(s): LABPROT, INR in the last 72 hours. ABG    Component Value Date/Time   PHART 7.336 (L) 09/28/2023 0854   HCO3 20.5 09/28/2023 0854   TCO2 22 09/28/2023 0854   ACIDBASEDEF 5.0 (H) 09/28/2023 0854   O2SAT 75.6 10/05/2023 0410   CBG (last 3)  Recent Labs    10/04/23 2212 10/04/23 2344 10/05/23 0405  GLUCAP 137* 129* 92    Assessment/Plan: S/P Procedure(s) (LRB): CORONARY ARTERY BYPASS GRAFTING (CABG) TIMES FOUR UTILIZING THE LEFT INTERNAL MAMMARY ARTERY AND ENDOSCOPIC VEIN  HARVEST RIGHT GREATER SAPHENOUS VEIN (N/A) ECHOCARDIOGRAM, TRANSESOPHAGEAL, INTRAOPERATIVE (N/A) POD # 8 NEURO- intact CV- in Sr, co-ox 75 off milrinone   ASA, Plavix, atorvastatin   ? Add low dose beta blocker- will defer to AHF team RESP- continue IS, pulmonary hygiene RENAL- off CRRT  Creatinine 2.95, BUN 25  K 3.7  HD per Nephrology ENDO- CBG well controlled  Change SSI to Ac and HS Gi- tolerating diet Deconditioning- severe  PT/OT     LOS: 14 days    [name] 10/05/2023

## 2023-10-05 NOTE — Progress Notes (Signed)
 Physical Therapy Treatment Patient Details Name: [name] MRN: [id number] DOB: Jun 20, 1950 Today's Date: 10/05/2023   History of Present Illness 73 y.o. male presenting 09/21/23 with chest pain and elevated troponins. NSTEMI; 5/29 L heart cath with multivessel CAD, 6/04 CABGx4; 6/5 extubated; post-CABG cardioplegia requiring inotropic support; decr renal function with uremic encephalopathy, CRRT started 6/7 PMH: DM, MI, HTN    PT Comments  Patient eager to attempt ambulation. Continues with posterior bias in sitting (feet come up off the floor) and standing. Did improve with EVA walker in standing and having to propel the walker forward to walk. Ambulated 75 ft slowly with +2 assist (chair follow). Patient talking about going home soon.     If plan is discharge home, recommend the following: Two people to help with walking and/or transfers;Two people to help with bathing/dressing/bathroom;Direct supervision/assist for medications management;Direct supervision/assist for financial management;Assist for transportation;Help with stairs or ramp for entrance;Supervision due to cognitive status   Can travel by private vehicle        Equipment Recommendations  Rolling walker (2 wheels)    Recommendations for Other Services       Precautions / Restrictions Precautions Precautions: Sternal;Fall;Other (comment) (CRRT) Precaution Booklet Issued: No Recall of Precautions/Restrictions: Impaired Precaution/Restrictions Comments: pt able to recall that he should hold his pillow to chest, but cannot state why or no push, no pull     Mobility  Bed Mobility Overal bed mobility: Needs Assistance Bed Mobility: Sit to Sidelying         Sit to sidelying: Min assist General bed mobility comments: return to bed with step by step cues and light assist to raise legs    Transfers Overall transfer level: Needs assistance Equipment used:  (EVA walker) Transfers: Sit to/from Stand Sit to Stand: Min  assist, +2 physical assistance, Mod assist           General transfer comment: using momentum and holding pillow, stood from recliner x 2; pt with posterior lean initially and +2 mod assist and returned to sitting; second attempt came forward better with +2 min assist    Ambulation/Gait Ambulation/Gait assistance: Min assist, +2 safety/equipment Gait Distance (Feet): 75 Feet Assistive device: Blanca Bunch Gait Pattern/deviations: Step-through pattern, Decreased stride length, Shuffle   Gait velocity interpretation: <1.8 ft/sec, indicate of risk for recurrent falls   General Gait Details: poor foot clearance resulting in short steps; assist to maneuver EVA walker as pt very distracted looking in rooms and talking to passersby with drifting to his left   Stairs             Wheelchair Mobility     Tilt Bed    Modified Rankin (Stroke Patients Only)       Balance Overall balance assessment: Needs assistance Sitting-balance support: No upper extremity supported, Feet supported Sitting balance-Leahy Scale: Fair     Standing balance support: During functional activity, Bilateral upper extremity supported, Reliant on assistive device for balance Standing balance-Leahy Scale: Poor Standing balance comment: posterior bias                            Communication Communication Communication: Impaired Factors Affecting Communication: Hearing impaired  Cognition Arousal: Alert Behavior During Therapy: WFL for tasks assessed/performed   PT - Cognitive impairments: Attention, Initiation, Sequencing, Memory, Problem solving, Safety/Judgement                       PT -  Cognition Comments: required max cues to adhere to sternal precautions with each transfer; step by step cues for transfers (including using momentum); distracts himself when walking and needs cues to stay on task Following commands: Impaired Following commands impaired: Follows one step  commands with increased time    Cueing Cueing Techniques: Verbal cues, Tactile cues  Exercises      General Comments General comments (skin integrity, edema, etc.): HR max 120      Pertinent Vitals/Pain Pain Assessment Pain Assessment: No/denies pain    Home Living                          Prior Function            PT Goals (current goals can now be found in the care plan section) Acute Rehab PT Goals Patient Stated Goal: agrees he wants to get moving Time For Goal Achievement: 10/15/23 Potential to Achieve Goals: Good Progress towards PT goals: Progressing toward goals    Frequency    Min 2X/week      PT Plan      Co-evaluation              AM-PAC PT 6 Clicks Mobility   Outcome Measure  Help needed turning from your back to your side while in a flat bed without using bedrails?: A Little Help needed moving from lying on your back to sitting on the side of a flat bed without using bedrails?: A Lot Help needed moving to and from a bed to a chair (including a wheelchair)?: Total Help needed standing up from a chair using your arms (e.g., wheelchair or bedside chair)?: A Lot Help needed to walk in hospital room?: A Little Help needed climbing 3-5 steps with a railing? : Total 6 Click Score: 12    End of Session Equipment Utilized During Treatment: Gait belt Activity Tolerance: Patient tolerated treatment well Patient left: with call bell/phone within reach;in bed;with bed alarm set;with family/visitor present Nurse Communication: Mobility status PT Visit Diagnosis: Unsteadiness on feet (R26.81);Muscle weakness (generalized) (M62.81);Difficulty in walking, not elsewhere classified (R26.2)     Time: 6644-0347 PT Time Calculation (min) (ACUTE ONLY): 29 min  Charges:    $Gait Training: 23-37 mins PT General Charges $$ ACUTE PT VISIT: 1 Visit                      Gayle Kava, PT Acute Rehabilitation Services  Office [phone]    Guilford Leep 10/05/2023, 2:10 PM

## 2023-10-05 NOTE — Progress Notes (Signed)
 Patient ID: [name], male   DOB: 16-Aug-1950, 73 y.o.   MRN: [id number] [facility] Progress Note   Assessment/ Plan:    1.  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 (BL Cr 1.3 - 1.6).  Developed nonoliguric postoperative acute kidney injury likely from ischemic ATN given difficulties of weaning from cardiopulmonary bypass.  Started on CRRT on 6/7 for management of volume status (fixed urine output on high-dose furosemide ) and azotemia with mental status improving after CRRT per charting  ------------------ - no acute indication for renal replacement therapy today  - assess dialysis needs daily  - has a nontunneled catheter.  If he continues to need dialysis will need a tunneled catheter placed early next week   2.  NSTEMI: With multivessel coronary artery disease and underwent four-vessel CABG on 09/27/2023.  Presented 09/21/23.  Postoperative course significant for difficulty with getting him off cardiopulmonary bypass and developed postoperative shock with pressor/inotrope support. - post-operative care per CT surgery   3.  Postoperative shock: Previously on norepinephrine , epinephrine  (now off)  - s/p milrinone  per CHF team  - now off  4.  Normocytic Anemia: Likely associated with surgical/postoperative blood loss.  - PRBC's per primary team  - Defer ESA for now with recent NSTEMI but anticipate starting when acceptable perhaps as early as next week if needed - CBC in AM  5. CKD stage 3a - note baseline Cr 1.3 - 1.6   Disposition - in ICU on CRRT     Subjective:   CRRT stopped on 6/11 evening per my order.  He had 1.5 liters UF over 6/11 with CRRT.  He had zero mL UOP over 6/11 documented.  He feels ok and states he had a good night   Review of systems:      Denies n/v; has abdominal distention No stool yesterday but states passing gas and several stools the day prior  charted Denies shortness of breath or chest pain   Objective:   BP 136/83   Pulse (!) 107   Temp 98.5 F (36.9 C) (Oral)   Resp 17   Ht 5' 4 (1.626 m)   Wt 92.1 kg   SpO2 93%   BMI 34.85 kg/m   Intake/Output Summary (Last 24 hours) at 10/05/2023 4098 Last data filed at 10/05/2023 0600 Gross per 24 hour  Intake 477.29 ml  Output 1619.6 ml  Net -1142.31 ml   Weight change: -0.842 kg  Physical Exam:      General adult male in bed in no acute distress HEENT normocephalic atraumatic extraocular movements intact sclera anicteric Neck supple trachea midline Lungs clear to auscultation bilaterally normal work of breathing at rest on room air; midline sternal incision  Heart S1S2 Abdomen soft nontender distended Extremities no edema  Psych normal mood and affect; no anxiety or agitation  Neuro awake and conversant; oriented to person and year  Access: left internal jugular nontunneled dialysis catheter    Imaging: No results found.    Labs: BMET Recent Labs  Lab 10/02/23 0410 10/02/23 1600 10/03/23 0500 10/03/23 1705 10/04/23 0531 10/04/23 1606 10/05/23 0400  NA 138 131* 136 134* 135 136 136  K 3.8 4.0 3.8 3.9 3.7 4.1 3.7  CL 104 100 102 99 102 100 102  CO2 27 25 24 22 24 25 25   GLUCOSE 54* 230* 77 244* 124* 138* 93  BUN 15 15 13 18 17 18  25*  CREATININE 1.90* 1.83* 1.75* 1.85* 1.90* 1.82* 2.96*  CALCIUM  8.0* 7.4* 8.1* 8.1* 8.3* 8.4* 8.2*  PHOS 2.5 2.3* 3.0 2.6 2.2* 2.9 3.2   CBC Recent Labs  Lab 10/01/23 0500 10/02/23 0410 10/03/23 0500 10/04/23 0531  WBC 9.9 9.8 12.3* 12.6*  HGB 9.2* 9.2* 10.1* 9.8*  HCT 29.4* 30.1* 31.9* 31.3*  MCV 91.6 92.0 89.6 89.7  PLT 184 163 195 223    Medications:     (feeding supplement) PROSource Plus  30 mL Oral TID WC   alteplase  2 mg Intracatheter Once   aspirin   81 mg Oral Daily   atorvastatin   80 mg Oral Daily   bisacodyl   10 mg Oral Daily   Or   bisacodyl   10 mg Rectal Daily   Chlorhexidine  Gluconate Cloth   6 each Topical Daily   clopidogrel  75 mg Oral Daily   docusate  200 mg Oral Daily   feeding supplement  1 Container Oral TID BM   heparin  injection (subcutaneous)  5,000 Units Subcutaneous Q8H   insulin  aspart  0-9 Units Subcutaneous Q4H   insulin  aspart  2 Units Subcutaneous TID WC   insulin  glargine-yfgn  10 Units Subcutaneous BID   metoCLOPramide  (REGLAN ) injection  10 mg Intravenous Q8H   multivitamin  1 tablet Oral QHS   potassium chloride   20 mEq Oral Q4H   sodium chloride  flush  3 mL Intravenous Q12H   traZODone  50 mg Oral QHS    Nan Aver, MD 10/05/2023, 6:35 AM

## 2023-10-05 NOTE — Progress Notes (Signed)
 EVENING ROUNDS NOTE :     [address]       [place] [address]             [phone]                 8 Days Post-Op Procedure(s) (LRB): CORONARY ARTERY BYPASS GRAFTING (CABG) TIMES FOUR UTILIZING THE LEFT INTERNAL MAMMARY ARTERY AND ENDOSCOPIC VEIN HARVEST RIGHT GREATER SAPHENOUS VEIN (N/A) ECHOCARDIOGRAM, TRANSESOPHAGEAL, INTRAOPERATIVE (N/A)   Total Length of Stay:  LOS: 14 days  Events:   no events stable day    BP 117/73   Pulse (!) 102   Temp 99.1 F (37.3 C) (Axillary)   Resp 19   Ht 5' 4 (1.626 m)   Wt 92.1 kg   SpO2 97%   BMI 34.85 kg/m   CVP:  [7 mmHg] 7 mmHg       I/O last 3 completed shifts: In: 713.4 [P.O.:440; I.V.:18.1; IV Piggyback:255.3] Out: 1819.6 [Urine:300]      Latest Ref Rng & Units 10/04/2023    5:31 AM 10/03/2023    5:00 AM 10/02/2023    4:10 AM  CBC  WBC 4.0 - 10.5 K/uL 12.6  12.3  9.8   Hemoglobin 13.0 - 17.0 g/dL 9.8  40.3  9.2   Hematocrit 39.0 - 52.0 % 31.3  31.9  30.1   Platelets 150 - 400 K/uL 223  195  163        Latest Ref Rng & Units 10/05/2023    4:00 AM 10/04/2023    4:06 PM 10/04/2023    5:31 AM  BMP  Glucose 70 - 99 mg/dL 93  474  259   BUN 8 - 23 mg/dL 25  18  17    Creatinine 0.61 - 1.24 mg/dL 5.63  8.75  6.43   Sodium 135 - 145 mmol/L 136  136  135   Potassium 3.5 - 5.1 mmol/L 3.7  4.1  3.7   Chloride 98 - 111 mmol/L 102  100  102   CO2 22 - 32 mmol/L 25  25  24    Calcium  8.9 - 10.3 mg/dL 8.2  8.4  8.3     ABG    Component Value Date/Time   PHART 7.336 (L) 09/28/2023 0854   PCO2ART 38.4 09/28/2023 0854   PO2ART 87 09/28/2023 0854   HCO3 20.5 09/28/2023 0854   TCO2 22 09/28/2023 0854   ACIDBASEDEF 5.0 (H) 09/28/2023 0854   O2SAT 75.6 10/05/2023 0410       Starleen Eastern, MD 10/05/2023 10:55 PM

## 2023-10-06 ENCOUNTER — Encounter (HOSPITAL_COMMUNITY): Payer: Self-pay | Admitting: Thoracic Surgery (Cardiothoracic Vascular Surgery)

## 2023-10-06 DIAGNOSIS — R579 Shock, unspecified: Secondary | ICD-10-CM | POA: Diagnosis not present

## 2023-10-06 DIAGNOSIS — I214 Non-ST elevation (NSTEMI) myocardial infarction: Secondary | ICD-10-CM | POA: Diagnosis not present

## 2023-10-06 DIAGNOSIS — N179 Acute kidney failure, unspecified: Secondary | ICD-10-CM | POA: Diagnosis not present

## 2023-10-06 DIAGNOSIS — I5043 Acute on chronic combined systolic (congestive) and diastolic (congestive) heart failure: Secondary | ICD-10-CM | POA: Diagnosis not present

## 2023-10-06 LAB — MAGNESIUM: Magnesium: 2.7 mg/dL — ABNORMAL HIGH (ref 1.7–2.4)

## 2023-10-06 LAB — CBC
HCT: 28.8 % — ABNORMAL LOW (ref 39.0–52.0)
Hemoglobin: 9 g/dL — ABNORMAL LOW (ref 13.0–17.0)
MCH: 28.2 pg (ref 26.0–34.0)
MCHC: 31.3 g/dL (ref 30.0–36.0)
MCV: 90.3 fL (ref 80.0–100.0)
Platelets: 329 10*3/uL (ref 150–400)
RBC: 3.19 MIL/uL — ABNORMAL LOW (ref 4.22–5.81)
RDW: 14.2 % (ref 11.5–15.5)
WBC: 12.9 10*3/uL — ABNORMAL HIGH (ref 4.0–10.5)
nRBC: 0.7 % — ABNORMAL HIGH (ref 0.0–0.2)

## 2023-10-06 LAB — COOXEMETRY PANEL
Carboxyhemoglobin: 1.8 % — ABNORMAL HIGH (ref 0.5–1.5)
Methemoglobin: 1.3 % (ref 0.0–1.5)
O2 Saturation: 62.8 %
Total hemoglobin: <6.9 g/dL — CL (ref 12.0–16.0)

## 2023-10-06 LAB — RENAL FUNCTION PANEL
Albumin: 2.2 g/dL — ABNORMAL LOW (ref 3.5–5.0)
Anion gap: 11 (ref 5–15)
BUN: 45 mg/dL — ABNORMAL HIGH (ref 8–23)
CO2: 25 mmol/L (ref 22–32)
Calcium: 8.5 mg/dL — ABNORMAL LOW (ref 8.9–10.3)
Chloride: 103 mmol/L (ref 98–111)
Creatinine, Ser: 3.56 mg/dL — ABNORMAL HIGH (ref 0.61–1.24)
GFR, Estimated: 17 mL/min — ABNORMAL LOW (ref >60)
Glucose, Bld: 83 mg/dL (ref 70–99)
Phosphorus: 4.3 mg/dL (ref 2.5–4.6)
Potassium: 3.9 mmol/L (ref 3.5–5.1)
Sodium: 139 mmol/L (ref 135–145)

## 2023-10-06 LAB — GLUCOSE, CAPILLARY
Glucose-Capillary: 82 mg/dL (ref 70–99)
Glucose-Capillary: 167 mg/dL — ABNORMAL HIGH (ref 70–99)
Glucose-Capillary: 158 mg/dL — ABNORMAL HIGH (ref 70–99)
Glucose-Capillary: 260 mg/dL — ABNORMAL HIGH (ref 70–99)

## 2023-10-06 MED ORDER — HEPARIN SODIUM (PORCINE) 1000 UNIT/ML IJ SOLN
INTRAMUSCULAR | Status: AC
  Filled 2023-10-06: qty 3

## 2023-10-06 MED ORDER — CARVEDILOL 3.125 MG PO TABS
3.1250 mg | ORAL_TABLET | Freq: Two times a day (BID) | ORAL | Status: DC
  Administered 2023-10-06 – 2023-10-13 (×15): 3.125 mg via ORAL
  Filled 2023-10-06 (×15): qty 1

## 2023-10-06 MED ORDER — ALBUMIN HUMAN 25 % IV SOLN: 25.0000 g | Freq: Once | INTRAVENOUS | Status: DC

## 2023-10-06 MED ORDER — LIDOCAINE-PRILOCAINE 2.5-2.5 % EX CREA: 1.0000 | TOPICAL_CREAM | CUTANEOUS | Status: DC | PRN

## 2023-10-06 MED ORDER — ALTEPLASE 2 MG IJ SOLR: 2.0000 mg | Freq: Once | INTRAMUSCULAR | Status: DC | PRN

## 2023-10-06 MED ORDER — HEPARIN SODIUM (PORCINE) 1000 UNIT/ML IJ SOLN
2800.0000 [IU] | Freq: Once | INTRAMUSCULAR | Status: AC
  Administered 2023-10-06: 2800 [IU]

## 2023-10-06 MED ORDER — DOCUSATE SODIUM 100 MG PO CAPS
200.0000 mg | ORAL_CAPSULE | Freq: Every day | ORAL | Status: DC
  Administered 2023-10-06 – 2023-10-13 (×5): 200 mg via ORAL
  Filled 2023-10-06 (×5): qty 2

## 2023-10-06 MED ORDER — ANTICOAGULANT SODIUM CITRATE 4% (200MG/5ML) IV SOLN: 5.0000 mL | Status: DC | PRN

## 2023-10-06 MED ORDER — LIDOCAINE HCL (PF) 1 % IJ SOLN: 5.0000 mL | INTRAMUSCULAR | Status: DC | PRN

## 2023-10-06 MED ORDER — PENTAFLUOROPROP-TETRAFLUOROETH EX AERO: 1.0000 | INHALATION_SPRAY | CUTANEOUS | Status: DC | PRN

## 2023-10-06 MED ORDER — HEPARIN SODIUM (PORCINE) 1000 UNIT/ML DIALYSIS: 1000.0000 [IU] | INTRAMUSCULAR | Status: DC | PRN

## 2023-10-06 MED ORDER — INSULIN GLARGINE-YFGN 100 UNIT/ML ~~LOC~~ SOLN
5.0000 [IU] | Freq: Every day | SUBCUTANEOUS | Status: DC
  Administered 2023-10-06 – 2023-10-07 (×2): 5 [IU] via SUBCUTANEOUS
  Filled 2023-10-06 (×2): qty 0.05

## 2023-10-06 MED ORDER — INSULIN GLARGINE-YFGN 100 UNIT/ML ~~LOC~~ SOLN
5.0000 [IU] | Freq: Two times a day (BID) | SUBCUTANEOUS | Status: DC
  Filled 2023-10-06 (×2): qty 0.05

## 2023-10-06 NOTE — Progress Notes (Signed)
 Patient ID: [name], male   DOB: Nov 10, 1950, 72 y.o.   MRN: [id number]  Hemodynamically stable in sinus rhythm.  Sats 99% RA  Undergoing HD today.  Bowels working.

## 2023-10-06 NOTE — Progress Notes (Signed)
 Received patient in bed to unit.  Alert and oriented.  Informed consent signed and in chart.   TX duration:3.5 hours  Patient tolerated well.  Transported back to the room  Alert, without acute distress.  Hand-off given to patient's nurse.   Access used: Left internal jugular HD Cath, dressing changed Access issues: none  Total UF removed: 1L Medication(s) given: none   10/06/23 1758  Vitals  Temp 97.9 F (36.6 C)  Temp Source Oral  BP 138/74  MAP (mmHg) 93  Pulse Rate (!) 107  ECG Heart Rate (!) 108  Resp 16  Oxygen Therapy  SpO2 98 %  O2 Device Room Air  During Treatment Monitoring  HD Safety Checks Performed Yes  Intra-Hemodialysis Comments Tx completed  Dialysis Fluid Bolus Normal Saline  Bolus Amount (mL) 300 mL  Post Treatment  Dialyzer Clearance Clear  Liters Processed 84  Fluid Removed (mL) 1000 mL  Tolerated HD Treatment Yes  Hemodialysis Catheter Left Internal jugular Triple lumen Temporary (Non-Tunneled)  Placement Date/Time: 09/30/23 1200   Time Out: Correct patient;Correct site;Correct procedure  Maximum sterile barrier precautions: Hand hygiene;Sterile gloves;Cap;Large sterile sheet;Mask;Sterile probe cover;Sterile gown  Site Prep: Chlorhexidine  (pr...  Site Condition No complications  Blue Lumen Status Dead end cap in place;Saline locked;Flushed  Red Lumen Status Dead end cap in place;Saline locked;Flushed  Purple Lumen Status N/A  Catheter fill solution Heparin  1000 units/ml  Catheter fill volume (Arterial) 1.4 cc  Catheter fill volume (Venous) 1.4  Dressing Type Transparent  Dressing Status Antimicrobial disc/dressing in place;Clean, Dry, Intact  Interventions Other (Comment) (deaccessed)  Drainage Description None  Dressing Change Due 10/13/23  Post treatment catheter status Capped and Clamped     Luciano Ruths LPN [facility]

## 2023-10-06 NOTE — Progress Notes (Signed)
 Patient ID: [name], male   DOB: [birth year], 73 y.o.   MRN: [id number] [facility] Progress Note   Assessment/ Plan:    1.  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 (BL Cr 1.3 - 1.6).  Developed nonoliguric postoperative acute kidney injury likely from ischemic ATN given difficulties of weaning from cardiopulmonary bypass.  Started on CRRT on 6/7 for management of volume status (fixed urine output on high-dose furosemide ) and azotemia with mental status improving after CRRT per charting  ------------------ - Plan for HD today.  Assess dialysis needs daily  - Await AM labs - has a nontunneled catheter.  If he continues to need dialysis will need a tunneled catheter placed early next week   2.  NSTEMI: With multivessel coronary artery disease and underwent four-vessel CABG on 09/27/2023.  Presented 09/21/23.  Postoperative course significant for difficulty with getting him off cardiopulmonary bypass and developed postoperative shock with pressor/inotrope support. - post-operative care per CT surgery   3.  Postoperative shock: Previously on norepinephrine , epinephrine  (now off)  - s/p milrinone  per CHF team  - now off  4.  Normocytic Anemia: Likely associated with surgical/postoperative blood loss.  - PRBC's per primary team  - Defer ESA for now with recent NSTEMI but anticipate starting when acceptable perhaps as early as next week if needed - trend labs  5. CKD stage 3a - note baseline Cr 1.3 - 1.6   Disposition - in ICU on CRRT     Subjective:   He had 1.5 liters UF over 6/11 with CRRT.  He had 300 mL UOP over 6/12.  Labs from today haven't been drawn - I asked that they reach out to lab to make sure these are done.    Review of systems:      Denies n/v; has abdominal distention but better States no stool yesterday but states passing gas  Denies shortness of breath or chest pain Has  to urinate   Objective:   BP 117/73   Pulse (!) 102   Temp 98 F (36.7 C) (Axillary)   Resp 19   Ht 5' 4 (1.626 m)   Wt 92.1 kg   SpO2 97%   BMI 34.85 kg/m   Intake/Output Summary (Last 24 hours) at 10/06/2023 0555 Last data filed at 10/05/2023 1300 Gross per 24 hour  Intake 240 ml  Output 300 ml  Net -60 ml   Weight change:   Physical Exam:       General adult male in bed in no acute distress HEENT normocephalic atraumatic extraocular movements intact sclera anicteric Neck supple trachea midline Lungs clear to auscultation bilaterally normal work of breathing at rest on room air; midline sternal incision  Heart S1S2 Abdomen soft nontender distended Extremities no edema  Psych normal mood and affect; no anxiety or agitation  Neuro awake and conversant; oriented to person and year and location  Access: left internal jugular nontunneled dialysis catheter    Imaging: No results found.    Labs: BMET Recent Labs  Lab 10/02/23 0410 10/02/23 1600 10/03/23 0500 10/03/23 1705 10/04/23 0531 10/04/23 1606 10/05/23 0400  NA 138 131* 136 134* 135 136 136  K 3.8 4.0 3.8 3.9 3.7 4.1 3.7  CL 104 100 102 99 102 100 102  CO2 27 25 24 22 24 25 25   GLUCOSE 54* 230* 77 244* 124* 138* 93  BUN 15 15 13 18  17  18 25*  CREATININE 1.90* 1.83* 1.75* 1.85* 1.90* 1.82* 2.96*  CALCIUM  8.0* 7.4* 8.1* 8.1* 8.3* 8.4* 8.2*  PHOS 2.5 2.3* 3.0 2.6 2.2* 2.9 3.2   CBC Recent Labs  Lab 10/01/23 0500 10/02/23 0410 10/03/23 0500 10/04/23 0531  WBC 9.9 9.8 12.3* 12.6*  HGB 9.2* 9.2* 10.1* 9.8*  HCT 29.4* 30.1* 31.9* 31.3*  MCV 91.6 92.0 89.6 89.7  PLT 184 163 195 223    Medications:     (feeding supplement) PROSource Plus  30 mL Oral TID WC   alteplase   2 mg Intracatheter Once   aspirin   81 mg Oral Daily   atorvastatin   80 mg Oral Daily   bisacodyl   10 mg Oral Daily   Or   bisacodyl   10 mg Rectal Daily   Chlorhexidine  Gluconate Cloth  6 each Topical Daily   Chlorhexidine   Gluconate Cloth  6 each Topical Q0600   clopidogrel   75 mg Oral Daily   docusate  200 mg Oral Daily   feeding supplement  1 Container Oral TID BM   heparin  injection (subcutaneous)  5,000 Units Subcutaneous Q8H   insulin  aspart  0-9 Units Subcutaneous TID WC   insulin  aspart  2 Units Subcutaneous TID WC   insulin  glargine-yfgn  7 Units Subcutaneous BID   multivitamin  1 tablet Oral QHS   sodium chloride  flush  3 mL Intravenous Q12H   traZODone   50 mg Oral QHS    Nan Aver, MD 10/06/2023, 6:06 AM

## 2023-10-06 NOTE — Progress Notes (Addendum)
 Patient ID: [name], male   DOB: 25-Aug-1950, 73 y.o.   MRN: [id number]     Advanced Heart Failure Rounding Note  Cardiologist: Hazle Lites, MD  Chief Complaint: Post cardiotomy shock Patient Profile   73 y.o. male with history of CAD, HFmrEF, uncontrolled DM II, HTN, HLD. Admitted with NSTEMI and acute on chronic CHF. Underwent CABG X 4 on 09/27/23 c/b postcardiotomy shock.  Subjective:    06/04: CABG X 4 with Dr. [name]. Difficulty separating from cardiopulmonary bypass 06/05: Extubated   POD #9   Co-ox stable off milrinone , 63%  Off CRRT.  UOP picking up but still oliguric. Scheduled for iHD today   Feeling well this morning. In good spirit. Ambulated w/ PT earlier and did well. Denies significant dyspnea. Family present at bedside.    Objective:    Weight Range: 93.4 kg Body mass index is 35.34 kg/m.   Vital Signs:   Temp:  [97.4 F (36.3 C)-99.1 F (37.3 C)] 97.4 F (36.3 C) (06/13 1100) Pulse Rate:  [92-113] 98 (06/13 1100) Resp:  [15-24] 21 (06/13 1100) BP: (99-148)/(57-82) 101/71 (06/13 1100) SpO2:  [94 %-100 %] 99 % (06/13 1100) Weight:  [93.4 kg] 93.4 kg (06/13 0500) Last BM Date : 10/03/23  Weight change: Filed Weights   10/04/23 0500 10/05/23 0500 10/06/23 0500  Weight: 92.9 kg 92.1 kg 93.4 kg   Intake/Output:  Intake/Output Summary (Last 24 hours) at 10/06/2023 1157 Last data filed at 10/06/2023 0800 Gross per 24 hour  Intake 600 ml  Output 625 ml  Net -25 ml    Physical Exam   General:  Well appearing, sitting up in chair. No respiratory difficulty HEENT: normal Neck: supple. JVD not elevated. + left internal jugular HD cath  Lungs: clear Abdomen: soft, nontender, nondistended. No hepatosplenomegaly. No bruits or masses. Good bowel sounds. Extremities: no cyanosis, clubbing, rash, edema Neuro: alert & oriented x 3, cranial nerves grossly intact. moves all 4 extremities w/o difficulty. Affect pleasant.   Telemetry: sinus tach 103  bpm (personally reviewed)  Labs    CBC Recent Labs    10/04/23 0531 10/06/23 0533  WBC 12.6* 12.9*  HGB 9.8* 9.0*  HCT 31.3* 28.8*  MCV 89.7 90.3  PLT 223 329   Basic Metabolic Panel Recent Labs    69/62/95 0400 10/06/23 0533  NA 136 139  K 3.7 3.9  CL 102 103  CO2 25 25  GLUCOSE 93 83  BUN 25* 45*  CREATININE 2.96* 3.56*  CALCIUM  8.2* 8.5*  MG 2.8* 2.7*  PHOS 3.2 4.3   Liver Function Tests Recent Labs    10/05/23 0400 10/06/23 0533  ALBUMIN  2.4* 2.2*   BNP (last 3 results) Recent Labs    09/21/23 0806  BNP 128.5*   Medications:    Scheduled Medications:  (feeding supplement) PROSource Plus  30 mL Oral TID WC   alteplase   2 mg Intracatheter Once   aspirin   81 mg Oral Daily   atorvastatin   80 mg Oral Daily   bisacodyl   10 mg Oral Daily   Or   bisacodyl   10 mg Rectal Daily   carvedilol   3.125 mg Oral BID WC   Chlorhexidine  Gluconate Cloth  6 each Topical Daily   Chlorhexidine  Gluconate Cloth  6 each Topical Q0600   clopidogrel   75 mg Oral Daily   docusate sodium   200 mg Oral Daily   feeding supplement  1 Container Oral TID BM   heparin  injection (subcutaneous)  5,000 Units  Subcutaneous Q8H   insulin  aspart  0-9 Units Subcutaneous TID WC   insulin  glargine-yfgn  5 Units Subcutaneous Daily   multivitamin  1 tablet Oral QHS   traZODone   50 mg Oral QHS   Infusions:  anticoagulant sodium citrate      PRN Medications: alteplase , anticoagulant sodium citrate, dextrose , heparin , levalbuterol , lidocaine  (PF), lidocaine -prilocaine, ondansetron  (ZOFRAN ) IV, mouth rinse, oxyCODONE , pentafluoroprop-tetrafluoroeth, phenol, traMADol   Assessment/Plan   1. Acute systolic CHF/Cardiogenic shock: Ischemic cardiomyopathy. Echo pre-CABG with EF 35-40%, RV hard to visualize. Difficulty separating from cardiopulmonary bypass following CABG 09/27/23. Placed on low dose milrinone  0.125 for RV support, now off.  - Echo 09/30/23 EF 25-30%  - co-ox stable off milrinone  -  now off CRRT. UOP picking up but still oliguric. Nephrology planning iHD today   2. CAD: NSTEMI. Hx PCI to LAD in 2005.  NSTEMI 09/21/23 with multivessel disease on LHC. S/p CABG X 4 (LIMA to LAD, SVG to OM1, sequential to OM3, SVG to PDA) on 09/27/23 - stable w/o CP  - Continue ASA + plavix  75 mg daily - GDMT limited by renal fx  - Continue atorva 80. LDL 121 (not adherent with home meds prior to admit)  3. Acute Renal Failure: CKD 3 at baseline. Admission creatinine was 1.9. Creatinine peaked above 4, worsening uremic symptoms. Started on CRRT for clearance and volume removal. - Now off CRRT. Hope for renal recovery. Remains oliguric, though picking up some.  - plan iHD today per nephrology   4. Anemia: Post-op, hgb stable.  5. DM II: Uncontrolled, A1c 11.3% - Insulin  per TCTS  Length of Stay: [address], PA-C 10/06/2023, 11:57 AM  Advanced Heart Failure Team Pager [phone] (M-F; 7a - 5p)   Patient seen and examined with the above-signed Advanced Practice Provider and/or Housestaff. I personally reviewed laboratory data, imaging studies and relevant notes. I independently examined the patient and formulated the important aspects of the plan. I have edited the note to reflect any of my changes or salient points. I have personally discussed the plan with the patient and/or family.  Feeling good today. Denies CP or SOB. Of inotropes. Co-ox 63%. Was able to ambulate halls yesterday. Starting to make some urine but [place] back up to 3.6  General:  Sitting in chair  No resp difficulty HEENT: normal Neck: supple. JVP 10 Cor: Reg tachy  Lungs: clear Abdomen: soft, nontender, nondistended. No hepatosplenomegaly. No bruits or masses. Good bowel sounds. Extremities: no cyanosis, clubbing, rash, edema Neuro: alert & orientedx3, cranial nerves grossly intact. moves all 4 extremities w/o difficulty. Affect pleasant  Continues to improve. Co-ox stable of inotorpes. TCTS adding low-dose  b-blocker.   Urine output picking up but still insufficient. Will get iHD today  Titrate GDMT as able.   Jules Oar, MD  12:34 PM

## 2023-10-06 NOTE — Progress Notes (Signed)
 NAME:  [name], MRN:  [id number], DOB:  11/20/1950, LOS: 15 ADMISSION DATE:  09/21/2023, CONSULTATION DATE: 09/28/2023 REFERRING MD: Dr. [name], CHIEF COMPLAINT: Status post CABG x 4  History of Present Illness:  73 year old male with coronary artery disease, diabetes type 2, hypertension, CKD stage IIIa, hyperlipidemia and chronic HFrEF who initially presented about a week ago with epigastric pain radiating to the chest, was admitted with acute NSTEMI, he underwent cardiac cath which showed severe multivessel coronary artery disease with elevated LVEDP.  Patient underwent CABG x 4, he had decreased urine output post CABG, he was not extubated per rapid weaning protocol, remain on vasopressor support with Levophed , epinephrine  and milrinone .  PCCM was consulted for help evaluation medical management   Pertinent  Medical History   Past Medical History:  Diagnosis Date   CAD (coronary artery disease)    Diabetes mellitus without complication (HCC)    Hypercholesterolemia    Hypertension    MI (myocardial infarction) (HCC)    Renal insufficiency     Significant Hospital Events: Including procedures, antibiotic start and stop dates in addition to other pertinent events   5/29 admitted with acute NSTEMI 6/4 CABG x 4 6/5 PCCM consulted 6/7 start CRRT, bowel movements after enemas. Minimal NGT output.  6/8 foley removed, able to start mobility, started clear liquids 6/10 d/c RIJ introducer, con't milrinone . 6/11 On CRRT yesterday, worked with PT.  Afebrile. He denies complaints today. 6/12 no issues overnight remains off all continuous infusions and off CRRT  Interim History / Subjective:  Patient remained off CRRT Made 450 cc of urine in last 24 hours Denies any complaint  Objective    Blood pressure 106/64, pulse (!) 101, temperature 97.9 F (36.6 C), temperature source Oral, resp. rate 18, height 5' 4 (1.626 m), weight 93.4 kg, SpO2 97%.        Intake/Output Summary  (Last 24 hours) at 10/06/2023 4098 Last data filed at 10/06/2023 0630 Gross per 24 hour  Intake 240 ml  Output 625 ml  Net -385 ml   Filed Weights   10/04/23 0500 10/05/23 0500 10/06/23 0500  Weight: 92.9 kg 92.1 kg 93.4 kg    Examination: General: Chronically ill-appearing elderly male, lying on the bed HEENT: [place]/AT, eyes anicteric.  moist mucus membranes Neuro: Alert, awake following commands Chest: Central sternotomy incision looks clean and dry coarse breath sounds, no wheezes or rhonchi Heart: Tachycardic, regular rhythm, no murmurs or gallops Abdomen: Soft, nontender, nondistended, bowel sounds present   Labs and images reviewed  Patient Lines/Drains/Airways Status     Active Line/Drains/Airways     Name Placement date Placement time Site Days   Peripheral IV 09/26/23 22 G 1.75 Left;Posterior Forearm 09/26/23  1624  Forearm  10   Hemodialysis Catheter Left Internal jugular Triple lumen Temporary (Non-Tunneled) 09/30/23  1200  Internal jugular  6   Incision (Closed) 09/27/23 Chest Other (Comment) 09/27/23  1027  -- 9   Incision (Closed) 09/27/23 Leg Right 09/27/23  1027  -- 9   Wound / Incision (Open or Dehisced) 09/30/23 (MARSI) Medical Adhesive-Related Skin Injury Chest Upper;Left small, irregular skin tear due to island dressing removal 09/30/23  0800  Chest  6         Resolved problem list  Acute respiratory failure with hypoxia  Assessment and Plan  Multivessel coronary artery disease, presented with acute NSTEMI s/p CABG x 4 Continue aspirin , Plavix  and statin Continue pain control with tramadol , oxycodone  and morphine   Acute on  chronic HFrEF with cardiogenic shock Patient is off vasopressors and inotropic support Ejection fraction is 25 to 30% Shock has resolved Started on Coreg  low-dose this morning GDMT as tolerated Advanced heart failure team is following  AKI on CKD stage IIIa due to ischemic ATN, required CRRT Patient remain off CRRT He is  scheduled for intermittent dialysis this morning He made 450 cc of urine in last 24 hours, that is a good sign for renal recovery  Postop ileus, resolved Patient is having bowel movement Stated appetite is coming back  Hypertension On low-dose Coreg   Hyperlipidemia Continue atorvastatin   Poorly controlled diabetes type 2 with hyperglycemia Patient hemoglobin A1c is 11.3 Blood sugars are better controlled Decrease Lantus  to 5 units daily Stop meal coverage insulin  Continue sliding scale insulin  with CBG goal 140-180  Expected perioperative blood loss anemia Monitor H/H and PLT counts  Obesity Diet and exercise counseling provided   Best Practice (right click and Reselect all SmartList Selections daily)   Diet/type: Regular consistency DVT prophylaxis: Subcu heparin  GI prophylaxis: N/A Lines: HD catheter, still needed Foley:  Yes, and it is still needed Code Status:  full code Last date of multidisciplinary goals of care discussion [Per primary team]   Labs   CBC: Recent Labs  Lab 10/01/23 0500 10/02/23 0410 10/03/23 0500 10/04/23 0531 10/06/23 0533  WBC 9.9 9.8 12.3* 12.6* 12.9*  HGB 9.2* 9.2* 10.1* 9.8* 9.0*  HCT 29.4* 30.1* 31.9* 31.3* 28.8*  MCV 91.6 92.0 89.6 89.7 90.3  PLT 184 163 195 223 329    Basic Metabolic Panel: Recent Labs  Lab 10/02/23 0410 10/02/23 1600 10/03/23 0500 10/03/23 1705 10/04/23 0531 10/04/23 1606 10/05/23 0400 10/06/23 0533  NA 138   < > 136 134* 135 136 136 139  K 3.8   < > 3.8 3.9 3.7 4.1 3.7 3.9  CL 104   < > 102 99 102 100 102 103  CO2 27   < > 24 22 24 25 25 25   GLUCOSE 54*   < > 77 244* 124* 138* 93 83  BUN 15   < > 13 18 17 18  25* 45*  CREATININE 1.90*   < > 1.75* 1.85* 1.90* 1.82* 2.96* 3.56*  CALCIUM  8.0*   < > 8.1* 8.1* 8.3* 8.4* 8.2* 8.5*  MG 2.8*  --  2.6*  --  2.6*  --  2.8* 2.7*  PHOS 2.5   < > 3.0 2.6 2.2* 2.9 3.2 4.3   < > = values in this interval not displayed.   GFR: Estimated Creatinine  Clearance: 19.1 mL/min (A) (by C-G formula based on SCr of 3.56 mg/dL (H)). Recent Labs  Lab 10/02/23 0410 10/03/23 0500 10/04/23 0531 10/06/23 0533  WBC 9.8 12.3* 12.6* 12.9*    Liver Function Tests: Recent Labs  Lab 09/30/23 0516 09/30/23 1600 10/03/23 1705 10/04/23 0531 10/04/23 1606 10/05/23 0400 10/06/23 0533  AST 31  --   --   --   --   --   --   ALT 5  --   --   --   --   --   --   ALKPHOS 55  --   --   --   --   --   --   BILITOT 0.6  --   --   --   --   --   --   PROT 6.1*  --   --   --   --   --   --  ALBUMIN  2.6*   < > 2.6* 2.5* 2.6* 2.4* 2.2*   < > = values in this interval not displayed.   No results for input(s): LIPASE, AMYLASE in the last 168 hours. No results for input(s): AMMONIA in the last 168 hours.  ABG    Component Value Date/Time   PHART 7.336 (L) 09/28/2023 0854   PCO2ART 38.4 09/28/2023 0854   PO2ART 87 09/28/2023 0854   HCO3 20.5 09/28/2023 0854   TCO2 22 09/28/2023 0854   ACIDBASEDEF 5.0 (H) 09/28/2023 0854   O2SAT 62.8 10/06/2023 0533     Coagulation Profile: No results for input(s): INR, PROTIME in the last 168 hours.   Cardiac Enzymes: No results for input(s): CKTOTAL, CKMB, CKMBINDEX, TROPONINI in the last 168 hours.  HbA1C: HbA1c, POC (controlled diabetic range)  Date/Time Value Ref Range Status  03/01/2023 10:46 AM 12.9 (A) 0.0 - 7.0 % Final  08/31/2022 10:03 AM 13.1 (A) 0.0 - 7.0 % Final   HbA1c POC (<> result, manual entry)  Date/Time Value Ref Range Status  07/24/2023 11:30 AM >15.0 4.0 - 5.6 % Final   Hgb A1c MFr Bld  Date/Time Value Ref Range Status  09/22/2023 11:30 AM 11.3 (H) 4.8 - 5.6 % Final    Comment:    (NOTE) Diagnosis of Diabetes The following HbA1c ranges recommended by the American Diabetes Association (ADA) may be used as an aid in the diagnosis of diabetes mellitus.  Hemoglobin             Suggested A1C NGSP%              Diagnosis  <5.7                   Non  Diabetic  5.7-6.4                Pre-Diabetic  >6.4                   Diabetic  <7.0                   Glycemic control for                       adults with diabetes.      CBG: Recent Labs  Lab 10/05/23 0822 10/05/23 1210 10/05/23 1629 10/05/23 2139 10/06/23 0630  GLUCAP 88 116* 140* 116* 82     Trevor Fudge, MD [facility] See Amion for pager If no response to pager, please call [phone] until 7pm After 7pm, Please call E-link [phone]

## 2023-10-06 NOTE — Progress Notes (Signed)
 Inpatient Rehab Admissions Coordinator:   Plan to try iHD again today.  Will follow up on Monday to see how he's doing.   Loye Rumble, PT, DPT Admissions Coordinator [phone] 10/06/23  12:27 PM

## 2023-10-06 NOTE — Progress Notes (Signed)
 Physical Therapy Treatment Patient Details Name: [name] MRN: [id number] DOB: [birth year] Today's Date: 10/06/2023   History of Present Illness 73 y.o. male presenting 09/21/23 with chest pain and elevated troponins. NSTEMI; 5/29 L heart cath with multivessel CAD, 6/04 CABGx4; 6/5 extubated; post-CABG cardioplegia requiring inotropic support; decr renal function with uremic encephalopathy, CRRT started 6/7 PMH: DM, MI, HTN   PT Comments  Pt in bed upon arrival and eager for mobility. Pt continues to need cues to adhere to sternal precautions with step by step instructions for adherence. ModA needed for bed mobility with ModA to stand from lower EOB height. Cues needed to use momentum and to keep hands on knees. Initial posterior lean upon standing with improvement within a few seconds. Pt was able to recognize that he was leaning posteriorly which is an improvement compared to prior sessions. Pt also increased gait distance to 165ft with EVA walker before needing a seated rest break. MinAx2 for chair follow and management of AD due to pt drifting right/left. Once back in the room, trialed RW with pt maintaining stability. Pt continues to make steady progress towards acute PT goals. Continue to recommend >3hrs post acute rehab to maximize rehab potential. Acute PT to follow.    HR 110-120 BPM SpO2 >94% on RA BP 148/76   If plan is discharge home, recommend the following: A lot of help with walking and/or transfers;A lot of help with bathing/dressing/bathroom;Assistance with cooking/housework;Assist for transportation;Help with stairs or ramp for entrance;Supervision due to cognitive status;Direct supervision/assist for financial management;Direct supervision/assist for medications management   Can travel by private vehicle      No  Equipment Recommendations  Rolling walker (2 wheels)    Recommendations for Other Services Rehab consult     Precautions / Restrictions Precautions Precautions:  Fall;Sternal Precaution Booklet Issued: No Recall of Precautions/Restrictions: Impaired Precaution/Restrictions Comments: unable to state sternal precautions, reeducated throughout session Restrictions Weight Bearing Restrictions Per Provider Order: No     Mobility  Bed Mobility Overal bed mobility: Needs Assistance Bed Mobility: Rolling, Sidelying to Sit Rolling: Mod assist Sidelying to sit: Mod assist    General bed mobility comments: cues for technique, assist to roll and raise trunk, assist for hips to EOB    Transfers Overall transfer level: Needs assistance Equipment used:  Marlea Silvers walker) Transfers: Sit to/from Stand Sit to Stand: Min assist, +2 safety/equipment, Mod assist      General transfer comment: cues for hands on knees and use of momentum, mod assist to rise and steady from bed, min from chair, mild posterior bias    Ambulation/Gait Ambulation/Gait assistance: Min assist, +2 safety/equipment Gait Distance (Feet): 140 Feet (x2) Assistive device: Blanca Bunch, Rolling walker (2 wheels) Gait Pattern/deviations: Step-through pattern, Decreased stride length, Shuffle, Drifts right/left Gait velocity: decr     General Gait Details: Tends to drift right/left when externally distracted. Poor foot clearance with shortened steps. Cues to increase gait speed. MinA for cues and management of EVA walker. Progressed to RW in the room at end of session    Balance Overall balance assessment: Needs assistance Sitting-balance support: No upper extremity supported, Feet supported Sitting balance-Leahy Scale: Fair     Standing balance support: During functional activity, Bilateral upper extremity supported, Reliant on assistive device for balance Standing balance-Leahy Scale: Poor Standing balance comment: improved posterior lean, stabilizes trunk on sink during grooming        Communication Communication Communication: Impaired Factors Affecting Communication: Hearing  impaired  Cognition Arousal: Alert Behavior  During Therapy: WFL for tasks assessed/performed   PT - Cognitive impairments: Attention, Initiation, Sequencing, Memory, Problem solving, Safety/Judgement    PT - Cognition Comments: Cues to adhere to sternal precautions throughout session. Tends to get distracted by external stimuli requiring cues to adhere to task Following commands: Intact Following commands impaired: Follows one step commands with increased time    Cueing Cueing Techniques: Verbal cues         Pertinent Vitals/Pain Pain Assessment Pain Assessment: No/denies pain     PT Goals (current goals can now be found in the care plan section) Acute Rehab PT Goals PT Goal Formulation: With patient Time For Goal Achievement: 10/15/23 Potential to Achieve Goals: Good Progress towards PT goals: Progressing toward goals    Frequency    Min 2X/week       AM-PAC PT 6 Clicks Mobility   Outcome Measure  Help needed turning from your back to your side while in a flat bed without using bedrails?: A Lot Help needed moving from lying on your back to sitting on the side of a flat bed without using bedrails?: A Lot Help needed moving to and from a bed to a chair (including a wheelchair)?: A Lot Help needed standing up from a chair using your arms (e.g., wheelchair or bedside chair)?: A Lot Help needed to walk in hospital room?: A Little Help needed climbing 3-5 steps with a railing? : Total 6 Click Score: 12    End of Session Equipment Utilized During Treatment: Gait belt Activity Tolerance: Patient tolerated treatment well Patient left: in chair;with call bell/phone within reach;with nursing/sitter in room;with family/visitor present Nurse Communication: Mobility status PT Visit Diagnosis: Unsteadiness on feet (R26.81);Muscle weakness (generalized) (M62.81);Difficulty in walking, not elsewhere classified (R26.2)     Time: 1191-4782 PT Time Calculation (min) (ACUTE  ONLY): 31 min  Charges:    $Gait Training: 8-22 mins PT General Charges $$ ACUTE PT VISIT: 1 Visit                     Orysia Blas, PT, DPT Secure Chat Preferred  Rehab Office [phone]    Alissa April Adela Ades 10/06/2023, 10:52 AM

## 2023-10-06 NOTE — Progress Notes (Addendum)
 Occupational Therapy Treatment Patient Details Name: [name] MRN: [id number] DOB: [birth year] Today's Date: 10/06/2023   History of present illness 73 y.o. male presenting 09/21/23 with chest pain and elevated troponins. NSTEMI; 5/29 L heart cath with multivessel CAD, 6/04 CABGx4; 6/5 extubated; post-CABG cardioplegia requiring inotropic support; decr renal function with uremic encephalopathy, CRRT started 6/7 PMH: DM, MI, HTN   OT comments  Pt eager to walk. Moderate assistance for bed mobility. Mod assist to stand from bed, min from recliner. Pt with less posterior bias with pt verbalizing awareness. Ambulates with min assist with eva walker and chair follow in hall and with RW short distance in room. Pt continues to be dependent in LB dressing and requires moderate assistance to don front opening gown. Pt able to stand at sink with CGA for one grooming activity, stabilizing trunk on sink. Continues to have difficulty stating and generalizing sternal precautions. Decreased attention with ambulation, decreasing speed. VSS on RA. Patient will benefit from intensive inpatient follow-up therapy, >3 hours/day.      If plan is discharge home, recommend the following:  A lot of help with bathing/dressing/bathroom;Assistance with cooking/housework;Assistance with feeding;Direct supervision/assist for medications management;Direct supervision/assist for financial management;Assist for transportation;Help with stairs or ramp for entrance;A little help with walking and/or transfers   Equipment Recommendations  BSC/3in1    Recommendations for Other Services      Precautions / Restrictions Precautions Precautions: Fall;Sternal Precaution Booklet Issued: No Recall of Precautions/Restrictions: Impaired Precaution/Restrictions Comments: unable to state sternal precautions, reeducated throughout session Restrictions Weight Bearing Restrictions Per Provider Order: No       Mobility Bed  Mobility Overal bed mobility: Needs Assistance Bed Mobility: Rolling, Sidelying to Sit Rolling: Mod assist Sidelying to sit: Mod assist       General bed mobility comments: cues for technique, assist to roll and raise trunk, assist for hips to EOB    Transfers Overall transfer level: Needs assistance Equipment used:  [name] walker) Transfers: Sit to/from Stand Sit to Stand: Min assist, +2 safety/equipment, Mod assist           General transfer comment: cues for hands on knees and use of momentum, mod assist to rise and steady from bed, min from chair, mild posterior bias     Balance Overall balance assessment: Needs assistance   Sitting balance-Leahy Scale: Fair Sitting balance - Comments: no LOB with minimal challenge at EOB   Standing balance support: During functional activity, Bilateral upper extremity supported, Reliant on assistive device for balance Standing balance-Leahy Scale: Poor Standing balance comment: improved posterior lean, stabilizes trunk on sink during grooming                           ADL either performed or assessed with clinical judgement   ADL Overall ADL's : Needs assistance/impaired     Grooming: Wash/dry hands;Standing;Contact guard assist Grooming Details (indicate cue type and reason): leans trunk on sink         Upper Body Dressing : Moderate assistance;Sitting   Lower Body Dressing: Total assistance;Bed level               Functional mobility during ADLs: Minimal assistance;Rollator (4 wheels);Rolling walker (2 wheels)      Extremity/Trunk Assessment              Vision       Perception     Praxis     Communication Communication Factors Affecting Communication: Hearing impaired  Cognition Arousal: Alert Behavior During Therapy: WFL for tasks assessed/performed Cognition: Cognition impaired     Awareness: Online awareness impaired, Intellectual awareness intact Memory impairment (select all  impairments): Short-term memory Attention impairment (select first level of impairment): Sustained attention Executive functioning impairment (select all impairments): Sequencing, Initiation, Problem solving                   Following commands: Intact Following commands impaired: Follows one step commands with increased time      Cueing   Cueing Techniques: Verbal cues  Exercises      Shoulder Instructions       General Comments      Pertinent Vitals/ Pain       Pain Assessment Pain Assessment: Faces Faces Pain Scale: No hurt  Home Living                                          Prior Functioning/Environment              Frequency  Min 2X/week        Progress Toward Goals  OT Goals(current goals can now be found in the care plan section)  Progress towards OT goals: Progressing toward goals  Acute Rehab OT Goals OT Goal Formulation: With patient Time For Goal Achievement: 10/16/23 Potential to Achieve Goals: Good  Plan      Co-evaluation                 AM-PAC OT 6 Clicks Daily Activity     Outcome Measure   Help from another person eating meals?: None Help from another person taking care of personal grooming?: A Little Help from another person toileting, which includes using toliet, bedpan, or urinal?: Total Help from another person bathing (including washing, rinsing, drying)?: A Lot Help from another person to put on and taking off regular upper body clothing?: A Lot Help from another person to put on and taking off regular lower body clothing?: Total 6 Click Score: 13    End of Session Equipment Utilized During Treatment: Gait belt;Rolling walker (2 wheels);Other (comment) [name] walker)  OT Visit Diagnosis: Unsteadiness on feet (R26.81);Other abnormalities of gait and mobility (R26.89);Muscle weakness (generalized) (M62.81);Other symptoms and signs involving cognitive function   Activity Tolerance Patient  tolerated treatment well   Patient Left in chair;with call bell/phone within reach;with nursing/sitter in room;with family/visitor present   Nurse Communication Mobility status        Time: 5784-6962 OT Time Calculation (min): 31 min  Charges: OT General Charges $OT Visit: 1 Visit OT Treatments $Self Care/Home Management : 8-22 mins  [name], OTR/L Acute Rehabilitation Services Office: [phone]  [name] 10/06/2023, 10:43 AM

## 2023-10-06 NOTE — Progress Notes (Signed)
 9 Days Post-Op Procedure(s) (LRB): CORONARY ARTERY BYPASS GRAFTING (CABG) TIMES FOUR UTILIZING THE LEFT INTERNAL MAMMARY ARTERY AND ENDOSCOPIC VEIN HARVEST RIGHT GREATER SAPHENOUS VEIN (N/A) ECHOCARDIOGRAM, TRANSESOPHAGEAL, INTRAOPERATIVE (N/A) Subjective: No complaints this AM Ambulated yesterday  Objective: Vital signs in last 24 hours: Temp:  [97.9 F (36.6 C)-99.2 F (37.3 C)] 97.9 F (36.6 C) (06/13 0700) Pulse Rate:  [92-113] 100 (06/13 0700) Cardiac Rhythm: Sinus tachycardia (06/13 0400) Resp:  [15-24] 19 (06/13 0700) BP: (99-147)/(65-84) 137/75 (06/13 0700) SpO2:  [94 %-100 %] 97 % (06/13 0700) Weight:  [93.4 kg] 93.4 kg (06/13 0500)  Hemodynamic parameters for last 24 hours:    Intake/Output from previous day: 06/12 0701 - 06/13 0700 In: 240 [P.O.:240] Out: 625 [Urine:625] Intake/Output this shift: No intake/output data recorded.  General appearance: alert, cooperative, and no distress Neurologic: intact Heart: mildly tachy, regular Lungs: clear to auscultation bilaterally Abdomen: normal findings: soft, non-tender Wound: minimal bloody drainage mid sternal wound  Lab Results: Recent Labs    10/04/23 0531 10/06/23 0533  WBC 12.6* 12.9*  HGB 9.8* 9.0*  HCT 31.3* 28.8*  PLT 223 329   BMET:  Recent Labs    10/05/23 0400 10/06/23 0533  NA 136 139  K 3.7 3.9  CL 102 103  CO2 25 25  GLUCOSE 93 83  BUN 25* 45*  CREATININE 2.96* 3.56*  CALCIUM  8.2* 8.5*    PT/INR: No results for input(s): LABPROT, INR in the last 72 hours. ABG    Component Value Date/Time   PHART 7.336 (L) 09/28/2023 0854   HCO3 20.5 09/28/2023 0854   TCO2 22 09/28/2023 0854   ACIDBASEDEF 5.0 (H) 09/28/2023 0854   O2SAT 62.8 10/06/2023 0533   CBG (last 3)  Recent Labs    10/05/23 1629 10/05/23 2139 10/06/23 0630  GLUCAP 140* 116* 82    Assessment/Plan: S/P Procedure(s) (LRB): CORONARY ARTERY BYPASS GRAFTING (CABG) TIMES FOUR UTILIZING THE LEFT INTERNAL MAMMARY  ARTERY AND ENDOSCOPIC VEIN HARVEST RIGHT GREATER SAPHENOUS VEIN (N/A) ECHOCARDIOGRAM, TRANSESOPHAGEAL, INTRAOPERATIVE (N/A) Looks great this morning NEURO- intact CV- in ST at 105, BP OK  Co-ox 63  Will start low dose Coreg   ASA, Plavix   Lipitor  RESP- IS RENAL- some UO yesterday- ~600 ml   Creatinine up to 3.56  For HD today ENDO- CBG well controlled Gi- tolerating diet Deconditioning- continue PT/OT   LOS: 15 days    [name] 10/06/2023

## 2023-10-07 DIAGNOSIS — R579 Shock, unspecified: Secondary | ICD-10-CM | POA: Diagnosis not present

## 2023-10-07 DIAGNOSIS — I214 Non-ST elevation (NSTEMI) myocardial infarction: Secondary | ICD-10-CM | POA: Diagnosis not present

## 2023-10-07 DIAGNOSIS — I5043 Acute on chronic combined systolic (congestive) and diastolic (congestive) heart failure: Secondary | ICD-10-CM | POA: Diagnosis not present

## 2023-10-07 DIAGNOSIS — N179 Acute kidney failure, unspecified: Secondary | ICD-10-CM | POA: Diagnosis not present

## 2023-10-07 LAB — RENAL FUNCTION PANEL
Albumin: 2.1 g/dL — ABNORMAL LOW (ref 3.5–5.0)
Anion gap: 10 (ref 5–15)
BUN: 32 mg/dL — ABNORMAL HIGH (ref 8–23)
CO2: 28 mmol/L (ref 22–32)
Calcium: 8.1 mg/dL — ABNORMAL LOW (ref 8.9–10.3)
Chloride: 97 mmol/L — ABNORMAL LOW (ref 98–111)
Creatinine, Ser: 2.91 mg/dL — ABNORMAL HIGH (ref 0.61–1.24)
GFR, Estimated: 22 mL/min — ABNORMAL LOW (ref >60)
Glucose, Bld: 173 mg/dL — ABNORMAL HIGH (ref 70–99)
Phosphorus: 3.4 mg/dL (ref 2.5–4.6)
Potassium: 3.7 mmol/L (ref 3.5–5.1)
Sodium: 135 mmol/L (ref 135–145)

## 2023-10-07 LAB — GLUCOSE, CAPILLARY
Glucose-Capillary: 158 mg/dL — ABNORMAL HIGH (ref 70–99)
Glucose-Capillary: 287 mg/dL — ABNORMAL HIGH (ref 70–99)
Glucose-Capillary: 160 mg/dL — ABNORMAL HIGH (ref 70–99)
Glucose-Capillary: 256 mg/dL — ABNORMAL HIGH (ref 70–99)

## 2023-10-07 LAB — COOXEMETRY PANEL
Carboxyhemoglobin: 1.5 % (ref 0.5–1.5)
Methemoglobin: <0.7 % (ref 0.0–1.5)
O2 Saturation: 60.5 %
Total hemoglobin: 8.8 g/dL — ABNORMAL LOW (ref 12.0–16.0)

## 2023-10-07 LAB — MAGNESIUM: Magnesium: 2.3 mg/dL (ref 1.7–2.4)

## 2023-10-07 LAB — HEPATITIS B SURFACE ANTIBODY, QUANTITATIVE: Hep B S AB Quant (Post): 31.9 m[IU]/mL

## 2023-10-07 MED ORDER — ASPIRIN 81 MG PO TBEC
81.0000 mg | DELAYED_RELEASE_TABLET | Freq: Every day | ORAL | Status: DC
  Administered 2023-10-08 – 2023-10-13 (×6): 81 mg via ORAL
  Filled 2023-10-07 (×6): qty 1

## 2023-10-07 MED ORDER — INSULIN GLARGINE-YFGN 100 UNIT/ML ~~LOC~~ SOLN
10.0000 [IU] | Freq: Every day | SUBCUTANEOUS | Status: DC
  Administered 2023-10-08 – 2023-10-10 (×3): 10 [IU] via SUBCUTANEOUS
  Filled 2023-10-07 (×4): qty 0.1

## 2023-10-07 NOTE — Progress Notes (Signed)
 Patient ID: [name], male   DOB: Apr 23, 1951, 73 y.o.   MRN: [id number]     Advanced Heart Failure Rounding Note  Cardiologist: Nicholas Lites, MD  Chief Complaint: Post cardiotomy shock Patient Profile   73 y.o. male with history of CAD, HFmrEF, uncontrolled DM II, HTN, HLD. Admitted with NSTEMI and acute on chronic CHF. Underwent CABG X 4 on 09/27/23 c/b postcardiotomy shock.  Subjective:    06/04: CABG X 4 with Dr. Luna Francis. Difficulty separating from cardiopulmonary bypass 06/05: Extubated   POD #10  Feels good. Co-ox stable at 61% off milrinone . Volume status looks good. Not much urine output this am  Denies CP or SOB. Able to ambulate floor  Objective:    Weight Range: 92.5 kg Body mass index is 35 kg/m.   Vital Signs:   Temp:  [97.4 F (36.3 C)-98.5 F (36.9 C)] 98.5 F (36.9 C) (06/14 0916) Pulse Rate:  [88-110] 97 (06/14 1000) Resp:  [5-29] 26 (06/14 1000) BP: (88-149)/(56-92) 125/74 (06/14 1000) SpO2:  [93 %-100 %] 95 % (06/14 1000) Weight:  [92.5 kg-93.4 kg] 92.5 kg (06/13 1814) Last BM Date : 10/03/23  Weight change: Filed Weights   10/06/23 0500 10/06/23 1403 10/06/23 1814  Weight: 93.4 kg 93.4 kg 92.5 kg   Intake/Output:  Intake/Output Summary (Last 24 hours) at 10/07/2023 1054 Last data filed at 10/07/2023 0800 Gross per 24 hour  Intake 720 ml  Output 1000 ml  Net -280 ml    Physical Exam   General:  Well appearing. No resp difficulty HEENT: normal Neck: supple. no JVD. Carotids 2+ bilat; no bruits. No lymphadenopathy or thryomegaly appreciated. Cor: PMI nondisplaced. Regular rate & rhythm. No rubs, gallops or murmurs. Lungs: clear Abdomen: soft, nontender, nondistended. No hepatosplenomegaly. No bruits or masses. Good bowel sounds. Extremities: no cyanosis, clubbing, rash, edema Neuro: alert & orientedx3, cranial nerves grossly intact. moves all 4 extremities w/o difficulty. Affect pleasant  Telemetry: sinus 90-100 Personally  reviewed  Labs    CBC Recent Labs    10/06/23 0533  WBC 12.9*  HGB 9.0*  HCT 28.8*  MCV 90.3  PLT 329   Basic Metabolic Panel Recent Labs    78/29/56 0533 10/07/23 0513  NA 139 135  K 3.9 3.7  CL 103 97*  CO2 25 28  GLUCOSE 83 173*  BUN 45* 32*  CREATININE 3.56* 2.91*  CALCIUM  8.5* 8.1*  MG 2.7* 2.3  PHOS 4.3 3.4   Liver Function Tests Recent Labs    10/06/23 0533 10/07/23 0513  ALBUMIN  2.2* 2.1*   BNP (last 3 results) Recent Labs    09/21/23 0806  BNP 128.5*   Medications:    Scheduled Medications:  (feeding supplement) PROSource Plus  30 mL Oral TID WC   alteplase   2 mg Intracatheter Once   aspirin   81 mg Oral Daily   atorvastatin   80 mg Oral Daily   bisacodyl   10 mg Oral Daily   Or   bisacodyl   10 mg Rectal Daily   carvedilol   3.125 mg Oral BID WC   Chlorhexidine  Gluconate Cloth  6 each Topical Daily   Chlorhexidine  Gluconate Cloth  6 each Topical Q0600   clopidogrel   75 mg Oral Daily   docusate sodium   200 mg Oral Daily   feeding supplement  1 Container Oral TID BM   heparin  injection (subcutaneous)  5,000 Units Subcutaneous Q8H   insulin  aspart  0-9 Units Subcutaneous TID WC   insulin  glargine-yfgn  5 Units  Subcutaneous Daily   multivitamin  1 tablet Oral QHS   traZODone   50 mg Oral QHS   Infusions:  albumin  human     anticoagulant sodium citrate      PRN Medications: alteplase , anticoagulant sodium citrate, dextrose , heparin , levalbuterol , lidocaine  (PF), lidocaine -prilocaine, ondansetron  (ZOFRAN ) IV, mouth rinse, oxyCODONE , pentafluoroprop-tetrafluoroeth, phenol, traMADol   Assessment/Plan   1. Acute systolic CHF/Cardiogenic shock: Ischemic cardiomyopathy. Echo pre-CABG with EF 35-40%, RV hard to visualize. Difficulty separating from cardiopulmonary bypass following CABG 09/27/23. Placed on low dose milrinone  0.125 for RV support, now off.  - Echo 09/30/23 EF 25-30%  - co-ox stable off milrinone  Volume status ok - Renal planning iHD  tomorrow  - continue carvedilol  - Not candidate for ARB/ARNI/MRA or SGLT2i yet with renal failure. Can add Bidil as tolerated  2. CAD: NSTEMI. Hx PCI to LAD in 2005.  NSTEMI 09/21/23 with multivessel disease on LHC. S/p CABG X 4 (LIMA to LAD, SVG to OM1, sequential to OM3, SVG to PDA) on 09/27/23 - No s/s angina - Continue ASA + plavix  75 mg daily - Continue atorva 80. LDL 121 (not adherent with home meds prior to admit)  3. Acute Renal Failure: CKD 3 at baseline. Admission creatinine was 1.9. Creatinine peaked above 4, worsening uremic symptoms. Started on CRRT for clearance and volume removal. - Getting some renal recovery. Off CVVHD - Renal following for PRN iHD. Planned for tomorrow  4. Anemia: Post-op, hgb stable.  5. DM II: Uncontrolled, A1c 11.3% - Insulin  per TCTS  Ok for 4E from our standpoint .  Length of Stay: 16   Nicholas Oar, MD 10/07/2023, 10:54 AM  Advanced Heart Failure Team Pager [phone] (M-F; 7a - 5p)

## 2023-10-07 NOTE — Progress Notes (Signed)
 Patient ID: [name], male   DOB: 04/21/1951, 73 y.o.   MRN: [id number] [facility] Progress Note   Assessment/ Plan:    1.  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 (BL Cr 1.3 - 1.6).  Developed nonoliguric postoperative acute kidney injury likely from ischemic ATN given difficulties of weaning from cardiopulmonary bypass.  Started on CRRT on 6/7 for management of volume status (fixed urine output on high-dose furosemide ) and azotemia with mental status improving after CRRT per charting  ------------------ - Anticipate next HD on Monday, 6/16.  Assess dialysis needs daily  - Await AM labs - has a nontunneled catheter.  If he continues to need dialysis will need a tunneled catheter placed early next week   2.  NSTEMI: With multivessel coronary artery disease and underwent four-vessel CABG on 09/27/2023.  Presented 09/21/23.  Postoperative course significant for difficulty with getting him off cardiopulmonary bypass and developed postoperative shock with pressor/inotrope support. - post-operative care per CT surgery   3.  Postoperative shock: Previously on norepinephrine , epinephrine  (now off)  - s/p milrinone  per CHF team  - now off  4.  Normocytic Anemia: Likely associated with surgical/postoperative blood loss.  - PRBC's per primary team  - Defer ESA for now with recent NSTEMI but anticipate starting when acceptable perhaps as early as next week if needed - trend labs - CBC in AM  5. CKD stage 3a - note baseline Cr 1.3 - 1.6   Disposition - continue inpatient monitoring (dialysis dependent AKI)    Subjective:   He had 2 unmeasured urine voids over 6/13.  Lasts HD on 6/13 with 1 kg UF.  He states that he's making a good bit of urine and I asked him to please make sure that we are able to document this.  We discussed dispo planning from nephro standpoint.    Review of systems:        Denies n/v; 2 BM's yesterday   Denies shortness of breath or chest pain    Objective:   BP 102/67   Pulse 93   Temp 98.3 F (36.8 C) (Oral)   Resp 12   Ht 5' 4 (1.626 m)   Wt 92.5 kg   SpO2 98%   BMI 35.00 kg/m   Intake/Output Summary (Last 24 hours) at 10/07/2023 0601 Last data filed at 10/06/2023 1900 Gross per 24 hour  Intake 840 ml  Output 1325 ml  Net -485 ml   Weight change: 0 kg  Physical Exam:       General adult male in bed in no acute distress HEENT normocephalic atraumatic extraocular movements intact sclera anicteric Neck supple trachea midline Lungs clear to auscultation bilaterally normal work of breathing at rest on room air; midline sternal incision  Heart S1S2 tacju Abdomen soft nontender distended Extremities no edema  Psych normal mood and affect; no anxiety or agitation  Neuro awake and conversant; oriented to person and year and location  GU no foley  Access: left internal jugular nontunneled dialysis catheter    Imaging: No results found.    Labs: BMET Recent Labs  Lab 10/02/23 1600 10/03/23 0500 10/03/23 1705 10/04/23 0531 10/04/23 1606 10/05/23 0400 10/06/23 0533  NA 131* 136 134* 135 136 136 139  K 4.0 3.8 3.9 3.7 4.1 3.7 3.9  CL 100 102 99 102 100 102 103  CO2 25 24 22 24 25 25 25   GLUCOSE  230* 77 244* 124* 138* 93 83  BUN 15 13 18 17 18  25* 45*  CREATININE 1.83* 1.75* 1.85* 1.90* 1.82* 2.96* 3.56*  CALCIUM  7.4* 8.1* 8.1* 8.3* 8.4* 8.2* 8.5*  PHOS 2.3* 3.0 2.6 2.2* 2.9 3.2 4.3   CBC Recent Labs  Lab 10/02/23 0410 10/03/23 0500 10/04/23 0531 10/06/23 0533  WBC 9.8 12.3* 12.6* 12.9*  HGB 9.2* 10.1* 9.8* 9.0*  HCT 30.1* 31.9* 31.3* 28.8*  MCV 92.0 89.6 89.7 90.3  PLT 163 195 223 329    Medications:     (feeding supplement) PROSource Plus  30 mL Oral TID WC   alteplase   2 mg Intracatheter Once   aspirin   81 mg Oral Daily   atorvastatin   80 mg Oral Daily   bisacodyl   10 mg Oral Daily   Or   bisacodyl   10 mg  Rectal Daily   carvedilol   3.125 mg Oral BID WC   Chlorhexidine  Gluconate Cloth  6 each Topical Daily   Chlorhexidine  Gluconate Cloth  6 each Topical Q0600   clopidogrel   75 mg Oral Daily   docusate sodium   200 mg Oral Daily   feeding supplement  1 Container Oral TID BM   heparin  injection (subcutaneous)  5,000 Units Subcutaneous Q8H   insulin  aspart  0-9 Units Subcutaneous TID WC   insulin  glargine-yfgn  5 Units Subcutaneous Daily   multivitamin  1 tablet Oral QHS   traZODone   50 mg Oral QHS    Nan Aver, MD 10/07/2023, 6:15 AM

## 2023-10-07 NOTE — Progress Notes (Addendum)
      [address]       [place] [address]             [phone]      10 Days Post-Op Procedure(s) (LRB): CORONARY ARTERY BYPASS GRAFTING (CABG) TIMES FOUR UTILIZING THE LEFT INTERNAL MAMMARY ARTERY AND ENDOSCOPIC VEIN HARVEST RIGHT GREATER SAPHENOUS VEIN (N/A) ECHOCARDIOGRAM, TRANSESOPHAGEAL, INTRAOPERATIVE (N/A)  Subjective:  Patient resting comfortably.  Overall feeling good today.  Denies chest pain or shortness of breath  Objective: Vital signs in last 24 hours: Temp:  [97.4 F (36.3 C)-98.7 F (37.1 C)] 98.7 F (37.1 C) (06/14 1208) Pulse Rate:  [86-110] 86 (06/14 1300) Cardiac Rhythm: Normal sinus rhythm (06/14 0800) Resp:  [5-29] 18 (06/14 1300) BP: (88-149)/(56-92) 121/63 (06/14 1300) SpO2:  [93 %-100 %] 94 % (06/14 1300) Weight:  [92.5 kg-93.4 kg] 92.5 kg (06/13 1814)  Intake/Output from previous day: 06/13 0701 - 06/14 0700 In: 840 [P.O.:840] Out: 1000  Intake/Output this shift: Total I/O In: 240 [P.O.:240] Out: 200 [Urine:200]  General appearance: alert, cooperative, and no distress Heart: regular rate and rhythm Lungs: clear to auscultation bilaterally Abdomen: soft, non-tender; bowel sounds normal; no masses,  no organomegaly Extremities: edema trace Wound: clean and dry  Lab Results: Recent Labs    10/06/23 0533  WBC 12.9*  HGB 9.0*  HCT 28.8*  PLT 329   BMET:  Recent Labs    10/06/23 0533 10/07/23 0513  NA 139 135  K 3.9 3.7  CL 103 97*  CO2 25 28  GLUCOSE 83 173*  BUN 45* 32*  CREATININE 3.56* 2.91*  CALCIUM  8.5* 8.1*    PT/INR: No results for input(s): LABPROT, INR in the last 72 hours. ABG    Component Value Date/Time   PHART 7.336 (L) 09/28/2023 0854   HCO3 20.5 09/28/2023 0854   TCO2 22 09/28/2023 0854   ACIDBASEDEF 5.0 (H) 09/28/2023 0854   O2SAT 60.5 10/07/2023 0513   CBG (last 3)  Recent Labs    10/06/23 2111 10/07/23 0606 10/07/23 1205  GLUCAP 260* 158* 287*    Assessment/Plan: S/P  Procedure(s) (LRB): CORONARY ARTERY BYPASS GRAFTING (CABG) TIMES FOUR UTILIZING THE LEFT INTERNAL MAMMARY ARTERY AND ENDOSCOPIC VEIN HARVEST RIGHT GREATER SAPHENOUS VEIN (N/A) ECHOCARDIOGRAM, TRANSESOPHAGEAL, INTRAOPERATIVE (N/A)  CV- HfrEF, maintaining NSR- on Coreg , Plavix .. AHF optimizing GDMT Pulm- no acute issues, continue IS Renal- AKI on CKD Stage III-off CRRT, per nephrology will likely start HD on Monday 6/16...needs a tunneled dialysis catheter Expected post operative blood loss anemia, Hgb 9.0 not clinically significant DM- sugars are elevated, will increase semglee  to 10 units daily continue SSIP Deconditioning- continue PT/OT.Aaron Aas recommending CIR placement    LOS: 16 days    Gates Kasal, PA-C 10/07/2023   Chart reviewed, patient examined, agree with above.  He tolerated HD yesterday without difficulty. Planning HD Monday depending on kidney recovery. Eating well. Ambulated today. He is in good spirits and seems motivated to get better.

## 2023-10-07 NOTE — Progress Notes (Signed)
 NAME:  [name], MRN:  [id number], DOB:  Aug 03, 1950, LOS: 16 ADMISSION DATE:  09/21/2023, CONSULTATION DATE: 09/28/2023 REFERRING MD: Dr. [name], CHIEF COMPLAINT: Status post CABG x 4  History of Present Illness:  73 year old male with coronary artery disease, diabetes type 2, hypertension, CKD stage IIIa, hyperlipidemia and chronic HFrEF who initially presented about a week ago with epigastric pain radiating to the chest, was admitted with acute NSTEMI, he underwent cardiac cath which showed severe multivessel coronary artery disease with elevated LVEDP.  Patient underwent CABG x 4, he had decreased urine output post CABG, he was not extubated per rapid weaning protocol, remain on vasopressor support with Levophed , epinephrine  and milrinone .  PCCM was consulted for help evaluation medical management   Pertinent  Medical History   Past Medical History:  Diagnosis Date   CAD (coronary artery disease)    Diabetes mellitus without complication (HCC)    Hypercholesterolemia    Hypertension    MI (myocardial infarction) (HCC)    Renal insufficiency     Significant Hospital Events: Including procedures, antibiotic start and stop dates in addition to other pertinent events   5/29 admitted with acute NSTEMI 6/4 CABG x 4 6/5 PCCM consulted 6/7 start CRRT, bowel movements after enemas. Minimal NGT output.  6/8 foley removed, able to start mobility, started clear liquids 6/10 d/c RIJ introducer, con't milrinone . 6/11 On CRRT yesterday, worked with PT.  Afebrile. He denies complaints today. 6/12 no issues overnight remains off all continuous infusions and off CRRT  Interim History / Subjective:  No overnight issues Had IHD yesterday, tolerated well Walked in the hallway, stated appetite is good  Objective    Blood pressure 125/74, pulse 97, temperature 98.5 F (36.9 C), temperature source Oral, resp. rate (!) 26, height 5' 4 (1.626 m), weight 92.5 kg, SpO2 95%.         Intake/Output Summary (Last 24 hours) at 10/07/2023 1034 Last data filed at 10/07/2023 0800 Gross per 24 hour  Intake 720 ml  Output 1000 ml  Net -280 ml   Filed Weights   10/06/23 0500 10/06/23 1403 10/06/23 1814  Weight: 93.4 kg 93.4 kg 92.5 kg    Examination: General: Elderly male, lying on the bed HEENT: [place]/AT, eyes anicteric.  moist mucus membranes.  Dialysis catheter is noted on left IJ Neuro: Alert, awake following commands Chest: Central sternotomy incision looks clean and dry, coarse breath sounds, no wheezes or rhonchi Heart: Regular rate and rhythm, no murmurs or gallops Abdomen: Soft, nontender, nondistended, bowel sounds present  Labs and images reviewed  Patient Lines/Drains/Airways Status     Active Line/Drains/Airways     Name Placement date Placement time Site Days   Peripheral IV 09/26/23 22 G 1.75 Left;Posterior Forearm 09/26/23  1624  Forearm  10   Hemodialysis Catheter Left Internal jugular Triple lumen Temporary (Non-Tunneled) 09/30/23  1200  Internal jugular  6   Incision (Closed) 09/27/23 Chest Other (Comment) 09/27/23  1027  -- 9   Incision (Closed) 09/27/23 Leg Right 09/27/23  1027  -- 9   Wound / Incision (Open or Dehisced) 09/30/23 (MARSI) Medical Adhesive-Related Skin Injury Chest Upper;Left small, irregular skin tear due to island dressing removal 09/30/23  0800  Chest  6         Resolved problem list  Acute respiratory failure with hypoxia Cardiogenic shock Postop ileus  Assessment and Plan  Multivessel coronary artery disease, presented with acute NSTEMI s/p CABG x 4 Continue aspirin , Plavix  and statin Pain  is well-controlled Continue as needed tramadol  and oxycodone   Acute on chronic HFrEF Ejection fraction is 25 to 30% Continue Coreg  GDMT as tolerated Advanced heart failure team is following  AKI on CKD stage IIIa due to ischemic ATN, required CRRT Patient tolerated IHD yesterday Making urine Nephrology is  following  Hypertension Continue Coreg   Hyperlipidemia Continue atorvastatin   Poorly controlled diabetes type 2 with hyperglycemia Patient hemoglobin A1c is 11.3 Blood sugars are better controlled now Continue Lantus  5 units daily Continue sliding scale insulin  with CBG goal 140-180  Expected perioperative blood loss anemia Monitor H/H and PLT counts  Obesity Diet and exercise counseling provided   Best Practice (right click and Reselect all SmartList Selections daily)   Diet/type: Regular consistency DVT prophylaxis: Subcu heparin  GI prophylaxis: N/A Lines: HD catheter, still needed Foley:  Yes, and it is still needed Code Status:  full code Last date of multidisciplinary goals of care discussion [Per primary team]   Labs   CBC: Recent Labs  Lab 10/01/23 0500 10/02/23 0410 10/03/23 0500 10/04/23 0531 10/06/23 0533  WBC 9.9 9.8 12.3* 12.6* 12.9*  HGB 9.2* 9.2* 10.1* 9.8* 9.0*  HCT 29.4* 30.1* 31.9* 31.3* 28.8*  MCV 91.6 92.0 89.6 89.7 90.3  PLT 184 163 195 223 329    Basic Metabolic Panel: Recent Labs  Lab 10/03/23 0500 10/03/23 1705 10/04/23 0531 10/04/23 1606 10/05/23 0400 10/06/23 0533 10/07/23 0513  NA 136   < > 135 136 136 139 135  K 3.8   < > 3.7 4.1 3.7 3.9 3.7  CL 102   < > 102 100 102 103 97*  CO2 24   < > 24 25 25 25 28   GLUCOSE 77   < > 124* 138* 93 83 173*  BUN 13   < > 17 18 25* 45* 32*  CREATININE 1.75*   < > 1.90* 1.82* 2.96* 3.56* 2.91*  CALCIUM  8.1*   < > 8.3* 8.4* 8.2* 8.5* 8.1*  MG 2.6*  --  2.6*  --  2.8* 2.7* 2.3  PHOS 3.0   < > 2.2* 2.9 3.2 4.3 3.4   < > = values in this interval not displayed.   GFR: Estimated Creatinine Clearance: 23.2 mL/min (A) (by C-G formula based on SCr of 2.91 mg/dL (H)). Recent Labs  Lab 10/02/23 0410 10/03/23 0500 10/04/23 0531 10/06/23 0533  WBC 9.8 12.3* 12.6* 12.9*    Liver Function Tests: Recent Labs  Lab 10/04/23 0531 10/04/23 1606 10/05/23 0400 10/06/23 0533 10/07/23 0513   ALBUMIN  2.5* 2.6* 2.4* 2.2* 2.1*   No results for input(s): LIPASE, AMYLASE in the last 168 hours. No results for input(s): AMMONIA in the last 168 hours.  ABG    Component Value Date/Time   PHART 7.336 (L) 09/28/2023 0854   PCO2ART 38.4 09/28/2023 0854   PO2ART 87 09/28/2023 0854   HCO3 20.5 09/28/2023 0854   TCO2 22 09/28/2023 0854   ACIDBASEDEF 5.0 (H) 09/28/2023 0854   O2SAT 60.5 10/07/2023 0513     Coagulation Profile: No results for input(s): INR, PROTIME in the last 168 hours.   Cardiac Enzymes: No results for input(s): CKTOTAL, CKMB, CKMBINDEX, TROPONINI in the last 168 hours.  HbA1C: HbA1c, POC (controlled diabetic range)  Date/Time Value Ref Range Status  03/01/2023 10:46 AM 12.9 (A) 0.0 - 7.0 % Final  08/31/2022 10:03 AM 13.1 (A) 0.0 - 7.0 % Final   HbA1c POC (<> result, manual entry)  Date/Time Value Ref Range Status  07/24/2023 11:30  AM >15.0 4.0 - 5.6 % Final   Hgb A1c MFr Bld  Date/Time Value Ref Range Status  09/22/2023 11:30 AM 11.3 (H) 4.8 - 5.6 % Final    Comment:    (NOTE) Diagnosis of Diabetes The following HbA1c ranges recommended by the American Diabetes Association (ADA) may be used as an aid in the diagnosis of diabetes mellitus.  Hemoglobin             Suggested A1C NGSP%              Diagnosis  <5.7                   Non Diabetic  5.7-6.4                Pre-Diabetic  >6.4                   Diabetic  <7.0                   Glycemic control for                       adults with diabetes.      CBG: Recent Labs  Lab 10/06/23 0630 10/06/23 1107 10/06/23 1621 10/06/23 2111 10/07/23 0606  GLUCAP 82 167* 158* 260* 158*     Trevor Fudge, MD [facility] See Amion for pager If no response to pager, please call [phone] until 7pm After 7pm, Please call E-link [phone]

## 2023-10-08 DIAGNOSIS — I214 Non-ST elevation (NSTEMI) myocardial infarction: Secondary | ICD-10-CM | POA: Diagnosis not present

## 2023-10-08 LAB — COOXEMETRY PANEL
Carboxyhemoglobin: 2.3 % — ABNORMAL HIGH (ref 0.5–1.5)
Methemoglobin: <0.7 % (ref 0.0–1.5)
O2 Saturation: 86.1 %
Total hemoglobin: 8.3 g/dL — ABNORMAL LOW (ref 12.0–16.0)

## 2023-10-08 LAB — GLUCOSE, CAPILLARY
Glucose-Capillary: 210 mg/dL — ABNORMAL HIGH (ref 70–99)
Glucose-Capillary: 222 mg/dL — ABNORMAL HIGH (ref 70–99)
Glucose-Capillary: 187 mg/dL — ABNORMAL HIGH (ref 70–99)
Glucose-Capillary: 299 mg/dL — ABNORMAL HIGH (ref 70–99)

## 2023-10-08 LAB — CBC
HCT: 26 % — ABNORMAL LOW (ref 39.0–52.0)
Hemoglobin: 8.1 g/dL — ABNORMAL LOW (ref 13.0–17.0)
MCH: 28.1 pg (ref 26.0–34.0)
MCHC: 31.2 g/dL (ref 30.0–36.0)
MCV: 90.3 fL (ref 80.0–100.0)
Platelets: 340 10*3/uL (ref 150–400)
RBC: 2.88 MIL/uL — ABNORMAL LOW (ref 4.22–5.81)
RDW: 14.2 % (ref 11.5–15.5)
WBC: 12.6 10*3/uL — ABNORMAL HIGH (ref 4.0–10.5)
nRBC: 0.2 % (ref 0.0–0.2)

## 2023-10-08 LAB — MAGNESIUM: Magnesium: 2.4 mg/dL (ref 1.7–2.4)

## 2023-10-08 LAB — RENAL FUNCTION PANEL
Albumin: 2.1 g/dL — ABNORMAL LOW (ref 3.5–5.0)
Anion gap: 11 (ref 5–15)
BUN: 49 mg/dL — ABNORMAL HIGH (ref 8–23)
CO2: 26 mmol/L (ref 22–32)
Calcium: 7.9 mg/dL — ABNORMAL LOW (ref 8.9–10.3)
Chloride: 98 mmol/L (ref 98–111)
Creatinine, Ser: 3.49 mg/dL — ABNORMAL HIGH (ref 0.61–1.24)
GFR, Estimated: 18 mL/min — ABNORMAL LOW (ref >60)
Glucose, Bld: 183 mg/dL — ABNORMAL HIGH (ref 70–99)
Phosphorus: 4.4 mg/dL (ref 2.5–4.6)
Potassium: 3.7 mmol/L (ref 3.5–5.1)
Sodium: 135 mmol/L (ref 135–145)

## 2023-10-08 MED ORDER — INSULIN ASPART 100 UNIT/ML IJ SOLN
3.0000 [IU] | Freq: Three times a day (TID) | INTRAMUSCULAR | Status: DC
  Administered 2023-10-08 – 2023-10-09 (×3): 3 [IU] via SUBCUTANEOUS

## 2023-10-08 NOTE — Progress Notes (Signed)
 Patient ID: [name], male   DOB: 09-09-1950, 73 y.o.   MRN: [id number]  TCTS Evening Rounds:  Hemodynamically stable.  425 cc UO plus some unmeasured urine.  Sats 96% RA.  Ambulated 3 times today.  Should be able to go to 4E in am.

## 2023-10-08 NOTE — Progress Notes (Signed)
 Patient ID: [name], male   DOB: Dec 19, 1950, 73 y.o.   MRN: [id number] [facility] Progress Note   Assessment/ Plan:    1.  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 (BL Cr 1.3 - 1.6).  Developed nonoliguric postoperative acute kidney injury likely from ischemic ATN given difficulties of weaning from cardiopulmonary bypass.  Started on CRRT on 6/7 for management of volume status (fixed urine output on high-dose furosemide ) and azotemia with mental status improving after CRRT per charting  ------------------ - Will hold HD on Monday, 6/16.  He's lying flat this morning and starting to make more urine.  Anticipate may need HD on Tues, 6/17.  Assess dialysis needs daily  - has a nontunneled catheter.  If he continues to need dialysis will need a tunneled catheter placed early this week   2.  NSTEMI: With multivessel coronary artery disease and underwent four-vessel CABG on 09/27/2023.  Presented 09/21/23.  Postoperative course significant for difficulty with getting him off cardiopulmonary bypass and developed postoperative shock with pressor/inotrope support. - post-operative care per CT surgery   3.  Postoperative shock: Previously on norepinephrine , epinephrine  (now off)  - s/p milrinone  per CHF team  - now off  4.  Normocytic Anemia: Likely associated with surgical/postoperative blood loss.  - PRBC's per primary team  - Defer ESA for now with recent NSTEMI but anticipate starting when acceptable perhaps as early as next week if needed - trend labs  5. CKD stage 3a - note baseline Cr 1.3 - 1.6   Disposition - continue inpatient monitoring (dialysis dependent AKI)    Subjective:   He had 275 mL UOP over 6/14 as well as 1 unmeasured urine voidLast HD on 6/13 with 1 kg UF.   He feels ok.  Yesterday out of bed again to chair.    Review of systems:        Denies n/v Denies shortness of  breath or chest pain    Objective:   BP 106/72 (BP Location: Right Arm)   Pulse 86   Temp 98.4 F (36.9 C) (Oral)   Resp 16   Ht 5' 4 (1.626 m)   Wt 93.3 kg   SpO2 97%   BMI 35.31 kg/m   Intake/Output Summary (Last 24 hours) at 10/08/2023 0601 Last data filed at 10/08/2023 0500 Gross per 24 hour  Intake 480 ml  Output 275 ml  Net 205 ml   Weight change: -0.1 kg  Physical Exam:      General adult male in bed in no acute distress HEENT normocephalic atraumatic extraocular movements intact sclera anicteric Neck supple trachea midline Lungs clear to auscultation bilaterally normal work of breathing at rest on room air; midline sternal incision  Heart S1S2  Abdomen soft nontender distended Extremities no edema  Psych normal mood and affect; no anxiety or agitation  Neuro awake and conversant; oriented to person and year and location  GU no foley  Access: left internal jugular nontunneled dialysis catheter    Imaging: No results found.    Labs: BMET Recent Labs  Lab 10/03/23 1705 10/04/23 0531 10/04/23 1606 10/05/23 0400 10/06/23 0533 10/07/23 0513 10/08/23 0457  NA 134* 135 136 136 139 135 135  K 3.9 3.7 4.1 3.7 3.9 3.7 3.7  CL 99 102 100 102 103 97* 98  CO2 22 24 25 25 25 28 26   GLUCOSE 244* 124* 138* 93 83 173*  183*  BUN 18 17 18  25* 45* 32* 49*  CREATININE 1.85* 1.90* 1.82* 2.96* 3.56* 2.91* 3.49*  CALCIUM  8.1* 8.3* 8.4* 8.2* 8.5* 8.1* 7.9*  PHOS 2.6 2.2* 2.9 3.2 4.3 3.4 4.4   CBC Recent Labs  Lab 10/03/23 0500 10/04/23 0531 10/06/23 0533 10/08/23 0457  WBC 12.3* 12.6* 12.9* 12.6*  HGB 10.1* 9.8* 9.0* 8.1*  HCT 31.9* 31.3* 28.8* 26.0*  MCV 89.6 89.7 90.3 90.3  PLT 195 223 329 340    Medications:     (feeding supplement) PROSource Plus  30 mL Oral TID WC   alteplase   2 mg Intracatheter Once   aspirin  EC  81 mg Oral Daily   atorvastatin   80 mg Oral Daily   bisacodyl   10 mg Oral Daily   Or   bisacodyl   10 mg Rectal Daily   carvedilol    3.125 mg Oral BID WC   Chlorhexidine  Gluconate Cloth  6 each Topical Daily   Chlorhexidine  Gluconate Cloth  6 each Topical Q0600   clopidogrel   75 mg Oral Daily   docusate sodium   200 mg Oral Daily   feeding supplement  1 Container Oral TID BM   heparin  injection (subcutaneous)  5,000 Units Subcutaneous Q8H   insulin  aspart  0-9 Units Subcutaneous TID WC   insulin  glargine-yfgn  10 Units Subcutaneous Daily   multivitamin  1 tablet Oral QHS   traZODone   50 mg Oral QHS    Nan Aver, MD 10/08/2023, 6:11 AM

## 2023-10-08 NOTE — Progress Notes (Signed)
 Patient ID: [name], male   DOB: 04-07-1951, 73 y.o.   MRN: [id number]     Advanced Heart Failure Rounding Note  Cardiologist: Hazle Lites, MD  Chief Complaint: Post cardiotomy shock Patient Profile   73 y.o. male with history of CAD, HFmrEF, uncontrolled DM II, HTN, HLD. Admitted with NSTEMI and acute on chronic CHF. Underwent CABG X 4 on 09/27/23 c/b postcardiotomy shock.  Subjective:    06/04: CABG X 4 with Dr. [name]. Difficulty separating from cardiopulmonary bypass 06/05: Extubated   POD #11  Feels good. Co-ox 86% (?) off milrinone . Volume status looks good. Not much urine output this am  Denies CP or SOB. Able to ambulate floor  Making a little bit of urine (only about 300cc so far today) Scr 2.91 -> 3.49   Objective:    Weight Range: 93.3 kg Body mass index is 35.31 kg/m.   Vital Signs:   Temp:  [98.2 F (36.8 C)-98.6 F (37 C)] 98.2 F (36.8 C) (06/15 0400) Pulse Rate:  [80-96] 88 (06/15 1200) Resp:  [11-32] 22 (06/15 1200) BP: (93-135)/(54-77) 115/63 (06/15 1000) SpO2:  [89 %-99 %] 89 % (06/15 1200) Weight:  [93.3 kg] 93.3 kg (06/15 0500) Last BM Date : 10/03/23  Weight change: Filed Weights   10/06/23 1403 10/06/23 1814 10/08/23 0500  Weight: 93.4 kg 92.5 kg 93.3 kg   Intake/Output:  Intake/Output Summary (Last 24 hours) at 10/08/2023 1237 Last data filed at 10/08/2023 1200 Gross per 24 hour  Intake 720 ml  Output 275 ml  Net 445 ml    Physical Exam   General:  Sitting up in bed NAD HEENT: normal Neck: supple. JVP 7-8 Carotids 2+ bilat; no bruits. No lymphadenopathy or thryomegaly appreciated. Cor: Regular rate & rhythm. No rubs, gallops or murmurs. Lungs: clear Abdomen: soft, nontender, nondistended. No hepatosplenomegaly. No bruits or masses. Good bowel sounds. Extremities: no cyanosis, clubbing, rash, edema Neuro: alert & orientedx3, cranial nerves grossly intact. moves all 4 extremities w/o difficulty. Affect  pleasant   Telemetry: sinus 80-90s Personally reviewed  Labs    CBC Recent Labs    10/06/23 0533 10/08/23 0457  WBC 12.9* 12.6*  HGB 9.0* 8.1*  HCT 28.8* 26.0*  MCV 90.3 90.3  PLT 329 340   Basic Metabolic Panel Recent Labs    69/62/95 0513 10/08/23 0457  NA 135 135  K 3.7 3.7  CL 97* 98  CO2 28 26  GLUCOSE 173* 183*  BUN 32* 49*  CREATININE 2.91* 3.49*  CALCIUM  8.1* 7.9*  MG 2.3 2.4  PHOS 3.4 4.4   Liver Function Tests Recent Labs    10/07/23 0513 10/08/23 0457  ALBUMIN  2.1* 2.1*   BNP (last 3 results) Recent Labs    09/21/23 0806  BNP 128.5*   Medications:    Scheduled Medications:  (feeding supplement) PROSource Plus  30 mL Oral TID WC   alteplase   2 mg Intracatheter Once   aspirin  EC  81 mg Oral Daily   atorvastatin   80 mg Oral Daily   bisacodyl   10 mg Oral Daily   Or   bisacodyl   10 mg Rectal Daily   carvedilol   3.125 mg Oral BID WC   Chlorhexidine  Gluconate Cloth  6 each Topical Q0600   clopidogrel   75 mg Oral Daily   docusate sodium   200 mg Oral Daily   feeding supplement  1 Container Oral TID BM   heparin  injection (subcutaneous)  5,000 Units Subcutaneous Q8H   insulin  aspart  0-9 Units Subcutaneous TID WC   insulin  aspart  3 Units Subcutaneous TID WC   insulin  glargine-yfgn  10 Units Subcutaneous Daily   multivitamin  1 tablet Oral QHS   traZODone   50 mg Oral QHS   Infusions:  albumin  human     anticoagulant sodium citrate      PRN Medications: alteplase , anticoagulant sodium citrate, dextrose , heparin , levalbuterol , lidocaine  (PF), lidocaine -prilocaine, ondansetron  (ZOFRAN ) IV, mouth rinse, oxyCODONE , pentafluoroprop-tetrafluoroeth, phenol, traMADol   Assessment/Plan   1. Acute systolic CHF/Cardiogenic shock: Ischemic cardiomyopathy. Echo pre-CABG with EF 35-40%, RV hard to visualize. Difficulty separating from cardiopulmonary bypass following CABG 09/27/23. Placed on low dose milrinone  0.125 for RV support, now off.  - Echo  09/30/23 EF 25-30%  - co-ox stable off milrinone  - Renal planning IHD tomorrow - continue carvedilol  - Not candidate for ARB/ARNI/MRA or SGLT2i yet with renal failure. Can add Bidil as tolerated but would not push BP too low as we await renal recovery  2. CAD: NSTEMI. Hx PCI to LAD in 2005.  NSTEMI 09/21/23 with multivessel disease on LHC. S/p CABG X 4 (LIMA to LAD, SVG to OM1, sequential to OM3, SVG to PDA) on 09/27/23 - No s/s angina - Continue ASA + plavix  75 mg daily - Continue atorva 80. LDL 121 (not adherent with home meds prior to admit)  3. Acute Renal Failure: CKD 3 at baseline. Admission creatinine was 1.9. Creatinine peaked above 4, worsening uremic symptoms. Started on CRRT for clearance and volume removal. - Getting some renal recovery. Off CVVHD - Renal following for PRN iHD. Planned for tomorrow  4. Anemia: Post-op, hgb stable at 8.1  5. DM II: Uncontrolled, A1c 11.3% - Insulin  per TCTS  6. Physical deconditioning - Struggling with balance - CIR following  Ok for 4E from our standpoint .  Length of Stay: 17   Jules Oar, MD 10/08/2023, 12:37 PM  Advanced Heart Failure Team Pager [phone] (M-F; 7a - 5p)

## 2023-10-08 NOTE — Plan of Care (Signed)
   Problem: Coping: Goal: Ability to adjust to condition or change in health will improve Outcome: Progressing   Problem: Nutritional: Goal: Maintenance of adequate nutrition will improve Outcome: Progressing   Problem: Skin Integrity: Goal: Risk for impaired skin integrity will decrease Outcome: Progressing

## 2023-10-08 NOTE — Progress Notes (Signed)
 11 Days Post-Op Procedure(s) (LRB): CORONARY ARTERY BYPASS GRAFTING (CABG) TIMES FOUR UTILIZING THE LEFT INTERNAL MAMMARY ARTERY AND ENDOSCOPIC VEIN HARVEST RIGHT GREATER SAPHENOUS VEIN (N/A) ECHOCARDIOGRAM, TRANSESOPHAGEAL, INTRAOPERATIVE (N/A) Subjective:  No complaints this am. Ambulating to bathroom with assistance. Balance is still not good. Eating well. Bowels moving.  Objective: Vital signs in last 24 hours: Temp:  [98.2 F (36.8 C)-98.7 F (37.1 C)] 98.2 F (36.8 C) (06/15 0400) Pulse Rate:  [80-96] 83 (06/15 1000) Cardiac Rhythm: Normal sinus rhythm (06/15 0815) Resp:  [11-32] 22 (06/15 1000) BP: (93-135)/(54-77) 115/63 (06/15 1000) SpO2:  [94 %-99 %] 96 % (06/15 1000) Weight:  [93.3 kg] 93.3 kg (06/15 0500)  Hemodynamic parameters for last 24 hours: CVP:  [3 mmHg-69 mmHg] 69 mmHg  Intake/Output from previous day: 06/14 0701 - 06/15 0700 In: 480 [P.O.:480] Out: 275 [Urine:275] Intake/Output this shift: Total I/O In: 240 [P.O.:240] Out: 200 [Urine:200]  General appearance: alert and cooperative Neurologic: intact Heart: regular rate and rhythm Lungs: clear to auscultation bilaterally Extremities: no edema Wound: incision healing well  Lab Results: Recent Labs    10/06/23 0533 10/08/23 0457  WBC 12.9* 12.6*  HGB 9.0* 8.1*  HCT 28.8* 26.0*  PLT 329 340   BMET:  Recent Labs    10/07/23 0513 10/08/23 0457  NA 135 135  K 3.7 3.7  CL 97* 98  CO2 28 26  GLUCOSE 173* 183*  BUN 32* 49*  CREATININE 2.91* 3.49*  CALCIUM  8.1* 7.9*    PT/INR: No results for input(s): LABPROT, INR in the last 72 hours. ABG    Component Value Date/Time   PHART 7.336 (L) 09/28/2023 0854   HCO3 20.5 09/28/2023 0854   TCO2 22 09/28/2023 0854   ACIDBASEDEF 5.0 (H) 09/28/2023 0854   O2SAT 86.1 10/08/2023 0457   CBG (last 3)  Recent Labs    10/07/23 1629 10/07/23 2128 10/08/23 0751  GLUCAP 160* 256* 210*    Assessment/Plan: S/P Procedure(s) (LRB): CORONARY  ARTERY BYPASS GRAFTING (CABG) TIMES FOUR UTILIZING THE LEFT INTERNAL MAMMARY ARTERY AND ENDOSCOPIC VEIN HARVEST RIGHT GREATER SAPHENOUS VEIN (N/A) ECHOCARDIOGRAM, TRANSESOPHAGEAL, INTRAOPERATIVE (N/A)  POD 11 Hemodynamically stable in sinus rhythm.  AKI on CKD stage lll. Nephrology planning to observe for now with next HD possible Tuesday. Making some urine.   Glucose is still over 200 at times. Will add Novolog  meal coverage to SSI and SEMGLEE .  Continue IS, ambulation. CIR has seen him.     LOS: 17 days    [name] 10/08/2023

## 2023-10-09 ENCOUNTER — Ambulatory Visit: Admitting: General Practice

## 2023-10-09 DIAGNOSIS — I5021 Acute systolic (congestive) heart failure: Secondary | ICD-10-CM | POA: Diagnosis not present

## 2023-10-09 DIAGNOSIS — D649 Anemia, unspecified: Secondary | ICD-10-CM

## 2023-10-09 DIAGNOSIS — R570 Cardiogenic shock: Secondary | ICD-10-CM | POA: Diagnosis not present

## 2023-10-09 LAB — RENAL FUNCTION PANEL
Albumin: 2.1 g/dL — ABNORMAL LOW (ref 3.5–5.0)
Anion gap: 11 (ref 5–15)
BUN: 53 mg/dL — ABNORMAL HIGH (ref 8–23)
CO2: 27 mmol/L (ref 22–32)
Calcium: 8.4 mg/dL — ABNORMAL LOW (ref 8.9–10.3)
Chloride: 101 mmol/L (ref 98–111)
Creatinine, Ser: 2.89 mg/dL — ABNORMAL HIGH (ref 0.61–1.24)
GFR, Estimated: 22 mL/min — ABNORMAL LOW (ref >60)
Glucose, Bld: 224 mg/dL — ABNORMAL HIGH (ref 70–99)
Phosphorus: 4.2 mg/dL (ref 2.5–4.6)
Potassium: 3.7 mmol/L (ref 3.5–5.1)
Sodium: 139 mmol/L (ref 135–145)

## 2023-10-09 LAB — COOXEMETRY PANEL
Carboxyhemoglobin: 2.2 % — ABNORMAL HIGH (ref 0.5–1.5)
Methemoglobin: <0.7 % (ref 0.0–1.5)
O2 Saturation: 65.1 %
Total hemoglobin: 8.8 g/dL — ABNORMAL LOW (ref 12.0–16.0)

## 2023-10-09 LAB — GLUCOSE, CAPILLARY
Glucose-Capillary: 279 mg/dL — ABNORMAL HIGH (ref 70–99)
Glucose-Capillary: 247 mg/dL — ABNORMAL HIGH (ref 70–99)
Glucose-Capillary: 128 mg/dL — ABNORMAL HIGH (ref 70–99)
Glucose-Capillary: 209 mg/dL — ABNORMAL HIGH (ref 70–99)

## 2023-10-09 LAB — MAGNESIUM: Magnesium: 2.4 mg/dL (ref 1.7–2.4)

## 2023-10-09 MED ORDER — FUROSEMIDE 10 MG/ML IJ SOLN
80.0000 mg | Freq: Once | INTRAMUSCULAR | Status: AC
  Administered 2023-10-09: 80 mg via INTRAVENOUS
  Filled 2023-10-09: qty 8

## 2023-10-09 MED ORDER — INSULIN ASPART 100 UNIT/ML IJ SOLN
5.0000 [IU] | Freq: Three times a day (TID) | INTRAMUSCULAR | Status: DC
  Administered 2023-10-09 – 2023-10-13 (×12): 5 [IU] via SUBCUTANEOUS

## 2023-10-09 MED ORDER — ORAL CARE MOUTH RINSE: 15.0000 mL | OROMUCOSAL | Status: DC | PRN

## 2023-10-09 MED ORDER — POTASSIUM CHLORIDE CRYS ER 20 MEQ PO TBCR
20.0000 meq | EXTENDED_RELEASE_TABLET | Freq: Once | ORAL | Status: AC
  Administered 2023-10-09: 20 meq via ORAL
  Filled 2023-10-09: qty 1

## 2023-10-09 NOTE — Progress Notes (Signed)
 Patient ID: [name], male   DOB: 1951/02/26, 73 y.o.   MRN: [id number] [facility] Progress Note   Assessment/ Plan:    1.  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 (BL Cr 1.3 - 1.6).  Developed nonoliguric postoperative acute kidney injury likely from ischemic ATN given difficulties of weaning from cardiopulmonary bypass.  Started on CRRT on 6/7 for management of volume status (fixed urine output on high-dose furosemide ) and azotemia with mental status improving after CRRT per charting  ------------------ - Seems to be improving.  May be able to continue without dialysis.  Will assess daily. - has a nontunneled catheter.  Consider removal of dialysis catheter or change to tunneled dialysis catheter pending his progress  2.  NSTEMI: With multivessel coronary artery disease and underwent four-vessel CABG on 09/27/2023.  Presented 09/21/23.  Postoperative course significant for difficulty with getting him off cardiopulmonary bypass and developed postoperative shock with pressor/inotrope support. - post-operative care per CT surgery   3.  Postoperative shock: Previously on norepinephrine , epinephrine  (now off)  - s/p milrinone  per CHF team  - now off  4.  Normocytic Anemia: Likely associated with surgical/postoperative blood loss.  - PRBC's per primary team  - Defer ESA for now with recent NSTEMI but anticipate starting when acceptable perhaps as early as next week if needed - trend labs  5. CKD stage 3a - note baseline Cr 1.3 - 1.6   Disposition - continue inpatient monitoring (dialysis dependent AKI)    Subjective:   Urine output increasing significantly 1.2 L yesterday.  Cardiology ordered some Lasix  today.  Creatinine also seem to improve on its own.  Patient feels well with no complaints.    Objective:   BP (!) 141/69   Pulse 84   Temp 98.3 F (36.8 C) (Oral)   Resp 17   Ht 5'  4 (1.626 m)   Wt 92.5 kg   SpO2 98%   BMI 35.00 kg/m   Intake/Output Summary (Last 24 hours) at 10/09/2023 1052 Last data filed at 10/09/2023 0700 Gross per 24 hour  Intake 360 ml  Output 950 ml  Net -590 ml   Weight change: -0.8 kg  Physical Exam:      GEN: wdwn, sitting in bed, nad ENT: no nasal discharge, mmm EYES: no scleral icterus, eomi CV: normal rate, no murmurs PULM: no iwob, bilateral chest rise ABD: NABS, non-distended SKIN: no rashes or jaundice EXT: no edema, warm and well perfused      Imaging: No results found.    Labs: BMET Recent Labs  Lab 10/04/23 0531 10/04/23 1606 10/05/23 0400 10/06/23 0533 10/07/23 0513 10/08/23 0457 10/09/23 0436  NA 135 136 136 139 135 135 139  K 3.7 4.1 3.7 3.9 3.7 3.7 3.7  CL 102 100 102 103 97* 98 101  CO2 24 25 25 25 28 26 27   GLUCOSE 124* 138* 93 83 173* 183* 224*  BUN 17 18 25* 45* 32* 49* 53*  CREATININE 1.90* 1.82* 2.96* 3.56* 2.91* 3.49* 2.89*  CALCIUM  8.3* 8.4* 8.2* 8.5* 8.1* 7.9* 8.4*  PHOS 2.2* 2.9 3.2 4.3 3.4 4.4 4.2   CBC Recent Labs  Lab 10/03/23 0500 10/04/23 0531 10/06/23 0533 10/08/23 0457  WBC 12.3* 12.6* 12.9* 12.6*  HGB 10.1* 9.8* 9.0* 8.1*  HCT 31.9* 31.3* 28.8* 26.0*  MCV 89.6 89.7 90.3 90.3  PLT 195 223 329 340    Medications:     (  feeding supplement) PROSource Plus  30 mL Oral TID WC   alteplase   2 mg Intracatheter Once   aspirin  EC  81 mg Oral Daily   atorvastatin   80 mg Oral Daily   bisacodyl   10 mg Oral Daily   Or   bisacodyl   10 mg Rectal Daily   carvedilol   3.125 mg Oral BID WC   Chlorhexidine  Gluconate Cloth  6 each Topical Q0600   clopidogrel   75 mg Oral Daily   docusate sodium   200 mg Oral Daily   feeding supplement  1 Container Oral TID BM   furosemide   80 mg Intravenous Once   heparin  injection (subcutaneous)  5,000 Units Subcutaneous Q8H   insulin  aspart  0-9 Units Subcutaneous TID WC   insulin  aspart  5 Units Subcutaneous TID WC   insulin  glargine-yfgn   10 Units Subcutaneous Daily   multivitamin  1 tablet Oral QHS   potassium chloride   20 mEq Oral Once   traZODone   50 mg Oral QHS    Levorn Reason, MD 10/09/2023, 10:52 AM

## 2023-10-09 NOTE — Progress Notes (Signed)
 Physical Therapy Treatment Patient Details Name: [name] Treat MRN: [id number] DOB: 06-12-1950 Today's Date: 10/09/2023   History of Present Illness 73 y.o. male presenting 09/21/23 with chest pain and elevated troponins. NSTEMI; 5/29 L heart cath with multivessel CAD, 6/04 CABGx4; 6/5 extubated; post-CABG cardioplegia requiring inotropic support; decr renal function with uremic encephalopathy, CRRT 6/7-6/11 PMH: DM, MI, HTN    PT Comments  Patient seen for continued education in sternal precautions, especially as related to mobility. Patient with improved understanding of precautions and required min cues to adhere to precautions. Progressed ambulation distance with RW with cues for attending to task (tends to internally distract himself and then drift rt or left with running into object x 1). Patient has made good progress and updated discharge plan to HHPT. Case Manager discussed possible rollator for home use (especially for community use)--will need to assess pt's safety with rollator next session.    If plan is discharge home, recommend the following: Assistance with cooking/housework;Assist for transportation;Help with stairs or ramp for entrance;Supervision due to cognitive status;Direct supervision/assist for financial management;Direct supervision/assist for medications management;A little help with walking and/or transfers   Can travel by private vehicle        Equipment Recommendations  Rolling walker (2 wheels)    Recommendations for Other Services       Precautions / Restrictions Precautions Precautions: Fall;Sternal Precaution Booklet Issued: No Recall of Precautions/Restrictions: Impaired Precaution/Restrictions Comments: able to state no pushing or pulling; cues for staying in the tube     Mobility  Bed Mobility Overal bed mobility: Needs Assistance Bed Mobility: Rolling, Sidelying to Sit Rolling: Min assist Sidelying to sit: Min assist       General bed mobility  comments: cues for technique, assist to roll and raise trunk, assist for hips to EOB    Transfers Overall transfer level: Needs assistance Equipment used: Rolling walker (2 wheels) Transfers: Sit to/from Stand Sit to Stand: Min assist, Contact guard assist           General transfer comment: cues for hands on knees and use of momentum; with repetition no cues or momentum needed    Ambulation/Gait Ambulation/Gait assistance: Contact guard assist Gait Distance (Feet): 280 Feet Assistive device: Rolling walker (2 wheels) Gait Pattern/deviations: Step-through pattern, Decreased stride length, Shuffle, Drifts right/left Gait velocity: decr     General Gait Details: Tends to drift right/left when externally distracted. Poor foot clearance with shortened steps. Cues to increase stride length.   Stairs             Wheelchair Mobility     Tilt Bed    Modified Rankin (Stroke Patients Only)       Balance Overall balance assessment: Needs assistance Sitting-balance support: No upper extremity supported, Feet supported Sitting balance-Leahy Scale: Fair Sitting balance - Comments: no LOB with minimal challenge at EOB   Standing balance support: During functional activity, Bilateral upper extremity supported, Reliant on assistive device for balance Standing balance-Leahy Scale: Poor                              Communication Communication Communication: Impaired Factors Affecting Communication: Hearing impaired  Cognition Arousal: Alert Behavior During Therapy: WFL for tasks assessed/performed   PT - Cognitive impairments: Memory, Safety/Judgement, Attention                       PT - Cognition Comments: Min cues to adhere to  sternal precautions throughout session (improved from max cues). Tends to get distracted by external stimuli requiring cues to adhere to task; decr awareness of drifting while walking with running into object on his right x  1 Following commands: Intact      Cueing Cueing Techniques: Verbal cues  Exercises Other Exercises Other Exercises: sit to stand x 5 reps from recliner; emphasis on ant wt-shift, proper hand placment, and transition away from using momentum    General Comments General comments (skin integrity, edema, etc.): HR 85-96      Pertinent Vitals/Pain Pain Assessment Pain Assessment: No/denies pain    Home Living                          Prior Function            PT Goals (current goals can now be found in the care plan section) Acute Rehab PT Goals Patient Stated Goal: agrees he wants to get moving PT Goal Formulation: With patient Time For Goal Achievement: 10/15/23 Potential to Achieve Goals: Good Progress towards PT goals: Progressing toward goals    Frequency    Min 2X/week      PT Plan      Co-evaluation              AM-PAC PT 6 Clicks Mobility   Outcome Measure  Help needed turning from your back to your side while in a flat bed without using bedrails?: A Little Help needed moving from lying on your back to sitting on the side of a flat bed without using bedrails?: A Little Help needed moving to and from a bed to a chair (including a wheelchair)?: A Little Help needed standing up from a chair using your arms (e.g., wheelchair or bedside chair)?: A Little Help needed to walk in hospital room?: A Little Help needed climbing 3-5 steps with a railing? : A Little 6 Click Score: 18    End of Session Equipment Utilized During Treatment: Gait belt Activity Tolerance: Patient tolerated treatment well Patient left: in chair;with call bell/phone within reach Nurse Communication: Mobility status PT Visit Diagnosis: Unsteadiness on feet (R26.81);Muscle weakness (generalized) (M62.81);Difficulty in walking, not elsewhere classified (R26.2)     Time: 4098-1191 PT Time Calculation (min) (ACUTE ONLY): 32 min  Charges:    $Gait Training: 23-37  mins PT General Charges $$ ACUTE PT VISIT: 1 Visit                      Gayle Kava, PT Acute Rehabilitation Services  Office [phone]    Guilford Leep 10/09/2023, 12:20 PM

## 2023-10-09 NOTE — Progress Notes (Signed)
 Patient brought to 4E from 2H. VSS. Telemetry box applied, CCMD notified. Patient oriented to room and staff. Call bell in reach. ? ?Kenard Gower, RN  ?

## 2023-10-09 NOTE — Progress Notes (Signed)
 Inpatient Rehab Admissions Coordinator:   Therapy updating recs to [facility] f/u.  We will sign off for CIR at this time.   Loye Rumble, PT, DPT Admissions Coordinator [phone] 10/09/23  1:51 PM

## 2023-10-09 NOTE — Inpatient Diabetes Management (Signed)
 Inpatient Diabetes Program Recommendations  AACE/ADA: New Consensus Statement on Inpatient Glycemic Control (2015)  Target Ranges:  Prepandial:   less than 140 mg/dL      Peak postprandial:   less than 180 mg/dL (1-2 hours)      Critically ill patients:  140 - 180 mg/dL   Lab Results  Component Value Date   GLUCAP 279 (H) 10/09/2023   HGBA1C 11.3 (H) 09/22/2023    Latest Reference Range & Units 10/08/23 07:51 10/08/23 11:52 10/08/23 16:07 10/08/23 21:01 10/09/23 07:50  Glucose-Capillary 70 - 99 mg/dL 440 (H) 102 (H) 725 (H) 299 (H) 279 (H)  (H): Data is abnormally high  Diabetes history: DM2 Outpatient Diabetes medications:  Basaglar  65 units every day Fiasp  40 units with dinner Metformin  500 mg BID Freestyle Libre 3 CGM Current orders for Inpatient glycemic control: Semglee  10 units daily Novolog  5 units tid meal coverage Novolog  0-9 units tid correction  Inpatient Diabetes Program Recommendations:    Fasting 279.Please consider: -Increase Semglee  to 12 units daily.  Thank you, Jeanie Mccard E. Canuto Kingston, RN, MSN, CDCES  Diabetes Coordinator Inpatient Glycemic Control Team Team Pager [phone] (8am-5pm) 10/09/2023 9:37 AM

## 2023-10-09 NOTE — Progress Notes (Addendum)
 [address]       [place] [address]             [phone]      12 Days Post-Op Procedure(s) (LRB): CORONARY ARTERY BYPASS GRAFTING (CABG) TIMES FOUR UTILIZING THE LEFT INTERNAL MAMMARY ARTERY AND ENDOSCOPIC VEIN HARVEST RIGHT GREATER SAPHENOUS VEIN (N/A) ECHOCARDIOGRAM, TRANSESOPHAGEAL, INTRAOPERATIVE (N/A) Subjective: The patient states he is ready to go dancing this AM, no new complaints.   Objective: Vital signs in last 24 hours: Temp:  [97.5 F (36.4 C)-98.7 F (37.1 C)] 98.3 F (36.8 C) (06/16 0400) Pulse Rate:  [74-98] 98 (06/16 0700) Cardiac Rhythm: Normal sinus rhythm (06/15 2208) Resp:  [12-25] 20 (06/16 0700) BP: (96-147)/(54-114) 135/81 (06/16 0700) SpO2:  [89 %-100 %] 98 % (06/16 0700) Weight:  [92.5 kg] 92.5 kg (06/16 0446)  Hemodynamic parameters for last 24 hours: CVP:  [3 mmHg-69 mmHg] 3 mmHg  Intake/Output from previous day: 06/15 0701 - 06/16 0700 In: 600 [P.O.:600] Out: 1150 [Urine:1150] Intake/Output this shift: No intake/output data recorded.  General appearance: alert, cooperative, and no distress Neurologic: intact Heart: regular rate and rhythm, S1, S2 normal, no murmur, click, rub or gallop Lungs: slightly diminished bibasilar breath sounds Abdomen: soft, non-tender; bowel sounds normal; no masses,  no organomegaly Extremities: edema trace BLE Wound: Clean and dry without sign of infection  Lab Results: Recent Labs    10/08/23 0457  WBC 12.6*  HGB 8.1*  HCT 26.0*  PLT 340   BMET:  Recent Labs    10/08/23 0457 10/09/23 0436  NA 135 139  K 3.7 3.7  CL 98 101  CO2 26 27  GLUCOSE 183* 224*  BUN 49* 53*  CREATININE 3.49* 2.89*  CALCIUM  7.9* 8.4*    PT/INR: No results for input(s): LABPROT, INR in the last 72 hours. ABG    Component Value Date/Time   PHART 7.336 (L) 09/28/2023 0854   HCO3 20.5 09/28/2023 0854   TCO2 22 09/28/2023 0854   ACIDBASEDEF 5.0 (H) 09/28/2023 0854   O2SAT 65.1  10/09/2023 0436   CBG (last 3)  Recent Labs    10/08/23 1152 10/08/23 1607 10/08/23 2101  GLUCAP 222* 187* 299*    Assessment/Plan: S/P Procedure(s) (LRB): CORONARY ARTERY BYPASS GRAFTING (CABG) TIMES FOUR UTILIZING THE LEFT INTERNAL MAMMARY ARTERY AND ENDOSCOPIC VEIN HARVEST RIGHT GREATER SAPHENOUS VEIN (N/A) ECHOCARDIOGRAM, TRANSESOPHAGEAL, INTRAOPERATIVE (N/A)  CV: Hx of NSTEMI, on Plavix . HFrEF, on Coreg  3.125. Off milrinone , coox 65.1. AHF assisting with titration of GDMT as able but restricted with renal failure. NSR, HR 80s-90s. SBP 105-140s.   Pulm: Saturating well on RA this AM. Last CXR with bibasilar atelectasis and small left pleural effusion. Encourage IS, flutter valve and ambulation.   GI: 3 Bms between yesterday and today. Tolerating a diet. No nausea/vomiting.   Endo: uncontrolled T2DM, preop A1C 11.3. CBGs up to 299 on Semglee  10U daily, Novolog  3U TID with meals and SSI. Will increase meal coverage to 5U TID.  Renal: Preoperative CKD stage 3 baseline cr 1.3-1.6. Now with renal failure, off CRRT. Nephrology following, anticipating he may need iHD tomorrow 06/17 per nephrology last note. May need tunneled dialysis catheter if he continues to need dialysis. Cr 2.89, down from 3.49. Improved UO 1150cc/24hrs.   ID: Likely reactive leukocytosis, last WBC 12.6. Afebrile. Clinically monitor.  Expected postop ABLA: Last H/H 8.1/26, no clinical signs of bleeding. Not clinically significant at this time. Nephrology to possibly start ESA if needed  this week.   DVT Prophylaxis: On heparin  injection 5000U Q8H.   Deconditioning: CIR following, continue work with PT/OT  Dispo: Plan to transfer to 4E today   LOS: 18 days    Angela Barban, PA-C 10/09/2023  Patient seen and examined, looks great Creatinine down this AM- hopefully kidneys recovering Transfer to 4E  Landon Pinion C. [name], MD [facility] [phone]

## 2023-10-09 NOTE — Plan of Care (Signed)
  Problem: Coping: Goal: Ability to adjust to condition or change in health will improve Outcome: Progressing   Problem: Fluid Volume: Goal: Ability to maintain a balanced intake and output will improve Outcome: Progressing   Problem: Health Behavior/Discharge Planning: Goal: Ability to manage health-related needs will improve Outcome: Progressing   Problem: Nutritional: Goal: Maintenance of adequate nutrition will improve Outcome: Progressing Goal: Progress toward achieving an optimal weight will improve Outcome: Progressing   Problem: Skin Integrity: Goal: Risk for impaired skin integrity will decrease Outcome: Progressing

## 2023-10-09 NOTE — Progress Notes (Signed)
 Patient ID: [name], male   DOB: [birth year], 73 y.o.   MRN: [id number]     Advanced Heart Failure Rounding Note  Cardiologist: Hazle Lites, MD  Chief Complaint: Post cardiotomy shock Patient Profile   73 y.o. male with history of CAD, HFmrEF, uncontrolled DM II, HTN, HLD. Admitted with NSTEMI and acute on chronic CHF. Underwent CABG X 4 on 09/27/23 c/b postcardiotomy shock.  Subjective:    06/04: CABG X 4 with Dr. [name]. Difficulty separating from cardiopulmonary bypass 06/05: Extubated   Remains off milrinone , no iHD today as he has been making urine and no indications of dialysis. Will augment with 80mg  IV lasix  today. Able to transfer to the floor, HF will continue to follow.   Objective:    Weight Range: 92.5 kg Body mass index is 35 kg/m.   Vital Signs:   Temp:  [97.5 F (36.4 C)-98.7 F (37.1 C)] 98.3 F (36.8 C) (06/16 0400) Pulse Rate:  [74-98] 84 (06/16 1000) Resp:  [12-25] 17 (06/16 1000) BP: (105-147)/(55-114) 141/69 (06/16 1000) SpO2:  [89 %-100 %] 98 % (06/16 1000) Weight:  [92.5 kg] 92.5 kg (06/16 0446) Last BM Date : 10/08/23  Weight change: Filed Weights   10/06/23 1814 10/08/23 0500 10/09/23 0446  Weight: 92.5 kg 93.3 kg 92.5 kg   Intake/Output:  Intake/Output Summary (Last 24 hours) at 10/09/2023 1040 Last data filed at 10/09/2023 0700 Gross per 24 hour  Intake 360 ml  Output 950 ml  Net -590 ml    Physical Exam   GENERAL: NAD, chronically ill appearing PULM:  Normal work of breathing, CTAB CARDIAC:  JVP: mildly elevated         Normal rate with regular rhythm. No murmurs, rubs or gallops.  No edema. Warm and well perfused extremities. ABDOMEN: Soft, non-tender, non-distended. NEUROLOGIC: Patient is oriented x3 with no focal or lateralizing neurologic deficits.     Telemetry: sinus 80-90s Personally reviewed  Labs    CBC Recent Labs    10/08/23 0457  WBC 12.6*  HGB 8.1*  HCT 26.0*  MCV 90.3  PLT 340   Basic  Metabolic Panel Recent Labs    40/98/11 0457 10/09/23 0436  NA 135 139  K 3.7 3.7  CL 98 101  CO2 26 27  GLUCOSE 183* 224*  BUN 49* 53*  CREATININE 3.49* 2.89*  CALCIUM  7.9* 8.4*  MG 2.4 2.4  PHOS 4.4 4.2   Liver Function Tests Recent Labs    10/08/23 0457 10/09/23 0436  ALBUMIN  2.1* 2.1*   BNP (last 3 results) Recent Labs    09/21/23 0806  BNP 128.5*   Medications:    Scheduled Medications:  (feeding supplement) PROSource Plus  30 mL Oral TID WC   alteplase   2 mg Intracatheter Once   aspirin  EC  81 mg Oral Daily   atorvastatin   80 mg Oral Daily   bisacodyl   10 mg Oral Daily   Or   bisacodyl   10 mg Rectal Daily   carvedilol   3.125 mg Oral BID WC   Chlorhexidine  Gluconate Cloth  6 each Topical Q0600   clopidogrel   75 mg Oral Daily   docusate sodium   200 mg Oral Daily   feeding supplement  1 Container Oral TID BM   furosemide   80 mg Intravenous Once   heparin  injection (subcutaneous)  5,000 Units Subcutaneous Q8H   insulin  aspart  0-9 Units Subcutaneous TID WC   insulin  aspart  5 Units Subcutaneous TID WC   insulin   glargine-yfgn  10 Units Subcutaneous Daily   multivitamin  1 tablet Oral QHS   traZODone   50 mg Oral QHS   Infusions:  albumin  human     anticoagulant sodium citrate      PRN Medications: alteplase , anticoagulant sodium citrate, dextrose , heparin , levalbuterol , lidocaine  (PF), lidocaine -prilocaine, ondansetron  (ZOFRAN ) IV, mouth rinse, oxyCODONE , pentafluoroprop-tetrafluoroeth, phenol, traMADol   Assessment/Plan   1. Acute systolic CHF/Cardiogenic shock: Ischemic cardiomyopathy. Echo pre-CABG with EF 35-40%, RV hard to visualize. Difficulty separating from cardiopulmonary bypass following CABG 09/27/23. Placed on low dose milrinone  0.125 for RV support, now off.  - Echo 09/30/23 EF 25-30%  - co-ox stable off milrinone  - Holding iHD today, urine output improving - continue carvedilol  - If BP still elevated tomorrow will start half dose bidil,  allowing for renal recovery  2. CAD: NSTEMI. Hx PCI to LAD in 2005.  NSTEMI 09/21/23 with multivessel disease on LHC. S/p CABG X 4 (LIMA to LAD, SVG to OM1, sequential to OM3, SVG to PDA) on 09/27/23 - No s/s angina - Continue ASA + plavix  75 mg daily for MI - Continue atorva 80. LDL 121 (not adherent with home meds prior to admit)  3. Acute Renal Failure: CKD 3 at baseline. Admission creatinine was 1.9. Creatinine peaked above 4, worsening uremic symptoms. Started on CRRT for clearance and volume removal. - Some renal recovery. 80mg  IV lasix  x1 today, nephrology following  4. Anemia: Post-op, relatively stable  5. DM II: Uncontrolled, A1c 11.3% - Insulin  per TCTS  6. Physical deconditioning - Struggling with balance - CIR following  Ok for 4E from our standpoint .  Length of Stay: 68   Lauralee Poll, MD 10/09/2023, 10:40 AM  Advanced Heart Failure Team Pager [phone] (M-F; 7a - 5p)

## 2023-10-10 ENCOUNTER — Telehealth: Payer: Self-pay | Admitting: *Deleted

## 2023-10-10 ENCOUNTER — Telehealth (HOSPITAL_COMMUNITY): Payer: Self-pay | Admitting: Licensed Clinical Social Worker

## 2023-10-10 ENCOUNTER — Other Ambulatory Visit: Payer: Self-pay | Admitting: *Deleted

## 2023-10-10 ENCOUNTER — Inpatient Hospital Stay (HOSPITAL_COMMUNITY)

## 2023-10-10 DIAGNOSIS — I5022 Chronic systolic (congestive) heart failure: Secondary | ICD-10-CM

## 2023-10-10 DIAGNOSIS — I214 Non-ST elevation (NSTEMI) myocardial infarction: Secondary | ICD-10-CM | POA: Diagnosis not present

## 2023-10-10 LAB — RENAL FUNCTION PANEL
Albumin: 2.2 g/dL — ABNORMAL LOW (ref 3.5–5.0)
Anion gap: 12 (ref 5–15)
BUN: 59 mg/dL — ABNORMAL HIGH (ref 8–23)
CO2: 27 mmol/L (ref 22–32)
Calcium: 8.7 mg/dL — ABNORMAL LOW (ref 8.9–10.3)
Chloride: 100 mmol/L (ref 98–111)
Creatinine, Ser: 2.76 mg/dL — ABNORMAL HIGH (ref 0.61–1.24)
GFR, Estimated: 24 mL/min — ABNORMAL LOW (ref >60)
Glucose, Bld: 159 mg/dL — ABNORMAL HIGH (ref 70–99)
Phosphorus: 4.3 mg/dL (ref 2.5–4.6)
Potassium: 3.9 mmol/L (ref 3.5–5.1)
Sodium: 139 mmol/L (ref 135–145)

## 2023-10-10 LAB — COOXEMETRY PANEL
Carboxyhemoglobin: 1.9 % — ABNORMAL HIGH (ref 0.5–1.5)
Methemoglobin: 0.8 % (ref 0.0–1.5)
O2 Saturation: 60.4 %
Total hemoglobin: 9.1 g/dL — ABNORMAL LOW (ref 12.0–16.0)

## 2023-10-10 LAB — GLUCOSE, CAPILLARY
Glucose-Capillary: 165 mg/dL — ABNORMAL HIGH (ref 70–99)
Glucose-Capillary: 169 mg/dL — ABNORMAL HIGH (ref 70–99)
Glucose-Capillary: 200 mg/dL — ABNORMAL HIGH (ref 70–99)
Glucose-Capillary: 205 mg/dL — ABNORMAL HIGH (ref 70–99)
Glucose-Capillary: 239 mg/dL — ABNORMAL HIGH (ref 70–99)

## 2023-10-10 LAB — CBC
HCT: 27.4 % — ABNORMAL LOW (ref 39.0–52.0)
Hemoglobin: 8.5 g/dL — ABNORMAL LOW (ref 13.0–17.0)
MCH: 28.1 pg (ref 26.0–34.0)
MCHC: 31 g/dL (ref 30.0–36.0)
MCV: 90.7 fL (ref 80.0–100.0)
Platelets: 431 10*3/uL — ABNORMAL HIGH (ref 150–400)
RBC: 3.02 MIL/uL — ABNORMAL LOW (ref 4.22–5.81)
RDW: 14.3 % (ref 11.5–15.5)
WBC: 13.4 10*3/uL — ABNORMAL HIGH (ref 4.0–10.5)
nRBC: 0 % (ref 0.0–0.2)

## 2023-10-10 LAB — BASIC METABOLIC PANEL WITH GFR
Anion gap: 13 (ref 5–15)
BUN: 59 mg/dL — ABNORMAL HIGH (ref 8–23)
CO2: 27 mmol/L (ref 22–32)
Calcium: 8.7 mg/dL — ABNORMAL LOW (ref 8.9–10.3)
Chloride: 101 mmol/L (ref 98–111)
Creatinine, Ser: 2.76 mg/dL — ABNORMAL HIGH (ref 0.61–1.24)
GFR, Estimated: 24 mL/min — ABNORMAL LOW (ref >60)
Glucose, Bld: 162 mg/dL — ABNORMAL HIGH (ref 70–99)
Potassium: 3.9 mmol/L (ref 3.5–5.1)
Sodium: 141 mmol/L (ref 135–145)

## 2023-10-10 LAB — MAGNESIUM: Magnesium: 2.2 mg/dL (ref 1.7–2.4)

## 2023-10-10 MED ORDER — ISOSORB DINITRATE-HYDRALAZINE 20-37.5 MG PO TABS
0.5000 | ORAL_TABLET | Freq: Three times a day (TID) | ORAL | Status: DC
  Administered 2023-10-10 – 2023-10-11 (×6): 0.5 via ORAL
  Filled 2023-10-10 (×7): qty 1

## 2023-10-10 NOTE — Progress Notes (Signed)
 Patient ID: [name], male   DOB: January 13, 1951, 73 y.o.   MRN: [id number] [facility] Progress Note   Assessment/ Plan:    1.  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 (BL Cr 1.3 - 1.6).  Developed nonoliguric postoperative acute kidney injury likely from ischemic ATN given difficulties of weaning from cardiopulmonary bypass.  Started on CRRT on 6/7 for management of volume status (fixed urine output on high-dose furosemide ) and azotemia with mental status improving after CRRT per charting  ------------------ - Seems to be improving.  Will recommend removal of dialysis catheter at this time.  If creatinine continues to improve tomorrow we may sign off  2.  NSTEMI: With multivessel coronary artery disease and underwent four-vessel CABG on 09/27/2023.  Presented 09/21/23.  Postoperative course significant for difficulty with getting him off cardiopulmonary bypass and developed postoperative shock with pressor/inotrope support. - post-operative care per CT surgery   3.  Postoperative shock: Previously on norepinephrine , epinephrine  (now off)  - s/p milrinone  per CHF team  - now off  4.  Normocytic Anemia: Likely associated with surgical/postoperative blood loss.  - PRBC's per primary team  - trend labs  5. CKD stage 3a - note baseline Cr 1.3 - 1.6   Disposition - continue inpatient monitoring; improving    Subjective:   Patient feels well today with no complaints.  Feels like he is urinating well.  Good appetite.  Creatinine slightly down to 2.8    Objective:   BP 136/70 (BP Location: Left Arm)   Pulse 86   Temp (!) 96 F (35.6 C) (Axillary)   Resp 16   Ht 5' 4 (1.626 m)   Wt 89.4 kg   SpO2 95%   BMI 33.83 kg/m   Intake/Output Summary (Last 24 hours) at 10/10/2023 1042 Last data filed at 10/10/2023 0500 Gross per 24 hour  Intake 240 ml  Output 900 ml  Net -660 ml   Weight  change: -3.1 kg  Physical Exam:      GEN: wdwn, sitting in bed, nad ENT: no nasal discharge, mmm EYES: no scleral icterus, eomi CV: normal rate, no murmurs PULM: no iwob, bilateral chest rise ABD: NABS, non-distended SKIN: no rashes or jaundice EXT: no edema, warm and well perfused      Imaging: DG Chest 2 View Result Date: 10/10/2023 CLINICAL DATA:  Status post CABG. EXAM: CHEST - 2 VIEW COMPARISON:  10/03/2023 FINDINGS: Low volume film. The cardio pericardial silhouette is enlarged. Minimal atelectasis in the left mid lung and at the left base with tiny left pleural effusion. Left IJ central line tip overlies the innominate vein confluence. No acute bony abnormality. IMPRESSION: Low volume film with minimal atelectasis in the left mid lung and at the left base. Tiny left pleural effusion. Electronically Signed   By: Donnal Fusi M.D.   On: 10/10/2023 07:40      Labs: BMET Recent Labs  Lab 10/04/23 1606 10/05/23 0400 10/06/23 0533 10/07/23 0513 10/08/23 0457 10/09/23 0436 10/10/23 0819  NA 136 136 139 135 135 139 141  139  K 4.1 3.7 3.9 3.7 3.7 3.7 3.9  3.9  CL 100 102 103 97* 98 101 101  100  CO2 25 25 25 28 26 27 27  27   GLUCOSE 138* 93 83 173* 183* 224* 162*  159*  BUN 18 25* 45* 32* 49* 53* 59*  59*  CREATININE 1.82* 2.96* 3.56*  2.91* 3.49* 2.89* 2.76*  2.76*  CALCIUM  8.4* 8.2* 8.5* 8.1* 7.9* 8.4* 8.7*  8.7*  PHOS 2.9 3.2 4.3 3.4 4.4 4.2 4.3   CBC Recent Labs  Lab 10/04/23 0531 10/06/23 0533 10/08/23 0457 10/10/23 0819  WBC 12.6* 12.9* 12.6* 13.4*  HGB 9.8* 9.0* 8.1* 8.5*  HCT 31.3* 28.8* 26.0* 27.4*  MCV 89.7 90.3 90.3 90.7  PLT 223 329 340 431*    Medications:     (feeding supplement) PROSource Plus  30 mL Oral TID WC   alteplase   2 mg Intracatheter Once   aspirin  EC  81 mg Oral Daily   atorvastatin   80 mg Oral Daily   bisacodyl   10 mg Oral Daily   Or   bisacodyl   10 mg Rectal Daily   carvedilol   3.125 mg Oral BID WC   Chlorhexidine   Gluconate Cloth  6 each Topical Q0600   clopidogrel   75 mg Oral Daily   docusate sodium   200 mg Oral Daily   feeding supplement  1 Container Oral TID BM   heparin  injection (subcutaneous)  5,000 Units Subcutaneous Q8H   insulin  aspart  0-9 Units Subcutaneous TID WC   insulin  aspart  5 Units Subcutaneous TID WC   insulin  glargine-yfgn  10 Units Subcutaneous Daily   isosorbide-hydrALAZINE   0.5 tablet Oral TID   multivitamin  1 tablet Oral QHS   traZODone   50 mg Oral QHS    Levorn Reason, MD 10/10/2023, 10:42 AM

## 2023-10-10 NOTE — Progress Notes (Signed)
 Mobility Specialist Progress Note:    10/10/23 1610  Mobility  Activity Transferred to/from [facility]  Level of Assistance Independent  Assistive Device None  Distance Ambulated (ft) 5 ft  RUE Weight Bearing Per Provider Order NWB  LUE Weight Bearing Per Provider Order NWB  Activity Response Tolerated well  Mobility Referral Yes  Mobility visit 1 Mobility  Mobility Specialist Start Time (ACUTE ONLY) 0915  Mobility Specialist Stop Time (ACUTE ONLY) 0920  Mobility Specialist Time Calculation (min) (ACUTE ONLY) 5 min   Pt received on BSC, requesting to transfer back to bed. Void successful, pt completed peri care independently. Tolerated well, pt lying comfortably in bed with all needs met. Declined further mobility at this time, will f/u if time permits.   Mehul Rudin Mobility Specialist Please contact via Special educational needs teacher or  Rehab office at [phone]

## 2023-10-10 NOTE — Progress Notes (Addendum)
 Patient ID: [name], male   DOB: 11/12/1950, 73 y.o.   MRN: [id number]     Advanced Heart Failure Rounding Note  Cardiologist: Hazle Lites, MD  Chief Complaint: Post cardiotomy shock Patient Profile   73 y.o. male with history of CAD, HFmrEF, uncontrolled DM II, HTN, HLD. Admitted with NSTEMI and acute on chronic CHF. Underwent CABG X 4 on 09/27/23 c/b postcardiotomy shock.  Subjective:    06/04: CABG X 4 with Dr. [name]. Difficulty separating from cardiopulmonary bypass 06/05: Extubated   POD #12  Volume appears stable. - UOP yesterday. Scr 2.91 -> 3.49 -> 2.89  Denies CP or SOB. PT in room trying to get him in chair.   Objective:    Weight Range: 89.4 kg Body mass index is 33.83 kg/m.   Vital Signs:   Temp:  [97.9 F (36.6 C)-98.8 F (37.1 C)] 98.2 F (36.8 C) (06/17 0258) Pulse Rate:  [83-120] 91 (06/17 0258) Resp:  [11-25] 17 (06/17 0258) BP: (106-151)/(60-101) 140/73 (06/17 0258) SpO2:  [93 %-100 %] 98 % (06/17 0258) Weight:  [89.4 kg] 89.4 kg (06/17 0258) Last BM Date : 10/09/23  Weight change: Filed Weights   10/08/23 0500 10/09/23 0446 10/10/23 0258  Weight: 93.3 kg 92.5 kg 89.4 kg   Intake/Output:  Intake/Output Summary (Last 24 hours) at 10/10/2023 0719 Last data filed at 10/10/2023 0500 Gross per 24 hour  Intake 480 ml  Output 900 ml  Net -420 ml    Physical Exam   General:  well appearing.  No respiratory difficulty. Resting in bed.  Neck: supple. JVD ~8 cm. LIJ THD Cor: PMI nondisplaced. Regular rate & rhythm. No rubs, gallops or murmurs. Lungs: clear Extremities: no cyanosis, clubbing, rash, edema  Neuro: alert & oriented x 3. Moves all 4 extremities w/o difficulty. Affect pleasant.   Telemetry: sinus 80s Personally reviewed  Labs    CBC Recent Labs    10/08/23 0457  WBC 12.6*  HGB 8.1*  HCT 26.0*  MCV 90.3  PLT 340   Basic Metabolic Panel Recent Labs    62/13/08 0457 10/09/23 0436  NA 135 139  K 3.7 3.7   CL 98 101  CO2 26 27  GLUCOSE 183* 224*  BUN 49* 53*  CREATININE 3.49* 2.89*  CALCIUM  7.9* 8.4*  MG 2.4 2.4  PHOS 4.4 4.2   Liver Function Tests Recent Labs    10/08/23 0457 10/09/23 0436  ALBUMIN  2.1* 2.1*   BNP (last 3 results) Recent Labs    09/21/23 0806  BNP 128.5*   Medications:    Scheduled Medications:  (feeding supplement) PROSource Plus  30 mL Oral TID WC   alteplase   2 mg Intracatheter Once   aspirin  EC  81 mg Oral Daily   atorvastatin   80 mg Oral Daily   bisacodyl   10 mg Oral Daily   Or   bisacodyl   10 mg Rectal Daily   carvedilol   3.125 mg Oral BID WC   Chlorhexidine  Gluconate Cloth  6 each Topical Q0600   clopidogrel   75 mg Oral Daily   docusate sodium   200 mg Oral Daily   feeding supplement  1 Container Oral TID BM   heparin  injection (subcutaneous)  5,000 Units Subcutaneous Q8H   insulin  aspart  0-9 Units Subcutaneous TID WC   insulin  aspart  5 Units Subcutaneous TID WC   insulin  glargine-yfgn  10 Units Subcutaneous Daily   multivitamin  1 tablet Oral QHS   traZODone   50 mg Oral  QHS   Infusions:  albumin  human     anticoagulant sodium citrate      PRN Medications: alteplase , anticoagulant sodium citrate, dextrose , heparin , levalbuterol , lidocaine  (PF), lidocaine -prilocaine, ondansetron  (ZOFRAN ) IV, mouth rinse, oxyCODONE , pentafluoroprop-tetrafluoroeth, phenol, traMADol   Assessment/Plan  1. Acute systolic CHF/Cardiogenic shock: Ischemic cardiomyopathy. Echo pre-CABG with EF 35-40%, RV hard to visualize. Difficulty separating from cardiopulmonary bypass following CABG 09/27/23. Placed on low dose milrinone  0.125 for RV support, now off.  - Echo 09/30/23 EF 25-30%  - co-ox stable off milrinone  - Renal following for possible iHD - continue carvedilol  - Not candidate for ARB/ARNI/MRA or SGLT2i yet with renal failure. Can add Bidil as tolerated but would not push BP too low as we await renal recovery.   2. CAD: NSTEMI. Hx PCI to LAD in 2005.   NSTEMI 09/21/23 with multivessel disease on LHC. S/p CABG X 4 (LIMA to LAD, SVG to OM1, sequential to OM3, SVG to PDA) on 09/27/23 - No s/s angina - Continue ASA + plavix  75 mg daily - Continue atorva 80. LDL 121 (not adherent with home meds prior to admit)  3. Acute Renal Failure: CKD 3 at baseline. Admission creatinine was 1.9. Creatinine peaked above 4, worsening uremic symptoms. Started on CRRT for clearance and volume removal. - Getting some renal recovery. Off CVVHD - Renal following for PRN iHD.   4. Anemia: Post-op, hgb stable at 8.1  5. DM II: Uncontrolled, A1c 11.3% - Insulin  per TCTS  6. Physical deconditioning - Struggling with balance - PT has now updated recs to [facility] PT. Will no longer require CIR at discharge.   SDOH:  History of noncompliance with medications. Will consult paramedicine at discharge.    Length of Stay: 19   Sheryl Donna, NP 10/10/2023, 7:19 AM  Advanced Heart Failure Team Pager [phone] (M-F; 7a - 5p)    Patient seen and examined with the above-signed Advanced Practice Provider and/or Housestaff. I personally reviewed laboratory data, imaging studies and relevant notes. I independently examined the patient and formulated the important aspects of the plan. I have edited the note to reflect any of my changes or salient points. I have personally discussed the plan with the patient and/or family.  Feeling good. No CP or SOB. Ambulating with PT.   Renal function seems to be recovering.   General: Sitting up  No resp difficulty HEENT: normal Neck: supple. JVP 7-8 LIJ HD cath Cor: PMI nondisplaced. Regular rate & rhythm. No rubs, gallops or murmurs. Lungs: clear Abdomen: soft, nontender, nondistended. No hepatosplenomegaly. No bruits or masses. Good bowel sounds. Extremities: no cyanosis, clubbing, rash, edema Neuro: alert & orientedx3, cranial nerves grossly intact. moves all 4 extremities w/o difficulty. Affect pleasant  Stable from HF perspective.  Continue carvedilol . Will add Bidil 1/2 bid.   Renal function seems to be recovering. Renal following for PRN HD.  Balance and mobility appear to be improving.   Jules Oar, MD  11:26 AM

## 2023-10-10 NOTE — TOC Transition Note (Signed)
 Transition of Care [facility]) - Discharge Note   Patient Details  Name: [name] MRN: [id number] Date of Birth: May 14, 1950  Transition of Care [facility]) CM/SW Contact:  Benjiman Bras, RN Phone Number: [phone] 10/10/2023, 9:57 AM   Clinical Narrative:     TOC CM spoke to pt and states he believes has RW at home. Offered choice for [facility] and agreeable to agency his wife picked. Medicare.gov listings with ratings placed on chart and provide to pt.  Contacted [employer] for [employer] for home.  Wife will provide transportation home.     Barriers to Discharge: Continued Medical Work up   Patient Goals and CMS Choice Patient states their goals for this hospitalization and ongoing recovery are:: wants to get better CMS Medicare.gov Compare Post Acute Care list provided to:: Patient Represenative (must comment) Choice offered to / list presented to : Spouse      Discharge Placement                       Discharge Plan and Services Additional resources added to the After Visit Summary for     Discharge Planning Services: CM Consult Post Acute Care Choice: Home Health          DME Arranged: Walker rolling with seat DME Agency: [employer]       HH Arranged: PT HH Agency: [facility] Date [facility] Agency Contacted: 10/10/23 Time HH Agency Contacted: [phone] Representative spoke with at [facility] Agency: [facility]  Social Drivers of Health (SDOH) Interventions SDOH Screenings   Food Insecurity: No Food Insecurity (09/22/2023)  Housing: Low Risk  (09/22/2023)  Transportation Needs: No Transportation Needs (09/22/2023)  Utilities: Not At Risk (09/22/2023)  Alcohol Screen: Low Risk  (08/21/2023)  Depression (PHQ2-9): Low Risk  (08/21/2023)  Financial Resource Strain: Low Risk  (08/21/2023)  Physical Activity: Inactive (08/21/2023)  Social Connections: Socially Integrated (09/22/2023)  Stress: No Stress Concern Present (08/21/2023)  Tobacco Use: Medium Risk (10/06/2023)  Health  Literacy: Adequate Health Literacy (08/21/2023)     Readmission Risk Interventions     No data to display

## 2023-10-10 NOTE — Telephone Encounter (Signed)
 Ref to community paramedicine placed per Dennise Fitz, NP, pt will be d/c by end of week, f/u appt sch for 7/1 at 1:30 pm

## 2023-10-10 NOTE — Plan of Care (Signed)
  Problem: Coping: Goal: Ability to adjust to condition or change in health will improve Outcome: Progressing   Problem: Health Behavior/Discharge Planning: Goal: Ability to identify and utilize available resources and services will improve Outcome: Progressing Goal: Ability to manage health-related needs will improve Outcome: Progressing   Problem: Metabolic: Goal: Ability to maintain appropriate glucose levels will improve Outcome: Progressing   Problem: Skin Integrity: Goal: Risk for impaired skin integrity will decrease Outcome: Progressing   Problem: Education: Goal: Knowledge of General Education information will improve Description: Including pain rating scale, medication(s)/side effects and non-pharmacologic comfort measures Outcome: Progressing   Problem: Health Behavior/Discharge Planning: Goal: Ability to manage health-related needs will improve Outcome: Progressing   Problem: Clinical Measurements: Goal: Ability to maintain clinical measurements within normal limits will improve Outcome: Progressing Goal: Will remain free from infection Outcome: Progressing Goal: Diagnostic test results will improve Outcome: Progressing Goal: Respiratory complications will improve Outcome: Progressing Goal: Cardiovascular complication will be avoided Outcome: Progressing

## 2023-10-10 NOTE — Progress Notes (Signed)
 VAST consult for HD to be pulled. HOB less than 45*. Held breath for line removal. Pressure held for 20 min no s/sx of bleeding. Instructed pt to remain in bed 30-45 min and notify staff for any s/sx of bleeding. Drsg to remain CDI for 24hrs. Pt.. VU. Notified RN. Theophilus Fitz, RN VAST

## 2023-10-10 NOTE — Progress Notes (Signed)
  Heart and Vascular Care Navigation  10/10/2023  [name] 02-19-1951 [id number]  Reason for Referral: Patient referred to [facility].   Engaged with patient face to face for initial visit for Heart and Vascular Care Coordination.                                                                                                   Assessment:  Patient is a 73 yo married male with 4 adult sons. He and his wife have been married for over 50 years and reports very supportive sons. Wife states that 2 sons live close by and are very supportive.  Patient states he is motivated to stay out of the hospital and welcomes the Paramedic to help him learn more about HF and understanding his medications.  He states he has a scale at home. Patient and his wife deny any [facility] needs at this time.                                 HRT/VAS Care Coordination     Patients Home Cardiology Office Heart Failure Clinic   Outpatient Care Team [facility]   Living arrangements for the past 2 months Single Family Home   Lives with: Self; Spouse   Patient Current Insurance Coverage Managed Medicare   Patient Has Concern With Paying Medical Bills No   Does Patient Have Prescription Coverage? Yes   Home Assistive Devices/Equipment Scales; Walker (specify type); Blood pressure cuff   DME Agency [employer]   [facility] Agency [facility]       Social History:                                                                             SDOH Screenings   Food Insecurity: No Food Insecurity (09/22/2023)  Housing: Low Risk  (09/22/2023)  Transportation Needs: No Transportation Needs (09/22/2023)  Utilities: Not At Risk (09/22/2023)  Alcohol Screen: Low Risk  (08/21/2023)  Depression (PHQ2-9): Low Risk  (08/21/2023)  Financial Resource Strain: Low Risk  (08/21/2023)  Physical Activity: Inactive (08/21/2023)  Social Connections: Socially Integrated (09/22/2023)  Stress: No Stress Concern Present  (08/21/2023)  Tobacco Use: Medium Risk (10/06/2023)  Health Literacy: Adequate Health Literacy (08/21/2023)    SDOH Interventions: Financial Resources:    N/a  Food Insecurity:  N/a   Housing Insecurity:  N/a  Transportation:   N/A   Follow-up plan: Patient will be seen by [employer] upon discharge and will have follow up in HF Clinic on 10-24-23. CSW available as needed. Aubry Blase, LCSW, CCSW-MCS [phone]

## 2023-10-10 NOTE — Progress Notes (Signed)
 Pt sleeping, will return later.  Barkley Li MS, ACSM-CEP 11:37 AM 10/10/2023

## 2023-10-10 NOTE — Plan of Care (Signed)
   Problem: Tissue Perfusion: Goal: Adequacy of tissue perfusion will improve Outcome: Progressing

## 2023-10-10 NOTE — TOC Transition Note (Addendum)
 Transition of Care [facility]) - Discharge Note   Patient Details  Name: [name] MRN: [id number] Date of Birth: 03/05/1951  Transition of Care [facility]) CM/SW Contact:  Ernst Heap Phone Number: [phone] 10/10/2023, 11:43 AM   Clinical Narrative:  11:41 AM- HF CSW called and left a VM asking to be called back to schedule the patients hospital follow up appointment with his PCP.   Hospital follow up appointment with PCP scheduled for Thursday, October 12, 2023 at 3:00 PM.   TOC will continue following.        Barriers to Discharge: Continued Medical Work up   Patient Goals and CMS Choice Patient states their goals for this hospitalization and ongoing recovery are:: wants to get better CMS Medicare.gov Compare Post Acute Care list provided to:: Patient Represenative (must comment) Choice offered to / list presented to : Spouse      Discharge Placement                       Discharge Plan and Services Additional resources added to the After Visit Summary for     Discharge Planning Services: CM Consult Post Acute Care Choice: Home Health          DME Arranged: Walker rolling with seat DME Agency: [employer]       HH Arranged: PT HH Agency: [facility] Date [facility] Agency Contacted: 10/10/23 Time HH Agency Contacted: [phone] Representative spoke with at [facility] Agency: [facility]  Social Drivers of Health (SDOH) Interventions SDOH Screenings   Food Insecurity: No Food Insecurity (09/22/2023)  Housing: Low Risk  (09/22/2023)  Transportation Needs: No Transportation Needs (09/22/2023)  Utilities: Not At Risk (09/22/2023)  Alcohol Screen: Low Risk  (08/21/2023)  Depression (PHQ2-9): Low Risk  (08/21/2023)  Financial Resource Strain: Low Risk  (08/21/2023)  Physical Activity: Inactive (08/21/2023)  Social Connections: Socially Integrated (09/22/2023)  Stress: No Stress Concern Present (08/21/2023)  Tobacco Use: Medium Risk (10/06/2023)  Health Literacy: Adequate  Health Literacy (08/21/2023)     Readmission Risk Interventions     No data to display

## 2023-10-10 NOTE — Progress Notes (Signed)
 Occupational Therapy Treatment Patient Details Name: [name] MRN: [id number] DOB: Jul 01, 1950 Today's Date: 10/10/2023   History of present illness 73 y.o. male presenting 09/21/23 with chest pain and elevated troponins. NSTEMI; 5/29 L heart cath with multivessel CAD, 6/04 CABGx4; 6/5 extubated; post-CABG cardioplegia requiring inotropic support; decr renal function with uremic encephalopathy, CRRT 6/7-6/11 PMH: DM, MI, HTN   OT comments  Patient demonstrating good gains with OT treatment. Patient able to recall sternal precautions with cues and verbal cues to maintain during bed mobility and transfers. Patient able to stand at sink for self care tasks with supervision and no LOB and required assistance to reach feet for LB ADLs. Discharge plans changed to [facility] due to progress with mobility and self care. Acute OT to continue to follow to address established goals.      If plan is discharge home, recommend the following:  A little help with walking and/or transfers;A little help with bathing/dressing/bathroom;Direct supervision/assist for financial management;Direct supervision/assist for medications management;Assist for transportation   Equipment Recommendations  BSC/3in1    Recommendations for Other Services      Precautions / Restrictions Precautions Precautions: Fall;Sternal Precaution Booklet Issued: No Recall of Precautions/Restrictions: Impaired Precaution/Restrictions Comments: able to recall sternal precautions with cues Restrictions Weight Bearing Restrictions Per Provider Order: Yes RUE Weight Bearing Per Provider Order: Non weight bearing LUE Weight Bearing Per Provider Order: Non weight bearing Other Position/Activity Restrictions: sternal       Mobility Bed Mobility Overal bed mobility: Needs Assistance Bed Mobility: Rolling, Sidelying to Sit Rolling: Contact guard assist Sidelying to sit: Min assist       General bed mobility comments: cues to maintain  sternal precautions    Transfers Overall transfer level: Needs assistance Equipment used: Rolling walker (2 wheels) Transfers: Sit to/from Stand Sit to Stand: Contact guard assist, From elevated surface           General transfer comment: cues to use momentum and hand on knees to maintain precautions     Balance Overall balance assessment: Needs assistance Sitting-balance support: No upper extremity supported, Feet supported Sitting balance-Leahy Scale: Fair Sitting balance - Comments: EOB   Standing balance support: Single extremity supported, Bilateral upper extremity supported, During functional activity Standing balance-Leahy Scale: Poor Standing balance comment: one extremity support when standing at sink                           ADL either performed or assessed with clinical judgement   ADL Overall ADL's : Needs assistance/impaired     Grooming: Wash/dry hands;Wash/dry face;Supervision/safety;Standing   Upper Body Bathing: Supervision/ safety;Standing   Lower Body Bathing: Moderate assistance   Upper Body Dressing : Minimal assistance;Sitting Upper Body Dressing Details (indicate cue type and reason): gown for back Lower Body Dressing: Moderate assistance Lower Body Dressing Details (indicate cue type and reason): for socks             Functional mobility during ADLs: Contact guard assist;Rolling walker (2 wheels) General ADL Comments: difficulty reaching feet for LB ADLs    Extremity/Trunk Assessment              Vision       Perception     Praxis     Communication Communication Communication: Impaired Factors Affecting Communication: Hearing impaired   Cognition Arousal: Alert Behavior During Therapy: WFL for tasks assessed/performed Cognition: Cognition impaired           Executive functioning impairment (  select all impairments): Problem solving OT - Cognition Comments: jovial and pleasant                  Following commands: Intact Following commands impaired: Follows one step commands with increased time      Cueing   Cueing Techniques: Verbal cues  Exercises      Shoulder Instructions       General Comments      Pertinent Vitals/ Pain       Pain Assessment Pain Assessment: No/denies pain Pain Intervention(s): Monitored during session  Home Living                                          Prior Functioning/Environment              Frequency  Min 2X/week        Progress Toward Goals  OT Goals(current goals can now be found in the care plan section)  Progress towards OT goals: Progressing toward goals  Acute Rehab OT Goals Patient Stated Goal: to go home OT Goal Formulation: With patient Time For Goal Achievement: 10/16/23 Potential to Achieve Goals: Good ADL Goals Pt Will Perform Grooming: with set-up;with supervision;sitting Pt Will Perform Upper Body Bathing: with min assist;sitting Pt Will Perform Lower Body Bathing: with mod assist;sit to/from stand Pt Will Transfer to Toilet: with mod assist;bedside commode Additional ADL Goal #1: Pt will independently verbalize understanding of sternal precautions for ADL  Plan      Co-evaluation                 AM-PAC OT 6 Clicks Daily Activity     Outcome Measure   Help from another person eating meals?: None Help from another person taking care of personal grooming?: A Little Help from another person toileting, which includes using toliet, bedpan, or urinal?: A Little Help from another person bathing (including washing, rinsing, drying)?: A Lot Help from another person to put on and taking off regular upper body clothing?: A Little Help from another person to put on and taking off regular lower body clothing?: A Lot 6 Click Score: 17    End of Session Equipment Utilized During Treatment: Gait belt;Rolling walker (2 wheels)  OT Visit Diagnosis: Unsteadiness on feet  (R26.81);Other abnormalities of gait and mobility (R26.89);Muscle weakness (generalized) (M62.81);Other symptoms and signs involving cognitive function   Activity Tolerance Patient tolerated treatment well   Patient Left in chair;with call bell/phone within reach;with chair alarm set   Nurse Communication Mobility status        Time: 9629-5284 OT Time Calculation (min): 26 min  Charges: OT General Charges $OT Visit: 1 Visit OT Treatments $Self Care/Home Management : 8-22 mins $Therapeutic Activity: 8-22 mins  Anitra Barn, OTA Acute Rehabilitation Services  Office [phone]   Jovita Nipper 10/10/2023, 11:38 AM

## 2023-10-10 NOTE — Progress Notes (Addendum)
 [address]       [place] [address]             [phone]      13 Days Post-Op Procedure(s) (LRB): CORONARY ARTERY BYPASS GRAFTING (CABG) TIMES FOUR UTILIZING THE LEFT INTERNAL MAMMARY ARTERY AND ENDOSCOPIC VEIN HARVEST RIGHT GREATER SAPHENOUS VEIN (N/A) ECHOCARDIOGRAM, TRANSESOPHAGEAL, INTRAOPERATIVE (N/A) Subjective: Patient sitting in the chair, no new complaints this AM.  Objective: Vital signs in last 24 hours: Temp:  [96 F (35.6 C)-98.8 F (37.1 C)] 96 F (35.6 C) (06/17 0700) Pulse Rate:  [83-120] 86 (06/17 0700) Cardiac Rhythm: Normal sinus rhythm;Bundle branch block (06/16 1901) Resp:  [11-25] 16 (06/17 0700) BP: (106-151)/(60-101) 136/70 (06/17 0700) SpO2:  [93 %-100 %] 95 % (06/17 0700) Weight:  [89.4 kg] 89.4 kg (06/17 0258)  Hemodynamic parameters for last 24 hours: CVP:  [5 mmHg] 5 mmHg  Intake/Output from previous day: 06/16 0701 - 06/17 0700 In: 480 [P.O.:480] Out: 900 [Urine:900] Intake/Output this shift: No intake/output data recorded.  General appearance: alert, cooperative, and no distress Neurologic: intact Heart: regular rate and rhythm, S1, S2 normal, no murmur, click, rub or gallop Lungs: diminished left basilar breath sounds Abdomen: soft, non-tender; bowel sounds normal; no masses,  no organomegaly Extremities: edema trace edema BLE Wound: Clean and dry without sign of infection  Lab Results: Recent Labs    10/08/23 0457  WBC 12.6*  HGB 8.1*  HCT 26.0*  PLT 340   BMET:  Recent Labs    10/08/23 0457 10/09/23 0436  NA 135 139  K 3.7 3.7  CL 98 101  CO2 26 27  GLUCOSE 183* 224*  BUN 49* 53*  CREATININE 3.49* 2.89*  CALCIUM  7.9* 8.4*    PT/INR: No results for input(s): LABPROT, INR in the last 72 hours. ABG    Component Value Date/Time   PHART 7.336 (L) 09/28/2023 0854   HCO3 20.5 09/28/2023 0854   TCO2 22 09/28/2023 0854   ACIDBASEDEF 5.0 (H) 09/28/2023 0854   O2SAT 65.1 10/09/2023 0436    CBG (last 3)  Recent Labs    10/09/23 1636 10/09/23 2110 10/10/23 0602  GLUCAP 128* 209* 165*    Assessment/Plan: S/P Procedure(s) (LRB): CORONARY ARTERY BYPASS GRAFTING (CABG) TIMES FOUR UTILIZING THE LEFT INTERNAL MAMMARY ARTERY AND ENDOSCOPIC VEIN HARVEST RIGHT GREATER SAPHENOUS VEIN (N/A) ECHOCARDIOGRAM, TRANSESOPHAGEAL, INTRAOPERATIVE (N/A)  CV: Hx of NSTEMI, on Plavix . HFrEF, on Coreg  3.125. Off milrinone , coox stable. AHF assisting with titration of GDMT as able but restricted with renal failure. NSR, HR 80s-90s. SBP 110-140, AHF to possible add Bidil prior to discharge. ST elevation on tele but patient is without chest pain and no murmur heard.    Pulm: Saturating well on RA this AM. CXR with bibasilar atelectasis and small left pleural effusion. Encourage IS, flutter valve and ambulation.    GI: +BM, Tolerating a diet. No nausea/vomiting.    Endo: uncontrolled T2DM, preop A1C 11.3. CBGs better controlled on Semglee  10U daily, Novolog  5U TID with meals and SSI. One reading at 209, will hold off on increasing insulin  for today.    Renal: Preoperative CKD stage 3 baseline Cr 1.3-1.6 with postoperative renal failure, off CRRT. Under preop weight. Nephrology following, anticipating he may need iHD today per nephrology, will see what they think today. May need tunneled dialysis catheter if he continues to need dialysis. Last Cr 2.89, down from 3.49. UO 900cc/24hrs. BMET today not drawn yet.   ID: Likely  reactive leukocytosis, last WBC 12.6. Afebrile. Clinically monitor. CBC pending.   Expected postop ABLA: Last H/H 8.1/26, no clinical signs of bleeding. Not clinically significant at this time. Nephrology to possibly start ESA if needed this week. CBC today not drawn yet.   DVT Prophylaxis: On heparin  injection 5000U Q8H.    Deconditioning: PT/OT now recommending HH, CIR has signed off. Continue work with PT/OT.    Dispo: Dispo planning   LOS: 19 days    Angela Barban,  PA-C 10/10/2023  Patient seen and examined, agree with above Looks great Ambulated already this AM Labs pending Dc planning pending clarification on need for HD/ tunneled cath, etc  Landon Pinion C. [name], MD [facility] [phone]

## 2023-10-11 DIAGNOSIS — I214 Non-ST elevation (NSTEMI) myocardial infarction: Secondary | ICD-10-CM | POA: Diagnosis not present

## 2023-10-11 LAB — RENAL FUNCTION PANEL
Albumin: 2.2 g/dL — ABNORMAL LOW (ref 3.5–5.0)
Anion gap: 12 (ref 5–15)
BUN: 64 mg/dL — ABNORMAL HIGH (ref 8–23)
CO2: 26 mmol/L (ref 22–32)
Calcium: 8.6 mg/dL — ABNORMAL LOW (ref 8.9–10.3)
Chloride: 102 mmol/L (ref 98–111)
Creatinine, Ser: 2.69 mg/dL — ABNORMAL HIGH (ref 0.61–1.24)
GFR, Estimated: 24 mL/min — ABNORMAL LOW (ref >60)
Glucose, Bld: 189 mg/dL — ABNORMAL HIGH (ref 70–99)
Phosphorus: 4.1 mg/dL (ref 2.5–4.6)
Potassium: 4 mmol/L (ref 3.5–5.1)
Sodium: 140 mmol/L (ref 135–145)

## 2023-10-11 LAB — GLUCOSE, CAPILLARY
Glucose-Capillary: 187 mg/dL — ABNORMAL HIGH (ref 70–99)
Glucose-Capillary: 146 mg/dL — ABNORMAL HIGH (ref 70–99)
Glucose-Capillary: 185 mg/dL — ABNORMAL HIGH (ref 70–99)
Glucose-Capillary: 276 mg/dL — ABNORMAL HIGH (ref 70–99)

## 2023-10-11 LAB — MAGNESIUM: Magnesium: 2.3 mg/dL (ref 1.7–2.4)

## 2023-10-11 MED ORDER — GLUCERNA SHAKE PO LIQD
237.0000 mL | Freq: Three times a day (TID) | ORAL | Status: DC
  Administered 2023-10-13: 237 mL via ORAL

## 2023-10-11 MED ORDER — INSULIN GLARGINE-YFGN 100 UNIT/ML ~~LOC~~ SOLN
12.0000 [IU] | Freq: Every day | SUBCUTANEOUS | Status: DC
  Administered 2023-10-11 – 2023-10-13 (×3): 12 [IU] via SUBCUTANEOUS
  Filled 2023-10-11 (×3): qty 0.12

## 2023-10-11 NOTE — Progress Notes (Signed)
 Mobility Specialist Progress Note:    10/11/23 0955  Mobility  Activity Ambulated with assistance in room  Level of Assistance Standby assist, set-up cues, supervision of patient - no hands on  Assistive Device Four wheel walker  Distance Ambulated (ft) 5 ft  RUE Weight Bearing Per Provider Order NWB  LUE Weight Bearing Per Provider Order NWB  Activity Response Tolerated well  Mobility Referral Yes  Mobility visit 1 Mobility  Mobility Specialist Start Time (ACUTE ONLY) 0945  Mobility Specialist Stop Time (ACUTE ONLY) [id number]  Mobility Specialist Time Calculation (min) (ACUTE ONLY) 7 min   Pt received sitting in 4WW by sink, waiting for PT session. Assisted pt back to chair to wait. SV with 4WW required for safety. Left sitting in chair with all needs met.    Jalani Rominger Mobility Specialist Please contact via Special educational needs teacher or  Rehab office at [phone]

## 2023-10-11 NOTE — Progress Notes (Signed)
 Patient ID: [name], male   DOB: October 29, 1950, 73 y.o.   MRN: [id number] [name] Progress Note   Assessment/ Plan:    1.  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 (BL Cr 1.3 - 1.6).  Developed nonoliguric postoperative acute kidney injury likely from ischemic ATN given difficulties of weaning from cardiopulmonary bypass.  Started on CRRT on 6/7 for management of volume status (fixed urine output on high-dose furosemide ) and azotemia with mental status improving after CRRT per charting  ------------------ - Creatinine slowly downtrending encouraging and good urine output.  Likely will continue to improve with time.  We will sign off at this time and set up follow-up in our office.  Dialysis catheter has been removed  2.  NSTEMI: With multivessel coronary artery disease and underwent four-vessel CABG on 09/27/2023.  Presented 09/21/23.  Postoperative course significant for difficulty with getting him off cardiopulmonary bypass and developed postoperative shock with pressor/inotrope support. - post-operative care per CT surgery   3.  Postoperative shock: Previously on norepinephrine , epinephrine  (now off)  - s/p milrinone  per CHF team  - now off  4.  Normocytic Anemia: Likely associated with surgical/postoperative blood loss.  - PRBC's per primary team  - trend labs  5. CKD stage 3a - note baseline Cr 1.3 - 1.6   Disposition - continue inpatient monitoring; improving    Subjective:   Patient feels well today with no complaints.  Creatinine down to 2.7.  Feels like he is urinating normally.  Wants to go home   Objective:   BP (!) 145/73 (BP Location: Right Arm)   Pulse 89   Temp 98.2 F (36.8 C) (Oral)   Resp 19   Ht 5' 4 (1.626 m)   Wt 89.6 kg   SpO2 95%   BMI 33.91 kg/m   Intake/Output Summary (Last 24 hours) at 10/11/2023 0932 Last data filed at 10/11/2023 0753 Gross per 24  hour  Intake --  Output 500 ml  Net -500 ml   Weight change: 0.2 kg  Physical Exam:      GEN: wdwn, sitting in bed, nad ENT: no nasal discharge, mmm EYES: no scleral icterus, eomi CV: normal rate, no murmurs PULM: no iwob, bilateral chest rise ABD: NABS, non-distended SKIN: no rashes or jaundice EXT: no edema, warm and well perfused      Imaging: DG Chest 2 View Result Date: 10/10/2023 CLINICAL DATA:  Status post CABG. EXAM: CHEST - 2 VIEW COMPARISON:  10/03/2023 FINDINGS: Low volume film. The cardio pericardial silhouette is enlarged. Minimal atelectasis in the left mid lung and at the left base with tiny left pleural effusion. Left IJ central line tip overlies the innominate vein confluence. No acute bony abnormality. IMPRESSION: Low volume film with minimal atelectasis in the left mid lung and at the left base. Tiny left pleural effusion. Electronically Signed   By: Donnal Fusi M.D.   On: 10/10/2023 07:40      Labs: BMET Recent Labs  Lab 10/05/23 0400 10/06/23 0533 10/07/23 0513 10/08/23 0457 10/09/23 0436 10/10/23 0819 10/11/23 0323  NA 136 139 135 135 139 141  139 140  K 3.7 3.9 3.7 3.7 3.7 3.9  3.9 4.0  CL 102 103 97* 98 101 101  100 102  CO2 25 25 28 26 27 27  27 26   GLUCOSE 93 83 173* 183* 224* 162*  159* 189*  BUN 25* 45* 32*  49* 53* 59*  59* 64*  CREATININE 2.96* 3.56* 2.91* 3.49* 2.89* 2.76*  2.76* 2.69*  CALCIUM  8.2* 8.5* 8.1* 7.9* 8.4* 8.7*  8.7* 8.6*  PHOS 3.2 4.3 3.4 4.4 4.2 4.3 4.1   CBC Recent Labs  Lab 10/06/23 0533 10/08/23 0457 10/10/23 0819  WBC 12.9* 12.6* 13.4*  HGB 9.0* 8.1* 8.5*  HCT 28.8* 26.0* 27.4*  MCV 90.3 90.3 90.7  PLT 329 340 431*    Medications:     (feeding supplement) PROSource Plus  30 mL Oral TID WC   alteplase   2 mg Intracatheter Once   aspirin  EC  81 mg Oral Daily   atorvastatin   80 mg Oral Daily   bisacodyl   10 mg Oral Daily   Or   bisacodyl   10 mg Rectal Daily   carvedilol   3.125 mg Oral BID WC    Chlorhexidine  Gluconate Cloth  6 each Topical Q0600   clopidogrel   75 mg Oral Daily   docusate sodium   200 mg Oral Daily   feeding supplement  1 Container Oral TID BM   heparin  injection (subcutaneous)  5,000 Units Subcutaneous Q8H   insulin  aspart  0-9 Units Subcutaneous TID WC   insulin  aspart  5 Units Subcutaneous TID WC   insulin  glargine-yfgn  12 Units Subcutaneous Daily   isosorbide-hydrALAZINE   0.5 tablet Oral TID   multivitamin  1 tablet Oral QHS   traZODone   50 mg Oral QHS    Levorn Reason, MD 10/11/2023, 9:32 AM

## 2023-10-11 NOTE — Progress Notes (Signed)
 [address]       [place] [address]             [phone]      14 Days Post-Op Procedure(s) (LRB): CORONARY ARTERY BYPASS GRAFTING (CABG) TIMES FOUR UTILIZING THE LEFT INTERNAL MAMMARY ARTERY AND ENDOSCOPIC VEIN HARVEST RIGHT GREATER SAPHENOUS VEIN (N/A) ECHOCARDIOGRAM, TRANSESOPHAGEAL, INTRAOPERATIVE (N/A) Subjective: Patient sleeping on arrival, was awake by the time I left. No complaints this AM.   Objective: Vital signs in last 24 hours: Temp:  [97.1 F (36.2 C)-99 F (37.2 C)] 99 F (37.2 C) (06/18 0411) Pulse Rate:  [83-99] 99 (06/18 0411) Cardiac Rhythm: Normal sinus rhythm (06/18 0340) Resp:  [14-20] 20 (06/18 0411) BP: (117-140)/(61-85) 140/85 (06/18 0411) SpO2:  [94 %-98 %] 97 % (06/18 0411) Weight:  [89.6 kg] 89.6 kg (06/18 0411)  Hemodynamic parameters for last 24 hours:    Intake/Output from previous day: 06/17 0701 - 06/18 0700 In: -  Out: 300 [Urine:300] Intake/Output this shift: No intake/output data recorded.  General appearance: alert, cooperative, and no distress Neurologic: intact Heart: regular rate and rhythm, S1, S2 normal, no murmur, click, rub or gallop and PVCs Lungs: diminished bibasilar breath sounds, slight wheezes throughout Abdomen: soft, non-tender; bowel sounds normal; no masses,  no organomegaly Extremities: edema trace BLE Wound: Clean and dry without sign of infection  Lab Results: Recent Labs    10/10/23 0819  WBC 13.4*  HGB 8.5*  HCT 27.4*  PLT 431*   BMET:  Recent Labs    10/10/23 0819 10/11/23 0323  NA 141  139 140  K 3.9  3.9 4.0  CL 101  100 102  CO2 27  27 26   GLUCOSE 162*  159* 189*  BUN 59*  59* 64*  CREATININE 2.76*  2.76* 2.69*  CALCIUM  8.7*  8.7* 8.6*    PT/INR: No results for input(s): LABPROT, INR in the last 72 hours. ABG    Component Value Date/Time   PHART 7.336 (L) 09/28/2023 0854   HCO3 20.5 09/28/2023 0854   TCO2 22 09/28/2023 0854   ACIDBASEDEF 5.0  (H) 09/28/2023 0854   O2SAT 60.4 10/10/2023 0753   CBG (last 3)  Recent Labs    10/10/23 1605 10/10/23 2231 10/11/23 0618  GLUCAP 205* 239* 187*    Assessment/Plan: S/P Procedure(s) (LRB): CORONARY ARTERY BYPASS GRAFTING (CABG) TIMES FOUR UTILIZING THE LEFT INTERNAL MAMMARY ARTERY AND ENDOSCOPIC VEIN HARVEST RIGHT GREATER SAPHENOUS VEIN (N/A) ECHOCARDIOGRAM, TRANSESOPHAGEAL, INTRAOPERATIVE (N/A)  CV: Hx of NSTEMI, on Plavix . HFrEF, on Coreg  3.125. Off milrinone , coox stable. AHF assisting with titration of GDMT as able but restricted with renal failure. NSR, HR 80s-90s, some PVCs. SBP 110s-140, AHF started Bidil 1/2 tab TID. ST elevation on tele but patient is without chest pain and no murmur heard. EKG yesterday with LVH, nonspecific intraventricular block, anteroseptal infarct. Does not look much different from prior EKG.    Pulm: Saturating well on RA this AM. CXR with bibasilar atelectasis and small left pleural effusion. Encourage IS, flutter valve and ambulation. Slight wheezing, continue zoponex neb as needed.   GI: +BM yesterday, Tolerating a diet. No nausea/vomiting.    Endo: Uncontrolled T2DM, preop A1C 11.3. CBGs in the 200s on Semglee  10U daily, Novolog  5U TID with meals and SSI. Will increase Semglee  to 12U daily.    Renal: AKI on CKD stage 3 baseline Cr 1.3-1.6 with postoperative renal failure, off CRRT. Nephrology following, deferred iHD. Removed dialysis catheter. Last  Cr 2.69 improving. UO 300cc/24hrs but RN reports he was voiding just unsure how much.    ID: Likely reactive leukocytosis stable, could also be due to lines. Nontunneled catheter has been removed, should help. last WBC 13.4. Afebrile. Clinically monitor.    Expected postop ABLA: Last H/H 8.5/27.4, improved. No clinical signs of bleeding. Not clinically significant at this time.    DVT Prophylaxis: On heparin  injection 5000U Q8H.    Deconditioning: PT/OT now recommending HH, CIR has signed off. Continue  work with PT/OT.    Dispo: Patient's dialysis catheter has been removed, creatinine is improving. Nephrology may sign off today. AHF following, will discuss with them but plan to discharge by the end of the week.    LOS: 20 days    Angela Barban, PA-C 10/11/2023

## 2023-10-11 NOTE — Progress Notes (Signed)
 Nutrition Follow-up  DOCUMENTATION CODES:   Obesity unspecified  INTERVENTION:  Continue to encourage adequate oral intake Discontinue Boost breeze and ProSource oral Glucerna Shake po TID, each supplement provides 220 kcal and 10 grams of protein  NUTRITION DIAGNOSIS:   Inadequate oral intake related to altered GI function as evidenced by NPO status. - improving  GOAL:   Patient will meet greater than or equal to 90% of their needs - progressing  MONITOR:   PO intake, Supplement acceptance, I & O's, Labs  REASON FOR ASSESSMENT:   Consult Enteral/tube feeding initiation and management, Assessment of nutrition requirement/status  ASSESSMENT:   Pt with PMH of CAD, uncontrolled DM, HTN, CKD stage III, HLD, and chronic CHF who was admitted 5/29 for chest pain/acute NSTEMI.  5/29 - admitted with acute NSTEMI 6/04 - s/p CABG x 4; remained intubated post op  6/05 - extubated 6/06 - +ileus, unable to successfully advance Cortrak to stomach, discussed with Dr. Fulton Job and NG tube placed instead for decompression 6/07 - CRRT initiated 6/09 - NG removed, CL diet 6/10 - diet advanced to Renal/Carb Modified with 1800 mL FR 6/11 - CRRT discontinued  Nephrology signed off with plan for outpatient follow up. Noted renal recovery. HD catheter removed.   Per Cardiothoracic surgery, noted plans for possible discharge by the end of the week.   Pt sitting up in bedside recliner. He is in good spirits today.  He states that he has been eating well. Feels satisfied with amount of food provided. He reports consuming 3 meals per day.   Pt has been receiving prosource which he has been taking but only recalls consuming 1 daily. He feels these are causing diarrhea. He does not like the boost breeze and has been declining these. He is amenable to an alternative supplement if necessary  Meal completions: 6/13: 100% breakfast, 75% lunch, 75% dinner 6/14: 95% breakfast, 85% dinner 6/15: 100%  breakfast 6/16: 75% breakfast, 80% lunch  Weights throughout admission have been variable but notably trending down. Change in weight trends likely r/t diuresis administration and CRRT.  Medications: dolcolax daily, colace daily, SSI 0-9 units TID, SSI 5 units TID, semglee  (increased today) 12 units daily, rena-vit  Labs:  BUN 64 Cr 2.69 GFR 24 CBG's 146-239 x24 hours  Diet Order:   Diet Order             Diet Carb Modified Fluid consistency: Thin; Room service appropriate? Yes with Assist  Diet effective now                   EDUCATION NEEDS:   Education needs have been addressed  Skin:  Skin Assessment: Skin Integrity Issues: Skin Integrity Issues:: Incisions Incisions: R leg, chest  Last BM:  6/17  Height:   Ht Readings from Last 1 Encounters:  09/27/23 5' 4 (1.626 m)    Weight:   Wt Readings from Last 1 Encounters:  10/11/23 89.6 kg   BMI:  Body mass index is 33.91 kg/m.  Estimated Nutritional Needs:   Kcal:  1900-2100  Protein:  100-115 grams  Fluid:  >1.9 L/day  Rocklin Chute, RDN, LDN Clinical Nutrition See AMi[facility] for contact information.

## 2023-10-11 NOTE — Progress Notes (Signed)
 Physical Therapy Treatment Patient Details Name: [name] MRN: [id number] DOB: Mar 19, 1951 Today's Date: 10/11/2023   History of Present Illness 73 y.o. male presenting 09/21/23 with chest pain and elevated troponins. NSTEMI; 5/29 L heart cath with multivessel CAD, 6/04 CABGx4; 6/5 extubated; post-CABG cardioplegia requiring inotropic support; decr renal function with uremic encephalopathy, CRRT 6/7-6/11 PMH: DM, MI, HTN    PT Comments  Pt up in chair on arrival and agreeable to session with good progress towards acute goals. Pt demonstrating increased activity tolerance, progressing gait distance with rollator support with grossly CGA progressing to supervision with increased distance. Pt continues to be easily distractible, drifting R/L in hall with head turns, however pt able to avoid all obstacles and without LOB. Pt continues to require cues during mobility for adherence to sternal precautions with poor recall from start to end of session. Pt continues to benefit from skilled PT services to progress toward functional mobility goals.      If plan is discharge home, recommend the following: Assistance with cooking/housework;Assist for transportation;Help with stairs or ramp for entrance;Supervision due to cognitive status;Direct supervision/assist for financial management;Direct supervision/assist for medications management;A little help with walking and/or transfers   Can travel by private vehicle        Equipment Recommendations  Rolling walker (2 wheels)    Recommendations for Other Services       Precautions / Restrictions Precautions Precautions: Fall;Sternal Precaution Booklet Issued: No Recall of Precautions/Restrictions: Impaired Precaution/Restrictions Comments: able to recall sternal precautions with cues Restrictions Weight Bearing Restrictions Per Provider Order: Yes Other Position/Activity Restrictions: sternal     Mobility  Bed Mobility Overal bed mobility: Needs  Assistance             General bed mobility comments: up in chair on arrival    Transfers Overall transfer level: Needs assistance Equipment used: Rollator (4 wheels) Transfers: Sit to/from Stand Sit to Stand: Contact guard assist           General transfer comment: cues to use momentum and hand on knees to maintain precautions    Ambulation/Gait Ambulation/Gait assistance: Contact guard assist Gait Distance (Feet): 470 Feet Assistive device: Rollator (4 wheels) Gait Pattern/deviations: Step-through pattern, Decreased stride length, Shuffle, Drifts right/left Gait velocity: decr     General Gait Details: Tends to drift right/left when externally distracted. Poor foot clearance with shortened steps. Cues to increase stride length.   Stairs             Wheelchair Mobility     Tilt Bed    Modified Rankin (Stroke Patients Only)       Balance Overall balance assessment: Needs assistance Sitting-balance support: No upper extremity supported, Feet supported Sitting balance-Leahy Scale: Fair Sitting balance - Comments: EOB   Standing balance support: Single extremity supported, Bilateral upper extremity supported, During functional activity Standing balance-Leahy Scale: Poor Standing balance comment: one extremity support when standing at sink                            Communication Communication Communication: Impaired Factors Affecting Communication: Hearing impaired  Cognition Arousal: Alert Behavior During Therapy: WFL for tasks assessed/performed   PT - Cognitive impairments: Safety/Judgement, Attention                       PT - Cognition Comments: Min cues to adhere to sternal precautions throughout session Tends to get distracted by external stimuli requiring cues  to adhere to task; Following commands: Intact Following commands impaired: Follows one step commands with increased time    Cueing Cueing Techniques: Verbal  cues  Exercises      General Comments General comments (skin integrity, edema, etc.): VSS on RA      Pertinent Vitals/Pain Pain Assessment Pain Assessment: No/denies pain Pain Intervention(s): Monitored during session    Home Living                          Prior Function            PT Goals (current goals can now be found in the care plan section) Acute Rehab PT Goals Patient Stated Goal: agrees he wants to get moving PT Goal Formulation: With patient Time For Goal Achievement: 10/15/23 Progress towards PT goals: Progressing toward goals    Frequency    Min 2X/week      PT Plan      Co-evaluation              AM-PAC PT 6 Clicks Mobility   Outcome Measure  Help needed turning from your back to your side while in a flat bed without using bedrails?: A Little Help needed moving from lying on your back to sitting on the side of a flat bed without using bedrails?: A Little Help needed moving to and from a bed to a chair (including a wheelchair)?: A Little Help needed standing up from a chair using your arms (e.g., wheelchair or bedside chair)?: A Little Help needed to walk in hospital room?: A Little Help needed climbing 3-5 steps with a railing? : A Little 6 Click Score: 18    End of Session   Activity Tolerance: Patient tolerated treatment well Patient left: Other (comment) (it bathroom with pt verbalizing to use call cord when finished) Nurse Communication: Mobility status PT Visit Diagnosis: Unsteadiness on feet (R26.81);Muscle weakness (generalized) (M62.81);Difficulty in walking, not elsewhere classified (R26.2)     Time: 6578-4696 PT Time Calculation (min) (ACUTE ONLY): 19 min  Charges:    $Gait Training: 8-22 mins PT General Charges $$ ACUTE PT VISIT: 1 Visit                     Kyrel Leighton R. PTA Acute Rehabilitation Services Office: [phone]   Agapito Horseman 10/11/2023, 12:39 PM

## 2023-10-11 NOTE — Inpatient Diabetes Management (Signed)
 Inpatient Diabetes Program Recommendations  AACE/ADA: New Consensus Statement on Inpatient Glycemic Control (2015)  Target Ranges:  Prepandial:   less than 140 mg/dL      Peak postprandial:   less than 180 mg/dL (1-2 hours)      Critically ill patients:  140 - 180 mg/dL   Lab Results  Component Value Date   GLUCAP 187 (H) 10/11/2023   HGBA1C 11.3 (H) 09/22/2023    Review of Glycemic Control  Latest Reference Range & Units 10/10/23 07:31 10/10/23 11:11 10/10/23 16:05 10/10/23 22:31 10/11/23 06:18  Glucose-Capillary 70 - 99 mg/dL 409 (H) 811 (H) 914 (H) 239 (H) 187 (H)  (H): Data is abnormally high  Diabetes history: DM2 Outpatient Diabetes medications: basaglar  65 units every day, Fiasp  40 units QPM,  Metformin  500 BID, FSL 3  Current orders for Inpatient glycemic control: Semglee  12 units every day Novolog  0-9 units TID and Novolog  5 units [facility]  Inpatient Diabetes Program Recommendations:    Might consider increasing meal coverage TID:  Novolog  7 units TIDMC if he consumes at least 50%.  Thank you, Hays Lipschutz, MSN, CDCES Diabetes Coordinator Inpatient Diabetes Program [phone] (team pager from 8a-5p)

## 2023-10-11 NOTE — Progress Notes (Addendum)
 Patient ID: [name], male   DOB: 12-30-1950, 73 y.o.   MRN: [id number]     Advanced Heart Failure Rounding Note  Cardiologist: Hazle Lites, MD  Chief Complaint: Post cardiotomy shock Patient Profile   73 y.o. male with history of CAD, HFmrEF, uncontrolled DM II, HTN, HLD. Admitted with NSTEMI and acute on chronic CHF. Underwent CABG X 4 on 09/27/23 c/b postcardiotomy shock.  Subjective:    06/04: CABG X 4 with Dr. [name]. Difficulty separating from cardiopulmonary bypass 06/05: Extubated   POD #13  Is/Os not complete. Reports good UOP.   No shortness of breath, orthopnea or PND.  Objective:    Weight Range: 89.6 kg Body mass index is 33.91 kg/m.   Vital Signs:   Temp:  [96 F (35.6 C)-99 F (37.2 C)] 99 F (37.2 C) (06/18 0411) Pulse Rate:  [83-99] 99 (06/18 0411) Resp:  [14-20] 20 (06/18 0411) BP: (117-140)/(61-85) 140/85 (06/18 0411) SpO2:  [94 %-98 %] 97 % (06/18 0411) Weight:  [89.6 kg] 89.6 kg (06/18 0411) Last BM Date : 10/10/23  Weight change: Filed Weights   10/09/23 0446 10/10/23 0258 10/11/23 0411  Weight: 92.5 kg 89.4 kg 89.6 kg   Intake/Output:  Intake/Output Summary (Last 24 hours) at 10/11/2023 0659 Last data filed at 10/10/2023 2054 Gross per 24 hour  Intake --  Output 300 ml  Net -300 ml    Physical Exam   General:  Well appearing. Lying in bed Neck: JVP 7-8 Cor: Regular rate & rhythm. No rubs, gallops or murmurs. Sternum stable. Lungs: clear Abdomen: soft, nontender, nondistended.  Extremities: no edema Neuro: alert & orientedx3. Affect pleasant   Telemetry: NSR 80s, personally reviewed  Labs    CBC Recent Labs    10/10/23 0819  WBC 13.4*  HGB 8.5*  HCT 27.4*  MCV 90.7  PLT 431*   Basic Metabolic Panel Recent Labs    40/98/11 0819 10/11/23 0323  NA 141  139 140  K 3.9  3.9 4.0  CL 101  100 102  CO2 27  27 26   GLUCOSE 162*  159* 189*  BUN 59*  59* 64*  CREATININE 2.76*  2.76* 2.69*  CALCIUM  8.7*   8.7* 8.6*  MG 2.2 2.3  PHOS 4.3 4.1   Liver Function Tests Recent Labs    10/10/23 0819 10/11/23 0323  ALBUMIN  2.2* 2.2*   BNP (last 3 results) Recent Labs    09/21/23 0806  BNP 128.5*   Medications:    Scheduled Medications:  (feeding supplement) PROSource Plus  30 mL Oral TID WC   alteplase   2 mg Intracatheter Once   aspirin  EC  81 mg Oral Daily   atorvastatin   80 mg Oral Daily   bisacodyl   10 mg Oral Daily   Or   bisacodyl   10 mg Rectal Daily   carvedilol   3.125 mg Oral BID WC   Chlorhexidine  Gluconate Cloth  6 each Topical Q0600   clopidogrel   75 mg Oral Daily   docusate sodium   200 mg Oral Daily   feeding supplement  1 Container Oral TID BM   heparin  injection (subcutaneous)  5,000 Units Subcutaneous Q8H   insulin  aspart  0-9 Units Subcutaneous TID WC   insulin  aspart  5 Units Subcutaneous TID WC   insulin  glargine-yfgn  10 Units Subcutaneous Daily   isosorbide-hydrALAZINE   0.5 tablet Oral TID   multivitamin  1 tablet Oral QHS   traZODone   50 mg Oral QHS   Infusions:  albumin  human     anticoagulant sodium citrate      PRN Medications: alteplase , anticoagulant sodium citrate, dextrose , heparin , levalbuterol , lidocaine  (PF), lidocaine -prilocaine, ondansetron  (ZOFRAN ) IV, mouth rinse, oxyCODONE , pentafluoroprop-tetrafluoroeth, phenol, traMADol   Assessment/Plan  1. Acute systolic CHF/Cardiogenic shock: Ischemic cardiomyopathy. Echo pre-CABG with EF 35-40%, RV hard to visualize. Difficulty separating from cardiopulmonary bypass following CABG 09/27/23. Placed on low dose milrinone  0.125 for RV support, now off.  - Echo 09/30/23 EF 25-30%  - Now stable off inotropes.  - Renal following, dialysis catheter removed. May eventually need diuretics. - continue carvedilol  3.125 mg BID - Continue bidil 1/2 tab TID - Not candidate for ARB/ARNI/MRA or SGLT2i yet with renal failure. - Will not push GDMT too aggressively yet with AKI  2. CAD: NSTEMI. Hx PCI to LAD in 2005.   NSTEMI 09/21/23 with multivessel disease on LHC. S/p CABG X 4 (LIMA to LAD, SVG to OM1, sequential to OM3, SVG to PDA) on 09/27/23 - No s/s angina - Continue ASA + plavix  75 mg daily - Continue atorva 80. LDL 121 (not adherent with home meds prior to admit)  3. Acute Renal Failure: CKD 3 (bl Scr 1.3-1.6). Admission creatinine was 1.9. Creatinine peaked above 4. Post op AKI d/t ischemic ATN in setting of difficulty weaning from cardiopulmonary bypass. Started on CRRT for clearance and volume removal. - Now with signs of renal recovery. Off CVVHD. Dialysis catheter has been removed. Nephrology has been following.  4. Anemia: Post-op, hgb stable at 8.5  5. DM II: Uncontrolled, A1c 11.3% - Insulin  per TCTS  6. Physical deconditioning - Struggling with balance - PT has now updated recs to [facility] PT. Will no longer require CIR at discharge.   SDOH:  History of noncompliance with medications. Orders placed for Paramedicine referral. Hopefully this will help with medication adherence.  Has follow-up in HF clinic scheduled.   Length of Stay: 20   FINCH, LINDSAY N, PA-C 10/11/2023, 6:59 AM  Advanced Heart Failure Team Pager [phone] (M-F; 7a - 5p)   Patient seen and examined with the above-signed Advanced Practice Provider and/or Housestaff. I personally reviewed laboratory data, imaging studies and relevant notes. I independently examined the patient and formulated the important aspects of the plan. I have edited the note to reflect any of my changes or salient points. I have personally discussed the plan with the patient and/or family.  Doing well. No CP or SOB. Ambulating. Renal function seems to be recovering  General:  Well appearing. No resp difficulty HEENT: normal Neck: supple. no JVD. Carotids 2+ bilat; no bruits. No lymphadenopathy or thryomegaly appreciated. Cor: PMI nondisplaced. Regular rate & rhythm. No rubs, gallops or murmurs. Lungs: clear Abdomen: soft, nontender,  nondistended. No hepatosplenomegaly. No bruits or masses. Good bowel sounds. Extremities: no cyanosis, clubbing, rash, edema Neuro: alert & orientedx3, cranial nerves grossly intact. moves all 4 extremities w/o difficulty. Affect pleasant  Continue current regimen. Can increase bidil to 1 tab tid if SBP remains conssitently > 130. Will not puch much lower given recovering AKI.   Jules Oar, MD  11:02 AM

## 2023-10-12 ENCOUNTER — Telehealth (HOSPITAL_COMMUNITY): Payer: Self-pay | Admitting: Pharmacy Technician

## 2023-10-12 ENCOUNTER — Other Ambulatory Visit (HOSPITAL_COMMUNITY): Payer: Self-pay

## 2023-10-12 ENCOUNTER — Ambulatory Visit: Admitting: Student

## 2023-10-12 DIAGNOSIS — I214 Non-ST elevation (NSTEMI) myocardial infarction: Secondary | ICD-10-CM | POA: Diagnosis not present

## 2023-10-12 LAB — RENAL FUNCTION PANEL
Albumin: 2.1 g/dL — ABNORMAL LOW (ref 3.5–5.0)
Anion gap: 7 (ref 5–15)
BUN: 60 mg/dL — ABNORMAL HIGH (ref 8–23)
CO2: 26 mmol/L (ref 22–32)
Calcium: 8.3 mg/dL — ABNORMAL LOW (ref 8.9–10.3)
Chloride: 105 mmol/L (ref 98–111)
Creatinine, Ser: 2.77 mg/dL — ABNORMAL HIGH (ref 0.61–1.24)
GFR, Estimated: 23 mL/min — ABNORMAL LOW (ref >60)
Glucose, Bld: 200 mg/dL — ABNORMAL HIGH (ref 70–99)
Phosphorus: 4.1 mg/dL (ref 2.5–4.6)
Potassium: 3.8 mmol/L (ref 3.5–5.1)
Sodium: 138 mmol/L (ref 135–145)

## 2023-10-12 LAB — GLUCOSE, CAPILLARY
Glucose-Capillary: 176 mg/dL — ABNORMAL HIGH (ref 70–99)
Glucose-Capillary: 162 mg/dL — ABNORMAL HIGH (ref 70–99)
Glucose-Capillary: 177 mg/dL — ABNORMAL HIGH (ref 70–99)
Glucose-Capillary: 265 mg/dL — ABNORMAL HIGH (ref 70–99)
Glucose-Capillary: 216 mg/dL — ABNORMAL HIGH (ref 70–99)

## 2023-10-12 LAB — HEMOGLOBIN AND HEMATOCRIT, BLOOD
HCT: 29.8 % — ABNORMAL LOW (ref 39.0–52.0)
Hemoglobin: 9.6 g/dL — ABNORMAL LOW (ref 13.0–17.0)

## 2023-10-12 LAB — CBC
HCT: 24.6 % — ABNORMAL LOW (ref 39.0–52.0)
Hemoglobin: 7.6 g/dL — ABNORMAL LOW (ref 13.0–17.0)
MCH: 27.9 pg (ref 26.0–34.0)
MCHC: 30.9 g/dL (ref 30.0–36.0)
MCV: 90.4 fL (ref 80.0–100.0)
Platelets: 418 10*3/uL — ABNORMAL HIGH (ref 150–400)
RBC: 2.72 MIL/uL — ABNORMAL LOW (ref 4.22–5.81)
RDW: 14.3 % (ref 11.5–15.5)
WBC: 11.7 10*3/uL — ABNORMAL HIGH (ref 4.0–10.5)
nRBC: 0 % (ref 0.0–0.2)

## 2023-10-12 LAB — IRON AND TIBC
Iron: 33 ug/dL — ABNORMAL LOW (ref 45–182)
Saturation Ratios: 13 % — ABNORMAL LOW (ref 17.9–39.5)
TIBC: 253 ug/dL (ref 250–450)
UIBC: 220 ug/dL

## 2023-10-12 LAB — MAGNESIUM: Magnesium: 2.3 mg/dL (ref 1.7–2.4)

## 2023-10-12 LAB — FERRITIN: Ferritin: 212 ng/mL (ref 24–336)

## 2023-10-12 LAB — OCCULT BLOOD X 1 CARD TO LAB, STOOL: Fecal Occult Bld: NEGATIVE

## 2023-10-12 LAB — PREPARE RBC (CROSSMATCH)

## 2023-10-12 MED ORDER — ISOSORB DINITRATE-HYDRALAZINE 20-37.5 MG PO TABS
1.0000 | ORAL_TABLET | Freq: Three times a day (TID) | ORAL | Status: DC
  Administered 2023-10-12 (×3): 1 via ORAL
  Filled 2023-10-12 (×3): qty 1

## 2023-10-12 MED ORDER — POTASSIUM CHLORIDE CRYS ER 20 MEQ PO TBCR
20.0000 meq | EXTENDED_RELEASE_TABLET | Freq: Every day | ORAL | Status: DC
  Administered 2023-10-13: 20 meq via ORAL
  Filled 2023-10-12: qty 1

## 2023-10-12 MED ORDER — SODIUM CHLORIDE 0.9% IV SOLUTION: Freq: Once | INTRAVENOUS | Status: AC

## 2023-10-12 MED ORDER — FERROUS GLUCONATE 324 (38 FE) MG PO TABS
324.0000 mg | ORAL_TABLET | Freq: Every day | ORAL | Status: DC
  Administered 2023-10-13: 324 mg via ORAL
  Filled 2023-10-12: qty 1

## 2023-10-12 MED ORDER — FUROSEMIDE 10 MG/ML IJ SOLN
80.0000 mg | Freq: Once | INTRAMUSCULAR | Status: AC
  Administered 2023-10-12: 80 mg via INTRAVENOUS
  Filled 2023-10-12: qty 8

## 2023-10-12 MED ORDER — TORSEMIDE 20 MG PO TABS
40.0000 mg | ORAL_TABLET | Freq: Every day | ORAL | Status: DC
  Filled 2023-10-12: qty 2

## 2023-10-12 NOTE — Progress Notes (Addendum)
 [address]       [place] [address]             [phone]      15 Days Post-Op Procedure(s) (LRB): CORONARY ARTERY BYPASS GRAFTING (CABG) TIMES FOUR UTILIZING THE LEFT INTERNAL MAMMARY ARTERY AND ENDOSCOPIC VEIN HARVEST RIGHT GREATER SAPHENOUS VEIN (N/A) ECHOCARDIOGRAM, TRANSESOPHAGEAL, INTRAOPERATIVE (N/A) Subjective: Patient lying in bed eating breakfast, no new complaints this AM  Objective: Vital signs in last 24 hours: Temp:  [98.2 F (36.8 C)-98.8 F (37.1 C)] 98.7 F (37.1 C) (06/19 0202) Pulse Rate:  [87-92] 91 (06/19 0500) Cardiac Rhythm: Normal sinus rhythm (06/18 2345) Resp:  [18-20] 20 (06/19 0202) BP: (128-145)/(66-74) 132/74 (06/19 0202) SpO2:  [95 %-100 %] 100 % (06/19 0500) Weight:  [89.8 kg] 89.8 kg (06/19 0500)  Hemodynamic parameters for last 24 hours:    Intake/Output from previous day: 06/18 0701 - 06/19 0700 In: 420 [P.O.:420] Out: 650 [Urine:650] Intake/Output this shift: No intake/output data recorded.  General appearance: alert, cooperative, and no distress Neurologic: intact Heart: sinus tachycardia, no murmur Lungs: clear to auscultation bilaterally Abdomen: soft, non-tender; bowel sounds normal; no masses,  no organomegaly Extremities: extremities normal, atraumatic, no cyanosis or edema Wound: Clean and dry without sign of infection  Lab Results: Recent Labs    10/10/23 0819 10/12/23 0325  WBC 13.4* 11.7*  HGB 8.5* 7.6*  HCT 27.4* 24.6*  PLT 431* 418*   BMET:  Recent Labs    10/11/23 0323 10/12/23 0325  NA 140 138  K 4.0 3.8  CL 102 105  CO2 26 26  GLUCOSE 189* 200*  BUN 64* 60*  CREATININE 2.69* 2.77*  CALCIUM  8.6* 8.3*    PT/INR: No results for input(s): LABPROT, INR in the last 72 hours. ABG    Component Value Date/Time   PHART 7.336 (L) 09/28/2023 0854   HCO3 20.5 09/28/2023 0854   TCO2 22 09/28/2023 0854   ACIDBASEDEF 5.0 (H) 09/28/2023 0854   O2SAT 60.4 10/10/2023 0753   CBG  (last 3)  Recent Labs    10/11/23 1623 10/11/23 2113 10/12/23 0545  GLUCAP 185* 276* 176*    Assessment/Plan: S/P Procedure(s) (LRB): CORONARY ARTERY BYPASS GRAFTING (CABG) TIMES FOUR UTILIZING THE LEFT INTERNAL MAMMARY ARTERY AND ENDOSCOPIC VEIN HARVEST RIGHT GREATER SAPHENOUS VEIN (N/A) ECHOCARDIOGRAM, TRANSESOPHAGEAL, INTRAOPERATIVE (N/A)  CV: Hx of NSTEMI, on Plavix . HFrEF, on Coreg  3.125mg  BID. Off milrinone , coox stable. AHF assisting with titration of GDMT as able but restricted with renal failure. NSR, HR 80s-90s, some PVCs. SBP 130s-140s, AHF started Bidil 1/2 tab TID. May increase to 1 tablet TID with improved blood pressure vs may need to increase Coreg  with sinus tachycardia (was on Coreg  25mg  BID prior to admission). ST elevation on tele but patient is without chest pain and no murmur heard. EKG yesterday with LVH, nonspecific intraventricular block, anteroseptal infarct. Does not look much different from prior EKG.    Pulm: Saturating well on RA this AM. Last CXR with bibasilar atelectasis and small left pleural effusion. Encourage IS, flutter valve and ambulation. Continue zoponex neb as needed.   GI: 1 soft BM last night, states it was not diarrhea. Tolerating a diet. No nausea/vomiting.    Endo: Uncontrolled T2DM, preop A1C 11.3. CBGs 185/276/176 on Semglee  12U daily, Novolog  5U TID with meals and SSI.   Renal: AKI on CKD stage 3 baseline Cr 1.3-1.6 with postoperative renal failure, off CRRT. Nephrology deferred iHD, removed dialysis catheter and  have signed off with outpatient appointment. Cr 2.77 slightly increased from 2.69 yesterday. UO 650cc/24hrs recorded. Under preop weight but +1lb from yesterday. May need to discharge on diuretics.   ID: Leukocytosis, WBC 11.7, trending down since removal of nontunneled dialysis catheter. Afebrile. Clinically monitor.    Expected postop ABLA: Last H/H 7.6/24.6, trending down since 06/17, H/H 8.5/27.4. No clinical signs of bleeding  but nearing transfusion threshold. May benefit from 1U PRBCs. Will check hemoccult and iron studies.   DVT Prophylaxis: On heparin  injection 5000U Q8H.    Deconditioning: PT/OT now recommending HH, CIR has signed off. Continue work with PT/OT.    Dispo: Will discuss discharge plans with surgeon and AHF team. Hopefully can discharge by the end of the week.     LOS: 21 days    Angela Barban, PA-C 10/12/2023  Patient seen and examined, agree with findings and plan outlined above Transfused 1 unit PRBC, will repeat CBC in AM- if Hgb ok will dc home  Pacey Altizer C. [name], MD [facility] [phone]

## 2023-10-12 NOTE — Progress Notes (Signed)
 Mobility Specialist Progress Note:    10/12/23 0838  Mobility  Activity Ambulated with assistance in hallway;Ambulated with assistance to bathroom;Ambulated with assistance in room  Level of Assistance Standby assist, set-up cues, supervision of patient - no hands on  Assistive Device Four wheel walker  Distance Ambulated (ft) 400 ft  RUE Weight Bearing Per Provider Order NWB  LUE Weight Bearing Per Provider Order NWB  Activity Response Tolerated well  Mobility Referral Yes  Mobility visit 1 Mobility  Mobility Specialist Start Time (ACUTE ONLY) [id number]  Mobility Specialist Stop Time (ACUTE ONLY) 0849  Mobility Specialist Time Calculation (min) (ACUTE ONLY) 11 min   Pt received in bed, agreeable to mobility session. Ambulated in hallway with 4WW. SBA for safety. Tolerated well, asx throughout. Max HR 108 bpm. Returned pt to room, assisted to bathroom. Encouraged to use call light when finished.  Eshani Maestre Mobility Specialist Please contact via Special educational needs teacher or  Rehab office at [phone]

## 2023-10-12 NOTE — Telephone Encounter (Signed)
 Pharmacy Patient Advocate Encounter  Insurance verification completed.    The patient is insured through [employer]. Patient has Medicare and is not eligible for a copay card, but may be able to apply for patient assistance or Medicare RX Payment Plan (Patient Must reach out to their plan, if eligible for payment plan), if available.    Ran test claim for Bidil tablets and the current 30 day co-pay is $89.40. Copay applied to the $250.00 deductible. It does not give copay after the deductible is met.    This test claim was processed through [facility]- copay amounts may vary at other pharmacies due to pharmacy/plan contracts, or as the patient moves through the different stages of their insurance plan.

## 2023-10-12 NOTE — Progress Notes (Addendum)
 Patient ID: [name], male   DOB: 1950/08/02, 73 y.o.   MRN: [id number]     Advanced Heart Failure Rounding Note  Cardiologist: Hazle Lites, MD   Chief Complaint: Post cardiotomy shock  Patient Profile   73 y.o. male with history of CAD, HFmrEF, uncontrolled DM II, HTN, HLD. Admitted with NSTEMI and acute on chronic CHF. Underwent CABG X 4 on 09/27/23 c/b postcardiotomy shock.  Subjective:    06/04: CABG X 4 with Dr. [name]. Difficulty separating from cardiopulmonary bypass 06/05: Extubated   POD #14  Scr stable 2.77.  Continues to make urine. No further HD planned per Nephrology.   SBP elevated 130s-140s.  Hgb drifting down, 7.6 today.   No shortness of breath. Feels great. Wants to go home.  Objective:    Weight Range: 89.8 kg Body mass index is 33.99 kg/m.   Vital Signs:   Temp:  [98.2 F (36.8 C)-98.8 F (37.1 C)] 98.5 F (36.9 C) (06/19 0725) Pulse Rate:  [87-97] 97 (06/19 0725) Resp:  [18-20] 18 (06/19 0725) BP: (128-146)/(66-87) 146/87 (06/19 0725) SpO2:  [95 %-100 %] 100 % (06/19 0725) Weight:  [89.8 kg] 89.8 kg (06/19 0500) Last BM Date : 10/10/23  Weight change: Filed Weights   10/10/23 0258 10/11/23 0411 10/12/23 0500  Weight: 89.4 kg 89.6 kg 89.8 kg   Intake/Output:  Intake/Output Summary (Last 24 hours) at 10/12/2023 0808 Last data filed at 10/12/2023 0729 Gross per 24 hour  Intake 660 ml  Output 450 ml  Net 210 ml    Physical Exam   General:  Well appearing. Neck: JVP to midneck Cor: Regular rate & rhythm. No rubs, gallops or murmurs. Lungs: clear Abdomen: obese, nontender, nondistended.  Extremities: 1+ edema Neuro: alert & orientedx3. Affect pleasant    Telemetry: SR 80s-90s  Labs    CBC Recent Labs    10/10/23 0819 10/12/23 0325  WBC 13.4* 11.7*  HGB 8.5* 7.6*  HCT 27.4* 24.6*  MCV 90.7 90.4  PLT 431* 418*   Basic Metabolic Panel Recent Labs    78/29/56 0323 10/12/23 0325  NA 140 138  K 4.0 3.8  CL  102 105  CO2 26 26  GLUCOSE 189* 200*  BUN 64* 60*  CREATININE 2.69* 2.77*  CALCIUM  8.6* 8.3*  MG 2.3 2.3  PHOS 4.1 4.1   Liver Function Tests Recent Labs    10/11/23 0323 10/12/23 0325  ALBUMIN  2.2* 2.1*   BNP (last 3 results) Recent Labs    09/21/23 0806  BNP 128.5*   Medications:    Scheduled Medications:  alteplase   2 mg Intracatheter Once   aspirin  EC  81 mg Oral Daily   atorvastatin   80 mg Oral Daily   bisacodyl   10 mg Oral Daily   Or   bisacodyl   10 mg Rectal Daily   carvedilol   3.125 mg Oral BID WC   Chlorhexidine  Gluconate Cloth  6 each Topical Q0600   clopidogrel   75 mg Oral Daily   docusate sodium   200 mg Oral Daily   feeding supplement (GLUCERNA SHAKE)  237 mL Oral TID BM   heparin  injection (subcutaneous)  5,000 Units Subcutaneous Q8H   insulin  aspart  0-9 Units Subcutaneous TID WC   insulin  aspart  5 Units Subcutaneous TID WC   insulin  glargine-yfgn  12 Units Subcutaneous Daily   isosorbide-hydrALAZINE   0.5 tablet Oral TID   multivitamin  1 tablet Oral QHS   traZODone   50 mg Oral QHS   Infusions:  albumin  human     anticoagulant sodium citrate      PRN Medications: alteplase , anticoagulant sodium citrate, dextrose , heparin , levalbuterol , lidocaine  (PF), lidocaine -prilocaine, ondansetron  (ZOFRAN ) IV, mouth rinse, oxyCODONE , pentafluoroprop-tetrafluoroeth, phenol, traMADol   Assessment/Plan  1. Acute systolic CHF/Cardiogenic shock: Ischemic cardiomyopathy. Echo pre-CABG with EF 35-40%, RV hard to visualize. Difficulty separating from cardiopulmonary bypass following CABG 09/27/23. Placed on low dose milrinone  0.125 for RV support, now off.  - Echo 09/30/23 EF 25-30%  - Now stable off inotropes.  - appears volume up today. Making urine and no longer requiring HD. Dialysis cathter removed. Will give 80 mg lasix  IV with unit of RBCs. Start 40 mg Torsemide daily and 20 mEq KCL daily on 06/20. - continue carvedilol  3.125 mg BID - increase bidil to 1 tab  TID - Not candidate for ARB/ARNI/MRA or SGLT2i yet with renal failure. - Will not push GDMT too aggressively yet with AKI  2. CAD: NSTEMI. Hx PCI to LAD in 2005.  NSTEMI 09/21/23 with multivessel disease on LHC. S/p CABG X 4 (LIMA to LAD, SVG to OM1, sequential to OM3, SVG to PDA) on 09/27/23 - No s/s angina - Continue ASA + plavix  75 mg daily - Continue atorva 80. LDL 121 (not adherent with home meds prior to admit)  3. Acute Renal Failure: CKD 3 (bl Scr 1.3-1.6). Admission creatinine was 1.9. Creatinine peaked above 4. Post op AKI d/t ischemic ATN in setting of difficulty weaning from cardiopulmonary bypass. Started on CRRT for clearance and volume removal. - Now with signs of renal recovery. Off CVVHD. Dialysis catheter has been removed. Nephrology plans to follow outpatient  4. Anemia: Post-op blood loss -Hgb trending down over the last week. 9.8>>7.6 today. -Give 1 u RBCs  5. DM II: Uncontrolled, A1c 11.3% - Insulin  per TCTS  6. Physical deconditioning - PT has now updated recs to [facility] PT. Will no longer require CIR at discharge.   SDOH:  History of noncompliance with medications. Orders placed for Paramedicine referral. Hopefully this will help with medication adherence.   Okay for discharge from HF standpoint once he gets transfusion. Has f/u scheduled.  HF Discharge medications: Aspirin  81 mg daily Atorvastatin  80 mg daily Coreg  3.125 mg BID Plavix  75 mg daily Bidil 1 tab TID Torsemide 40 mg daily (starting 6/20) Potassium chloride  20 mEq daily (starting 6/20)  Length of Stay: 21   FINCH, LINDSAY N, PA-C 10/12/2023, 8:08 AM  Advanced Heart Failure Team Pager [phone] (M-F; 7a - 5p)    Patient seen and examined with the above-signed Advanced Practice Provider and/or Housestaff. I personally reviewed laboratory data, imaging studies and relevant notes. I independently examined the patient and formulated the important aspects of the plan. I have edited the note to  reflect any of my changes or salient points. I have personally discussed the plan with the patient and/or family.  Feels good. Eager to go home. SCr has leveled off around 2.7 (baseline 1.9). Renal has signed off  Denies CP or SOB. Hgb 7.6  General:  Well appearing. No resp difficulty HEENT: normal Neck: supple. no JVD. Carotids 2+ bilat; no bruits. No lymphadenopathy or thryomegaly appreciated. Cor: PMI nondisplaced. Regular rate & rhythm. No rubs, gallops or murmurs. Lungs: clear Abdomen: soft, nontender, nondistended. No hepatosplenomegaly. No bruits or masses. Good bowel sounds. Extremities: no cyanosis, clubbing, rash, edema Neuro: alert & orientedx3, cranial nerves grossly intact. moves all 4 extremities w/o difficulty. Affect pleasant  Wil give 1u RBCs and 80 IV lasix  today.  Start torsemide 40 daily. Continue bidil and carvedilol . No ARNI/ARB/SGLT2i?MRA with CKD.   Otherwise ok for d/c today with close HF f/u  Jules Oar, MD  10:47 AM

## 2023-10-13 ENCOUNTER — Other Ambulatory Visit (HOSPITAL_COMMUNITY): Payer: Self-pay

## 2023-10-13 DIAGNOSIS — I214 Non-ST elevation (NSTEMI) myocardial infarction: Secondary | ICD-10-CM | POA: Diagnosis not present

## 2023-10-13 LAB — BPAM RBC
Blood Product Expiration Date: 202507112359
ISSUE DATE / TIME: 202506191303
Unit Type and Rh: 5100

## 2023-10-13 LAB — RENAL FUNCTION PANEL
Albumin: 2.2 g/dL — ABNORMAL LOW (ref 3.5–5.0)
Anion gap: 7 (ref 5–15)
BUN: 52 mg/dL — ABNORMAL HIGH (ref 8–23)
CO2: 27 mmol/L (ref 22–32)
Calcium: 8.2 mg/dL — ABNORMAL LOW (ref 8.9–10.3)
Chloride: 105 mmol/L (ref 98–111)
Creatinine, Ser: 2.51 mg/dL — ABNORMAL HIGH (ref 0.61–1.24)
GFR, Estimated: 26 mL/min — ABNORMAL LOW (ref >60)
Glucose, Bld: 163 mg/dL — ABNORMAL HIGH (ref 70–99)
Phosphorus: 3.7 mg/dL (ref 2.5–4.6)
Potassium: 3.8 mmol/L (ref 3.5–5.1)
Sodium: 139 mmol/L (ref 135–145)

## 2023-10-13 LAB — TYPE AND SCREEN
ABO/RH(D): O POS
Antibody Screen: NEGATIVE
Unit division: 0

## 2023-10-13 LAB — CBC
HCT: 27.5 % — ABNORMAL LOW (ref 39.0–52.0)
Hemoglobin: 8.8 g/dL — ABNORMAL LOW (ref 13.0–17.0)
MCH: 28.6 pg (ref 26.0–34.0)
MCHC: 32 g/dL (ref 30.0–36.0)
MCV: 89.3 fL (ref 80.0–100.0)
Platelets: 412 10*3/uL — ABNORMAL HIGH (ref 150–400)
RBC: 3.08 MIL/uL — ABNORMAL LOW (ref 4.22–5.81)
RDW: 14.2 % (ref 11.5–15.5)
WBC: 11.5 10*3/uL — ABNORMAL HIGH (ref 4.0–10.5)
nRBC: 0 % (ref 0.0–0.2)

## 2023-10-13 LAB — MAGNESIUM: Magnesium: 2.3 mg/dL (ref 1.7–2.4)

## 2023-10-13 LAB — GLUCOSE, CAPILLARY
Glucose-Capillary: 163 mg/dL — ABNORMAL HIGH (ref 70–99)
Glucose-Capillary: 255 mg/dL — ABNORMAL HIGH (ref 70–99)

## 2023-10-13 MED ORDER — HYDRALAZINE HCL 50 MG PO TABS
50.0000 mg | ORAL_TABLET | Freq: Three times a day (TID) | ORAL | 1 refills | Status: DC
  Filled 2023-10-13: qty 90, 30d supply, fill #0

## 2023-10-13 MED ORDER — POTASSIUM CHLORIDE CRYS ER 20 MEQ PO TBCR: 40.0000 meq | EXTENDED_RELEASE_TABLET | Freq: Every day | ORAL | Status: DC

## 2023-10-13 MED ORDER — POTASSIUM CHLORIDE CRYS ER 20 MEQ PO TBCR
20.0000 meq | EXTENDED_RELEASE_TABLET | Freq: Once | ORAL | Status: AC
  Administered 2023-10-13: 20 meq via ORAL
  Filled 2023-10-13: qty 1

## 2023-10-13 MED ORDER — POTASSIUM CHLORIDE CRYS ER 20 MEQ PO TBCR
40.0000 meq | EXTENDED_RELEASE_TABLET | Freq: Every day | ORAL | 1 refills | Status: DC
  Filled 2023-10-13: qty 60, 30d supply, fill #0

## 2023-10-13 MED ORDER — ISOSORBIDE MONONITRATE ER 30 MG PO TB24
30.0000 mg | ORAL_TABLET | Freq: Every day | ORAL | 1 refills | Status: DC
  Filled 2023-10-13: qty 30, 30d supply, fill #0

## 2023-10-13 MED ORDER — IRON SUCROSE 300 MG IVPB - SIMPLE MED
300.0000 mg | Freq: Once | Status: AC
  Administered 2023-10-13: 300 mg via INTRAVENOUS
  Filled 2023-10-13: qty 300

## 2023-10-13 MED ORDER — TORSEMIDE 20 MG PO TABS
40.0000 mg | ORAL_TABLET | Freq: Every day | ORAL | 1 refills | Status: DC
Start: 2023-10-13 — End: 2023-10-13
  Filled 2023-10-13: qty 60, 30d supply, fill #0

## 2023-10-13 MED ORDER — HYDRALAZINE HCL 25 MG PO TABS
37.5000 mg | ORAL_TABLET | Freq: Three times a day (TID) | ORAL | 2 refills | Status: DC
  Filled 2023-10-13: qty 120, 27d supply, fill #0

## 2023-10-13 MED ORDER — TORSEMIDE 20 MG PO TABS
60.0000 mg | ORAL_TABLET | Freq: Every day | ORAL | Status: DC
  Administered 2023-10-13: 60 mg via ORAL
  Filled 2023-10-13: qty 3

## 2023-10-13 MED ORDER — HYDRALAZINE HCL 50 MG PO TABS
50.0000 mg | ORAL_TABLET | Freq: Three times a day (TID) | ORAL | 1 refills | Status: DC
  Filled 2023-10-13: qty 30, 10d supply, fill #0

## 2023-10-13 MED ORDER — TRAMADOL HCL 50 MG PO TABS
50.0000 mg | ORAL_TABLET | Freq: Four times a day (QID) | ORAL | 0 refills | Status: DC | PRN
  Filled 2023-10-13: qty 28, 7d supply, fill #0

## 2023-10-13 MED ORDER — TORSEMIDE 20 MG PO TABS
60.0000 mg | ORAL_TABLET | Freq: Every day | ORAL | 1 refills | Status: DC
  Filled 2023-10-13: qty 90, 30d supply, fill #0

## 2023-10-13 MED ORDER — FE FUM-VIT C-VIT B12-FA 460-60-0.01-1 MG PO CAPS
1.0000 | ORAL_CAPSULE | Freq: Every day | ORAL | Status: DC
Start: 2023-10-13 — End: 2023-10-16

## 2023-10-13 MED ORDER — CLOPIDOGREL BISULFATE 75 MG PO TABS
75.0000 mg | ORAL_TABLET | Freq: Every day | ORAL | 1 refills | Status: DC
Start: 2023-10-13 — End: 2023-12-13
  Filled 2023-10-13: qty 60, 60d supply, fill #0

## 2023-10-13 MED ORDER — POTASSIUM CHLORIDE CRYS ER 20 MEQ PO TBCR
20.0000 meq | EXTENDED_RELEASE_TABLET | Freq: Every day | ORAL | 1 refills | Status: DC
  Filled 2023-10-13: qty 30, 30d supply, fill #0

## 2023-10-13 MED ORDER — HYDRALAZINE HCL 50 MG PO TABS
50.0000 mg | ORAL_TABLET | Freq: Three times a day (TID) | ORAL | Status: DC
  Administered 2023-10-13: 50 mg via ORAL
  Filled 2023-10-13: qty 1

## 2023-10-13 MED ORDER — ISOSORBIDE MONONITRATE ER 30 MG PO TB24
30.0000 mg | ORAL_TABLET | Freq: Every day | ORAL | Status: DC
  Administered 2023-10-13: 30 mg via ORAL
  Filled 2023-10-13: qty 1

## 2023-10-13 MED ORDER — ATORVASTATIN CALCIUM 80 MG PO TABS
80.0000 mg | ORAL_TABLET | Freq: Every day | ORAL | 1 refills | Status: DC
Start: 2023-10-13 — End: 2023-12-13
  Filled 2023-10-13: qty 60, 60d supply, fill #0

## 2023-10-13 MED ORDER — CARVEDILOL 3.125 MG PO TABS
3.1250 mg | ORAL_TABLET | Freq: Two times a day (BID) | ORAL | 1 refills | Status: DC
  Filled 2023-10-13: qty 60, 30d supply, fill #0

## 2023-10-13 NOTE — Progress Notes (Addendum)
 Patient ID: [name], male   DOB: 1950-06-25, 73 y.o.   MRN: [id number]     Advanced Heart Failure Rounding Note  Cardiologist: Hazle Lites, MD   Chief Complaint: Post cardiotomy shock  Patient Profile   73 y.o. male with history of CAD, HFmrEF, uncontrolled DM II, HTN, HLD. Admitted with NSTEMI and acute on chronic CHF. Underwent CABG X 4 on 09/27/23 c/b postcardiotomy shock.  Subjective:    06/04: CABG X 4 with Dr. [name]. Difficulty separating from cardiopulmonary bypass 06/05: Extubated   POD #15  Scr further improved to 2.5  Hgb up to 8.8 after 1 u RBCs.   Objective:    Weight Range: 88.7 kg Body mass index is 33.56 kg/m.   Vital Signs:   Temp:  [97.8 F (36.6 C)-98.5 F (36.9 C)] 98.4 F (36.9 C) (06/20 0737) Pulse Rate:  [85-95] 90 (06/20 0500) Resp:  [18-20] 20 (06/20 0326) BP: (117-139)/(61-78) 136/70 (06/20 0326) SpO2:  [95 %-98 %] 96 % (06/20 0500) Weight:  [88.7 kg] 88.7 kg (06/20 0500) Last BM Date : 10/11/23  Weight change: Filed Weights   10/11/23 0411 10/12/23 0500 10/13/23 0500  Weight: 89.6 kg 89.8 kg 88.7 kg   Intake/Output:  Intake/Output Summary (Last 24 hours) at 10/13/2023 0811 Last data filed at 10/12/2023 2320 Gross per 24 hour  Intake 586 ml  Output 1600 ml  Net -1014 ml    Physical Exam   General:  Well appearing.  Neck: JVP to midneck Cor: Regular rate & rhythm. No rubs, gallops or murmurs. Lungs: clear Abdomen: soft, nontender, nondistended.  Extremities: trace edema Neuro: alert & orientedx3. Affect pleasant   Telemetry: SR 90s  Labs    CBC Recent Labs    10/12/23 0325 10/12/23 1944 10/13/23 0258  WBC 11.7*  --  11.5*  HGB 7.6* 9.6* 8.8*  HCT 24.6* 29.8* 27.5*  MCV 90.4  --  89.3  PLT 418*  --  412*   Basic Metabolic Panel Recent Labs    96/29/52 0325 10/13/23 0258  NA 138 139  K 3.8 3.8  CL 105 105  CO2 26 27  GLUCOSE 200* 163*  BUN 60* 52*  CREATININE 2.77* 2.51*  CALCIUM  8.3* 8.2*   MG 2.3 2.3  PHOS 4.1 3.7   Liver Function Tests Recent Labs    10/12/23 0325 10/13/23 0258  ALBUMIN  2.1* 2.2*   BNP (last 3 results) Recent Labs    09/21/23 0806  BNP 128.5*   Medications:    Scheduled Medications:  aspirin  EC  81 mg Oral Daily   atorvastatin   80 mg Oral Daily   bisacodyl   10 mg Oral Daily   Or   bisacodyl   10 mg Rectal Daily   carvedilol   3.125 mg Oral BID WC   Chlorhexidine  Gluconate Cloth  6 each Topical Q0600   clopidogrel   75 mg Oral Daily   docusate sodium   200 mg Oral Daily   feeding supplement (GLUCERNA SHAKE)  237 mL Oral TID BM   ferrous gluconate  324 mg Oral Q breakfast   heparin  injection (subcutaneous)  5,000 Units Subcutaneous Q8H   insulin  aspart  0-9 Units Subcutaneous TID WC   insulin  aspart  5 Units Subcutaneous TID WC   insulin  glargine-yfgn  12 Units Subcutaneous Daily   isosorbide-hydrALAZINE   1 tablet Oral TID   multivitamin  1 tablet Oral QHS   potassium chloride   20 mEq Oral Daily   torsemide  40 mg Oral Daily  traZODone   50 mg Oral QHS   Infusions:  anticoagulant sodium citrate     iron sucrose      PRN Medications: alteplase , anticoagulant sodium citrate, dextrose , heparin , levalbuterol , lidocaine  (PF), lidocaine -prilocaine, ondansetron  (ZOFRAN ) IV, mouth rinse, oxyCODONE , pentafluoroprop-tetrafluoroeth, phenol, traMADol   Assessment/Plan  1. Acute systolic CHF/Cardiogenic shock: Ischemic cardiomyopathy. Echo pre-CABG with EF 35-40%, RV hard to visualize. Difficulty separating from cardiopulmonary bypass following CABG 09/27/23. Placed on low dose milrinone  0.125 for RV support, now off.  - Echo 09/30/23 EF 25-30%  - Now stable off inotropes.  - Volume slightly elevated. Increase Torsemide to 60 mg daily and Potassium chloride  40 mEq daily - continue carvedilol  3.125 mg BID - SBP 130s. Will need to separate bidil for copay reasons. Start Imdur 30 mg daily and increase hydralazine  to 50 mg TID - Not candidate for  ARB/ARNI/MRA or SGLT2i yet with recovering AKI.  2. CAD: NSTEMI. Hx PCI to LAD in 2005.  NSTEMI 09/21/23 with multivessel disease on LHC. S/p CABG X 4 (LIMA to LAD, SVG to OM1, sequential to OM3, SVG to PDA) on 09/27/23 - No s/s angina - Continue ASA + plavix  75 mg daily - Continue atorva 80. LDL 121 (not adherent with home meds prior to admit)  3. Acute Renal Failure: CKD 3 (bl Scr 1.3-1.6). Admission creatinine was 1.9. Creatinine peaked above 4. Post op AKI d/t ischemic ATN in setting of difficulty weaning from cardiopulmonary bypass. Started on CRRT for clearance and volume removal. - Now with signs of renal recovery. Off CVVHD. Dialysis catheter has been removed. Nephrology plans to follow outpatient  4. Anemia: Post-op blood loss -1 u RBCs on 06/19 -Iron stores are low. Discussed with TCTS and PharmD. Will give IV iron prior to discharge.  5. DM II: Uncontrolled, A1c 11.3% - Insulin  per TCTS  6. Physical deconditioning - PT has now updated recs to [facility] PT. Will no longer require CIR at discharge.   SDOH:  History of noncompliance with medications. Orders placed for Paramedicine referral. Hopefully this will help with medication adherence.   Planning for discharge today after IV iron. Has f/u in HF clinic scheduled.  HF Discharge medications: Aspirin  81 mg daily Atorvastatin  80 mg daily Coreg  3.125 mg BID Plavix  75 mg daily Imdur 30 mg daily Hydralazine  50 mg TID Torsemide 60 mg daily Potassium chloride  40 mEq daily  Length of Stay: 22   FINCH, LINDSAY N, PA-C 10/13/2023, 8:11 AM  Advanced Heart Failure Team Pager [phone] (M-F; 7a - 5p)   Patient seen and examined with the above-signed Advanced Practice Provider and/or Housestaff. I personally reviewed laboratory data, imaging studies and relevant notes. I independently examined the patient and formulated the important aspects of the plan. I have edited the note to reflect any of my changes or salient points. I have  personally discussed the plan with the patient and/or family.  Got 1u RBCs yesterday. Hgb up.   Feels good. Denies CP or SOB  Scr improving  General:  Well appearing. No resp difficulty HEENT: normal Neck: supple. no JVD. Carotids 2+ bilat; no bruits. No lymphadenopathy or thryomegaly appreciated. Cor: PMI nondisplaced. Regular rate & rhythm. No rubs, gallops or murmurs. Lungs: clear Abdomen: soft, nontender, nondistended. No hepatosplenomegaly. No bruits or masses. Good bowel sounds. Extremities: no cyanosis, clubbing, rash, edema Neuro: alert & orientedx3, cranial nerves grossly intact. moves all 4 extremities w/o difficulty. Affect pleasant  Ok for d/c today on above regimen. Will arrange f/u in HF Clinic.  Jules Oar, MD  9:22 AM

## 2023-10-13 NOTE — Progress Notes (Signed)
 Sutures removed from where chest tubs were placed.  Patient tolerated fine.  No discharge, educated family and patient to watch for discharge and what to be concerned about.

## 2023-10-13 NOTE — Progress Notes (Signed)
 Occupational Therapy Treatment Patient Details Name: [name] MRN: [id number] DOB: 04-14-1951 Today's Date: 10/13/2023   History of present illness 73 y.o. male presenting 09/21/23 with chest pain and elevated troponins. 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 Patient was CGA for sit to stand with cues for use of momentum to assist. Patient performed mobility to bathroom with CGA and HHA and stood at sink for grooming tasks.  Discharge recommendations continue to be appropriate. Acute OT to continue to follow to address established goals.       If plan is discharge home, recommend the following:  A little help with walking and/or transfers;A little help with bathing/dressing/bathroom;Direct supervision/assist for financial management;Direct supervision/assist for medications management;Assist for transportation   Equipment Recommendations  BSC/3in1    Recommendations for Other Services      Precautions / Restrictions Precautions Precautions: Fall;Sternal Precaution Booklet Issued: No Recall of Precautions/Restrictions: Impaired Precaution/Restrictions Comments: able to recall sternal precautions with cues Restrictions Weight Bearing Restrictions Per Provider Order: Yes RUE Weight Bearing Per Provider Order: Non weight bearing LUE Weight Bearing Per Provider Order: Non weight bearing Other Position/Activity Restrictions: sternal       Mobility Bed Mobility Overal bed mobility: Needs Assistance Bed Mobility: Rolling, Sidelying to Sit Rolling: Supervision Sidelying to sit: Contact guard assist       General bed mobility comments: cues for precautions and CGA to lift trunk    Transfers Overall transfer  level: Needs assistance Equipment used: 1 person hand held assist Transfers: Sit to/from Stand Sit to Stand: Contact guard assist           General transfer comment: performed transfers and mobility in room with HHA and cues for precautions     Balance Overall balance assessment: Needs assistance Sitting-balance support: No upper extremity supported, Feet supported Sitting balance-Leahy Scale: Fair Sitting balance - Comments: EOB   Standing balance support: Single extremity supported, During functional activity Standing balance-Leahy Scale: Poor Standing balance comment: reliant on single extremity support for balance                           ADL either performed or assessed with clinical judgement   ADL Overall ADL's : Needs assistance/impaired     Grooming: Wash/dry hands;Wash/dry face;Supervision/safety;Standing Grooming Details (indicate cue type and reason): at sink         Upper Body Dressing : Minimal assistance;Sitting Upper Body Dressing Details (indicate cue type and reason): gown for back Lower Body Dressing: Moderate assistance Lower Body Dressing Details (indicate cue type and reason): for socks Toilet Transfer: Contact guard Armed forces operational officer Details (indicate cue type and reason): ambulated to bathroom with HHA Toileting- Clothing Manipulation and Hygiene: Modified independent;Sitting/lateral lean       Functional mobility during ADLs: Contact guard assist General ADL Comments: performed mobility in room with HHA to bathroom and sink    Extremity/Trunk Assessment              Vision       Perception     Praxis     Communication Communication Communication: Impaired Factors Affecting Communication: Hearing impaired   Cognition Arousal: Alert Behavior During Therapy: WFL for tasks assessed/performed Cognition: No  apparent impairments                               Following commands:  Intact Following commands impaired: Follows one step commands with increased time      Cueing   Cueing Techniques: Verbal cues  Exercises      Shoulder Instructions       General Comments VSS on RA    Pertinent Vitals/ Pain       Pain Assessment Pain Assessment: No/denies pain Pain Intervention(s): Monitored during session  Home Living                                          Prior Functioning/Environment              Frequency  Min 2X/week        Progress Toward Goals  OT Goals(current goals can now be found in the care plan section)  Progress towards OT goals: Progressing toward goals  Acute Rehab OT Goals Patient Stated Goal: to go home OT Goal Formulation: With patient Time For Goal Achievement: 10/16/23 Potential to Achieve Goals: Good ADL Goals Pt Will Perform Grooming: with set-up;with supervision;sitting Pt Will Perform Upper Body Bathing: with min assist;sitting Pt Will Perform Lower Body Bathing: with mod assist;sit to/from stand Pt Will Transfer to Toilet: with mod assist;bedside commode Additional ADL Goal #1: Pt will independently verbalize understanding of sternal precautions for ADL  Plan      Co-evaluation                 AM-PAC OT 6 Clicks Daily Activity     Outcome Measure   Help from another person eating meals?: None Help from another person taking care of personal grooming?: A Little Help from another person toileting, which includes using toliet, bedpan, or urinal?: A Little Help from another person bathing (including washing, rinsing, drying)?: A Lot Help from another person to put on and taking off regular upper body clothing?: A Little Help from another person to put on and taking off regular lower body clothing?: A Lot 6 Click Score: 17    End of Session Equipment Utilized During Treatment: Gait belt  OT Visit Diagnosis: Unsteadiness on feet (R26.81);Other abnormalities of gait and mobility  (R26.89);Muscle weakness (generalized) (M62.81);Other symptoms and signs involving cognitive function   Activity Tolerance Patient tolerated treatment well   Patient Left in chair;with call bell/phone within reach;with chair alarm set   Nurse Communication Mobility status        Time: 1610-9604 OT Time Calculation (min): 26 min  Charges: OT General Charges $OT Visit: 1 Visit OT Treatments $Self Care/Home Management : 23-37 mins  Anitra Barn, OTA Acute Rehabilitation Services  Office [phone]   Jovita Nipper 10/13/2023, 12:04 PM

## 2023-10-13 NOTE — Progress Notes (Signed)
 Physical Therapy Treatment Patient Details Name: [name] MRN: [id number] DOB: 17-Jun-1950 Today's Date: 10/13/2023   History of Present Illness 73 y.o. male presenting 09/21/23 with chest pain and elevated troponins. NSTEMI; 5/29 L heart cath with multivessel CAD, 6/04 CABGx4; 6/5 extubated; post-CABG cardioplegia requiring inotropic support; decr renal function with uremic encephalopathy, CRRT 6/7-6/11 PMH: DM, MI, HTN    PT Comments  Pt tolerated mobility progressions well, negotiating stairs w/out an AD and no physical assistance given. Pt appears to have difficulty maintaining sternal precautions throughout bed mobility and transfers requiring cueing for not pushing down on surface. Pt would benefit from further gait and stair training. PT will continue to treat pt while he is admitted. Recommending HHPT at discharge to address remaining mobility deficits and optimize return to PLOF.     If plan is discharge home, recommend the following: A little help with walking and/or transfers;A little help with bathing/dressing/bathroom;Assistance with cooking/housework;Assist for transportation;Help with stairs or ramp for entrance   Can travel by private vehicle        Equipment Recommendations  Rolling walker (2 wheels)    Recommendations for Other Services       Precautions / Restrictions Precautions Precautions: Fall;Sternal Precaution Booklet Issued: No Recall of Precautions/Restrictions: Impaired Precaution/Restrictions Comments: unable to recall sternal precautions at beginning of session, but is able to recall with cueing at end of session Restrictions Weight Bearing Restrictions Per Provider Order: No Other Position/Activity Restrictions: sternal precautions     Mobility  Bed Mobility Overal bed mobility: Needs Assistance Bed Mobility: Rolling, Sidelying to Sit Rolling: Supervision Sidelying to sit: Supervision       General bed mobility comments: increased time to  complete; pt requires cueing for sternal precautions    Transfers Overall transfer level: Needs assistance Equipment used: Rollator (4 wheels) Transfers: Sit to/from Stand Sit to Stand: Supervision           General transfer comment: VC given to increase forward trunk lean to aid in sit to stand with pt crossing arms over chest to keep from pushing down on bed. Increased time to complete.    Ambulation/Gait Ambulation/Gait assistance: Supervision Gait Distance (Feet): 300 Feet Assistive device: Rollator (4 wheels) Gait Pattern/deviations: Step-through pattern, Decreased stride length, Shuffle, Drifts right/left Gait velocity: reduced Gait velocity interpretation: <1.8 ft/sec, indicate of risk for recurrent falls   General Gait Details: pt demonstrates drift right/left due to external distraction requiring cueing for remaining on task.   Stairs Stairs: Yes Stairs assistance: Contact guard assist Stair Management: One rail Left, Forwards Number of Stairs: 3 General stair comments: pt demos step-to pattern and utilizes R rail for improved stability. VC given to not push-down on R railing; increased time to complete.   Wheelchair Mobility     Tilt Bed    Modified Rankin (Stroke Patients Only)       Balance Overall balance assessment: Needs assistance Sitting-balance support: No upper extremity supported, Feet supported Sitting balance-Leahy Scale: Fair     Standing balance support: During functional activity, Single extremity supported Standing balance-Leahy Scale: Fair Standing balance comment: pt completed pivot from sitting in rollator to mobilizing w/ single UE support for pivot; increased time to complete. VC given for sequencing                            Communication Communication Communication: Impaired Factors Affecting Communication: Hearing impaired  Cognition Arousal: Alert Behavior During Therapy: [facility] for tasks  assessed/performed                              Following commands: Intact Following commands impaired: Follows one step commands with increased time    Cueing Cueing Techniques: Verbal cues, Visual cues, Gestural cues  Exercises      General Comments General comments (skin integrity, edema, etc.): no signs of acute distress      Pertinent Vitals/Pain Pain Assessment Pain Assessment: No/denies pain Pain Intervention(s): Monitored during session    Home Living                          Prior Function            PT Goals (current goals can now be found in the care plan section) Acute Rehab PT Goals Patient Stated Goal: agrees he wants to get moving PT Goal Formulation: With patient Time For Goal Achievement: 10/15/23 Potential to Achieve Goals: Good Progress towards PT goals: Progressing toward goals    Frequency    Min 2X/week      PT Plan      Co-evaluation              AM-PAC PT 6 Clicks Mobility   Outcome Measure  Help needed turning from your back to your side while in a flat bed without using bedrails?: A Little Help needed moving from lying on your back to sitting on the side of a flat bed without using bedrails?: A Little Help needed moving to and from a bed to a chair (including a wheelchair)?: A Little Help needed standing up from a chair using your arms (e.g., wheelchair or bedside chair)?: A Little Help needed to walk in hospital room?: A Little Help needed climbing 3-5 steps with a railing? : A Little 6 Click Score: 18    End of Session Equipment Utilized During Treatment: Gait belt Activity Tolerance: Patient tolerated treatment well Patient left: in chair;with call bell/phone within reach;with chair alarm set Nurse Communication: Mobility status PT Visit Diagnosis: Unsteadiness on feet (R26.81);Muscle weakness (generalized) (M62.81);Difficulty in walking, not elsewhere classified (R26.2)     Time: 2130-8657 PT Time Calculation (min)  (ACUTE ONLY): 32 min  Charges:    $Gait Training: 23-37 mins PT General Charges $$ ACUTE PT VISIT: 1 Visit                     Lonell Rives, SPT Acute Rehab [phone]    Lonell Rives 10/13/2023, 1:22 PM

## 2023-10-13 NOTE — Progress Notes (Signed)
 [address]       [place] [address]             [phone]      16 Days Post-Op Procedure(s) (LRB): CORONARY ARTERY BYPASS GRAFTING (CABG) TIMES FOUR UTILIZING THE LEFT INTERNAL MAMMARY ARTERY AND ENDOSCOPIC VEIN HARVEST RIGHT GREATER SAPHENOUS VEIN (N/A) ECHOCARDIOGRAM, TRANSESOPHAGEAL, INTRAOPERATIVE (N/A) Subjective: No new complaints this AM.   Objective: Vital signs in last 24 hours: Temp:  [97.8 F (36.6 C)-98.5 F (36.9 C)] 98 F (36.7 C) (06/20 0326) Pulse Rate:  [85-97] 90 (06/20 0500) Cardiac Rhythm: Normal sinus rhythm;Supraventricular tachycardia (06/19 2201) Resp:  [18-20] 20 (06/20 0326) BP: (117-146)/(61-87) 136/70 (06/20 0326) SpO2:  [95 %-100 %] 96 % (06/20 0500) Weight:  [88.7 kg] 88.7 kg (06/20 0500)  Hemodynamic parameters for last 24 hours:    Intake/Output from previous day: 06/19 0701 - 06/20 0700 In: 826 [P.O.:480; Blood:346] Out: 1600 [Urine:1600] Intake/Output this shift: No intake/output data recorded.  General appearance: alert, cooperative, and no distress Neurologic: intact Heart: regular rate and rhythm, S1, S2 normal, no murmur, click, rub or gallop Lungs: clear to auscultation bilaterally Abdomen: soft, non-tender; bowel sounds normal; no masses,  no organomegaly Extremities: extremities normal, atraumatic, no cyanosis or edema Wound: Clean and dry without sign of infection  Lab Results: Recent Labs    10/12/23 0325 10/12/23 1944 10/13/23 0258  WBC 11.7*  --  11.5*  HGB 7.6* 9.6* 8.8*  HCT 24.6* 29.8* 27.5*  PLT 418*  --  412*   BMET:  Recent Labs    10/12/23 0325 10/13/23 0258  NA 138 139  K 3.8 3.8  CL 105 105  CO2 26 27  GLUCOSE 200* 163*  BUN 60* 52*  CREATININE 2.77* 2.51*  CALCIUM  8.3* 8.2*    PT/INR: No results for input(s): LABPROT, INR in the last 72 hours. ABG    Component Value Date/Time   PHART 7.336 (L) 09/28/2023 0854   HCO3 20.5 09/28/2023 0854   TCO2 22 09/28/2023  0854   ACIDBASEDEF 5.0 (H) 09/28/2023 0854   O2SAT 60.4 10/10/2023 0753   CBG (last 3)  Recent Labs    10/12/23 1520 10/12/23 2319 10/13/23 0616  GLUCAP 265* 216* 163*    Assessment/Plan: S/P Procedure(s) (LRB): CORONARY ARTERY BYPASS GRAFTING (CABG) TIMES FOUR UTILIZING THE LEFT INTERNAL MAMMARY ARTERY AND ENDOSCOPIC VEIN HARVEST RIGHT GREATER SAPHENOUS VEIN (N/A) ECHOCARDIOGRAM, TRANSESOPHAGEAL, INTRAOPERATIVE (N/A)  CV: Hx of NSTEMI, on Plavix . HFrEF, on Coreg  3.125mg  BID. Off milrinone , coox stable. AHF assisting with titration of GDMT as able but restricted with renal failure. NSR, HR 80s-90s, one 7 beat run of NSVT asymptomatic. SBP 120s-130s, AHF started Bidil 1 tab TID. May be able to titrate Coreg  as an outpatient but do not want to get BP too low due to AKI on CKD.    Pulm: Saturating well on RA this AM. Last CXR with bibasilar atelectasis and small left pleural effusion. Encourage IS, flutter valve and ambulation. Continue zoponex neb as needed.   GI: +BM, reports not diarrhea. Tolerating a diet. No nausea/vomiting.    Endo: Uncontrolled T2DM, preop A1C 11.3. CBGs elevated on Semglee  12U daily, Novolog  5U TID with meals and SSI. Will restart home meds at discharge. Will need close medicine follow up.    Renal: AKI on CKD stage 3 baseline Cr 1.3-1.6 with postoperative renal failure, off CRRT. Nephrology deferred iHD, removed dialysis catheter and have signed off with outpatient appointment. Cr  2.51 today, improving. UO 1600cc/24hrs recorded. Will discharge on Torsemide 20mg  daily per AHF.    ID: Leukocytosis, WBC 11.5, trending down since removal of nontunneled dialysis catheter. Afebrile. Clinically monitor.    Expected postop ABLA: H/H 8.8/27.5 today after 1U PRBCs yesterday. No clinical signs of bleeding, hemoccult negative. Iron studies show some iron deficiency anemia, have added iron supplement.    DVT Prophylaxis: On heparin  injection 5000U Q8H.    Deconditioning:  PT/OT now recommending HH, CIR has signed off. Continue work with PT/OT.    Dispo: Doing very well today. No sign of bleeding, recommend iron supplement at discharge. Plan to discharge home today.   LOS: 22 days    Angela Barban, [place] 10/13/2023

## 2023-10-13 NOTE — Care Management Important Message (Signed)
 Important Message  Patient Details  Name: [name] MRN: [id number] Date of Birth: [birth year]   Important Message Given:  Yes - Medicare IM     Janith Melnick 10/13/2023, 9:30 AM

## 2023-10-13 NOTE — Progress Notes (Signed)
 DISCHARGE NOTE HOME Melbert Botelho to be discharged Home per MD order. Discussed prescriptions and follow up appointments with the patient. Prescriptions given to patient; medication list explained in detail. Patient verbalized understanding.  Skin clean, dry and intact without evidence of skin break down, no evidence of skin tears noted. IV catheter discontinued intact. Site without signs and symptoms of complications. Dressing and pressure applied. Pt denies pain at the site currently. No complaints noted.  See LDA for incisions and wounds at discharge chest and right leg. Patient free of other lines, drains, and wounds.   An After Visit Summary (AVS) was printed and given to the patient. Patient escorted via wheelchair, and discharged home via private auto.  Tonda Francisco, RN

## 2023-10-13 NOTE — TOC Transition Note (Addendum)
 Transition of Care [facility]) - Discharge Note   Patient Details  Name: [name] MRN: [id number] Date of Birth: 1950/10/31  [facility] of [facility] CM/SW Contact:  Benjiman Bras, RN Phone Number: [phone] 10/13/2023, 9:14 AM   Clinical Narrative:    TOC CM attempted call to PCP office to update appt. Sent message to provider for hospital follow up.  PCP hospital follow up appt scheduled for 11/02/2023 at 1:30 pm, please call if you are unable to keep appt.  Contacted [facility] to make aware of dc home today.  Pt has Rollator in the room.  Wife will provide transportation home.  Pt has scale at home for daily weights.  Provided pt with Living Better with HF. Education provide to eat heart healthy/low sodium diet.    Final next level of care: Home w Home Health Services Barriers to Discharge: No Barriers Identified   Patient Goals and CMS Choice Patient states their goals for this hospitalization and ongoing recovery are:: wants to get better CMS Medicare.gov Compare Post Acute Care list provided to:: Patient Represenative (must comment) Choice offered to / list presented to : Spouse      Discharge Placement                       Discharge Plan and Services Additional resources added to the After Visit Summary for     Discharge Planning Services: CM Consult Post Acute Care Choice: Home Health          DME Arranged: Walker rolling with seat DME Agency: [employer]       HH Arranged: PT HH Agency: [facility] Date [facility] Agency Contacted: 10/10/23 Time HH Agency Contacted: [phone] Representative spoke with at [facility] Agency: [facility]  Social Drivers of Health (SDOH) Interventions SDOH Screenings   Food Insecurity: No Food Insecurity (09/22/2023)  Housing: Low Risk  (09/22/2023)  Transportation Needs: No Transportation Needs (09/22/2023)  Utilities: Not At Risk (09/22/2023)  Alcohol Screen: Low Risk  (08/21/2023)  Depression (PHQ2-9): Low Risk   (08/21/2023)  Financial Resource Strain: Low Risk  (08/21/2023)  Physical Activity: Inactive (08/21/2023)  Social Connections: Socially Integrated (09/22/2023)  Stress: No Stress Concern Present (08/21/2023)  Tobacco Use: Medium Risk (10/06/2023)  Health Literacy: Adequate Health Literacy (08/21/2023)     Readmission Risk Interventions     No data to display

## 2023-10-13 NOTE — Progress Notes (Signed)
 CARDIAC REHAB PHASE I    Post OHS education including site care, restrictions, heart healthy diabetic diet, sternal precautions, IS use at home, home needs at discharge, exercise guidelines and CRP2 reviewed. All questions and concerns addressed. Will refer to [facility] for CRP2. Plan for discharge home later today.   [id number] Ronny Colas, RN BSN 10/13/2023 10:30 AM

## 2023-10-15 ENCOUNTER — Encounter: Payer: Self-pay | Admitting: Student

## 2023-10-15 DIAGNOSIS — I255 Ischemic cardiomyopathy: Secondary | ICD-10-CM | POA: Diagnosis not present

## 2023-10-15 DIAGNOSIS — I130 Hypertensive heart and chronic kidney disease with heart failure and stage 1 through stage 4 chronic kidney disease, or unspecified chronic kidney disease: Secondary | ICD-10-CM | POA: Diagnosis not present

## 2023-10-15 DIAGNOSIS — N179 Acute kidney failure, unspecified: Secondary | ICD-10-CM | POA: Diagnosis not present

## 2023-10-15 DIAGNOSIS — N183 Chronic kidney disease, stage 3 unspecified: Secondary | ICD-10-CM | POA: Diagnosis not present

## 2023-10-15 DIAGNOSIS — I2510 Atherosclerotic heart disease of native coronary artery without angina pectoris: Secondary | ICD-10-CM | POA: Diagnosis not present

## 2023-10-15 DIAGNOSIS — Z951 Presence of aortocoronary bypass graft: Secondary | ICD-10-CM

## 2023-10-15 DIAGNOSIS — I214 Non-ST elevation (NSTEMI) myocardial infarction: Secondary | ICD-10-CM | POA: Diagnosis not present

## 2023-10-15 DIAGNOSIS — I5023 Acute on chronic systolic (congestive) heart failure: Secondary | ICD-10-CM | POA: Diagnosis not present

## 2023-10-15 DIAGNOSIS — E1122 Type 2 diabetes mellitus with diabetic chronic kidney disease: Secondary | ICD-10-CM | POA: Diagnosis not present

## 2023-10-15 DIAGNOSIS — Z48812 Encounter for surgical aftercare following surgery on the circulatory system: Secondary | ICD-10-CM | POA: Diagnosis not present

## 2023-10-16 ENCOUNTER — Telehealth: Payer: Self-pay

## 2023-10-16 ENCOUNTER — Other Ambulatory Visit: Payer: Self-pay

## 2023-10-16 DIAGNOSIS — E119 Type 2 diabetes mellitus without complications: Secondary | ICD-10-CM

## 2023-10-16 MED ORDER — ACCU-CHEK SOFTCLIX LANCETS MISC: 12 refills | Status: AC

## 2023-10-16 MED ORDER — ACCU-CHEK GUIDE ME W/DEVICE KIT: PACK | 0 refills | Status: AC

## 2023-10-16 MED ORDER — FE FUM-VIT C-VIT B12-FA 460-60-0.01-1 MG PO CAPS: 1.0000 | ORAL_CAPSULE | Freq: Every day | ORAL | 3 refills | Status: DC

## 2023-10-16 MED ORDER — ACCU-CHEK GUIDE TEST VI STRP: ORAL_STRIP | 12 refills | Status: AC

## 2023-10-16 NOTE — Transitions of Care (Post Inpatient/ED Visit) (Signed)
   10/16/2023  Name: Tlaloc Taddei MRN: [id number] DOB: [birth year]  Today's TOC FU Call Status: Today's TOC FU Call Status:: Unsuccessful Call (1st Attempt) Unsuccessful Call (1st Attempt) Date: 10/16/23  Attempted to reach the patient regarding the most recent Inpatient/ED visit. Left generic confidential voice mail with call back number on designated contact number in EPIC.   Follow Up Plan: No further outreach attempts will be made at this time. We have been unable to contact the patient.   Bing Edison MSN, RN RN Case Sales executive Health  [facility] Office Hours M-F [phone] Direct Dial: [phone] Main Phone [phone]  Fax: [phone] [email or web address]

## 2023-10-17 ENCOUNTER — Telehealth: Payer: Self-pay | Admitting: Pharmacist

## 2023-10-17 ENCOUNTER — Telehealth: Payer: Self-pay

## 2023-10-17 ENCOUNTER — Other Ambulatory Visit (HOSPITAL_COMMUNITY): Payer: Self-pay

## 2023-10-17 DIAGNOSIS — R739 Hyperglycemia, unspecified: Secondary | ICD-10-CM | POA: Diagnosis not present

## 2023-10-17 DIAGNOSIS — R404 Transient alteration of awareness: Secondary | ICD-10-CM | POA: Diagnosis not present

## 2023-10-17 DIAGNOSIS — E161 Other hypoglycemia: Secondary | ICD-10-CM | POA: Diagnosis not present

## 2023-10-17 DIAGNOSIS — E162 Hypoglycemia, unspecified: Secondary | ICD-10-CM | POA: Diagnosis not present

## 2023-10-17 DIAGNOSIS — E119 Type 2 diabetes mellitus without complications: Secondary | ICD-10-CM

## 2023-10-17 DIAGNOSIS — R410 Disorientation, unspecified: Secondary | ICD-10-CM | POA: Diagnosis not present

## 2023-10-17 MED ORDER — FIASP FLEXTOUCH 100 UNIT/ML ~~LOC~~ SOPN: 25.0000 [IU] | PEN_INJECTOR | Freq: Every day | SUBCUTANEOUS | Status: DC

## 2023-10-17 NOTE — Telephone Encounter (Signed)
-----   Message from [facility] Katie L sent at 10/17/2023  3:46 PM EDT ----- Regarding: f/u Hello!   I am seeing this pt in the home from the HF clinic. He was recently d/c from hosp.  Last night he had episode of low CBG in the 30s and EMS had to come out here to get it up. Wife has a lot of questions about sliding scale for the insulin  and a lot of scenario questions and what to do in those situations. He does have f/u on 7/10 with Dr. Cleotilde but can he be seen sooner than that?   Thanks!  Izetta Quivers, EMT-Paramedic  [phone] 10/17/2023

## 2023-10-17 NOTE — Transitions of Care (Post Inpatient/ED Visit) (Signed)
 10/17/2023  Name: [name] MRN: [id number] DOB: 08/03/1950  Today's TOC FU Call Status:   Patient's Name and Date of Birth confirmed.  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 How have you been since you were released from the hospital?: Better Any questions or concerns?: Yes Patient Questions/Concerns:: Some qeustions around blood sugars and insulin  pens, discussed and will discuss with paramedicine visit today. Patient Questions/Concerns Addressed: Other:  Items Reviewed: Did you receive and understand the discharge instructions provided?: Yes Medications obtained,verified, and reconciled?: Yes (Medications Reviewed) Any new allergies since your discharge?: No Dietary orders reviewed?: Yes Type of Diet Ordered:: Heart Healtlty, Carb modified, watch Na, not specifically listed in DC Summary or AVS Do you have support at home?: Yes Name of Support/Comfort Primary Source: Billig,[name] (Spouse)  [phone] (Mobile)  Medications Reviewed Today: Medications Reviewed Today     Reviewed by Carolee Heron NOVAK, RN (Case Manager) on 10/17/23 at 1502  Med List Status: <None>   Medication Order Taking? Sig Documenting Provider Last Dose Status Informant  Accu-Chek Softclix Lancets lancets [id number] Yes USE AS DIRECTED UP TO FOUR TIMES DAILY Cleotilde Perkins, DO  Active   aspirin  EC 81 MG tablet [id number] Yes Take 81 mg by mouth daily. [provider]  Active Self           Med Note ROZENA, SEBASTIAN   Thu Sep 21, 2023 11:10 AM)    atorvastatin  (LIPITOR ) 80 MG tablet [id number] Yes Take 1 tablet (80 mg total) by mouth daily. Raguel Con RAMAN, PA-C  Active   BD PEN NEEDLE NANO 2ND GEN 32G X 4 MM MISC [id number] Yes USE 3 TIMES DAILY AS NEEDED BETWEEN MEALS AND BEDTIME Cleotilde Perkins, DO  Active   blood glucose  meter kit and supplies [id number] Yes Dispense based on patient and insurance preference. Use up to four times daily as directed. (FOR ICD-10 E10.9, E11.9). Lilland, Alana, DO  Active Self  Blood Glucose Monitoring Suppl (ACCU-CHEK GUIDE ME) w/Device KIT [id number] Yes Please use to check blood sugar level up to four times daily. Cleotilde Perkins, DO  Active   carvedilol  (COREG ) 3.125 MG tablet [id number] Yes Take 1 tablet (3.125 mg total) by mouth 2 (two) times daily with a meal. Raguel Con RAMAN, PA-C  Active   clopidogrel  (PLAVIX ) 75 MG tablet [id number] Yes Take 1 tablet (75 mg total) by mouth daily. Raguel Con RAMAN, PA-C  Active   Continuous Glucose Sensor (FREESTYLE LIBRE 3 SENSOR) [place] [id number] Yes 2 Devices by Does not apply route as needed. Place 1 sensor on the skin every 14 days. Use to check glucose continuously McDiarmid, Krystal BIRCH, MD  Active   Fe Fum-Vit C-Vit B12-FA (TRIGELS-F FORTE) CAPS capsule [id number]  Take 1 capsule by mouth daily after breakfast. Cleotilde Perkins, DO  Active            Med Note SYDELL, HERON NOVAK   Tue Oct 17, 2023  3:02 PM) Pending being mailed from [place] pharmacy per PCP office.   glucose blood (ACCU-CHEK GUIDE TEST) test strip [id number] Yes Please use to check blood sugar up to 4 times daily. Cleotilde Perkins, DO  Active   hydrALAZINE  (APRESOLINE ) 50 MG tablet [id number] Yes Take 1 tablet (50 mg total) by mouth 3 (three) times daily. Raguel Con RAMAN, PA-C  Active   insulin  aspart (FIASP  FLEXTOUCH) 100 UNIT/ML FlexTouch Pen  [id number] Yes Inject 40 Units into the skin daily before supper. McDiarmid, Krystal BIRCH, MD  Active   Insulin  Glargine (BASAGLAR  KWIKPEN) 100 UNIT/ML [id number] Yes DIAL AND INJECT 65 UNITS UNDER THE SKIN DAILY. Cleotilde Perkins, DO  Active   isosorbide  mononitrate (IMDUR ) 30 MG 24 hr tablet [id number] Yes Take 1 tablet (30 mg total) by mouth daily.   Active   metFORMIN  (GLUCOPHAGE ) 500 MG tablet [id number] Yes Take 1 tablet (500 mg total) by mouth 2 (two)  times daily with a meal. McDiarmid, Krystal BIRCH, MD  Active            Med Note MAUDIE MAUDE KANDICE Sonjia Mar 14, 2023  2:29 PM)    potassium chloride  SA (KLOR-CON  M) 20 MEQ tablet [id number] Yes Take 2 tablets (40 mEq total) by mouth daily. Raguel Con RAMAN, [place]  Active   torsemide  (DEMADEX ) 20 MG tablet [id number] Yes Take 3 tablets (60 mg total) by mouth daily. Raguel Con RAMAN, [place]  Active   traMADol  (ULTRAM ) 50 MG tablet [id number] Yes Take 1 tablet (50 mg total) by mouth every 6 (six) hours as needed for moderate pain (pain score 4-6). Raguel Con RAMAN, PA-C  Active             Home Care and Equipment/Supplies: Were Home Health Services Ordered?: Yes Name of Home Health Agency:: [facility] Has Agency set up a time to come to your home?: Yes First Home Health Visit Date: 10/14/23 (For evaluation) Any new equipment or medical supplies ordered?: Yes Name of Medical supply agency?: [facility] Were you able to get the equipment/medical supplies?: Yes Do you have any questions related to the use of the equipment/supplies?: No  Functional Questionnaire: Do you need assistance with bathing/showering or dressing?: No Do you need assistance with meal preparation?: No Do you need assistance with eating?: No Do you have difficulty maintaining continence: No Do you need assistance with getting out of bed/getting out of a chair/moving?: No Do you have difficulty managing or taking your medications?: No  Follow up appointments reviewed: PCP Follow-up appointment confirmed?: Yes Date of PCP follow-up appointment?: 11/02/23 (Spouse to call or have paramedicine call the move HFU visit appointment if possible, declined Care Guide assist due to paramedicine coming today.) Follow-up Provider: Perkins Cleotilde Specialist [facility] Follow-up appointment confirmed?: Yes Date of Specialist follow-up appointment?: 10/31/23 Follow-Up Specialty Provider:: Kerrin Elspeth BROCKS, MD Follow up on 10/31/2023. Do you  need transportation to your follow-up appointment?: No Do you understand care options if your condition(s) worsen?: Yes-patient verbalized understanding (Reviewed AVS and discharge instructions. Will speak to paramedicine today about blood sugars, weight changes since discharge.)  SDOH Interventions Today    Flowsheet Row Most Recent Value  SDOH Interventions   Food Insecurity Interventions Intervention Not Indicated  Housing Interventions Intervention Not Indicated  Transportation Interventions Intervention Not Indicated  Utilities Interventions Intervention Not Indicated  Social Connections Interventions Intervention Not Indicated, Patient Declined  Health Literacy Interventions Intervention Not Indicated, Other (Comment)  [Spouse assists if needed]  10/17/23: Completed successful outreach with patient and spouse on the call.  Patient in enrolled in the [facility] Paramedicine program for heart failure and so declined the need for follow up calls or the 30-day program.  Also declined Care Guide outreach for PCP HFU moved up date from 11/02/23 as will ask paramedicine to do that today on first visit.  Patient with spouse on the call reviewed medications, discharge instructions, follow up appointment, surgical wound, home health, DME,  Blood sugars, weights, and blood pressure monitoring needs.  Will discuss issues with paramedicine later today. Spouse had to call 911 last night and patient's blood sugar was 30, the gave fluids, blood sugar leveled out at 180. Has Freestyle Libre and checks finger sticks occasionally. Discussed timing/dose of insulin  in evening and to discuss with paramedicine as well. No chest pain or shortness of breath, no other issues on brief review of systems. No drainage or redness on incision, discussed s/s of infection, reviewed what to call provider for. Will discuss minor weight gain with paramedicine today, but no reported edema or shortness of breath. Home Health RN came  out for initial evaluation Saturday. Spouse assists in transportation, medication management, tracking weights, blood sugars, and encouraged to keep track/log of blood pressures as well.    Bing Edison MSN, RN RN Case Sales executive Health  [facility] Office Hours M-F [phone] Direct Dial: [phone] Main Phone [phone]  Fax: [phone] [email or web address]

## 2023-10-17 NOTE — Telephone Encounter (Signed)
 Patient contacted for follow-up of post-hospitalization glucose control and concern about low readings.   Patient and spouse were both on the call.  Spouse, [name], is now very involved in assisting with insulin  dosing.   Current discharge insulin  dosing  Basaglar  (insulin  glargine) 65 units in the AM Fiasp  (insulin  aspart) 40 units with only 1 large meal per day  Due to nocturnal low and current reading of 86 on CGM we agreed to lower insulin  dosing with supper today to 25 units.  He is connected to [facility] via [facility] at this time and has functioning sensor in place.   Medication Plan: - Reduce mealtime dose of Fiasp  (insulin  aspart) from 40 to 25 units   We also discussed when to hold administration time for both AM and pre-dinner administration.  Following discussion, patient's spouse verbalized understanding of treatment plan and thanked me for follow-up.    Total time with patient call and documentation of interaction: 23 minutes.

## 2023-10-17 NOTE — Progress Notes (Signed)
 Paramedicine Encounter    Patient ID: [name], male    DOB: May 30, 1950, 73 y.o.   MRN: [id number]   Complaints-feels good   Edema-none   Compliance with meds-yes  Pill box filled-yes If so, by whom-wife filled it   Refills needed-none --I did tell her that when the TOC meds are down to a week left then to call [facility] and get them to pull those meds over to be refilled.     SOCIAL/MEDICAL BARRIERS:  PHARMACY USED -[facility]    MED ISSUES:  AFFORDABILITY-none   PT ASSIST APPS NEEDED-n/a  PCP-dr miller    INSURANCE-yes  SOURCE OF INCOME-retirement/ssa   TRANSPORTATION-drives self/wife   FOOD INSECURITIES/NEEDS-none  FOOD STAMPS-none   REVIEWED DIET/FLUID/SALT RESTRICTIONS-yes-does not use salt-uses no salt and does not go out to eat   RENT/OWN HOME ISSUES-rents but is manageable  SOCIAL SUPPORT-lives with wife, grand-daughter visits, kids that live close by and are helpful   SAFETY/DOMESTIC ISSUES-none   SUBSTANCE ABUSE-none   DAILY WEIGHTS-tries to most of the time   EDUCATE ON DISEASE PROCESS/SYMPTOMS/PURPOSES OF MEDS-yes    Here for first visit with pt post hosp d/c.  He lives with wife and has good family support.  Last night, his CBG dropped and they had to call 911--EMS reports his CBG was in the 30s, he was given IV meds to help raise it and he stayed at home.  He reports he had smaller portion of supper last night and before he went to bed his CBG was in the 50s and had some OJ and small snack prior to bed.   He reports since he has been home, he feels bored.  He had a lengthy stay in the hosp and had NSTEMI.  He reports he feels good. He states his breathing is doing good.  He reports he had uncomfortable feeling in his chest that went across his chest and some today.  He reports he can walk around his house and feels ok.   Wife fills pill box. I checked it and it was accurate.   She has a lot of concerns of his blood sugar readings and what  if questions so I messaged his doc at internal med to see if he can get seen sooner than 7/10.  Dr amalia actually called during our visit and clarified the dosing and instructions.   Decrease to fiasp  to 25U at bedtime.  CBG- freestyle- 110 currently   EKG done due to him having discomfort earlier today. No changes since last EKG done.   Wife had questions about how much exercise/moving he should be doing , so we talked about that and not to over exert but to move around house during commercials, sit with legs elevated etc.    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   Acute on chronic combined systolic and diastolic heart failure (HCC) 09/25/2023   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   Ischemic cardiomyopathy  09/23/2023   Type 2 diabetes mellitus with hyperglycemia, with long-term current use of insulin  (HCC) 09/23/2023   Type 2 diabetes mellitus with hyperlipidemia (HCC) 09/23/2023   AKI (acute kidney injury) (HCC) 09/23/2023   Benign hypertension with CKD (chronic kidney disease) stage III (HCC) 09/23/2023   Chronic health problem 09/22/2023   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) 09/22/2023   NSTEMI (non-ST elevated myocardial infarction) (HCC) 09/21/2023   Type 2 diabetes mellitus with diabetic chronic kidney disease (HCC) 09/21/2023   CKD (chronic kidney disease) 09/21/2023   Pulmonary nodule 09/21/2023   Heart failure with mildly reduced ejection  fraction (HCC) 01/24/2022   Non-cardiac chest pain    Coronary artery disease involving native coronary artery of native heart without angina pectoris    Hypercholesterolemia    Atypical angina (HCC) 01/22/2022   Low-level of literacy 05/21/2018   Globus sensation 03/12/2018   Type 2 diabetes mellitus without complication, with long-term current use of insulin  (HCC) 06/09/2017   Pure hypercholesterolemia 06/09/2017   History of coronary artery stent placement 06/09/2017   Essential hypertension, benign 06/09/2017    Current Outpatient Medications:    Accu-Chek Softclix Lancets lancets, USE AS DIRECTED UP TO FOUR TIMES DAILY, Disp: 100 each, Rfl: 12   aspirin  EC 81 MG tablet, Take 81 mg by mouth daily., Disp: , Rfl:    atorvastatin  (LIPITOR ) 80 MG tablet, Take 1 tablet (80 mg total) by mouth daily., Disp: 60 tablet, Rfl: 1   BD PEN NEEDLE NANO 2ND GEN 32G X 4 MM MISC, USE 3 TIMES DAILY AS NEEDED BETWEEN MEALS AND BEDTIME, Disp: 100 each, Rfl: 2   blood glucose meter kit and supplies, Dispense based on patient and insurance preference. Use up to four times daily as directed. (FOR ICD-10 E10.9, E11.9)., Disp: 1 each, Rfl: 0   Blood Glucose Monitoring Suppl (ACCU-CHEK GUIDE ME) w/Device KIT, Please use to check blood sugar level up to four times daily., Disp: 1 kit, Rfl: 0   carvedilol  (COREG ) 3.125 MG tablet, Take 1 tablet (3.125 mg total) by mouth 2 (two) times daily with a meal., Disp: 60 tablet, Rfl: 1   clopidogrel  (PLAVIX ) 75 MG tablet, Take 1 tablet (75 mg total) by mouth daily., Disp: 60 tablet, Rfl: 1   Continuous Glucose Sensor (FREESTYLE LIBRE 3 SENSOR) MISC, 2 Devices by Does not apply route as needed. Place 1 sensor on the skin every 14 days. Use to check glucose continuously, Disp: 2 each, Rfl: 11   glucose blood (ACCU-CHEK GUIDE TEST) test strip, Please use to check blood sugar up to 4 times daily., Disp: 100 each, Rfl: 12   hydrALAZINE  (APRESOLINE ) 50 MG tablet, Take 1 tablet (50  mg total) by mouth 3 (three) times daily., Disp: 90 tablet, Rfl: 1   Insulin  Glargine (BASAGLAR  KWIKPEN) 100 UNIT/ML, DIAL AND INJECT 65 UNITS UNDER THE SKIN DAILY., Disp: 15 mL, Rfl: 0   isosorbide  mononitrate (IMDUR ) 30 MG 24 hr tablet, Take 1 tablet (30 mg total) by mouth daily., Disp: 30 tablet, Rfl: 1   metFORMIN  (GLUCOPHAGE ) 500 MG tablet, Take 1 tablet (500 mg total) by mouth 2 (two) times daily with a meal., Disp: , Rfl:    potassium chloride  SA (KLOR-CON  M) 20 MEQ tablet, Take 2 tablets (40 mEq total) by mouth daily., Disp: 60 tablet, Rfl: 1   torsemide  (DEMADEX ) 20 MG tablet, Take 3 tablets (60 mg total) by mouth daily., Disp: 90 tablet, Rfl: 1   traMADol  (ULTRAM )  50 MG tablet, Take 1 tablet (50 mg total) by mouth every 6 (six) hours as needed for moderate pain (pain score 4-6)., Disp: 28 tablet, Rfl: 0   Fe Fum-Vit C-Vit B12-FA (TRIGELS-F FORTE) CAPS capsule, Take 1 capsule by mouth daily after breakfast. (Patient not taking: Reported on 10/17/2023), Disp: 90 capsule, Rfl: 3   insulin  aspart (FIASP  FLEXTOUCH) 100 UNIT/ML FlexTouch Pen, Inject 25 Units into the skin daily before supper., Disp: , Rfl:  Allergies  Allergen Reactions   Trulicity [Dulaglutide] Other (See Comments)    Patient indicated this caused pancreatitis.       Social History   Socioeconomic History   Marital status: Married    Spouse name: [name]   Number of children: 4   Years of education: 10   Highest education level: 10th grade  Occupational History   Occupation: retired  Tobacco Use   Smoking status: Former    Current packs/day: 0.00    Average packs/day: 0.5 packs/day for 10.0 years (5.0 ttl pk-yrs)    Types: Cigarettes    Start date: 04/26/1983    Quit date: 04/25/1993    Years since quitting: 30.4   Smokeless tobacco: Never  Vaping Use   Vaping status: Never Used  Substance and Sexual Activity   Alcohol use: No   Drug use: No   Sexual activity: Yes  Other Topics Concern   Not on file  Social  History Narrative   Patient lives with his wife in [place].   Patent has 4 boys, one lives in [place] .    Patient enjoys fishing, camping in the woods, working outside, anything outdoor related.   Social Drivers of Corporate investment banker Strain: Low Risk  (08/21/2023)   Overall Financial Resource Strain (CARDIA)    Difficulty of Paying Living Expenses: Not very hard  Food Insecurity: No Food Insecurity (10/17/2023)   Hunger Vital Sign    Worried About Running Out of Food in the Last Year: Never true    Ran Out of Food in the Last Year: Never true  Transportation Needs: No Transportation Needs (10/17/2023)   PRAPARE - Administrator, Civil Service (Medical): No    Lack of Transportation (Non-Medical): No  Physical Activity: Inactive (08/21/2023)   Exercise Vital Sign    Days of Exercise per Week: 0 days    Minutes of Exercise per Session: 0 min  Stress: No Stress Concern Present (08/21/2023)   [employer] of Occupational Health - Occupational Stress Questionnaire    Feeling of Stress : Not at all  Social Connections: Moderately Integrated (10/17/2023)   Social Connection and Isolation Panel    Frequency of Communication with Friends and Family: More than three times a week    Frequency of Social Gatherings with Friends and Family: More than three times a week    Attends Religious Services: More than 4 times per year    Active Member of [employer] or Organizations: No    Attends Banker Meetings: Never    Marital Status: Married  Catering manager Violence: Not At Risk (10/17/2023)   Humiliation, Afraid, Rape, and Kick questionnaire    Fear of Current or Ex-Partner: No    Emotionally Abused: No    Physically Abused: No    Sexually Abused: No    Physical Exam      Future Appointments  Date Time Provider Department Center  10/24/2023  1:30 PM [facility] PA/NP [facility] None  11/01/2023  9:00 AM [facility]  [place] PA [facility] H&V  11/02/2023  1:30 PM  Cleotilde Perkins, DO [facility]  08/26/2024  2:20 PM [facility] ANNUAL WELLNESS VISIT [facility]       Izetta Quivers, Paramedic [phone] [facility] Paramedic  10/17/23

## 2023-10-17 NOTE — Telephone Encounter (Signed)
 [name] from [facility] calling for PT verbal orders as follows:  2 time(s) weekly for 3 week(s), then 1 time(s) weekly for 5 week(s)  Verbal orders given per [facility] protocol  [name], RN

## 2023-10-18 ENCOUNTER — Telehealth: Payer: Self-pay | Admitting: Pharmacist

## 2023-10-18 DIAGNOSIS — E119 Type 2 diabetes mellitus without complications: Secondary | ICD-10-CM

## 2023-10-18 MED ORDER — BASAGLAR KWIKPEN 100 UNIT/ML ~~LOC~~ SOPN: 50.0000 [IU] | PEN_INJECTOR | Freq: Every day | SUBCUTANEOUS | Status: DC

## 2023-10-18 MED ORDER — FIASP FLEXTOUCH 100 UNIT/ML ~~LOC~~ SOPN: 25.0000 [IU] | PEN_INJECTOR | Freq: Every day | SUBCUTANEOUS | Status: DC

## 2023-10-18 NOTE — Telephone Encounter (Signed)
 Patient contacted for follow-up of glucose control.  Review of overnight CGM shows low reading < 60  Since last contact patient's spouse reports she did NOT administer the prandial insulin  last night due to patient being lower than anticipated.  She reports that Zackary is not eating as much as normal.   Current Medications include: Basaglar  65 units daily and Fiasp  (insulin  aspart) 25 units  Patient denies any significant medication related side effects.  Medication Plan: - Reduced long-acting insulin   from 65 to 50 units - starting tomorrow (patient already had dose today)  Discussed eating patterns / activity and stress.   Total time with patient call and documentation of interaction: 11 minutes.  F/U Phone call planned: 1-2 days dependent on CGM responses.

## 2023-10-18 NOTE — Telephone Encounter (Signed)
 Reviewed and agree with Dr Rennis plan.  We greatly appreciate Ms Arneta attention to patient's diabetes care and Dr Rennis close management of patient's insulin  regiment.

## 2023-10-18 NOTE — Progress Notes (Signed)
 [address]               [name] [place], [place] [address]                      [phone]   HPI: This is a 73 year old male who is s/p median sternotomy, extracorporeal circulation, coronary artery bypass grafting x4 (left internal mammary artery to LAD, saphenous vein graft to posterior descending, sequential saphenous vein graft to obtuse marginals 2 and 3) with endoscopic vein harvest right leg by Dr. Kerrin on 09/27/2023. Patient had a history of CKD and required CRRT post op and creatinine was 2.51 at discharge. He was discharged on 10/13/2023. He presents today for routine post op follow up. He has occasional shortness of breath but has not worsened since discharge.  Current Outpatient Medications  Medication Sig Dispense Refill   Accu-Chek Softclix Lancets lancets USE AS DIRECTED UP TO FOUR TIMES DAILY 100 each 12   aspirin  EC 81 MG tablet Take 81 mg by mouth daily.     atorvastatin  (LIPITOR ) 80 MG tablet Take 1 tablet (80 mg total) by mouth daily. 60 tablet 1   BD PEN NEEDLE NANO 2ND GEN 32G X 4 MM MISC USE 3 TIMES DAILY AS NEEDED BETWEEN MEALS AND BEDTIME 100 each 2   blood glucose meter kit and supplies Dispense based on patient and insurance preference. Use up to four times daily as directed. (FOR ICD-10 E10.9, E11.9). 1 each 0   Blood Glucose Monitoring Suppl (ACCU-CHEK GUIDE ME) w/Device KIT Please use to check blood sugar level up to four times daily. 1 kit 0   carvedilol  (COREG ) 3.125 MG tablet Take 1 tablet (3.125 mg total) by mouth 2 (two) times daily with a meal. 60 tablet 1   clopidogrel  (PLAVIX ) 75 MG tablet Take 1 tablet (75 mg total) by mouth daily. 60 tablet 1   Continuous Glucose Sensor (FREESTYLE LIBRE 3 SENSOR) MISC 2 Devices by Does not apply route as needed. Place 1 sensor on the skin every 14 days. Use to check glucose continuously 2 each 11   Fe Fum-Vit C-Vit B12-FA (TRIGELS-F FORTE) CAPS capsule Take 1 capsule by mouth daily after  breakfast. (Patient not taking: Reported on 10/17/2023) 90 capsule 3   glucose blood (ACCU-CHEK GUIDE TEST) test strip Please use to check blood sugar up to 4 times daily. 100 each 12   hydrALAZINE  (APRESOLINE ) 50 MG tablet Take 1 tablet (50 mg total) by mouth 3 (three) times daily. 90 tablet 1   insulin  aspart (FIASP  FLEXTOUCH) 100 UNIT/ML FlexTouch Pen Inject 25 Units into the skin daily before supper.     Insulin  Glargine (BASAGLAR  KWIKPEN) 100 UNIT/ML DIAL AND INJECT 65 UNITS UNDER THE SKIN DAILY. 15 mL 0   isosorbide  mononitrate (IMDUR ) 30 MG 24 hr tablet Take 1 tablet (30 mg total) by mouth daily. 30 tablet 1   metFORMIN  (GLUCOPHAGE ) 500 MG tablet Take 1 tablet (500 mg total) by mouth 2 (two) times daily with a meal.     potassium chloride  SA (KLOR-CON  M) 20 MEQ tablet Take 2 tablets (40 mEq total) by mouth daily. 60 tablet 1   torsemide  (DEMADEX ) 20 MG tablet Take 3 tablets (60 mg total) by mouth daily. 90 tablet 1   traMADol  (ULTRAM ) 50 MG tablet Take 1 tablet (50 mg total) by mouth every 6 (six) hours as needed  for moderate pain (pain score 4-6). 28 tablet 0  Vital Signs: Vitals:   11/01/23 0911  BP: (!) 150/82  Pulse: 100  Resp: 20  SpO2: 97%    Physical Exam: CV-RRR Pulmonary-Clear to auscultation bilaterally Abdomen-Soft, non tender, bowel sounds Extremities-No LE edema Wounds- Clean, dry, healing without signs of infection  Diagnostic Tests: Narrative & Impression  CLINICAL DATA:  Status post CABG.   EXAM: CHEST - 2 VIEW   COMPARISON:  10/10/2023.   FINDINGS: The heart size and mediastinal contours are within normal limits. Intact median sternotomy. Status post CABG. Similar mild linear atelectasis/scarring in the left mid lung. Decreased trace left pleural effusion. No focal consolidation. No pneumothorax. No acute osseous abnormality.   IMPRESSION: 1. Decreased trace left pleural effusion. 2. Similar mild linear atelectasis/scarring in the left mid lung.      Electronically Signed   By: Harrietta Sherry M.D.   On: 11/01/2023 08:59     Impression and Plan: We reviewed today's chest x ray. He has not been using his incentive spirometer and was encouraged to do so. Of note, he has not taken any of his blood pressure medications yet this am. Will not make changes at this time. We discussed driving (not to take Ultram  prior to driving), participation in cardiac rehab (he wants to think about this), and continuance of sternal precautions until 11/27/2023. His wife reports his glucose is fairly well controlled but on occasion over 200. He needs follow up with PCP regarding further diabetes management and surveillance of his diabetes (has an appointment tomorrow). He has already seen advanced heart failure on 07/01 and no changes were made to his medications. He will return PRN to TCTS and continued to be followed by advanced heart failure.   Kyla CHRISTELLA Donald, PA-C [facility] [phone]

## 2023-10-19 DIAGNOSIS — I130 Hypertensive heart and chronic kidney disease with heart failure and stage 1 through stage 4 chronic kidney disease, or unspecified chronic kidney disease: Secondary | ICD-10-CM | POA: Diagnosis not present

## 2023-10-19 DIAGNOSIS — I2510 Atherosclerotic heart disease of native coronary artery without angina pectoris: Secondary | ICD-10-CM | POA: Diagnosis not present

## 2023-10-19 DIAGNOSIS — N179 Acute kidney failure, unspecified: Secondary | ICD-10-CM | POA: Diagnosis not present

## 2023-10-19 DIAGNOSIS — Z48812 Encounter for surgical aftercare following surgery on the circulatory system: Secondary | ICD-10-CM | POA: Diagnosis not present

## 2023-10-19 DIAGNOSIS — N183 Chronic kidney disease, stage 3 unspecified: Secondary | ICD-10-CM | POA: Diagnosis not present

## 2023-10-19 DIAGNOSIS — I214 Non-ST elevation (NSTEMI) myocardial infarction: Secondary | ICD-10-CM | POA: Diagnosis not present

## 2023-10-19 DIAGNOSIS — I5023 Acute on chronic systolic (congestive) heart failure: Secondary | ICD-10-CM | POA: Diagnosis not present

## 2023-10-19 DIAGNOSIS — I255 Ischemic cardiomyopathy: Secondary | ICD-10-CM | POA: Diagnosis not present

## 2023-10-19 DIAGNOSIS — E1122 Type 2 diabetes mellitus with diabetic chronic kidney disease: Secondary | ICD-10-CM | POA: Diagnosis not present

## 2023-10-19 NOTE — Telephone Encounter (Signed)
 Reviewed and agree with Dr Macky Lower plan.

## 2023-10-19 NOTE — Telephone Encounter (Signed)
 Patient's spouse contacted for follow-up of glucose control.  CGM and patient's spouse both show/report low glucose readings which were prolonged overnight.  This AM was the first day with reduced dose of long-acting insulin .   Following discussion we agreed to HOLD prandial insulin  with evening meal at this time in attempt to avoid low glucose overnight.  We discussed several days of only basal insulin  to try and find dose which does not lead to overnight low symptoms/readings.   Current Medications include: basaglar  (insulin  glargine) 50 units each AM Hypogycemic management appears appropriate.  11 Total time with patient call and documentation of interaction: 11 minutes.  F/U Phone call planned: 6/30 or earlier if symptomatic lows are identified by patient or CGM.

## 2023-10-19 NOTE — Telephone Encounter (Signed)
 [name] LVM on nurse line requesting to speak with Dr. Amalia.   I called [name] back to gather more information.  She reports this morning his fasting CBG was 80 or 90. She reports she did not give him his dose of Basaglar  because of this.   She reports he ate breakfast and his sugar went up to 170. She reports right now its reading at 156. She reports he feels ok, no hypo/hyper glycemic symptoms.   She reports she feels the Fiasp  is causing his morning sugars to dip. She reports she would like to discuss not giving him his dose this evening with Dr. Koval.   Advised will forward to Koval.

## 2023-10-20 ENCOUNTER — Telehealth: Payer: Self-pay | Admitting: Pharmacist

## 2023-10-20 DIAGNOSIS — E1165 Type 2 diabetes mellitus with hyperglycemia: Secondary | ICD-10-CM

## 2023-10-20 DIAGNOSIS — E119 Type 2 diabetes mellitus without complications: Secondary | ICD-10-CM

## 2023-10-20 MED ORDER — BASAGLAR KWIKPEN 100 UNIT/ML ~~LOC~~ SOPN
30.0000 [IU] | PEN_INJECTOR | Freq: Every day | SUBCUTANEOUS | Status: DC
Start: 2023-10-20 — End: 2023-10-23

## 2023-10-20 NOTE — Assessment & Plan Note (Signed)
 Patient contacted for follow-up of CGM low readings (< 60) overnight.  Early AM call - left message with spouse of patient to not administer long-acting insulin  until we talked.  Requested call back.   At 2:00 PM call I was able to reach patient's spouse and after some conversation we agreed to lower dose of long-acting insulin  to 30 units ONCE daily (no mealtime) today and over the weekend in an attempt to avoid any hypoglycemia.   Medication Plan: - Reduced dose of Basaglar  (insulin  glargine) from 50 to 30 units once daily.

## 2023-10-20 NOTE — Telephone Encounter (Signed)
 Patient contacted for follow-up of CGM low readings (< 60) overnight.  Early AM call - left message with spouse of patient to not administer long-acting insulin  until we talked.  Requested call back.   At 2:00 PM call I was able to reach patient's spouse and after some conversation we agreed to lower dose of long-acting insulin  to 30 units ONCE daily (no mealtime) today and over the weekend in an attempt to avoid any hypoglycemia.   Medication Plan: - Reduced dose of Basaglar  (insulin  glargine) from 50 to 30 units once daily.   Total time with patient call and documentation of interaction: 12 minutes.  F/U Phone call planned: Monday/early next week.

## 2023-10-20 NOTE — Telephone Encounter (Signed)
-----   Message from Maude Lagos sent at 10/17/2023  4:23 PM EDT ----- Regarding: FW: f/u 10/18/2023 ----- Message ----- From: Leodis Pagan, Paramedic Sent: 10/17/2023   3:48 PM EDT To: Maude KANDICE Lagos, RPH-CPP Subject: f/u                                            Hello!   I am seeing this pt in the home from the HF clinic. He was recently d/c from hosp.  Last night he had episode of low CBG in the 30s and EMS had to come out here to get it up. Wife has a lot of questions about sliding scale for the insulin  and a lot of scenario questions and what to do in those situations. He does have f/u on 7/10 with Dr. Cleotilde but can he be seen sooner than that?   Thanks!  Pagan Leodis, EMT-Paramedic  [phone] 10/17/2023

## 2023-10-23 ENCOUNTER — Encounter: Payer: Self-pay | Admitting: Student

## 2023-10-23 ENCOUNTER — Telehealth: Payer: Self-pay | Admitting: Pharmacist

## 2023-10-23 DIAGNOSIS — E119 Type 2 diabetes mellitus without complications: Secondary | ICD-10-CM

## 2023-10-23 MED ORDER — BASAGLAR KWIKPEN 100 UNIT/ML ~~LOC~~ SOPN: 20.0000 [IU] | PEN_INJECTOR | Freq: Every day | SUBCUTANEOUS | 1 refills | Status: DC

## 2023-10-23 MED ORDER — CARVEDILOL 3.125 MG PO TABS: 3.1250 mg | ORAL_TABLET | Freq: Two times a day (BID) | ORAL | 1 refills | Status: DC

## 2023-10-23 NOTE — Telephone Encounter (Signed)
 Reviewed and agree with Dr Macky Lower plan.

## 2023-10-23 NOTE — Telephone Encounter (Signed)
 Patient contacted for follow-up of glucose control - CGM follow-up of low readings.  Talked with both patient and spouse, [name].   Despite lowering dose of Basaglar  (insulin  glargine) to 30 units once daily over the weekend AND continuing to HOLD the mealtime Fiasp  (insulin  aspart).  Since last contact patient reports continuing to heal and feel stronger each day.  Despite use of lower dose of long-acting insulin , overnight low readings persist but are much less severe and shorter.   Medication Plan: - Reduce dose of Basaglar  (insulin  glargine) from 30 units once daily to 20 units once daily.  I anticipate minimal readings <70 with this adjustment.   Total time with patient call and documentation of interaction: 11 minutes.  F/U Phone call planned: end of the week.

## 2023-10-24 ENCOUNTER — Encounter (HOSPITAL_COMMUNITY): Payer: Self-pay

## 2023-10-24 ENCOUNTER — Other Ambulatory Visit (HOSPITAL_COMMUNITY): Payer: Self-pay

## 2023-10-24 ENCOUNTER — Ambulatory Visit (HOSPITAL_COMMUNITY)
Admit: 2023-10-24 | Discharge: 2023-10-24 | Disposition: A | Discharge status: Home / Self Care | Source: Ambulatory Visit | Attending: Cardiology | Admitting: Cardiology

## 2023-10-24 ENCOUNTER — Ambulatory Visit (HOSPITAL_COMMUNITY): Payer: Self-pay | Admitting: Cardiology

## 2023-10-24 VITALS — BP 90/50 | HR 99 | Wt 196.8 lb

## 2023-10-24 DIAGNOSIS — E1122 Type 2 diabetes mellitus with diabetic chronic kidney disease: Secondary | ICD-10-CM | POA: Diagnosis not present

## 2023-10-24 DIAGNOSIS — Z7902 Long term (current) use of antithrombotics/antiplatelets: Secondary | ICD-10-CM | POA: Diagnosis not present

## 2023-10-24 DIAGNOSIS — N1831 Chronic kidney disease, stage 3a: Secondary | ICD-10-CM | POA: Insufficient documentation

## 2023-10-24 DIAGNOSIS — Z955 Presence of coronary angioplasty implant and graft: Secondary | ICD-10-CM | POA: Insufficient documentation

## 2023-10-24 DIAGNOSIS — Z7984 Long term (current) use of oral hypoglycemic drugs: Secondary | ICD-10-CM | POA: Diagnosis not present

## 2023-10-24 DIAGNOSIS — I5022 Chronic systolic (congestive) heart failure: Secondary | ICD-10-CM

## 2023-10-24 DIAGNOSIS — R9431 Abnormal electrocardiogram [ECG] [EKG]: Secondary | ICD-10-CM | POA: Insufficient documentation

## 2023-10-24 DIAGNOSIS — E1165 Type 2 diabetes mellitus with hyperglycemia: Secondary | ICD-10-CM | POA: Insufficient documentation

## 2023-10-24 DIAGNOSIS — I130 Hypertensive heart and chronic kidney disease with heart failure and stage 1 through stage 4 chronic kidney disease, or unspecified chronic kidney disease: Secondary | ICD-10-CM | POA: Diagnosis not present

## 2023-10-24 DIAGNOSIS — E785 Hyperlipidemia, unspecified: Secondary | ICD-10-CM | POA: Insufficient documentation

## 2023-10-24 DIAGNOSIS — I2510 Atherosclerotic heart disease of native coronary artery without angina pectoris: Secondary | ICD-10-CM | POA: Insufficient documentation

## 2023-10-24 DIAGNOSIS — I252 Old myocardial infarction: Secondary | ICD-10-CM | POA: Insufficient documentation

## 2023-10-24 DIAGNOSIS — I255 Ischemic cardiomyopathy: Secondary | ICD-10-CM | POA: Diagnosis not present

## 2023-10-24 DIAGNOSIS — Z794 Long term (current) use of insulin: Secondary | ICD-10-CM | POA: Diagnosis not present

## 2023-10-24 LAB — BASIC METABOLIC PANEL WITH GFR
Anion gap: 17 — ABNORMAL HIGH (ref 5–15)
BUN: 42 mg/dL — ABNORMAL HIGH (ref 8–23)
CO2: 23 mmol/L (ref 22–32)
Calcium: 9.3 mg/dL (ref 8.9–10.3)
Chloride: 97 mmol/L — ABNORMAL LOW (ref 98–111)
Creatinine, Ser: 2.68 mg/dL — ABNORMAL HIGH (ref 0.61–1.24)
GFR, Estimated: 24 mL/min — ABNORMAL LOW (ref >60)
Glucose, Bld: 179 mg/dL — ABNORMAL HIGH (ref 70–99)
Potassium: 5.2 mmol/L — ABNORMAL HIGH (ref 3.5–5.1)
Sodium: 137 mmol/L (ref 135–145)

## 2023-10-24 LAB — BRAIN NATRIURETIC PEPTIDE: B Natriuretic Peptide: 78.8 pg/mL (ref 0.0–100.0)

## 2023-10-24 NOTE — Progress Notes (Signed)
 Paramedicine Encounter   Patient ID: [name] , male,   DOB: 12-11-1950,73 y.o.,  MRN: [id number]   Met patient in clinic today with provider.  Weight @ clinic-196 B/P-90/50 P-99 [id number]  Med changes- Since last visit his insulin  has been changed, wife has been monitoring that and making those changes.   If his weight goes up 3;bs overnight or 5lbs in a week then he can take 20mg  extra torsemide  per brittiany. Along with extra 40meq of potassium.   Wife said [facility] told her that they needed new rx for all those TOC meds, they all have refills on it so [facility] should be able to pull it over. I will give them a call.   Once dr kerrin clears pt then they will see if he can do cardiac rehab.  Labs done today.  Want to keep systolic b/p above 110.    --received message ref his labs and to decrease his potassium to 20meq daily and repeat labs in a week.  I reached out to his wife who manages his meds and she made this change in pill box.   [name], EMT-Paramedic [phone] 10/24/2023

## 2023-10-24 NOTE — Patient Instructions (Signed)
 Medication Changes:  None, continue current medications  Lab Work:  Labs done today, your results will be available in MyChart, we will contact you for abnormal readings.   Testing/Procedures:  Your physician has requested that you have an echocardiogram. Echocardiography is a painless test that uses sound waves to create images of your heart. It provides your doctor with information about the size and shape of your heart and how well your heart's chambers and valves are working. This procedure takes approximately one hour. There are no restrictions for this procedure. Please do NOT wear cologne, perfume, aftershave, or lotions (deodorant is allowed). Please arrive 15 minutes prior to your appointment time. IN 2 MONTHS  Please note: We ask at that you not bring children with you during ultrasound (echo/ vascular) testing. Due to room size and safety concerns, children are not allowed in the ultrasound rooms during exams. Our front office staff cannot provide observation of children in our lobby area while testing is being conducted. An adult accompanying a patient to their appointment will only be allowed in the ultrasound room at the discretion of the ultrasound technician under special circumstances. We apologize for any inconvenience.   Special Instructions // Education:  Do the following things EVERYDAY: Weigh yourself in the morning before breakfast. Write it down and keep it in a log. Take your medicines as prescribed Eat low salt foods--Limit salt (sodium) to 2000 mg per day.  Stay as active as you can everyday Limit all fluids for the day to less than 2 liters   Follow-Up in:   Please follow up with our heart failure pharmacist in 1 month  Your physician recommends that you schedule a follow-up appointment in: 2 months with an echocardiogram and Dr Rolan   At the [facility], you and your health needs are our priority. We have a designated team  specialized in the treatment of Heart Failure. This Care Team includes your primary Heart Failure Specialized Cardiologist (physician), Advanced Practice Providers (APPs- Physician Assistants and Nurse Practitioners), and Pharmacist who all work together to provide you with the care you need, when you need it.   You may see any of the following providers on your designated Care Team at your next follow up:  Dr. Toribio Fuel Dr. Ezra Rolan Dr. Ria Commander Dr. Odis Brownie Greig Mosses, NP Caffie Shed, [place] [facility] [place], [place] Beckey Coe, NP [place] Lee, NP Tinnie Redman, PharmD   Please be sure to bring in all your medications bottles to every appointment.   Need to Contact Us :  If you have any questions or concerns before your next appointment please send us  a message through [employer] or call our office at [phone].    TO LEAVE A MESSAGE FOR THE NURSE SELECT OPTION 2, PLEASE LEAVE A MESSAGE INCLUDING: YOUR NAME DATE OF BIRTH CALL BACK NUMBER REASON FOR CALL**this is important as we prioritize the call backs  YOU WILL RECEIVE A CALL BACK THE SAME DAY AS LONG AS YOU CALL BEFORE 4:00 PM

## 2023-10-24 NOTE — Progress Notes (Signed)
 [facility] Note   Referring Physician: PCP: Cleotilde Perkins, DO PCP-Cardiologist: Vinie JAYSON Maxcy, MD  AHF MD: Dr. Rolan   Chief Complaint: f/u for systolic heart failure  HPI:  74 y.o. 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    He was admitted 09/21/23 with epigastric chest pain radiating to chest and neck. HS troponin elevated to 673. BP markedly elevated. Echo EF 35-40%, RV not well visualized. Cardiac cath with severe 3 v CAD. LVEDP 38-40 mmHg. He was diuresed with IV lasix  and started on GDMT.  He underwent CABG X 4 (LIMA to LAD, SVG to OM1, sequential to OM3, SVG to PDA) by Dr. Kerrin on 09/27/23. There was significant difficulty separating from cardiopulmonary bypass (took 3 attempts). Transferred to the ICU on inotrope/pressors. AHF team consulted to assist w/ post cardiotomy shock. Course was further c/b AKI 2/2 ATN. Scr rose to > 4 (b/l 1.3-1.6). He was started on CRRT for clearance and volume removal. Fortunately, he had renal recovery and able to wean off of CRRT. Also able to wean off pressors/inotropes. Transitioned to PO torsemide  for continued volume management and Imdur /hydralazine  for HF GDMT.  He was discharged home on 6/20. D/c wt 195 lb.   He presents today for post hospital f/u. Here w/ wife and paramedicine, Izetta. Doing well. Wt stable at 196 lb. EKG shows NSR 99 bpm. His BP is soft, 90/50 but he denies orthostatic symptoms. Wife says home BP readings have been in the 130s. He reports good UOP w/ torsemide . Urine is yellow. Stable w/o CP.    Review of Systems: [y] = yes, [ ]  = no   General: Weight gain [ ] ; Weight loss [ ] ; Anorexia [ ] ; Fatigue [ ] ; Fever [ ] ; Chills [ ] ; Weakness [ ]   Cardiac: Chest pain/pressure [ ] ; Resting SOB [ ] ; Exertional SOB [ ] ; Orthopnea [ ] ; Pedal Edema [ ] ; Palpitations [ ] ; Syncope [ ] ; Presyncope [ ] ; Paroxysmal nocturnal  dyspnea[ ]   Pulmonary: Cough [ ] ; Wheezing[ ] ; Hemoptysis[ ] ; Sputum [ ] ; Snoring [ ]   GI: Vomiting[ ] ; Dysphagia[ ] ; Melena[ ] ; Hematochezia [ ] ; Heartburn[ ] ; Abdominal pain [ ] ; Constipation [ ] ; Diarrhea [ ] ; BRBPR [ ]   GU: Hematuria[ ] ; Dysuria [ ] ; Nocturia[ ]   Vascular: Pain in legs with walking [ ] ; Pain in feet with lying flat [ ] ; Non-healing sores [ ] ; Stroke [ ] ; TIA [ ] ; Slurred speech [ ] ;  Neuro: Headaches[ ] ; Vertigo[ ] ; Seizures[ ] ; Paresthesias[ ] ;Blurred vision [ ] ; Diplopia [ ] ; Vision changes [ ]   Ortho/Skin: Arthritis [ ] ; Joint pain [ ] ; Muscle pain [ ] ; Joint swelling [ ] ; Back Pain [ ] ; Rash [ ]   Psych: Depression[ ] ; Anxiety[ ]   Heme: Bleeding problems [ ] ; Clotting disorders [ ] ; Anemia [ ]   Endocrine: Diabetes [ ] ; Thyroid  dysfunction[ ]    Past Medical History:  Diagnosis Date   CAD (coronary artery disease)    Diabetes mellitus without complication (HCC)    Hypercholesterolemia    Hypertension    MI (myocardial infarction) (HCC)    Renal insufficiency     Current Outpatient Medications  Medication Sig Dispense Refill   Accu-Chek Softclix Lancets lancets USE AS DIRECTED UP TO FOUR TIMES DAILY 100 each 12   aspirin  EC 81 MG tablet Take 81 mg by mouth daily.  atorvastatin  (LIPITOR ) 80 MG tablet Take 1 tablet (80 mg total) by mouth daily. 60 tablet 1   BD PEN NEEDLE NANO 2ND GEN 32G X 4 MM MISC USE 3 TIMES DAILY AS NEEDED BETWEEN MEALS AND BEDTIME 100 each 2   carvedilol  (COREG ) 3.125 MG tablet Take 1 tablet (3.125 mg total) by mouth 2 (two) times daily with a meal. 60 tablet 1   clopidogrel  (PLAVIX ) 75 MG tablet Take 1 tablet (75 mg total) by mouth daily. 60 tablet 1   Continuous Glucose Sensor (FREESTYLE LIBRE 3 SENSOR) MISC 2 Devices by Does not apply route as needed. Place 1 sensor on the skin every 14 days. Use to check glucose continuously 2 each 11   glucose blood (ACCU-CHEK GUIDE TEST) test strip Please use to check blood sugar up to 4 times daily.  100 each 12   hydrALAZINE  (APRESOLINE ) 50 MG tablet Take 1 tablet (50 mg total) by mouth 3 (three) times daily. 90 tablet 1   Insulin  Glargine (BASAGLAR  KWIKPEN) 100 UNIT/ML Inject 20 Units into the skin daily. 15 mL 1   isosorbide  mononitrate (IMDUR ) 30 MG 24 hr tablet Take 1 tablet (30 mg total) by mouth daily. 30 tablet 1   metFORMIN  (GLUCOPHAGE ) 500 MG tablet Take 1 tablet (500 mg total) by mouth 2 (two) times daily with a meal.     potassium chloride  SA (KLOR-CON  M) 20 MEQ tablet Take 2 tablets (40 mEq total) by mouth daily. 60 tablet 1   torsemide  (DEMADEX ) 20 MG tablet Take 3 tablets (60 mg total) by mouth daily. 90 tablet 1   traMADol  (ULTRAM ) 50 MG tablet Take 1 tablet (50 mg total) by mouth every 6 (six) hours as needed for moderate pain (pain score 4-6). 28 tablet 0   blood glucose meter kit and supplies Dispense based on patient and insurance preference. Use up to four times daily as directed. (FOR ICD-10 E10.9, E11.9). 1 each 0   Blood Glucose Monitoring Suppl (ACCU-CHEK GUIDE ME) w/Device KIT Please use to check blood sugar level up to four times daily. 1 kit 0   Fe Fum-Vit C-Vit B12-FA (TRIGELS-F FORTE) CAPS capsule Take 1 capsule by mouth daily after breakfast. (Patient not taking: Reported on 10/24/2023) 90 capsule 3   No current facility-administered medications for this encounter.    Allergies  Allergen Reactions   Trulicity [Dulaglutide] Other (See Comments)    Patient indicated this caused pancreatitis.       Social History   Socioeconomic History   Marital status: Married    Spouse name: [name]   Number of children: 4   Years of education: 10   Highest education level: 10th grade  Occupational History   Occupation: retired  Tobacco Use   Smoking status: Former    Current packs/day: 0.00    Average packs/day: 0.5 packs/day for 10.0 years (5.0 ttl pk-yrs)    Types: Cigarettes    Start date: 04/26/1983    Quit date: 04/25/1993    Years since quitting: 30.5    Smokeless tobacco: Never  Vaping Use   Vaping status: Never Used  Substance and Sexual Activity   Alcohol use: No   Drug use: No   Sexual activity: Yes  Other Topics Concern   Not on file  Social History Narrative   Patient lives with his wife in [place].   Patent has 4 boys, one lives in [place] .    Patient enjoys fishing, camping in the woods, working outside, anything outdoor related.  Social Drivers of Corporate investment banker Strain: Low Risk  (08/21/2023)   Overall Financial Resource Strain (CARDIA)    Difficulty of Paying Living Expenses: Not very hard  Food Insecurity: No Food Insecurity (10/17/2023)   Hunger Vital Sign    Worried About Running Out of Food in the Last Year: Never true    Ran Out of Food in the Last Year: Never true  Transportation Needs: No Transportation Needs (10/17/2023)   PRAPARE - Administrator, Civil Service (Medical): No    Lack of Transportation (Non-Medical): No  Physical Activity: Inactive (08/21/2023)   Exercise Vital Sign    Days of Exercise per Week: 0 days    Minutes of Exercise per Session: 0 min  Stress: No Stress Concern Present (08/21/2023)   [employer] of Occupational Health - Occupational Stress Questionnaire    Feeling of Stress : Not at all  Social Connections: Moderately Integrated (10/17/2023)   Social Connection and Isolation Panel    Frequency of Communication with Friends and Family: More than three times a week    Frequency of Social Gatherings with Friends and Family: More than three times a week    Attends Religious Services: More than 4 times per year    Active Member of [employer] or Organizations: No    Attends Banker Meetings: Never    Marital Status: Married  Catering manager Violence: Not At Risk (10/17/2023)   Humiliation, Afraid, Rape, and Kick questionnaire    Fear of Current or Ex-Partner: No    Emotionally Abused: No    Physically Abused: No    Sexually Abused: No       Family History  Problem Relation Age of Onset   Heart disease Mother    Diabetes Mother    Hypertension Mother    Alcoholism Father    Heart disease Father    Hypertension Sister    Diabetes Brother    Hypercholesterolemia Brother    Osteoporosis Brother    Hypertension Brother    Kidney disease Brother     Vitals:   10/24/23 1341  BP: (!) 90/50  Pulse: 99  SpO2: 95%  Weight: 89.3 kg (196 lb 12.8 oz)     PHYSICAL EXAM: General:  Well appearing. No respiratory difficulty HEENT: normal Neck: supple. no JVD. Carotids 2+ bilat; no bruits. No lymphadenopathy or thyromegaly appreciated. Cor: PMI nondisplaced. Regular rate & rhythm. No rubs, gallops or murmurs. Lungs: clear Abdomen: soft, nontender, nondistended. No hepatosplenomegaly. No bruits or masses. Good bowel sounds. Extremities: no cyanosis, clubbing, rash, edema Neuro: alert & oriented x 3, cranial nerves grossly intact. moves all 4 extremities w/o difficulty. Affect pleasant.  ECG: NSR 99 bpm, personally reviewed    ASSESSMENT & PLAN:  1. Chronic Systolic Heart Failure Ischemic cardiomyopathy. Echo pre-CABG with EF 35-40%, RV hard to visualize. Difficulty separating from cardiopulmonary bypass following CABG 09/27/23. Required inotropic support for post-cardiotomy shock. Repeat Echo 09/30/23 EF 25-30%, RV not well visualized. Recovered and weaned off pressors.  - NYHA Class II. Grossly euvolemic on exam. Wt stable post d/c.  - Continue Torsemide  60 mg daily. Check BMP and BNP today  - GDMT limited by recent AKI - Continue Imdur  30 and hydralazine  50 tid. BP soft today, 90/60, but home SBPs have been in the 130s, per wife report. Will not titrate meds today  - if SCr remains elevated, may need to reduce hydral dose to help w/ renal perfusion  - will try  addition of other GDMT in the future as renal fx/BP permits     2. CAD: NSTEMI. Hx PCI to LAD in 2005.  NSTEMI 09/21/23 with multivessel disease on LHC. S/p CABG X 4  (LIMA to LAD, SVG to OM1, sequential to OM3, SVG to PDA) on 09/27/23 - stable w/ CP  - Continue ASA + plavix  75 mg daily - Continue atorva 80 mg daily. Check LP and HFTs in 6 wks   3. CKD IIIb:  - (b/l Scr 1.3-1.6) - developed AKI 2/2 ATN while hospitalized for CABG/ Cardiogenic shock. SCr rose > 4. He required short term CRRT for clerance and volume removal. Fortunately w/ renal recovery. SCr down to 2.5 by day of d/c  - check F/u BMP today    4. DM II: Uncontrolled, A1c 11.3% - insulin  - management per PCP  - no SGLT2i in yet w/ hgb A1c > 10 and recent AKI   F/u w/ pharmD in 3-4 wks for further med titration. W/u w/ Dr. Rolan and repeat echo in 2-3 months    Caffie Shed, PA-C 10/24/23

## 2023-10-25 ENCOUNTER — Other Ambulatory Visit (HOSPITAL_COMMUNITY)

## 2023-10-25 DIAGNOSIS — I255 Ischemic cardiomyopathy: Secondary | ICD-10-CM | POA: Diagnosis not present

## 2023-10-25 DIAGNOSIS — Z48812 Encounter for surgical aftercare following surgery on the circulatory system: Secondary | ICD-10-CM | POA: Diagnosis not present

## 2023-10-25 DIAGNOSIS — N179 Acute kidney failure, unspecified: Secondary | ICD-10-CM | POA: Diagnosis not present

## 2023-10-25 DIAGNOSIS — I214 Non-ST elevation (NSTEMI) myocardial infarction: Secondary | ICD-10-CM | POA: Diagnosis not present

## 2023-10-25 DIAGNOSIS — E1122 Type 2 diabetes mellitus with diabetic chronic kidney disease: Secondary | ICD-10-CM | POA: Diagnosis not present

## 2023-10-25 DIAGNOSIS — I130 Hypertensive heart and chronic kidney disease with heart failure and stage 1 through stage 4 chronic kidney disease, or unspecified chronic kidney disease: Secondary | ICD-10-CM | POA: Diagnosis not present

## 2023-10-25 DIAGNOSIS — I2510 Atherosclerotic heart disease of native coronary artery without angina pectoris: Secondary | ICD-10-CM | POA: Diagnosis not present

## 2023-10-25 DIAGNOSIS — I5023 Acute on chronic systolic (congestive) heart failure: Secondary | ICD-10-CM | POA: Diagnosis not present

## 2023-10-25 DIAGNOSIS — N183 Chronic kidney disease, stage 3 unspecified: Secondary | ICD-10-CM | POA: Diagnosis not present

## 2023-10-25 NOTE — Telephone Encounter (Signed)
 Reviewed and agree with Dr Macky Lower plan.

## 2023-10-26 ENCOUNTER — Ambulatory Visit: Admitting: General Practice

## 2023-10-26 DIAGNOSIS — N183 Chronic kidney disease, stage 3 unspecified: Secondary | ICD-10-CM | POA: Diagnosis not present

## 2023-10-26 DIAGNOSIS — I5023 Acute on chronic systolic (congestive) heart failure: Secondary | ICD-10-CM | POA: Diagnosis not present

## 2023-10-26 DIAGNOSIS — N179 Acute kidney failure, unspecified: Secondary | ICD-10-CM | POA: Diagnosis not present

## 2023-10-26 DIAGNOSIS — Z48812 Encounter for surgical aftercare following surgery on the circulatory system: Secondary | ICD-10-CM | POA: Diagnosis not present

## 2023-10-26 DIAGNOSIS — I255 Ischemic cardiomyopathy: Secondary | ICD-10-CM | POA: Diagnosis not present

## 2023-10-26 DIAGNOSIS — I214 Non-ST elevation (NSTEMI) myocardial infarction: Secondary | ICD-10-CM | POA: Diagnosis not present

## 2023-10-26 DIAGNOSIS — I2510 Atherosclerotic heart disease of native coronary artery without angina pectoris: Secondary | ICD-10-CM | POA: Diagnosis not present

## 2023-10-26 DIAGNOSIS — I130 Hypertensive heart and chronic kidney disease with heart failure and stage 1 through stage 4 chronic kidney disease, or unspecified chronic kidney disease: Secondary | ICD-10-CM | POA: Diagnosis not present

## 2023-10-26 DIAGNOSIS — E1122 Type 2 diabetes mellitus with diabetic chronic kidney disease: Secondary | ICD-10-CM | POA: Diagnosis not present

## 2023-10-26 MED ORDER — BASAGLAR KWIKPEN 100 UNIT/ML ~~LOC~~ SOPN
20.0000 [IU] | PEN_INJECTOR | Freq: Every day | SUBCUTANEOUS | 1 refills | Status: DC
Start: 2023-10-26 — End: 2023-11-02

## 2023-10-26 NOTE — Telephone Encounter (Signed)
 Reviewed and agree with Dr Macky Lower plan.

## 2023-10-26 NOTE — Telephone Encounter (Signed)
 Patient and spouse contacted for follow-up of glucose control and CGM results.  Appears no lows 7/1 or 7/2 however no readings since late on 10/25/2023  Contacted to determine if there was a supply of CGM sensors or if there was any other issue with CGM - Libre.  Patient's spouse reported that phone was not plugged in correctly and the power ran out.  They have just recharged his phone. Additional sensor available at home.   Since last contact patient and spouse report excellent glucose control without any symptoms of low glucose.  Patient reports he is doing very well.   Current Medications include:  Basaglar  (insulin  glargine) at 20 units once daily.   Medication Plan: -Continue Basaglar  (insulin  glargine) at same dose.   New prescription updated with [facility].  Previous supply available for pick-up in [facility] refrigerator was relabeled with current dose.   Plan for patient to pick-up at PCP visit on 7/10   Total time with patient call and documentation of interaction: 13 minutes.  F/U Phone call planned: none

## 2023-10-26 NOTE — Addendum Note (Signed)
 Addended by: Tuyet Bader G on: 10/26/2023 02:00 PM   Modules accepted: Orders

## 2023-10-30 ENCOUNTER — Other Ambulatory Visit (HOSPITAL_COMMUNITY): Payer: Self-pay

## 2023-10-30 ENCOUNTER — Telehealth (HOSPITAL_COMMUNITY): Payer: Self-pay

## 2023-10-30 DIAGNOSIS — I214 Non-ST elevation (NSTEMI) myocardial infarction: Secondary | ICD-10-CM | POA: Diagnosis not present

## 2023-10-30 DIAGNOSIS — I2510 Atherosclerotic heart disease of native coronary artery without angina pectoris: Secondary | ICD-10-CM | POA: Diagnosis not present

## 2023-10-30 DIAGNOSIS — N179 Acute kidney failure, unspecified: Secondary | ICD-10-CM | POA: Diagnosis not present

## 2023-10-30 DIAGNOSIS — Z48812 Encounter for surgical aftercare following surgery on the circulatory system: Secondary | ICD-10-CM | POA: Diagnosis not present

## 2023-10-30 DIAGNOSIS — I255 Ischemic cardiomyopathy: Secondary | ICD-10-CM | POA: Diagnosis not present

## 2023-10-30 DIAGNOSIS — I5023 Acute on chronic systolic (congestive) heart failure: Secondary | ICD-10-CM | POA: Diagnosis not present

## 2023-10-30 DIAGNOSIS — N183 Chronic kidney disease, stage 3 unspecified: Secondary | ICD-10-CM | POA: Diagnosis not present

## 2023-10-30 DIAGNOSIS — I130 Hypertensive heart and chronic kidney disease with heart failure and stage 1 through stage 4 chronic kidney disease, or unspecified chronic kidney disease: Secondary | ICD-10-CM | POA: Diagnosis not present

## 2023-10-30 DIAGNOSIS — E1122 Type 2 diabetes mellitus with diabetic chronic kidney disease: Secondary | ICD-10-CM | POA: Diagnosis not present

## 2023-10-30 NOTE — Telephone Encounter (Signed)
 Reached out to [facility] pharmacy for pt to get his meds from [facility] pharm [facility].  Pts wife had told me that she was told they need a new rx for them, but I was able to get [facility] to call [facility] pharm and get a transfer done and also told her that they all need to be filled once received.    Izetta Quivers, EMT-Paramedic  [phone] 10/30/2023

## 2023-10-30 NOTE — Progress Notes (Signed)
 Paramedicine Encounter    Patient ID: [name], male    DOB: December 27, 1950, 73 y.o.   MRN: [id number]   Complaints-tired, no energy   Edema-none   Compliance with meds-didn't take his meds a few nights ago b/c he felt bad  Pill box filled-yes  If so, by whom-wife  Refills needed-from TOC   Pt reports he is feeling ok, but he feels tired after he takes his morning meds and his afternoon-he thinks its the carvedilol .  He went out for a walk about prior to my arrival, his HR was elevated. I advised him to do those walks early in the morning or late in the evening to avoid the heat.   He does get sob if he does too much moving around.    I called the [facility] pharmacy to pull over his TOC meds to get filled. So they are working on that.  Wife gets alerts when meds are ready.   He denies dizziness, no c/p, sob upon exertion.   CBG -134 CBG's doing good. No recent lows like previously  Home b/p 132/70's per wife  BP 110/70   Pulse (!) 104   Resp 16   Wt 191 lb (86.6 kg)   SpO2 98%   BMI 32.79 kg/m  Weight yesterday-192 Last visit weight-196 @ clinic   Patient Care Team: Cleotilde Perkins, DO as PCP - General (Family Medicine) Mona Vinie BROCKS, MD as PCP - Cardiology (Cardiology) Weyman Corning, RN as [facility] Care Management [place], Eleanor, OD as Consulting Physician (Optometry)  Patient Active Problem List   Diagnosis Date Noted   Physical deconditioning 10/05/2023   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   Acute on chronic combined systolic and diastolic heart failure (HCC) 09/25/2023   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   Ischemic cardiomyopathy 09/23/2023   Type 2 diabetes mellitus with hyperglycemia, with long-term current use of insulin  (HCC) 09/23/2023   Type 2 diabetes mellitus with hyperlipidemia (HCC) 09/23/2023   AKI (acute kidney injury) (HCC) 09/23/2023   Benign hypertension with CKD (chronic kidney disease) stage III (HCC) 09/23/2023   Chronic health  problem 09/22/2023   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) 09/22/2023   NSTEMI (non-ST elevated myocardial infarction) (HCC) 09/21/2023   Type 2 diabetes mellitus with diabetic chronic kidney disease (HCC) 09/21/2023   CKD (chronic kidney disease) 09/21/2023   Pulmonary nodule 09/21/2023   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) 01/24/2022   Non-cardiac chest pain    Coronary artery disease involving native coronary artery of native heart without angina pectoris    Hypercholesterolemia    Atypical angina (HCC) 01/22/2022   Low-level of literacy 05/21/2018   Globus sensation 03/12/2018   Type 2 diabetes mellitus without complication, with long-term current use of insulin  (HCC) 06/09/2017   Pure hypercholesterolemia 06/09/2017   History of coronary artery stent placement 06/09/2017   Essential hypertension, benign 06/09/2017    Current Outpatient Medications:    Accu-Chek Softclix Lancets lancets, USE AS DIRECTED UP TO FOUR TIMES DAILY, Disp: 100 each, Rfl: 12   aspirin  EC 81 MG tablet, Take 81 mg by mouth daily., Disp: , Rfl:    atorvastatin  (LIPITOR ) 80 MG tablet, Take 1 tablet (80 mg total) by mouth daily., Disp: 60 tablet, Rfl: 1   BD PEN NEEDLE NANO 2ND GEN 32G X 4 MM MISC, USE 3 TIMES DAILY AS NEEDED BETWEEN MEALS AND BEDTIME, Disp: 100 each, Rfl: 2   blood glucose meter kit  and supplies, Dispense based on patient and insurance preference. Use up to four times daily as directed. (FOR ICD-10 E10.9, E11.9)., Disp: 1 each, Rfl: 0   Blood Glucose Monitoring Suppl (ACCU-CHEK GUIDE ME) w/Device KIT, Please use to check blood sugar level up to four times daily., Disp: 1 kit, Rfl: 0   carvedilol  (COREG ) 3.125 MG tablet, Take 1 tablet (3.125 mg total) by mouth 2 (two) times daily with a meal., Disp: 60 tablet, Rfl: 1   clopidogrel  (PLAVIX ) 75 MG tablet, Take 1 tablet (75 mg total) by mouth daily., Disp: 60 tablet, Rfl: 1   Continuous Glucose Sensor  (FREESTYLE LIBRE 3 SENSOR) MISC, 2 Devices by Does not apply route as needed. Place 1 sensor on the skin every 14 days. Use to check glucose continuously, Disp: 2 each, Rfl: 11   Fe Fum-Vit C-Vit B12-FA (TRIGELS-F FORTE) CAPS capsule, Take 1 capsule by mouth daily after breakfast. (Patient not taking: Reported on 10/24/2023), Disp: 90 capsule, Rfl: 3   glucose blood (ACCU-CHEK GUIDE TEST) test strip, Please use to check blood sugar up to 4 times daily., Disp: 100 each, Rfl: 12   hydrALAZINE  (APRESOLINE ) 50 MG tablet, Take 1 tablet (50 mg total) by mouth 3 (three) times daily., Disp: 90 tablet, Rfl: 1   Insulin  Glargine (BASAGLAR  KWIKPEN) 100 UNIT/ML, Inject 20 Units into the skin daily., Disp: 15 mL, Rfl: 1   isosorbide  mononitrate (IMDUR ) 30 MG 24 hr tablet, Take 1 tablet (30 mg total) by mouth daily., Disp: 30 tablet, Rfl: 1   metFORMIN  (GLUCOPHAGE ) 500 MG tablet, Take 1 tablet (500 mg total) by mouth 2 (two) times daily with a meal., Disp: , Rfl:    potassium chloride  SA (KLOR-CON  M) 20 MEQ tablet, Take 2 tablets (40 mEq total) by mouth daily., Disp: 60 tablet, Rfl: 1   torsemide  (DEMADEX ) 20 MG tablet, Take 3 tablets (60 mg total) by mouth daily., Disp: 90 tablet, Rfl: 1   traMADol  (ULTRAM ) 50 MG tablet, Take 1 tablet (50 mg total) by mouth every 6 (six) hours as needed for moderate pain (pain score 4-6)., Disp: 28 tablet, Rfl: 0 Allergies  Allergen Reactions   Trulicity [Dulaglutide] Other (See Comments)    Patient indicated this caused pancreatitis.       Social History   Socioeconomic History   Marital status: Married    Spouse name: [name]   Number of children: 4   Years of education: 10   Highest education level: 10th grade  Occupational History   Occupation: retired  Tobacco Use   Smoking status: Former    Current packs/day: 0.00    Average packs/day: 0.5 packs/day for 10.0 years (5.0 ttl pk-yrs)    Types: Cigarettes    Start date: 04/26/1983    Quit date: 04/25/1993    Years  since quitting: 30.5   Smokeless tobacco: Never  Vaping Use   Vaping status: Never Used  Substance and Sexual Activity   Alcohol use: No   Drug use: No   Sexual activity: Yes  Other Topics Concern   Not on file  Social History Narrative   Patient lives with his wife in [place].   Patent has 4 boys, one lives in [place] .    Patient enjoys fishing, camping in the woods, working outside, anything outdoor related.   Social Drivers of Corporate investment banker Strain: Low Risk  (08/21/2023)   Overall Financial Resource Strain (CARDIA)    Difficulty of Paying Living Expenses: Not very hard  Food Insecurity: No Food Insecurity (10/17/2023)   Hunger Vital Sign    Worried About Running Out of Food in the Last Year: Never true    Ran Out of Food in the Last Year: Never true  Transportation Needs: No Transportation Needs (10/17/2023)   PRAPARE - Administrator, Civil Service (Medical): No    Lack of Transportation (Non-Medical): No  Physical Activity: Inactive (08/21/2023)   Exercise Vital Sign    Days of Exercise per Week: 0 days    Minutes of Exercise per Session: 0 min  Stress: No Stress Concern Present (08/21/2023)   [employer] of Occupational Health - Occupational Stress Questionnaire    Feeling of Stress : Not at all  Social Connections: Moderately Integrated (10/17/2023)   Social Connection and Isolation Panel    Frequency of Communication with Friends and Family: More than three times a week    Frequency of Social Gatherings with Friends and Family: More than three times a week    Attends Religious Services: More than 4 times per year    Active Member of [employer] or Organizations: No    Attends Banker Meetings: Never    Marital Status: Married  Catering manager Violence: Not At Risk (10/17/2023)   Humiliation, Afraid, Rape, and Kick questionnaire    Fear of Current or Ex-Partner: No    Emotionally Abused: No    Physically Abused: No    Sexually  Abused: No    Physical Exam      Future Appointments  Date Time Provider Department Center  11/01/2023  9:00 AM [facility] [place] [facility] H&V  11/02/2023  1:30 PM Cleotilde Perkins, DO [facility]  11/03/2023  2:45 PM [facility] LAB [facility] None  11/21/2023  2:00 PM [facility] PHARMACY [facility] None  12/26/2023  2:00 PM MC ECHO OP 1 [facility]  12/26/2023  3:00 PM Rolan Ezra RAMAN, MD [facility] None  08/26/2024  2:20 PM [facility] ANNUAL WELLNESS VISIT [facility]       Izetta Quivers, Paramedic [phone] [facility] Paramedic  10/30/23

## 2023-10-31 ENCOUNTER — Other Ambulatory Visit: Payer: Self-pay | Admitting: Thoracic Surgery (Cardiothoracic Vascular Surgery)

## 2023-10-31 ENCOUNTER — Ambulatory Visit: Admitting: Thoracic Surgery (Cardiothoracic Vascular Surgery)

## 2023-10-31 DIAGNOSIS — Z951 Presence of aortocoronary bypass graft: Secondary | ICD-10-CM

## 2023-11-01 ENCOUNTER — Ambulatory Visit (HOSPITAL_COMMUNITY)
Admission: RE | Admit: 2023-11-01 | Discharge: 2023-11-01 | Disposition: A | Discharge status: Home / Self Care | Source: Ambulatory Visit | Attending: Cardiovascular Disease | Admitting: Cardiovascular Disease

## 2023-11-01 ENCOUNTER — Ambulatory Visit: Payer: Self-pay | Attending: Thoracic Surgery (Cardiothoracic Vascular Surgery) | Admitting: Physician Assistant

## 2023-11-01 VITALS — BP 150/82 | HR 100 | Resp 20 | Wt 195.0 lb

## 2023-11-01 DIAGNOSIS — I255 Ischemic cardiomyopathy: Secondary | ICD-10-CM | POA: Diagnosis not present

## 2023-11-01 DIAGNOSIS — I5023 Acute on chronic systolic (congestive) heart failure: Secondary | ICD-10-CM | POA: Diagnosis not present

## 2023-11-01 DIAGNOSIS — Z951 Presence of aortocoronary bypass graft: Secondary | ICD-10-CM | POA: Diagnosis not present

## 2023-11-01 DIAGNOSIS — E1122 Type 2 diabetes mellitus with diabetic chronic kidney disease: Secondary | ICD-10-CM | POA: Diagnosis not present

## 2023-11-01 DIAGNOSIS — I2510 Atherosclerotic heart disease of native coronary artery without angina pectoris: Secondary | ICD-10-CM | POA: Diagnosis not present

## 2023-11-01 DIAGNOSIS — N179 Acute kidney failure, unspecified: Secondary | ICD-10-CM | POA: Diagnosis not present

## 2023-11-01 DIAGNOSIS — Z48812 Encounter for surgical aftercare following surgery on the circulatory system: Secondary | ICD-10-CM | POA: Diagnosis not present

## 2023-11-01 DIAGNOSIS — I214 Non-ST elevation (NSTEMI) myocardial infarction: Secondary | ICD-10-CM | POA: Diagnosis not present

## 2023-11-01 DIAGNOSIS — J90 Pleural effusion, not elsewhere classified: Secondary | ICD-10-CM | POA: Diagnosis not present

## 2023-11-01 DIAGNOSIS — I130 Hypertensive heart and chronic kidney disease with heart failure and stage 1 through stage 4 chronic kidney disease, or unspecified chronic kidney disease: Secondary | ICD-10-CM | POA: Diagnosis not present

## 2023-11-01 DIAGNOSIS — N183 Chronic kidney disease, stage 3 unspecified: Secondary | ICD-10-CM | POA: Diagnosis not present

## 2023-11-01 NOTE — Patient Instructions (Addendum)
 You are encouraged to enroll and participate in the outpatient cardiac rehab program beginning as soon as practical.  2. You may return to driving an automobile as long as you are no longer requiring oral narcotic pain relievers during the daytime.  It would be wise to start driving only short distances during the daylight and gradually increase from there as you feel comfortable.  3. Continue to avoid any heavy lifting or strenuous use of your arms or shoulders for at least a total of three months from the time of surgery.  After two months you may gradually increase how much you lift or otherwise use your arms or chest as tolerated, with limits based upon whether or not activities lead to the return of significant discomfort.  4. Your HGA1C prior to surgery was 11.3. Make every effort to keep your diabetes under very tight control.  Follow up closely with your primary care physician or endocrinologist and strive to keep their hemoglobin A1c levels as low as possible, preferably near or below 6.0.  The long term benefits of strict control of diabetes are far reaching and critically important for your overall health and survival.

## 2023-11-02 ENCOUNTER — Ambulatory Visit: Admitting: Student

## 2023-11-02 ENCOUNTER — Encounter: Payer: Self-pay | Admitting: Student

## 2023-11-02 VITALS — BP 121/63 | HR 90 | Ht 64.0 in | Wt 194.2 lb

## 2023-11-02 DIAGNOSIS — E119 Type 2 diabetes mellitus without complications: Secondary | ICD-10-CM

## 2023-11-02 DIAGNOSIS — I10 Essential (primary) hypertension: Secondary | ICD-10-CM | POA: Diagnosis not present

## 2023-11-02 MED ORDER — BASAGLAR KWIKPEN 100 UNIT/ML ~~LOC~~ SOPN: 18.0000 [IU] | PEN_INJECTOR | Freq: Every day | SUBCUTANEOUS | Status: DC

## 2023-11-02 NOTE — Progress Notes (Signed)
    SUBJECTIVE:   CHIEF COMPLAINT / HPI:   [name] is a 73 y.o. male presenting for hospital follow up s/p CABG x4.   He has been followed closely by pharmacist Dr. Koval for insulin  adjustment following low readings from CGM. He is currently on 20 units daily. He is feeling good, continues to be active and has been taking his medications as prescribed per his wife.   PERTINENT  PMH / PSH: reviewed and updated.  OBJECTIVE:   BP 121/63   Pulse 90   Ht 5' 4 (1.626 m)   Wt 194 lb 3.2 oz (88.1 kg)   SpO2 98%   BMI 33.33 kg/m   Well-appearing, no acute distress Cardio: Regular rate, regular rhythm, no murmurs on exam. Pulm: Clear, no wheezing, no crackles. No increased work of breathing Abdominal: bowel sounds present, soft, non-tender, non-distended Extremities: no peripheral edema  Neuro: alert and oriented x3, speech normal in content, no facial asymmetry, strength intact and equal bilaterally in UE and LE, pupils equal and reactive to light.  Psych:  Cognition and judgment appear intact. Alert, communicative  and cooperative with normal attention span and concentration. No apparent delusions, illusions, hallucinations    ASSESSMENT/PLAN:   Assessment & Plan Type 2 diabetes mellitus without complication, without long-term current use of insulin  (HCC) Decrease insulin  to 18 units daily. Per CGM report, he continued ot have borderline low readings. Wife and patient in agreement with plan and will follow closely with Dr. Koval for further titration.  Basaglar  prescription given to patient today in clinic.  Essential hypertension, benign Well controlled today. No medication adjustments.  Continue Imdur  30 mg daily, Coreg  3.125 mg twice daily, hydralazine  50 mg 3 times daily     Damien Pinal, DO [facility]

## 2023-11-02 NOTE — Patient Instructions (Addendum)
 I am decreasing your insulin  to 18 units daily. Continue to follow with Dr. Koval for your medication adjustment.   For the vitamins you can take a multivitamin with iron :

## 2023-11-02 NOTE — Assessment & Plan Note (Addendum)
 Well controlled today. No medication adjustments.  Continue Imdur  30 mg daily, Coreg  3.125 mg twice daily, hydralazine  50 mg 3 times daily

## 2023-11-03 ENCOUNTER — Ambulatory Visit (HOSPITAL_COMMUNITY)
Admission: RE | Admit: 2023-11-03 | Discharge: 2023-11-03 | Disposition: A | Discharge status: Home / Self Care | Source: Ambulatory Visit | Attending: Cardiology | Admitting: Cardiology

## 2023-11-03 DIAGNOSIS — I5022 Chronic systolic (congestive) heart failure: Secondary | ICD-10-CM | POA: Insufficient documentation

## 2023-11-03 LAB — BASIC METABOLIC PANEL WITH GFR
Anion gap: 13 (ref 5–15)
BUN: 39 mg/dL — ABNORMAL HIGH (ref 8–23)
CO2: 27 mmol/L (ref 22–32)
Calcium: 9.7 mg/dL (ref 8.9–10.3)
Chloride: 100 mmol/L (ref 98–111)
Creatinine, Ser: 2.41 mg/dL — ABNORMAL HIGH (ref 0.61–1.24)
GFR, Estimated: 28 mL/min — ABNORMAL LOW (ref >60)
Glucose, Bld: 95 mg/dL (ref 70–99)
Potassium: 4.7 mmol/L (ref 3.5–5.1)
Sodium: 140 mmol/L (ref 135–145)

## 2023-11-06 ENCOUNTER — Ambulatory Visit (HOSPITAL_COMMUNITY): Payer: Self-pay | Admitting: Cardiology

## 2023-11-06 ENCOUNTER — Other Ambulatory Visit (HOSPITAL_COMMUNITY): Payer: Self-pay

## 2023-11-06 NOTE — Progress Notes (Signed)
 Paramedicine Encounter    Patient ID: [name], male    DOB: 04/04/1951, 73 y.o.   MRN: [id number]   Complaints-denies  Edema-none   Compliance with meds-missed a day and half of meds   Pill box filled-yes  If so, by whom-wife fills   Refills needed-yes meds from [facility] pharmacy-  Isosorbide , hydralazine , torsemide  and carvedilol  - I spoke to pharm and they will get all meds ready   Pt reports he has been doing ok, he he chose to not take his meds for a day and half, he said he was trying to wean himself off of it. He came up with that on his own. His wife advised him over the wknd about his stopping the meds like that and we had a discussion about that as well.  Wife has been keeping track of home b/p and HR.  Systolic ranges between 120-130's and HR elevated around 108.   Pt denies c/p, he reports some indigestion. He did not take anything for it, the indigestion went away on its own. He reports it lasted for about 10-51min. He ate some little ceasars pizza and he hasn't had that in quite some time and it can have a little spice to the sauce.   His HR elevated today, around 106-108.  EKG done but no changes from his previous EKG done in epic.  No c/p. No increased sob, no dizziness.   Sent message to triage ref HR.  He does have pharm visit on 7/29, but not sure if he needs to be seen sooner than that.  Will f/u.   Weight has been good around 191-193  BP 118/70   Pulse (!) 108   Resp 16   Wt 193 lb (87.5 kg)   SpO2 97%   BMI 33.13 kg/m  Weight yesterday--?didn't weigh yesterday  Last visit weight-191  Patient Care Team: Cleotilde Perkins, DO as PCP - General (Family Medicine) Mona Vinie BROCKS, MD as PCP - Cardiology (Cardiology) Weyman Corning, RN as [facility] Care Management [place], Eleanor, OD as Consulting Physician (Optometry)  Patient Active Problem List   Diagnosis Date Noted   Physical deconditioning 10/05/2023   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   Acute on  chronic combined systolic and diastolic heart failure (HCC) 09/25/2023   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   Type 2 diabetes mellitus with hyperglycemia, with long-term current use of insulin  (HCC) 09/23/2023   Type 2 diabetes mellitus with hyperlipidemia (HCC) 09/23/2023   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) 09/22/2023   NSTEMI (non-ST elevated myocardial infarction) (HCC) 09/21/2023   Type 2 diabetes mellitus with diabetic chronic kidney disease (HCC) 09/21/2023   CKD (chronic kidney disease) 09/21/2023   Coronary artery disease involving native coronary artery of native heart without angina pectoris    Hypercholesterolemia    Low-level of literacy 05/21/2018   Globus sensation 03/12/2018   History of coronary artery stent placement 06/09/2017   Essential hypertension, benign 06/09/2017    Current Outpatient Medications:    Accu-Chek Softclix Lancets lancets, USE AS DIRECTED UP TO FOUR TIMES DAILY, Disp: 100 each, Rfl: 12   aspirin  EC 81 MG tablet, Take 81 mg by mouth daily., Disp: , Rfl:    atorvastatin  (LIPITOR ) 80 MG tablet, Take 1 tablet (80 mg total) by mouth daily., Disp: 60 tablet, Rfl: 1   BD PEN NEEDLE NANO 2ND GEN 32G X 4 MM MISC, USE 3 TIMES DAILY AS NEEDED BETWEEN MEALS AND BEDTIME, Disp: 100  each, Rfl: 2   blood glucose meter kit and supplies, Dispense based on patient and insurance preference. Use up to four times daily as directed. (FOR ICD-10 E10.9, E11.9)., Disp: 1 each, Rfl: 0   Blood Glucose Monitoring Suppl (ACCU-CHEK GUIDE ME) w/Device KIT, Please use to check blood sugar level up to four times daily., Disp: 1 kit, Rfl: 0   carvedilol  (COREG ) 3.125 MG tablet, Take 1 tablet (3.125 mg total) by mouth 2 (two) times daily with a meal., Disp: 60 tablet, Rfl: 1   clopidogrel  (PLAVIX ) 75 MG tablet, Take 1 tablet (75 mg total) by mouth daily., Disp: 60 tablet, Rfl: 1   Continuous Glucose Sensor (FREESTYLE LIBRE 3 SENSOR) MISC, 2 Devices by Does not  apply route as needed. Place 1 sensor on the skin every 14 days. Use to check glucose continuously, Disp: 2 each, Rfl: 11   Fe Fum-Vit C-Vit B12-FA (TRIGELS-F FORTE) CAPS capsule, Take 1 capsule by mouth daily after breakfast., Disp: 90 capsule, Rfl: 3   glucose blood (ACCU-CHEK GUIDE TEST) test strip, Please use to check blood sugar up to 4 times daily., Disp: 100 each, Rfl: 12   hydrALAZINE  (APRESOLINE ) 50 MG tablet, Take 1 tablet (50 mg total) by mouth 3 (three) times daily., Disp: 90 tablet, Rfl: 1   Insulin  Glargine (BASAGLAR  KWIKPEN) 100 UNIT/ML, Inject 18 Units into the skin daily., Disp: , Rfl:    isosorbide  mononitrate (IMDUR ) 30 MG 24 hr tablet, Take 1 tablet (30 mg total) by mouth daily., Disp: 30 tablet, Rfl: 1   metFORMIN  (GLUCOPHAGE ) 500 MG tablet, Take 1 tablet (500 mg total) by mouth 2 (two) times daily with a meal., Disp: , Rfl:    potassium chloride  SA (KLOR-CON  M) 20 MEQ tablet, Take 2 tablets (40 mEq total) by mouth daily., Disp: 60 tablet, Rfl: 1   torsemide  (DEMADEX ) 20 MG tablet, Take 3 tablets (60 mg total) by mouth daily., Disp: 90 tablet, Rfl: 1   traMADol  (ULTRAM ) 50 MG tablet, Take 1 tablet (50 mg total) by mouth every 6 (six) hours as needed for moderate pain (pain score 4-6)., Disp: 28 tablet, Rfl: 0 Allergies  Allergen Reactions   Trulicity [Dulaglutide] Other (See Comments)    Patient indicated this caused pancreatitis.       Social History   Socioeconomic History   Marital status: Married    Spouse name: [name]   Number of children: 4   Years of education: 10   Highest education level: 10th grade  Occupational History   Occupation: retired  Tobacco Use   Smoking status: Former    Current packs/day: 0.00    Average packs/day: 0.5 packs/day for 10.0 years (5.0 ttl pk-yrs)    Types: Cigarettes    Start date: 04/26/1983    Quit date: 04/25/1993    Years since quitting: 30.5   Smokeless tobacco: Never  Vaping Use   Vaping status: Never Used  Substance  and Sexual Activity   Alcohol use: No   Drug use: No   Sexual activity: Yes  Other Topics Concern   Not on file  Social History Narrative   Patient lives with his wife in [place].   Patent has 4 boys, one lives in [place] .    Patient enjoys fishing, camping in the woods, working outside, anything outdoor related.   Social Drivers of Health   Financial Resource Strain: Medium Risk (10/31/2023)   Overall Financial Resource Strain (CARDIA)    Difficulty of Paying Living Expenses: Somewhat hard  Food Insecurity: Food Insecurity Present (10/31/2023)   Hunger Vital Sign    Worried About Running Out of Food in the Last Year: Sometimes true    Ran Out of Food in the Last Year: Sometimes true  Transportation Needs: No Transportation Needs (10/31/2023)   PRAPARE - Administrator, Civil Service (Medical): No    Lack of Transportation (Non-Medical): No  Physical Activity: Inactive (10/31/2023)   Exercise Vital Sign    Days of Exercise per Week: 0 days    Minutes of Exercise per Session: Not on file  Stress: No Stress Concern Present (10/31/2023)   [employer] of Occupational Health - Occupational Stress Questionnaire    Feeling of Stress: Only a little  Social Connections: Moderately Integrated (10/31/2023)   Social Connection and Isolation Panel    Frequency of Communication with Friends and Family: More than three times a week    Frequency of Social Gatherings with Friends and Family: Patient declined    Attends Religious Services: 1 to 4 times per year    Active Member of [employer] or Organizations: No    Attends Banker Meetings: Not on file    Marital Status: Married  Intimate Partner Violence: Not At Risk (10/17/2023)   Humiliation, Afraid, Rape, and Kick questionnaire    Fear of Current or Ex-Partner: No    Emotionally Abused: No    Physically Abused: No    Sexually Abused: No    Physical Exam      Future Appointments  Date Time Provider Department  Center  11/21/2023  2:00 PM [facility] PHARMACY [facility] None  12/26/2023  2:00 PM MC ECHO OP 1 [facility]  12/26/2023  3:00 PM Rolan Ezra RAMAN, MD [facility] None  08/26/2024  2:20 PM [facility] ANNUAL WELLNESS VISIT [facility]       Izetta Quivers, Paramedic [phone] [facility] Paramedic  11/06/23

## 2023-11-07 DIAGNOSIS — E1122 Type 2 diabetes mellitus with diabetic chronic kidney disease: Secondary | ICD-10-CM | POA: Diagnosis not present

## 2023-11-07 DIAGNOSIS — I214 Non-ST elevation (NSTEMI) myocardial infarction: Secondary | ICD-10-CM | POA: Diagnosis not present

## 2023-11-07 DIAGNOSIS — I2510 Atherosclerotic heart disease of native coronary artery without angina pectoris: Secondary | ICD-10-CM | POA: Diagnosis not present

## 2023-11-07 DIAGNOSIS — I130 Hypertensive heart and chronic kidney disease with heart failure and stage 1 through stage 4 chronic kidney disease, or unspecified chronic kidney disease: Secondary | ICD-10-CM | POA: Diagnosis not present

## 2023-11-07 DIAGNOSIS — I5023 Acute on chronic systolic (congestive) heart failure: Secondary | ICD-10-CM | POA: Diagnosis not present

## 2023-11-07 DIAGNOSIS — Z48812 Encounter for surgical aftercare following surgery on the circulatory system: Secondary | ICD-10-CM | POA: Diagnosis not present

## 2023-11-07 DIAGNOSIS — N179 Acute kidney failure, unspecified: Secondary | ICD-10-CM | POA: Diagnosis not present

## 2023-11-07 DIAGNOSIS — N183 Chronic kidney disease, stage 3 unspecified: Secondary | ICD-10-CM | POA: Diagnosis not present

## 2023-11-07 DIAGNOSIS — I255 Ischemic cardiomyopathy: Secondary | ICD-10-CM | POA: Diagnosis not present

## 2023-11-08 ENCOUNTER — Other Ambulatory Visit: Payer: Self-pay | Admitting: Family Medicine

## 2023-11-08 DIAGNOSIS — E119 Type 2 diabetes mellitus without complications: Secondary | ICD-10-CM

## 2023-11-13 ENCOUNTER — Other Ambulatory Visit (HOSPITAL_COMMUNITY): Payer: Self-pay

## 2023-11-13 ENCOUNTER — Telehealth: Payer: Self-pay | Admitting: Pharmacist

## 2023-11-13 DIAGNOSIS — E119 Type 2 diabetes mellitus without complications: Secondary | ICD-10-CM

## 2023-11-13 MED ORDER — METFORMIN HCL ER 500 MG PO TB24
500.0000 mg | ORAL_TABLET | Freq: Two times a day (BID) | ORAL | 3 refills | Status: DC
Start: 2023-11-13 — End: 2023-12-19

## 2023-11-13 NOTE — Telephone Encounter (Signed)
 Patient and Spouse contacted for follow-up of request for metformin  continuation / refill.   At last visit, spouse states that no discussion on changing metformin . She reports that patient is now taking 500mg  BID.  (Continues to take 18 units of Lantus  (insulin  glargine) daily).   eGFR is > 30  Control is excellent based on CGM Average: 129 GMI: 6.4 TIR: 87% Lowe readings: 0%  I agreed to continue metformin  at this time, as Dr. Cleotilde is out of the office this week.   Medication Plan: - Continued Metformin  500mg  BID - converted to XR formulation, refill provided.     Total time with patient call and documentation of interaction: 10 minutes.  F/U Phone call planned: None

## 2023-11-13 NOTE — Progress Notes (Signed)
 Paramedicine Encounter    Patient ID: [name], male    DOB: August 23, 1950, 73 y.o.   MRN: [id number]   Complaints-none but wife reported complaints over the wknd  Edema-none   Compliance with meds-occasionally disagrees with his wife about whether or not he's taken them.   Pill box filled-yes If so, by whom-wife   Refills needed-metformin -dr refused request from pharm      Wife reports that he is still struggling with taking all his meds.  She reports that his b/p has been high around 160/90s a few times. Mostly they have been in the 114-120 systolic. HR consistently low 100s.  But pill box lines up with doses taken, other than 1-2 doses of his afternoon and bedtime. He doesn't always take them at same time and wife urges him to do it that way.  He reports he had c/p with sob yesterday. Took a pain pill and it subsided. He reports pain started at rest, he thinks it was the OJ and some [employer].  He reports pain was left side mid abd area. Pain did not move anywhere, isolated to that area. Pt reports it lasted about 10-68min and went away with the tramadol .    -she has issue with pharmacy not getting his metformin  refilled- it looks like they sent over an old rx of 1000mg  BID instead of the 500mg  BID. Sent message to dr amalia to see if he can assist with this. He has enough for this week.   He also reports that he has been having dark stools-that started when he started that vitamin.  Pt reports appetite good.  Denies n/v.  Denies dizziness.   Reviewed upcoming appointments with him.   CBG's- 120-130s BP 128/82   Pulse 99   Resp 16   Wt 190 lb (86.2 kg)   SpO2 96%   BMI 32.61 kg/m  Weight yesterday-190 Last visit weight-193  Patient Care Team: Cleotilde Perkins, DO as PCP - General (Family Medicine) Mona Vinie BROCKS, MD as PCP - Cardiology (Cardiology) Weyman Corning, RN as [facility] Care Management Heisler, Eleanor, OD as Consulting Physician (Optometry)  Patient  Active Problem List   Diagnosis Date Noted   Physical deconditioning 10/05/2023   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   Acute on chronic combined systolic and diastolic heart failure (HCC) 09/25/2023   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   Type 2 diabetes mellitus with hyperglycemia, with long-term current use of insulin  (HCC) 09/23/2023   Type 2 diabetes mellitus with hyperlipidemia (HCC) 09/23/2023   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) 09/22/2023   NSTEMI (non-ST elevated myocardial infarction) (HCC) 09/21/2023   Type 2 diabetes mellitus with diabetic chronic kidney disease (HCC) 09/21/2023   CKD (chronic kidney disease) 09/21/2023   Coronary artery disease involving native coronary artery of native heart without angina pectoris    Hypercholesterolemia    Low-level of literacy 05/21/2018   Globus sensation 03/12/2018   History of coronary artery stent placement 06/09/2017   Essential hypertension, benign 06/09/2017    Current Outpatient Medications:    Accu-Chek Softclix Lancets lancets, USE AS DIRECTED UP TO FOUR TIMES DAILY, Disp: 100 each, Rfl: 12   aspirin  EC 81 MG tablet, Take 81 mg by mouth daily., Disp: , Rfl:    atorvastatin  (LIPITOR ) 80 MG tablet, Take 1 tablet (80 mg total) by mouth daily., Disp: 60 tablet, Rfl: 1   BD PEN NEEDLE NANO 2ND GEN 32G X 4 MM MISC, USE 3 TIMES DAILY  AS NEEDED BETWEEN MEALS AND BEDTIME, Disp: 100 each, Rfl: 2   blood glucose meter kit and supplies, Dispense based on patient and insurance preference. Use up to four times daily as directed. (FOR ICD-10 E10.9, E11.9)., Disp: 1 each, Rfl: 0   Blood Glucose Monitoring Suppl (ACCU-CHEK GUIDE ME) w/Device KIT, Please use to check blood sugar level up to four times daily., Disp: 1 kit, Rfl: 0   carvedilol  (COREG ) 3.125 MG tablet, Take 1 tablet (3.125 mg total) by mouth 2 (two) times daily with a meal., Disp: 60 tablet, Rfl: 1   clopidogrel  (PLAVIX ) 75 MG tablet, Take 1 tablet (75 mg total) by  mouth daily., Disp: 60 tablet, Rfl: 1   Continuous Glucose Sensor (FREESTYLE LIBRE 3 SENSOR) MISC, 2 Devices by Does not apply route as needed. Place 1 sensor on the skin every 14 days. Use to check glucose continuously, Disp: 2 each, Rfl: 11   Fe Fum-Vit C-Vit B12-FA (TRIGELS-F FORTE) CAPS capsule, Take 1 capsule by mouth daily after breakfast., Disp: 90 capsule, Rfl: 3   glucose blood (ACCU-CHEK GUIDE TEST) test strip, Please use to check blood sugar up to 4 times daily., Disp: 100 each, Rfl: 12   hydrALAZINE  (APRESOLINE ) 50 MG tablet, Take 1 tablet (50 mg total) by mouth 3 (three) times daily., Disp: 90 tablet, Rfl: 1   Insulin  Glargine (BASAGLAR  KWIKPEN) 100 UNIT/ML, Inject 18 Units into the skin daily., Disp: , Rfl:    isosorbide  mononitrate (IMDUR ) 30 MG 24 hr tablet, Take 1 tablet (30 mg total) by mouth daily., Disp: 30 tablet, Rfl: 1   metFORMIN  (GLUCOPHAGE ) 500 MG tablet, Take 1 tablet (500 mg total) by mouth 2 (two) times daily with a meal., Disp: , Rfl:    potassium chloride  SA (KLOR-CON  M) 20 MEQ tablet, Take 2 tablets (40 mEq total) by mouth daily., Disp: 60 tablet, Rfl: 1   torsemide  (DEMADEX ) 20 MG tablet, Take 3 tablets (60 mg total) by mouth daily., Disp: 90 tablet, Rfl: 1   traMADol  (ULTRAM ) 50 MG tablet, Take 1 tablet (50 mg total) by mouth every 6 (six) hours as needed for moderate pain (pain score 4-6)., Disp: 28 tablet, Rfl: 0 Allergies  Allergen Reactions   Trulicity [Dulaglutide] Other (See Comments)    Patient indicated this caused pancreatitis.       Social History   Socioeconomic History   Marital status: Married    Spouse name: [name]   Number of children: 4   Years of education: 10   Highest education level: 10th grade  Occupational History   Occupation: retired  Tobacco Use   Smoking status: Former    Current packs/day: 0.00    Average packs/day: 0.5 packs/day for 10.0 years (5.0 ttl pk-yrs)    Types: Cigarettes    Start date: 04/26/1983    Quit date:  04/25/1993    Years since quitting: 30.5   Smokeless tobacco: Never  Vaping Use   Vaping status: Never Used  Substance and Sexual Activity   Alcohol use: No   Drug use: No   Sexual activity: Yes  Other Topics Concern   Not on file  Social History Narrative   Patient lives with his wife in [place].   Patent has 4 boys, one lives in [place] .    Patient enjoys fishing, camping in the woods, working outside, anything outdoor related.   Social Drivers of Health   Financial Resource Strain: Medium Risk (10/31/2023)   Overall Financial Resource Strain (CARDIA)  Difficulty of Paying Living Expenses: Somewhat hard  Food Insecurity: Food Insecurity Present (10/31/2023)   Hunger Vital Sign    Worried About Running Out of Food in the Last Year: Sometimes true    Ran Out of Food in the Last Year: Sometimes true  Transportation Needs: No Transportation Needs (10/31/2023)   PRAPARE - Administrator, Civil Service (Medical): No    Lack of Transportation (Non-Medical): No  Physical Activity: Inactive (10/31/2023)   Exercise Vital Sign    Days of Exercise per Week: 0 days    Minutes of Exercise per Session: Not on file  Stress: No Stress Concern Present (10/31/2023)   [employer] of Occupational Health - Occupational Stress Questionnaire    Feeling of Stress: Only a little  Social Connections: Moderately Integrated (10/31/2023)   Social Connection and Isolation Panel    Frequency of Communication with Friends and Family: More than three times a week    Frequency of Social Gatherings with Friends and Family: Patient declined    Attends Religious Services: 1 to 4 times per year    Active Member of [employer] or Organizations: No    Attends Banker Meetings: Not on file    Marital Status: Married  Intimate Partner Violence: Not At Risk (10/17/2023)   Humiliation, Afraid, Rape, and Kick questionnaire    Fear of Current or Ex-Partner: No    Emotionally Abused: No    Physically  Abused: No    Sexually Abused: No    Physical Exam      Future Appointments  Date Time Provider Department Center  11/21/2023  2:00 PM [facility] PHARMACY [facility] None  12/26/2023  2:00 PM [facility] ECHO OP 1 [facility]  12/26/2023  3:00 PM Rolan Ezra RAMAN, MD [facility] None  08/26/2024  2:20 PM [facility] ANNUAL WELLNESS VISIT [facility]       Izetta Quivers, Paramedic [phone] [facility] Paramedic  11/13/23

## 2023-11-13 NOTE — Telephone Encounter (Signed)
-----   Message from [facility] Katie L sent at 11/13/2023  3:40 PM EDT ----- Regarding: rx Hey!  Pharmacy is not able refill his metformin , are you able to send in the rx for that?    Thanks, Izetta Quivers, EMT-Paramedic  [phone] 11/13/2023

## 2023-11-14 ENCOUNTER — Telehealth (HOSPITAL_COMMUNITY): Payer: Self-pay

## 2023-11-14 DIAGNOSIS — N179 Acute kidney failure, unspecified: Secondary | ICD-10-CM | POA: Diagnosis not present

## 2023-11-14 DIAGNOSIS — E1122 Type 2 diabetes mellitus with diabetic chronic kidney disease: Secondary | ICD-10-CM | POA: Diagnosis not present

## 2023-11-14 DIAGNOSIS — I5023 Acute on chronic systolic (congestive) heart failure: Secondary | ICD-10-CM | POA: Diagnosis not present

## 2023-11-14 DIAGNOSIS — I130 Hypertensive heart and chronic kidney disease with heart failure and stage 1 through stage 4 chronic kidney disease, or unspecified chronic kidney disease: Secondary | ICD-10-CM | POA: Diagnosis not present

## 2023-11-14 DIAGNOSIS — Z48812 Encounter for surgical aftercare following surgery on the circulatory system: Secondary | ICD-10-CM | POA: Diagnosis not present

## 2023-11-14 DIAGNOSIS — N183 Chronic kidney disease, stage 3 unspecified: Secondary | ICD-10-CM | POA: Diagnosis not present

## 2023-11-14 DIAGNOSIS — I214 Non-ST elevation (NSTEMI) myocardial infarction: Secondary | ICD-10-CM | POA: Diagnosis not present

## 2023-11-14 DIAGNOSIS — I2510 Atherosclerotic heart disease of native coronary artery without angina pectoris: Secondary | ICD-10-CM | POA: Diagnosis not present

## 2023-11-14 DIAGNOSIS — I255 Ischemic cardiomyopathy: Secondary | ICD-10-CM | POA: Diagnosis not present

## 2023-11-14 NOTE — Telephone Encounter (Signed)
 Reviewed and agree with Dr Macky Lower plan.

## 2023-11-14 NOTE — Telephone Encounter (Signed)
 Attempted to call patient regarding cardiac rehab- no answer, left message. Sent MyChart message.

## 2023-11-15 DIAGNOSIS — Z48812 Encounter for surgical aftercare following surgery on the circulatory system: Secondary | ICD-10-CM | POA: Diagnosis not present

## 2023-11-15 DIAGNOSIS — I2510 Atherosclerotic heart disease of native coronary artery without angina pectoris: Secondary | ICD-10-CM | POA: Diagnosis not present

## 2023-11-15 DIAGNOSIS — I255 Ischemic cardiomyopathy: Secondary | ICD-10-CM | POA: Diagnosis not present

## 2023-11-15 DIAGNOSIS — I130 Hypertensive heart and chronic kidney disease with heart failure and stage 1 through stage 4 chronic kidney disease, or unspecified chronic kidney disease: Secondary | ICD-10-CM | POA: Diagnosis not present

## 2023-11-15 DIAGNOSIS — N183 Chronic kidney disease, stage 3 unspecified: Secondary | ICD-10-CM | POA: Diagnosis not present

## 2023-11-15 DIAGNOSIS — I214 Non-ST elevation (NSTEMI) myocardial infarction: Secondary | ICD-10-CM | POA: Diagnosis not present

## 2023-11-15 DIAGNOSIS — I5023 Acute on chronic systolic (congestive) heart failure: Secondary | ICD-10-CM | POA: Diagnosis not present

## 2023-11-15 DIAGNOSIS — N179 Acute kidney failure, unspecified: Secondary | ICD-10-CM | POA: Diagnosis not present

## 2023-11-15 DIAGNOSIS — E1122 Type 2 diabetes mellitus with diabetic chronic kidney disease: Secondary | ICD-10-CM | POA: Diagnosis not present

## 2023-11-20 DIAGNOSIS — N1831 Chronic kidney disease, stage 3a: Secondary | ICD-10-CM | POA: Diagnosis not present

## 2023-11-20 DIAGNOSIS — E1122 Type 2 diabetes mellitus with diabetic chronic kidney disease: Secondary | ICD-10-CM | POA: Diagnosis not present

## 2023-11-20 DIAGNOSIS — I129 Hypertensive chronic kidney disease with stage 1 through stage 4 chronic kidney disease, or unspecified chronic kidney disease: Secondary | ICD-10-CM | POA: Diagnosis not present

## 2023-11-20 DIAGNOSIS — I2510 Atherosclerotic heart disease of native coronary artery without angina pectoris: Secondary | ICD-10-CM | POA: Diagnosis not present

## 2023-11-20 DIAGNOSIS — E785 Hyperlipidemia, unspecified: Secondary | ICD-10-CM | POA: Diagnosis not present

## 2023-11-20 NOTE — Progress Notes (Incomplete)
 ***In Progress***    Advanced Heart Failure Clinic Note  PCP: Cleotilde Perkins, DO PCP-Cardiologist: Vinie JAYSON Maxcy, MD  AHF MD: Dr. Rolan   HPI: [name] is a 73 y.o. 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    He was admitted 09/21/23 with epigastric chest pain radiating to chest and neck. HS troponin elevated to 673. BP markedly elevated. Echo EF 35-40%, RV not well visualized. Cardiac cath with severe 3 v CAD. LVEDP 38-40 mmHg. He was diuresed with IV lasix  and started on GDMT.  He underwent CABG X 4 (LIMA to LAD, SVG to OM1, sequential to OM3, SVG to PDA) by Dr. Kerrin on 09/27/2023. There was significant difficulty separating from cardiopulmonary bypass (took 3 attempts). Transferred to the ICU on inotrope/pressors. AHF team consulted to assist w/ post cardiotomy shock. Course was further c/b AKI 2/2 ATN. Scr rose to > 4 (b/l 1.3-1.6). He was started on CRRT for clearance and volume removal. Fortunately, he had renal recovery and able to wean off of CRRT. Also able to wean off pressors/inotropes. He was discharged home on 10/13/2023. Discharge weight 195 lbs. HF regimen at discharge was carvedilol  3.125 mg BID, hydralazine  50 mg TID, isosorbide  mononitrate 30 mg daily, and torsemide  60 mg daily.   On 10/24/2023, he presented for post hospital follow up with wife and paramedicine, Izetta. He reported doing well. Weight was stable at 196 lb. EKG showed NSR 99 bpm. His BP was soft, 90/50 but he denied orthostatic symptoms. Wife said home BP readings have been in the 130s. He reported good urine output with torsemide . Urine was yellow. Stable without CP.   Today he returns to HF clinic for pharmacist medication titration.    Overall feeling ***. Dizziness, lightheadedness, fatigue:  Chest pain or palpitations:  How is your breathing?: *** SOB: Able to complete all ADLs. Activity level  ***  Weight at home pounds. Takes furosemide /torsemide /bumex *** mg *** daily.  LEE PND/Orthopnea  Appetite *** Low-salt diet:   Physical Exam Cost/affordability of meds   HF Medications: Carvedilol  3.125 mg BID Hydralazine  50 mg TID  Isosorbide  mononitrate 30 mg daily  Torsemide  60 mg daily  Has the patient been experiencing any side effects to the medications prescribed?  {YES NO:22349}  Does the patient have any problems obtaining medications due to transportation or finances?   {YES NO:22349}  Understanding of regimen: {excellent/good/fair/poor:19665} Understanding of indications: {excellent/good/fair/poor:19665} Potential of compliance: {excellent/good/fair/poor:19665} Patient understands to avoid NSAIDs. Patient understands to avoid decongestants.    Pertinent Lab Values: 11/03/2023: Serum creatinine 2.41, BUN 39, Potassium 4.7, Sodium 140, BNP 78.8 (10/24/2023)  Vital Signs: Weight: *** lbs (last clinic weight: 196.8 lbs) Blood pressure: *** mmHg Heart rate: *** bpm  Assessment/Plan: 1. Chronic Systolic Heart Failure  Ischemic cardiomyopathy. Echo pre-CABG with EF 35-40%, RV hard to visualize. Difficulty separating from cardiopulmonary bypass following CABG 09/27/2023. Required inotropic support for post-cardiotomy shock. Repeat Echo 09/30/2023 EF 25-30%, RV not well visualized. Recovered and weaned off pressors.  - NYHA Class ***. Volume ***  - Continue Torsemide  60 mg daily. - Continue carvedilol  3.125 mg BID - Continue hydralazine  50 mg TID  - Continue isosorbide  mononitrate 30 mg daily  - GDMT limited by recent AKI   - if SCr remains elevated, may need to reduce hydral dose to help w/ renal perfusion *** - will try addition of other GDMT in the future  as renal fx/BP permits ***    2. CAD: NSTEMI. Hx PCI to LAD in 2005. NSTEMI 09/21/2023 with multivessel disease on LHC. S/p CABG X 4 (LIMA to LAD, SVG to OM1, sequential to OM3, SVG to PDA) on 09/27/2023 - stable  w/ CP *** - Continue aspirin  81 mg daily + Plavix  75 mg daily - Continue atorvastatin  80 mg daily   3. CKD IIIb:  - (b/l Scr 1.3-1.6) - developed AKI 2/2 ATN while hospitalized for CABG/ Cardiogenic shock. SCr rose > 4. He required short term CRRT for clerance and volume removal. Fortunately w/ renal recovery. SCr down to 2.5 by day of discharge - check F/u BMP today    4. DM II:  - Uncontrolled, A1c 11.3% - Insulin  - Management per PCP  - No SGLT2i w/ Hgb A1c > 10 and recent AKI   Follow up with    Tinnie Redman, PharmD, BCPS, BCCP, CPP Heart Failure Clinic Pharmacist [phone]

## 2023-11-21 ENCOUNTER — Inpatient Hospital Stay (HOSPITAL_COMMUNITY)
Admission: RE | Admit: 2023-11-21 | Discharge: 2023-11-21 | Disposition: A | Discharge status: Home / Self Care | Source: Ambulatory Visit | Attending: Cardiology

## 2023-11-21 ENCOUNTER — Other Ambulatory Visit (HOSPITAL_COMMUNITY): Payer: Self-pay

## 2023-11-21 VITALS — BP 154/84 | HR 105 | Wt 195.4 lb

## 2023-11-21 DIAGNOSIS — I2510 Atherosclerotic heart disease of native coronary artery without angina pectoris: Secondary | ICD-10-CM | POA: Diagnosis not present

## 2023-11-21 DIAGNOSIS — Z955 Presence of coronary angioplasty implant and graft: Secondary | ICD-10-CM | POA: Insufficient documentation

## 2023-11-21 DIAGNOSIS — I5022 Chronic systolic (congestive) heart failure: Secondary | ICD-10-CM | POA: Diagnosis not present

## 2023-11-21 DIAGNOSIS — I252 Old myocardial infarction: Secondary | ICD-10-CM | POA: Diagnosis not present

## 2023-11-21 DIAGNOSIS — E1122 Type 2 diabetes mellitus with diabetic chronic kidney disease: Secondary | ICD-10-CM | POA: Diagnosis not present

## 2023-11-21 DIAGNOSIS — Z7982 Long term (current) use of aspirin: Secondary | ICD-10-CM | POA: Insufficient documentation

## 2023-11-21 DIAGNOSIS — E1165 Type 2 diabetes mellitus with hyperglycemia: Secondary | ICD-10-CM | POA: Insufficient documentation

## 2023-11-21 DIAGNOSIS — Z951 Presence of aortocoronary bypass graft: Secondary | ICD-10-CM | POA: Insufficient documentation

## 2023-11-21 DIAGNOSIS — I130 Hypertensive heart and chronic kidney disease with heart failure and stage 1 through stage 4 chronic kidney disease, or unspecified chronic kidney disease: Secondary | ICD-10-CM | POA: Insufficient documentation

## 2023-11-21 DIAGNOSIS — E785 Hyperlipidemia, unspecified: Secondary | ICD-10-CM | POA: Diagnosis not present

## 2023-11-21 DIAGNOSIS — N1832 Chronic kidney disease, stage 3b: Secondary | ICD-10-CM | POA: Diagnosis not present

## 2023-11-21 DIAGNOSIS — Z79899 Other long term (current) drug therapy: Secondary | ICD-10-CM | POA: Insufficient documentation

## 2023-11-21 MED ORDER — POTASSIUM CHLORIDE CRYS ER 20 MEQ PO TBCR: 20.0000 meq | EXTENDED_RELEASE_TABLET | Freq: Every day | ORAL | 3 refills | Status: DC

## 2023-11-21 MED ORDER — CARVEDILOL 6.25 MG PO TABS: 6.2500 mg | ORAL_TABLET | Freq: Two times a day (BID) | ORAL | 5 refills | Status: DC

## 2023-11-21 NOTE — Patient Instructions (Signed)
 It was a pleasure seeing you today!  MEDICATIONS: -We are changing your medications today -Increase carvedilol  to 6.25 mg (1 tablet) twice daily. You may take 2 tablets of the carvedilol  3.125 mg strength twice daily until you pick up the new strength.   -I recommend you stop taking alka-seltzer for heartburn/indigestion. Recommend Tums and/or Pepcid  (famotidine ). You can find these over the counter. -Call if you have questions about your medications.   NEXT APPOINTMENT: Return to clinic in 1 month with Dr. Rolan.  In general, to take care of your heart failure: -Limit your fluid intake to 2 Liters (half-gallon) per day.   -Limit your salt intake to ideally 2-3 grams (2000-3000 mg) per day. -Weigh yourself daily and record, and bring that weight diary to your next appointment.  (Weight gain of 2-3 pounds in 1 day typically means fluid weight.) -The medications for your heart are to help your heart and help you live longer.   -Please contact us  before stopping any of your heart medications.  Call the clinic at [phone] with questions or to reschedule future appointments.

## 2023-11-21 NOTE — Progress Notes (Signed)
 Advanced Heart Failure Clinic Note   PCP: Cleotilde Perkins, DO PCP-Cardiologist: Vinie JAYSON Maxcy, MD  AHF MD: Dr. Rolan   HPI: [name] is a 73 y.o. 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    He was admitted 09/21/2023 with epigastric chest pain radiating to chest and neck. HS troponin elevated to 673. BP markedly elevated. Echo EF 35-40%, RV not well visualized. Cardiac cath with severe 3 v CAD. LVEDP 38-40 mmHg. He was diuresed with IV furosemide  and started on GDMT.  He underwent CABG X 4 (LIMA to LAD, SVG to OM1, sequential to OM3, SVG to PDA) by Dr. Kerrin on 09/27/2023. There was significant difficulty separating from cardiopulmonary bypass (took 3 attempts). Transferred to the ICU on inotrope/pressors. AHF team consulted to assist w/ post cardiotomy shock. Course was further c/b AKI 2/2 ATN. Scr rose to > 4 (b/l 1.3-1.6). He was started on CRRT for clearance and volume removal. Fortunately, he had renal recovery and able to wean off of CRRT. Also able to wean off pressors/inotropes. He was discharged home on 10/13/2023. Discharge weight 195 lbs. HF regimen at discharge was carvedilol  3.125 mg BID, hydralazine  50 mg TID, isosorbide  mononitrate 30 mg daily, and torsemide  60 mg daily.   On 10/24/2023, he presented for post hospital follow up with wife and paramedic, Izetta. He reported doing well. Weight was stable at 196 lbs. EKG showed NSR 99 bpm. His BP was soft, 90/50 but he denied orthostatic symptoms. Wife said home BP readings have been in the 130s. He reported good urine output with torsemide . Urine was yellow. Stable without CP.   Today he returns to HF clinic for pharmacist medication titration with his wife. Overall he says he is feeling pretty good. Denies dizziness and lightheadedness. He mentions that his energy level since hospitalization has been lower, but it has been consistent  (not worse than last visit). He reports chest pain that occurs after he eats which he attributes to indigestion and heart burn. He typically treats this with Alka-Seltzer, which he says helps. EKG performed by Katie (HF paramedic) on 11/06/2023, but no change since previous EKG. Of note, he did have atypical CP before his last MI. Otherwise denies chest pain, or palpitations. He said his breathing is sometimes good, sometimes bad. He reports days where he has no breathing difficulties while moving around / walking for up to 10 minutes, but other days where he becomes SOB at rest, or after walking short distances in his house. He does mention that he is able to perform most ADL's without difficulty. His weight at home has been stable ~190-194 lbs. Denies LEE or other swelling, none present today. Denies PND / orthopnea. He often falls asleep in recliner, but when in bed sleeps with 1 pillow behind his head. His appetite is getting better, but some days he does not feel hungry at all. He is trying to adhere to a low salt diet, and uses Mrs. Dash seasoning. His wife fills his pill box and is very involved with his care. She helps him follow a low salt diet.   HF Medications: Carvedilol  3.125 mg BID Hydralazine  50 mg TID  Isosorbide  mononitrate 30 mg daily  Torsemide  60 mg daily  Has the patient been experiencing any side effects to the medications prescribed?  Notes darker stools since starting iron  supplement, which is known side effect.  Does the patient have any problems obtaining medications due to transportation or finances?   No difficulty with HF regimen, patient has [employer]. His wife mentioned they spoke with other provider about switching from Trigel F-Forte to an OTC equivalent due to cost.   Understanding of regimen: poor Understanding of indications: poor Potential of compliance: fair Patient understands to avoid NSAIDs. Patient understands to avoid decongestants.    Pertinent  Lab Values: 11/03/2023: Serum creatinine 2.41, BUN 39, Potassium 4.7, Sodium 140, BNP 78.8 (10/24/2023) 11/21/23 (Drawn at [facility]): Serum creatinine 2.11, BUN 29, Potassium 4.6, Sodium 143, BNP 78.8   Vital Signs: Weight: 195.4 lbs (last clinic weight: 196.8 lbs) Blood pressure: 154/84 mmHg Heart rate: 105 bpm  Assessment/Plan: 1. Chronic Systolic Heart Failure  Ischemic cardiomyopathy. Echo pre-CABG with EF 35-40%, RV hard to visualize. Difficulty separating from cardiopulmonary bypass following CABG 09/27/2023. Required inotropic support for post-cardiotomy shock. Repeat Echo 09/30/2023 EF 25-30%, RV not well visualized. Recovered and weaned off pressors.  - NYHA Class III. Appears euvolemic on exam.  - Continue Torsemide  60 mg daily. - Increase carvedilol  to 6.25 mg BID. May also help with CP if any is cardiac related. - Continue hydralazine  50 mg TID  - Continue isosorbide  mononitrate 30 mg daily  - GDMT limited by recent AKI  - Stop alka-seltzer. Time was spent educating him about alternative medications for treatment of heartburn, like Tums or famotidine , which do not contain aspirin  / NSAIDs. He and wife verbalized understanding. Can try PPI in future if needed.    2. CAD: NSTEMI. Hx PCI to LAD in 2005. NSTEMI 09/21/2023 with multivessel disease on LHC. S/p CABG X 4 (LIMA to LAD, SVG to OM1, sequential to OM3, SVG to PDA) on 09/27/2023 - Continue aspirin  81 mg daily + Plavix  75 mg daily - Continue atorvastatin  80 mg daily   3. CKD IIIb:  - (b/l Scr 1.3-1.6) - Developed AKI 2/2 ATN while hospitalized for CABG/ Cardiogenic shock. SCr rose > 4. He required short term CRRT for clerance and volume removal. Fortunately w/ renal recovery. SCr down to 2.5 by day of discharge - Scr 2.11 (GFR 32) on labs drawn from [facility] yesterday. Albumin /Scr ratio elevated at 994  - Managed per [facility]   4. DM II:  - Uncontrolled, A1c 11.3% - Insulin  - Management per PCP  - No SGLT2i  w/ Hgb A1c > 10 and recent AKI   Follow up with MD in 1 month. ECHO at this time.    Tinnie Redman, PharmD, BCPS, BCCP, CPP Heart Failure Clinic Pharmacist [phone]

## 2023-11-22 DIAGNOSIS — I214 Non-ST elevation (NSTEMI) myocardial infarction: Secondary | ICD-10-CM | POA: Diagnosis not present

## 2023-11-22 DIAGNOSIS — I130 Hypertensive heart and chronic kidney disease with heart failure and stage 1 through stage 4 chronic kidney disease, or unspecified chronic kidney disease: Secondary | ICD-10-CM | POA: Diagnosis not present

## 2023-11-22 DIAGNOSIS — Z48812 Encounter for surgical aftercare following surgery on the circulatory system: Secondary | ICD-10-CM | POA: Diagnosis not present

## 2023-11-22 DIAGNOSIS — N179 Acute kidney failure, unspecified: Secondary | ICD-10-CM | POA: Diagnosis not present

## 2023-11-22 DIAGNOSIS — E1122 Type 2 diabetes mellitus with diabetic chronic kidney disease: Secondary | ICD-10-CM | POA: Diagnosis not present

## 2023-11-22 DIAGNOSIS — N183 Chronic kidney disease, stage 3 unspecified: Secondary | ICD-10-CM | POA: Diagnosis not present

## 2023-11-22 DIAGNOSIS — I2510 Atherosclerotic heart disease of native coronary artery without angina pectoris: Secondary | ICD-10-CM | POA: Diagnosis not present

## 2023-11-22 DIAGNOSIS — I5023 Acute on chronic systolic (congestive) heart failure: Secondary | ICD-10-CM | POA: Diagnosis not present

## 2023-11-22 DIAGNOSIS — I255 Ischemic cardiomyopathy: Secondary | ICD-10-CM | POA: Diagnosis not present

## 2023-11-28 ENCOUNTER — Telehealth (HOSPITAL_COMMUNITY): Payer: Self-pay

## 2023-11-28 NOTE — Telephone Encounter (Signed)
 Attempted f/u call regarding cardiac rehab- no answer, left message informing patient we will be closing his referral and to call us  back if he would like it reopened or has any questions. Sent MyChart message.  Closing referral.

## 2023-11-30 ENCOUNTER — Encounter: Payer: Self-pay | Admitting: Student

## 2023-11-30 DIAGNOSIS — E1165 Type 2 diabetes mellitus with hyperglycemia: Secondary | ICD-10-CM

## 2023-12-01 ENCOUNTER — Encounter (HOSPITAL_COMMUNITY): Payer: Self-pay

## 2023-12-01 DIAGNOSIS — I255 Ischemic cardiomyopathy: Secondary | ICD-10-CM | POA: Diagnosis not present

## 2023-12-01 DIAGNOSIS — I130 Hypertensive heart and chronic kidney disease with heart failure and stage 1 through stage 4 chronic kidney disease, or unspecified chronic kidney disease: Secondary | ICD-10-CM | POA: Diagnosis not present

## 2023-12-01 DIAGNOSIS — N179 Acute kidney failure, unspecified: Secondary | ICD-10-CM | POA: Diagnosis not present

## 2023-12-01 DIAGNOSIS — E1122 Type 2 diabetes mellitus with diabetic chronic kidney disease: Secondary | ICD-10-CM | POA: Diagnosis not present

## 2023-12-01 DIAGNOSIS — I2510 Atherosclerotic heart disease of native coronary artery without angina pectoris: Secondary | ICD-10-CM | POA: Diagnosis not present

## 2023-12-01 DIAGNOSIS — I214 Non-ST elevation (NSTEMI) myocardial infarction: Secondary | ICD-10-CM | POA: Diagnosis not present

## 2023-12-01 DIAGNOSIS — Z48812 Encounter for surgical aftercare following surgery on the circulatory system: Secondary | ICD-10-CM | POA: Diagnosis not present

## 2023-12-01 DIAGNOSIS — I5023 Acute on chronic systolic (congestive) heart failure: Secondary | ICD-10-CM | POA: Diagnosis not present

## 2023-12-01 DIAGNOSIS — N183 Chronic kidney disease, stage 3 unspecified: Secondary | ICD-10-CM | POA: Diagnosis not present

## 2023-12-01 MED ORDER — FREESTYLE LIBRE 3 PLUS SENSOR MISC: 3 refills | Status: AC

## 2023-12-04 ENCOUNTER — Telehealth: Payer: Self-pay | Admitting: Pharmacist

## 2023-12-04 ENCOUNTER — Other Ambulatory Visit (HOSPITAL_COMMUNITY): Payer: Self-pay

## 2023-12-04 NOTE — Telephone Encounter (Signed)
 Patient contacted for follow-up of diabetes management.  Since last contact patient reports feeling excellent. Reports eating more this week than last. Glucose under better control with GMI 7.6%.  Current Medications include:  -metformin  XL 500 mg BID with meals -Basaglar  (insulin  glargine) 18 units daily  Patient denies any significant medication related side effects.  Medication Plan: no changes at this time.  Total time with patient call and documentation of interaction: 7 minutes.  Follow-up office visit scheduled for 12/19/23 at 2:30 pm with Dr. Cleotilde

## 2023-12-04 NOTE — Progress Notes (Signed)
 Paramedicine Encounter    Patient ID: [name], male    DOB: 13-Jun-1950, 73 y.o.   MRN: [id number]   Complaints-none   Edema-slight edema to his legs  Compliance with meds-yes  Pill box filled-wife fills it  If so, by whom-wife   Refills needed-n/a  Pt was seen by pharmacy 2 wks ago, his carvedilol  was increased to 6.25mg  BID. He reports been feeling good. He denies c/p, no dizziness, energy level ok. He reports breathing doing ok. He is ready to get back to mow grass on riding mower.  He does not want to proceed with cardiac rehab.   Still skips some meds at times.  They have it divided out into 4X a day meds, he is forgetting the bedtime dose more, so we divided it out into TID sections and see how this goes and how he feels.  Weights stable- between 194-197 His home b/p- ranging around 120/s/70-80s and HR from high 80s to 104.  Dr amalia  called while I was here, they have been monitoring his CBG's and they have reported his appetite has been increased and if any changes are needed they will reach out to him and let him know.  He also is getting him set up with follow up with PCP.  B/p have been good.  He took his meds today around 12pm.  Meds verified. Wife fills pill box.  He does have slight swelling to his lower legs, he has been going out to eat a few times for his anniversary so his sodium intake has been elevated. He also admitted to using a tad bit of salt added to his food. His weight is 197-he was 195 2 wks ago. Advised him and wife to let me know if his weight continues to rise b/c he can take additional torsemide .  She will text me this week with his weights.    CBG's- 212  average 210-   BP 138/78   Pulse 94   Resp 16   Wt 197 lb (89.4 kg)   SpO2 98%   BMI 33.81 kg/m  Weight yesterday-196  Last visit weight-195   Patient Care Team: Cleotilde Perkins, DO as PCP - General (Family Medicine) Mona Vinie BROCKS, MD as PCP - Cardiology (Cardiology) Weyman Corning, RN  (Inactive) as [facility] Care Management Heisler, Eleanor, OD as Consulting Physician (Optometry)  Patient Active Problem List   Diagnosis Date Noted   Physical deconditioning 10/05/2023   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   Acute on chronic combined systolic and diastolic heart failure (HCC) 09/25/2023   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   Type 2 diabetes mellitus with hyperglycemia, with long-term current use of insulin  (HCC) 09/23/2023   Type 2 diabetes mellitus with hyperlipidemia (HCC) 09/23/2023   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) 09/22/2023   NSTEMI (non-ST elevated myocardial infarction) (HCC) 09/21/2023   Type 2 diabetes mellitus with diabetic chronic kidney disease (HCC) 09/21/2023   CKD (chronic kidney disease) 09/21/2023   Coronary artery disease involving native coronary artery of native heart without angina pectoris    Hypercholesterolemia    Low-level of literacy 05/21/2018   Globus sensation 03/12/2018   History of coronary artery stent placement 06/09/2017   Essential hypertension, benign 06/09/2017    Current Outpatient Medications:    Accu-Chek Softclix Lancets lancets, USE AS DIRECTED UP TO FOUR TIMES DAILY, Disp: 100 each, Rfl: 12   aspirin  EC 81 MG tablet, Take 81 mg by mouth daily., Disp: ,  Rfl:    atorvastatin  (LIPITOR ) 80 MG tablet, Take 1 tablet (80 mg total) by mouth daily., Disp: 60 tablet, Rfl: 1   BD PEN NEEDLE NANO 2ND GEN 32G X 4 MM MISC, USE 3 TIMES DAILY AS NEEDED BETWEEN MEALS AND BEDTIME, Disp: 100 each, Rfl: 2   blood glucose meter kit and supplies, Dispense based on patient and insurance preference. Use up to four times daily as directed. (FOR ICD-10 E10.9, E11.9)., Disp: 1 each, Rfl: 0   Blood Glucose Monitoring Suppl (ACCU-CHEK GUIDE ME) w/Device KIT, Please use to check blood sugar level up to four times daily., Disp: 1 kit, Rfl: 0   carvedilol  (COREG ) 6.25 MG tablet, Take 1 tablet (6.25 mg total) by mouth 2  (two) times daily with a meal., Disp: 60 tablet, Rfl: 5   clopidogrel  (PLAVIX ) 75 MG tablet, Take 1 tablet (75 mg total) by mouth daily., Disp: 60 tablet, Rfl: 1   Continuous Glucose Sensor (FREESTYLE LIBRE 3 PLUS SENSOR) MISC, Change sensor every 15 days., Disp: 1 each, Rfl: 3   Continuous Glucose Sensor (FREESTYLE LIBRE 3 SENSOR) MISC, 2 Devices by Does not apply route as needed. Place 1 sensor on the skin every 14 days. Use to check glucose continuously, Disp: 2 each, Rfl: 11   Fe Fum-Vit C-Vit B12-FA (TRIGELS-F FORTE) CAPS capsule, Take 1 capsule by mouth daily after breakfast., Disp: 90 capsule, Rfl: 3   glucose blood (ACCU-CHEK GUIDE TEST) test strip, Please use to check blood sugar up to 4 times daily., Disp: 100 each, Rfl: 12   hydrALAZINE  (APRESOLINE ) 50 MG tablet, Take 1 tablet (50 mg total) by mouth 3 (three) times daily., Disp: 90 tablet, Rfl: 1   Insulin  Glargine (BASAGLAR  KWIKPEN) 100 UNIT/ML, Inject 18 Units into the skin daily., Disp: , Rfl:    isosorbide  mononitrate (IMDUR ) 30 MG 24 hr tablet, Take 1 tablet (30 mg total) by mouth daily., Disp: 30 tablet, Rfl: 1   metFORMIN  (GLUCOPHAGE -XR) 500 MG 24 hr tablet, Take 1 tablet (500 mg total) by mouth 2 (two) times daily with a meal., Disp: 180 tablet, Rfl: 3   potassium chloride  SA (KLOR-CON  M) 20 MEQ tablet, Take 1 tablet (20 mEq total) by mouth daily., Disp: 30 tablet, Rfl: 3   torsemide  (DEMADEX ) 20 MG tablet, Take 3 tablets (60 mg total) by mouth daily., Disp: 90 tablet, Rfl: 1   traMADol  (ULTRAM ) 50 MG tablet, Take 1 tablet (50 mg total) by mouth every 6 (six) hours as needed for moderate pain (pain score 4-6)., Disp: 28 tablet, Rfl: 0 Allergies  Allergen Reactions   Trulicity [Dulaglutide] Other (See Comments)    Patient indicated this caused pancreatitis.       Social History   Socioeconomic History   Marital status: Married    Spouse name: [name]   Number of children: 4   Years of education: 10   Highest education  level: 10th grade  Occupational History   Occupation: retired  Tobacco Use   Smoking status: Former    Current packs/day: 0.00    Average packs/day: 0.5 packs/day for 10.0 years (5.0 ttl pk-yrs)    Types: Cigarettes    Start date: 04/26/1983    Quit date: 04/25/1993    Years since quitting: 30.6   Smokeless tobacco: Never  Vaping Use   Vaping status: Never Used  Substance and Sexual Activity   Alcohol use: No   Drug use: No   Sexual activity: Yes  Other Topics Concern  Not on file  Social History Narrative   Patient lives with his wife in [place].   Patent has 4 boys, one lives in [place] .    Patient enjoys fishing, camping in the woods, working outside, anything outdoor related.   Social Drivers of Health   Financial Resource Strain: Medium Risk (10/31/2023)   Overall Financial Resource Strain (CARDIA)    Difficulty of Paying Living Expenses: Somewhat hard  Food Insecurity: Food Insecurity Present (10/31/2023)   Hunger Vital Sign    Worried About Running Out of Food in the Last Year: Sometimes true    Ran Out of Food in the Last Year: Sometimes true  Transportation Needs: No Transportation Needs (10/31/2023)   PRAPARE - Administrator, Civil Service (Medical): No    Lack of Transportation (Non-Medical): No  Physical Activity: Inactive (10/31/2023)   Exercise Vital Sign    Days of Exercise per Week: 0 days    Minutes of Exercise per Session: Not on file  Stress: No Stress Concern Present (10/31/2023)   [employer] of Occupational Health - Occupational Stress Questionnaire    Feeling of Stress: Only a little  Social Connections: Moderately Integrated (10/31/2023)   Social Connection and Isolation Panel    Frequency of Communication with Friends and Family: More than three times a week    Frequency of Social Gatherings with Friends and Family: Patient declined    Attends Religious Services: 1 to 4 times per year    Active Member of [employer] or Organizations: No     Attends Banker Meetings: Not on file    Marital Status: Married  Intimate Partner Violence: Not At Risk (10/17/2023)   Humiliation, Afraid, Rape, and Kick questionnaire    Fear of Current or Ex-Partner: No    Emotionally Abused: No    Physically Abused: No    Sexually Abused: No    Physical Exam      Future Appointments  Date Time Provider Department Center  12/19/2023  2:30 PM Cleotilde Perkins, DO [facility]  12/26/2023  2:00 PM [facility] ECHO OP 1 [facility]  12/26/2023  3:00 PM Rolan Ezra RAMAN, MD [facility] None  08/26/2024  2:20 PM [facility] ANNUAL WELLNESS VISIT [facility]       Izetta Quivers, Paramedic [phone] [facility] Paramedic  12/04/23

## 2023-12-05 ENCOUNTER — Telehealth (HOSPITAL_COMMUNITY): Payer: Self-pay

## 2023-12-05 NOTE — Telephone Encounter (Signed)
 Reviewed and agree with Dr Rennis plan.

## 2023-12-06 NOTE — Telephone Encounter (Signed)
 Pts wife reached out to me to let me know his weight was 195lbs so he's down 2lbs from day before.  He's feeling good.   Will f/u next week.   Izetta Quivers, EMT-Paramedic  [phone] 12/06/2023

## 2023-12-07 ENCOUNTER — Other Ambulatory Visit: Payer: Self-pay | Admitting: Physician Assistant

## 2023-12-07 ENCOUNTER — Telehealth (HOSPITAL_COMMUNITY): Payer: Self-pay

## 2023-12-07 ENCOUNTER — Other Ambulatory Visit (HOSPITAL_COMMUNITY): Payer: Self-pay

## 2023-12-07 MED ORDER — HYDRALAZINE HCL 50 MG PO TABS: 50.0000 mg | ORAL_TABLET | Freq: Three times a day (TID) | ORAL | 1 refills | Status: DC

## 2023-12-07 MED ORDER — TORSEMIDE 20 MG PO TABS: 60.0000 mg | ORAL_TABLET | Freq: Every day | ORAL | 1 refills | Status: DC

## 2023-12-07 NOTE — Telephone Encounter (Signed)
 Pts wife reached out to me on his behalf regarding his refills.  Pharm told them the doc needed to reach out to  pharm ref filling hydralazine  and torsemide . I had sent message to triage to get those sent in.  Not sure if hospitalist sent those when he was d/c, but clinic is able to fill it.   Wife reports dissatisfaction with [facility], they are filing things that are not needed, and not filling what is needed and filling the same medicine with 2 different dosages. So they are very unhappy with the service they are getting and she reports this is happening very often. I asked her if she wanted to move pharmacies, she is going to discuss with Orlie and then get back to me.  She did reach back out and said he is agreeable. So I explained to her I would start that process and handle this for them.   Due to pharm fixing to close I will reach out Monday morning.   Izetta Quivers, EMT-Paramedic  [phone] 12/07/2023

## 2023-12-08 DIAGNOSIS — E1122 Type 2 diabetes mellitus with diabetic chronic kidney disease: Secondary | ICD-10-CM | POA: Diagnosis not present

## 2023-12-08 DIAGNOSIS — I255 Ischemic cardiomyopathy: Secondary | ICD-10-CM | POA: Diagnosis not present

## 2023-12-08 DIAGNOSIS — Z48812 Encounter for surgical aftercare following surgery on the circulatory system: Secondary | ICD-10-CM | POA: Diagnosis not present

## 2023-12-08 DIAGNOSIS — I130 Hypertensive heart and chronic kidney disease with heart failure and stage 1 through stage 4 chronic kidney disease, or unspecified chronic kidney disease: Secondary | ICD-10-CM | POA: Diagnosis not present

## 2023-12-08 DIAGNOSIS — I2510 Atherosclerotic heart disease of native coronary artery without angina pectoris: Secondary | ICD-10-CM | POA: Diagnosis not present

## 2023-12-08 DIAGNOSIS — N179 Acute kidney failure, unspecified: Secondary | ICD-10-CM | POA: Diagnosis not present

## 2023-12-08 DIAGNOSIS — I5023 Acute on chronic systolic (congestive) heart failure: Secondary | ICD-10-CM | POA: Diagnosis not present

## 2023-12-08 DIAGNOSIS — N183 Chronic kidney disease, stage 3 unspecified: Secondary | ICD-10-CM | POA: Diagnosis not present

## 2023-12-08 DIAGNOSIS — I214 Non-ST elevation (NSTEMI) myocardial infarction: Secondary | ICD-10-CM | POA: Diagnosis not present

## 2023-12-11 ENCOUNTER — Other Ambulatory Visit (HOSPITAL_COMMUNITY): Payer: Self-pay

## 2023-12-11 ENCOUNTER — Telehealth (HOSPITAL_COMMUNITY): Payer: Self-pay

## 2023-12-11 NOTE — Progress Notes (Addendum)
 Paramedicine Encounter    Patient ID: [name], male    DOB: [birth year], 73 y.o.   MRN: [id number]   Complaints-tingling in his chest last night   Edema-none   Compliance with meds-yes   Pill box filled-yes  If so, by whom-wife  Refills needed-none at this time    Pt reports last night he experienced tingling in his left upper chest area. He is pain free at the time. He did report a h/a at that time too.  He denied any sob, no n/v, no diaphoresis, during this episode. He reports it lasted about an hour, he did not take any meds for it, discomfort went away on his own.  Appetite ok.  He reports poor taste for anything since he left the hosp.  Urine color ok. No bleeding issues.  Weight stable.  12 lead done due to his chest discomfort again-no changes.  Advised him to call 911 immediately if this happens again.  Will report this to clinic.  He goes back to clinic on 9/2 with echo.     CBG's- 178 this morning  BP 132/74   Pulse 94   Resp 16   Wt 191 lb (86.6 kg)   SpO2 98%   BMI 32.79 kg/m  Weight yesterday-191 Last visit weight-197  Patient Care Team: Cleotilde Perkins, DO as PCP - General (Family Medicine) Mona Vinie BROCKS, MD as PCP - Cardiology (Cardiology) Weyman Corning, RN (Inactive) as [facility] Care Management [place], Eleanor, OD as Consulting Physician (Optometry)  Patient Active Problem List   Diagnosis Date Noted   Physical deconditioning 10/05/2023   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   Acute on chronic combined systolic and diastolic heart failure (HCC) 09/25/2023   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   Type 2 diabetes mellitus with hyperglycemia, with long-term current use of insulin  (HCC) 09/23/2023   Type 2 diabetes mellitus with hyperlipidemia (HCC) 09/23/2023   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) 09/22/2023   NSTEMI (non-ST elevated myocardial infarction) (HCC) 09/21/2023   Type 2 diabetes mellitus with diabetic chronic  kidney disease (HCC) 09/21/2023   CKD (chronic kidney disease) 09/21/2023   Coronary artery disease involving native coronary artery of native heart without angina pectoris    Hypercholesterolemia    Low-level of literacy 05/21/2018   Globus sensation 03/12/2018   History of coronary artery stent placement 06/09/2017   Essential hypertension, benign 06/09/2017    Current Outpatient Medications:    Accu-Chek Softclix Lancets lancets, USE AS DIRECTED UP TO FOUR TIMES DAILY, Disp: 100 each, Rfl: 12   aspirin  EC 81 MG tablet, Take 81 mg by mouth daily., Disp: , Rfl:    atorvastatin  (LIPITOR ) 80 MG tablet, Take 1 tablet (80 mg total) by mouth daily., Disp: 60 tablet, Rfl: 1   BD PEN NEEDLE NANO 2ND GEN 32G X 4 MM MISC, USE 3 TIMES DAILY AS NEEDED BETWEEN MEALS AND BEDTIME, Disp: 100 each, Rfl: 2   blood glucose meter kit and supplies, Dispense based on patient and insurance preference. Use up to four times daily as directed. (FOR ICD-10 E10.9, E11.9)., Disp: 1 each, Rfl: 0   Blood Glucose Monitoring Suppl (ACCU-CHEK GUIDE ME) w/Device KIT, Please use to check blood sugar level up to four times daily., Disp: 1 kit, Rfl: 0   carvedilol  (COREG ) 6.25 MG tablet, Take 1 tablet (6.25 mg total) by mouth 2 (two) times daily with a meal., Disp: 60 tablet, Rfl: 5   clopidogrel  (PLAVIX ) 75 MG  tablet, Take 1 tablet (75 mg total) by mouth daily., Disp: 60 tablet, Rfl: 1   Continuous Glucose Sensor (FREESTYLE LIBRE 3 PLUS SENSOR) MISC, Change sensor every 15 days., Disp: 1 each, Rfl: 3   Continuous Glucose Sensor (FREESTYLE LIBRE 3 SENSOR) MISC, 2 Devices by Does not apply route as needed. Place 1 sensor on the skin every 14 days. Use to check glucose continuously, Disp: 2 each, Rfl: 11   Fe Fum-Vit C-Vit B12-FA (TRIGELS-F FORTE) CAPS capsule, Take 1 capsule by mouth daily after breakfast., Disp: 90 capsule, Rfl: 3   glucose blood (ACCU-CHEK GUIDE TEST) test strip, Please use to check blood sugar up to 4 times  daily., Disp: 100 each, Rfl: 12   hydrALAZINE  (APRESOLINE ) 50 MG tablet, Take 1 tablet (50 mg total) by mouth 3 (three) times daily., Disp: 90 tablet, Rfl: 1   Insulin  Glargine (BASAGLAR  KWIKPEN) 100 UNIT/ML, Inject 18 Units into the skin daily., Disp: , Rfl:    isosorbide  mononitrate (IMDUR ) 30 MG 24 hr tablet, Take 1 tablet (30 mg total) by mouth daily., Disp: 30 tablet, Rfl: 1   metFORMIN  (GLUCOPHAGE -XR) 500 MG 24 hr tablet, Take 1 tablet (500 mg total) by mouth 2 (two) times daily with a meal., Disp: 180 tablet, Rfl: 3   potassium chloride  SA (KLOR-CON  M) 20 MEQ tablet, Take 1 tablet (20 mEq total) by mouth daily., Disp: 30 tablet, Rfl: 3   torsemide  (DEMADEX ) 20 MG tablet, Take 3 tablets (60 mg total) by mouth daily., Disp: 90 tablet, Rfl: 1   traMADol  (ULTRAM ) 50 MG tablet, Take 1 tablet (50 mg total) by mouth every 6 (six) hours as needed for moderate pain (pain score 4-6)., Disp: 28 tablet, Rfl: 0 Allergies  Allergen Reactions   Trulicity [Dulaglutide] Other (See Comments)    Patient indicated this caused pancreatitis.       Social History   Socioeconomic History   Marital status: Married    Spouse name: [name]   Number of children: 4   Years of education: 10   Highest education level: 10th grade  Occupational History   Occupation: retired  Tobacco Use   Smoking status: Former    Current packs/day: 0.00    Average packs/day: 0.5 packs/day for 10.0 years (5.0 ttl pk-yrs)    Types: Cigarettes    Start date: 04/26/1983    Quit date: 04/25/1993    Years since quitting: 30.6   Smokeless tobacco: Never  Vaping Use   Vaping status: Never Used  Substance and Sexual Activity   Alcohol use: No   Drug use: No   Sexual activity: Yes  Other Topics Concern   Not on file  Social History Narrative   Patient lives with his wife in [place].   Patent has 4 boys, one lives in [place] .    Patient enjoys fishing, camping in the woods, working outside, anything outdoor related.    Social Drivers of Health   Financial Resource Strain: Medium Risk (10/31/2023)   Overall Financial Resource Strain (CARDIA)    Difficulty of Paying Living Expenses: Somewhat hard  Food Insecurity: Food Insecurity Present (10/31/2023)   Hunger Vital Sign    Worried About Running Out of Food in the Last Year: Sometimes true    Ran Out of Food in the Last Year: Sometimes true  Transportation Needs: No Transportation Needs (10/31/2023)   PRAPARE - Administrator, Civil Service (Medical): No    Lack of Transportation (Non-Medical): No  Physical Activity: Inactive (  10/31/2023)   Exercise Vital Sign    Days of Exercise per Week: 0 days    Minutes of Exercise per Session: Not on file  Stress: No Stress Concern Present (10/31/2023)   [employer] of Occupational Health - Occupational Stress Questionnaire    Feeling of Stress: Only a little  Social Connections: Moderately Integrated (10/31/2023)   Social Connection and Isolation Panel    Frequency of Communication with Friends and Family: More than three times a week    Frequency of Social Gatherings with Friends and Family: Patient declined    Attends Religious Services: 1 to 4 times per year    Active Member of [employer] or Organizations: No    Attends Banker Meetings: Not on file    Marital Status: Married  Intimate Partner Violence: Not At Risk (10/17/2023)   Humiliation, Afraid, Rape, and Kick questionnaire    Fear of Current or Ex-Partner: No    Emotionally Abused: No    Physically Abused: No    Sexually Abused: No    Physical Exam      Future Appointments  Date Time Provider Department Center  12/19/2023  2:30 PM Cleotilde Perkins, DO [facility]  12/26/2023  2:00 PM MC ECHO OP 1 [facility]  12/26/2023  3:00 PM Rolan Ezra RAMAN, MD [facility] None  08/26/2024  2:20 PM [facility] ANNUAL WELLNESS VISIT [facility]       Izetta Quivers, Paramedic [phone] [facility] Paramedic  12/12/23

## 2023-12-11 NOTE — Telephone Encounter (Signed)
 Per patient request, he wanted me to facilitate the [facility] due to many issues they were having with [facility].  [facility] and started that process.  Pharmacy will call me back and we will review med list to verify all his meds are accurate and will correct if anything is wrong.   Izetta Quivers, EMT-Paramedic  [phone] 12/11/2023

## 2023-12-13 ENCOUNTER — Other Ambulatory Visit (HOSPITAL_COMMUNITY): Payer: Self-pay

## 2023-12-13 MED ORDER — CLOPIDOGREL BISULFATE 75 MG PO TABS: 75.0000 mg | ORAL_TABLET | Freq: Every day | ORAL | 1 refills | Status: DC

## 2023-12-13 MED ORDER — ISOSORBIDE MONONITRATE ER 30 MG PO TB24: 30.0000 mg | ORAL_TABLET | Freq: Every day | ORAL | 1 refills | Status: DC

## 2023-12-13 MED ORDER — ATORVASTATIN CALCIUM 80 MG PO TABS: 80.0000 mg | ORAL_TABLET | Freq: Every day | ORAL | 0 refills | Status: DC

## 2023-12-13 MED ORDER — ATORVASTATIN CALCIUM 80 MG PO TABS
80.0000 mg | ORAL_TABLET | Freq: Every day | ORAL | 0 refills | Status: DC
Start: 2023-12-13 — End: 2023-12-19

## 2023-12-19 ENCOUNTER — Ambulatory Visit (INDEPENDENT_AMBULATORY_CARE_PROVIDER_SITE_OTHER): Admitting: Student

## 2023-12-19 ENCOUNTER — Other Ambulatory Visit (HOSPITAL_COMMUNITY): Payer: Self-pay

## 2023-12-19 ENCOUNTER — Encounter: Payer: Self-pay | Admitting: Student

## 2023-12-19 VITALS — BP 136/76 | HR 78 | Wt 195.0 lb

## 2023-12-19 DIAGNOSIS — I10 Essential (primary) hypertension: Secondary | ICD-10-CM | POA: Diagnosis not present

## 2023-12-19 DIAGNOSIS — E785 Hyperlipidemia, unspecified: Secondary | ICD-10-CM | POA: Diagnosis not present

## 2023-12-19 DIAGNOSIS — E1169 Type 2 diabetes mellitus with other specified complication: Secondary | ICD-10-CM | POA: Diagnosis not present

## 2023-12-19 DIAGNOSIS — E119 Type 2 diabetes mellitus without complications: Secondary | ICD-10-CM

## 2023-12-19 DIAGNOSIS — I2510 Atherosclerotic heart disease of native coronary artery without angina pectoris: Secondary | ICD-10-CM | POA: Diagnosis not present

## 2023-12-19 DIAGNOSIS — Z794 Long term (current) use of insulin: Secondary | ICD-10-CM | POA: Diagnosis not present

## 2023-12-19 DIAGNOSIS — E1165 Type 2 diabetes mellitus with hyperglycemia: Secondary | ICD-10-CM | POA: Diagnosis not present

## 2023-12-19 LAB — POCT GLYCOSYLATED HEMOGLOBIN (HGB A1C): HbA1c, POC (controlled diabetic range): 7.8 % — AB (ref 0.0–7.0)

## 2023-12-19 MED ORDER — CARVEDILOL 6.25 MG PO TABS
6.2500 mg | ORAL_TABLET | Freq: Two times a day (BID) | ORAL | 5 refills | Status: DC
  Filled 2023-12-19: qty 60, 30d supply, fill #0

## 2023-12-19 MED ORDER — TORSEMIDE 20 MG PO TABS
60.0000 mg | ORAL_TABLET | Freq: Every day | ORAL | 1 refills | Status: DC
  Filled 2023-12-19: qty 90, 30d supply, fill #0

## 2023-12-19 MED ORDER — HYDRALAZINE HCL 50 MG PO TABS
50.0000 mg | ORAL_TABLET | Freq: Three times a day (TID) | ORAL | 1 refills | Status: DC
  Filled 2023-12-19: qty 90, 30d supply, fill #0

## 2023-12-19 MED ORDER — CLOPIDOGREL BISULFATE 75 MG PO TABS
75.0000 mg | ORAL_TABLET | Freq: Every day | ORAL | 1 refills | Status: DC
  Filled 2023-12-19: qty 90, 90d supply, fill #0

## 2023-12-19 MED ORDER — ISOSORBIDE MONONITRATE ER 30 MG PO TB24
30.0000 mg | ORAL_TABLET | Freq: Every day | ORAL | 1 refills | Status: DC
  Filled 2023-12-19: qty 90, 90d supply, fill #0

## 2023-12-19 MED ORDER — ATORVASTATIN CALCIUM 80 MG PO TABS
80.0000 mg | ORAL_TABLET | Freq: Every day | ORAL | 0 refills | Status: DC
  Filled 2023-12-19: qty 90, 90d supply, fill #0

## 2023-12-19 MED ORDER — FREESTYLE LIBRE 3 SENSOR MISC
2.0000 | 11 refills | Status: DC | PRN
Start: 2023-12-19 — End: 2023-12-26
  Filled 2023-12-19: qty 2, 28d supply, fill #0

## 2023-12-19 MED ORDER — POTASSIUM CHLORIDE CRYS ER 20 MEQ PO TBCR
20.0000 meq | EXTENDED_RELEASE_TABLET | Freq: Every day | ORAL | 3 refills | Status: DC
  Filled 2023-12-19: qty 30, 30d supply, fill #0

## 2023-12-19 MED ORDER — METFORMIN HCL ER 500 MG PO TB24
500.0000 mg | ORAL_TABLET | Freq: Two times a day (BID) | ORAL | 3 refills | Status: DC
  Filled 2023-12-19: qty 180, 90d supply, fill #0

## 2023-12-19 NOTE — Patient Instructions (Addendum)
 It was great to see you today!   I am sending your medications to the [facility]. This will be delivered to your house   Future Appointments  Date Time Provider Department Center  12/26/2023  2:00 PM [facility] ECHO OP 1 [facility]  12/26/2023  3:00 PM Rolan Ezra RAMAN, MD [facility] None  08/26/2024  2:20 PM [facility] ANNUAL WELLNESS VISIT [facility]    Please arrive 15 minutes before your appointment to ensure smooth check in process.    Please call the clinic at [phone] if your symptoms worsen or you have any concerns.  Thank you for allowing me to participate in your care, Dr. Damien Pinal [facility]    For your pain you can try several over the counter topical medications. Topical medications are a great option to treat pain as you can place the medication right where you are hurting and they have less side effects.   Voltaren Gel/Diclofenac Gel: this is an anti-inflammatory cream (same ingredient as Motrin/Ibuprofen/Aleve ) can be applied up to 4 times per day.     Lidocaine  patch: can be applied for 12 hours and after needs a 12 hour break to continue to be effective. Works by using numbing medication to help with pain relief. Can be found at any pharmacy over the counter or on [email or web address]    Capsaicin cream: made from the same ingredient that makes hot peppers spicy, this works by numbing up the area of pain. Please wash your hands and do not touch your eyes after applying the cream as active ingredient can hurt your eyes.

## 2023-12-19 NOTE — Assessment & Plan Note (Addendum)
>>  ASSESSMENT AND PLAN FOR TYPE 2 DIABETES MELLITUS WITH HYPERGLYCEMIA, WITH LONG-TERM CURRENT USE OF INSULIN  (HCC) WRITTEN ON 12/19/2023  2:27 PM BY Elonzo Sopp, DO  Due for A1c, BMP, urine microalbumin  On review of Dexcom sensor he is within goal with reported A1c of 7.4 per sensor reading  Continue basaglar  18 units daily and metformin  500 mg daily  Can consider SGLT2i in future with concurrent CKD    >>ASSESSMENT AND PLAN FOR T2DM (TYPE 2 DIABETES MELLITUS) (HCC) WRITTEN ON 12/19/2023  2:27 PM BY Beata Beason, DO  Recheck lipid panel

## 2023-12-19 NOTE — Progress Notes (Signed)
    SUBJECTIVE:   CHIEF COMPLAINT / HPI:   [name] is a 73 y.o. male presenting for T2DM follow up.   T2DM:  Last A1c 11.3 2 months ago  Medications: Basaglar  18 units daily, metformin  500 mg BID Monitored with Libre 3+ sensor, reports good control with sugars at home   PERTINENT  PMH / PSH: reviewed and updated.  OBJECTIVE:   BP 136/76   Pulse 78   Wt 195 lb (88.5 kg)   SpO2 98%   BMI 33.47 kg/m   Well-appearing, no acute distress Cardio: Regular rate, regular rhythm, no murmurs on exam. Pulm: Clear, no wheezing, no crackles. No increased work of breathing Abdominal: bowel sounds present, soft, non-tender, non-distended Extremities: no peripheral edema  Neuro: alert and oriented x3, speech normal in content, no facial asymmetry, strength intact and equal bilaterally in UE and LE, pupils equal and reactive to light.   Foot exam: +2 pedal pulses, skin intact without breakdown  ASSESSMENT/PLAN:   Assessment & Plan Type 2 diabetes mellitus with hyperglycemia, with long-term current use of insulin  (HCC) Due for A1c, BMP, urine microalbumin  On review of Dexcom sensor he is within goal with reported A1c of 7.4 per sensor reading  Continue basaglar  18 units daily and metformin  500 mg daily  Can consider SGLT2i in future with concurrent CKD  Type 2 diabetes mellitus with hyperlipidemia (HCC) Recheck lipid panel  Type 2 diabetes mellitus without complication, without long-term current use of insulin  (HCC) refilled Essential hypertension, benign Refilled BP well controlled and at goal  Coronary artery disease involving native coronary artery of native heart without angina pectoris Refilled plavix       Damien Pinal, DO [facility]

## 2023-12-19 NOTE — Assessment & Plan Note (Signed)
 Refilled BP well controlled and at goal

## 2023-12-19 NOTE — Assessment & Plan Note (Signed)
Refilled plavix 

## 2023-12-19 NOTE — Assessment & Plan Note (Signed)
 Recheck lipid panel

## 2023-12-20 LAB — LIPID PANEL
Chol/HDL Ratio: 3.2 ratio (ref 0.0–5.0)
Cholesterol, Total: 171 mg/dL (ref 100–199)
HDL: 54 mg/dL (ref >39)
LDL Chol Calc (NIH): 89 mg/dL (ref 0–99)
Triglycerides: 160 mg/dL — ABNORMAL HIGH (ref 0–149)
VLDL Cholesterol Cal: 28 mg/dL (ref 5–40)

## 2023-12-20 LAB — MICROALBUMIN / CREATININE URINE RATIO
Creatinine, Urine: 50.3 mg/dL
Microalb/Creat Ratio: 938 mg/g{creat} — ABNORMAL HIGH (ref 0–29)
Microalbumin, Urine: 472 ug/mL

## 2023-12-20 LAB — BASIC METABOLIC PANEL WITH GFR
BUN/Creatinine Ratio: 18 (ref 10–24)
BUN: 40 mg/dL — ABNORMAL HIGH (ref 8–27)
CO2: 24 mmol/L (ref 20–29)
Calcium: 9.8 mg/dL (ref 8.6–10.2)
Chloride: 101 mmol/L (ref 96–106)
Creatinine, Ser: 2.22 mg/dL — ABNORMAL HIGH (ref 0.76–1.27)
Glucose: 176 mg/dL — ABNORMAL HIGH (ref 70–99)
Potassium: 4.6 mmol/L (ref 3.5–5.2)
Sodium: 142 mmol/L (ref 134–144)
eGFR: 31 mL/min/1.73 — ABNORMAL LOW (ref >59)

## 2023-12-21 ENCOUNTER — Ambulatory Visit: Payer: Self-pay | Admitting: Student

## 2023-12-23 ENCOUNTER — Emergency Department (HOSPITAL_COMMUNITY)

## 2023-12-23 ENCOUNTER — Encounter (HOSPITAL_COMMUNITY): Payer: Self-pay

## 2023-12-23 ENCOUNTER — Other Ambulatory Visit: Payer: Self-pay

## 2023-12-23 ENCOUNTER — Emergency Department (HOSPITAL_COMMUNITY)
Admission: EM | Admit: 2023-12-23 | Discharge: 2023-12-23 | Disposition: A | Discharge status: Home / Self Care | Attending: Emergency Medicine | Admitting: Emergency Medicine

## 2023-12-23 DIAGNOSIS — Z7982 Long term (current) use of aspirin: Secondary | ICD-10-CM | POA: Insufficient documentation

## 2023-12-23 DIAGNOSIS — I2510 Atherosclerotic heart disease of native coronary artery without angina pectoris: Secondary | ICD-10-CM | POA: Insufficient documentation

## 2023-12-23 DIAGNOSIS — Z7984 Long term (current) use of oral hypoglycemic drugs: Secondary | ICD-10-CM | POA: Diagnosis not present

## 2023-12-23 DIAGNOSIS — E1122 Type 2 diabetes mellitus with diabetic chronic kidney disease: Secondary | ICD-10-CM | POA: Insufficient documentation

## 2023-12-23 DIAGNOSIS — N189 Chronic kidney disease, unspecified: Secondary | ICD-10-CM | POA: Insufficient documentation

## 2023-12-23 DIAGNOSIS — M5136 Other intervertebral disc degeneration, lumbar region with discogenic back pain only: Secondary | ICD-10-CM | POA: Diagnosis not present

## 2023-12-23 DIAGNOSIS — R000 Tachycardia, unspecified: Secondary | ICD-10-CM | POA: Diagnosis not present

## 2023-12-23 DIAGNOSIS — Z794 Long term (current) use of insulin: Secondary | ICD-10-CM | POA: Diagnosis not present

## 2023-12-23 DIAGNOSIS — M5416 Radiculopathy, lumbar region: Secondary | ICD-10-CM | POA: Insufficient documentation

## 2023-12-23 DIAGNOSIS — M545 Low back pain, unspecified: Secondary | ICD-10-CM | POA: Diagnosis present

## 2023-12-23 DIAGNOSIS — R531 Weakness: Secondary | ICD-10-CM | POA: Diagnosis not present

## 2023-12-23 DIAGNOSIS — M47816 Spondylosis without myelopathy or radiculopathy, lumbar region: Secondary | ICD-10-CM | POA: Diagnosis not present

## 2023-12-23 DIAGNOSIS — M549 Dorsalgia, unspecified: Secondary | ICD-10-CM | POA: Diagnosis not present

## 2023-12-23 LAB — COMPREHENSIVE METABOLIC PANEL WITH GFR
ALT: 15 U/L (ref 0–44)
AST: 20 U/L (ref 15–41)
Albumin: 3.8 g/dL (ref 3.5–5.0)
Alkaline Phosphatase: 54 U/L (ref 38–126)
Anion gap: 14 (ref 5–15)
BUN: 38 mg/dL — ABNORMAL HIGH (ref 8–23)
CO2: 22 mmol/L (ref 22–32)
Calcium: 9.1 mg/dL (ref 8.9–10.3)
Chloride: 101 mmol/L (ref 98–111)
Creatinine, Ser: 2.57 mg/dL — ABNORMAL HIGH (ref 0.61–1.24)
GFR, Estimated: 26 mL/min — ABNORMAL LOW (ref >60)
Glucose, Bld: 158 mg/dL — ABNORMAL HIGH (ref 70–99)
Potassium: 3.8 mmol/L (ref 3.5–5.1)
Sodium: 137 mmol/L (ref 135–145)
Total Bilirubin: 0.6 mg/dL (ref 0.0–1.2)
Total Protein: 7.6 g/dL (ref 6.5–8.1)

## 2023-12-23 LAB — CBC
HCT: 37.5 % — ABNORMAL LOW (ref 39.0–52.0)
Hemoglobin: 11.6 g/dL — ABNORMAL LOW (ref 13.0–17.0)
MCH: 28.2 pg (ref 26.0–34.0)
MCHC: 30.9 g/dL (ref 30.0–36.0)
MCV: 91.2 fL (ref 80.0–100.0)
Platelets: 383 K/uL (ref 150–400)
RBC: 4.11 MIL/uL — ABNORMAL LOW (ref 4.22–5.81)
RDW: 13.7 % (ref 11.5–15.5)
WBC: 9.9 K/uL (ref 4.0–10.5)
nRBC: 0 % (ref 0.0–0.2)

## 2023-12-23 MED ORDER — LIDOCAINE 5 % EX PTCH: 1.0000 | MEDICATED_PATCH | CUTANEOUS | 0 refills | Status: DC

## 2023-12-23 MED ORDER — ACETAMINOPHEN 500 MG PO TABS
1000.0000 mg | ORAL_TABLET | ORAL | Status: AC
  Administered 2023-12-23: 1000 mg via ORAL
  Filled 2023-12-23: qty 2

## 2023-12-23 MED ORDER — CYCLOBENZAPRINE HCL 10 MG PO TABS: 5.0000 mg | ORAL_TABLET | Freq: Two times a day (BID) | ORAL | 0 refills | Status: DC | PRN

## 2023-12-23 MED ORDER — CYCLOBENZAPRINE HCL 10 MG PO TABS: 5.0000 mg | ORAL_TABLET | Freq: Two times a day (BID) | ORAL | 0 refills | Status: AC | PRN

## 2023-12-23 MED ORDER — LIDOCAINE 5 % EX PTCH
1.0000 | MEDICATED_PATCH | CUTANEOUS | Status: DC
  Administered 2023-12-23: 1 via TRANSDERMAL
  Filled 2023-12-23: qty 1

## 2023-12-23 MED ORDER — LIDOCAINE 5 % EX PTCH: 1.0000 | MEDICATED_PATCH | CUTANEOUS | 0 refills | Status: AC

## 2023-12-23 NOTE — Discharge Instructions (Signed)
You were seen for your back pain in the emergency department.   At home, please use over-the-counter Tylenol and lidocaine patches.  You may also use the cyclobenzaprine we have prescribed you as needed for pain.  Do not take the cyclobenzaprine before driving or operating heavy machinery.  Follow-up with your primary doctor in 2-3 days regarding your visit.  Follow-up with the spine clinic as soon as possible regarding your symptoms.  Return immediately to the emergency department if you experience any of the following: Numbness or weakness of your legs, bowel or bladder incontinence, numbness while wiping after pooping or urinating, or any other concerning symptoms.    Thank you for visiting our Emergency Department. It was a pleasure taking care of you today.

## 2023-12-23 NOTE — ED Provider Notes (Signed)
 Patient seen after prior EDP.  Patient is comfortable.  He desires discharge.  Findings of workup discussed extensively with the patient.  Importance of close follow-up stressed.  Strict return precautions given and understood.   Laurice Maude BROCKS, MD 12/23/23 [phone]

## 2023-12-23 NOTE — ED Provider Notes (Signed)
 [place] EMERGENCY DEPARTMENT AT [facility] Provider Note   CSN: [id number] Arrival date & time: 12/23/23  1024     Patient presents with: Leg Pain   [name] is a 73 y.o. male.   73 year old male with a history of MI, CAD, and diabetes who presents to the emergency department with back pain radiating down his left leg.  On Monday he was helping to work on a chair where he had to get on his hands and knees.  Also has been sitting in his seat that he thinks may have been causing issues with his back.  Tuesday of this week he started experiencing left lower back pain that radiates down his left lower extremity to his toes.  Describes it as a sharp sensation.  Worsened with movement.  No bowel or bladder incontinence.  No leg weakness or numbness.  No fevers.  No history of IV drug use or indwelling catheters.  No back surgeries.  On aspirin  and Plavix  but no other anticoagulation       Prior to Admission medications   Medication Sig Start Date End Date Taking? Authorizing Provider  cyclobenzaprine  (FLEXERIL ) 10 MG tablet Take 0.5 tablets (5 mg total) by mouth 2 (two) times daily as needed for muscle spasms. 12/23/23  Yes Yolande Lamar BROCKS, MD  lidocaine  (LIDODERM ) 5 % Place 1 patch onto the skin daily. Remove & Discard patch within 12 hours or as directed by MD 12/23/23  Yes Yolande Lamar BROCKS, MD  Accu-Chek Softclix Lancets lancets USE AS DIRECTED UP TO FOUR TIMES DAILY 10/16/23   Cleotilde Perkins, DO  aspirin  EC 81 MG tablet Take 81 mg by mouth daily.    [provider]  atorvastatin  (LIPITOR ) 80 MG tablet Take 1 tablet (80 mg total) by mouth daily. 12/19/23   Cleotilde Perkins, DO  BD PEN NEEDLE NANO 2ND GEN 32G X 4 MM MISC USE 3 TIMES DAILY AS NEEDED BETWEEN MEALS AND BEDTIME 01/25/23   Cleotilde Perkins, DO  blood glucose meter kit and supplies Dispense based on patient and insurance preference. Use up to four times daily as directed. (FOR ICD-10 E10.9, E11.9). 05/13/20    Lilland, Alana, DO  Blood Glucose Monitoring Suppl (ACCU-CHEK GUIDE ME) w/Device KIT Please use to check blood sugar level up to four times daily. 10/16/23   Cleotilde Perkins, DO  carvedilol  (COREG ) 6.25 MG tablet Take 1 tablet (6.25 mg total) by mouth 2 (two) times daily with a meal. 12/19/23   Cleotilde Perkins, DO  clopidogrel  (PLAVIX ) 75 MG tablet Take 1 tablet (75 mg total) by mouth daily. 12/19/23   Cleotilde Perkins, DO  Continuous Glucose Sensor (FREESTYLE LIBRE 3 PLUS SENSOR) MISC Change sensor every 15 days. 12/01/23   Cleotilde Perkins, DO  Continuous Glucose Sensor (FREESTYLE LIBRE 3 SENSOR) MISC Place 1 sensor on the skin every 14 days. Use to check glucose continuously 12/19/23   Cleotilde Perkins, DO  Fe Fum-Vit C-Vit B12-FA (TRIGELS-F FORTE) CAPS capsule Take 1 capsule by mouth daily after breakfast. 10/16/23   Cleotilde Perkins, DO  glucose blood (ACCU-CHEK GUIDE TEST) test strip Please use to check blood sugar up to 4 times daily. 10/16/23   Cleotilde Perkins, DO  hydrALAZINE  (APRESOLINE ) 50 MG tablet Take 1 tablet (50 mg total) by mouth 3 (three) times daily. 12/19/23   Cleotilde Perkins, DO  Insulin  Glargine (BASAGLAR  KWIKPEN) 100 UNIT/ML Inject 18 Units into the skin daily. 11/02/23   Cleotilde Perkins, DO  isosorbide  mononitrate (IMDUR ) 30  MG 24 hr tablet Take 1 tablet (30 mg total) by mouth daily. 12/19/23 12/18/24  Cleotilde Perkins, DO  metFORMIN  (GLUCOPHAGE -XR) 500 MG 24 hr tablet Take 1 tablet (500 mg total) by mouth 2 (two) times daily with a meal. 12/19/23   Cleotilde Perkins, DO  potassium chloride  SA (KLOR-CON  M) 20 MEQ tablet Take 1 tablet (20 mEq total) by mouth daily. 12/19/23   Cleotilde Perkins, DO  torsemide  (DEMADEX ) 20 MG tablet Take 3 tablets (60 mg total) by mouth daily. 12/19/23   Cleotilde Perkins, DO  traMADol  (ULTRAM ) 50 MG tablet Take 1 tablet (50 mg total) by mouth every 6 (six) hours as needed for moderate pain (pain score 4-6). 10/13/23   Raguel Con RAMAN, PA-C    Allergies: Trulicity [dulaglutide]    Review of  Systems  Updated Vital Signs BP (!) 157/88   Pulse 90   Temp 98.4 F (36.9 C)   Resp 20   Ht 5' 4 (1.626 m)   Wt 86.6 kg   SpO2 100%   BMI 32.79 kg/m   Physical Exam Musculoskeletal:     Comments: No spinal midline TTP in cervical, thoracic, or lumbar spine. Paraspinal TTP on L at L5. No stepoffs noted.   Motor: Muscle bulk and tone are normal. Strength is 5/5 in hip flexion, knee flexion and extension, ankle dorsiflexion and plantar flexion bilaterally. Full strength of great toe dorsiflexion bilaterally.  Sensory: Intact sensation to light touch in L2 though S1 dermatomes bilaterally.   Reflexes: Patellar 2+ bilaterally, Achilles 2+ bilaterally, no ankle clonus bilaterally.  Negative straight leg raise bilaterally     (all labs ordered are listed, but only abnormal results are displayed) Labs Reviewed  COMPREHENSIVE METABOLIC PANEL WITH GFR - Abnormal; Notable for the following components:      Result Value   Glucose, Bld 158 (*)    BUN 38 (*)    Creatinine, Ser 2.57 (*)    GFR, Estimated 26 (*)    All other components within normal limits  CBC - Abnormal; Notable for the following components:   RBC 4.11 (*)    Hemoglobin 11.6 (*)    HCT 37.5 (*)    All other components within normal limits  URINALYSIS, ROUTINE W REFLEX MICROSCOPIC  CBG MONITORING, ED    EKG: EKG Interpretation Date/Time:  Saturday December 23 2023 10:37:54 EDT Ventricular Rate:  109 PR Interval:  164 QRS Duration:  128 QT Interval:  358 QTC Calculation: 482 R Axis:   68  Text Interpretation: Sinus tachycardia Non-specific intra-ventricular conduction block Cannot rule out Anteroseptal infarct , age undetermined T wave abnormality, consider inferolateral ischemia Abnormal ECG When compared with ECG of 24-Oct-2023 13:34,  new t-wave inversions in inverior and lateral leads Confirmed by Yolande Charleston [id number]) on 12/23/2023 2:31:09 PM  Radiology: DG Lumbar Spine Complete Result Date:  12/23/2023 EXAM: 4 VIEW(S) XRAY OF THE LUMBAR SPINE 12/23/2023 03:29:39 PM COMPARISON: 04/12/2020 CT abdomen/pelvis CLINICAL HISTORY: Back pain, radiating down LLE. Per chart - Pt c/o left back pain radiating into left leg. Pt states he feels weak. Pt denies nausea, vomiting, numbness or tingling. FINDINGS: LUMBAR SPINE: BONES: No acute fracture. No aggressive appearing osseous lesion. Alignment is normal. DISCS AND DEGENERATIVE CHANGES: Mild multilevel lumbar degenerative disc disease, most prominent at L5-S1. SOFT TISSUES: No acute abnormality. VASCULATURE: Abdominal aortic atherosclerotic calcification. IMPRESSION: 1. No acute abnormality of the lumbar spine. 2. Mild multilevel lumbar degenerative disc disease, most prominent at L5-S1. Electronically signed by: Selinda Blue  MD 12/23/2023 04:01 PM EDT RP Workstation: [id number]     Procedures   Medications Ordered in the ED  lidocaine  (LIDODERM ) 5 % 1 patch (has no administration in time range)  acetaminophen  (TYLENOL ) tablet 1,000 mg (has no administration in time range)                                    Medical Decision Making Amount and/or Complexity of Data Reviewed Labs: ordered. Radiology: ordered.   [name] is a 73 year old male with a history of MI, CAD, and diabetes who presents to the emergency department with back pain radiating down his left leg.   Initial Ddx:  Lumbar radiculopathy, spinal cord compression, pathologic fracture, spinal epidural abscess, spinal epidural hematoma  MDM:  Feel the patient likely has lumbar radiculopathy given their symptoms.  Likely injured it or aggravated when he was working on the ground or standing on the chair that he is concerned about.  No signs or symptoms of cord compression such as bowel or bladder incontinence or numbness or weakness that would warrant MRI.  Given his age we will obtain x-rays to rule out pathologic fracture.  No risk factors for spinal epidural abscess or spinal  epidural hematoma either.  Plan:  X-ray Pain medication  ED Summary/Re-evaluation:  Patient had blood work drawn from triage.  CBC unremarkable.  CMP shows CKD with renal function consistent with priors.  Did have an EKG which shows some ST changes the patient is not complaining of any chest discomfort or any other anginal equivalents at this time.  He is already following with cardiology and recently had revascularization.  Do not feel that any acute invention is required for this.  Signed out to the oncoming physician awaiting x-ray results.  This patient presents to the ED for concern of complaints listed in HPI, this involves an extensive number of treatment options, and is a complaint that carries with it a high risk of complications and morbidity. Disposition including potential need for admission considered.   Dispo: Pending remainder of workup  Additional history obtained from spouse Records reviewed Outpatient Clinic Notes The following labs were independently interpreted: Chemistry and show CKD I personally reviewed and interpreted the pt's EKG: see above for interpretation  I have reviewed the patients home medications and made adjustments as needed Social Determinants of health:  Geriatric   Final diagnoses:  Lumbar radiculopathy    ED Discharge Orders          Ordered    lidocaine  (LIDODERM ) 5 %  Every 24 hours        12/23/23 1651    cyclobenzaprine  (FLEXERIL ) 10 MG tablet  2 times daily PRN        12/23/23 1651               Yolande Lamar BROCKS, MD 12/23/23 1654

## 2023-12-23 NOTE — ED Triage Notes (Signed)
 Pt c/o left back pain radiating into left leg. Pt states he feels weak. Pt denies nausea, vomiting, numbness or tingling.

## 2023-12-25 ENCOUNTER — Encounter: Payer: Self-pay | Admitting: Student

## 2023-12-25 DIAGNOSIS — E1165 Type 2 diabetes mellitus with hyperglycemia: Secondary | ICD-10-CM

## 2023-12-25 DIAGNOSIS — I10 Essential (primary) hypertension: Secondary | ICD-10-CM

## 2023-12-25 DIAGNOSIS — E1169 Type 2 diabetes mellitus with other specified complication: Secondary | ICD-10-CM

## 2023-12-25 DIAGNOSIS — I2510 Atherosclerotic heart disease of native coronary artery without angina pectoris: Secondary | ICD-10-CM

## 2023-12-26 ENCOUNTER — Ambulatory Visit (HOSPITAL_COMMUNITY)
Admission: RE | Admit: 2023-12-26 | Discharge: 2023-12-26 | Disposition: A | Discharge status: Home / Self Care | Source: Ambulatory Visit | Attending: Cardiology | Admitting: Cardiology

## 2023-12-26 ENCOUNTER — Ambulatory Visit (HOSPITAL_COMMUNITY): Payer: Self-pay | Admitting: Cardiology

## 2023-12-26 ENCOUNTER — Telehealth (HOSPITAL_COMMUNITY): Payer: Self-pay

## 2023-12-26 ENCOUNTER — Other Ambulatory Visit (HOSPITAL_COMMUNITY): Payer: Self-pay

## 2023-12-26 ENCOUNTER — Ambulatory Visit (HOSPITAL_BASED_OUTPATIENT_CLINIC_OR_DEPARTMENT_OTHER)
Admission: RE | Admit: 2023-12-26 | Discharge: 2023-12-26 | Disposition: A | Discharge status: Home / Self Care | Source: Ambulatory Visit | Attending: Cardiology | Admitting: Cardiology

## 2023-12-26 VITALS — BP 138/80 | HR 107 | Wt 188.0 lb

## 2023-12-26 DIAGNOSIS — E1122 Type 2 diabetes mellitus with diabetic chronic kidney disease: Secondary | ICD-10-CM | POA: Insufficient documentation

## 2023-12-26 DIAGNOSIS — I447 Left bundle-branch block, unspecified: Secondary | ICD-10-CM | POA: Diagnosis not present

## 2023-12-26 DIAGNOSIS — R000 Tachycardia, unspecified: Secondary | ICD-10-CM | POA: Insufficient documentation

## 2023-12-26 DIAGNOSIS — Z5986 Financial insecurity: Secondary | ICD-10-CM | POA: Insufficient documentation

## 2023-12-26 DIAGNOSIS — Z794 Long term (current) use of insulin: Secondary | ICD-10-CM | POA: Insufficient documentation

## 2023-12-26 DIAGNOSIS — I2510 Atherosclerotic heart disease of native coronary artery without angina pectoris: Secondary | ICD-10-CM | POA: Diagnosis not present

## 2023-12-26 DIAGNOSIS — I252 Old myocardial infarction: Secondary | ICD-10-CM | POA: Diagnosis not present

## 2023-12-26 DIAGNOSIS — E785 Hyperlipidemia, unspecified: Secondary | ICD-10-CM | POA: Diagnosis not present

## 2023-12-26 DIAGNOSIS — Z7982 Long term (current) use of aspirin: Secondary | ICD-10-CM | POA: Diagnosis not present

## 2023-12-26 DIAGNOSIS — I255 Ischemic cardiomyopathy: Secondary | ICD-10-CM | POA: Diagnosis not present

## 2023-12-26 DIAGNOSIS — Z7984 Long term (current) use of oral hypoglycemic drugs: Secondary | ICD-10-CM | POA: Diagnosis not present

## 2023-12-26 DIAGNOSIS — I5022 Chronic systolic (congestive) heart failure: Secondary | ICD-10-CM | POA: Insufficient documentation

## 2023-12-26 DIAGNOSIS — I130 Hypertensive heart and chronic kidney disease with heart failure and stage 1 through stage 4 chronic kidney disease, or unspecified chronic kidney disease: Secondary | ICD-10-CM | POA: Insufficient documentation

## 2023-12-26 DIAGNOSIS — Z951 Presence of aortocoronary bypass graft: Secondary | ICD-10-CM | POA: Insufficient documentation

## 2023-12-26 DIAGNOSIS — I10 Essential (primary) hypertension: Secondary | ICD-10-CM | POA: Diagnosis not present

## 2023-12-26 DIAGNOSIS — I358 Other nonrheumatic aortic valve disorders: Secondary | ICD-10-CM | POA: Insufficient documentation

## 2023-12-26 DIAGNOSIS — Z955 Presence of coronary angioplasty implant and graft: Secondary | ICD-10-CM | POA: Diagnosis not present

## 2023-12-26 DIAGNOSIS — Z79899 Other long term (current) drug therapy: Secondary | ICD-10-CM | POA: Insufficient documentation

## 2023-12-26 DIAGNOSIS — Z87891 Personal history of nicotine dependence: Secondary | ICD-10-CM | POA: Diagnosis not present

## 2023-12-26 DIAGNOSIS — Z7902 Long term (current) use of antithrombotics/antiplatelets: Secondary | ICD-10-CM | POA: Insufficient documentation

## 2023-12-26 DIAGNOSIS — Z555 Less than a high school diploma: Secondary | ICD-10-CM | POA: Diagnosis not present

## 2023-12-26 DIAGNOSIS — N1832 Chronic kidney disease, stage 3b: Secondary | ICD-10-CM | POA: Diagnosis not present

## 2023-12-26 LAB — ECHOCARDIOGRAM COMPLETE
AR max vel: 2.23 cm2
AV Area VTI: 2.46 cm2
AV Area mean vel: 2.19 cm2
AV Mean grad: 3 mmHg
AV Peak grad: 5.7 mmHg
Ao pk vel: 1.2 m/s
Calc EF: 38.6 %
Est EF: 30
S' Lateral: 3.9 cm
Single Plane A2C EF: 37.3 %
Single Plane A4C EF: 36 %

## 2023-12-26 LAB — BASIC METABOLIC PANEL WITH GFR
Anion gap: 12 (ref 5–15)
BUN: 32 mg/dL — ABNORMAL HIGH (ref 8–23)
CO2: 26 mmol/L (ref 22–32)
Calcium: 9.7 mg/dL (ref 8.9–10.3)
Chloride: 102 mmol/L (ref 98–111)
Creatinine, Ser: 2.29 mg/dL — ABNORMAL HIGH (ref 0.61–1.24)
GFR, Estimated: 29 mL/min — ABNORMAL LOW (ref >60)
Glucose, Bld: 114 mg/dL — ABNORMAL HIGH (ref 70–99)
Potassium: 3.8 mmol/L (ref 3.5–5.1)
Sodium: 140 mmol/L (ref 135–145)

## 2023-12-26 LAB — LIPID PANEL
Cholesterol: 129 mg/dL (ref 0–200)
HDL: 47 mg/dL (ref >40)
LDL Cholesterol: 51 mg/dL (ref 0–99)
Total CHOL/HDL Ratio: 2.7 ratio
Triglycerides: 155 mg/dL — ABNORMAL HIGH (ref <150)
VLDL: 31 mg/dL (ref 0–40)

## 2023-12-26 LAB — BRAIN NATRIURETIC PEPTIDE: B Natriuretic Peptide: 189.8 pg/mL — ABNORMAL HIGH (ref 0.0–100.0)

## 2023-12-26 MED ORDER — EMPAGLIFLOZIN 10 MG PO TABS: 10.0000 mg | ORAL_TABLET | Freq: Every day | ORAL | 5 refills | Status: AC

## 2023-12-26 MED ORDER — TORSEMIDE 20 MG PO TABS: 40.0000 mg | ORAL_TABLET | Freq: Every day | ORAL | 5 refills | Status: DC

## 2023-12-26 MED ORDER — PERFLUTREN LIPID MICROSPHERE
1.0000 mL | INTRAVENOUS | Status: AC | PRN
  Administered 2023-12-26: 4 mL via INTRAVENOUS

## 2023-12-26 MED ORDER — CARVEDILOL 12.5 MG PO TABS: 12.5000 mg | ORAL_TABLET | Freq: Two times a day (BID) | ORAL | 5 refills | Status: DC

## 2023-12-26 NOTE — Telephone Encounter (Signed)
 Advanced Heart Failure Patient Advocate Encounter  The patient was approved for a [employer] grant that will help cover the cost of Carvedilol , Jardiance .  Total amount awarded, $7,500.  Effective: 11/26/2023 - 11/24/2024.  BIN [id number] PCN [id number] [id number] ID [id number]  Pharmacy provided with approval and processing information by phone. Patient informed while in office.  Rachel DEL, CPhT Rx Patient Advocate Phone: [phone]

## 2023-12-26 NOTE — Progress Notes (Addendum)
 Paramedicine Encounter   Patient ID: [name] , male,   DOB: [birth year],73 y.o.,  MRN: [id number]  Met patient in clinic today with provider.  Weight @ clinic-188  B/P-138/80 P-107 [id number]  Med changes-increase carvedilol  to 12.5mg  BID and start on jardiance  10mg  -labs done in 10 days torsemide  decrease to 40mg  daily once jardiance  is started.   Wife reports that last time his insurance didn't cover the cost of jardiance  but I told her to reach out to me once pharmacy fills it and tell me how much it is.   Pt had echo prior to appoint today.  Did not get good images during echo-he has to go back to get the contrast echo done. Doc is looking for blood clots in his heart.  Heart is still around 25-30%.  Kidney functions stable.   Pt denies c/p or tingling to his chest area since I seen him last time.   B/p average is around 130/80--sometimes a few numbers higher and sometimes a few less  HR-90s-100s   They prefer to go back to [employer].   Due to his heart function he would qualify for defib/pacer and help his LBBB and get back in sync.  Referral being sent to EP.   Referral being sent back in for cardiac rehab.   He had to go to ER for leg pain and he was told it was sciatica, prescribed muscle relaxer for it.    Izetta Quivers, EMT-Paramedic [phone] 12/26/2023

## 2023-12-26 NOTE — Patient Instructions (Signed)
 Medication Changes:  INCREASE CARVEDILOL  TO 12.5MG  TWICE DAILY   DECREASE TORSEMIDE  TO 40MG  ONCE DAILY   START JARDIANCE  10MG  ONCE DAILY   Lab Work:  Labs done today, your results will be available in MyChart, we will contact you for abnormal readings.  THEN LABS AGAIN IN 10 DAYS AS SCHEDULED   Referrals:  YOU HAVE BEEN REFERRED TO CARDIAC REHAB THEY WILL REACH OUT TO YOU OR CALL TO ARRANGE THIS. PLEASE CALL US  WITH ANY CONCERNS   Follow-Up in: 6 WEEKS AS SCHEDULED WITH APP CLINIC   At the [facility], you and your health needs are our priority. We have a designated team specialized in the treatment of Heart Failure. This Care Team includes your primary Heart Failure Specialized Cardiologist (physician), Advanced Practice Providers (APPs- Physician Assistants and Nurse Practitioners), and Pharmacist who all work together to provide you with the care you need, when you need it.   You may see any of the following providers on your designated Care Team at your next follow up:  Dr. Toribio Fuel Dr. Ezra Shuck Dr. Ria Commander Dr. Odis Brownie Greig Mosses, NP Caffie Shed, [place] [facility] [place], [place] Beckey Coe, NP [place] Lee, NP Tinnie Redman, PharmD   Please be sure to bring in all your medications bottles to every appointment.   Need to Contact Us :  If you have any questions or concerns before your next appointment please send us  a message through [facility] or call our office at [phone].    TO LEAVE A MESSAGE FOR THE NURSE SELECT OPTION 2, PLEASE LEAVE A MESSAGE INCLUDING: YOUR NAME DATE OF BIRTH CALL BACK NUMBER REASON FOR CALL**this is important as we prioritize the call backs  YOU WILL RECEIVE A CALL BACK THE SAME DAY AS LONG AS YOU CALL BEFORE 4:00 PM

## 2023-12-26 NOTE — Progress Notes (Signed)
 Advanced [facility] Failure Clinic Note   Referring Physician: PCP: Cleotilde Perkins, DO PCP-Cardiologist: Vinie JAYSON Maxcy, MD  AHF MD: Dr. Rolan   Chief Complaint: f/u for systolic heart failure  HPI:  73 y.o. 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    He was admitted 09/21/23 with epigastric pain radiating to chest and neck. HS troponin elevated to 673. BP markedly elevated. Echo with EF 35-40%, RV not well visualized. Cardiac cath with severe 3 vessel CAD. LVEDP 38-40 mmHg. He was diuresed with IV lasix  and started on GDMT.  He underwent CABG X 4 (LIMA to LAD, SVG to OM1, sequential to OM3, SVG to PDA) by Dr. Kerrin on 09/27/23. There was significant difficulty separating from cardiopulmonary bypass (took 3 attempts). Transferred to the ICU on inotrope/pressors. AHF team consulted to assist w/ post cardiotomy shock. Course was further complicated by AKI due to ATN. Scr rose to > 4 (prior baseline 1.3-1.6). He was started on CRRT for clearance and volume removal. Fortunately, he had renal recovery and able to wean off of CRRT. Also able to wean off pressors/inotropes. Transitioned to PO torsemide  for continued volume management and Imdur /hydralazine  for HF GDMT.  He was discharged home on 10/13/23. D/c wt 195 lb.   Echo was done today and reviewed, EF 30% with no LV thrombus, septal-lateral dyssynchrony due to LBBB, mild RV systolic dysfunction, IVC normal.   He presents today for followup of CHF.  Weight is down 8 lbs.  Chest wall is still a little tender post-CABG.  Occasional dyspnea, no trigger.  Generally does fine walking on flat ground and doing ADLs.  No lightheadedness.  No orthopnea/PND.  Has not excercised much.   ECG (Personally reviewed): NSR, LBBB 130 msec  Labs (8/25): hgb 11.6, K 3.8, creatinine 2.57  Review of Systems: All systems reviewed and negative except as per HPI.   PMH: 1. CAD:   PCI LAD in 2005.  - CABG x 4 (6/25) with LIMA-LAD, SVG-OM1, seq SVG-OM3 and PDA.  2. Type 2 diabetes 3. HTN 4. CKD stage 3 5. Hyperlipidemia 6. Chronic systolic CHF: Ischemic cardiomyopathy.  Echo (6/25) with EF 35-40%, RV poorly visualized => repeat post-CABG in 6/25 with EF 25-30%.  - Echo (9/25): EF 30% with no LV thrombus, septal-lateral dyssynchrony due to LBBB, mild RV systolic dysfunction, IVC normal.  7. LBBB   Current Outpatient Medications  Medication Sig Dispense Refill   Accu-Chek Softclix Lancets lancets USE AS DIRECTED UP TO FOUR TIMES DAILY 100 each 12   aspirin  EC 81 MG tablet Take 81 mg by mouth daily.     atorvastatin  (LIPITOR ) 80 MG tablet Take 1 tablet (80 mg total) by mouth daily. 90 tablet 0   BD PEN NEEDLE NANO 2ND GEN 32G X 4 MM MISC USE 3 TIMES DAILY AS NEEDED BETWEEN MEALS AND BEDTIME 100 each 2   blood glucose meter kit and supplies Dispense based on patient and insurance preference. Use up to four times daily as directed. (FOR ICD-10 E10.9, E11.9). 1 each 0   Blood Glucose Monitoring Suppl (ACCU-CHEK GUIDE ME) w/Device KIT Please use to check blood sugar level up to four times daily. 1 kit 0   clopidogrel  (PLAVIX ) 75 MG tablet Take 1 tablet (75 mg total) by mouth daily. 90 tablet 1   Continuous Glucose Sensor (FREESTYLE LIBRE 3 PLUS SENSOR) MISC Change sensor every 15 days.  1 each 3   Continuous Glucose Sensor (FREESTYLE LIBRE 3 SENSOR) MISC Place 1 sensor on the skin every 14 days. Use to check glucose continuously 2 each 11   cyclobenzaprine  (FLEXERIL ) 10 MG tablet Take 0.5 tablets (5 mg total) by mouth 2 (two) times daily as needed for muscle spasms. 10 tablet 0   empagliflozin  (JARDIANCE ) 10 MG TABS tablet Take 1 tablet (10 mg total) by mouth daily before breakfast. 30 tablet 5   Fe Fum-Vit C-Vit B12-FA (TRIGELS-F FORTE PO) Take 1 tablet by mouth 3 (three) times a week.     glucose blood (ACCU-CHEK GUIDE TEST) test strip Please use to check blood sugar up to 4  times daily. 100 each 12   hydrALAZINE  (APRESOLINE ) 50 MG tablet Take 1 tablet (50 mg total) by mouth 3 (three) times daily. 90 tablet 1   Insulin  Glargine (BASAGLAR  KWIKPEN) 100 UNIT/ML Inject 18 Units into the skin daily.     isosorbide  mononitrate (IMDUR ) 30 MG 24 hr tablet Take 1 tablet (30 mg total) by mouth daily. 90 tablet 1   lidocaine  (LIDODERM ) 5 % Place 1 patch onto the skin daily. Remove & Discard patch within 12 hours or as directed by MD 14 patch 0   metFORMIN  (GLUCOPHAGE -XR) 500 MG 24 hr tablet Take 1 tablet (500 mg total) by mouth 2 (two) times daily with a meal. 180 tablet 3   potassium chloride  SA (KLOR-CON  M) 20 MEQ tablet Take 1 tablet (20 mEq total) by mouth daily. 30 tablet 3   carvedilol  (COREG ) 12.5 MG tablet Take 1 tablet (12.5 mg total) by mouth 2 (two) times daily with a meal. 60 tablet 5   torsemide  (DEMADEX ) 20 MG tablet Take 2 tablets (40 mg total) by mouth daily. 60 tablet 5   No current facility-administered medications for this encounter.   Facility-Administered Medications Ordered in Other Encounters  Medication Dose Route Frequency Provider Last Rate Last Admin   perflutren  lipid microspheres (DEFINITY ) IV suspension  1-10 mL Intravenous PRN Drayce Tawil S, MD   4 mL at 12/26/23 1635    Allergies  Allergen Reactions   Trulicity [Dulaglutide] Other (See Comments)    Patient indicated this caused pancreatitis.       Social History   Socioeconomic History   Marital status: Married    Spouse name: [name]   Number of children: 4   Years of education: 10   Highest education level: 10th grade  Occupational History   Occupation: retired  Tobacco Use   Smoking status: Former    Current packs/day: 0.00    Average packs/day: 0.5 packs/day for 10.0 years (5.0 ttl pk-yrs)    Types: Cigarettes    Start date: 04/26/1983    Quit date: 04/25/1993    Years since quitting: 30.6    Passive exposure: Past   Smokeless tobacco: Never  Vaping Use   Vaping status:  Never Used  Substance and Sexual Activity   Alcohol use: No   Drug use: No   Sexual activity: Yes  Other Topics Concern   Not on file  Social History Narrative   Patient lives with his wife in [place].   Patent has 4 boys, one lives in [place] .    Patient enjoys fishing, camping in the woods, working outside, anything outdoor related.   Social Drivers of Health   Financial Resource Strain: Medium Risk (10/31/2023)   Overall Financial Resource Strain (CARDIA)    Difficulty of Paying Living Expenses: Somewhat hard  Food  Insecurity: Food Insecurity Present (10/31/2023)   Hunger Vital Sign    Worried About Running Out of Food in the Last Year: Sometimes true    Ran Out of Food in the Last Year: Sometimes true  Transportation Needs: No Transportation Needs (10/31/2023)   PRAPARE - Administrator, Civil Service (Medical): No    Lack of Transportation (Non-Medical): No  Physical Activity: Inactive (10/31/2023)   Exercise Vital Sign    Days of Exercise per Week: 0 days    Minutes of Exercise per Session: Not on file  Stress: No Stress Concern Present (10/31/2023)   [employer] of Occupational Health - Occupational Stress Questionnaire    Feeling of Stress: Only a little  Social Connections: Moderately Integrated (10/31/2023)   Social Connection and Isolation Panel    Frequency of Communication with Friends and Family: More than three times a week    Frequency of Social Gatherings with Friends and Family: Patient declined    Attends Religious Services: 1 to 4 times per year    Active Member of [employer] or Organizations: No    Attends Engineer, structural: Not on file    Marital Status: Married  Catering manager Violence: Not At Risk (10/17/2023)   Humiliation, Afraid, Rape, and Kick questionnaire    Fear of Current or Ex-Partner: No    Emotionally Abused: No    Physically Abused: No    Sexually Abused: No      Family History  Problem Relation Age of Onset   Heart  disease Mother    Diabetes Mother    Hypertension Mother    Alcoholism Father    Heart disease Father    Hypertension Sister    Diabetes Brother    Hypercholesterolemia Brother    Osteoporosis Brother    Hypertension Brother    Kidney disease Brother     Vitals:   12/26/23 1439  BP: 138/80  Pulse: (!) 107  SpO2: 97%  Weight: 85.3 kg (188 lb)     PHYSICAL EXAM: General: NAD Neck: No JVD, no thyromegaly or thyroid  nodule.  Lungs: Clear to auscultation bilaterally with normal respiratory effort. CV: Nondisplaced PMI.  Heart regular S1/S2, no S3/S4, no murmur.  No peripheral edema.  No carotid bruit.  Normal pedal pulses.  Abdomen: Soft, nontender, no hepatosplenomegaly, no distention.  Skin: Intact without lesions or rashes.  Neurologic: Alert and oriented x 3.  Psych: Normal affect. Extremities: No clubbing or cyanosis.  HEENT: Normal.   ASSESSMENT & PLAN:  1. Chronic systolic CHF: Ischemic cardiomyopathy. Echo pre-CABG with EF 35-40%, RV hard to visualize. Difficulty separating from cardiopulmonary bypass following CABG 09/27/23. Required inotropic support for post-cardiotomy shock. Repeat Echo 09/30/23 EF 25-30%, RV not well visualized. Recovered and weaned off pressors.  Echo was reviewed today and showed EF 30% with no LV thrombus, septal-lateral dyssynchrony due to LBBB, mild RV systolic dysfunction, IVC normal.  NYHA class II symptoms, not volume overloaded on exam. GDMT limited by CKD stage 3.  - EF remains low 3 months post-CABG, he also has dyssynchrony on echo even though LBBB is not markedly wide (130 msec). Refer to EP for consideration of CRT-D.  - Start Jardiance  10 mg daily and decrease torsemide  to 40 mg daily.  BMET/BNP today, BMET in 10 days.  - Continue Imdur  30 and hydralazine  50 tid.  - Increase Coreg  to 12.5 mg bid.  2. CAD: History of PCI to LAD in 2005.  NSTEMI 09/21/23 with multivessel disease  on LHC. S/p CABG X 4 (LIMA to LAD, SVG to OM1, sequential SVG to  OM3, PDA) on 09/27/23.  No ischemic chest pain.  - Continue ASA + plavix  75 mg daily - Continue atorva 80 mg daily. Check lipids today.  - Refer to cardiac rehab.   3. CKD IIIb: Developed AKI due ATN post-CABG. SCr rose > 4. He required short term CRRT for clearance and volume removal. Fortunately w/ renal recovery. Creatinine has been stable recently around 2.5.  - BMET today.  4. DM II: Per PCP.   Folllowup in 6 wks with APP for medication titration.   I spent 31 minutes reviewing records, interviewing/examining patient, and managing orders.   Ezra Shuck, MD 12/26/23

## 2023-12-27 MED ORDER — CLOPIDOGREL BISULFATE 75 MG PO TABS: 75.0000 mg | ORAL_TABLET | Freq: Every day | ORAL | 1 refills | Status: DC

## 2023-12-27 MED ORDER — ISOSORBIDE MONONITRATE ER 30 MG PO TB24
30.0000 mg | ORAL_TABLET | Freq: Every day | ORAL | 1 refills | Status: AC
Start: 2023-12-27 — End: 2024-12-26

## 2023-12-27 MED ORDER — POTASSIUM CHLORIDE CRYS ER 20 MEQ PO TBCR
20.0000 meq | EXTENDED_RELEASE_TABLET | Freq: Every day | ORAL | 3 refills | Status: DC
Start: 2023-12-27 — End: 2024-03-14

## 2023-12-27 MED ORDER — HYDRALAZINE HCL 50 MG PO TABS
50.0000 mg | ORAL_TABLET | Freq: Three times a day (TID) | ORAL | 1 refills | Status: DC
Start: 2023-12-27 — End: 2024-02-23

## 2023-12-27 MED ORDER — METFORMIN HCL ER 500 MG PO TB24: 500.0000 mg | ORAL_TABLET | Freq: Two times a day (BID) | ORAL | 3 refills | Status: DC

## 2023-12-27 MED ORDER — FREESTYLE LIBRE 3 SENSOR MISC: 2.0000 | 11 refills | Status: DC | PRN

## 2023-12-27 MED ORDER — ATORVASTATIN CALCIUM 80 MG PO TABS
80.0000 mg | ORAL_TABLET | Freq: Every day | ORAL | 0 refills | Status: AC
Start: 2023-12-27 — End: ?

## 2023-12-28 ENCOUNTER — Telehealth (HOSPITAL_COMMUNITY): Payer: Self-pay

## 2023-12-28 ENCOUNTER — Encounter (HOSPITAL_COMMUNITY): Payer: Self-pay

## 2023-12-28 NOTE — Telephone Encounter (Signed)
Attempted to call patient in regards to Cardiac Rehab - LM on VM   Sent letter 

## 2023-12-30 ENCOUNTER — Inpatient Hospital Stay (HOSPITAL_COMMUNITY)
Admission: EM | Admit: 2023-12-30 | Discharge: 2024-01-04 | DRG: 184 | Disposition: A | Discharge status: Skilled Nursing Facility | Attending: Family Medicine | Admitting: Family Medicine

## 2023-12-30 ENCOUNTER — Encounter (HOSPITAL_COMMUNITY): Payer: Self-pay | Admitting: Emergency Medicine

## 2023-12-30 ENCOUNTER — Inpatient Hospital Stay (HOSPITAL_COMMUNITY)

## 2023-12-30 ENCOUNTER — Other Ambulatory Visit: Payer: Self-pay

## 2023-12-30 ENCOUNTER — Emergency Department (HOSPITAL_COMMUNITY)

## 2023-12-30 DIAGNOSIS — M4802 Spinal stenosis, cervical region: Secondary | ICD-10-CM | POA: Diagnosis not present

## 2023-12-30 DIAGNOSIS — N183 Chronic kidney disease, stage 3 unspecified: Secondary | ICD-10-CM | POA: Insufficient documentation

## 2023-12-30 DIAGNOSIS — M4807 Spinal stenosis, lumbosacral region: Secondary | ICD-10-CM | POA: Diagnosis not present

## 2023-12-30 DIAGNOSIS — Z83438 Family history of other disorder of lipoprotein metabolism and other lipidemia: Secondary | ICD-10-CM

## 2023-12-30 DIAGNOSIS — Z789 Other specified health status: Secondary | ICD-10-CM

## 2023-12-30 DIAGNOSIS — S2241XA Multiple fractures of ribs, right side, initial encounter for closed fracture: Secondary | ICD-10-CM | POA: Diagnosis not present

## 2023-12-30 DIAGNOSIS — I130 Hypertensive heart and chronic kidney disease with heart failure and stage 1 through stage 4 chronic kidney disease, or unspecified chronic kidney disease: Secondary | ICD-10-CM | POA: Diagnosis not present

## 2023-12-30 DIAGNOSIS — R404 Transient alteration of awareness: Secondary | ICD-10-CM | POA: Insufficient documentation

## 2023-12-30 DIAGNOSIS — I700 Atherosclerosis of aorta: Secondary | ICD-10-CM | POA: Diagnosis not present

## 2023-12-30 DIAGNOSIS — S0990XA Unspecified injury of head, initial encounter: Secondary | ICD-10-CM | POA: Diagnosis not present

## 2023-12-30 DIAGNOSIS — Z7984 Long term (current) use of oral hypoglycemic drugs: Secondary | ICD-10-CM | POA: Diagnosis not present

## 2023-12-30 DIAGNOSIS — S32001D Stable burst fracture of unspecified lumbar vertebra, subsequent encounter for fracture with routine healing: Secondary | ICD-10-CM | POA: Diagnosis not present

## 2023-12-30 DIAGNOSIS — M48061 Spinal stenosis, lumbar region without neurogenic claudication: Secondary | ICD-10-CM | POA: Diagnosis not present

## 2023-12-30 DIAGNOSIS — Z550 Illiteracy and low-level literacy: Secondary | ICD-10-CM | POA: Diagnosis not present

## 2023-12-30 DIAGNOSIS — S3991XA Unspecified injury of abdomen, initial encounter: Secondary | ICD-10-CM | POA: Diagnosis not present

## 2023-12-30 DIAGNOSIS — S32001A Stable burst fracture of unspecified lumbar vertebra, initial encounter for closed fracture: Secondary | ICD-10-CM | POA: Diagnosis not present

## 2023-12-30 DIAGNOSIS — Z794 Long term (current) use of insulin: Secondary | ICD-10-CM | POA: Diagnosis not present

## 2023-12-30 DIAGNOSIS — R29890 Loss of height: Secondary | ICD-10-CM | POA: Diagnosis not present

## 2023-12-30 DIAGNOSIS — R2689 Other abnormalities of gait and mobility: Secondary | ICD-10-CM | POA: Diagnosis not present

## 2023-12-30 DIAGNOSIS — Z7982 Long term (current) use of aspirin: Secondary | ICD-10-CM

## 2023-12-30 DIAGNOSIS — I5022 Chronic systolic (congestive) heart failure: Secondary | ICD-10-CM | POA: Diagnosis not present

## 2023-12-30 DIAGNOSIS — S32011A Stable burst fracture of first lumbar vertebra, initial encounter for closed fracture: Secondary | ICD-10-CM | POA: Diagnosis present

## 2023-12-30 DIAGNOSIS — Z833 Family history of diabetes mellitus: Secondary | ICD-10-CM | POA: Diagnosis not present

## 2023-12-30 DIAGNOSIS — R531 Weakness: Secondary | ICD-10-CM | POA: Diagnosis not present

## 2023-12-30 DIAGNOSIS — Z841 Family history of disorders of kidney and ureter: Secondary | ICD-10-CM

## 2023-12-30 DIAGNOSIS — R079 Chest pain, unspecified: Secondary | ICD-10-CM | POA: Insufficient documentation

## 2023-12-30 DIAGNOSIS — Z955 Presence of coronary angioplasty implant and graft: Secondary | ICD-10-CM

## 2023-12-30 DIAGNOSIS — M545 Low back pain, unspecified: Secondary | ICD-10-CM | POA: Diagnosis present

## 2023-12-30 DIAGNOSIS — Z79899 Other long term (current) drug therapy: Secondary | ICD-10-CM

## 2023-12-30 DIAGNOSIS — E119 Type 2 diabetes mellitus without complications: Secondary | ICD-10-CM

## 2023-12-30 DIAGNOSIS — E7800 Pure hypercholesterolemia, unspecified: Secondary | ICD-10-CM | POA: Diagnosis present

## 2023-12-30 DIAGNOSIS — S2249XA Multiple fractures of ribs, unspecified side, initial encounter for closed fracture: Secondary | ICD-10-CM | POA: Insufficient documentation

## 2023-12-30 DIAGNOSIS — M47817 Spondylosis without myelopathy or radiculopathy, lumbosacral region: Secondary | ICD-10-CM | POA: Diagnosis not present

## 2023-12-30 DIAGNOSIS — Z7902 Long term (current) use of antithrombotics/antiplatelets: Secondary | ICD-10-CM | POA: Diagnosis not present

## 2023-12-30 DIAGNOSIS — S199XXA Unspecified injury of neck, initial encounter: Secondary | ICD-10-CM | POA: Diagnosis not present

## 2023-12-30 DIAGNOSIS — E1122 Type 2 diabetes mellitus with diabetic chronic kidney disease: Secondary | ICD-10-CM | POA: Diagnosis not present

## 2023-12-30 DIAGNOSIS — Z8249 Family history of ischemic heart disease and other diseases of the circulatory system: Secondary | ICD-10-CM | POA: Diagnosis not present

## 2023-12-30 DIAGNOSIS — E1165 Type 2 diabetes mellitus with hyperglycemia: Secondary | ICD-10-CM

## 2023-12-30 DIAGNOSIS — I2510 Atherosclerotic heart disease of native coronary artery without angina pectoris: Secondary | ICD-10-CM | POA: Diagnosis present

## 2023-12-30 DIAGNOSIS — S32019A Unspecified fracture of first lumbar vertebra, initial encounter for closed fracture: Secondary | ICD-10-CM | POA: Diagnosis not present

## 2023-12-30 DIAGNOSIS — Y92410 Unspecified street and highway as the place of occurrence of the external cause: Secondary | ICD-10-CM

## 2023-12-30 DIAGNOSIS — Z951 Presence of aortocoronary bypass graft: Secondary | ICD-10-CM

## 2023-12-30 DIAGNOSIS — I252 Old myocardial infarction: Secondary | ICD-10-CM | POA: Diagnosis not present

## 2023-12-30 DIAGNOSIS — N1832 Chronic kidney disease, stage 3b: Secondary | ICD-10-CM | POA: Diagnosis present

## 2023-12-30 DIAGNOSIS — S32018A Other fracture of first lumbar vertebra, initial encounter for closed fracture: Secondary | ICD-10-CM | POA: Diagnosis not present

## 2023-12-30 DIAGNOSIS — G9389 Other specified disorders of brain: Secondary | ICD-10-CM | POA: Diagnosis not present

## 2023-12-30 DIAGNOSIS — Z87891 Personal history of nicotine dependence: Secondary | ICD-10-CM

## 2023-12-30 DIAGNOSIS — Z7401 Bed confinement status: Secondary | ICD-10-CM | POA: Diagnosis not present

## 2023-12-30 DIAGNOSIS — I10 Essential (primary) hypertension: Secondary | ICD-10-CM | POA: Diagnosis not present

## 2023-12-30 DIAGNOSIS — I249 Acute ischemic heart disease, unspecified: Secondary | ICD-10-CM | POA: Diagnosis not present

## 2023-12-30 DIAGNOSIS — M6281 Muscle weakness (generalized): Secondary | ICD-10-CM | POA: Diagnosis not present

## 2023-12-30 LAB — COMPREHENSIVE METABOLIC PANEL WITH GFR
ALT: 17 U/L (ref 0–44)
AST: 26 U/L (ref 15–41)
Albumin: 4.1 g/dL (ref 3.5–5.0)
Alkaline Phosphatase: 55 U/L (ref 38–126)
Anion gap: 14 (ref 5–15)
BUN: 33 mg/dL — ABNORMAL HIGH (ref 8–23)
CO2: 22 mmol/L (ref 22–32)
Calcium: 9.6 mg/dL (ref 8.9–10.3)
Chloride: 104 mmol/L (ref 98–111)
Creatinine, Ser: 2.57 mg/dL — ABNORMAL HIGH (ref 0.61–1.24)
GFR, Estimated: 26 mL/min — ABNORMAL LOW (ref >60)
Glucose, Bld: 214 mg/dL — ABNORMAL HIGH (ref 70–99)
Potassium: 4.3 mmol/L (ref 3.5–5.1)
Sodium: 140 mmol/L (ref 135–145)
Total Bilirubin: 0.5 mg/dL (ref 0.0–1.2)
Total Protein: 8.1 g/dL (ref 6.5–8.1)

## 2023-12-30 LAB — CREATININE, SERUM
Creatinine, Ser: 2.43 mg/dL — ABNORMAL HIGH (ref 0.61–1.24)
GFR, Estimated: 27 mL/min — ABNORMAL LOW (ref >60)

## 2023-12-30 LAB — I-STAT CHEM 8, ED
BUN: 36 mg/dL — ABNORMAL HIGH (ref 8–23)
Calcium, Ion: 1.12 mmol/L — ABNORMAL LOW (ref 1.15–1.40)
Chloride: 107 mmol/L (ref 98–111)
Creatinine, Ser: 2.7 mg/dL — ABNORMAL HIGH (ref 0.61–1.24)
Glucose, Bld: 225 mg/dL — ABNORMAL HIGH (ref 70–99)
HCT: 42 % (ref 39.0–52.0)
Hemoglobin: 14.3 g/dL (ref 13.0–17.0)
Potassium: 4.3 mmol/L (ref 3.5–5.1)
Sodium: 142 mmol/L (ref 135–145)
TCO2: 23 mmol/L (ref 22–32)

## 2023-12-30 LAB — CBC
HCT: 41.7 % (ref 39.0–52.0)
HCT: 37.7 % — ABNORMAL LOW (ref 39.0–52.0)
Hemoglobin: 12.8 g/dL — ABNORMAL LOW (ref 13.0–17.0)
Hemoglobin: 12 g/dL — ABNORMAL LOW (ref 13.0–17.0)
MCH: 28.3 pg (ref 26.0–34.0)
MCH: 28.8 pg (ref 26.0–34.0)
MCHC: 30.7 g/dL (ref 30.0–36.0)
MCHC: 31.8 g/dL (ref 30.0–36.0)
MCV: 92.3 fL (ref 80.0–100.0)
MCV: 90.4 fL (ref 80.0–100.0)
Platelets: 295 K/uL (ref 150–400)
Platelets: 328 K/uL (ref 150–400)
RBC: 4.52 MIL/uL (ref 4.22–5.81)
RBC: 4.17 MIL/uL — ABNORMAL LOW (ref 4.22–5.81)
RDW: 14.2 % (ref 11.5–15.5)
RDW: 14.3 % (ref 11.5–15.5)
WBC: 12.7 K/uL — ABNORMAL HIGH (ref 4.0–10.5)
WBC: 11.8 K/uL — ABNORMAL HIGH (ref 4.0–10.5)
nRBC: 0 % (ref 0.0–0.2)
nRBC: 0 % (ref 0.0–0.2)

## 2023-12-30 LAB — GLUCOSE, CAPILLARY: Glucose-Capillary: 205 mg/dL — ABNORMAL HIGH (ref 70–99)

## 2023-12-30 LAB — TROPONIN I (HIGH SENSITIVITY): Troponin I (High Sensitivity): 79 ng/L — ABNORMAL HIGH (ref <18)

## 2023-12-30 MED ORDER — HEPARIN SODIUM (PORCINE) 5000 UNIT/ML IJ SOLN
5000.0000 [IU] | Freq: Three times a day (TID) | INTRAMUSCULAR | Status: DC
  Administered 2023-12-30 – 2024-01-04 (×15): 5000 [IU] via SUBCUTANEOUS
  Filled 2023-12-30 (×15): qty 1

## 2023-12-30 MED ORDER — ACETAMINOPHEN 500 MG PO TABS
1000.0000 mg | ORAL_TABLET | Freq: Three times a day (TID) | ORAL | Status: DC
  Administered 2023-12-30 – 2024-01-01 (×5): 1000 mg via ORAL
  Filled 2023-12-30 (×5): qty 2

## 2023-12-30 MED ORDER — HYDROMORPHONE HCL 1 MG/ML IJ SOLN
0.5000 mg | Freq: Once | INTRAMUSCULAR | Status: AC
  Administered 2023-12-30: 0.5 mg via INTRAVENOUS
  Filled 2023-12-30: qty 1

## 2023-12-30 MED ORDER — CARVEDILOL 12.5 MG PO TABS
12.5000 mg | ORAL_TABLET | Freq: Two times a day (BID) | ORAL | Status: DC
  Administered 2023-12-31 – 2024-01-04 (×9): 12.5 mg via ORAL
  Filled 2023-12-30 (×9): qty 1

## 2023-12-30 MED ORDER — HYDRALAZINE HCL 50 MG PO TABS
50.0000 mg | ORAL_TABLET | Freq: Three times a day (TID) | ORAL | Status: DC
  Administered 2023-12-30 – 2024-01-04 (×14): 50 mg via ORAL
  Filled 2023-12-30 (×14): qty 1

## 2023-12-30 MED ORDER — MORPHINE SULFATE (PF) 2 MG/ML IV SOLN
1.0000 mg | INTRAVENOUS | Status: DC | PRN
  Administered 2023-12-30 – 2023-12-31 (×3): 2 mg via INTRAVENOUS
  Filled 2023-12-30 (×3): qty 1

## 2023-12-30 MED ORDER — EMPAGLIFLOZIN 10 MG PO TABS
10.0000 mg | ORAL_TABLET | Freq: Every day | ORAL | Status: DC
  Administered 2023-12-31 – 2024-01-04 (×5): 10 mg via ORAL
  Filled 2023-12-30 (×5): qty 1

## 2023-12-30 MED ORDER — ATORVASTATIN CALCIUM 80 MG PO TABS
80.0000 mg | ORAL_TABLET | Freq: Every day | ORAL | Status: DC
  Administered 2023-12-30 – 2024-01-03 (×5): 80 mg via ORAL
  Filled 2023-12-30 (×5): qty 1

## 2023-12-30 MED ORDER — POTASSIUM CHLORIDE CRYS ER 20 MEQ PO TBCR
20.0000 meq | EXTENDED_RELEASE_TABLET | Freq: Every day | ORAL | Status: DC
  Administered 2023-12-31 – 2024-01-04 (×5): 20 meq via ORAL
  Filled 2023-12-30 (×5): qty 1

## 2023-12-30 MED ORDER — INSULIN ASPART 100 UNIT/ML IJ SOLN
0.0000 [IU] | Freq: Three times a day (TID) | INTRAMUSCULAR | Status: DC
  Administered 2023-12-31 (×2): 3 [IU] via SUBCUTANEOUS
  Administered 2023-12-31 – 2024-01-01 (×2): 5 [IU] via SUBCUTANEOUS
  Administered 2024-01-01: 3 [IU] via SUBCUTANEOUS
  Administered 2024-01-01 – 2024-01-02 (×2): 5 [IU] via SUBCUTANEOUS
  Administered 2024-01-02: 3 [IU] via SUBCUTANEOUS
  Administered 2024-01-02: 2 [IU] via SUBCUTANEOUS
  Administered 2024-01-03: 5 [IU] via SUBCUTANEOUS
  Administered 2024-01-03: 3 [IU] via SUBCUTANEOUS
  Administered 2024-01-04: 5 [IU] via SUBCUTANEOUS
  Administered 2024-01-04: 2 [IU] via SUBCUTANEOUS

## 2023-12-30 MED ORDER — DIAZEPAM 5 MG/ML IJ SOLN
2.5000 mg | Freq: Once | INTRAMUSCULAR | Status: AC
  Administered 2023-12-30: 2.5 mg via INTRAVENOUS
  Filled 2023-12-30: qty 2

## 2023-12-30 MED ORDER — MORPHINE SULFATE (PF) 4 MG/ML IV SOLN
4.0000 mg | Freq: Once | INTRAVENOUS | Status: AC
  Administered 2023-12-30: 4 mg via INTRAVENOUS
  Filled 2023-12-30: qty 1

## 2023-12-30 MED ORDER — POLYETHYLENE GLYCOL 3350 17 G PO PACK
17.0000 g | PACK | Freq: Every day | ORAL | Status: DC
  Administered 2023-12-31 – 2024-01-01 (×2): 17 g via ORAL
  Filled 2023-12-30 (×2): qty 1

## 2023-12-30 MED ORDER — GUAIFENESIN ER 600 MG PO TB12: 600.0000 mg | ORAL_TABLET | Freq: Two times a day (BID) | ORAL | Status: DC | PRN

## 2023-12-30 MED ORDER — ISOSORBIDE MONONITRATE ER 30 MG PO TB24
30.0000 mg | ORAL_TABLET | Freq: Every day | ORAL | Status: DC
Start: 2023-12-31 — End: 2024-01-04
  Administered 2023-12-31 – 2024-01-04 (×5): 30 mg via ORAL
  Filled 2023-12-30 (×5): qty 1

## 2023-12-30 MED ORDER — ONDANSETRON HCL 4 MG/2ML IJ SOLN
4.0000 mg | Freq: Once | INTRAMUSCULAR | Status: AC
  Administered 2023-12-30: 4 mg via INTRAVENOUS
  Filled 2023-12-30: qty 2

## 2023-12-30 MED ORDER — INSULIN GLARGINE 100 UNIT/ML ~~LOC~~ SOLN
18.0000 [IU] | SUBCUTANEOUS | Status: DC
  Filled 2023-12-30: qty 0.18

## 2023-12-30 MED ORDER — OXYCODONE HCL 5 MG PO TABS
2.5000 mg | ORAL_TABLET | Freq: Four times a day (QID) | ORAL | Status: DC | PRN
  Administered 2023-12-30 – 2024-01-01 (×5): 5 mg via ORAL
  Filled 2023-12-30 (×6): qty 1

## 2023-12-30 MED ORDER — TORSEMIDE 20 MG PO TABS
40.0000 mg | ORAL_TABLET | Freq: Every day | ORAL | Status: DC
  Administered 2023-12-31 – 2024-01-04 (×5): 40 mg via ORAL
  Filled 2023-12-30 (×5): qty 2
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
## 2023-12-30 NOTE — Assessment & Plan Note (Signed)
>>  ASSESSMENT AND PLAN FOR T2DM (TYPE 2 DIABETES MELLITUS) (HCC) WRITTEN ON 12/30/2023  9:46 PM BY LAFE DOMINO, DO  Well-controlled sugars at last PCP visit in August. - continue home regimen basaglar  18 units daily (or formulary equivalent) - moderate SSI - q4h CBGs overnight given unclear food intake and medication regimen today; anticipate this can be adjusted tomorrow - hold home metformin  while in hospital
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
## 2023-12-30 NOTE — ED Notes (Signed)
 Patient currently at MRI.
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
## 2023-12-30 NOTE — Assessment & Plan Note (Signed)
 Well-controlled sugars at last PCP visit in August. - continue home regimen basaglar  18 units daily (or formulary equivalent) - moderate SSI - q4h CBGs overnight given unclear food intake and medication regimen today; anticipate this can be adjusted tomorrow - hold home metformin  while in hospital

## 2023-12-30 NOTE — Assessment & Plan Note (Addendum)
 HLD: continue home atorvastatin  80 mg daily HTN: continue home regimen Imdur  30 mg daily, Coreg  12.5 mg BID, hydralazine  50 mg TID, potassium 20 mEq daily CAD: hold home regimen ASA 81 mg daily and Plavix  75 mg daily as pt is on Heparin  CHF: last Echo 09/30/23 with EF 25-30%. Continue home Jardiance  10 mg daily, torsemide  40 mg daily.

## 2023-12-30 NOTE — ED Notes (Signed)
 [facility] notified that the patient is on his way to the floor .
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
## 2023-12-30 NOTE — Progress Notes (Signed)
 Transition of Care [facility]) - CAGE-AID Screening   Patient Details  Name: Antino Mayabb MRN: [id number] Date of Birth: 1950/06/03  Transition of Care [facility]) CM/SW Contact:    Bernardino Mayotte, RN Phone Number: 12/30/2023, 8:56 PM   Clinical Narrative:  Denies use of alcohol and illicit substances. Resources not given at this time.  CAGE-AID Screening:    Have You Ever Felt You Ought to Cut Down on Your Drinking or Drug Use?: No Have People Annoyed You By Critizing Your Drinking Or Drug Use?: No Have You Felt Bad Or Guilty About Your Drinking Or Drug Use?: No Have You Ever Had a Drink or Used Drugs First Thing In The Morning to Steady Your Nerves or to Get Rid of a Hangover?: No CAGE-AID Score: 0  Substance Abuse Education Offered: No
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
## 2023-12-30 NOTE — Assessment & Plan Note (Signed)
 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 - AM BMP - avoid nephrotoxic agents as able

## 2023-12-30 NOTE — Assessment & Plan Note (Signed)
 Seen on imaging, L1 affected. - appreciate NSGY assessment of patient - BED REST; DO NOT MOBILIZE AT THIS TIME - pain control as above

## 2023-12-30 NOTE — Assessment & Plan Note (Signed)
 Right posterior ribs 1-3. - encourage incentive spirometry and pulmonary toileting - mucinex  600 mg BID PRN to loosen phlegm - pain control as above

## 2023-12-30 NOTE — Assessment & Plan Note (Signed)
>>  ASSESSMENT AND PLAN FOR CKD (CHRONIC KIDNEY DISEASE), STAGE III (HCC) WRITTEN ON 12/30/2023  9:46 PM BY SPENCE, SARAH, DO  Admission creatinine 2.57; new baseline seems to be around 2.5 since AKI earlier this year post-CABG. - AM BMP - avoid nephrotoxic agents as able
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
## 2023-12-31 DIAGNOSIS — S32001A Stable burst fracture of unspecified lumbar vertebra, initial encounter for closed fracture: Secondary | ICD-10-CM | POA: Diagnosis not present

## 2023-12-31 DIAGNOSIS — R404 Transient alteration of awareness: Secondary | ICD-10-CM | POA: Insufficient documentation

## 2023-12-31 LAB — GLUCOSE, CAPILLARY
Glucose-Capillary: 153 mg/dL — ABNORMAL HIGH (ref 70–99)
Glucose-Capillary: 231 mg/dL — ABNORMAL HIGH (ref 70–99)
Glucose-Capillary: 184 mg/dL — ABNORMAL HIGH (ref 70–99)
Glucose-Capillary: 176 mg/dL — ABNORMAL HIGH (ref 70–99)

## 2023-12-31 LAB — TROPONIN I (HIGH SENSITIVITY)
Troponin I (High Sensitivity): 95 ng/L — ABNORMAL HIGH (ref <18)
Troponin I (High Sensitivity): 99 ng/L — ABNORMAL HIGH (ref <18)
Troponin I (High Sensitivity): 100 ng/L (ref <18)
Troponin I (High Sensitivity): 102 ng/L (ref <18)
Troponin I (High Sensitivity): 93 ng/L — ABNORMAL HIGH (ref <18)

## 2023-12-31 LAB — BASIC METABOLIC PANEL WITH GFR
Anion gap: 10 (ref 5–15)
BUN: 28 mg/dL — ABNORMAL HIGH (ref 8–23)
CO2: 26 mmol/L (ref 22–32)
Calcium: 9.2 mg/dL (ref 8.9–10.3)
Chloride: 105 mmol/L (ref 98–111)
Creatinine, Ser: 2.24 mg/dL — ABNORMAL HIGH (ref 0.61–1.24)
GFR, Estimated: 30 mL/min — ABNORMAL LOW (ref >60)
Glucose, Bld: 174 mg/dL — ABNORMAL HIGH (ref 70–99)
Potassium: 3.8 mmol/L (ref 3.5–5.1)
Sodium: 141 mmol/L (ref 135–145)

## 2023-12-31 LAB — CBC
HCT: 35.6 % — ABNORMAL LOW (ref 39.0–52.0)
Hemoglobin: 11.3 g/dL — ABNORMAL LOW (ref 13.0–17.0)
MCH: 28.5 pg (ref 26.0–34.0)
MCHC: 31.7 g/dL (ref 30.0–36.0)
MCV: 89.7 fL (ref 80.0–100.0)
Platelets: 303 K/uL (ref 150–400)
RBC: 3.97 MIL/uL — ABNORMAL LOW (ref 4.22–5.81)
RDW: 14.2 % (ref 11.5–15.5)
WBC: 9.6 K/uL (ref 4.0–10.5)
nRBC: 0 % (ref 0.0–0.2)

## 2023-12-31 MED ORDER — ONDANSETRON 4 MG PO TBDP
8.0000 mg | ORAL_TABLET | Freq: Three times a day (TID) | ORAL | Status: DC | PRN
Start: 2023-12-31 — End: 2023-12-31

## 2023-12-31 MED ORDER — ASPIRIN 81 MG PO TBEC
81.0000 mg | DELAYED_RELEASE_TABLET | Freq: Every day | ORAL | Status: DC
  Administered 2023-12-31 – 2024-01-04 (×5): 81 mg via ORAL
  Filled 2023-12-31 (×5): qty 1

## 2023-12-31 MED ORDER — CLOPIDOGREL BISULFATE 75 MG PO TABS
75.0000 mg | ORAL_TABLET | Freq: Every day | ORAL | Status: DC
Start: 2023-12-31 — End: 2024-01-04
  Administered 2023-12-31 – 2024-01-04 (×5): 75 mg via ORAL
  Filled 2023-12-31 (×5): qty 1

## 2023-12-31 MED ORDER — ONDANSETRON HCL 4 MG/2ML IJ SOLN: 4.0000 mg | Freq: Three times a day (TID) | INTRAMUSCULAR | Status: DC | PRN

## 2023-12-31 MED ORDER — ONDANSETRON 4 MG PO TBDP: 4.0000 mg | ORAL_TABLET | Freq: Three times a day (TID) | ORAL | Status: DC | PRN

## 2023-12-31 MED ORDER — METHOCARBAMOL 500 MG PO TABS
500.0000 mg | ORAL_TABLET | Freq: Three times a day (TID) | ORAL | Status: DC | PRN
  Administered 2023-12-31 – 2024-01-01 (×2): 500 mg via ORAL
  Filled 2023-12-31 (×2): qty 1

## 2023-12-31 NOTE — Assessment & Plan Note (Signed)
 HLD: continue home atorvastatin  80 mg daily HTN: continue home regimen Imdur  30 mg daily, Coreg  12.5 mg BID, hydralazine  50 mg TID, potassium 20 mEq daily CAD: continue home regimen ASA 81 mg daily and Plavix  75 mg daily CHF: last Echo 09/30/23 with EF 25-30%. Continue home Jardiance  10 mg daily, torsemide  40 mg daily.

## 2023-12-31 NOTE — Assessment & Plan Note (Signed)
 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 - AM BMP - avoid nephrotoxic agents as able
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
## 2023-12-31 NOTE — Progress Notes (Signed)
 Date and time results received: 12/31/23   Test: troponin  Critical Value: 100  Name of Provider Notified: Dr Elna Redo   Orders Received new order received
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
## 2023-12-31 NOTE — Progress Notes (Signed)
 Orthopedic Tech Progress Note Patient Details:  [name] May 13, 1950 [id number]  Ortho Devices Type of Ortho Device: Thoracolumbar corset (TLSO) Ortho Device/Splint Interventions: Ordered, Adjustment   Post Interventions Instructions Provided: Care of device, Adjustment of device  [name] 12/31/2023, 12:23 PM
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
## 2023-12-31 NOTE — Progress Notes (Signed)
 Patient ID: [name], male   DOB: Sep 23, 1950, 73 y.o.   MRN: [id number] I have seen and formally examined the patient today in regards to his motor strength his leg strength appears good reflexes are diminished in the patella and absent in the Achilles.  There 1+ in the biceps 1+ in the triceps also.  His motor strength appears good.  I discussed the nature of the fracture with the patient.  Considering all that he has been through medically with his open heart surgery he would be a substantial risk for significant surgical intervention.  Discussed mobilization in a TLSO.  We can write some methocarbamol  for spasms.  Will continue to follow conservatively.

## 2023-12-31 NOTE — Assessment & Plan Note (Signed)
>>  ASSESSMENT AND PLAN FOR CKD (CHRONIC KIDNEY DISEASE), STAGE III (HCC) WRITTEN ON 12/31/2023  8:22 AM BY MABE, GERALD, MD  Admission creatinine 2.57; new baseline seems to be around 2.5 since AKI earlier this year post-CABG. - AM BMP - avoid nephrotoxic agents as able

## 2023-12-31 NOTE — Assessment & Plan Note (Signed)
>>  ASSESSMENT AND PLAN FOR T2DM (TYPE 2 DIABETES MELLITUS) (HCC) WRITTEN ON 12/31/2023  8:22 AM BY MABE, GERALD, MD  Well-controlled sugars at last PCP visit in August. - moderate SSI for now, can add long acting insulin  (18 units at home) as patient starts to eat again - hold home metformin

## 2023-12-31 NOTE — Progress Notes (Signed)
 Patient noted to have intermittent jerking/spasms. Dr. Colon and Dr. Dasie made aware new order received

## 2023-12-31 NOTE — Assessment & Plan Note (Signed)
 Well-controlled sugars at last PCP visit in August. - moderate SSI for now, can add long acting insulin  (18 units at home) as patient starts to eat again - hold home metformin 
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
## 2024-01-01 DIAGNOSIS — S32001A Stable burst fracture of unspecified lumbar vertebra, initial encounter for closed fracture: Secondary | ICD-10-CM | POA: Diagnosis not present

## 2024-01-01 LAB — BASIC METABOLIC PANEL WITH GFR
Anion gap: 16 — ABNORMAL HIGH (ref 5–15)
BUN: 25 mg/dL — ABNORMAL HIGH (ref 8–23)
CO2: 24 mmol/L (ref 22–32)
Calcium: 9.3 mg/dL (ref 8.9–10.3)
Chloride: 102 mmol/L (ref 98–111)
Creatinine, Ser: 2.29 mg/dL — ABNORMAL HIGH (ref 0.61–1.24)
GFR, Estimated: 29 mL/min — ABNORMAL LOW (ref >60)
Glucose, Bld: 139 mg/dL — ABNORMAL HIGH (ref 70–99)
Potassium: 4.1 mmol/L (ref 3.5–5.1)
Sodium: 142 mmol/L (ref 135–145)

## 2024-01-01 LAB — CBC WITH DIFFERENTIAL/PLATELET
Abs Immature Granulocytes: 0.04 K/uL (ref 0.00–0.07)
Basophils Absolute: 0.1 K/uL (ref 0.0–0.1)
Basophils Relative: 1 %
Eosinophils Absolute: 0.2 K/uL (ref 0.0–0.5)
Eosinophils Relative: 3 %
HCT: 34.3 % — ABNORMAL LOW (ref 39.0–52.0)
Hemoglobin: 11 g/dL — ABNORMAL LOW (ref 13.0–17.0)
Immature Granulocytes: 1 %
Lymphocytes Relative: 22 %
Lymphs Abs: 1.9 K/uL (ref 0.7–4.0)
MCH: 28.7 pg (ref 26.0–34.0)
MCHC: 32.1 g/dL (ref 30.0–36.0)
MCV: 89.6 fL (ref 80.0–100.0)
Monocytes Absolute: 0.6 K/uL (ref 0.1–1.0)
Monocytes Relative: 7 %
Neutro Abs: 5.7 K/uL (ref 1.7–7.7)
Neutrophils Relative %: 66 %
Platelets: 273 K/uL (ref 150–400)
RBC: 3.83 MIL/uL — ABNORMAL LOW (ref 4.22–5.81)
RDW: 14.5 % (ref 11.5–15.5)
WBC: 8.5 K/uL (ref 4.0–10.5)
nRBC: 0 % (ref 0.0–0.2)

## 2024-01-01 LAB — GLUCOSE, CAPILLARY
Glucose-Capillary: 235 mg/dL — ABNORMAL HIGH (ref 70–99)
Glucose-Capillary: 246 mg/dL — ABNORMAL HIGH (ref 70–99)
Glucose-Capillary: 186 mg/dL — ABNORMAL HIGH (ref 70–99)
Glucose-Capillary: 179 mg/dL — ABNORMAL HIGH (ref 70–99)

## 2024-01-01 MED ORDER — ENSURE PLUS HIGH PROTEIN PO LIQD
237.0000 mL | Freq: Two times a day (BID) | ORAL | Status: DC
  Administered 2024-01-02: 237 mL via ORAL

## 2024-01-01 MED ORDER — INSULIN GLARGINE 100 UNIT/ML ~~LOC~~ SOLN
18.0000 [IU] | Freq: Every day | SUBCUTANEOUS | Status: DC
  Administered 2024-01-01 – 2024-01-04 (×4): 18 [IU] via SUBCUTANEOUS
  Filled 2024-01-01 (×4): qty 0.18

## 2024-01-01 MED ORDER — SENNA 8.6 MG PO TABS
2.0000 | ORAL_TABLET | Freq: Once | ORAL | Status: AC
  Administered 2024-01-01: 17.2 mg via ORAL
  Filled 2024-01-01: qty 2

## 2024-01-01 MED ORDER — BISACODYL 5 MG PO TBEC
10.0000 mg | DELAYED_RELEASE_TABLET | Freq: Once | ORAL | Status: AC
  Administered 2024-01-01: 10 mg via ORAL
  Filled 2024-01-01: qty 2

## 2024-01-01 MED ORDER — OXYCODONE HCL 5 MG PO TABS
5.0000 mg | ORAL_TABLET | ORAL | Status: DC | PRN
  Administered 2024-01-01 – 2024-01-02 (×2): 5 mg via ORAL
  Filled 2024-01-01: qty 2
  Filled 2024-01-01 (×2): qty 1

## 2024-01-01 MED ORDER — LIDOCAINE 5 % EX PTCH
2.0000 | MEDICATED_PATCH | CUTANEOUS | Status: DC
  Administered 2024-01-01 – 2024-01-04 (×4): 2 via TRANSDERMAL
  Filled 2024-01-01 (×4): qty 2

## 2024-01-01 MED ORDER — ACETAMINOPHEN 500 MG PO TABS
1000.0000 mg | ORAL_TABLET | Freq: Four times a day (QID) | ORAL | Status: DC
  Administered 2024-01-01 – 2024-01-04 (×12): 1000 mg via ORAL
  Filled 2024-01-01 (×13): qty 2

## 2024-01-01 MED ORDER — OXYCODONE HCL 5 MG PO TABS: 5.0000 mg | ORAL_TABLET | Freq: Four times a day (QID) | ORAL | Status: DC | PRN

## 2024-01-01 MED ORDER — POLYETHYLENE GLYCOL 3350 17 G PO PACK
17.0000 g | PACK | Freq: Two times a day (BID) | ORAL | Status: DC
  Administered 2024-01-01 – 2024-01-04 (×6): 17 g via ORAL
  Filled 2024-01-01 (×6): qty 1

## 2024-01-01 MED ORDER — METHOCARBAMOL 750 MG PO TABS
750.0000 mg | ORAL_TABLET | Freq: Four times a day (QID) | ORAL | Status: DC
  Administered 2024-01-01 – 2024-01-04 (×13): 750 mg via ORAL
  Filled 2024-01-01 (×13): qty 1
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
## 2024-01-01 NOTE — Assessment & Plan Note (Signed)
>>  ASSESSMENT AND PLAN FOR CKD (CHRONIC KIDNEY DISEASE), STAGE III (HCC) WRITTEN ON 01/01/2024  8:16 AM BY STEVENS, ASHLEY SAILOR, MD  Admission creatinine 2.57; Cr 2.29 today; new baseline seems to be around 2.5 since AKI earlier this year post-CABG. - AM BMP - Avoid nephrotoxic agents as able
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
## 2024-01-01 NOTE — Assessment & Plan Note (Signed)
 Admission creatinine 2.57; Cr 2.29 today; new baseline seems to be around 2.5 since AKI earlier this year post-CABG. - AM BMP - Avoid nephrotoxic agents as able

## 2024-01-01 NOTE — Assessment & Plan Note (Signed)
 HLD: continue home atorvastatin  80 mg daily HTN: continue home regimen Imdur  30 mg daily, Coreg  12.5 mg BID, hydralazine  50 mg TID, potassium 20 mEq daily CAD: continue home regimen ASA 81 mg daily and Plavix  75 mg daily CHF: last Echo 09/30/23 with EF 25-30%. Continue home Jardiance  10 mg daily, torsemide  40 mg daily.
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
## 2024-01-01 NOTE — Plan of Care (Signed)
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
## 2024-01-01 NOTE — TOC Progression Note (Signed)
 Transition of Care [facility]) - Progression Note    Patient Details  Name: [name] MRN: [id number] Date of Birth: Dec 26, 1950  [facility]) CM/SW Contact  Bilbo Carcamo E Beren Yniguez, LCSW Phone Number: 01/01/2024, 3:26 PM  Clinical Narrative:    Presented bed offers [facility]; [facility] & [facility] are considering). Patient's wife states she does not want patient to go to [place], prefers he stay local to [place]. She agreed to research Medicare ratings for the other 3 facilities and will have a choice made by tomorrow.    Expected Discharge Plan: Skilled Nursing Facility Barriers to Discharge: Continued Medical Work up               Expected Discharge Plan and Services       Living arrangements for the past 2 months: Single Family Home                                       Social Drivers of Health (SDOH) Interventions SDOH Screenings   Food Insecurity: No Food Insecurity (12/31/2023)  Recent Concern: Food Insecurity - Food Insecurity Present (10/31/2023)  Housing: Unknown (12/31/2023)  Transportation Needs: No Transportation Needs (12/31/2023)  Utilities: Not At Risk (12/31/2023)  Alcohol Screen: Low Risk  (08/21/2023)  Depression (PHQ2-9): Low Risk  (12/19/2023)  Financial Resource Strain: Medium Risk (10/31/2023)  Physical Activity: Inactive (10/31/2023)  Social Connections: Moderately Isolated (12/31/2023)  Stress: No Stress Concern Present (10/31/2023)  Tobacco Use: Medium Risk (12/30/2023)  Health Literacy: Adequate Health Literacy (10/17/2023)    Readmission Risk Interventions     No data to display

## 2024-01-01 NOTE — Assessment & Plan Note (Signed)
>>  ASSESSMENT AND PLAN FOR T2DM (TYPE 2 DIABETES MELLITUS) (HCC) WRITTEN ON 01/01/2024  1:50 PM BY MANNIE ASHLEY SAILOR, MD  Well-controlled sugars at last PCP visit in August. - Continue SSI + restarted home long acting insulin  (18 units) - hold home metformin

## 2024-01-01 NOTE — Progress Notes (Signed)
 Patient ID: [name], male   DOB: [birth year], 73 y.o.   MRN: [id number] Mr. Kuster appears resting comfortably this evening he did ambulate some notes that his back hurts but the pain seems to be improving somewhat I believe we should continues conservative course and see how he does with the next few days of ambulation.

## 2024-01-01 NOTE — Assessment & Plan Note (Addendum)
 Well-controlled sugars at last PCP visit in August. - Continue SSI + restarted home long acting insulin  (18 units) - hold home metformin 
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
## 2024-01-02 DIAGNOSIS — S32001A Stable burst fracture of unspecified lumbar vertebra, initial encounter for closed fracture: Secondary | ICD-10-CM | POA: Diagnosis not present

## 2024-01-02 LAB — BASIC METABOLIC PANEL WITH GFR
Anion gap: 13 (ref 5–15)
BUN: 27 mg/dL — ABNORMAL HIGH (ref 8–23)
CO2: 27 mmol/L (ref 22–32)
Calcium: 9.6 mg/dL (ref 8.9–10.3)
Chloride: 102 mmol/L (ref 98–111)
Creatinine, Ser: 2.31 mg/dL — ABNORMAL HIGH (ref 0.61–1.24)
GFR, Estimated: 29 mL/min — ABNORMAL LOW (ref >60)
Glucose, Bld: 144 mg/dL — ABNORMAL HIGH (ref 70–99)
Potassium: 4 mmol/L (ref 3.5–5.1)
Sodium: 142 mmol/L (ref 135–145)

## 2024-01-02 LAB — CBC
HCT: 36.7 % — ABNORMAL LOW (ref 39.0–52.0)
Hemoglobin: 11.7 g/dL — ABNORMAL LOW (ref 13.0–17.0)
MCH: 28.6 pg (ref 26.0–34.0)
MCHC: 31.9 g/dL (ref 30.0–36.0)
MCV: 89.7 fL (ref 80.0–100.0)
Platelets: 307 K/uL (ref 150–400)
RBC: 4.09 MIL/uL — ABNORMAL LOW (ref 4.22–5.81)
RDW: 14.5 % (ref 11.5–15.5)
WBC: 9.5 K/uL (ref 4.0–10.5)
nRBC: 0 % (ref 0.0–0.2)

## 2024-01-02 LAB — GLUCOSE, CAPILLARY
Glucose-Capillary: 160 mg/dL — ABNORMAL HIGH (ref 70–99)
Glucose-Capillary: 192 mg/dL — ABNORMAL HIGH (ref 70–99)
Glucose-Capillary: 228 mg/dL — ABNORMAL HIGH (ref 70–99)
Glucose-Capillary: 139 mg/dL — ABNORMAL HIGH (ref 70–99)
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
## 2024-01-02 NOTE — Assessment & Plan Note (Signed)
>>  ASSESSMENT AND PLAN FOR CKD (CHRONIC KIDNEY DISEASE), STAGE III (HCC) WRITTEN ON 01/02/2024 11:22 AM BY STEVENS, ASHLEY SAILOR, MD  Admission creatinine 2.57; Cr stable over last several days around 2.2-2.3; new baseline seems to be around 2.5 since AKI earlier this year post-CABG. - Avoid nephrotoxic agents as able

## 2024-01-02 NOTE — Assessment & Plan Note (Signed)
 HLD: continue home atorvastatin  80 mg daily HTN: continue home regimen Imdur  30 mg daily, Coreg  12.5 mg BID, hydralazine  50 mg TID, potassium 20 mEq daily CAD: continue home regimen ASA 81 mg daily and Plavix  75 mg daily CHF: last Echo 09/30/23 with EF 25-30%. Continue home Jardiance  10 mg daily, torsemide  40 mg daily.

## 2024-01-02 NOTE — TOC Progression Note (Signed)
 Transition of Care [facility]) - Progression Note    Patient Details  Name: [name] MRN: [id number] Date of Birth: 1950-10-28  [facility]) CM/SW Contact  [name], [place] Phone Number: 01/02/2024, 3:39 PM  Clinical Narrative:     Received insurance authorization for SNF from  9/9 to  9/11 ID# [id number]   Informed [facility]- anticipate d/c tomorrow   Montie Louder, MSW, LCSW Clinical Social Worker    Expected Discharge Plan: Skilled Nursing Facility Barriers to Discharge: Insurance Authorization               Expected Discharge Plan and Services       Living arrangements for the past 2 months: Single Family Home                                       Social Drivers of Health (SDOH) Interventions SDOH Screenings   Food Insecurity: No Food Insecurity (12/31/2023)  Recent Concern: Food Insecurity - Food Insecurity Present (10/31/2023)  Housing: Unknown (12/31/2023)  Transportation Needs: No Transportation Needs (12/31/2023)  Utilities: Not At Risk (12/31/2023)  Alcohol Screen: Low Risk  (08/21/2023)  Depression (PHQ2-9): Low Risk  (12/19/2023)  Financial Resource Strain: Medium Risk (10/31/2023)  Physical Activity: Inactive (10/31/2023)  Social Connections: Moderately Isolated (12/31/2023)  Stress: No Stress Concern Present (10/31/2023)  Tobacco Use: Medium Risk (12/30/2023)  Health Literacy: Adequate Health Literacy (10/17/2023)    Readmission Risk Interventions     No data to display

## 2024-01-02 NOTE — Assessment & Plan Note (Addendum)
 Well-controlled sugars at last PCP visit in August. - Continue SSI + long acting insulin  (18 units) - Hold home metformin 
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
## 2024-01-02 NOTE — Care Management Important Message (Signed)
 Important Message  Patient Details  Name: Krithik Mapel MRN: [id number] Date of Birth: 1950/06/09   Important Message Given:  Yes - Medicare IM     Jon Cruel 01/02/2024, 3:47 PM

## 2024-01-02 NOTE — Assessment & Plan Note (Signed)
>>  ASSESSMENT AND PLAN FOR T2DM (TYPE 2 DIABETES MELLITUS) (HCC) WRITTEN ON 01/02/2024  9:24 AM BY MANNIE ASHLEY SAILOR, MD  Well-controlled sugars at last PCP visit in August. - Continue SSI + long acting insulin  (18 units) - Hold home metformin

## 2024-01-02 NOTE — Plan of Care (Signed)
   Problem: Education: Goal: Knowledge of General Education information will improve Description Including pain rating scale, medication(s)/side effects and non-pharmacologic comfort measures Outcome: Progressing   Problem: Health Behavior/Discharge Planning: Goal: Ability to manage health-related needs will improve Outcome: Progressing

## 2024-01-02 NOTE — TOC Progression Note (Signed)
 Transition of Care [facility]) - Progression Note    Patient Details  Name: [name] MRN: [id number] Date of Birth: 1950-11-19  Transition of [facility]) CM/SW Contact  [name], [place] Phone Number: 01/02/2024, 12:14 PM  Clinical Narrative:     CSW spoke with patient's spouse,[name]- she chose [facility] for rehab.  [facility] confirmed availability- TOC will start auth today.  Montie Louder, MSW, LCSW Clinical Social Worker    Expected Discharge Plan: Skilled Nursing Facility Barriers to Discharge: Insurance Authorization               Expected Discharge Plan and Services       Living arrangements for the past 2 months: Single Family Home                                       Social Drivers of Health (SDOH) Interventions SDOH Screenings   Food Insecurity: No Food Insecurity (12/31/2023)  Recent Concern: Food Insecurity - Food Insecurity Present (10/31/2023)  Housing: Unknown (12/31/2023)  Transportation Needs: No Transportation Needs (12/31/2023)  Utilities: Not At Risk (12/31/2023)  Alcohol Screen: Low Risk  (08/21/2023)  Depression (PHQ2-9): Low Risk  (12/19/2023)  Financial Resource Strain: Medium Risk (10/31/2023)  Physical Activity: Inactive (10/31/2023)  Social Connections: Moderately Isolated (12/31/2023)  Stress: No Stress Concern Present (10/31/2023)  Tobacco Use: Medium Risk (12/30/2023)  Health Literacy: Adequate Health Literacy (10/17/2023)    Readmission Risk Interventions     No data to display
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
## 2024-01-02 NOTE — Assessment & Plan Note (Addendum)
 Admission creatinine 2.57; Cr stable over last several days around 2.2-2.3; new baseline seems to be around 2.5 since AKI earlier this year post-CABG. - Avoid nephrotoxic agents as able

## 2024-01-03 DIAGNOSIS — S32001A Stable burst fracture of unspecified lumbar vertebra, initial encounter for closed fracture: Secondary | ICD-10-CM | POA: Diagnosis not present

## 2024-01-03 DIAGNOSIS — R079 Chest pain, unspecified: Secondary | ICD-10-CM | POA: Insufficient documentation

## 2024-01-03 LAB — GLUCOSE, CAPILLARY
Glucose-Capillary: 118 mg/dL — ABNORMAL HIGH (ref 70–99)
Glucose-Capillary: 216 mg/dL — ABNORMAL HIGH (ref 70–99)
Glucose-Capillary: 157 mg/dL — ABNORMAL HIGH (ref 70–99)
Glucose-Capillary: 134 mg/dL — ABNORMAL HIGH (ref 70–99)

## 2024-01-03 LAB — TROPONIN I (HIGH SENSITIVITY): Troponin I (High Sensitivity): 46 ng/L — ABNORMAL HIGH (ref <18)

## 2024-01-03 MED ORDER — POLYETHYLENE GLYCOL 3350 17 G PO PACK: 17.0000 g | PACK | Freq: Two times a day (BID) | ORAL | Status: DC

## 2024-01-03 MED ORDER — ENSURE PLUS HIGH PROTEIN PO LIQD: 237.0000 mL | Freq: Two times a day (BID) | ORAL | Status: AC

## 2024-01-03 MED ORDER — GUAIFENESIN ER 600 MG PO TB12: 600.0000 mg | ORAL_TABLET | Freq: Two times a day (BID) | ORAL | Status: DC | PRN

## 2024-01-03 MED ORDER — OXYCODONE HCL 5 MG PO TABS: 5.0000 mg | ORAL_TABLET | ORAL | 0 refills | Status: DC | PRN

## 2024-01-03 MED ORDER — ONDANSETRON 4 MG PO TBDP: 4.0000 mg | ORAL_TABLET | Freq: Three times a day (TID) | ORAL | Status: AC | PRN
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
## 2024-01-03 NOTE — Plan of Care (Signed)

## 2024-01-03 NOTE — Plan of Care (Signed)
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
## 2024-01-03 NOTE — Assessment & Plan Note (Addendum)
 HLD: continue home atorvastatin  80 mg daily HTN: continue home regimen Imdur  30 mg daily, Coreg  12.5 mg BID, hydralazine  50 mg TID, potassium 20 mEq daily CAD: continue home regimen ASA 81 mg daily and Plavix  75 mg daily CHF: last Echo 09/30/23 with EF 25-30%. Continue home Jardiance  10 mg daily, torsemide  40 mg daily.
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
## 2024-01-03 NOTE — Assessment & Plan Note (Signed)
>>  ASSESSMENT AND PLAN FOR T2DM (TYPE 2 DIABETES MELLITUS) (HCC) WRITTEN ON 01/03/2024  8:49 AM BY MANNIE ASHLEY SAILOR, MD  Well-controlled sugars at last PCP visit in August. - Continue SSI + long acting insulin  (18 units) - Hold home metformin

## 2024-01-03 NOTE — Assessment & Plan Note (Signed)
>>  ASSESSMENT AND PLAN FOR CKD (CHRONIC KIDNEY DISEASE), STAGE III (HCC) WRITTEN ON 01/03/2024  8:49 AM BY STEVENS, ASHLEY SAILOR, MD  Admission creatinine 2.57; Cr stable over last several days around 2.2-2.3; new baseline seems to be around 2.5 since AKI earlier this year post-CABG. - Avoid nephrotoxic agents as able

## 2024-01-03 NOTE — Assessment & Plan Note (Signed)
 Well-controlled sugars at last PCP visit in August. - Continue SSI + long acting insulin  (18 units) - Hold home metformin 

## 2024-01-03 NOTE — Assessment & Plan Note (Signed)
 Patient reporting mild chest pain at rest. R/o ACS. - EKG today negative for acute changes - STAT troponin

## 2024-01-03 NOTE — Assessment & Plan Note (Signed)
 Admission creatinine 2.57; Cr stable over last several days around 2.2-2.3; new baseline seems to be around 2.5 since AKI earlier this year post-CABG. - Avoid nephrotoxic agents as able

## 2024-01-03 NOTE — Progress Notes (Signed)
 Patient ID: [name], male   DOB: 1951-02-16, 73 y.o.   MRN: [id number] Patient is awake alert reasonably comfortable in TLSO sitting up in chair.  Does note pain into the center of the low back.  This is consistent with his L1 fracture.  He can continue his mobilization as tolerated.  I need to follow this patient in about 2 weeks time as an outpatient with an AP and lateral lumbar spine film.  My office can arrange this.  Will sign off at this time.

## 2024-01-04 DIAGNOSIS — S32001D Stable burst fracture of unspecified lumbar vertebra, subsequent encounter for fracture with routine healing: Secondary | ICD-10-CM | POA: Diagnosis not present

## 2024-01-04 DIAGNOSIS — Z550 Illiteracy and low-level literacy: Secondary | ICD-10-CM | POA: Diagnosis not present

## 2024-01-04 DIAGNOSIS — Z794 Long term (current) use of insulin: Secondary | ICD-10-CM | POA: Diagnosis not present

## 2024-01-04 DIAGNOSIS — S32001A Stable burst fracture of unspecified lumbar vertebra, initial encounter for closed fracture: Secondary | ICD-10-CM | POA: Diagnosis not present

## 2024-01-04 DIAGNOSIS — R531 Weakness: Secondary | ICD-10-CM | POA: Diagnosis not present

## 2024-01-04 DIAGNOSIS — K5900 Constipation, unspecified: Secondary | ICD-10-CM | POA: Diagnosis not present

## 2024-01-04 DIAGNOSIS — R2689 Other abnormalities of gait and mobility: Secondary | ICD-10-CM | POA: Diagnosis not present

## 2024-01-04 DIAGNOSIS — F4321 Adjustment disorder with depressed mood: Secondary | ICD-10-CM | POA: Diagnosis not present

## 2024-01-04 DIAGNOSIS — S2249XD Multiple fractures of ribs, unspecified side, subsequent encounter for fracture with routine healing: Secondary | ICD-10-CM | POA: Diagnosis not present

## 2024-01-04 DIAGNOSIS — I5043 Acute on chronic combined systolic (congestive) and diastolic (congestive) heart failure: Secondary | ICD-10-CM | POA: Diagnosis not present

## 2024-01-04 DIAGNOSIS — Z7401 Bed confinement status: Secondary | ICD-10-CM | POA: Diagnosis not present

## 2024-01-04 DIAGNOSIS — I249 Acute ischemic heart disease, unspecified: Secondary | ICD-10-CM | POA: Diagnosis not present

## 2024-01-04 DIAGNOSIS — R52 Pain, unspecified: Secondary | ICD-10-CM | POA: Diagnosis not present

## 2024-01-04 DIAGNOSIS — N183 Chronic kidney disease, stage 3 unspecified: Secondary | ICD-10-CM | POA: Diagnosis not present

## 2024-01-04 DIAGNOSIS — E119 Type 2 diabetes mellitus without complications: Secondary | ICD-10-CM | POA: Diagnosis not present

## 2024-01-04 DIAGNOSIS — E1165 Type 2 diabetes mellitus with hyperglycemia: Secondary | ICD-10-CM | POA: Diagnosis not present

## 2024-01-04 DIAGNOSIS — M6281 Muscle weakness (generalized): Secondary | ICD-10-CM | POA: Diagnosis not present

## 2024-01-04 LAB — GLUCOSE, CAPILLARY
Glucose-Capillary: 138 mg/dL — ABNORMAL HIGH (ref 70–99)
Glucose-Capillary: 246 mg/dL — ABNORMAL HIGH (ref 70–99)

## 2024-01-04 LAB — TROPONIN I (HIGH SENSITIVITY): Troponin I (High Sensitivity): 42 ng/L — ABNORMAL HIGH (ref <18)

## 2024-01-04 MED ORDER — OXYCODONE HCL 5 MG PO TABS: 5.0000 mg | ORAL_TABLET | ORAL | 0 refills | Status: AC | PRN

## 2024-01-04 NOTE — Plan of Care (Signed)
 Updated wife on disposition. Answered all questions. She knows patient is at [facility] and should follow with neurosurgery in 2 weeks for follow up.

## 2024-01-04 NOTE — Progress Notes (Signed)
 Patient hand off report called into [facility]. [facility] currently picking up patient. AVS and script in discharge package. Patient has no questions at this time.  Fredie LITTIE Morones, RN
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
## 2024-01-04 NOTE — TOC Transition Note (Signed)
 Transition of Care [facility]) - Discharge Note   Patient Details  Name: [name] MRN: [id number] Date of Birth: Mar 06, 1951  [facility]) CM/SW Contact:  [name], LCSW Phone Number: 01/04/2024, 10:57 AM   Clinical Narrative:     Patient will Discharge to: [facility] Discharge Date: 01/04/2024 Family Notified: Spouse Transport By: ROME  Per MD patient is ready for discharge. RN, patient, and facility notified of discharge. Discharge Summary sent to facility. RN given number for [phone], Room 110. Ambulance transport requested for patient.   Clinical Social Worker signing off.  Montie Louder, MSW, LCSW Clinical Social Worker     Final next level of care: Skilled Nursing Facility Barriers to Discharge: Barriers Resolved   Patient Goals and CMS Choice Patient states their goals for this hospitalization and ongoing recovery are:: SNF for STR CMS Medicare.gov Compare Post Acute Care list provided to:: Patient Choice offered to / list presented to : Spouse      Discharge Placement              Patient chooses bed at:  [facility]) Patient to be transferred to facility by: [facility] Name of family member notified: Spouse Patient and family notified of of transfer: 01/04/24  Discharge Plan and Services Additional resources added to the After Visit Summary for                                       Social Drivers of Health (SDOH) Interventions SDOH Screenings   Food Insecurity: No Food Insecurity (12/31/2023)  Recent Concern: Food Insecurity - Food Insecurity Present (10/31/2023)  Housing: Unknown (12/31/2023)  Transportation Needs: No Transportation Needs (12/31/2023)  Utilities: Not At Risk (12/31/2023)  Alcohol Screen: Low Risk  (08/21/2023)  Depression (PHQ2-9): Low Risk  (12/19/2023)  Financial Resource Strain: Medium Risk (10/31/2023)  Physical Activity: Inactive (10/31/2023)  Social Connections: Moderately Isolated (12/31/2023)   Stress: No Stress Concern Present (10/31/2023)  Tobacco Use: Medium Risk (12/30/2023)  Health Literacy: Adequate Health Literacy (10/17/2023)     Readmission Risk Interventions     No data to display

## 2024-01-04 NOTE — Plan of Care (Signed)

## 2024-01-04 NOTE — Plan of Care (Signed)

## 2024-01-05 ENCOUNTER — Other Ambulatory Visit (HOSPITAL_COMMUNITY)

## 2024-01-05 ENCOUNTER — Encounter: Payer: Self-pay | Admitting: Student

## 2024-01-05 DIAGNOSIS — R52 Pain, unspecified: Secondary | ICD-10-CM | POA: Diagnosis not present

## 2024-01-05 DIAGNOSIS — N183 Chronic kidney disease, stage 3 unspecified: Secondary | ICD-10-CM | POA: Diagnosis not present

## 2024-01-05 DIAGNOSIS — M6281 Muscle weakness (generalized): Secondary | ICD-10-CM | POA: Diagnosis not present

## 2024-01-05 DIAGNOSIS — K5900 Constipation, unspecified: Secondary | ICD-10-CM | POA: Diagnosis not present

## 2024-01-05 DIAGNOSIS — S2249XD Multiple fractures of ribs, unspecified side, subsequent encounter for fracture with routine healing: Secondary | ICD-10-CM | POA: Diagnosis not present

## 2024-01-05 DIAGNOSIS — I5043 Acute on chronic combined systolic (congestive) and diastolic (congestive) heart failure: Secondary | ICD-10-CM | POA: Diagnosis not present

## 2024-01-08 ENCOUNTER — Telehealth (HOSPITAL_COMMUNITY): Payer: Self-pay

## 2024-01-08 ENCOUNTER — Ambulatory Visit: Payer: Self-pay | Admitting: Student

## 2024-01-08 DIAGNOSIS — M6281 Muscle weakness (generalized): Secondary | ICD-10-CM | POA: Diagnosis not present

## 2024-01-08 DIAGNOSIS — K5900 Constipation, unspecified: Secondary | ICD-10-CM | POA: Diagnosis not present

## 2024-01-08 DIAGNOSIS — R52 Pain, unspecified: Secondary | ICD-10-CM | POA: Diagnosis not present
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
## 2024-01-09 ENCOUNTER — Telehealth (HOSPITAL_COMMUNITY): Payer: Self-pay | Admitting: *Deleted
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
## 2024-01-10 DIAGNOSIS — N183 Chronic kidney disease, stage 3 unspecified: Secondary | ICD-10-CM | POA: Diagnosis not present

## 2024-01-10 DIAGNOSIS — K5900 Constipation, unspecified: Secondary | ICD-10-CM | POA: Diagnosis not present

## 2024-01-10 DIAGNOSIS — M6281 Muscle weakness (generalized): Secondary | ICD-10-CM | POA: Diagnosis not present

## 2024-01-10 DIAGNOSIS — I5043 Acute on chronic combined systolic (congestive) and diastolic (congestive) heart failure: Secondary | ICD-10-CM | POA: Diagnosis not present

## 2024-01-10 DIAGNOSIS — R52 Pain, unspecified: Secondary | ICD-10-CM | POA: Diagnosis not present

## 2024-01-10 DIAGNOSIS — S2249XD Multiple fractures of ribs, unspecified side, subsequent encounter for fracture with routine healing: Secondary | ICD-10-CM | POA: Diagnosis not present

## 2024-01-10 DIAGNOSIS — E1165 Type 2 diabetes mellitus with hyperglycemia: Secondary | ICD-10-CM | POA: Diagnosis not present

## 2024-01-11 ENCOUNTER — Other Ambulatory Visit (HOSPITAL_COMMUNITY): Payer: Self-pay

## 2024-01-11 ENCOUNTER — Telehealth (HOSPITAL_COMMUNITY): Payer: Self-pay
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
## 2024-01-11 NOTE — Telephone Encounter (Signed)
 Attempted f/u call regarding cardiac rehab- no answer, left message. Sent MyChart message.  Closing referral.

## 2024-01-12 ENCOUNTER — Encounter: Payer: Self-pay | Admitting: Student

## 2024-01-12 DIAGNOSIS — R5381 Other malaise: Secondary | ICD-10-CM

## 2024-01-12 DIAGNOSIS — Z951 Presence of aortocoronary bypass graft: Secondary | ICD-10-CM

## 2024-01-12 DIAGNOSIS — R079 Chest pain, unspecified: Secondary | ICD-10-CM

## 2024-01-12 DIAGNOSIS — S32001S Stable burst fracture of unspecified lumbar vertebra, sequela: Secondary | ICD-10-CM

## 2024-01-15 ENCOUNTER — Other Ambulatory Visit (HOSPITAL_COMMUNITY): Payer: Self-pay
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
## 2024-01-16 ENCOUNTER — Encounter: Payer: Self-pay | Admitting: Student

## 2024-01-16 ENCOUNTER — Ambulatory Visit: Admitting: Student

## 2024-01-16 VITALS — BP 114/72 | HR 91 | Ht 64.0 in | Wt 182.4 lb

## 2024-01-16 DIAGNOSIS — N1831 Chronic kidney disease, stage 3a: Secondary | ICD-10-CM | POA: Diagnosis not present

## 2024-01-16 DIAGNOSIS — S32001D Stable burst fracture of unspecified lumbar vertebra, subsequent encounter for fracture with routine healing: Secondary | ICD-10-CM | POA: Diagnosis not present

## 2024-01-16 DIAGNOSIS — S2249XD Multiple fractures of ribs, unspecified side, subsequent encounter for fracture with routine healing: Secondary | ICD-10-CM | POA: Diagnosis not present

## 2024-01-16 DIAGNOSIS — S2249XS Multiple fractures of ribs, unspecified side, sequela: Secondary | ICD-10-CM | POA: Diagnosis not present

## 2024-01-16 DIAGNOSIS — E1122 Type 2 diabetes mellitus with diabetic chronic kidney disease: Secondary | ICD-10-CM | POA: Diagnosis not present

## 2024-01-16 DIAGNOSIS — S32001S Stable burst fracture of unspecified lumbar vertebra, sequela: Secondary | ICD-10-CM

## 2024-01-16 MED ORDER — OXYCODONE-ACETAMINOPHEN 10-325 MG PO TABS: 1.0000 | ORAL_TABLET | Freq: Three times a day (TID) | ORAL | 0 refills | Status: AC | PRN

## 2024-01-16 NOTE — Patient Instructions (Signed)
 It was great to see you today!   I have refilled your norco for pain. Keep working with physical therapy.   Follow up in 2 months to check your kidneys   Future Appointments  Date Time Provider Department Center  02/06/2024  2:30 PM [facility] PA/NP [facility] None  08/26/2024  2:20 PM [facility] ANNUAL WELLNESS VISIT [facility]    Please arrive 15 minutes before your appointment to ensure smooth check in process.  If you are more than 15 minutes late, you may be asked to reschedule.   Please bring a list of your medications with you to all appointments.   Please call the clinic at [phone] if your symptoms worsen or you have any concerns.  Thank you for allowing me to participate in your care, Dr. Damien Pinal [facility]

## 2024-01-16 NOTE — Assessment & Plan Note (Signed)
 Refilled oxycodone -acetaminophen  - patient to use at night and working with PT  Follow up planned with neurosurgery Encouraged patient to continue to wear his brace until he is cleared by NSGY  Ordered home health PT

## 2024-01-16 NOTE — Progress Notes (Signed)
    SUBJECTIVE:   CHIEF COMPLAINT / HPI:   [name] is a 73 y.o. male presenting for hospital follow up.  Patient's wife was in attendance and assisted with HPI.   I reviewed previous admission notes: admitted 2/2 to [facility] now with new lumbar burst fracture. Has follow up with neurosurgery planned. Continues to wear a brace and have significant pain. Has trial norco with somewhat relief.  I reviewed previous lab results: Cr stable at last hospitalization. Will continue to check routinely every 3 months.   PERTINENT  PMH / PSH: reviewed and updated.  OBJECTIVE:   BP 114/72   Pulse 91   Ht 5' 4 (1.626 m)   Wt 182 lb 6 oz (82.7 kg)   SpO2 97%   BMI 31.30 kg/m   Uncomfortable-appearing, no acute distress Cardio: Regular rate, regular rhythm, no murmurs on exam. Pulm: Clear, no wheezing, no crackles. No increased work of breathing Abdominal: bowel sounds present, soft, non-tender, non-distended Extremities: no peripheral edema  Neuro: alert and oriented x3, speech normal in content, no facial asymmetry, strength intact and equal bilaterally in UE and LE, pupils equal and reactive to light.   ASSESSMENT/PLAN:   Assessment & Plan Closed burst fracture of lumbar vertebra, sequela Closed fracture of multiple ribs, unspecified laterality, sequela Refilled oxycodone -acetaminophen  - patient to use at night and working with PT  Follow up planned with neurosurgery Encouraged patient to continue to wear his brace until he is cleared by NSGY  Ordered home health PT  Type 2 diabetes mellitus with stage 3a chronic kidney disease, without long-term current use of insulin  (HCC) Patient would like to have follow up and management of his kidney disease within [facility] to reduce his overall doctors appointments.  Plan to monitor kidney function with BMP every 3 months  If Cr >3 will send back to [facility]      Damien Pinal, DO [facility]

## 2024-01-16 NOTE — Assessment & Plan Note (Addendum)
 Patient would like to have follow up and management of his kidney disease within [facility] to reduce his overall doctors appointments.  Plan to monitor kidney function with BMP every 3 months  If Cr >3 will send back to [facility]

## 2024-01-22 ENCOUNTER — Telehealth: Payer: Self-pay | Admitting: Pharmacist

## 2024-01-22 ENCOUNTER — Other Ambulatory Visit (HOSPITAL_COMMUNITY): Payer: Self-pay

## 2024-01-22 ENCOUNTER — Encounter: Payer: Self-pay | Admitting: Student

## 2024-01-22 DIAGNOSIS — I10 Essential (primary) hypertension: Secondary | ICD-10-CM

## 2024-01-22 MED ORDER — TORSEMIDE 20 MG PO TABS: 40.0000 mg | ORAL_TABLET | Freq: Every day | ORAL | 5 refills | Status: AC

## 2024-01-22 NOTE — Telephone Encounter (Signed)
 Reviewed and agree with Dr Rennis plan.

## 2024-01-22 NOTE — Telephone Encounter (Signed)
 Attempted to contact patient for follow-up of glucose control and need for metformin  use.   9/19 paramedic team asked for clarification of need to use metformin  and Dr. Cleotilde replied via patient message - stopped in hospital and not restarted on D/C - continue same.   Today- review of CGM data shows great control with 14 average glucose of 179 and GMI of 7.9 which are both significantly improved from pre- hospitalization likely due to improved adherence with basal insulin  therapy.   No need for metformin  at this time.  (Medication NOT on current medication list).   Left HIPAA compliant voice mail requesting call back to direct phone: [phone] if they wanted to discuss glucose control or had any treatment questions.   Total time with patient call and documentation of interaction: 11 minutes.

## 2024-01-22 NOTE — Telephone Encounter (Signed)
-----   Message from [facility] Katie L sent at 01/11/2024  4:23 PM EDT ----- Regarding: rx Hey,    He was recently admitted in hospital from [facility] and ended up with back fx and then went to short term rehab and just got home.  Per the d/c summary he was to stop taking metformin , not sure exactly why, possibly due to elevated kidney function ???  But we are not sure if he was taking it in the rehab or not. The rehab sent home papers with metformin  on that list/he doesn't know if they were giving it to him or not,  so not sure where the disconnect is coming from and hoping you can guide us /them to see if he needs to resume metformin  at home or not.   Thanks,  Izetta Quivers, EMT-Paramedic  [phone] 01/11/2024

## 2024-01-24 DIAGNOSIS — S32010D Wedge compression fracture of first lumbar vertebra, subsequent encounter for fracture with routine healing: Secondary | ICD-10-CM | POA: Diagnosis not present

## 2024-01-24 DIAGNOSIS — S32011A Stable burst fracture of first lumbar vertebra, initial encounter for closed fracture: Secondary | ICD-10-CM | POA: Diagnosis not present

## 2024-01-24 DIAGNOSIS — Z6832 Body mass index (BMI) 32.0-32.9, adult: Secondary | ICD-10-CM | POA: Diagnosis not present

## 2024-01-29 ENCOUNTER — Other Ambulatory Visit (HOSPITAL_COMMUNITY): Payer: Self-pay
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
## 2024-01-31 NOTE — Progress Notes (Signed)
 [facility] Note   Referring Physician: PCP: Cleotilde Perkins, DO PCP-Cardiologist: Vinie JAYSON Maxcy, MD  AHF MD: Dr. Rolan   Chief Complaint: f/u for systolic heart failure  HPI:  73 y.o. 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    He was admitted 09/21/23 with epigastric pain radiating to chest and neck. HS troponin elevated to 673. BP markedly elevated. Echo with EF 35-40%, RV not well visualized. Cardiac cath with severe 3 vessel CAD. LVEDP 38-40 mmHg. He was diuresed with IV lasix  and started on GDMT.  He underwent CABG X 4 (LIMA to LAD, SVG to OM1, sequential to OM3, SVG to PDA) by Dr. Kerrin on 09/27/23. There was significant difficulty separating from cardiopulmonary bypass (took 3 attempts). Transferred to the ICU on inotrope/pressors. AHF team consulted to assist w/ post cardiotomy shock. Course was further complicated by AKI due to ATN. Scr rose to > 4 (prior baseline 1.3-1.6). He was started on CRRT for clearance and volume removal. Fortunately, he had renal recovery and able to wean off of CRRT. Also able to wean off pressors/inotropes. Transitioned to PO torsemide  for continued volume management and Imdur /hydralazine  for HF GDMT.  He was discharged home on 10/13/23. D/c wt 195 lb.   Echo was done today and reviewed, EF 30% with no LV thrombus, septal-lateral dyssynchrony due to LBBB, mild RV systolic dysfunction, IVC normal.   He presents today for followup of CHF.  Weight is down 8 lbs.  Chest wall is still a little tender post-CABG.  Occasional dyspnea, no trigger.  Generally does fine walking on flat ground and doing ADLs.  No lightheadedness.  No orthopnea/PND.  Has not excercised much.   ECG (Personally reviewed): NSR, LBBB 130 msec  Labs (8/25): hgb 11.6, K 3.8, creatinine 2.57  Review of Systems: All systems reviewed and negative except as per HPI.   PMH: 1. CAD:   PCI LAD in 2005.  - CABG x 4 (6/25) with LIMA-LAD, SVG-OM1, seq SVG-OM3 and PDA.  2. Type 2 diabetes 3. HTN 4. CKD stage 3 5. Hyperlipidemia 6. Chronic systolic CHF: Ischemic cardiomyopathy.  Echo (6/25) with EF 35-40%, RV poorly visualized => repeat post-CABG in 6/25 with EF 25-30%.  - Echo (9/25): EF 30% with no LV thrombus, septal-lateral dyssynchrony due to LBBB, mild RV systolic dysfunction, IVC normal.  7. LBBB   Current Outpatient Medications  Medication Sig Dispense Refill   Accu-Chek Softclix Lancets lancets USE AS DIRECTED UP TO FOUR TIMES DAILY 100 each 12   aspirin  EC 81 MG tablet Take 81 mg by mouth daily.     atorvastatin  (LIPITOR ) 80 MG tablet Take 1 tablet (80 mg total) by mouth daily. (Patient taking differently: Take 80 mg by mouth every evening.) 90 tablet 0   BD PEN NEEDLE NANO 2ND GEN 32G X 4 MM MISC USE 3 TIMES DAILY AS NEEDED BETWEEN MEALS AND BEDTIME 100 each 2   blood glucose meter kit and supplies Dispense based on patient and insurance preference. Use up to four times daily as directed. (FOR ICD-10 E10.9, E11.9). 1 each 0   Blood Glucose Monitoring Suppl (ACCU-CHEK GUIDE ME) w/Device KIT Please use to check blood sugar level up to four times daily. 1 kit 0   carvedilol  (COREG ) 12.5 MG tablet Take 1 tablet (12.5 mg total) by mouth 2 (two) times daily with a meal. 60 tablet 5  clopidogrel  (PLAVIX ) 75 MG tablet Take 1 tablet (75 mg total) by mouth daily. (Patient taking differently: Take 75 mg by mouth at bedtime.) 90 tablet 1   Continuous Glucose Sensor (FREESTYLE LIBRE 3 PLUS SENSOR) MISC Change sensor every 15 days. 1 each 3   Continuous Glucose Sensor (FREESTYLE LIBRE 3 SENSOR) MISC Place 1 sensor on the skin every 14 days. Use to check glucose continuously 2 each 11   cyclobenzaprine  (FLEXERIL ) 10 MG tablet Take 0.5 tablets (5 mg total) by mouth 2 (two) times daily as needed for muscle spasms. 10 tablet 0   empagliflozin  (JARDIANCE ) 10 MG TABS tablet Take 1  tablet (10 mg total) by mouth daily before breakfast. 30 tablet 5   Fe Fum-Vit C-Vit B12-FA (TRIGELS-F FORTE PO) Take 1 tablet by mouth every Monday, Wednesday, and Friday.     feeding supplement (ENSURE PLUS HIGH PROTEIN) LIQD Take 237 mLs by mouth 2 (two) times daily between meals.     glucose blood (ACCU-CHEK GUIDE TEST) test strip Please use to check blood sugar up to 4 times daily. 100 each 12   guaiFENesin  (MUCINEX ) 600 MG 12 hr tablet Take 1 tablet (600 mg total) by mouth 2 (two) times daily as needed for to loosen phlegm or cough.     hydrALAZINE  (APRESOLINE ) 50 MG tablet Take 1 tablet (50 mg total) by mouth 3 (three) times daily. 90 tablet 1   Insulin  Glargine (BASAGLAR  KWIKPEN) 100 UNIT/ML Inject 18 Units into the skin daily.     isosorbide  mononitrate (IMDUR ) 30 MG 24 hr tablet Take 1 tablet (30 mg total) by mouth daily. (Patient taking differently: Take 30 mg by mouth at bedtime.) 90 tablet 1   lidocaine  (LIDODERM ) 5 % Place 1 patch onto the skin daily. Remove & Discard patch within 12 hours or as directed by MD (Patient not taking: Reported on 12/30/2023) 14 patch 0   ondansetron  (ZOFRAN -ODT) 4 MG disintegrating tablet Take 1 tablet (4 mg total) by mouth every 8 (eight) hours as needed for nausea or vomiting.     polyethylene glycol (MIRALAX  / GLYCOLAX ) 17 g packet Take 17 g by mouth 2 (two) times daily.     [Paused] potassium chloride  SA (KLOR-CON  M) 20 MEQ tablet Take 1 tablet (20 mEq total) by mouth daily. (Patient not taking: Reported on 01/15/2024) 30 tablet 3   torsemide  (DEMADEX ) 20 MG tablet Take 2 tablets (40 mg total) by mouth daily. 60 tablet 5   No current facility-administered medications for this visit.    Allergies  Allergen Reactions   Trulicity [Dulaglutide] Other (See Comments)    Patient indicated this caused pancreatitis.       Social History   Socioeconomic History   Marital status: Married    Spouse name: [name]   Number of children: 4   Years of  education: 10   Highest education level: 10th grade  Occupational History   Occupation: retired  Tobacco Use   Smoking status: Former    Current packs/day: 0.00    Average packs/day: 0.5 packs/day for 10.0 years (5.0 ttl pk-yrs)    Types: Cigarettes    Start date: 04/26/1983    Quit date: 04/25/1993    Years since quitting: 30.7    Passive exposure: Past   Smokeless tobacco: Never  Vaping Use   Vaping status: Never Used  Substance and Sexual Activity   Alcohol use: No   Drug use: No   Sexual activity: Yes  Other Topics Concern  Not on file  Social History Narrative   Patient lives with his wife in [place].   Patent has 4 boys, one lives in [place] .    Patient enjoys fishing, camping in the woods, working outside, anything outdoor related.   Social Drivers of Health   Financial Resource Strain: Medium Risk (10/31/2023)   Overall Financial Resource Strain (CARDIA)    Difficulty of Paying Living Expenses: Somewhat hard  Food Insecurity: No Food Insecurity (12/31/2023)   Hunger Vital Sign    Worried About Running Out of Food in the Last Year: Never true    Ran Out of Food in the Last Year: Never true  Recent Concern: Food Insecurity - Food Insecurity Present (10/31/2023)   Hunger Vital Sign    Worried About Running Out of Food in the Last Year: Sometimes true    Ran Out of Food in the Last Year: Sometimes true  Transportation Needs: No Transportation Needs (12/31/2023)   PRAPARE - Administrator, Civil Service (Medical): No    Lack of Transportation (Non-Medical): No  Physical Activity: Inactive (10/31/2023)   Exercise Vital Sign    Days of Exercise per Week: 0 days    Minutes of Exercise per Session: Not on file  Stress: No Stress Concern Present (10/31/2023)   [employer] of Occupational Health - Occupational Stress Questionnaire    Feeling of Stress: Only a little  Social Connections: Moderately Isolated (12/31/2023)   Social Connection and Isolation Panel     Frequency of Communication with Friends and Family: More than three times a week    Frequency of Social Gatherings with Friends and Family: Once a week    Attends Religious Services: Never    Database administrator or Organizations: No    Attends Banker Meetings: Never    Marital Status: Married  Catering manager Violence: Not At Risk (12/31/2023)   Humiliation, Afraid, Rape, and Kick questionnaire    Fear of Current or Ex-Partner: No    Emotionally Abused: No    Physically Abused: No    Sexually Abused: No      Family History  Problem Relation Age of Onset   Heart disease Mother    Diabetes Mother    Hypertension Mother    Alcoholism Father    Heart disease Father    Hypertension Sister    Diabetes Brother    Hypercholesterolemia Brother    Osteoporosis Brother    Hypertension Brother    Kidney disease Brother     There were no vitals filed for this visit.    PHYSICAL EXAM: General: NAD Neck: No JVD, no thyromegaly or thyroid  nodule.  Lungs: Clear to auscultation bilaterally with normal respiratory effort. CV: Nondisplaced PMI.  Heart regular S1/S2, no S3/S4, no murmur.  No peripheral edema.  No carotid bruit.  Normal pedal pulses.  Abdomen: Soft, nontender, no hepatosplenomegaly, no distention.  Skin: Intact without lesions or rashes.  Neurologic: Alert and oriented x 3.  Psych: Normal affect. Extremities: No clubbing or cyanosis.  HEENT: Normal.   ASSESSMENT & PLAN:  1. Chronic systolic CHF: Ischemic cardiomyopathy. Echo pre-CABG with EF 35-40%, RV hard to visualize. Difficulty separating from cardiopulmonary bypass following CABG 09/27/23. Required inotropic support for post-cardiotomy shock. Repeat Echo 09/30/23 EF 25-30%, RV not well visualized. Recovered and weaned off pressors.  Echo was reviewed today and showed EF 30% with no LV thrombus, septal-lateral dyssynchrony due to LBBB, mild RV systolic dysfunction, IVC normal.  NYHA class  II symptoms, not  volume overloaded on exam. GDMT limited by CKD stage 3.  - EF remains low 3 months post-CABG, he also has dyssynchrony on echo even though LBBB is not markedly wide (130 msec). Refer to EP for consideration of CRT-D.  - Start Jardiance  10 mg daily and decrease torsemide  to 40 mg daily.  BMET/BNP today, BMET in 10 days.  - Continue Imdur  30 and hydralazine  50 tid.  - Increase Coreg  to 12.5 mg bid.  2. CAD: History of PCI to LAD in 2005.  NSTEMI 09/21/23 with multivessel disease on LHC. S/p CABG X 4 (LIMA to LAD, SVG to OM1, sequential SVG to OM3, PDA) on 09/27/23.  No ischemic chest pain.  - Continue ASA + plavix  75 mg daily - Continue atorva 80 mg daily. Check lipids today.  - Refer to cardiac rehab.   3. CKD IIIb: Developed AKI due ATN post-CABG. SCr rose > 4. He required short term CRRT for clearance and volume removal. Fortunately w/ renal recovery. Creatinine has been stable recently around 2.5.  - BMET today.  4. DM II: Per PCP.   Folllowup in 6 wks with APP for medication titration.   I spent 31 minutes reviewing records, interviewing/examining patient, and managing orders.   Harlene HERO [place], FNP 01/31/24

## 2024-02-01 ENCOUNTER — Telehealth: Payer: Self-pay | Admitting: Pharmacist

## 2024-02-01 NOTE — Telephone Encounter (Signed)
 Attempted phone contact to follow-up for glucose control.  First atttempt - no answer.

## 2024-02-01 NOTE — Telephone Encounter (Signed)
 Attempted to contact patient for follow-up of glucose control   Multiple attempts made to contact over 90 minute period.  Phone rolling directly to VM. Left message requesting call back as I will be leaving mid-day. Left HIPAA compliant voice mail requesting call back to direct phone: [phone]  IF patient fails to return call - sent to PCP for follow-up.   CGM appears elevated and dose increase of basal insulin  appears appropriate at this time.   GMI of 8.9 Average 232 TIR of 14%  Minimal lows until this AM   Total time with patient call and documentation of interaction: 22 minutes.  Follow-up phone call planned: PCP requested to call later today.

## 2024-02-02 NOTE — Telephone Encounter (Signed)
 Patients wife returns call to nurse line.   She reports she received Dr. Dianne message in regards to Basaglar .   She reports they are easier to get a hold of in the afternoons.   She requests Dr. Koval call them Monday after lunch.  Advised will forward to Koval.   Offered pharmacy apt, however they declined at the time.

## 2024-02-02 NOTE — Telephone Encounter (Signed)
 Reviewed and agree with Dr Rennis plan.

## 2024-02-05 ENCOUNTER — Telehealth (HOSPITAL_COMMUNITY): Payer: Self-pay

## 2024-02-05 ENCOUNTER — Telehealth: Payer: Self-pay | Admitting: Pharmacist

## 2024-02-05 DIAGNOSIS — E119 Type 2 diabetes mellitus without complications: Secondary | ICD-10-CM

## 2024-02-05 MED ORDER — BASAGLAR KWIKPEN 100 UNIT/ML ~~LOC~~ SOPN
20.0000 [IU] | PEN_INJECTOR | Freq: Every day | SUBCUTANEOUS | Status: DC
Start: 2024-02-05 — End: 2024-03-14

## 2024-02-05 NOTE — Telephone Encounter (Signed)
 Called to confirm/remind patient of their appointment at the [facility] on 02/06/24.   Appointment:   [x] Confirmed  [] Left mess   [] No answer/No voice mail  [] VM Full/unable to leave message  [] Phone not in service  Patient reminded to bring all medications and/or complete list.  Confirmed patient has transportation. Gave directions, instructed to utilize valet parking.

## 2024-02-05 NOTE — Telephone Encounter (Signed)
 Patient contacted for follow-up of Diabetes Glucose control.   Since last contact patient reports overall higher glucose values.  Elevation today explained by eating 10 grapes.  Advised to consume no more than 5 grapes at a single situation.   Current Medications include: Basaglar  (insulin  glargine) 22 units daily Low reading last week likely related to extra dose of 18 units administered when by Lorrene when he saw high readings.   Medication Plan: - Reduct slightly to 20 units daily at this time.   Continue to monitor for elevated readings.  Current goal is to avoid any low readings< 70 for the next several weeks.  Discussed dietary portion control.   Total time with patient call and documentation of interaction: 14 minutes.

## 2024-02-06 ENCOUNTER — Inpatient Hospital Stay (HOSPITAL_COMMUNITY)
Admission: RE | Admit: 2024-02-06 | Discharge: 2024-02-06 | Disposition: A | Discharge status: Home / Self Care | Source: Ambulatory Visit

## 2024-02-15 ENCOUNTER — Other Ambulatory Visit (HOSPITAL_COMMUNITY): Payer: Self-pay

## 2024-02-15 NOTE — Progress Notes (Signed)
 Paramedicine Encounter    Patient ID: [name], male    DOB: [birth year], 73 y.o.   MRN: [id number]   Complaints-none--lower back pain from accident when he sits down for long time   Edema-none   Compliance with meds-yes  Pill box filled-no If so, by whom-wife handles   Refills needed-  Pt reports he is doing good. He reports his back is feeling better except when he sits down for a long time.  He reports that [facility] reached out that they didn't have any companies that would not accept him as patient-unknown why--but sounds like he may not need it at this point as he is able to walk up the road now and feeling better.   Wife reports he is doing good on his meds.  He reports his breathing is doing good.  He denies c/p, no dizziness, no palpitations.  Wife handles meds.   Weight has been between 182-190  but the scales were being a little dysfunctional and fluttering numbers.   No edema noted.  He said he has quit taking the iron  vitamin due to constipation.  I told him to relay this to his PCP.   CBG-253   BP (!) 142/74   Pulse 78   Resp 16   Wt 189 lb (85.7 kg)   SpO2 98%   BMI 32.44 kg/m  Weight yesterday--190 Last visit weight-182  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   Type 2 diabetes mellitus with hyperglycemia, with long-term current use of insulin  (HCC) 09/23/2023   Ischemic heart disease due to coronary artery obstruction 09/22/2023   Type 2 diabetes mellitus with diabetic chronic kidney disease (HCC) 09/21/2023    Coronary artery disease involving native coronary artery of native heart without angina pectoris    Hypercholesterolemia    Low-level of literacy 05/21/2018   Globus sensation 03/12/2018   Essential hypertension, benign 06/09/2017    Current Outpatient Medications:    Accu-Chek Softclix Lancets lancets, USE AS DIRECTED UP TO FOUR TIMES DAILY, Disp: 100 each, Rfl: 12   aspirin  EC 81 MG tablet, Take 81 mg by mouth daily., Disp: , Rfl:    atorvastatin  (LIPITOR ) 80 MG tablet, Take 1 tablet (80 mg total) by mouth daily. (Patient taking differently: Take 80 mg by mouth every evening.), Disp: 90 tablet, Rfl: 0   BD PEN NEEDLE NANO 2ND GEN 32G X 4 MM MISC, USE 3 TIMES DAILY AS NEEDED BETWEEN MEALS AND BEDTIME, Disp: 100 each, Rfl: 2   blood glucose meter kit and supplies, Dispense based on patient and insurance preference. Use up to four times daily as directed. (FOR ICD-10 E10.9, E11.9)., Disp: 1 each, Rfl: 0   Blood Glucose Monitoring Suppl (ACCU-CHEK GUIDE ME) w/Device KIT, Please use to check blood sugar level up to four times daily., Disp: 1 kit, Rfl: 0   carvedilol  (COREG ) 12.5 MG tablet, Take 1 tablet (12.5 mg total) by mouth 2 (two) times daily with a meal., Disp: 60 tablet, Rfl: 5   clopidogrel  (PLAVIX ) 75 MG tablet, Take 1 tablet (75 mg total)  by mouth daily. (Patient taking differently: Take 75 mg by mouth at bedtime.), Disp: 90 tablet, Rfl: 1   Continuous Glucose Sensor (FREESTYLE LIBRE 3 PLUS SENSOR) MISC, Change sensor every 15 days., Disp: 1 each, Rfl: 3   Continuous Glucose Sensor (FREESTYLE LIBRE 3 SENSOR) MISC, Place 1 sensor on the skin every 14 days. Use to check glucose continuously, Disp: 2 each, Rfl: 11   cyclobenzaprine  (FLEXERIL ) 10 MG tablet, Take 0.5 tablets (5 mg total) by mouth 2 (two) times daily as needed for muscle spasms., Disp: 10 tablet, Rfl: 0   empagliflozin  (JARDIANCE ) 10 MG TABS tablet, Take 1 tablet (10 mg total) by mouth daily before breakfast., Disp: 30 tablet,  Rfl: 5   Fe Fum-Vit C-Vit B12-FA (TRIGELS-F FORTE PO), Take 1 tablet by mouth every Monday, Wednesday, and Friday., Disp: , Rfl:    feeding supplement (ENSURE PLUS HIGH PROTEIN) LIQD, Take 237 mLs by mouth 2 (two) times daily between meals., Disp: , Rfl:    glucose blood (ACCU-CHEK GUIDE TEST) test strip, Please use to check blood sugar up to 4 times daily., Disp: 100 each, Rfl: 12   guaiFENesin  (MUCINEX ) 600 MG 12 hr tablet, Take 1 tablet (600 mg total) by mouth 2 (two) times daily as needed for to loosen phlegm or cough., Disp: , Rfl:    hydrALAZINE  (APRESOLINE ) 50 MG tablet, Take 1 tablet (50 mg total) by mouth 3 (three) times daily., Disp: 90 tablet, Rfl: 1   Insulin  Glargine (BASAGLAR  KWIKPEN) 100 UNIT/ML, Inject 20 Units into the skin daily., Disp: , Rfl:    isosorbide  mononitrate (IMDUR ) 30 MG 24 hr tablet, Take 1 tablet (30 mg total) by mouth daily. (Patient taking differently: Take 30 mg by mouth at bedtime.), Disp: 90 tablet, Rfl: 1   lidocaine  (LIDODERM ) 5 %, Place 1 patch onto the skin daily. Remove & Discard patch within 12 hours or as directed by MD, Disp: 14 patch, Rfl: 0   ondansetron  (ZOFRAN -ODT) 4 MG disintegrating tablet, Take 1 tablet (4 mg total) by mouth every 8 (eight) hours as needed for nausea or vomiting., Disp: , Rfl:    polyethylene glycol (MIRALAX  / GLYCOLAX ) 17 g packet, Take 17 g by mouth 2 (two) times daily., Disp: , Rfl:    [Paused] potassium chloride  SA (KLOR-CON  M) 20 MEQ tablet, Take 1 tablet (20 mEq total) by mouth daily., Disp: 30 tablet, Rfl: 3   torsemide  (DEMADEX ) 20 MG tablet, Take 2 tablets (40 mg total) by mouth daily., Disp: 60 tablet, Rfl: 5 Allergies  Allergen Reactions   Trulicity [Dulaglutide] Other (See Comments)    Patient indicated this caused pancreatitis.       Social History   Socioeconomic History   Marital status: Married    Spouse name: [name]   Number of children: 4   Years of education: 10   Highest education level: 10th grade   Occupational History   Occupation: retired  Tobacco Use   Smoking status: Former    Current packs/day: 0.00    Average packs/day: 0.5 packs/day for 10.0 years (5.0 ttl pk-yrs)    Types: Cigarettes    Start date: 04/26/1983    Quit date: 04/25/1993    Years since quitting: 30.8    Passive exposure: Past   Smokeless tobacco: Never  Vaping Use   Vaping status: Never Used  Substance and Sexual Activity   Alcohol use: No   Drug use: No   Sexual activity: Yes  Other Topics Concern   Not  on file  Social History Narrative   Patient lives with his wife in [place].   Patent has 4 boys, one lives in [place] .    Patient enjoys fishing, camping in the woods, working outside, anything outdoor related.   Social Drivers of Health   Financial Resource Strain: Medium Risk (10/31/2023)   Overall Financial Resource Strain (CARDIA)    Difficulty of Paying Living Expenses: Somewhat hard  Food Insecurity: No Food Insecurity (12/31/2023)   Hunger Vital Sign    Worried About Running Out of Food in the Last Year: Never true    Ran Out of Food in the Last Year: Never true  Recent Concern: Food Insecurity - Food Insecurity Present (10/31/2023)   Hunger Vital Sign    Worried About Running Out of Food in the Last Year: Sometimes true    Ran Out of Food in the Last Year: Sometimes true  Transportation Needs: No Transportation Needs (12/31/2023)   PRAPARE - Administrator, Civil Service (Medical): No    Lack of Transportation (Non-Medical): No  Physical Activity: Inactive (10/31/2023)   Exercise Vital Sign    Days of Exercise per Week: 0 days    Minutes of Exercise per Session: Not on file  Stress: No Stress Concern Present (10/31/2023)   [employer] of Occupational Health - Occupational Stress Questionnaire    Feeling of Stress: Only a little  Social Connections: Moderately Isolated (12/31/2023)   Social Connection and Isolation Panel    Frequency of Communication with Friends and Family: More  than three times a week    Frequency of Social Gatherings with Friends and Family: Once a week    Attends Religious Services: Never    Database administrator or Organizations: No    Attends Banker Meetings: Never    Marital Status: Married  Catering manager Violence: Not At Risk (12/31/2023)   Humiliation, Afraid, Rape, and Kick questionnaire    Fear of Current or Ex-Partner: No    Emotionally Abused: No    Physically Abused: No    Sexually Abused: No    Physical Exam      Future Appointments  Date Time Provider Department Center  02/23/2024  3:00 PM [facility] PA/NP [facility] None  08/26/2024  2:20 PM [facility] ANNUAL WELLNESS VISIT [facility]       Izetta Quivers, Paramedic [phone] [facility] Paramedic  02/15/24

## 2024-02-21 ENCOUNTER — Telehealth: Payer: Self-pay | Admitting: Pharmacist

## 2024-02-21 NOTE — Telephone Encounter (Signed)
 Reviewed and agree with Dr Rennis plan.

## 2024-02-21 NOTE — Telephone Encounter (Signed)
 Patient contacted for follow-up of glucose control.  Review of CGM data reveals 14 day  Average 239 TIR 12% GMI 9.0  Since last contact patient reports feeling well, eating too many grapes and taking Lantus  (insulin  glargine) 20 units daily.   Following discussion, patient agreed to cut back on Grapes.   Medication Plan: - Increased dose of Lantus  (insulin  glargine) from 20 to 24 units daily.    Appointment scheduled 11/20 in pharmacy clinic for additional follow-up  Total time with patient call and documentation of interaction: 13 minutes.

## 2024-02-22 ENCOUNTER — Other Ambulatory Visit: Payer: Self-pay | Admitting: Student

## 2024-02-22 ENCOUNTER — Telehealth (HOSPITAL_COMMUNITY): Payer: Self-pay

## 2024-02-22 NOTE — Telephone Encounter (Signed)
 Called to confirm/remind patient of their appointment at the [facility] on 02/23/24.   Appointment:   [] Confirmed  [x] Left mess   [] No answer/No voice mail  [] VM Full/unable to leave message  [] Phone not in service  And to bring in all medications and/or complete list.

## 2024-02-23 ENCOUNTER — Ambulatory Visit (HOSPITAL_COMMUNITY)
Admission: RE | Admit: 2024-02-23 | Discharge: 2024-02-23 | Disposition: A | Discharge status: Home / Self Care | Source: Ambulatory Visit | Attending: Physician Assistant | Admitting: Physician Assistant

## 2024-02-23 VITALS — BP 144/78 | HR 86 | Wt 187.0 lb

## 2024-02-23 DIAGNOSIS — I5022 Chronic systolic (congestive) heart failure: Secondary | ICD-10-CM | POA: Insufficient documentation

## 2024-02-23 DIAGNOSIS — I447 Left bundle-branch block, unspecified: Secondary | ICD-10-CM | POA: Insufficient documentation

## 2024-02-23 DIAGNOSIS — N1832 Chronic kidney disease, stage 3b: Secondary | ICD-10-CM | POA: Insufficient documentation

## 2024-02-23 DIAGNOSIS — Z794 Long term (current) use of insulin: Secondary | ICD-10-CM | POA: Diagnosis not present

## 2024-02-23 DIAGNOSIS — Z87891 Personal history of nicotine dependence: Secondary | ICD-10-CM | POA: Insufficient documentation

## 2024-02-23 DIAGNOSIS — I130 Hypertensive heart and chronic kidney disease with heart failure and stage 1 through stage 4 chronic kidney disease, or unspecified chronic kidney disease: Secondary | ICD-10-CM | POA: Diagnosis not present

## 2024-02-23 DIAGNOSIS — I2510 Atherosclerotic heart disease of native coronary artery without angina pectoris: Secondary | ICD-10-CM | POA: Insufficient documentation

## 2024-02-23 DIAGNOSIS — Z7982 Long term (current) use of aspirin: Secondary | ICD-10-CM | POA: Diagnosis not present

## 2024-02-23 DIAGNOSIS — Z79899 Other long term (current) drug therapy: Secondary | ICD-10-CM | POA: Insufficient documentation

## 2024-02-23 DIAGNOSIS — Z7984 Long term (current) use of oral hypoglycemic drugs: Secondary | ICD-10-CM | POA: Diagnosis not present

## 2024-02-23 DIAGNOSIS — I252 Old myocardial infarction: Secondary | ICD-10-CM | POA: Insufficient documentation

## 2024-02-23 DIAGNOSIS — N184 Chronic kidney disease, stage 4 (severe): Secondary | ICD-10-CM

## 2024-02-23 DIAGNOSIS — E785 Hyperlipidemia, unspecified: Secondary | ICD-10-CM | POA: Insufficient documentation

## 2024-02-23 DIAGNOSIS — I255 Ischemic cardiomyopathy: Secondary | ICD-10-CM | POA: Diagnosis not present

## 2024-02-23 DIAGNOSIS — Z955 Presence of coronary angioplasty implant and graft: Secondary | ICD-10-CM | POA: Diagnosis not present

## 2024-02-23 DIAGNOSIS — Z7902 Long term (current) use of antithrombotics/antiplatelets: Secondary | ICD-10-CM | POA: Insufficient documentation

## 2024-02-23 DIAGNOSIS — E1122 Type 2 diabetes mellitus with diabetic chronic kidney disease: Secondary | ICD-10-CM | POA: Insufficient documentation

## 2024-02-23 DIAGNOSIS — Z951 Presence of aortocoronary bypass graft: Secondary | ICD-10-CM | POA: Insufficient documentation

## 2024-02-23 LAB — BASIC METABOLIC PANEL WITH GFR
Anion gap: 14 (ref 5–15)
BUN: 44 mg/dL — ABNORMAL HIGH (ref 8–23)
CO2: 27 mmol/L (ref 22–32)
Calcium: 9.1 mg/dL (ref 8.9–10.3)
Chloride: 99 mmol/L (ref 98–111)
Creatinine, Ser: 2.23 mg/dL — ABNORMAL HIGH (ref 0.61–1.24)
GFR, Estimated: 30 mL/min — ABNORMAL LOW (ref >60)
Glucose, Bld: 249 mg/dL — ABNORMAL HIGH (ref 70–99)
Potassium: 3.7 mmol/L (ref 3.5–5.1)
Sodium: 140 mmol/L (ref 135–145)

## 2024-02-23 LAB — BRAIN NATRIURETIC PEPTIDE: B Natriuretic Peptide: 75.9 pg/mL (ref 0.0–100.0)

## 2024-02-23 MED ORDER — HYDRALAZINE HCL 50 MG PO TABS: 75.0000 mg | ORAL_TABLET | Freq: Three times a day (TID) | ORAL | 3 refills | Status: AC

## 2024-02-23 NOTE — Progress Notes (Signed)
 [facility] Note   Referring Physician: PCP: Cleotilde Perkins, DO PCP-Cardiologist: Vinie JAYSON Maxcy, MD  AHF MD: Dr. Rolan   Chief Complaint: f/u for systolic heart failure  HPI:  73 y.o. 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    He was admitted 09/21/23 with epigastric pain radiating to chest and neck. HS troponin elevated to 673. BP markedly elevated. Echo with EF 35-40%, RV not well visualized. Cardiac cath with severe 3 vessel CAD. LVEDP 38-40 mmHg. He was diuresed with IV lasix  and started on GDMT.  He underwent CABG X 4 (LIMA to LAD, SVG to OM1, sequential to OM3, SVG to PDA) by Dr. Kerrin on 09/27/23. There was significant difficulty separating from cardiopulmonary bypass (took 3 attempts). Transferred to the ICU on inotrope/pressors. AHF team consulted to assist w/ post cardiotomy shock. Course was further complicated by AKI due to ATN. Scr rose to > 4 (prior baseline 1.3-1.6). He was started on CRRT for clearance and volume removal. Fortunately, he had renal recovery and able to wean off of CRRT. Also able to wean off pressors/inotropes. Transitioned to PO torsemide  for continued volume management and Imdur /hydralazine  for HF GDMT.  He was discharged home on 10/13/23. D/c wt 195 lb.   Echo 9/25 EF 30% with no LV thrombus, septal-lateral dyssynchrony due to LBBB, mild RV systolic dysfunction, IVC normal.   Last seen 10/14. Coreg  increased. He was referred to EP for consideration of CRT-D.  He is here today for CHF follow-up. Doing well. No concerns. Denies shortness of breath, orthopnea, PND, lower extremity edema. He does not know his medications very well. His wife assist him and pill boxes are set up by Paramedicine. He reports his wife does not cook with salt.   Labs (8/25): hgb 11.6, K 3.8, creatinine 2.57 Labs (09/25): Scr 2.31, K 4.0, Hgb 11.7  Review of Systems: All  systems reviewed and negative except as per HPI.   PMH: 1. CAD:  PCI LAD in 2005.  - CABG x 4 (6/25) with LIMA-LAD, SVG-OM1, seq SVG-OM3 and PDA.  2. Type 2 diabetes 3. HTN 4. CKD stage 3 5. Hyperlipidemia 6. Chronic systolic CHF: Ischemic cardiomyopathy.  Echo (6/25) with EF 35-40%, RV poorly visualized => repeat post-CABG in 6/25 with EF 25-30%.  - Echo (9/25): EF 30% with no LV thrombus, septal-lateral dyssynchrony due to LBBB, mild RV systolic dysfunction, IVC normal.  7. LBBB   Current Outpatient Medications  Medication Sig Dispense Refill   Accu-Chek Softclix Lancets lancets USE AS DIRECTED UP TO FOUR TIMES DAILY 100 each 12   aspirin  EC 81 MG tablet Take 81 mg by mouth daily.     atorvastatin  (LIPITOR ) 80 MG tablet Take 1 tablet (80 mg total) by mouth daily. 90 tablet 0   blood glucose meter kit and supplies Dispense based on patient and insurance preference. Use up to four times daily as directed. (FOR ICD-10 E10.9, E11.9). 1 each 0   Blood Glucose Monitoring Suppl (ACCU-CHEK GUIDE ME) w/Device KIT Please use to check blood sugar level up to four times daily. 1 kit 0   carvedilol  (COREG ) 12.5 MG tablet Take 1 tablet (12.5 mg total) by mouth 2 (two) times daily with a meal. 60 tablet 5   clopidogrel  (PLAVIX ) 75 MG tablet Take 1 tablet (75 mg total) by mouth daily. 90 tablet 1   Continuous Glucose Sensor (FREESTYLE  LIBRE 3 PLUS SENSOR) MISC Change sensor every 15 days. 1 each 3   Continuous Glucose Sensor (FREESTYLE LIBRE 3 SENSOR) MISC Place 1 sensor on the skin every 14 days. Use to check glucose continuously 2 each 11   cyclobenzaprine  (FLEXERIL ) 10 MG tablet Take 0.5 tablets (5 mg total) by mouth 2 (two) times daily as needed for muscle spasms. 10 tablet 0   DROPLET PEN NEEDLES 32G X 4 MM MISC USE 3 TIMES DAILY AS NEEDED BETWEEN MEALS AND BEDTIME 100 each 2   empagliflozin  (JARDIANCE ) 10 MG TABS tablet Take 1 tablet (10 mg total) by mouth daily before breakfast. 30 tablet 5    glucose blood (ACCU-CHEK GUIDE TEST) test strip Please use to check blood sugar up to 4 times daily. 100 each 12   guaiFENesin  (MUCINEX ) 600 MG 12 hr tablet Take 1 tablet (600 mg total) by mouth 2 (two) times daily as needed for to loosen phlegm or cough.     Insulin  Glargine (BASAGLAR  KWIKPEN) 100 UNIT/ML Inject 20 Units into the skin daily.     isosorbide  mononitrate (IMDUR ) 30 MG 24 hr tablet Take 1 tablet (30 mg total) by mouth daily. 90 tablet 1   ondansetron  (ZOFRAN -ODT) 4 MG disintegrating tablet Take 1 tablet (4 mg total) by mouth every 8 (eight) hours as needed for nausea or vomiting.     polyethylene glycol (MIRALAX  / GLYCOLAX ) 17 g packet Take 17 g by mouth 2 (two) times daily.     senna (SENOKOT) 8.6 MG tablet Take 1 tablet by mouth as needed for constipation.     torsemide  (DEMADEX ) 20 MG tablet Take 2 tablets (40 mg total) by mouth daily. 60 tablet 5   Fe Fum-Vit C-Vit B12-FA (TRIGELS-F FORTE PO) Take 1 tablet by mouth every Monday, Wednesday, and Friday.     feeding supplement (ENSURE PLUS HIGH PROTEIN) LIQD Take 237 mLs by mouth 2 (two) times daily between meals.     hydrALAZINE  (APRESOLINE ) 50 MG tablet Take 1.5 tablets (75 mg total) by mouth 3 (three) times daily. 135 tablet 3   lidocaine  (LIDODERM ) 5 % Place 1 patch onto the skin daily. Remove & Discard patch within 12 hours or as directed by MD 14 patch 0   [Paused] potassium chloride  SA (KLOR-CON  M) 20 MEQ tablet Take 1 tablet (20 mEq total) by mouth daily. (Patient not taking: Reported on 02/23/2024) 30 tablet 3   No current facility-administered medications for this encounter.    Allergies  Allergen Reactions   Trulicity [Dulaglutide] Other (See Comments)    Patient indicated this caused pancreatitis.       Social History   Socioeconomic History   Marital status: Married    Spouse name: [name]   Number of children: 4   Years of education: 10   Highest education level: 10th grade  Occupational History    Occupation: retired  Tobacco Use   Smoking status: Former    Current packs/day: 0.00    Average packs/day: 0.5 packs/day for 10.0 years (5.0 ttl pk-yrs)    Types: Cigarettes    Start date: 04/26/1983    Quit date: 04/25/1993    Years since quitting: 30.8    Passive exposure: Past   Smokeless tobacco: Never  Vaping Use   Vaping status: Never Used  Substance and Sexual Activity   Alcohol use: No   Drug use: No   Sexual activity: Yes  Other Topics Concern   Not on file  Social History Narrative  Patient lives with his wife in [place].   Patent has 4 boys, one lives in [place] .    Patient enjoys fishing, camping in the woods, working outside, anything outdoor related.   Social Drivers of Health   Financial Resource Strain: Medium Risk (10/31/2023)   Overall Financial Resource Strain (CARDIA)    Difficulty of Paying Living Expenses: Somewhat hard  Food Insecurity: No Food Insecurity (12/31/2023)   Hunger Vital Sign    Worried About Running Out of Food in the Last Year: Never true    Ran Out of Food in the Last Year: Never true  Recent Concern: Food Insecurity - Food Insecurity Present (10/31/2023)   Hunger Vital Sign    Worried About Running Out of Food in the Last Year: Sometimes true    Ran Out of Food in the Last Year: Sometimes true  Transportation Needs: No Transportation Needs (12/31/2023)   PRAPARE - Administrator, Civil Service (Medical): No    Lack of Transportation (Non-Medical): No  Physical Activity: Inactive (10/31/2023)   Exercise Vital Sign    Days of Exercise per Week: 0 days    Minutes of Exercise per Session: Not on file  Stress: No Stress Concern Present (10/31/2023)   [employer] of Occupational Health - Occupational Stress Questionnaire    Feeling of Stress: Only a little  Social Connections: Moderately Isolated (12/31/2023)   Social Connection and Isolation Panel    Frequency of Communication with Friends and Family: More than three times a week     Frequency of Social Gatherings with Friends and Family: Once a week    Attends Religious Services: Never    Database Administrator or Organizations: No    Attends Banker Meetings: Never    Marital Status: Married  Catering Manager Violence: Not At Risk (12/31/2023)   Humiliation, Afraid, Rape, and Kick questionnaire    Fear of Current or Ex-Partner: No    Emotionally Abused: No    Physically Abused: No    Sexually Abused: No      Family History  Problem Relation Age of Onset   Heart disease Mother    Diabetes Mother    Hypertension Mother    Alcoholism Father    Heart disease Father    Hypertension Sister    Diabetes Brother    Hypercholesterolemia Brother    Osteoporosis Brother    Hypertension Brother    Kidney disease Brother     Vitals:   02/23/24 1541  BP: (!) 144/78  Pulse: 86  SpO2: 96%  Weight: 84.8 kg (187 lb)      PHYSICAL EXAM: General:  No distress. Ambulated into clinic. Cor: No JVD. Regular rate & rhythm. No murmurs. Lungs: clear Abdomen: soft, nontender, nondistended. Extremities: no edema Neuro: alert & orientedx3. Affect pleasant   ASSESSMENT & PLAN:  1. Chronic systolic CHF: Ischemic cardiomyopathy. Echo pre-CABG with EF 35-40%, RV hard to visualize. Difficulty separating from cardiopulmonary bypass following CABG 09/27/23. Required inotropic support for post-cardiotomy shock. Repeat Echo 09/30/23 EF 25-30%, RV not well visualized. Recovered and weaned off pressors.  Echo was reviewed today and showed EF 30% with no LV thrombus, septal-lateral dyssynchrony due to LBBB, mild RV systolic dysfunction, IVC normal.   - EF remains low 3 months post-CABG, he also has dyssynchrony on echo even though LBBB is not markedly wide (130 msec). Refer to EP for consideration of CRT-D.  - NYHA II. He is not volume overloaded. Continue 40 mg  Torsemide  daily. BMET/BNP today. - GDMT limited by CKD - Continue Jardiance  10 mg daily - Increase hydralazine   to 75 mg TID, continue Imdur  30 mg daily - Continue Coreg  12.5 mg BID  - Has been more compliant with Paramedicine assistance. 2. CAD: History of PCI to LAD in 2005.  NSTEMI 09/21/23 with multivessel disease on LHC. S/p CABG X 4 (LIMA to LAD, SVG to OM1, sequential SVG to OM3, PDA) on 09/27/23.  No ischemic chest pain.  - Continue ASA + plavix  75 mg daily - Continue atorva 80 mg daily. LDL 51 in 9/25 - Has been referred to cardiac rehab. 3. CKD IIIb/IV: Developed AKI due ATN post-CABG. SCr rose > 4. He required short term CRRT for clearance and volume removal. Fortunately w/ renal recovery. Creatinine has been stable recently around 2.5.  - BMET today - Continue Jardiance  4. DM II: Per PCP.   Folllowup 6 weeks with APP   Ulric Salzman N, PA-C 02/23/24

## 2024-02-23 NOTE — Patient Instructions (Signed)
 Medication Changes:  INCREASE Hydralazine  to 75 mg (1 & 1/2 tabs) Three times a day   Lab Work:  Labs done today, your results will be available in MyChart, we will contact you for abnormal readings.  Referrals:  You have been referred to EP to discuss getting a defibrillator, they will call you to schedule  Special Instructions // Education:  Do the following things EVERYDAY: Weigh yourself in the morning before breakfast. Write it down and keep it in a log. Take your medicines as prescribed Eat low salt foods--Limit salt (sodium) to 2000 mg per day.  Stay as active as you can everyday Limit all fluids for the day to less than 2 liters   Follow-Up in: 2 months   At the [facility], you and your health needs are our priority. We have a designated team specialized in the treatment of Heart Failure. This Care Team includes your primary Heart Failure Specialized Cardiologist (physician), Advanced Practice Providers (APPs- Physician Assistants and Nurse Practitioners), and Pharmacist who all work together to provide you with the care you need, when you need it.   You may see any of the following providers on your designated Care Team at your next follow up:  Dr. Toribio Fuel Dr. Ezra Shuck Dr. Odis Brownie Greig Mosses, NP Caffie Shed, [place] [facility] [place], [place] Beckey Coe, NP Jordan Lee, NP Tinnie Redman, PharmD   Please be sure to bring in all your medications bottles to every appointment.   Need to Contact Us :  If you have any questions or concerns before your next appointment please send us  a message through [place] or call our office at [phone].    TO LEAVE A MESSAGE FOR THE NURSE SELECT OPTION 2, PLEASE LEAVE A MESSAGE INCLUDING: YOUR NAME DATE OF BIRTH CALL BACK NUMBER REASON FOR CALL**this is important as we prioritize the call backs  YOU WILL RECEIVE A CALL BACK THE SAME DAY AS LONG AS YOU CALL BEFORE 4:00 PM

## 2024-02-26 ENCOUNTER — Ambulatory Visit (HOSPITAL_COMMUNITY): Payer: Self-pay | Admitting: Physician Assistant

## 2024-02-27 ENCOUNTER — Encounter: Payer: Self-pay | Admitting: Student

## 2024-02-27 DIAGNOSIS — I359 Nonrheumatic aortic valve disorder, unspecified: Secondary | ICD-10-CM | POA: Diagnosis not present

## 2024-02-27 DIAGNOSIS — M519 Unspecified thoracic, thoracolumbar and lumbosacral intervertebral disc disorder: Secondary | ICD-10-CM | POA: Diagnosis not present

## 2024-02-27 DIAGNOSIS — I700 Atherosclerosis of aorta: Secondary | ICD-10-CM | POA: Diagnosis not present

## 2024-02-27 DIAGNOSIS — E11311 Type 2 diabetes mellitus with unspecified diabetic retinopathy with macular edema: Secondary | ICD-10-CM | POA: Diagnosis not present

## 2024-02-27 DIAGNOSIS — I429 Cardiomyopathy, unspecified: Secondary | ICD-10-CM | POA: Diagnosis not present

## 2024-02-27 DIAGNOSIS — M545 Low back pain, unspecified: Secondary | ICD-10-CM | POA: Diagnosis not present

## 2024-02-27 DIAGNOSIS — I454 Nonspecific intraventricular block: Secondary | ICD-10-CM | POA: Diagnosis not present

## 2024-02-27 DIAGNOSIS — Z833 Family history of diabetes mellitus: Secondary | ICD-10-CM | POA: Diagnosis not present

## 2024-02-27 DIAGNOSIS — I2510 Atherosclerotic heart disease of native coronary artery without angina pectoris: Secondary | ICD-10-CM

## 2024-02-27 DIAGNOSIS — Z7984 Long term (current) use of oral hypoglycemic drugs: Secondary | ICD-10-CM | POA: Diagnosis not present

## 2024-02-27 DIAGNOSIS — I255 Ischemic cardiomyopathy: Secondary | ICD-10-CM | POA: Diagnosis not present

## 2024-02-27 DIAGNOSIS — I25119 Atherosclerotic heart disease of native coronary artery with unspecified angina pectoris: Secondary | ICD-10-CM | POA: Diagnosis not present

## 2024-02-27 DIAGNOSIS — I252 Old myocardial infarction: Secondary | ICD-10-CM | POA: Diagnosis not present

## 2024-02-27 DIAGNOSIS — Z87891 Personal history of nicotine dependence: Secondary | ICD-10-CM | POA: Diagnosis not present

## 2024-02-27 DIAGNOSIS — I130 Hypertensive heart and chronic kidney disease with heart failure and stage 1 through stage 4 chronic kidney disease, or unspecified chronic kidney disease: Secondary | ICD-10-CM | POA: Diagnosis not present

## 2024-02-27 DIAGNOSIS — E1136 Type 2 diabetes mellitus with diabetic cataract: Secondary | ICD-10-CM | POA: Diagnosis not present

## 2024-02-27 DIAGNOSIS — E1165 Type 2 diabetes mellitus with hyperglycemia: Secondary | ICD-10-CM | POA: Diagnosis not present

## 2024-02-27 DIAGNOSIS — N184 Chronic kidney disease, stage 4 (severe): Secondary | ICD-10-CM | POA: Diagnosis not present

## 2024-02-27 DIAGNOSIS — I509 Heart failure, unspecified: Secondary | ICD-10-CM | POA: Diagnosis not present

## 2024-02-27 DIAGNOSIS — E1122 Type 2 diabetes mellitus with diabetic chronic kidney disease: Secondary | ICD-10-CM | POA: Diagnosis not present

## 2024-02-28 MED ORDER — NITROGLYCERIN 0.3 MG SL SUBL: 0.3000 mg | SUBLINGUAL_TABLET | SUBLINGUAL | 0 refills | Status: AC | PRN

## 2024-03-07 ENCOUNTER — Other Ambulatory Visit (HOSPITAL_COMMUNITY): Payer: Self-pay

## 2024-03-11 NOTE — Progress Notes (Signed)
 Paramedicine Encounter    Patient ID: [name], male    DOB: 1950-05-09, 73 y.o.   MRN: [id number]  Arrived at pts  home for our sch visit. Family member answered the door and advised he was not home but should be home soon.  I sent him message ref time and no answer- I left after waiting for approx .  He reached back out later on this afternoon and will resch for next week.    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   Type 2 diabetes mellitus with hyperglycemia, with long-term current use of insulin  (HCC) 09/23/2023   Ischemic heart disease due to coronary artery obstruction 09/22/2023   Type 2 diabetes mellitus with diabetic chronic kidney disease (HCC) 09/21/2023   Coronary artery disease involving native coronary artery of native heart without angina pectoris    Hypercholesterolemia    Low-level of literacy 05/21/2018   Globus sensation 03/12/2018   Essential hypertension, benign 06/09/2017    Current Outpatient Medications:    Accu-Chek Softclix Lancets lancets, USE AS DIRECTED UP TO FOUR TIMES DAILY, Disp: 100 each, Rfl: 12   aspirin  EC 81 MG tablet, Take 81 mg by mouth daily., Disp: , Rfl:    atorvastatin  (LIPITOR ) 80 MG tablet, Take 1 tablet (80 mg total) by mouth daily., Disp: 90 tablet, Rfl: 0   blood glucose meter kit and supplies, Dispense based on patient and insurance preference. Use up to four times daily as directed. (FOR ICD-10 E10.9, E11.9)., Disp: 1 each, Rfl: 0   Blood Glucose Monitoring Suppl (ACCU-CHEK GUIDE ME)  w/Device KIT, Please use to check blood sugar level up to four times daily., Disp: 1 kit, Rfl: 0   carvedilol  (COREG ) 12.5 MG tablet, Take 1 tablet (12.5 mg total) by mouth 2 (two) times daily with a meal., Disp: 60 tablet, Rfl: 5   clopidogrel  (PLAVIX ) 75 MG tablet, Take 1 tablet (75 mg total) by mouth daily., Disp: 90 tablet, Rfl: 1   Continuous Glucose Sensor (FREESTYLE LIBRE 3 PLUS SENSOR) MISC, Change sensor every 15 days., Disp: 1 each, Rfl: 3   Continuous Glucose Sensor (FREESTYLE LIBRE 3 SENSOR) MISC, Place 1 sensor on the skin every 14 days. Use to check glucose continuously, Disp: 2 each, Rfl: 11   cyclobenzaprine  (FLEXERIL ) 10 MG tablet, Take 0.5 tablets (5 mg total) by mouth 2 (two) times daily as needed for muscle spasms., Disp: 10 tablet, Rfl: 0   DROPLET PEN NEEDLES 32G X 4 MM MISC, USE 3 TIMES DAILY AS NEEDED BETWEEN MEALS AND BEDTIME, Disp: 100 each, Rfl: 2   empagliflozin  (JARDIANCE ) 10 MG TABS tablet, Take 1 tablet (10 mg total) by mouth daily before breakfast., Disp: 30 tablet, Rfl: 5   Fe Fum-Vit C-Vit B12-FA (TRIGELS-F FORTE PO), Take 1 tablet by mouth every Monday, Wednesday, and Friday., Disp: , Rfl:    feeding supplement (ENSURE PLUS HIGH PROTEIN) LIQD, Take 237 mLs by mouth 2 (two) times daily between meals., Disp: , Rfl:    glucose blood (ACCU-CHEK GUIDE TEST) test strip, Please use to check  blood sugar up to 4 times daily., Disp: 100 each, Rfl: 12   guaiFENesin  (MUCINEX ) 600 MG 12 hr tablet, Take 1 tablet (600 mg total) by mouth 2 (two) times daily as needed for to loosen phlegm or cough., Disp: , Rfl:    hydrALAZINE  (APRESOLINE ) 50 MG tablet, Take 1.5 tablets (75 mg total) by mouth 3 (three) times daily., Disp: 135 tablet, Rfl: 3   Insulin  Glargine (BASAGLAR  KWIKPEN) 100 UNIT/ML, Inject 20 Units into the skin daily., Disp: , Rfl:    isosorbide  mononitrate (IMDUR ) 30 MG 24 hr tablet, Take 1 tablet (30 mg total) by mouth daily., Disp: 90 tablet, Rfl: 1   lidocaine  (LIDODERM )  5 %, Place 1 patch onto the skin daily. Remove & Discard patch within 12 hours or as directed by MD, Disp: 14 patch, Rfl: 0   nitroGLYCERIN  (NITROSTAT ) 0.3 MG SL tablet, Place 1 tablet (0.3 mg total) under the tongue every 5 (five) minutes as needed for chest pain., Disp: 90 tablet, Rfl: 0   ondansetron  (ZOFRAN -ODT) 4 MG disintegrating tablet, Take 1 tablet (4 mg total) by mouth every 8 (eight) hours as needed for nausea or vomiting., Disp: , Rfl:    polyethylene glycol (MIRALAX  / GLYCOLAX ) 17 g packet, Take 17 g by mouth 2 (two) times daily., Disp: , Rfl:    [Paused] potassium chloride  SA (KLOR-CON  M) 20 MEQ tablet, Take 1 tablet (20 mEq total) by mouth daily. (Patient not taking: Reported on 02/23/2024), Disp: 30 tablet, Rfl: 3   senna (SENOKOT) 8.6 MG tablet, Take 1 tablet by mouth as needed for constipation., Disp: , Rfl:    torsemide  (DEMADEX ) 20 MG tablet, Take 2 tablets (40 mg total) by mouth daily., Disp: 60 tablet, Rfl: 5 Allergies  Allergen Reactions   Trulicity [Dulaglutide] Other (See Comments)    Patient indicated this caused pancreatitis.       Social History   Socioeconomic History   Marital status: Married    Spouse name: [name]   Number of children: 4   Years of education: 10   Highest education level: 10th grade  Occupational History   Occupation: retired  Tobacco Use   Smoking status: Former    Current packs/day: 0.00    Average packs/day: 0.5 packs/day for 10.0 years (5.0 ttl pk-yrs)    Types: Cigarettes    Start date: 04/26/1983    Quit date: 04/25/1993    Years since quitting: 30.8    Passive exposure: Past   Smokeless tobacco: Never  Vaping Use   Vaping status: Never Used  Substance and Sexual Activity   Alcohol use: No   Drug use: No   Sexual activity: Yes  Other Topics Concern   Not on file  Social History Narrative   Patient lives with his wife in [place].   Patent has 4 boys, one lives in [place] .    Patient enjoys fishing, camping in the woods,  working outside, anything outdoor related.   Social Drivers of Health   Financial Resource Strain: Medium Risk (10/31/2023)   Overall Financial Resource Strain (CARDIA)    Difficulty of Paying Living Expenses: Somewhat hard  Food Insecurity: No Food Insecurity (12/31/2023)   Hunger Vital Sign    Worried About Running Out of Food in the Last Year: Never true    Ran Out of Food in the Last Year: Never true  Recent Concern: Food Insecurity - Food Insecurity Present (10/31/2023)   Hunger Vital Sign    Worried About Running Out of  Food in the Last Year: Sometimes true    Ran Out of Food in the Last Year: Sometimes true  Transportation Needs: No Transportation Needs (12/31/2023)   PRAPARE - Administrator, Civil Service (Medical): No    Lack of Transportation (Non-Medical): No  Physical Activity: Inactive (10/31/2023)   Exercise Vital Sign    Days of Exercise per Week: 0 days    Minutes of Exercise per Session: Not on file  Stress: No Stress Concern Present (10/31/2023)   [employer] of Occupational Health - Occupational Stress Questionnaire    Feeling of Stress: Only a little  Social Connections: Moderately Isolated (12/31/2023)   Social Connection and Isolation Panel    Frequency of Communication with Friends and Family: More than three times a week    Frequency of Social Gatherings with Friends and Family: Once a week    Attends Religious Services: Never    Database Administrator or Organizations: No    Attends Banker Meetings: Never    Marital Status: Married  Catering Manager Violence: Not At Risk (12/31/2023)   Humiliation, Afraid, Rape, and Kick questionnaire    Fear of Current or Ex-Partner: No    Emotionally Abused: No    Physically Abused: No    Sexually Abused: No    Physical Exam      Future Appointments  Date Time Provider Department Center  03/14/2024  2:30 PM Amalia Maude MATSU R[facility]-CPP [facility]  04/10/2024  3:00 PM Inocencio Soyla Lunger, MD  [facility]  04/12/2024  1:30 PM [facility] PA/NP [facility] None  08/26/2024  2:20 PM [facility] ANNUAL WELLNESS VISIT [facility]       Izetta Quivers, Paramedic [phone] [facility] Paramedic  03/07/2024

## 2024-03-12 ENCOUNTER — Other Ambulatory Visit (HOSPITAL_COMMUNITY): Payer: Self-pay

## 2024-03-12 NOTE — Progress Notes (Signed)
 Paramedicine Encounter    Patient ID: Epic Tribbett, male    DOB: July 10, 1950, 73 y.o.   MRN: [id number]   Complaints-denies   Edema-none   Compliance with meds-yes  Pill box filled-yes  If so, by whom-wife -she handles meds and does a great job-keeps a notebook with all changes and v/s listed daily   Refills needed-none    Pt reports he is doing good. He denies increased sob,no dizziness, no c/p. Back is healing slowly and slowly feeling better. No edema noted.  At last clinic visit his hydralazine  was increased, his home b/p have truly been all over the place from the 110s to 150s systolic.  He is due for his mid-day dose of hydralazine  in another hour and half or so.  Wife manages his meds. Keeps great records of everything.  He sees EP next month ref a pacer/ICD. He was asking questions and I answered best I could. I explained to him situations in which it would save his life-he is leaning to going thru with it but he is afraid of the surgery. I told him they didn't need an answer today and any questions to write down to take with them to ask during the visit and just be sure to go to that appointment and hear what the doc says.   Will continue to f/u.   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   Type 2 diabetes mellitus with hyperglycemia, with long-term current use of insulin  (HCC)  09/23/2023   Ischemic heart disease due to coronary artery obstruction 09/22/2023   Type 2 diabetes mellitus with diabetic chronic kidney disease (HCC) 09/21/2023   Coronary artery disease involving native coronary artery of native heart without angina pectoris    Hypercholesterolemia    Low-level of literacy 05/21/2018   Globus sensation 03/12/2018   Essential hypertension, benign 06/09/2017    Current Outpatient Medications:    Accu-Chek Softclix Lancets lancets, USE AS DIRECTED UP TO FOUR TIMES DAILY, Disp: 100 each, Rfl: 12   aspirin  EC 81 MG tablet, Take 81 mg by mouth daily., Disp: , Rfl:    atorvastatin  (LIPITOR ) 80 MG tablet, Take 1 tablet (80 mg total) by mouth daily., Disp: 90 tablet, Rfl: 0   blood glucose meter kit and supplies, Dispense based on patient and insurance preference. Use up to four times daily as directed. (FOR ICD-10 E10.9, E11.9)., Disp: 1 each, Rfl: 0   Blood Glucose Monitoring Suppl (ACCU-CHEK GUIDE ME) w/Device KIT, Please use to check blood sugar level up to four times daily., Disp: 1 kit, Rfl: 0   carvedilol  (COREG ) 12.5 MG tablet, Take 1 tablet (12.5 mg total) by mouth 2 (two) times daily with a meal., Disp: 60 tablet, Rfl: 5   clopidogrel  (PLAVIX ) 75 MG tablet, Take 1 tablet (75 mg total) by mouth daily., Disp: 90 tablet, Rfl:  1   Continuous Glucose Sensor (FREESTYLE LIBRE 3 PLUS SENSOR) MISC, Change sensor every 15 days., Disp: 1 each, Rfl: 3   Continuous Glucose Sensor (FREESTYLE LIBRE 3 SENSOR) MISC, Place 1 sensor on the skin every 14 days. Use to check glucose continuously, Disp: 2 each, Rfl: 11   cyclobenzaprine  (FLEXERIL ) 10 MG tablet, Take 0.5 tablets (5 mg total) by mouth 2 (two) times daily as needed for muscle spasms., Disp: 10 tablet, Rfl: 0   DROPLET PEN NEEDLES 32G X 4 MM MISC, USE 3 TIMES DAILY AS NEEDED BETWEEN MEALS AND BEDTIME, Disp: 100 each, Rfl: 2   empagliflozin  (JARDIANCE ) 10 MG TABS tablet, Take 1 tablet (10 mg total) by mouth daily  before breakfast., Disp: 30 tablet, Rfl: 5   Fe Fum-Vit C-Vit B12-FA (TRIGELS-F FORTE PO), Take 1 tablet by mouth every Monday, Wednesday, and Friday., Disp: , Rfl:    feeding supplement (ENSURE PLUS HIGH PROTEIN) LIQD, Take 237 mLs by mouth 2 (two) times daily between meals., Disp: , Rfl:    glucose blood (ACCU-CHEK GUIDE TEST) test strip, Please use to check blood sugar up to 4 times daily., Disp: 100 each, Rfl: 12   guaiFENesin  (MUCINEX ) 600 MG 12 hr tablet, Take 1 tablet (600 mg total) by mouth 2 (two) times daily as needed for to loosen phlegm or cough., Disp: , Rfl:    hydrALAZINE  (APRESOLINE ) 50 MG tablet, Take 1.5 tablets (75 mg total) by mouth 3 (three) times daily., Disp: 135 tablet, Rfl: 3   Insulin  Glargine (BASAGLAR  KWIKPEN) 100 UNIT/ML, Inject 20 Units into the skin daily., Disp: , Rfl:    isosorbide  mononitrate (IMDUR ) 30 MG 24 hr tablet, Take 1 tablet (30 mg total) by mouth daily., Disp: 90 tablet, Rfl: 1   lidocaine  (LIDODERM ) 5 %, Place 1 patch onto the skin daily. Remove & Discard patch within 12 hours or as directed by MD, Disp: 14 patch, Rfl: 0   nitroGLYCERIN  (NITROSTAT ) 0.3 MG SL tablet, Place 1 tablet (0.3 mg total) under the tongue every 5 (five) minutes as needed for chest pain., Disp: 90 tablet, Rfl: 0   ondansetron  (ZOFRAN -ODT) 4 MG disintegrating tablet, Take 1 tablet (4 mg total) by mouth every 8 (eight) hours as needed for nausea or vomiting., Disp: , Rfl:    polyethylene glycol (MIRALAX  / GLYCOLAX ) 17 g packet, Take 17 g by mouth 2 (two) times daily., Disp: , Rfl:    [Paused] potassium chloride  SA (KLOR-CON  M) 20 MEQ tablet, Take 1 tablet (20 mEq total) by mouth daily. (Patient not taking: Reported on 02/23/2024), Disp: 30 tablet, Rfl: 3   senna (SENOKOT) 8.6 MG tablet, Take 1 tablet by mouth as needed for constipation., Disp: , Rfl:    torsemide  (DEMADEX ) 20 MG tablet, Take 2 tablets (40 mg total) by mouth daily., Disp: 60 tablet, Rfl: 5 Allergies  Allergen Reactions    Trulicity [Dulaglutide] Other (See Comments)    Patient indicated this caused pancreatitis.       Social History   Socioeconomic History   Marital status: Married    Spouse name: [name]   Number of children: 4   Years of education: 10   Highest education level: 10th grade  Occupational History   Occupation: retired  Tobacco Use   Smoking status: Former    Current packs/day: 0.00    Average packs/day: 0.5 packs/day for 10.0 years (5.0 ttl pk-yrs)    Types: Cigarettes    Start date: 04/26/1983    Quit date:  04/25/1993    Years since quitting: 30.9    Passive exposure: Past   Smokeless tobacco: Never  Vaping Use   Vaping status: Never Used  Substance and Sexual Activity   Alcohol use: No   Drug use: No   Sexual activity: Yes  Other Topics Concern   Not on file  Social History Narrative   Patient lives with his wife in [place].   Patent has 4 boys, one lives in [place] .    Patient enjoys fishing, camping in the woods, working outside, anything outdoor related.   Social Drivers of Health   Financial Resource Strain: Medium Risk (10/31/2023)   Overall Financial Resource Strain (CARDIA)    Difficulty of Paying Living Expenses: Somewhat hard  Food Insecurity: No Food Insecurity (12/31/2023)   Hunger Vital Sign    Worried About Running Out of Food in the Last Year: Never true    Ran Out of Food in the Last Year: Never true  Recent Concern: Food Insecurity - Food Insecurity Present (10/31/2023)   Hunger Vital Sign    Worried About Running Out of Food in the Last Year: Sometimes true    Ran Out of Food in the Last Year: Sometimes true  Transportation Needs: No Transportation Needs (12/31/2023)   PRAPARE - Administrator, Civil Service (Medical): No    Lack of Transportation (Non-Medical): No  Physical Activity: Inactive (10/31/2023)   Exercise Vital Sign    Days of Exercise per Week: 0 days    Minutes of Exercise per Session: Not on file  Stress: No Stress Concern  Present (10/31/2023)   [employer] of Occupational Health - Occupational Stress Questionnaire    Feeling of Stress: Only a little  Social Connections: Moderately Isolated (12/31/2023)   Social Connection and Isolation Panel    Frequency of Communication with Friends and Family: More than three times a week    Frequency of Social Gatherings with Friends and Family: Once a week    Attends Religious Services: Never    Database Administrator or Organizations: No    Attends Banker Meetings: Never    Marital Status: Married  Catering Manager Violence: Not At Risk (12/31/2023)   Humiliation, Afraid, Rape, and Kick questionnaire    Fear of Current or Ex-Partner: No    Emotionally Abused: No    Physically Abused: No    Sexually Abused: No    Physical Exam      Future Appointments  Date Time Provider Department Center  03/14/2024  2:30 PM Amalia Maude MATSU [facility]CPP [facility]  04/10/2024  3:00 PM Inocencio Soyla Lunger, MD [facility]  04/12/2024  1:30 PM [facility] PA/NP [facility] None  08/26/2024  2:20 PM [facility] ANNUAL WELLNESS VISIT [facility]       Izetta Quivers, Paramedic [phone] [facility] Paramedic  03/12/24

## 2024-03-14 ENCOUNTER — Ambulatory Visit: Admitting: Pharmacist

## 2024-03-14 ENCOUNTER — Encounter: Payer: Self-pay | Admitting: Pharmacist

## 2024-03-14 ENCOUNTER — Telehealth: Payer: Self-pay

## 2024-03-14 VITALS — BP 128/72 | HR 74 | Wt 192.8 lb

## 2024-03-14 DIAGNOSIS — I10 Essential (primary) hypertension: Secondary | ICD-10-CM | POA: Diagnosis not present

## 2024-03-14 DIAGNOSIS — E7800 Pure hypercholesterolemia, unspecified: Secondary | ICD-10-CM

## 2024-03-14 DIAGNOSIS — Z794 Long term (current) use of insulin: Secondary | ICD-10-CM | POA: Diagnosis not present

## 2024-03-14 DIAGNOSIS — E1165 Type 2 diabetes mellitus with hyperglycemia: Secondary | ICD-10-CM

## 2024-03-14 DIAGNOSIS — E119 Type 2 diabetes mellitus without complications: Secondary | ICD-10-CM

## 2024-03-14 MED ORDER — BASAGLAR KWIKPEN 100 UNIT/ML ~~LOC~~ SOPN
30.0000 [IU] | PEN_INJECTOR | Freq: Every day | SUBCUTANEOUS | 3 refills | Status: AC
Start: 2024-03-14 — End: ?

## 2024-03-14 NOTE — Telephone Encounter (Signed)
 Current enrollment with [employer] ends 04/24/24 for Basaglar  patient assistance.

## 2024-03-14 NOTE — Assessment & Plan Note (Signed)
 Hypertension longstanding currently controlled. Blood pressure goal of <130/80 mmHg. Medication adherence good.  -Continued carvedilol  12.5 mg BID, hydralazine  75 mg TID, isosorbide  30 mg daily, torsemide  20 mg daily

## 2024-03-14 NOTE — Progress Notes (Signed)
 S:     Chief Complaint  Patient presents with   Medication Management    DM Follow up   73 y.o. male who presents for diabetes evaluation, education, and management. Patient arrives in good spirits and presents without any assistance. Patient is accompanied by his wife.   Patient was referred and last seen by Primary Care Provider, Dr. Cleotilde, on 01/16/24.   PMH is significant for T2DM with Chronic Kidney Disease, hyperlipidemia, rib fracture, hypertension, CAD.   Current diabetes medications include: Basaglar  (insulin  glargine) 24 units daily, Jardiance  (empagliflozin ) 10 mg daily  Current hypertension medications include: carvedilol  12.5 mg BID, hydralazine  75 mg TID, isosorbide  30 mg daily, torsemide  20 mg daily Current hyperlipidemia medications include: atorvastatin  80 mg daily Patient reports adherence to taking all medications as prescribed.   Insurance coverage: Medicare  Patient reports hypoglycemic events happened a couple of time after he discharged from hospital in June, no hypoglycemic symptoms since then.   Patient reports nocturia (nighttime urination). Patient thinks it is due to his torsemide .  Patient denies neuropathy (nerve pain). Patient reports visual changes. Reported he is unsure if this is due to his glasses. Patient reports self foot exams.   Patient reported dietary habits: Eats 1 meal/day and one snacks at night. Reported increased appetite recently. Denied eating sweets.  Lunch: coffee Dinner: meat, vegetable, rice/potatoes. Wife endorsed portion control.  Drinks: orange juice sometimes   Patient-reported exercise habits: Not a lots. Broke his back and had open heart surgery in the past so activities have been restricted.   O:  Review of Systems  All other systems reviewed and are negative.  Physical Exam Constitutional:      Appearance: Normal appearance.  Pulmonary:     Effort: Pulmonary effort is normal.  Neurological:     Mental  Status: He is alert.  Psychiatric:        Mood and Affect: Mood normal.        Behavior: Behavior normal.        Thought Content: Thought content normal.        Judgment: Judgment normal.   Libre3 CGM Download today 03/01/24-03/14/24 % Time CGM is active: 96% Average Glucose: 257 mg/dL Glucose Management Indicator: 9.5%  Glucose Variability: 27.3% (goal <36%) Time in Goal:  - Time in range 70-180: 16% - Time above range: 84% - Time below range: 0%  Lab Results  Component Value Date   HGBA1C 7.8 (A) 12/19/2023   Vitals:   03/14/24 1433  BP: 128/72  Pulse: 74    Lipid Panel     Component Value Date/Time   CHOL 129 12/26/2023 1554   CHOL 171 12/19/2023 1500   TRIG 155 (H) 12/26/2023 1554   HDL 47 12/26/2023 1554   HDL 54 12/19/2023 1500   CHOLHDL 2.7 12/26/2023 1554   VLDL 31 12/26/2023 1554   LDLCALC 51 12/26/2023 1554   LDLCALC 89 12/19/2023 1500   LDLDIRECT 56 09/10/2020 0947   A/P: Diabetes longstanding currently uncontrolled. Most recent GMI is 9.5% (increased from A1C in August at 7.8) Medication adherence appears good. Control is suboptimal due to suboptimal regimen. -Increased dose of basal insulin  Basaglar  (insulin  glargine) (insulin  glargine) to 30 units daily. Counseled patient to call the office if blood glucose dropped below 100.  -Continued SGLT2-I Jardiance  (empagliflozin ) 10 mg.  -Extensively discussed pathophysiology of diabetes, recommended lifestyle interventions, dietary effects on blood sugar control.   ASCVD risk - secondary prevention in patient with diabetes. Last  LDL is 51 at goal of <70 mg/dL. High intensity statin indicated.  -Continued atorvastatin  80 mg.   Hypertension longstanding currently controlled. Blood pressure goal of <130/80 mmHg. Medication adherence good.  -Continued carvedilol  12.5 mg BID, hydralazine  75 mg TID, isosorbide  30 mg daily, torsemide  20 mg daily   Written patient instructions provided. Patient verbalized  understanding of treatment plan.  Total time in face to face counseling 30 minutes.    Follow-up:  Pharmacist 04/11/24 Patient seen with Lawson Mao, PharmD Candidate - PY3 student and Recardo [employer] PharmD - PY4 Candidate.

## 2024-03-14 NOTE — Patient Instructions (Addendum)
 It was nice to see you today!  Your goal blood sugar is 80-130 before eating and less than 180 after eating.  Medication Changes: Increase Basaglar  (insulin  glargine) to 30 units daily. Please call the office if you have blood glucose less than 100  Continue all other medication the same.   Monitor blood sugars at home and keep a log (glucometer or piece of paper) to bring with you to your next visit.  Keep up the good work with diet and exercise. Aim for a diet full of vegetables, fruit and lean meats (chicken, turkey, fish). Try to limit salt intake by eating fresh or frozen vegetables (instead of canned), rinse canned vegetables prior to cooking and do not add any additional salt to meals.

## 2024-03-14 NOTE — Assessment & Plan Note (Signed)
 Diabetes longstanding currently uncontrolled. Most recent GMI is 9.5% (increased from A1C in August at 7.8) Medication adherence appears good. Control is suboptimal due to suboptimal regimen. -Increased dose of basal insulin  Basaglar  (insulin  glargine) to 30 units daily. Counseled patient to call the office if blood glucose dropped below 100.  -Continued SGLT2-I Jardiance  (empagliflozin ) 10 mg.  -Extensively discussed pathophysiology of diabetes, recommended lifestyle interventions, dietary effects on blood sugar control.

## 2024-03-14 NOTE — Assessment & Plan Note (Signed)
 ASCVD risk - secondary prevention in patient with diabetes. Last LDL is 51 at goal of <70 mg/dL. High intensity statin indicated.  -Continued atorvastatin  80 mg.

## 2024-03-15 NOTE — Progress Notes (Signed)
 Reviewed and agree with Dr Rennis plan.

## 2024-03-16 ENCOUNTER — Encounter: Payer: Self-pay | Admitting: Student

## 2024-03-19 ENCOUNTER — Telehealth: Payer: Self-pay | Admitting: Student

## 2024-03-19 DIAGNOSIS — I2510 Atherosclerotic heart disease of native coronary artery without angina pectoris: Secondary | ICD-10-CM | POA: Diagnosis not present

## 2024-03-19 DIAGNOSIS — E1122 Type 2 diabetes mellitus with diabetic chronic kidney disease: Secondary | ICD-10-CM | POA: Diagnosis not present

## 2024-03-19 DIAGNOSIS — N1832 Chronic kidney disease, stage 3b: Secondary | ICD-10-CM | POA: Diagnosis not present

## 2024-03-19 DIAGNOSIS — I130 Hypertensive heart and chronic kidney disease with heart failure and stage 1 through stage 4 chronic kidney disease, or unspecified chronic kidney disease: Secondary | ICD-10-CM | POA: Diagnosis not present

## 2024-03-19 DIAGNOSIS — I5022 Chronic systolic (congestive) heart failure: Secondary | ICD-10-CM | POA: Diagnosis not present

## 2024-03-19 NOTE — Telephone Encounter (Signed)
 Patient dropped off form at front desk for renew basaqular from [facility].  Verified that patient section of form has been completed.  Last DOS/WCC with PCP was 03/14/2024.  Placed form in green team folder to be completed by clinical staff.  [name]

## 2024-03-20 NOTE — Telephone Encounter (Signed)
 Reviewed form. Placed in PCP's box to be completed.  Christ Courier, CMA

## 2024-03-26 NOTE — Telephone Encounter (Signed)
 Patient dropped off completed application.   Provider completed their portion.   Re-enrollment application for Basaglar  Kwikpens submitted to [employer] via fax.

## 2024-04-01 NOTE — Telephone Encounter (Signed)
 PAP: Patient assistance application for Basaglar  has been approved by PAP Companies: [employer] from 04/25/24 to 04/24/25.   Medication should be delivered to: Home.   For further shipping updates, please contact [employer] at [phone].

## 2024-04-02 ENCOUNTER — Other Ambulatory Visit: Payer: Self-pay | Admitting: Student

## 2024-04-02 ENCOUNTER — Other Ambulatory Visit (HOSPITAL_COMMUNITY): Payer: Self-pay

## 2024-04-02 DIAGNOSIS — I2510 Atherosclerotic heart disease of native coronary artery without angina pectoris: Secondary | ICD-10-CM

## 2024-04-02 NOTE — Progress Notes (Signed)
 Paramedicine Encounter    Patient ID: [name], male    DOB: 08/03/1950, 73 y.o.   MRN: [id number]   Complaints-back pain from accident   Edema-none   Compliance with meds-yes  Pill box filled-wife does If so, by whom-wife  Refills needed-n/a   Pt reports he is doing ok. He denies increased sob, no dizziness no c/p.  He has back pain and it worsens with the cold weather. Following with PCP on this.  Appetite ok.  Wife reports his kidney doc increased his carvedilol  to 25mg  BID on 11/25 due to irregular heart beat she heard.  No EKG was done at that time.  Wife is trying to log into his myfollow with them to see if she can see his lab results.   No bleeding issues.  Reports his b/ps are doing better-todays b/p is elevated slightly.    CBG-166  BP (!) 142/70   Pulse 84   Resp 16   SpO2 97%  Weight yesterday-191 Last visit weight-189  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   Type 2 diabetes mellitus with hyperglycemia, with long-term current use of insulin  (HCC) 09/23/2023   Ischemic heart disease due to coronary artery obstruction 09/22/2023   Type 2 diabetes mellitus with diabetic chronic kidney disease (HCC) 09/21/2023   Coronary artery disease involving native coronary artery of native heart without angina pectoris    Hypercholesterolemia    Low-level of literacy 05/21/2018   Globus sensation 03/12/2018   Essential hypertension, benign 06/09/2017    Current Outpatient Medications:    Accu-Chek Softclix Lancets lancets,  USE AS DIRECTED UP TO FOUR TIMES DAILY, Disp: 100 each, Rfl: 12   aspirin  EC 81 MG tablet, Take 81 mg by mouth daily., Disp: , Rfl:    atorvastatin  (LIPITOR ) 80 MG tablet, Take 1 tablet (80 mg total) by mouth daily., Disp: 90 tablet, Rfl: 0   blood glucose meter kit and supplies, Dispense based on patient and insurance preference. Use up to four times daily as directed. (FOR ICD-10 E10.9, E11.9)., Disp: 1 each, Rfl: 0   Blood Glucose Monitoring Suppl (ACCU-CHEK GUIDE ME) w/Device KIT, Please use to check blood sugar level up to four times daily., Disp: 1 kit, Rfl: 0   carvedilol  (COREG ) 12.5 MG tablet, Take 1 tablet (12.5 mg total) by mouth 2 (two) times daily with a meal. (Patient taking differently: Take 25 mg by mouth 2 (two) times daily with a meal.), Disp: 60 tablet, Rfl: 5   clopidogrel  (PLAVIX ) 75 MG tablet, TAKE 1 TABLET(75 MG) BY MOUTH DAILY, Disp: 90 tablet, Rfl: 1   Continuous Glucose Sensor (FREESTYLE LIBRE 3 PLUS SENSOR) MISC, Change sensor every 15 days., Disp: 1 each, Rfl: 3   Continuous Glucose Sensor (FREESTYLE LIBRE 3 SENSOR) MISC, Place 1 sensor on the skin every 14 days. Use to check glucose continuously, Disp: 2 each, Rfl: 11   cyclobenzaprine  (FLEXERIL ) 10 MG tablet, Take 0.5 tablets (5 mg total) by mouth 2 (two) times daily as needed for muscle spasms., Disp: 10 tablet, Rfl: 0   DROPLET  PEN NEEDLES 32G X 4 MM MISC, USE 3 TIMES DAILY AS NEEDED BETWEEN MEALS AND BEDTIME, Disp: 100 each, Rfl: 2   empagliflozin  (JARDIANCE ) 10 MG TABS tablet, Take 1 tablet (10 mg total) by mouth daily before breakfast., Disp: 30 tablet, Rfl: 5   guaiFENesin  (MUCINEX ) 600 MG 12 hr tablet, Take 1 tablet (600 mg total) by mouth 2 (two) times daily as needed for to loosen phlegm or cough., Disp: , Rfl:    hydrALAZINE  (APRESOLINE ) 50 MG tablet, Take 1.5 tablets (75 mg total) by mouth 3 (three) times daily., Disp: 135 tablet, Rfl: 3   Insulin  Glargine (BASAGLAR  KWIKPEN) 100 UNIT/ML, Inject 30 Units into the  skin daily., Disp: 15 mL, Rfl: 3   isosorbide  mononitrate (IMDUR ) 30 MG 24 hr tablet, Take 1 tablet (30 mg total) by mouth daily., Disp: 90 tablet, Rfl: 1   torsemide  (DEMADEX ) 20 MG tablet, Take 2 tablets (40 mg total) by mouth daily., Disp: 60 tablet, Rfl: 5   Fe Fum-Vit C-Vit B12-FA (TRIGELS-F FORTE PO), Take 1 tablet by mouth every Monday, Wednesday, and Friday. (Patient not taking: Reported on 03/14/2024), Disp: , Rfl:    feeding supplement (ENSURE PLUS HIGH PROTEIN) LIQD, Take 237 mLs by mouth 2 (two) times daily between meals. (Patient not taking: Reported on 03/14/2024), Disp: , Rfl:    glucose blood (ACCU-CHEK GUIDE TEST) test strip, Please use to check blood sugar up to 4 times daily., Disp: 100 each, Rfl: 12   lidocaine  (LIDODERM ) 5 %, Place 1 patch onto the skin daily. Remove & Discard patch within 12 hours or as directed by MD, Disp: 14 patch, Rfl: 0   nitroGLYCERIN  (NITROSTAT ) 0.3 MG SL tablet, Place 1 tablet (0.3 mg total) under the tongue every 5 (five) minutes as needed for chest pain., Disp: 90 tablet, Rfl: 0   ondansetron  (ZOFRAN -ODT) 4 MG disintegrating tablet, Take 1 tablet (4 mg total) by mouth every 8 (eight) hours as needed for nausea or vomiting. (Patient not taking: Reported on 03/14/2024), Disp: , Rfl:    polyethylene glycol (MIRALAX  / GLYCOLAX ) 17 g packet, Take 17 g by mouth 2 (two) times daily., Disp: , Rfl:    senna (SENOKOT) 8.6 MG tablet, Take 1 tablet by mouth as needed for constipation., Disp: , Rfl:  Allergies  Allergen Reactions   Trulicity [Dulaglutide] Other (See Comments)    Patient indicated this caused pancreatitis.       Social History   Socioeconomic History   Marital status: Married    Spouse name: [name]   Number of children: 4   Years of education: 10   Highest education level: 10th grade  Occupational History   Occupation: retired  Tobacco Use   Smoking status: Former    Current packs/day: 0.00    Average packs/day: 0.5 packs/day for  10.0 years (5.0 ttl pk-yrs)    Types: Cigarettes    Start date: 04/26/1983    Quit date: 04/25/1993    Years since quitting: 30.9    Passive exposure: Past   Smokeless tobacco: Never  Vaping Use   Vaping status: Never Used  Substance and Sexual Activity   Alcohol use: No   Drug use: No   Sexual activity: Yes  Other Topics Concern   Not on file  Social History Narrative   Patient lives with his wife in [place].   Patent has 4 boys, one lives in [place] .    Patient enjoys fishing, camping in the woods, working outside, anything outdoor related.  Social Drivers of Health   Financial Resource Strain: Medium Risk (10/31/2023)   Overall Financial Resource Strain (CARDIA)    Difficulty of Paying Living Expenses: Somewhat hard  Food Insecurity: No Food Insecurity (12/31/2023)   Hunger Vital Sign    Worried About Running Out of Food in the Last Year: Never true    Ran Out of Food in the Last Year: Never true  Recent Concern: Food Insecurity - Food Insecurity Present (10/31/2023)   Hunger Vital Sign    Worried About Running Out of Food in the Last Year: Sometimes true    Ran Out of Food in the Last Year: Sometimes true  Transportation Needs: No Transportation Needs (12/31/2023)   PRAPARE - Administrator, Civil Service (Medical): No    Lack of Transportation (Non-Medical): No  Physical Activity: Inactive (10/31/2023)   Exercise Vital Sign    Days of Exercise per Week: 0 days    Minutes of Exercise per Session: Not on file  Stress: No Stress Concern Present (10/31/2023)   [employer] of Occupational Health - Occupational Stress Questionnaire    Feeling of Stress: Only a little  Social Connections: Moderately Isolated (12/31/2023)   Social Connection and Isolation Panel    Frequency of Communication with Friends and Family: More than three times a week    Frequency of Social Gatherings with Friends and Family: Once a week    Attends Religious Services: Never    Doctor, General Practice or Organizations: No    Attends Banker Meetings: Never    Marital Status: Married  Catering Manager Violence: Not At Risk (12/31/2023)   Humiliation, Afraid, Rape, and Kick questionnaire    Fear of Current or Ex-Partner: No    Emotionally Abused: No    Physically Abused: No    Sexually Abused: No    Physical Exam      Future Appointments  Date Time Provider Department Center  04/10/2024  3:00 PM Inocencio Soyla Lunger, MD [facility]  04/11/2024  2:00 PM Amalia Maude MATSU, RPH-CPP [facility]  04/12/2024  1:30 PM [facility] PA/NP [facility] None  08/26/2024  2:20 PM [facility] ANNUAL WELLNESS VISIT [facility]       Izetta Quivers, Paramedic [phone] [facility] Paramedic  04/02/24

## 2024-04-03 ENCOUNTER — Encounter (HOSPITAL_COMMUNITY): Payer: Self-pay

## 2024-04-03 NOTE — Progress Notes (Unsigned)
 Lab work received from [facility] Results from urinalysis  Reviewed by Conocophillips NP

## 2024-04-04 ENCOUNTER — Encounter (HOSPITAL_COMMUNITY): Payer: Self-pay | Admitting: *Deleted

## 2024-04-04 NOTE — Progress Notes (Signed)
 Nephrology increased pt's Carvedilol  to 25 mg Twice daily, med list updated

## 2024-04-10 ENCOUNTER — Encounter: Payer: Self-pay | Admitting: Cardiology

## 2024-04-10 ENCOUNTER — Encounter: Payer: Self-pay | Admitting: *Deleted

## 2024-04-10 ENCOUNTER — Ambulatory Visit: Admitting: Cardiology

## 2024-04-10 VITALS — BP 122/70 | HR 75 | Ht 64.0 in | Wt 196.0 lb

## 2024-04-10 DIAGNOSIS — I255 Ischemic cardiomyopathy: Secondary | ICD-10-CM | POA: Diagnosis not present

## 2024-04-10 DIAGNOSIS — I447 Left bundle-branch block, unspecified: Secondary | ICD-10-CM | POA: Diagnosis not present

## 2024-04-10 DIAGNOSIS — Z01812 Encounter for preprocedural laboratory examination: Secondary | ICD-10-CM

## 2024-04-10 DIAGNOSIS — I5022 Chronic systolic (congestive) heart failure: Secondary | ICD-10-CM

## 2024-04-10 LAB — BASIC METABOLIC PANEL WITH GFR
BUN/Creatinine Ratio: 24 (ref 10–24)
BUN: 54 mg/dL — ABNORMAL HIGH (ref 8–27)
CO2: 28 mmol/L (ref 20–29)
Calcium: 9.5 mg/dL (ref 8.6–10.2)
Chloride: 99 mmol/L (ref 96–106)
Creatinine, Ser: 2.29 mg/dL — ABNORMAL HIGH (ref 0.76–1.27)
Glucose: 168 mg/dL — ABNORMAL HIGH (ref 70–99)
Potassium: 3.9 mmol/L (ref 3.5–5.2)
Sodium: 141 mmol/L (ref 134–144)
eGFR: 29 mL/min/1.73 — ABNORMAL LOW (ref >59)

## 2024-04-10 LAB — CBC
Hematocrit: 38.4 % (ref 37.5–51.0)
Hemoglobin: 12.5 g/dL — ABNORMAL LOW (ref 13.0–17.7)
MCH: 29.6 pg (ref 26.6–33.0)
MCHC: 32.6 g/dL (ref 31.5–35.7)
MCV: 91 fL (ref 79–97)
Platelets: 301 x10E3/uL (ref 150–450)
RBC: 4.23 x10E6/uL (ref 4.14–5.80)
RDW: 11.9 % (ref 11.6–15.4)
WBC: 9.1 x10E3/uL (ref 3.4–10.8)

## 2024-04-10 NOTE — Progress Notes (Signed)
 Electrophysiology Office Note:   Date:  04/10/2024  ID:  [name], DOB 1950/11/21, MRN [id number]  Primary Cardiologist: Vinie JAYSON Maxcy, MD Primary Heart Failure: None Electrophysiologist: None      History of Present Illness:   [name] is a 73 y.o. male with h/o coronary artery disease post PCI to the LAD in 2005, diabetes, hypertension, CKD stage III, hyperlipidemia, chronic systolic heart failure seen today for  for Electrophysiology evaluation of heart failure at the request of Morna Dutch.    He is admitted to the hospital 09/21/2023 with epigastric pain radiating to his chest and back.  Troponin was elevated.  Cardiac cath showed severe three-vessel disease.  He underwent CABG x 4 on 09/27/2023.  He had difficulty during hospitalization with cardiogenic shock.  He was started on CRRT.  He had renal recovery and was able to be weaned off.  Repeat echo shows an ejection fraction of 30%.  Discussed the use of AI scribe software for clinical note transcription with the patient, who gave verbal consent to proceed.  History of Present Illness [name] is a 73 year old male with a history of heart attack and reduced heart function who presents for consultation regarding a pacemaker and defibrillator. He is accompanied by his wife.  He has a history of a heart attack in June, which led to a bypass surgery. Since then, he feels 'pretty good' but wishes he could 'get around a little better.'  He does have increased fatigue and mild shortness of breath, but is without limitations when walking on flat ground, although he is cautious due to a previous back injury. He does not use stairs frequently and does not experience orthopnea or paroxysmal nocturnal dyspnea.  Prior to his heart attack, he experienced bouts of shortness of breath and nausea. During the heart attack, he felt discomfort that 'wasn't hurting here' but 'was going across like this,' indicating a sensation across his chest.  Post-surgery, he has been managing well but is concerned about his mobility due to a back injury sustained after his heart surgery.  A recent ultrasound showed an ejection fraction of about 30%. He has a left bundle branch block, which affects the efficiency of his heart's electrical system.  He does not engage in activities that would interfere with a pacemaker or defibrillator, such as welding or working on the [employer], as he is retired. He spends his evenings watching TV and does not report any issues with his current lifestyle.    Review of systems complete and found to be negative unless listed in HPI.   EP Information / Studies Reviewed:    EKG is ordered today. Personal review as below.  EKG Interpretation Date/Time:  Wednesday April 10 2024 15:04:47 EST Ventricular Rate:  75 PR Interval:  176 QRS Duration:  138 QT Interval:  422 QTC Calculation: 471 R Axis:   59  Text Interpretation: Sinus rhythm with occasional Premature ventricular complexes Left bundle branch block Cannot rule out Anteroseptal infarct (cited on or before 10-Apr-2024) T wave abnormality, consider inferolateral ischemia When compared with ECG of 03-Jan-2024 10:22, No significant change since last tracing Confirmed by Gildo Crisco ([id number]) on 04/10/2024 3:14:51 PM    Risk Assessment/Calculations:           Physical Exam:   VS:  BP 122/70 (BP Location: Left Arm, Patient Position: Sitting, Cuff Size: Large)   Pulse 75   Ht 5' 4 (1.626 m)   Wt 196 lb (88.9 kg)  SpO2 97%   BMI 33.64 kg/m    Wt Readings from Last 3 Encounters:  04/10/24 196 lb (88.9 kg)  03/14/24 192 lb 12.8 oz (87.5 kg)  03/12/24 189 lb (85.7 kg)     GEN: Well nourished, well developed in no acute distress NECK: No JVD; No carotid bruits CARDIAC: Regular rate and rhythm, no murmurs, rubs, gallops RESPIRATORY:  Clear to auscultation without rales, wheezing or rhonchi  ABDOMEN: Soft, non-tender, non-distended EXTREMITIES:  No  edema; No deformity   ASSESSMENT AND PLAN:    1.  Chronic systolic heart failure: Due to ischemic cardiomyopathy.  Ejection fraction 30%.  He has a left bundle branch block.  He would benefit from resynchronization.  Due to that, we Alayzia Pavlock plan CRT-D implant.  Risks and benefits have been discussed.  The patient understands the risks and has agreed to the procedure.  Explained risks, benefits, and alternatives to ICD implantation, including but not limited to bleeding, infection, pneumothorax, pericardial effusion, lead dislodgement, heart attack, stroke, or death.  Pt verbalized understanding and agrees to proceed.  2.  Coronary artery disease: Post CABG.  No current chest pain.  Plan per primary cardiology.  3.  CKD stage IIIb: Followed by heart failure cardiology  Follow up with EP Team as usual post procedure  Signed, Corgan Mormile Gladis Norton, MD

## 2024-04-10 NOTE — Patient Instructions (Addendum)
 Medication Instructions:  Your physician recommends that you continue on your current medications as directed. Please refer to the Current Medication list given to you today.  *If you need a refill on your cardiac medications before your next appointment, please call your pharmacy*  Lab Work: Pre procedure labs today:  BMET & CBC  If you have any lab test that is abnormal or we need to change your treatment, we will call you to review the results.  Testing/Procedures: Your physician has recommended that you have a defibrillator inserted. An implantable cardioverter defibrillator (ICD) is a small device that is placed in your chest or, in rare cases, your abdomen. This device uses electrical pulses or shocks to help control life-threatening, irregular heartbeats that could lead the heart to suddenly stop beating (sudden cardiac arrest). Leads are attached to the ICD that goes into your heart. This is done in the hospital and usually requires an overnight stay. Please see the instruction sheet given to you today for more information.  Follow-Up: At [facility], you and your health needs are our priority.  As part of our continuing mission to provide you with exceptional heart care, our providers are all part of one team.  This team includes your primary Cardiologist (physician) and Advanced Practice Providers or APPs (Physician Assistants and Nurse Practitioners) who all work together to provide you with the care you need, when you need it.  Your next appointment:   2 week(s) after your defibrillator implant  Provider:   Device clinic for a wound check     Thank you for choosing [facility]!!   Maeola Domino, RN [phone]   Other Instructions  Cardioverter Defibrillator Implantation An implantable cardioverter defibrillator (ICD) is a device that detects abnormal heart rhythms, also called arrhythmias. When the ICD senses a heart rhythm that's not normal, it sends an  electrical signal to get the heartbeat back into a normal rhythm. In the implantation surgery, the ICD is placed under the skin in your chest or abdomen. An ICD has a battery and a small computer called a pulse generator. It also has wires, called leads, that go into the heart. The ICD detects and corrects two types of dangerous heart rhythms: Ventricular tachycardia. This is a very fast heart rhythm in the lower chambers of the heart. These chambers are called the ventricles. Ventricular fibrillation. This is when the ventricles contract in an uncoordinated way. Your health care provider may suggest an ICD if: You had an abnormal heart rhythm that started in the ventricles. Your heart has damage from a disease or heart condition. Your heart muscle is weak. You had a cardiac arrest before. You have: A congenital heart defect. This is a heart problem you were born with. You have other health problems that can affect your heart's electrical system. Tell a health care provider about: Any allergies you have. All medicines you're taking. These include vitamins, herbs, eye drops, creams, and over-the-counter medicines. Any problems you or family members have had with anesthesia. Any bleeding problems you have. Any surgeries you have had. Any medical conditions you have. Whether you're pregnant or may be pregnant. What are the risks? Your provider will talk with you about risks. These may include: Infection. Bleeding. Allergic reactions to medicines. Blood clots. Swelling or bruising. Damage to nearby structures or organs. These might be nerves, lungs, blood vessels, or the heart where parts of the ICD are placed. What happens before the procedure? When to stop eating and  drinking Follow instructions from your provider about what you may eat and drink. These may include: 8 hours before your procedure Stop eating most foods. Do not eat meat, fried foods, or fatty foods. Eat only light  foods, such as toast or crackers. All liquids are okay except energy drinks and alcohol. 6 hours before your procedure Stop eating. Drink only clear liquids, such as water, clear fruit juice, black coffee, plain tea, and sports drinks. Do not drink energy drinks or alcohol. 2 hours before your procedure Stop drinking all liquids. You may be allowed to take medicines with small sips of water. If you don't follow your provider's instructions, your procedure may be delayed or canceled. Medicines Ask your provider about: Changing or stopping your regular medicines. These include any diabetes medicines or blood thinners you take. Taking medicines such as aspirin  and ibuprofen. These medicines can thin your blood. Do not take them unless your provider tells you to. Taking over-the-counter medicines, vitamins, herbs, and supplements. Tests You may have an exam or testing. These may include: Blood tests. Electrocardiogram (ECG). This test records the electrical signals in your heart. Imaging tests, such as a chest X-ray. Echocardiogram. This uses sound waves to make pictures of your heart. An event monitor or Holter monitor to wear at home. These track your heart rhythm. General instructions Do not use any products that contain nicotine or tobacco for at least 4 weeks before the procedure. These products include cigarettes, chewing tobacco, and vaping devices, such as e-cigarettes. If you need help quitting, ask your provider. Ask your provider: How your procedure site will be marked. What steps will be taken to help prevent infection. These may include: Removing hair at the surgery site. Washing skin with a soap that kills germs. Taking antibiotics. If you'll be going home right after the procedure, plan to have a responsible adult: Take you home from the hospital or clinic. You won't be allowed to drive. Care for you for the time you are told. What happens during the procedure?  Monitors  will be put on your body. They will be used to check your heart rate, blood pressure, and oxygen level. A pair of sticky pads, called defibrillator pads, may be placed on your back and chest. These pads can pace your heart as needed during the procedure. An IV will be inserted into one of your veins. You may be given: A sedative. This helps you relax. Anesthesia. This keeps you from feeling pain. It will make you fall asleep for surgery. A small incision will be made to create a deep pocket under the skin of your chest or abdomen. Leads will be guided through a blood vessel into your heart and attached to your heart muscles. An X-ray machine called a fluoroscope will be used to help guide the leads. Depending on the ICD, the leads may go into one ventricle, or they may go into both ventricles and into an upper chamber of the heart. The other end of the leads will be attached to the ICD's pulse generator. The pulse generator will be placed into the pocket under your skin. The ICD will be tested, and your provider will program the ICD for the condition being treated. The incision will be closed with stitches, skin glue, tape strips, or staples. A bandage will be placed over the incision. The procedure may vary among providers and hospitals. What happens after the procedure? Your blood pressure, heart rate, breathing rate, and blood oxygen level will be monitored  until you leave the hospital or clinic. A chest X-ray will be done to check the ICD. Do not raise your arm higher than your shoulder for as long as told. This is usually at least 6 weeks. You may be given an ID card that shows you have an ICD. You'll be given a remote home monitoring device to use with your ICD. It allows your device to communicate with your provider. Do not drive until your provider says it's safe. This information is not intended to replace advice given to you by your health care provider. Make sure you discuss any  questions you have with your health care provider. Document Revised: 06/30/2022 Document Reviewed: 06/30/2022 Elsevier Patient Education  2024 [employer].

## 2024-04-10 NOTE — H&P (View-Only) (Signed)
 Electrophysiology Office Note:   Date:  04/10/2024  ID:  [name], DOB 1950/11/21, MRN [id number]  Primary Cardiologist: Vinie JAYSON Maxcy, MD Primary Heart Failure: None Electrophysiologist: None      History of Present Illness:   [name] is a 73 y.o. male with h/o coronary artery disease post PCI to the LAD in 2005, diabetes, hypertension, CKD stage III, hyperlipidemia, chronic systolic heart failure seen today for  for Electrophysiology evaluation of heart failure at the request of [name].    He is admitted to the hospital 09/21/2023 with epigastric pain radiating to his chest and back.  Troponin was elevated.  Cardiac cath showed severe three-vessel disease.  He underwent CABG x 4 on 09/27/2023.  He had difficulty during hospitalization with cardiogenic shock.  He was started on CRRT.  He had renal recovery and was able to be weaned off.  Repeat echo shows an ejection fraction of 30%.  Discussed the use of AI scribe software for clinical note transcription with the patient, who gave verbal consent to proceed.  History of Present Illness [name] is a 73 year old male with a history of heart attack and reduced heart function who presents for consultation regarding a pacemaker and defibrillator. He is accompanied by his wife.  He has a history of a heart attack in June, which led to a bypass surgery. Since then, he feels 'pretty good' but wishes he could 'get around a little better.'  He does have increased fatigue and mild shortness of breath, but is without limitations when walking on flat ground, although he is cautious due to a previous back injury. He does not use stairs frequently and does not experience orthopnea or paroxysmal nocturnal dyspnea.  Prior to his heart attack, he experienced bouts of shortness of breath and nausea. During the heart attack, he felt discomfort that 'wasn't hurting here' but 'was going across like this,' indicating a sensation across his chest.  Post-surgery, he has been managing well but is concerned about his mobility due to a back injury sustained after his heart surgery.  A recent ultrasound showed an ejection fraction of about 30%. He has a left bundle branch block, which affects the efficiency of his heart's electrical system.  He does not engage in activities that would interfere with a pacemaker or defibrillator, such as welding or working on the [employer], as he is retired. He spends his evenings watching TV and does not report any issues with his current lifestyle.    Review of systems complete and found to be negative unless listed in HPI.   EP Information / Studies Reviewed:    EKG is ordered today. Personal review as below.  EKG Interpretation Date/Time:  Wednesday April 10 2024 15:04:47 EST Ventricular Rate:  75 PR Interval:  176 QRS Duration:  138 QT Interval:  422 QTC Calculation: 471 R Axis:   59  Text Interpretation: Sinus rhythm with occasional Premature ventricular complexes Left bundle branch block Cannot rule out Anteroseptal infarct (cited on or before 10-Apr-2024) T wave abnormality, consider inferolateral ischemia When compared with ECG of 03-Jan-2024 10:22, No significant change since last tracing Confirmed by Gildo Crisco ([id number]) on 04/10/2024 3:14:51 PM    Risk Assessment/Calculations:           Physical Exam:   VS:  BP 122/70 (BP Location: Left Arm, Patient Position: Sitting, Cuff Size: Large)   Pulse 75   Ht 5' 4 (1.626 m)   Wt 196 lb (88.9 kg)  SpO2 97%   BMI 33.64 kg/m    Wt Readings from Last 3 Encounters:  04/10/24 196 lb (88.9 kg)  03/14/24 192 lb 12.8 oz (87.5 kg)  03/12/24 189 lb (85.7 kg)     GEN: Well nourished, well developed in no acute distress NECK: No JVD; No carotid bruits CARDIAC: Regular rate and rhythm, no murmurs, rubs, gallops RESPIRATORY:  Clear to auscultation without rales, wheezing or rhonchi  ABDOMEN: Soft, non-tender, non-distended EXTREMITIES:  No  edema; No deformity   ASSESSMENT AND PLAN:    1.  Chronic systolic heart failure: Due to ischemic cardiomyopathy.  Ejection fraction 30%.  He has a left bundle branch block.  He would benefit from resynchronization.  Due to that, we Alayzia Pavlock plan CRT-D implant.  Risks and benefits have been discussed.  The patient understands the risks and has agreed to the procedure.  Explained risks, benefits, and alternatives to ICD implantation, including but not limited to bleeding, infection, pneumothorax, pericardial effusion, lead dislodgement, heart attack, stroke, or death.  Pt verbalized understanding and agrees to proceed.  2.  Coronary artery disease: Post CABG.  No current chest pain.  Plan per primary cardiology.  3.  CKD stage IIIb: Followed by heart failure cardiology  Follow up with EP Team as usual post procedure  Signed, Corgan Mormile Gladis Norton, MD

## 2024-04-11 ENCOUNTER — Ambulatory Visit: Admitting: Pharmacist

## 2024-04-11 ENCOUNTER — Encounter: Payer: Self-pay | Admitting: Pharmacist

## 2024-04-11 ENCOUNTER — Telehealth (HOSPITAL_COMMUNITY): Payer: Self-pay

## 2024-04-11 VITALS — BP 112/56 | HR 67 | Wt 196.0 lb

## 2024-04-11 DIAGNOSIS — Z794 Long term (current) use of insulin: Secondary | ICD-10-CM | POA: Diagnosis not present

## 2024-04-11 DIAGNOSIS — E119 Type 2 diabetes mellitus without complications: Secondary | ICD-10-CM | POA: Diagnosis not present

## 2024-04-11 DIAGNOSIS — E1165 Type 2 diabetes mellitus with hyperglycemia: Secondary | ICD-10-CM | POA: Diagnosis not present

## 2024-04-11 MED ORDER — BASAGLAR KWIKPEN 100 UNIT/ML ~~LOC~~ SOPN: 36.0000 [IU] | PEN_INJECTOR | Freq: Every day | SUBCUTANEOUS | Status: AC

## 2024-04-11 NOTE — Patient Instructions (Signed)
 It was nice to see you today!  Your blood sugar is doing pretty well but we have a little room to improve.   Your goal blood sugar is 80-130 before eating and less than 180 after eating.  Medication Changes: Increase Basaglar  (insulin  glargine) from 30 to 36 units once daily.  Continue all other medication the same.   Keep up the good work with diet and exercise. Aim for a diet full of vegetables, fruit and lean meats (chicken, turkey, fish). Try to limit salt intake by eating fresh or frozen vegetables (instead of canned), rinse canned vegetables prior to cooking and do not add any additional salt to meals.

## 2024-04-11 NOTE — Assessment & Plan Note (Signed)
 Diabetes longstanding remains uncontrolled despite recent increase in basal insulin . Possibly related to pain/stress.  Most recent GMI is 8.9 which is modestly improved from last 9.5% (increased from A1C in August at 7.8) Medication adherence appears good. Control is suboptimal due to inadequate/suboptimal regimen. -Increased dose of basal insulin  Basaglar  (insulin  glargine) (insulin  glargine) from 30 to 36 units daily. Counseled patient to call the office if blood glucose dropped below 80.  -Continued SGLT2-I Jardiance  (empagliflozin ) 10 mg.  -Extensively discussed pathophysiology of diabetes, recommended lifestyle interventions, dietary effects on blood sugar control.

## 2024-04-11 NOTE — Telephone Encounter (Signed)
 Called to confirm/remind patient of their appointment at [facility] on 04/12/24 1:30.   Appointment:   [x] Confirmed  [] Left mess   [] No answer/No voice mail  [] VM Full/unable to leave message  [] Phone not in service  Patient reminded to bring all medications and/or complete list.  Confirmed patient has transportation. Gave directions, instructed to utilize valet parking.

## 2024-04-11 NOTE — Progress Notes (Signed)
 S:     Chief Complaint  Patient presents with   Medication Management    Diabetes   73 y.o. male who presents presents for diabetes evaluation, education, and management. Patient arrives in good spirits and presents without any assistance. Patient is accompanied by his wife, [name].     Patient was referred and last seen by Primary Care Provider, Dr. Cleotilde, on 01/16/24.    PMH is significant for T2DM with Chronic Kidney Disease, hyperlipidemia, rib fracture, hypertension, CAD.    Current diabetes medications include: Basaglar  (insulin  glargine) 30 units daily, Jardiance  (empagliflozin ) 10 mg daily  Current hypertension medications include: carvedilol  12.5 mg BID, hydralazine  75 mg TID, isosorbide  30 mg daily, torsemide  20 mg daily Current hyperlipidemia medications include: atorvastatin  80 mg daily Patient reports adherence to taking all medications as prescribed.    Insurance coverage: Medicare   Patient denies hypoglycemic event since last visit.    Patient-reported exercise habits: Not a lots. Broke his back and had open heart surgery in the recent past so activities have been restricted.  Back pain continues and he requests visit with PCP for further work-up.    O:   Review of Systems  Cardiovascular:  Negative for leg swelling.  Musculoskeletal:  Positive for back pain (8/10 reported today).  All other systems reviewed and are negative.   Physical Exam Vitals reviewed.  Constitutional:      Appearance: Normal appearance.  Pulmonary:     Effort: Pulmonary effort is normal.  Musculoskeletal:     Right lower leg: No edema.     Left lower leg: No edema.  Neurological:     Mental Status: He is alert.  Psychiatric:        Mood and Affect: Mood normal.        Thought Content: Thought content normal.        Judgment: Judgment normal.     Libre3 CGM Download today  % Time CGM is active: 75% Average Glucose: 233  mg/dL Glucose Management Indicator: 8.9  Glucose  Variability: 21% (goal <36%) Time in Goal:  - Time in range 70-180: 16% - Time above range: 84% - Time below range: 0% Observed patterns: Post-prandial elevation but variability removes low.  Lab Results  Component Value Date   HGBA1C 7.8 (A) 12/19/2023   Vitals:   04/11/24 1420  BP: (!) 112/56  Pulse: 67  SpO2: 98%    Lipid Panel     Component Value Date/Time   CHOL 129 12/26/2023 1554   CHOL 171 12/19/2023 1500   TRIG 155 (H) 12/26/2023 1554   HDL 47 12/26/2023 1554   HDL 54 12/19/2023 1500   CHOLHDL 2.7 12/26/2023 1554   VLDL 31 12/26/2023 1554   LDLCALC 51 12/26/2023 1554   LDLCALC 89 12/19/2023 1500   LDLDIRECT 56 09/10/2020 0947   Clinical Atherosclerotic Cardiovascular Disease (ASCVD): Yes   A/P: Diabetes longstanding remains uncontrolled despite recent increase in basal insulin . Possibly related to pain/stress.  Most recent GMI is 8.9 which is modestly improved from last 9.5% (increased from A1C in August at 7.8) Medication adherence appears good. Control is suboptimal due to inadequate/suboptimal regimen. -Increased dose of basal insulin  Basaglar  (insulin  glargine) (insulin  glargine) from 30 to 36 units daily. Counseled patient to call the office if blood glucose dropped below 80.  -Continued SGLT2-I Jardiance  (empagliflozin ) 10 mg.  -Extensively discussed pathophysiology of diabetes, recommended lifestyle interventions, dietary effects on blood sugar control.    ASCVD risk - secondary  prevention in patient with diabetes. Last LDL is 51 at goal of <70 mg/dL. High intensity statin indicated.  -Continued atorvastatin  80 mg   Hypertension longstanding currently controlled. Blood pressure goal of <130/80 mmHg. Medication adherence good.  -Continued carvedilol  12.5 mg BID, hydralazine  75 mg TID, isosorbide  30 mg daily, torsemide  20 mg daily    Written patient instructions provided. Patient verbalized understanding of treatment plan.  Total time in face to face  counseling 23 minutes.    Follow-up:  Pharmacist PRN PCP clinic visit in 05/07/2024 for Diabetes follow-up and Back Pain re-evaluation.

## 2024-04-12 ENCOUNTER — Encounter (HOSPITAL_COMMUNITY): Payer: Self-pay | Admitting: Emergency Medicine

## 2024-04-12 ENCOUNTER — Ambulatory Visit (INDEPENDENT_AMBULATORY_CARE_PROVIDER_SITE_OTHER): Admitting: Family Medicine

## 2024-04-12 ENCOUNTER — Encounter: Payer: Self-pay | Admitting: Family Medicine

## 2024-04-12 ENCOUNTER — Telehealth: Payer: Self-pay

## 2024-04-12 ENCOUNTER — Ambulatory Visit (HOSPITAL_COMMUNITY): Admission: RE | Admit: 2024-04-12 | Discharge: 2024-04-12 | Attending: Cardiology

## 2024-04-12 ENCOUNTER — Emergency Department (HOSPITAL_COMMUNITY)

## 2024-04-12 ENCOUNTER — Other Ambulatory Visit (HOSPITAL_COMMUNITY): Payer: Self-pay | Admitting: Cardiology

## 2024-04-12 ENCOUNTER — Encounter (HOSPITAL_COMMUNITY): Payer: Self-pay

## 2024-04-12 ENCOUNTER — Emergency Department (HOSPITAL_COMMUNITY)
Admission: EM | Admit: 2024-04-12 | Discharge: 2024-04-12 | Discharge status: Against Medical Advice | Attending: Cardiology | Admitting: Cardiology

## 2024-04-12 VITALS — BP 136/75 | HR 74 | Ht 64.0 in | Wt 199.4 lb

## 2024-04-12 VITALS — BP 116/73 | HR 75 | Ht 64.0 in | Wt 200.8 lb

## 2024-04-12 DIAGNOSIS — H9121 Sudden idiopathic hearing loss, right ear: Secondary | ICD-10-CM | POA: Diagnosis not present

## 2024-04-12 DIAGNOSIS — Z79899 Other long term (current) drug therapy: Secondary | ICD-10-CM | POA: Diagnosis not present

## 2024-04-12 DIAGNOSIS — I5022 Chronic systolic (congestive) heart failure: Secondary | ICD-10-CM | POA: Insufficient documentation

## 2024-04-12 DIAGNOSIS — I2510 Atherosclerotic heart disease of native coronary artery without angina pectoris: Secondary | ICD-10-CM | POA: Diagnosis not present

## 2024-04-12 DIAGNOSIS — Z951 Presence of aortocoronary bypass graft: Secondary | ICD-10-CM | POA: Insufficient documentation

## 2024-04-12 DIAGNOSIS — H9191 Unspecified hearing loss, right ear: Secondary | ICD-10-CM | POA: Insufficient documentation

## 2024-04-12 DIAGNOSIS — I252 Old myocardial infarction: Secondary | ICD-10-CM | POA: Diagnosis not present

## 2024-04-12 DIAGNOSIS — Z833 Family history of diabetes mellitus: Secondary | ICD-10-CM | POA: Diagnosis not present

## 2024-04-12 DIAGNOSIS — R42 Dizziness and giddiness: Secondary | ICD-10-CM | POA: Insufficient documentation

## 2024-04-12 DIAGNOSIS — Z7902 Long term (current) use of antithrombotics/antiplatelets: Secondary | ICD-10-CM | POA: Insufficient documentation

## 2024-04-12 DIAGNOSIS — Z7982 Long term (current) use of aspirin: Secondary | ICD-10-CM | POA: Diagnosis not present

## 2024-04-12 DIAGNOSIS — E785 Hyperlipidemia, unspecified: Secondary | ICD-10-CM | POA: Insufficient documentation

## 2024-04-12 DIAGNOSIS — Z794 Long term (current) use of insulin: Secondary | ICD-10-CM | POA: Insufficient documentation

## 2024-04-12 DIAGNOSIS — I130 Hypertensive heart and chronic kidney disease with heart failure and stage 1 through stage 4 chronic kidney disease, or unspecified chronic kidney disease: Secondary | ICD-10-CM | POA: Diagnosis present

## 2024-04-12 DIAGNOSIS — Z955 Presence of coronary angioplasty implant and graft: Secondary | ICD-10-CM | POA: Diagnosis not present

## 2024-04-12 DIAGNOSIS — Z5321 Procedure and treatment not carried out due to patient leaving prior to being seen by health care provider: Secondary | ICD-10-CM | POA: Insufficient documentation

## 2024-04-12 DIAGNOSIS — Z87891 Personal history of nicotine dependence: Secondary | ICD-10-CM | POA: Insufficient documentation

## 2024-04-12 DIAGNOSIS — Z555 Less than a high school diploma: Secondary | ICD-10-CM | POA: Insufficient documentation

## 2024-04-12 DIAGNOSIS — E1122 Type 2 diabetes mellitus with diabetic chronic kidney disease: Secondary | ICD-10-CM | POA: Diagnosis not present

## 2024-04-12 DIAGNOSIS — N1832 Chronic kidney disease, stage 3b: Secondary | ICD-10-CM | POA: Insufficient documentation

## 2024-04-12 DIAGNOSIS — Z8249 Family history of ischemic heart disease and other diseases of the circulatory system: Secondary | ICD-10-CM | POA: Insufficient documentation

## 2024-04-12 DIAGNOSIS — R29810 Facial weakness: Secondary | ICD-10-CM | POA: Diagnosis not present

## 2024-04-12 DIAGNOSIS — Z7984 Long term (current) use of oral hypoglycemic drugs: Secondary | ICD-10-CM | POA: Insufficient documentation

## 2024-04-12 LAB — CBC WITH DIFFERENTIAL/PLATELET
Abs Immature Granulocytes: 0.03 K/uL (ref 0.00–0.07)
Basophils Absolute: 0.1 K/uL (ref 0.0–0.1)
Basophils Relative: 1 %
Eosinophils Absolute: 0.2 K/uL (ref 0.0–0.5)
Eosinophils Relative: 2 %
HCT: 40.7 % (ref 39.0–52.0)
Hemoglobin: 12.7 g/dL — ABNORMAL LOW (ref 13.0–17.0)
Immature Granulocytes: 0 %
Lymphocytes Relative: 27 %
Lymphs Abs: 2.2 K/uL (ref 0.7–4.0)
MCH: 29.1 pg (ref 26.0–34.0)
MCHC: 31.2 g/dL (ref 30.0–36.0)
MCV: 93.1 fL (ref 80.0–100.0)
Monocytes Absolute: 0.5 K/uL (ref 0.1–1.0)
Monocytes Relative: 6 %
Neutro Abs: 5.3 K/uL (ref 1.7–7.7)
Neutrophils Relative %: 64 %
Platelets: 301 K/uL (ref 150–400)
RBC: 4.37 MIL/uL (ref 4.22–5.81)
RDW: 12.5 % (ref 11.5–15.5)
WBC: 8.2 K/uL (ref 4.0–10.5)
nRBC: 0 % (ref 0.0–0.2)

## 2024-04-12 LAB — COMPREHENSIVE METABOLIC PANEL WITH GFR
ALT: 17 U/L (ref 0–44)
AST: 27 U/L (ref 15–41)
Albumin: 4.4 g/dL (ref 3.5–5.0)
Alkaline Phosphatase: 74 U/L (ref 38–126)
Anion gap: 13 (ref 5–15)
BUN: 47 mg/dL — ABNORMAL HIGH (ref 8–23)
CO2: 28 mmol/L (ref 22–32)
Calcium: 10 mg/dL (ref 8.9–10.3)
Chloride: 101 mmol/L (ref 98–111)
Creatinine, Ser: 2.11 mg/dL — ABNORMAL HIGH (ref 0.61–1.24)
GFR, Estimated: 32 mL/min — ABNORMAL LOW
Glucose, Bld: 155 mg/dL — ABNORMAL HIGH (ref 70–99)
Potassium: 3.6 mmol/L (ref 3.5–5.1)
Sodium: 141 mmol/L (ref 135–145)
Total Bilirubin: 0.4 mg/dL (ref 0.0–1.2)
Total Protein: 8 g/dL (ref 6.5–8.1)

## 2024-04-12 LAB — MAGNESIUM: Magnesium: 2.6 mg/dL — ABNORMAL HIGH (ref 1.7–2.4)

## 2024-04-12 MED ORDER — BASAGLAR KWIKPEN 100 UNIT/ML ~~LOC~~ SOPN: 36.0000 [IU] | PEN_INJECTOR | Freq: Every day | SUBCUTANEOUS | 3 refills | Status: DC

## 2024-04-12 NOTE — Addendum Note (Signed)
 Addended by: Sharad Vaneaton G on: 04/12/2024 08:16 AM   Modules accepted: Orders

## 2024-04-12 NOTE — ED Triage Notes (Signed)
 Patient here for a MRI for a sudden loss of hearing this morning. Patients wife said that it started this morning and his PCP sent him thinking it could be a TIA. Patient is very HOH on arrival, his wife is giving all information.

## 2024-04-12 NOTE — Progress Notes (Signed)
 Reviewed and agree with Dr Rennis plan.

## 2024-04-12 NOTE — Patient Instructions (Signed)
" °  Follow-Up in: 3 MONTHS WITH DR. ROLAN PLEASE CALL OUR OFFICE AROUND MID JANUARY  TO GET SCHEDULED FOR YOUR APPOINTMENT. PHONE NUMBER IS [phone] OPTION 2   At the [facility], you and your health needs are our priority. We have a designated team specialized in the treatment of Heart Failure. This Care Team includes your primary Heart Failure Specialized Cardiologist (physician), Advanced Practice Providers (APPs- Physician Assistants and Nurse Practitioners), and Pharmacist who all work together to provide you with the care you need, when you need it.   You may see any of the following providers on your designated Care Team at your next follow up:  Dr. Toribio Fuel Dr. Ezra Rolan Dr. Odis Brownie Greig Mosses, NP Caffie Shed, [place] [facility] [place], [place] Beckey Coe, NP Jordan Lee, NP Tinnie Redman, PharmD   Please be sure to bring in all your medications bottles to every appointment.   Need to Contact Us :  If you have any questions or concerns before your next appointment please send us  a message through [place] or call our office at [phone].    TO LEAVE A MESSAGE FOR THE NURSE SELECT OPTION 2, PLEASE LEAVE A MESSAGE INCLUDING: YOUR NAME DATE OF BIRTH CALL BACK NUMBER REASON FOR CALL**this is important as we prioritize the call backs  YOU WILL RECEIVE A CALL BACK THE SAME DAY AS LONG AS YOU CALL BEFORE 4:00 PM  "

## 2024-04-12 NOTE — ED Notes (Signed)
 Mr. Forand stated he has been here too long and he is going home to call a clinical research associate.

## 2024-04-12 NOTE — ED Provider Triage Note (Signed)
 Emergency Medicine Provider Triage Evaluation Note  Dijuan Sleeth , a 73 y.o. male  was evaluated in triage.  Patient notes with hearing loss to his right ear and a right eye droop that began this morning upon waking.  Patient states that he had no symptoms last night when he went to bed.  Patient states that he was seen at his PCP for same and was sent here to the emergency department for CT imaging for possible TIA.  Patient has a history of Bell's palsy however states it was over 40 years ago.  Patient states that this morning upon waking he also had some mild lightheadedness.  Patient denies any other neurological changes such as numbness, tingling, difficulties with his speech, or visual changes.  Patient is in no acute distress.  Review of Systems  Positive: Hearing loss Negative:   Physical Exam  BP (!) 159/72   Pulse 72   Temp 98.2 F (36.8 C)   Resp 16   SpO2 100%  Gen:   Awake, no distress   Resp:  Normal effort  MSK:   Moves extremities without difficulty  Other:    Medical Decision Making  Medically screening exam initiated at 6:30 PM.  Appropriate orders placed.  Deionte Spivack was informed that the remainder of the evaluation will be completed by another provider, this initial triage assessment does not replace that evaluation, and the importance of remaining in the ED until their evaluation is complete.     Willma Duwaine [place], [place] 04/12/24 [phone]

## 2024-04-12 NOTE — Telephone Encounter (Signed)
 Patient's wife calls nurse line regarding patient being in the ED. She expresses frustration as she thought that provider was calling ahead to expedite patient being seen in the ED.   Advised wife that we call and give a report to the charge nurse to let them know patient is coming, however, this does not expedite patient being seen and that protocols are in place at hospital regarding the triage of patients.   Wife states that she was told that provider calling would eliminate long wait time.   Apologized for the confusion/miscommunication.   [name], RN

## 2024-04-12 NOTE — ED Triage Notes (Signed)
 Pt does look to  have a droop to his right eye and mouth , would be last seen normal last night , hx of bells palsy also

## 2024-04-12 NOTE — Progress Notes (Signed)
" ° ° °  SUBJECTIVE:   CHIEF COMPLAINT / HPI: hearing loss  Discussed the use of AI scribe software for clinical note transcription with the patient, who gave verbal consent to proceed.  History of Present Illness [name] is a 73 year old male who presents with sudden hearing loss and lightheadedness.  Sudden hearing loss - Sudden onset of muffled, clogged hearing in one ear upon awakening this morning - Hearing was normal the day prior - Attempted to wash out the ear without improvement - Sensation of head being muffled - No ear pain, congestion, or runny nose  Lightheadedness - Lightheadedness present upon waking, now resolved  Associated neurological and vestibular symptoms - No vertigo - No numbness, tingling, or weakness    PERTINENT  PMH / PSH: Hypertension, CAD, type 2 diabetes, history of Bell's palsy  OBJECTIVE:   BP 136/75   Pulse 74   Ht 5' 4 (1.626 m)   Wt 199 lb 6 oz (90.4 kg)   SpO2 97%   BMI 34.22 kg/m   Physical Exam General: NAD, well appearing Neuro: A&O Respiratory: normal WOB on RA Extremities: Moving all 4 extremities equally HEENT: Right Tympanic membranes normal, cerumen present, removed with currette NEUROLOGICAL: Cranial nerves II-XII grossly intact. Extraocular movements intact. Facial nerve function normal. Hypoglossal nerve function normal. Moves all extremities with normal strength and tone. Grip strength normal. Upper and lower extremity strength 5/5. Shoulder shrug strength normal. Romberg test negative. No pronator drift. Sensation symmetric and intact. Finger-to-nose test normal.   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  Clinically seems unlikely that this is cerebrovascular event.  However, cannot rule out.  Needs urgent evaluation.  Outside of acute stroke window, does  not require code stroke activation. - Directed to go to the ED for CT head and MRI for stroke rule out, offered EMS transport, patient declined EMS transport    Return in about 1 week (around 04/19/2024).  Nicholas Provencal, MD, PGY-3 [facility] 4:03 PM 04/12/2024  [facility]   "

## 2024-04-12 NOTE — Patient Instructions (Signed)
 It was great to see you! Thank you for allowing me to participate in your care!  Our plans for today:   VISIT SUMMARY: You came in today because of sudden hearing loss in one ear and lightheadedness. The doctor is concerned about a possible stroke or transient ischemic attack (TIA) and has referred you to the emergency room for further testing.  YOUR PLAN: SUDDEN SENSORINEURAL HEARING LOSS: You experienced sudden hearing loss in one ear this morning, which could be due to a serious condition like a stroke or TIA. -You need to go to the emergency room for a CT scan and MRI to rule out a cerebrovascular event.   Please arrive 15 minutes PRIOR to your next scheduled appointment time! If you do not, this affects OTHER patients' care.  Take care and seek immediate care sooner if you develop any concerns.   Ozell Provencal, MD, PGY-3 [facility] 3:56 PM 04/12/2024  [facility]

## 2024-04-12 NOTE — Progress Notes (Signed)
 "  Advanced Heart Failure Clinic Note   Referring Physician: PCP: Cleotilde Perkins, DO PCP-Cardiologist: Vinie JAYSON Maxcy, MD  AHF MD: Dr. Rolan   Chief Complaint: f/u for systolic heart failure  HPI:  73 y.o. 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    He was admitted 09/21/23 with epigastric pain radiating to chest and neck. HS troponin elevated to 673. BP markedly elevated. Echo with EF 35-40%, RV not well visualized. Cardiac cath with severe 3 vessel CAD. LVEDP 38-40 mmHg. He was diuresed with IV lasix  and started on GDMT.  He underwent CABG X 4 (LIMA to LAD, SVG to OM1, sequential to OM3, SVG to PDA) by Dr. Kerrin on 09/27/23. There was significant difficulty separating from cardiopulmonary bypass (took 3 attempts). Transferred to the ICU on inotrope/pressors. AHF team consulted to assist w/ post cardiotomy shock. Course was further complicated by AKI due to ATN. Scr rose to > 4 (prior baseline 1.3-1.6). He was started on CRRT for clearance and volume removal. Fortunately, he had renal recovery and able to wean off of CRRT. Also able to wean off pressors/inotropes. Transitioned to PO torsemide  for continued volume management and Imdur /hydralazine  for HF GDMT.  He was discharged home on 10/13/23. D/c wt 195 lb.   Repeat Echo 9/25 EF 30% with no LV thrombus, septal-lateral dyssynchrony due to LBBB, mild RV systolic dysfunction, IVC normal.   He has been referred to EP for consideration of CRT-D. Scheduled for implant next wk, 12/29.   He presents today for f/u for HF. Here w/ his wife. Doing well. Denies CP. No resting dyspnea. No dyspnea ambulating on flat surfaces. No LEE. Compliant w/ meds. BP well controlled.   Of note, he had labs done 2 days ago. Personally reviewed, SCr 2.29 c/w b/l. K 3.9     Labs (8/25): hgb 11.6, K 3.8, creatinine 2.57 Labs (09/25): Scr 2.31, K 4.0, Hgb 11.7 Labs (12/25):  Scr 2.29, K 3.9   Review of Systems: All systems reviewed and negative except as per HPI.   PMH: 1. CAD:  PCI LAD in 2005.  - CABG x 4 (6/25) with LIMA-LAD, SVG-OM1, seq SVG-OM3 and PDA.  2. Type 2 diabetes 3. HTN 4. CKD stage 3 5. Hyperlipidemia 6. Chronic systolic CHF: Ischemic cardiomyopathy.  Echo (6/25) with EF 35-40%, RV poorly visualized => repeat post-CABG in 6/25 with EF 25-30%.  - Echo (9/25): EF 30% with no LV thrombus, septal-lateral dyssynchrony due to LBBB, mild RV systolic dysfunction, IVC normal.  7. LBBB   Current Outpatient Medications  Medication Sig Dispense Refill   Accu-Chek Softclix Lancets lancets USE AS DIRECTED UP TO FOUR TIMES DAILY 100 each 12   aspirin  EC 81 MG tablet Take 81 mg by mouth daily.     atorvastatin  (LIPITOR ) 80 MG tablet Take 1 tablet (80 mg total) by mouth daily. 90 tablet 0   blood glucose meter kit and supplies Dispense based on patient and insurance preference. Use up to four times daily as directed. (FOR ICD-10 E10.9, E11.9). 1 each 0   Blood Glucose Monitoring Suppl (ACCU-CHEK GUIDE ME) w/Device KIT Please use to check blood sugar level up to four times daily. 1 kit 0   carvedilol  (COREG ) 25 MG tablet Take 25 mg by mouth 2 (two) times daily with a meal.     clopidogrel  (PLAVIX ) 75 MG tablet TAKE 1 TABLET(75 MG) BY  MOUTH DAILY 90 tablet 1   Continuous Glucose Sensor (FREESTYLE LIBRE 3 PLUS SENSOR) MISC Change sensor every 15 days. 1 each 3   cyclobenzaprine  (FLEXERIL ) 10 MG tablet Take 0.5 tablets (5 mg total) by mouth 2 (two) times daily as needed for muscle spasms. 10 tablet 0   DROPLET PEN NEEDLES 32G X 4 MM MISC USE 3 TIMES DAILY AS NEEDED BETWEEN MEALS AND BEDTIME 100 each 2   empagliflozin  (JARDIANCE ) 10 MG TABS tablet Take 1 tablet (10 mg total) by mouth daily before breakfast. 30 tablet 5   Fe Fum-Vit C-Vit B12-FA (TRIGELS-F FORTE PO) Take 1 tablet by mouth every Monday, Wednesday, and Friday.     feeding supplement (ENSURE PLUS  HIGH PROTEIN) LIQD Take 237 mLs by mouth 2 (two) times daily between meals.     glucose blood (ACCU-CHEK GUIDE TEST) test strip Please use to check blood sugar up to 4 times daily. 100 each 12   hydrALAZINE  (APRESOLINE ) 50 MG tablet Take 1.5 tablets (75 mg total) by mouth 3 (three) times daily. 135 tablet 3   Insulin  Glargine (BASAGLAR  KWIKPEN) 100 UNIT/ML Inject 36 Units into the skin daily. 45 mL 3   isosorbide  mononitrate (IMDUR ) 30 MG 24 hr tablet Take 1 tablet (30 mg total) by mouth daily. 90 tablet 1   lidocaine  (LIDODERM ) 5 % Place 1 patch onto the skin daily. Remove & Discard patch within 12 hours or as directed by MD 14 patch 0   ondansetron  (ZOFRAN -ODT) 4 MG disintegrating tablet Take 1 tablet (4 mg total) by mouth every 8 (eight) hours as needed for nausea or vomiting.     senna (SENOKOT) 8.6 MG tablet Take 1 tablet by mouth as needed for constipation.     torsemide  (DEMADEX ) 20 MG tablet Take 2 tablets (40 mg total) by mouth daily. 60 tablet 5   nitroGLYCERIN  (NITROSTAT ) 0.3 MG SL tablet Place 1 tablet (0.3 mg total) under the tongue every 5 (five) minutes as needed for chest pain. 90 tablet 0   No current facility-administered medications for this encounter.    Allergies  Allergen Reactions   Trulicity [Dulaglutide] Other (See Comments)    Patient indicated this caused pancreatitis.       Social History   Socioeconomic History   Marital status: Married    Spouse name: [name]   Number of children: 4   Years of education: 10   Highest education level: 10th grade  Occupational History   Occupation: retired  Tobacco Use   Smoking status: Former    Current packs/day: 0.00    Average packs/day: 0.5 packs/day for 10.0 years (5.0 ttl pk-yrs)    Types: Cigarettes    Start date: 04/26/1983    Quit date: 04/25/1993    Years since quitting: 30.9    Passive exposure: Past   Smokeless tobacco: Never  Vaping Use   Vaping status: Never Used  Substance and Sexual Activity    Alcohol use: No   Drug use: No   Sexual activity: Yes  Other Topics Concern   Not on file  Social History Narrative   Patient lives with his wife in [place].   Patent has 4 boys, one lives in [place] .    Patient enjoys fishing, camping in the woods, working outside, anything outdoor related.   Social Drivers of Health   Tobacco Use: Medium Risk (04/12/2024)   Patient History    Smoking Tobacco Use: Former    Smokeless Tobacco Use: Never  Passive Exposure: Past  Financial Resource Strain: Medium Risk (10/31/2023)   Overall Financial Resource Strain (CARDIA)    Difficulty of Paying Living Expenses: Somewhat hard  Food Insecurity: No Food Insecurity (12/31/2023)   Epic    Worried About Programme Researcher, Broadcasting/film/video in the Last Year: Never true    Ran Out of Food in the Last Year: Never true  Recent Concern: Food Insecurity - Food Insecurity Present (10/31/2023)   Epic    Worried About Programme Researcher, Broadcasting/film/video in the Last Year: Sometimes true    Ran Out of Food in the Last Year: Sometimes true  Transportation Needs: No Transportation Needs (12/31/2023)   Epic    Lack of Transportation (Medical): No    Lack of Transportation (Non-Medical): No  Physical Activity: Inactive (10/31/2023)   Exercise Vital Sign    Days of Exercise per Week: 0 days    Minutes of Exercise per Session: Not on file  Stress: No Stress Concern Present (10/31/2023)   [employer] of Occupational Health - Occupational Stress Questionnaire    Feeling of Stress: Only a little  Social Connections: Moderately Isolated (12/31/2023)   Social Connection and Isolation Panel    Frequency of Communication with Friends and Family: More than three times a week    Frequency of Social Gatherings with Friends and Family: Once a week    Attends Religious Services: Never    Database Administrator or Organizations: No    Attends Banker Meetings: Never    Marital Status: Married  Catering Manager Violence: Not At Risk (12/31/2023)    Epic    Fear of Current or Ex-Partner: No    Emotionally Abused: No    Physically Abused: No    Sexually Abused: No  Depression (PHQ2-9): Low Risk (01/16/2024)   Depression (PHQ2-9)    PHQ-2 Score: 1  Alcohol Screen: Low Risk (08/21/2023)   Alcohol Screen    Last Alcohol Screening Score (AUDIT): 0  Housing: Unknown (12/31/2023)   Epic    Unable to Pay for Housing in the Last Year: No    Number of Times Moved in the Last Year: 0    Homeless in the Last Year: Not on file  Utilities: Not At Risk (12/31/2023)   Epic    Threatened with loss of utilities: No  Health Literacy: Adequate Health Literacy (10/17/2023)   B1300 Health Literacy    Frequency of need for help with medical instructions: Rarely      Family History  Problem Relation Age of Onset   Heart disease Mother    Diabetes Mother    Hypertension Mother    Alcoholism Father    Heart disease Father    Hypertension Sister    Diabetes Brother    Hypercholesterolemia Brother    Osteoporosis Brother    Hypertension Brother    Kidney disease Brother     Vitals:   04/12/24 1344  BP: 116/73  Pulse: 75  SpO2: 96%  Weight: 91.1 kg (200 lb 12.8 oz)  Height: 5' 4 (1.626 m)    PHYSICAL EXAM: GENERAL: NAD Lungs- clear  CARDIAC:  JVP not elevated          Normal rate with regular rhythm. No MRG. No LEE  ABDOMEN: Soft, non-tender, non-distended.  EXTREMITIES: Warm and well perfused.  NEUROLOGIC: No obvious FND    ASSESSMENT & PLAN:  1. Chronic systolic CHF: Ischemic cardiomyopathy. Echo pre-CABG with EF 35-40%, RV hard to visualize. Difficulty separating from  cardiopulmonary bypass following CABG 09/27/23. Required inotropic support for post-cardiotomy shock. Repeat Echo 09/30/23 EF 25-30%, RV not well visualized. Recovered and weaned off pressors.  Repeat echo 9/25 showed EF 30% with no LV thrombus, septal-lateral dyssynchrony due to LBBB, mild RV systolic dysfunction, IVC normal.  Stable NYHA Class II. Euvolemic on exam   - Continue Torsemide  40 mg daily. Reviewed BMP done this wk. SCr/K stable  - GDMT limited by CKD - Continue Jardiance  10 mg daily - Continue hydralazine  75 mg TID + Imdur  30 mg daily - Continue Coreg  12.5 mg BID  - EF remains low 3 months post-CABG, he also has dyssynchrony on echo even though LBBB is not markedly wide (130 msec). Has been seen by EP and scheduled for CRT-D 12/29  2. CAD: History of PCI to LAD in 2005.  NSTEMI 09/21/23 with multivessel disease on LHC. S/p CABG X 4 (LIMA to LAD, SVG to OM1, sequential SVG to OM3, PDA) on 09/27/23.  Stable w/o CP   - Continue ASA + plavix  75 mg daily - Continue atorva 80 mg daily. LDL good at  51 in 9/25 3. CKD IIIb/IV: Developed AKI due ATN post-CABG. SCr rose > 4. He required short term CRRT for clearance and volume removal. Fortunately w/ renal recovery. Creatinine has been stable recently around 2.5.  - I've reviewed BMP from this wk, SCr stable at 2.29  - Continue Jardiance  4. DM II: Per PCP.   F/u w/ Dr. Rolan in 3 months    Nicholas Shed, PA-C 04/12/2024  "

## 2024-04-19 NOTE — Pre-Procedure Instructions (Signed)
 Instructed patient on the following items: Arrival time 1200 Nothing to eat or drink after midnight No meds AM of procedure Responsible person to drive you home and stay with you for 24 hrs Wash with special soap night before and morning of procedure If on anti-coagulant drug instructions Plavix - last dose 12/26

## 2024-04-22 ENCOUNTER — Ambulatory Visit (HOSPITAL_COMMUNITY)
Admission: RE | Disposition: A | Discharge status: Home / Self Care | Payer: Self-pay | Source: Home / Self Care | Attending: Cardiology

## 2024-04-22 ENCOUNTER — Ambulatory Visit (HOSPITAL_COMMUNITY)
Admission: RE | Admit: 2024-04-22 | Discharge: 2024-04-22 | Disposition: A | Discharge status: Home / Self Care | Attending: Cardiology | Admitting: Cardiology

## 2024-04-22 ENCOUNTER — Other Ambulatory Visit: Payer: Self-pay

## 2024-04-22 ENCOUNTER — Ambulatory Visit (HOSPITAL_COMMUNITY)

## 2024-04-22 DIAGNOSIS — E1122 Type 2 diabetes mellitus with diabetic chronic kidney disease: Secondary | ICD-10-CM | POA: Insufficient documentation

## 2024-04-22 DIAGNOSIS — N1832 Chronic kidney disease, stage 3b: Secondary | ICD-10-CM | POA: Diagnosis not present

## 2024-04-22 DIAGNOSIS — I5022 Chronic systolic (congestive) heart failure: Secondary | ICD-10-CM

## 2024-04-22 DIAGNOSIS — Z951 Presence of aortocoronary bypass graft: Secondary | ICD-10-CM | POA: Insufficient documentation

## 2024-04-22 DIAGNOSIS — I255 Ischemic cardiomyopathy: Secondary | ICD-10-CM

## 2024-04-22 DIAGNOSIS — I2510 Atherosclerotic heart disease of native coronary artery without angina pectoris: Secondary | ICD-10-CM | POA: Diagnosis not present

## 2024-04-22 DIAGNOSIS — I130 Hypertensive heart and chronic kidney disease with heart failure and stage 1 through stage 4 chronic kidney disease, or unspecified chronic kidney disease: Secondary | ICD-10-CM | POA: Diagnosis not present

## 2024-04-22 DIAGNOSIS — I252 Old myocardial infarction: Secondary | ICD-10-CM | POA: Diagnosis not present

## 2024-04-22 DIAGNOSIS — I447 Left bundle-branch block, unspecified: Secondary | ICD-10-CM | POA: Diagnosis not present

## 2024-04-22 HISTORY — PX: BIV ICD INSERTION CRT-D: EP1195

## 2024-04-22 HISTORY — PX: LEAD INSERTION: EP1212

## 2024-04-22 LAB — GLUCOSE, CAPILLARY
Glucose-Capillary: 211 mg/dL — ABNORMAL HIGH (ref 70–99)
Glucose-Capillary: 151 mg/dL — ABNORMAL HIGH (ref 70–99)

## 2024-04-22 MED ORDER — CHLORHEXIDINE GLUCONATE 4 % EX SOLN
4.0000 | Freq: Once | CUTANEOUS | Status: DC
  Filled 2024-04-22: qty 60

## 2024-04-22 MED ORDER — POVIDONE-IODINE 10 % EX SWAB
2.0000 | Freq: Once | CUTANEOUS | Status: AC
  Administered 2024-04-22: 2 via TOPICAL

## 2024-04-22 MED ORDER — SODIUM CHLORIDE 0.9 % IV SOLN: INTRAVENOUS | Status: DC

## 2024-04-22 MED ORDER — MIDAZOLAM HCL 2 MG/2ML IJ SOLN
INTRAMUSCULAR | Status: AC
  Filled 2024-04-22: qty 2

## 2024-04-22 MED ORDER — FENTANYL CITRATE (PF) 100 MCG/2ML IJ SOLN
INTRAMUSCULAR | Status: AC
  Filled 2024-04-22: qty 2

## 2024-04-22 MED ORDER — ACETAMINOPHEN 325 MG PO TABS: 325.0000 mg | ORAL_TABLET | ORAL | Status: DC | PRN

## 2024-04-22 MED ORDER — CEFAZOLIN SODIUM-DEXTROSE 2-4 GM/100ML-% IV SOLN
INTRAVENOUS | Status: AC
  Filled 2024-04-22: qty 100

## 2024-04-22 MED ORDER — FENTANYL CITRATE (PF) 100 MCG/2ML IJ SOLN
INTRAMUSCULAR | Status: DC | PRN
  Administered 2024-04-22 (×3): 25 ug via INTRAVENOUS

## 2024-04-22 MED ORDER — MIDAZOLAM HCL 5 MG/5ML IJ SOLN
INTRAMUSCULAR | Status: DC | PRN
  Administered 2024-04-22 (×3): 1 mg via INTRAVENOUS

## 2024-04-22 MED ORDER — CEFAZOLIN SODIUM-DEXTROSE 2-4 GM/100ML-% IV SOLN
2.0000 g | INTRAVENOUS | Status: AC
  Administered 2024-04-22: 2 g via INTRAVENOUS

## 2024-04-22 MED ORDER — SODIUM CHLORIDE 0.9 % IV SOLN: 80.0000 mg | INTRAVENOUS | Status: DC

## 2024-04-22 MED ORDER — SODIUM CHLORIDE 0.9 % IV SOLN
INTRAVENOUS | Status: AC
  Administered 2024-04-22: 80 mg
  Filled 2024-04-22: qty 2

## 2024-04-22 MED ORDER — ONDANSETRON HCL 4 MG/2ML IJ SOLN: 4.0000 mg | Freq: Four times a day (QID) | INTRAMUSCULAR | Status: DC | PRN

## 2024-04-22 MED ORDER — HEPARIN (PORCINE) IN NACL 1000-0.9 UT/500ML-% IV SOLN
INTRAVENOUS | Status: DC | PRN
  Administered 2024-04-22: 500 mL

## 2024-04-22 MED ORDER — LIDOCAINE HCL (PF) 1 % IJ SOLN
INTRAMUSCULAR | Status: AC
  Filled 2024-04-22: qty 60

## 2024-04-22 MED ORDER — LIDOCAINE HCL (PF) 1 % IJ SOLN
INTRAMUSCULAR | Status: DC | PRN
  Administered 2024-04-22: 45 mL

## 2024-04-22 NOTE — Progress Notes (Signed)
 Discharge instructions reviewed with patient and wife at bedside. Denies questions concerns. PT tolerated PO intake. Ambulated in the hallway, was able to void without difficulty. Seen by MD, Device Rep, EKG in chart, Chest xray performed and resulted. Incision site remains clean dry and intact. No s/s of complications. PT escorted from the unit via wheel chair to personal vehicle.

## 2024-04-22 NOTE — Discharge Instructions (Addendum)
 After Your ICD (Implantable Cardiac Defibrillator)   You have a Medtronic ICD  If you have a Medtronic or Biotronik device, plug in your home monitor once you get home, and no manual interaction is required.   If you have an [employer] or [employer] device, plug your home monitor once you get home, sit near the device, and press the large activation button. Sit nearby until the process is complete, usually notated by lights on the monitor.   If you were set up for monitoring using an app on your phone, make sure the app remains open in the background and the Bluetooth remains on.  ACTIVITY Do not lift your arm above shoulder height for 1 week after your procedure. After 7 days, you may progress as below.  You should remove your sling 24 hours after your procedure, unless otherwise instructed by your provider.     Monday April 29, 2024  Tuesday April 30, 2024 Wednesday May 01, 2024 Thursday May 02, 2024   Do not lift, push, pull, or carry anything over 10 pounds with the affected arm until 6 weeks (Monday June 03, 2024 ) after your procedure.   You may drive AFTER your wound check, UNLESS you have been told otherwise by your provider.   Ask your healthcare provider when you can go back to work   INCISION/Dressing If you are on a blood thinner such as Coumadin, Xarelto, Eliquis, Plavix , or Pradaxa please confirm with your provider when this should be resumed. Resume Clopidogrel  (Plavix ) 04/25/24  If large square, outer bandage is left in place, this can be removed after 24 hours from your procedure. Do not remove steri-strips or glue as below.   Monitor your defibrillator site for redness, swelling, and drainage. Call the device clinic at [phone] if you experience these symptoms or fever/chills.    If your incision is sealed with Steri-strips or staples, you may shower 7 days after your procedure or when told by your provider. Do not remove the steri-strips or let the  shower hit directly on your site. You may wash around your site with soap and water.    If you were discharged in a sling, please do not wear this during the day more than 48 hours after your surgery unless otherwise instructed. This may increase the risk of stiffness and soreness in your shoulder.   Avoid lotions, ointments, or perfumes over your incision until it is well-healed.  You may use a hot tub or a pool AFTER your wound check appointment if the incision is completely closed.  Your ICD is designed to protect you from life threatening heart rhythms. Because of this, you may receive a shock.   1 shock with no symptoms:  Call the office during business hours. 1 shock with symptoms (chest pain, chest pressure, dizziness, lightheadedness, shortness of breath, overall feeling unwell):  Call 911. If you experience 2 or more shocks in 24 hours:  Call 911. If you receive a shock, you should not drive for 6 months per the [place] DMV IF you receive appropriate therapy from your ICD.   ICD Alerts:  Some alerts are vibratory and others beep. These are NOT emergencies. Please call our office to let us  know. If this occurs at night or on weekends, it can wait until the next business day. Send a remote transmission.  If your device is capable of reading fluid status (for heart failure), you will be offered monthly monitoring to review this with you.  DEVICE MANAGEMENT Remote monitoring is used to monitor your ICD from home. This monitoring is scheduled every 91 days by our office. It allows us  to keep an eye on the functioning of your device to ensure it is working properly. You will routinely see your Electrophysiologist annually (more often if necessary). This will appear as a REMOTE check on your MyChart schedule. These are automatic and there is nothing for you to manually do unless otherwise instructed.  You should receive your ID card for your new device in 4-8 weeks. Keep this card with you at all  times once received. Consider wearing a medical alert bracelet or necklace.  Your ICD  may be MRI compatible. This will be discussed at your next office visit/wound check.  You should avoid contact with strong electric or magnetic fields.   Do not use amateur (ham) radio equipment or electric (arc) welding torches. MP3 player headphones with magnets should not be used. Some devices are safe to use if held at least 12 inches (30 cm) from your defibrillator. These include power tools, lawn mowers, and speakers. If you are unsure if something is safe to use, ask your health care provider.  When using your cell phone, hold it to the ear that is on the opposite side from the defibrillator. Do not leave your cell phone in a pocket over the defibrillator.  You may safely use electric blankets, heating pads, computers, and microwave ovens.  Call the office right away if: You have chest pain. You feel more than one shock. You feel more short of breath than you have felt before. You feel more light-headed than you have felt before. Your incision starts to open up.  This information is not intended to replace advice given to you by your health care provider. Make sure you discuss any questions you have with your health care provider.

## 2024-04-22 NOTE — Interval H&P Note (Signed)
 History and Physical Interval Note:  04/22/2024 12:16 PM  [name]  has presented today for surgery, with the diagnosis of cardiomyopathy.  The various methods of treatment have been discussed with the patient and family. After consideration of risks, benefits and other options for treatment, the patient has consented to  Procedures: BIV ICD INSERTION CRT-D (N/A) LEAD INSERTION (N/A) as a surgical intervention.  The patient's history has been reviewed, patient examined, no change in status, stable for surgery.  I have reviewed the patient's chart and labs.  Questions were answered to the patient's satisfaction.     Johaan Ryser [employer]

## 2024-04-23 ENCOUNTER — Encounter (HOSPITAL_COMMUNITY): Payer: Self-pay | Admitting: Cardiology

## 2024-04-23 ENCOUNTER — Telehealth: Payer: Self-pay

## 2024-04-23 MED FILL — Midazolam HCl Inj 2 MG/2ML (Base Equivalent): INTRAMUSCULAR | Qty: 3 | Status: AC

## 2024-04-23 NOTE — Telephone Encounter (Signed)
 Follow-up after same day discharge: Implant date: 04/22/2024 MD: Soyla Norton, MD Device: ICD  Location: L chest    Wound check visit: 05/02/2024 90 day MD follow-up: 07/22/2024  Remote Transmission received:yes  Dressing/sling removed: n/a  Confirm OAC restart on: yes  Please continue to monitor your cardiac device site for redness, swelling, and drainage. Call the device clinic at [phone] if you experience these symptoms, fever/chills, or have questions about your device.   Remote monitoring is used to monitor your cardiac device from home. This monitoring is scheduled every 91 days by our office. It allows us  to keep an eye on the functioning of your device to ensure it is working properly.

## 2024-05-01 ENCOUNTER — Other Ambulatory Visit (HOSPITAL_COMMUNITY): Payer: Self-pay

## 2024-05-01 NOTE — Progress Notes (Signed)
 Paramedicine Encounter    Patient ID: [name], male    DOB: 01-Apr-1951, 74 y.o.   MRN: [id number]   Complaints-tenderness to his ICD area-no bleeding-looks great   Edema-none   Compliance with meds-yes  Pill box filled-no  If so, by whom-wife   Refills needed-none  Pt reports he is doing good. He had ICD placed since I seen him last. He is healing good, no swelling, no bleeding, no issues or concerns with that right now. He goes tomor for a check.  He denies increased sob, no dizziness, no c/p.  He does report an off and on burning sensation over his CABG scar, it sounds as he describes it more of superficial or right under the skin irritation. But no redness or anything associated with it.  He will mention it to doc tomor when he goes.   His b/p have been ranging from 120s-150 systolic. He is due for his mid day dose of hydralazine .   He has PCP appoint next week, will f/u week after that.    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   Type 2 diabetes mellitus with hyperglycemia, with long-term current use of insulin  (HCC) 09/23/2023   Ischemic heart disease due to coronary artery obstruction 09/22/2023   Type 2 diabetes mellitus with diabetic chronic kidney disease (HCC) 09/21/2023   Coronary artery disease involving native coronary artery of native heart without angina pectoris     Hypercholesterolemia    Low-level of literacy 05/21/2018   Globus sensation 03/12/2018   Essential hypertension, benign 06/09/2017   Current Medications[1] Allergies[2]    Social History   Socioeconomic History   Marital status: Married    Spouse name: [name]   Number of children: 4   Years of education: 10   Highest education level: 10th grade  Occupational History   Occupation: retired  Tobacco Use   Smoking status: Former    Current packs/day: 0.00    Average packs/day: 0.5 packs/day for 10.0 years (5.0 ttl pk-yrs)    Types: Cigarettes    Start date: 04/26/1983    Quit date: 04/25/1993    Years since quitting: 31.0    Passive exposure: Past   Smokeless tobacco: Never  Vaping Use   Vaping status: Never Used  Substance and Sexual Activity   Alcohol use: No   Drug use: No   Sexual activity: Yes  Other Topics Concern   Not on file  Social History Narrative   Patient lives with his wife in [place].   Patent has 4 boys, one lives in [place] .    Patient enjoys fishing, camping in the woods, working outside, anything outdoor related.   Social Drivers of Health   Tobacco Use: Medium Risk (04/12/2024)   Patient History    Smoking Tobacco Use: Former    Smokeless Tobacco Use: Never  Passive Exposure: Past  Financial Resource Strain: Medium Risk (10/31/2023)   Overall Financial Resource Strain (CARDIA)    Difficulty of Paying Living Expenses: Somewhat hard  Food Insecurity: No Food Insecurity (12/31/2023)   Epic    Worried About Programme Researcher, Broadcasting/film/video in the Last Year: Never true    Ran Out of Food in the Last Year: Never true  Recent Concern: Food Insecurity - Food Insecurity Present (10/31/2023)   Epic    Worried About Programme Researcher, Broadcasting/film/video in the Last Year: Sometimes true    Ran Out of Food in the Last Year: Sometimes true  Transportation Needs: No Transportation Needs (12/31/2023)   Epic    Lack of Transportation (Medical): No    Lack of Transportation (Non-Medical): No   Physical Activity: Inactive (10/31/2023)   Exercise Vital Sign    Days of Exercise per Week: 0 days    Minutes of Exercise per Session: Not on file  Stress: No Stress Concern Present (10/31/2023)   [employer] of Occupational Health - Occupational Stress Questionnaire    Feeling of Stress: Only a little  Social Connections: Moderately Isolated (12/31/2023)   Social Connection and Isolation Panel    Frequency of Communication with Friends and Family: More than three times a week    Frequency of Social Gatherings with Friends and Family: Once a week    Attends Religious Services: Never    Database Administrator or Organizations: No    Attends Banker Meetings: Never    Marital Status: Married  Catering Manager Violence: Not At Risk (12/31/2023)   Epic    Fear of Current or Ex-Partner: No    Emotionally Abused: No    Physically Abused: No    Sexually Abused: No  Depression (PHQ2-9): Low Risk (04/12/2024)   Depression (PHQ2-9)    PHQ-2 Score: 0  Alcohol Screen: Low Risk (08/21/2023)   Alcohol Screen    Last Alcohol Screening Score (AUDIT): 0  Housing: Unknown (12/31/2023)   Epic    Unable to Pay for Housing in the Last Year: No    Number of Times Moved in the Last Year: 0    Homeless in the Last Year: Not on file  Utilities: Not At Risk (12/31/2023)   Epic    Threatened with loss of utilities: No  Health Literacy: Adequate Health Literacy (10/17/2023)   B1300 Health Literacy    Frequency of need for help with medical instructions: Rarely    Physical Exam      Future Appointments  Date Time Provider Department Center  05/02/2024 12:00 PM [facility] DEVICE 1 [facility]  05/07/2024 11:30 AM Cleotilde Perkins, DO [facility]  05/27/2024  7:05 AM [facility] HVT DEVICE REMOTES [facility]  07/22/2024  1:55 PM Aniceto Jarvis L, NP [facility]  08/26/2024  7:05 AM [facility] HVT DEVICE REMOTES [facility]  08/26/2024  2:20 PM [facility] ANNUAL WELLNESS VISIT [facility]  11/25/2024  7:05  AM [facility] DEVICE REMOTES [facility]  02/24/2025  7:05 AM [facility] DEVICE REMOTES [facility]  05/26/2025  7:05 AM [facility] HVT DEVICE REMOTES [facility]       Izetta Quivers, Paramedic [phone] [facility]  05/01/2024     [1]  Current Outpatient Medications:    Accu-Chek Softclix Lancets lancets, USE AS DIRECTED UP TO FOUR TIMES DAILY, Disp: 100 each, Rfl: 12   aspirin  EC 81 MG tablet, Take 81 mg by mouth daily., Disp: , Rfl:  atorvastatin  (LIPITOR ) 80 MG tablet, Take 1 tablet (80 mg total) by mouth daily., Disp: 90 tablet, Rfl: 0   blood glucose meter kit and supplies, Dispense based on patient and insurance preference. Use up to four times daily as directed. (FOR ICD-10 E10.9, E11.9)., Disp: 1 each, Rfl: 0   Blood Glucose Monitoring Suppl (ACCU-CHEK GUIDE ME) w/Device KIT, Please use to check blood sugar level up to four times daily., Disp: 1 kit, Rfl: 0   carvedilol  (COREG ) 25 MG tablet, Take 25 mg by mouth 2 (two) times daily with a meal., Disp: , Rfl:    clopidogrel  (PLAVIX ) 75 MG tablet, TAKE 1 TABLET(75 MG) BY MOUTH DAILY, Disp: 90 tablet, Rfl: 1   Continuous Glucose Sensor (FREESTYLE LIBRE 3 PLUS SENSOR) MISC, Change sensor every 15 days., Disp: 1 each, Rfl: 3   cyclobenzaprine  (FLEXERIL ) 10 MG tablet, Take 0.5 tablets (5 mg total) by mouth 2 (two) times daily as needed for muscle spasms., Disp: 10 tablet, Rfl: 0   DROPLET PEN NEEDLES 32G X 4 MM MISC, USE 3 TIMES DAILY AS NEEDED BETWEEN MEALS AND BEDTIME, Disp: 100 each, Rfl: 2   empagliflozin  (JARDIANCE ) 10 MG TABS tablet, Take 1 tablet (10 mg total) by mouth daily before breakfast., Disp: 30 tablet, Rfl: 5   Fe Fum-Vit C-Vit B12-FA (TRIGELS-F FORTE PO), Take 1 tablet by mouth every Monday, Wednesday, and Friday., Disp: , Rfl:    feeding supplement (ENSURE PLUS HIGH PROTEIN) LIQD, Take 237 mLs by mouth 2 (two) times daily between meals., Disp: , Rfl:    glucose blood (ACCU-CHEK GUIDE TEST) test strip, Please use  to check blood sugar up to 4 times daily., Disp: 100 each, Rfl: 12   hydrALAZINE  (APRESOLINE ) 50 MG tablet, Take 1.5 tablets (75 mg total) by mouth 3 (three) times daily., Disp: 135 tablet, Rfl: 3   Insulin  Glargine (BASAGLAR  KWIKPEN) 100 UNIT/ML, Inject 36 Units into the skin daily., Disp: 45 mL, Rfl: 3   isosorbide  mononitrate (IMDUR ) 30 MG 24 hr tablet, Take 1 tablet (30 mg total) by mouth daily., Disp: 90 tablet, Rfl: 1   lidocaine  (LIDODERM ) 5 %, Place 1 patch onto the skin daily. Remove & Discard patch within 12 hours or as directed by MD, Disp: 14 patch, Rfl: 0   nitroGLYCERIN  (NITROSTAT ) 0.3 MG SL tablet, Place 1 tablet (0.3 mg total) under the tongue every 5 (five) minutes as needed for chest pain., Disp: 90 tablet, Rfl: 0   ondansetron  (ZOFRAN -ODT) 4 MG disintegrating tablet, Take 1 tablet (4 mg total) by mouth every 8 (eight) hours as needed for nausea or vomiting., Disp: , Rfl:    senna (SENOKOT) 8.6 MG tablet, Take 1 tablet by mouth as needed for constipation., Disp: , Rfl:    torsemide  (DEMADEX ) 20 MG tablet, Take 2 tablets (40 mg total) by mouth daily., Disp: 60 tablet, Rfl: 5 [2]  Allergies Allergen Reactions   Trulicity [Dulaglutide] Other (See Comments)    Patient indicated this caused pancreatitis.

## 2024-05-02 ENCOUNTER — Ambulatory Visit: Attending: Student in an Organized Health Care Education/Training Program

## 2024-05-02 ENCOUNTER — Ambulatory Visit: Payer: Self-pay | Admitting: Cardiology

## 2024-05-02 DIAGNOSIS — I5022 Chronic systolic (congestive) heart failure: Secondary | ICD-10-CM

## 2024-05-02 DIAGNOSIS — I447 Left bundle-branch block, unspecified: Secondary | ICD-10-CM

## 2024-05-02 LAB — CUP PACEART INCLINIC DEVICE CHECK
Date Time Interrogation Session: 20260108123200
Implantable Lead Connection Status: 753985
Implantable Lead Connection Status: 753985
Implantable Lead Connection Status: 753985
Implantable Lead Implant Date: 20251229
Implantable Lead Implant Date: 20251229
Implantable Lead Implant Date: 20251229
Implantable Lead Location: 753858
Implantable Lead Location: 753859
Implantable Lead Location: 753860
Implantable Lead Model: 4598
Implantable Lead Model: 5076
Implantable Pulse Generator Implant Date: 20251229

## 2024-05-02 NOTE — Progress Notes (Deleted)
.  nurseov

## 2024-05-02 NOTE — Progress Notes (Signed)
 Normal multi chamber ICD wound check. Wound well healed. Presenting rhythm: AS/VP 76. Routine testing performed. Thresholds, sensing, and impedance consistent with implant measurements. No treated arrhythmias. Reviewed arm restrictions to continue for 6 weeks total post op. Reviewed shock plan. Pt enrolled in remote follow-up.  Programming Changes: - LV output reprogrammed from 2.25V @ 0.37ms to 2.5V @ 0.18ms.

## 2024-05-02 NOTE — Patient Instructions (Signed)
" °  After Your ICD (Implantable Cardiac Defibrillator)  Monitor your defibrillator site for redness, swelling, and drainage. Call the device clinic at [phone] if you experience these symptoms or fever/chills.  Your incision was closed with Steri-strips or staples:  You may shower 7 days after your procedure and wash your incision with soap and water. Avoid lotions, ointments, or perfumes over your incision until it is well-healed.  You may use a hot tub or a pool after your wound check appointment if the incision is completely closed.  Do not lift, push or pull greater than 10 pounds with the affected arm until FEBRUARY 9th. There are no other restrictions in arm movement after your wound check appointment.  Your ICD is designed to protect you from life threatening heart rhythms. Because of this, you may receive a shock.   1 shock with no symptoms:  Call the office during business hours. 1 shock with symptoms (chest pain, chest pressure, dizziness, lightheadedness, shortness of breath, overall feeling unwell):  Call 911. If you experience 2 or more shocks in 24 hours:  Call 911. If you receive a shock, you should not drive.   DMV - no driving for 6 months if you receive appropriate therapy from your ICD.   ICD Alerts:  Some alerts are vibratory and others beep. These are NOT emergencies. Please call our office to let us  know. If this occurs at night or on weekends, it can wait until the next business day. Send a remote transmission.  If your device is capable of reading fluid status (for heart failure), you will be offered monthly monitoring to review this with you.   Remote monitoring is used to monitor your ICD from home. This monitoring is scheduled every 91 days by our office. It allows us  to keep an eye on the functioning of your device to ensure it is working properly. You will routinely see your Electrophysiologist annually (more often if necessary).  "

## 2024-05-07 ENCOUNTER — Encounter: Payer: Self-pay | Admitting: Student

## 2024-05-07 ENCOUNTER — Ambulatory Visit: Payer: Self-pay | Admitting: Student

## 2024-05-07 ENCOUNTER — Telehealth: Payer: Self-pay

## 2024-05-07 VITALS — BP 107/63 | HR 79 | Ht 64.0 in | Wt 199.8 lb

## 2024-05-07 DIAGNOSIS — E119 Type 2 diabetes mellitus without complications: Secondary | ICD-10-CM | POA: Diagnosis not present

## 2024-05-07 LAB — POCT GLYCOSYLATED HEMOGLOBIN (HGB A1C): HbA1c, POC (controlled diabetic range): 9.9 % — AB (ref 0.0–7.0)

## 2024-05-07 MED ORDER — BASAGLAR KWIKPEN 100 UNIT/ML ~~LOC~~ SOPN: 40.0000 [IU] | PEN_INJECTOR | Freq: Every day | SUBCUTANEOUS | Status: AC

## 2024-05-07 NOTE — Progress Notes (Unsigned)
" ° ° °  SUBJECTIVE:   CHIEF COMPLAINT / HPI:   [name] is a 74 y.o. male presenting for diabetes follow up. He is here with his wife.   He recently had a Medtronic ICD defibrillator placed. Recent wound check showed normal healing and device interrogation showed no arrhythmias. He had soreness at the site.  His A1c increased from 7.8% four months ago to 9.9% today. He is on Basaglar  36 units daily and Jardiance  10 mg, and works with the pharmacy team for diabetes management.   PERTINENT  PMH / PSH: reviewed and updated.  OBJECTIVE:   BP 107/63   Pulse 79   Ht 5' 4 (1.626 m)   Wt 199 lb 12.8 oz (90.6 kg)   SpO2 98%   BMI 34.30 kg/m   Well-appearing, no acute distress Cardio: Regular rate, regular rhythm, no murmurs on exam. Pulm: Clear, no wheezing, no crackles. No increased work of breathing Abdominal: bowel sounds present, soft, non-tender, non-distended Extremities: no peripheral edema  Neuro: alert and oriented x3, speech normal in content, no facial asymmetry, strength intact and equal bilaterally in UE and LE, pupils equal and reactive to light.  Psych:  Cognition and judgment appear intact. Alert, communicative  and cooperative with normal attention span and concentration. No apparent delusions, illusions, hallucinations    ASSESSMENT/PLAN:   Assessment & Plan Type 2 diabetes mellitus without complication, without long-term current use of insulin  (HCC) A1c 9.9 today Discussed with Dr. Koval  Will increase Glargine to 40 units daily  Follow up in Feb for CGM data and to adjust insulin .  Goal A1c <7     Damien Pinal, DO [facility]  "

## 2024-05-07 NOTE — Patient Instructions (Addendum)
 It was great to see you today!   Call [phone] to get your insulin  delivered We are increasing your dose of Basaglar  to 40 units daily to help bring down your A1c  Schedule an appointment with Dr. Koval in Feb to go over your sugar results.   Future Appointments  Date Time Provider Department Center  05/27/2024  7:05 AM [facility] HVT DEVICE REMOTES [facility]  07/22/2024  1:55 PM Aniceto Jarvis L, NP [facility]  08/26/2024  7:05 AM [facility] HVT DEVICE REMOTES [facility]  08/26/2024  2:20 PM [facility] ANNUAL WELLNESS VISIT [facility]  11/25/2024  7:05 AM [facility] HVT DEVICE REMOTES [facility]  02/24/2025  7:05 AM [facility] HVT DEVICE REMOTES [facility]  05/26/2025  7:05 AM [facility] HVT DEVICE REMOTES [facility]    Please arrive 15 minutes before your appointment to ensure smooth check in process.  If you are more than 15 minutes late, you may be asked to reschedule.   Please bring a list of your medications with you to all appointments.   Please call the clinic at [phone] if your symptoms worsen or you have any concerns.  Thank you for allowing me to participate in your care, Dr. Damien Pinal [facility]

## 2024-05-07 NOTE — Telephone Encounter (Signed)
 Spoke w/ patient regarding jerking reaction and was unsure if it could have been related to patient device. Patient states he felt jerking reactions & twitch like sensations in both his arms and legs. States he does not notice anything specifically located on his left side. Noticed jerk reactions in extremities starting Friday and continued Saturday saying they would happen intermittently.   Patient does have a CRT-D device. Procedure note states LV voltage tested at 10V w/ no diaphragmatic stimulation noted. STIM was also not noted during wound check appointment.   Jerking reactions not believed to be coming from device. Normal device function. Advised patient to F/U w/ PCP to inquire about what other options could be causing jerking reactions. Verbalized understanding.   Informed to contact device clinic if reactions are noticed again or if he feels any type of stimulation coming from his left side that could be believed to be from his device. Verbalized understanding.   Will continue to monitor and update accordingly.

## 2024-05-07 NOTE — Telephone Encounter (Signed)
 Pt wife left vm stating that pt pcp told pt to reach out to us  because over the weekend pt was having some jerking reaction and they want to make sure it is not device related. Pt no longer has the jerking reaction and does not have any pain associated with it

## 2024-05-15 ENCOUNTER — Other Ambulatory Visit (HOSPITAL_COMMUNITY): Payer: Self-pay

## 2024-05-15 NOTE — Progress Notes (Signed)
 Paramedicine Encounter    Patient ID: [name], male    DOB: 1950-12-12, 74 y.o.   MRN: [id number]   Complaints-denies   Edema-no   Compliance with meds-yes   Pill box filled-yes If so, by whom-wife   Refills needed-none     Pt reports he is doing ok.  He denies increased sob, no dizziness, there was some notes about some jerking motions, but that has resolved. He was told to f/u with PCP. His device was functioning fine.  He reports that when he lays down he gets dizziness. He reports when he gets up it stops, but after he lays down for a few min it goes away. This has been going on for a few wks.  He reports this happens occasionally. Not every night.  He's down a few lbs from when I seen him a couple wks ago.  He is due in march for f/u.  They will call in a few wks to get that set up.  He reports his back is doing ok. Pain is improving.  Able to get around much better.   Meds verified.   Will f/u in a couple wks.   CBG's- 280 BP 122/62   Pulse 78   Resp 15   Wt 193 lb (87.5 kg)   SpO2 97%   BMI 33.13 kg/m  Weight yesterday--192 Last visit weight--197  Patient Care Team: Cleotilde Perkins, DO as PCP - General (Family Medicine) Mona, Vinie BROCKS, MD as PCP - Cardiology (Cardiology) Conception Setter, OD as Consulting Physician (Optometry)  Patient Active Problem List   Diagnosis Date Noted   Lumbar burst fracture (HCC) 12/30/2023   Rib fractures 12/30/2023   Physical deconditioning 10/05/2023   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   Acute on chronic combined systolic and diastolic heart failure (HCC) 09/25/2023   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   Type 2 diabetes mellitus with hyperglycemia, with long-term current use of insulin  (HCC) 09/23/2023   Ischemic heart disease due to coronary artery obstruction 09/22/2023   Type 2 diabetes mellitus with diabetic chronic kidney disease (HCC) 09/21/2023   Coronary artery disease involving native coronary artery of native heart  without angina pectoris    Hypercholesterolemia    Low-level of literacy 05/21/2018   Globus sensation 03/12/2018   Essential hypertension, benign 06/09/2017   Current Medications[1] Allergies[2]    Social History   Socioeconomic History   Marital status: Married    Spouse name: [name]   Number of children: 4   Years of education: 10   Highest education level: 10th grade  Occupational History   Occupation: retired  Tobacco Use   Smoking status: Former    Current packs/day: 0.00    Average packs/day: 0.5 packs/day for 10.0 years (5.0 ttl pk-yrs)    Types: Cigarettes    Start date: 04/26/1983    Quit date: 04/25/1993    Years since quitting: 31.0    Passive exposure: Past   Smokeless tobacco: Never  Vaping Use   Vaping status: Never Used  Substance and Sexual Activity   Alcohol use: No   Drug use: No   Sexual activity: Yes  Other Topics Concern   Not on file  Social History Narrative   Patient lives with his wife in [place].   Patent has 4 boys, one lives in [place] .    Patient enjoys fishing, camping in the woods, working outside, anything outdoor related.   Social Drivers of Health   Tobacco Use: Medium Risk (05/07/2024)  Patient History    Smoking Tobacco Use: Former    Smokeless Tobacco Use: Never    Passive Exposure: Past  Physicist, Medical Strain: Medium Risk (10/31/2023)   Overall Financial Resource Strain (CARDIA)    Difficulty of Paying Living Expenses: Somewhat hard  Food Insecurity: No Food Insecurity (12/31/2023)   Epic    Worried About Programme Researcher, Broadcasting/film/video in the Last Year: Never true    Ran Out of Food in the Last Year: Never true  Recent Concern: Food Insecurity - Food Insecurity Present (10/31/2023)   Epic    Worried About Programme Researcher, Broadcasting/film/video in the Last Year: Sometimes true    Ran Out of Food in the Last Year: Sometimes true  Transportation Needs: No Transportation Needs (12/31/2023)   Epic    Lack of Transportation (Medical): No    Lack of  Transportation (Non-Medical): No  Physical Activity: Inactive (10/31/2023)   Exercise Vital Sign    Days of Exercise per Week: 0 days    Minutes of Exercise per Session: Not on file  Stress: No Stress Concern Present (10/31/2023)   [employer] of Occupational Health - Occupational Stress Questionnaire    Feeling of Stress: Only a little  Social Connections: Moderately Isolated (12/31/2023)   Social Connection and Isolation Panel    Frequency of Communication with Friends and Family: More than three times a week    Frequency of Social Gatherings with Friends and Family: Once a week    Attends Religious Services: Never    Database Administrator or Organizations: No    Attends Banker Meetings: Never    Marital Status: Married  Catering Manager Violence: Not At Risk (12/31/2023)   Epic    Fear of Current or Ex-Partner: No    Emotionally Abused: No    Physically Abused: No    Sexually Abused: No  Depression (PHQ2-9): Low Risk (05/07/2024)   Depression (PHQ2-9)    PHQ-2 Score: 0  Alcohol Screen: Low Risk (08/21/2023)   Alcohol Screen    Last Alcohol Screening Score (AUDIT): 0  Housing: Unknown (12/31/2023)   Epic    Unable to Pay for Housing in the Last Year: No    Number of Times Moved in the Last Year: 0    Homeless in the Last Year: Not on file  Utilities: Not At Risk (12/31/2023)   Epic    Threatened with loss of utilities: No  Health Literacy: Adequate Health Literacy (10/17/2023)   B1300 Health Literacy    Frequency of need for help with medical instructions: Rarely    Physical Exam      Future Appointments  Date Time Provider Department Center  05/27/2024  7:05 AM [facility] DEVICE REMOTES [facility]  07/22/2024  1:55 PM Aniceto Jarvis L, NP [facility]  08/26/2024  7:05 AM [facility] DEVICE REMOTES [facility]  08/26/2024  2:20 PM [facility] ANNUAL WELLNESS VISIT [facility]  11/25/2024  7:05 AM [facility] DEVICE REMOTES [facility]  02/24/2025  7:05 AM [facility] H[facility]  DEVICE REMOTES [facility]  05/26/2025  7:05 AM [facility] DEVICE REMOTES [facility]       Izetta Quivers, Paramedic [phone] [facility]  05/15/24     [1]  Current Outpatient Medications:    Accu-Chek Softclix Lancets lancets, USE AS DIRECTED UP TO FOUR TIMES DAILY, Disp: 100 each, Rfl: 12   aspirin  EC 81 MG tablet, Take 81 mg by mouth daily., Disp: , Rfl:  atorvastatin  (LIPITOR ) 80 MG tablet, Take 1 tablet (80 mg total) by mouth daily., Disp: 90 tablet, Rfl: 0   blood glucose meter kit and supplies, Dispense based on patient and insurance preference. Use up to four times daily as directed. (FOR ICD-10 E10.9, E11.9)., Disp: 1 each, Rfl: 0   carvedilol  (COREG ) 25 MG tablet, Take 25 mg by mouth 2 (two) times daily with a meal., Disp: , Rfl:    clopidogrel  (PLAVIX ) 75 MG tablet, TAKE 1 TABLET(75 MG) BY MOUTH DAILY, Disp: 90 tablet, Rfl: 1   Continuous Glucose Sensor (FREESTYLE LIBRE 3 PLUS SENSOR) MISC, Change sensor every 15 days., Disp: 1 each, Rfl: 3   DROPLET PEN NEEDLES 32G X 4 MM MISC, USE 3 TIMES DAILY AS NEEDED BETWEEN MEALS AND BEDTIME, Disp: 100 each, Rfl: 2   empagliflozin  (JARDIANCE ) 10 MG TABS tablet, Take 1 tablet (10 mg total) by mouth daily before breakfast., Disp: 30 tablet, Rfl: 5   hydrALAZINE  (APRESOLINE ) 50 MG tablet, Take 1.5 tablets (75 mg total) by mouth 3 (three) times daily., Disp: 135 tablet, Rfl: 3   Insulin  Glargine (BASAGLAR  KWIKPEN) 100 UNIT/ML, Inject 40 Units into the skin daily., Disp: , Rfl:    isosorbide  mononitrate (IMDUR ) 30 MG 24 hr tablet, Take 1 tablet (30 mg total) by mouth daily., Disp: 90 tablet, Rfl: 1   ondansetron  (ZOFRAN -ODT) 4 MG disintegrating tablet, Take 1 tablet (4 mg total) by mouth every 8 (eight) hours as needed for nausea or vomiting., Disp: , Rfl:    senna (SENOKOT) 8.6 MG tablet, Take 1 tablet by mouth as needed for constipation., Disp: , Rfl:    torsemide  (DEMADEX ) 20 MG tablet, Take 2 tablets (40 mg total) by  mouth daily., Disp: 60 tablet, Rfl: 5   Blood Glucose Monitoring Suppl (ACCU-CHEK GUIDE ME) w/Device KIT, Please use to check blood sugar level up to four times daily., Disp: 1 kit, Rfl: 0   cyclobenzaprine  (FLEXERIL ) 10 MG tablet, Take 0.5 tablets (5 mg total) by mouth 2 (two) times daily as needed for muscle spasms., Disp: 10 tablet, Rfl: 0   Fe Fum-Vit C-Vit B12-FA (TRIGELS-F FORTE PO), Take 1 tablet by mouth every Monday, Wednesday, and Friday., Disp: , Rfl:    feeding supplement (ENSURE PLUS HIGH PROTEIN) LIQD, Take 237 mLs by mouth 2 (two) times daily between meals., Disp: , Rfl:    glucose blood (ACCU-CHEK GUIDE TEST) test strip, Please use to check blood sugar up to 4 times daily., Disp: 100 each, Rfl: 12   lidocaine  (LIDODERM ) 5 %, Place 1 patch onto the skin daily. Remove & Discard patch within 12 hours or as directed by MD, Disp: 14 patch, Rfl: 0   nitroGLYCERIN  (NITROSTAT ) 0.3 MG SL tablet, Place 1 tablet (0.3 mg total) under the tongue every 5 (five) minutes as needed for chest pain., Disp: 90 tablet, Rfl: 0 [2]  Allergies Allergen Reactions   Trulicity [Dulaglutide] Other (See Comments)    Patient indicated this caused pancreatitis.

## 2024-05-27 ENCOUNTER — Ambulatory Visit

## 2024-05-27 DIAGNOSIS — I5022 Chronic systolic (congestive) heart failure: Secondary | ICD-10-CM

## 2024-05-28 LAB — CUP PACEART REMOTE DEVICE CHECK
Battery Remaining Longevity: 128 mo
Battery Voltage: 3.17 V
Brady Statistic AP VP Percent: 0.1 %
Brady Statistic AP VS Percent: 0.02 %
Brady Statistic AS VP Percent: 96.95 %
Brady Statistic AS VS Percent: 2.93 %
Brady Statistic RA Percent Paced: 0.15 %
Brady Statistic RV Percent Paced: 0.13 %
Date Time Interrogation Session: 20260201232741
HighPow Impedance: 49 Ohm
Implantable Lead Connection Status: 753985
Implantable Lead Connection Status: 753985
Implantable Lead Connection Status: 753985
Implantable Lead Implant Date: 20251229
Implantable Lead Implant Date: 20251229
Implantable Lead Implant Date: 20251229
Implantable Lead Location: 753858
Implantable Lead Location: 753859
Implantable Lead Location: 753860
Implantable Lead Model: 4598
Implantable Lead Model: 5076
Implantable Pulse Generator Implant Date: 20251229
Lead Channel Impedance Value: 1045 Ohm
Lead Channel Impedance Value: 1121 Ohm
Lead Channel Impedance Value: 1121 Ohm
Lead Channel Impedance Value: 1216 Ohm
Lead Channel Impedance Value: 380 Ohm
Lead Channel Impedance Value: 475 Ohm
Lead Channel Impedance Value: 475 Ohm
Lead Channel Impedance Value: 513 Ohm
Lead Channel Impedance Value: 646 Ohm
Lead Channel Impedance Value: 665 Ohm
Lead Channel Impedance Value: 722 Ohm
Lead Channel Impedance Value: 855 Ohm
Lead Channel Impedance Value: 969 Ohm
Lead Channel Pacing Threshold Amplitude: 0.625 V
Lead Channel Pacing Threshold Amplitude: 0.75 V
Lead Channel Pacing Threshold Amplitude: 1.125 V
Lead Channel Pacing Threshold Pulse Width: 0.4 ms
Lead Channel Pacing Threshold Pulse Width: 0.4 ms
Lead Channel Pacing Threshold Pulse Width: 0.6 ms
Lead Channel Sensing Intrinsic Amplitude: 1.9 mV
Lead Channel Sensing Intrinsic Amplitude: 8.6 mV
Lead Channel Setting Pacing Amplitude: 1.75 V
Lead Channel Setting Pacing Amplitude: 3 V
Lead Channel Setting Pacing Amplitude: 3 V
Lead Channel Setting Pacing Pulse Width: 0.4 ms
Lead Channel Setting Pacing Pulse Width: 0.6 ms
Lead Channel Setting Sensing Sensitivity: 0.3 mV
Zone Setting Status: 755011
Zone Setting Status: 755011

## 2024-05-31 ENCOUNTER — Ambulatory Visit: Payer: Self-pay | Admitting: Cardiology

## 2024-05-31 ENCOUNTER — Other Ambulatory Visit (HOSPITAL_COMMUNITY): Payer: Self-pay

## 2024-05-31 NOTE — Progress Notes (Signed)
 Remote ICD Transmission

## 2024-05-31 NOTE — Progress Notes (Signed)
 Paramedicine Encounter    Patient ID: [name], male    DOB: Jun 08, 1950, 74 y.o.   MRN: [id number]   Complaints-back pain off and on   Edema-none   Compliance with meds-yes  Pill box filled-yes  If so, by whom-wife handles that   Refills needed-none   Pt reports he's doing ok. He denies increased sob, no dizziness, no c/p, no edema. Device still doing well, no issues or concerns. Appetite ok.  No bleeding issues. No edema noted.  His back is doing better, but still gets sore and painful at times.  Maybe getting worse with the cold weather.   He is due for clinic appoint in mar so wife will call to get that set up.  Will f/u in 2-3 wks.   CBG-206   BP 138/70   Pulse 76   Resp 16   Wt 195 lb (88.5 kg)   SpO2 96%   BMI 33.47 kg/m  Weight yesterday-195 Last visit weight---193  Patient Care Team: Cleotilde Perkins, DO as PCP - General (Family Medicine) Mona, Vinie BROCKS, MD as PCP - Cardiology (Cardiology) Conception Setter, OD as Consulting Physician (Optometry)  Patient Active Problem List   Diagnosis Date Noted   Lumbar burst fracture (HCC) 12/30/2023   Rib fractures 12/30/2023   Physical deconditioning 10/05/2023   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   Acute on chronic combined systolic and diastolic heart failure (HCC) 09/25/2023   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   Type 2 diabetes mellitus with hyperglycemia, with long-term current use of insulin  (HCC) 09/23/2023   Ischemic heart disease due to coronary artery obstruction 09/22/2023   Type 2 diabetes mellitus with diabetic chronic kidney disease (HCC) 09/21/2023   Coronary artery disease involving native coronary artery of native heart without angina pectoris    Hypercholesterolemia    Low-level of literacy 05/21/2018   Globus sensation 03/12/2018   Essential hypertension, benign 06/09/2017   Current Medications[1] Allergies[2]    Social History   Socioeconomic History   Marital status: Married    Spouse name:  [name]   Number of children: 4   Years of education: 10   Highest education level: 10th grade  Occupational History   Occupation: retired  Tobacco Use   Smoking status: Former    Current packs/day: 0.00    Average packs/day: 0.5 packs/day for 10.0 years (5.0 ttl pk-yrs)    Types: Cigarettes    Start date: 04/26/1983    Quit date: 04/25/1993    Years since quitting: 31.1    Passive exposure: Past   Smokeless tobacco: Never  Vaping Use   Vaping status: Never Used  Substance and Sexual Activity   Alcohol use: No   Drug use: No   Sexual activity: Yes  Other Topics Concern   Not on file  Social History Narrative   Patient lives with his wife in [place].   Patent has 4 boys, one lives in [place] .    Patient enjoys fishing, camping in the woods, working outside, anything outdoor related.   Social Drivers of Health   Tobacco Use: Medium Risk (05/07/2024)   Patient History    Smoking Tobacco Use: Former    Smokeless Tobacco Use: Never    Passive Exposure: Past  Physicist, Medical Strain: Medium Risk (10/31/2023)   Overall Financial Resource Strain (CARDIA)    Difficulty of Paying Living Expenses: Somewhat hard  Food Insecurity: No Food Insecurity (12/31/2023)   Epic    Worried About Running Out of Food in the Last  Year: Never true    Ran Out of Food in the Last Year: Never true  Recent Concern: Food Insecurity - Food Insecurity Present (10/31/2023)   Epic    Worried About Programme Researcher, Broadcasting/film/video in the Last Year: Sometimes true    Ran Out of Food in the Last Year: Sometimes true  Transportation Needs: No Transportation Needs (12/31/2023)   Epic    Lack of Transportation (Medical): No    Lack of Transportation (Non-Medical): No  Physical Activity: Inactive (10/31/2023)   Exercise Vital Sign    Days of Exercise per Week: 0 days    Minutes of Exercise per Session: Not on file  Stress: No Stress Concern Present (10/31/2023)   [employer] of Occupational Health - Occupational Stress  Questionnaire    Feeling of Stress: Only a little  Social Connections: Moderately Isolated (12/31/2023)   Social Connection and Isolation Panel    Frequency of Communication with Friends and Family: More than three times a week    Frequency of Social Gatherings with Friends and Family: Once a week    Attends Religious Services: Never    Database Administrator or Organizations: No    Attends Banker Meetings: Never    Marital Status: Married  Catering Manager Violence: Not At Risk (12/31/2023)   Epic    Fear of Current or Ex-Partner: No    Emotionally Abused: No    Physically Abused: No    Sexually Abused: No  Depression (PHQ2-9): Low Risk (05/07/2024)   Depression (PHQ2-9)    PHQ-2 Score: 0  Alcohol Screen: Low Risk (08/21/2023)   Alcohol Screen    Last Alcohol Screening Score (AUDIT): 0  Housing: Unknown (12/31/2023)   Epic    Unable to Pay for Housing in the Last Year: No    Number of Times Moved in the Last Year: 0    Homeless in the Last Year: Not on file  Utilities: Not At Risk (12/31/2023)   Epic    Threatened with loss of utilities: No  Health Literacy: Adequate Health Literacy (10/17/2023)   B1300 Health Literacy    Frequency of need for help with medical instructions: Rarely    Physical Exam      Future Appointments  Date Time Provider Department Center  07/22/2024  1:55 PM Aniceto Daphne CROME, NP [facility]  08/26/2024  7:05 AM [facility] H[facility] DEVICE REMOTES [facility]  08/26/2024  2:20 PM [facility] ANNUAL WELLNESS VISIT [facility]  11/25/2024  7:05 AM [facility] DEVICE REMOTES [facility]  02/24/2025  7:05 AM [facility] DEVICE REMOTES [facility]  05/26/2025  7:05 AM [facility] DEVICE REMOTES [facility]       Izetta Quivers, Paramedic [phone] [facility]  05/31/24    [1]  Current Outpatient Medications:    Accu-Chek Softclix Lancets lancets, USE AS DIRECTED UP TO FOUR TIMES DAILY, Disp: 100 each, Rfl: 12   aspirin  EC 81 MG tablet, Take 81  mg by mouth daily., Disp: , Rfl:    atorvastatin  (LIPITOR ) 80 MG tablet, Take 1 tablet (80 mg total) by mouth daily., Disp: 90 tablet, Rfl: 0   blood glucose meter kit and supplies, Dispense based on patient and insurance preference. Use up to four times daily as directed. (FOR ICD-10 E10.9, E11.9)., Disp: 1 each, Rfl: 0   Blood Glucose Monitoring Suppl (ACCU-CHEK GUIDE ME) w/Device KIT, Please use to check blood sugar level up to four times daily., Disp: 1 kit, Rfl:  0   carvedilol  (COREG ) 25 MG tablet, Take 25 mg by mouth 2 (two) times daily with a meal., Disp: , Rfl:    clopidogrel  (PLAVIX ) 75 MG tablet, TAKE 1 TABLET(75 MG) BY MOUTH DAILY, Disp: 90 tablet, Rfl: 1   Continuous Glucose Sensor (FREESTYLE LIBRE 3 PLUS SENSOR) MISC, Change sensor every 15 days., Disp: 1 each, Rfl: 3   cyclobenzaprine  (FLEXERIL ) 10 MG tablet, Take 0.5 tablets (5 mg total) by mouth 2 (two) times daily as needed for muscle spasms., Disp: 10 tablet, Rfl: 0   DROPLET PEN NEEDLES 32G X 4 MM MISC, USE 3 TIMES DAILY AS NEEDED BETWEEN MEALS AND BEDTIME, Disp: 100 each, Rfl: 2   empagliflozin  (JARDIANCE ) 10 MG TABS tablet, Take 1 tablet (10 mg total) by mouth daily before breakfast., Disp: 30 tablet, Rfl: 5   Fe Fum-Vit C-Vit B12-FA (TRIGELS-F FORTE PO), Take 1 tablet by mouth every Monday, Wednesday, and Friday., Disp: , Rfl:    feeding supplement (ENSURE PLUS HIGH PROTEIN) LIQD, Take 237 mLs by mouth 2 (two) times daily between meals., Disp: , Rfl:    glucose blood (ACCU-CHEK GUIDE TEST) test strip, Please use to check blood sugar up to 4 times daily., Disp: 100 each, Rfl: 12   hydrALAZINE  (APRESOLINE ) 50 MG tablet, Take 1.5 tablets (75 mg total) by mouth 3 (three) times daily., Disp: 135 tablet, Rfl: 3   Insulin  Glargine (BASAGLAR  KWIKPEN) 100 UNIT/ML, Inject 40 Units into the skin daily., Disp: , Rfl:    isosorbide  mononitrate (IMDUR ) 30 MG 24 hr tablet, Take 1 tablet (30 mg total) by mouth daily., Disp: 90 tablet, Rfl: 1    lidocaine  (LIDODERM ) 5 %, Place 1 patch onto the skin daily. Remove & Discard patch within 12 hours or as directed by MD, Disp: 14 patch, Rfl: 0   nitroGLYCERIN  (NITROSTAT ) 0.3 MG SL tablet, Place 1 tablet (0.3 mg total) under the tongue every 5 (five) minutes as needed for chest pain., Disp: 90 tablet, Rfl: 0   ondansetron  (ZOFRAN -ODT) 4 MG disintegrating tablet, Take 1 tablet (4 mg total) by mouth every 8 (eight) hours as needed for nausea or vomiting., Disp: , Rfl:    senna (SENOKOT) 8.6 MG tablet, Take 1 tablet by mouth as needed for constipation., Disp: , Rfl:    torsemide  (DEMADEX ) 20 MG tablet, Take 2 tablets (40 mg total) by mouth daily., Disp: 60 tablet, Rfl: 5 [2]  Allergies Allergen Reactions   Trulicity [Dulaglutide] Other (See Comments)    Patient indicated this caused pancreatitis.

## 2024-07-22 ENCOUNTER — Ambulatory Visit: Admitting: Pulmonary Disease

## 2024-08-26 ENCOUNTER — Encounter

## 2024-08-26 ENCOUNTER — Ambulatory Visit

## 2024-11-25 ENCOUNTER — Ambulatory Visit

## 2025-02-24 ENCOUNTER — Ambulatory Visit

## 2025-05-26 ENCOUNTER — Ambulatory Visit
